# Patient Record
Sex: Female | Born: 1945
Health system: Southern US, Community
[De-identification: ages and names within clinical notes are randomized; demographics above are authoritative.]

## PROBLEM LIST (undated history)

## (undated) DIAGNOSIS — IMO0001 Reserved for inherently not codable concepts without codable children: Secondary | ICD-10-CM

## (undated) DIAGNOSIS — D649 Anemia, unspecified: Secondary | ICD-10-CM

## (undated) DIAGNOSIS — I10 Essential (primary) hypertension: Secondary | ICD-10-CM

## (undated) DIAGNOSIS — T7840XA Allergy, unspecified, initial encounter: Secondary | ICD-10-CM

## (undated) DIAGNOSIS — M3500 Sicca syndrome, unspecified: Secondary | ICD-10-CM

## (undated) DIAGNOSIS — E119 Type 2 diabetes mellitus without complications: Secondary | ICD-10-CM

## (undated) DIAGNOSIS — Z789 Other specified health status: Secondary | ICD-10-CM

## (undated) DIAGNOSIS — I350 Nonrheumatic aortic (valve) stenosis: Secondary | ICD-10-CM

## (undated) DIAGNOSIS — M1990 Unspecified osteoarthritis, unspecified site: Secondary | ICD-10-CM

## (undated) DIAGNOSIS — D369 Benign neoplasm, unspecified site: Secondary | ICD-10-CM

## (undated) DIAGNOSIS — R110 Nausea: Secondary | ICD-10-CM

## (undated) DIAGNOSIS — E079 Disorder of thyroid, unspecified: Secondary | ICD-10-CM

## (undated) HISTORY — PX: OTHER SURGICAL HISTORY: SHX169

## (undated) HISTORY — DX: Disorder of thyroid, unspecified: E07.9

## (undated) HISTORY — DX: Allergy, unspecified, initial encounter: T78.40XA

## (undated) HISTORY — DX: Benign neoplasm, unspecified site: D36.9

## (undated) HISTORY — DX: Nonrheumatic aortic (valve) stenosis: I35.0

## (undated) HISTORY — DX: Sjogren syndrome, unspecified: M35.00

## (undated) HISTORY — PX: THYROIDECTOMY: SHX17

## (undated) HISTORY — DX: Other specified health status: Z78.9

## (undated) HISTORY — DX: Type 2 diabetes mellitus without complications: E11.9

## (undated) HISTORY — PX: COLON SURGERY: SHX602

## (undated) HISTORY — DX: Anemia, unspecified: D64.9

## (undated) HISTORY — DX: Reserved for inherently not codable concepts without codable children: IMO0001

## (undated) HISTORY — DX: Nausea: R11.0

## (undated) HISTORY — PX: COLONOSCOPY: SHX174

---

## 2004-08-15 ENCOUNTER — Ambulatory Visit: Payer: Self-pay | Admitting: Internal Medicine

## 2004-09-03 ENCOUNTER — Ambulatory Visit: Payer: Self-pay | Admitting: Internal Medicine

## 2004-10-04 ENCOUNTER — Ambulatory Visit: Payer: Self-pay | Admitting: Internal Medicine

## 2004-11-20 HISTORY — PX: PARATHYROIDECTOMY: SHX19

## 2005-09-16 ENCOUNTER — Ambulatory Visit: Payer: Self-pay | Admitting: Internal Medicine

## 2007-01-22 ENCOUNTER — Ambulatory Visit: Payer: Self-pay | Admitting: Family Medicine

## 2007-07-15 ENCOUNTER — Ambulatory Visit: Payer: Self-pay | Admitting: Family Medicine

## 2010-10-24 ENCOUNTER — Ambulatory Visit: Payer: Self-pay

## 2011-08-19 DIAGNOSIS — R5383 Other fatigue: Secondary | ICD-10-CM | POA: Diagnosis not present

## 2011-08-19 DIAGNOSIS — E119 Type 2 diabetes mellitus without complications: Secondary | ICD-10-CM | POA: Diagnosis not present

## 2011-08-19 DIAGNOSIS — E785 Hyperlipidemia, unspecified: Secondary | ICD-10-CM | POA: Diagnosis not present

## 2011-08-19 DIAGNOSIS — I10 Essential (primary) hypertension: Secondary | ICD-10-CM | POA: Diagnosis not present

## 2011-08-20 DIAGNOSIS — I10 Essential (primary) hypertension: Secondary | ICD-10-CM | POA: Diagnosis not present

## 2011-08-20 DIAGNOSIS — R5383 Other fatigue: Secondary | ICD-10-CM | POA: Diagnosis not present

## 2011-08-20 DIAGNOSIS — E119 Type 2 diabetes mellitus without complications: Secondary | ICD-10-CM | POA: Diagnosis not present

## 2011-09-03 DIAGNOSIS — E785 Hyperlipidemia, unspecified: Secondary | ICD-10-CM | POA: Diagnosis not present

## 2011-10-02 DIAGNOSIS — E785 Hyperlipidemia, unspecified: Secondary | ICD-10-CM | POA: Diagnosis not present

## 2011-10-02 DIAGNOSIS — M1990 Unspecified osteoarthritis, unspecified site: Secondary | ICD-10-CM | POA: Diagnosis not present

## 2011-11-29 DIAGNOSIS — E119 Type 2 diabetes mellitus without complications: Secondary | ICD-10-CM | POA: Diagnosis not present

## 2011-12-03 DIAGNOSIS — I10 Essential (primary) hypertension: Secondary | ICD-10-CM | POA: Diagnosis not present

## 2011-12-03 DIAGNOSIS — E119 Type 2 diabetes mellitus without complications: Secondary | ICD-10-CM | POA: Diagnosis not present

## 2012-01-01 DIAGNOSIS — E119 Type 2 diabetes mellitus without complications: Secondary | ICD-10-CM | POA: Diagnosis not present

## 2012-01-01 DIAGNOSIS — M1990 Unspecified osteoarthritis, unspecified site: Secondary | ICD-10-CM | POA: Diagnosis not present

## 2012-01-01 DIAGNOSIS — I10 Essential (primary) hypertension: Secondary | ICD-10-CM | POA: Diagnosis not present

## 2012-02-19 DIAGNOSIS — Z23 Encounter for immunization: Secondary | ICD-10-CM | POA: Diagnosis not present

## 2012-03-19 DIAGNOSIS — I10 Essential (primary) hypertension: Secondary | ICD-10-CM | POA: Diagnosis not present

## 2012-03-19 DIAGNOSIS — E119 Type 2 diabetes mellitus without complications: Secondary | ICD-10-CM | POA: Diagnosis not present

## 2012-03-19 DIAGNOSIS — E7800 Pure hypercholesterolemia, unspecified: Secondary | ICD-10-CM | POA: Diagnosis not present

## 2012-03-31 DIAGNOSIS — M129 Arthropathy, unspecified: Secondary | ICD-10-CM | POA: Diagnosis not present

## 2012-03-31 DIAGNOSIS — E785 Hyperlipidemia, unspecified: Secondary | ICD-10-CM | POA: Diagnosis not present

## 2012-03-31 DIAGNOSIS — I10 Essential (primary) hypertension: Secondary | ICD-10-CM | POA: Diagnosis not present

## 2012-03-31 DIAGNOSIS — E119 Type 2 diabetes mellitus without complications: Secondary | ICD-10-CM | POA: Diagnosis not present

## 2012-06-29 DIAGNOSIS — E11329 Type 2 diabetes mellitus with mild nonproliferative diabetic retinopathy without macular edema: Secondary | ICD-10-CM | POA: Diagnosis not present

## 2012-07-01 DIAGNOSIS — Z86010 Personal history of colonic polyps: Secondary | ICD-10-CM | POA: Diagnosis not present

## 2012-07-01 DIAGNOSIS — E119 Type 2 diabetes mellitus without complications: Secondary | ICD-10-CM | POA: Diagnosis not present

## 2012-07-01 DIAGNOSIS — E785 Hyperlipidemia, unspecified: Secondary | ICD-10-CM | POA: Diagnosis not present

## 2012-07-01 DIAGNOSIS — I10 Essential (primary) hypertension: Secondary | ICD-10-CM | POA: Diagnosis not present

## 2012-07-01 DIAGNOSIS — R1310 Dysphagia, unspecified: Secondary | ICD-10-CM | POA: Diagnosis not present

## 2012-07-08 DIAGNOSIS — E119 Type 2 diabetes mellitus without complications: Secondary | ICD-10-CM | POA: Diagnosis not present

## 2012-07-08 DIAGNOSIS — I10 Essential (primary) hypertension: Secondary | ICD-10-CM | POA: Diagnosis not present

## 2012-07-08 DIAGNOSIS — R1310 Dysphagia, unspecified: Secondary | ICD-10-CM | POA: Diagnosis not present

## 2012-07-13 ENCOUNTER — Ambulatory Visit: Payer: Self-pay | Admitting: Unknown Physician Specialty

## 2012-07-13 DIAGNOSIS — Z880 Allergy status to penicillin: Secondary | ICD-10-CM | POA: Diagnosis not present

## 2012-07-13 DIAGNOSIS — J45909 Unspecified asthma, uncomplicated: Secondary | ICD-10-CM | POA: Diagnosis not present

## 2012-07-13 DIAGNOSIS — Z9889 Other specified postprocedural states: Secondary | ICD-10-CM | POA: Diagnosis not present

## 2012-07-13 DIAGNOSIS — K449 Diaphragmatic hernia without obstruction or gangrene: Secondary | ICD-10-CM | POA: Diagnosis not present

## 2012-07-13 DIAGNOSIS — R1310 Dysphagia, unspecified: Secondary | ICD-10-CM | POA: Diagnosis not present

## 2012-07-13 DIAGNOSIS — E119 Type 2 diabetes mellitus without complications: Secondary | ICD-10-CM | POA: Diagnosis not present

## 2012-07-13 DIAGNOSIS — Z79899 Other long term (current) drug therapy: Secondary | ICD-10-CM | POA: Diagnosis not present

## 2012-07-13 DIAGNOSIS — K219 Gastro-esophageal reflux disease without esophagitis: Secondary | ICD-10-CM | POA: Diagnosis not present

## 2012-07-13 DIAGNOSIS — I10 Essential (primary) hypertension: Secondary | ICD-10-CM | POA: Diagnosis not present

## 2012-07-15 LAB — PATHOLOGY REPORT

## 2012-08-05 DIAGNOSIS — E119 Type 2 diabetes mellitus without complications: Secondary | ICD-10-CM | POA: Diagnosis not present

## 2012-08-05 DIAGNOSIS — I10 Essential (primary) hypertension: Secondary | ICD-10-CM | POA: Diagnosis not present

## 2013-01-11 ENCOUNTER — Ambulatory Visit: Payer: Self-pay | Admitting: Unknown Physician Specialty

## 2013-01-11 DIAGNOSIS — Z880 Allergy status to penicillin: Secondary | ICD-10-CM | POA: Diagnosis not present

## 2013-01-11 DIAGNOSIS — E079 Disorder of thyroid, unspecified: Secondary | ICD-10-CM | POA: Diagnosis not present

## 2013-01-11 DIAGNOSIS — Z09 Encounter for follow-up examination after completed treatment for conditions other than malignant neoplasm: Secondary | ICD-10-CM | POA: Diagnosis not present

## 2013-01-11 DIAGNOSIS — Z86010 Personal history of colonic polyps: Secondary | ICD-10-CM | POA: Diagnosis not present

## 2013-01-11 DIAGNOSIS — Z85038 Personal history of other malignant neoplasm of large intestine: Secondary | ICD-10-CM | POA: Diagnosis not present

## 2013-01-11 DIAGNOSIS — Z6841 Body Mass Index (BMI) 40.0 and over, adult: Secondary | ICD-10-CM | POA: Diagnosis not present

## 2013-01-11 DIAGNOSIS — K648 Other hemorrhoids: Secondary | ICD-10-CM | POA: Diagnosis not present

## 2013-01-11 DIAGNOSIS — M1990 Unspecified osteoarthritis, unspecified site: Secondary | ICD-10-CM | POA: Diagnosis not present

## 2013-01-11 DIAGNOSIS — Z0389 Encounter for observation for other suspected diseases and conditions ruled out: Secondary | ICD-10-CM | POA: Diagnosis not present

## 2013-01-11 DIAGNOSIS — K5730 Diverticulosis of large intestine without perforation or abscess without bleeding: Secondary | ICD-10-CM | POA: Diagnosis not present

## 2013-01-11 DIAGNOSIS — Z87891 Personal history of nicotine dependence: Secondary | ICD-10-CM | POA: Diagnosis not present

## 2013-01-11 DIAGNOSIS — Z79899 Other long term (current) drug therapy: Secondary | ICD-10-CM | POA: Diagnosis not present

## 2013-01-11 DIAGNOSIS — I10 Essential (primary) hypertension: Secondary | ICD-10-CM | POA: Diagnosis not present

## 2013-01-11 DIAGNOSIS — J45909 Unspecified asthma, uncomplicated: Secondary | ICD-10-CM | POA: Diagnosis not present

## 2013-01-11 DIAGNOSIS — E7800 Pure hypercholesterolemia, unspecified: Secondary | ICD-10-CM | POA: Diagnosis not present

## 2013-01-11 LAB — HM COLONOSCOPY

## 2013-04-05 DIAGNOSIS — Z23 Encounter for immunization: Secondary | ICD-10-CM | POA: Diagnosis not present

## 2013-08-16 DIAGNOSIS — E119 Type 2 diabetes mellitus without complications: Secondary | ICD-10-CM | POA: Diagnosis not present

## 2013-08-16 DIAGNOSIS — I10 Essential (primary) hypertension: Secondary | ICD-10-CM | POA: Diagnosis not present

## 2013-08-16 DIAGNOSIS — E785 Hyperlipidemia, unspecified: Secondary | ICD-10-CM | POA: Diagnosis not present

## 2013-08-16 DIAGNOSIS — R1310 Dysphagia, unspecified: Secondary | ICD-10-CM | POA: Diagnosis not present

## 2014-02-09 DIAGNOSIS — Z23 Encounter for immunization: Secondary | ICD-10-CM | POA: Diagnosis not present

## 2014-04-13 ENCOUNTER — Ambulatory Visit: Payer: Self-pay | Admitting: Family Medicine

## 2014-04-13 DIAGNOSIS — Z1231 Encounter for screening mammogram for malignant neoplasm of breast: Secondary | ICD-10-CM | POA: Diagnosis not present

## 2014-04-26 DIAGNOSIS — Z008 Encounter for other general examination: Secondary | ICD-10-CM | POA: Diagnosis not present

## 2014-05-18 ENCOUNTER — Ambulatory Visit: Payer: Self-pay | Admitting: Family Medicine

## 2014-05-18 DIAGNOSIS — Z13820 Encounter for screening for osteoporosis: Secondary | ICD-10-CM | POA: Diagnosis not present

## 2014-05-30 DIAGNOSIS — E785 Hyperlipidemia, unspecified: Secondary | ICD-10-CM | POA: Diagnosis not present

## 2014-05-30 DIAGNOSIS — R1310 Dysphagia, unspecified: Secondary | ICD-10-CM | POA: Diagnosis not present

## 2014-05-30 DIAGNOSIS — Z1389 Encounter for screening for other disorder: Secondary | ICD-10-CM | POA: Diagnosis not present

## 2014-05-30 DIAGNOSIS — E119 Type 2 diabetes mellitus without complications: Secondary | ICD-10-CM | POA: Diagnosis not present

## 2014-05-30 DIAGNOSIS — I10 Essential (primary) hypertension: Secondary | ICD-10-CM | POA: Diagnosis not present

## 2014-05-30 DIAGNOSIS — Z9181 History of falling: Secondary | ICD-10-CM | POA: Diagnosis not present

## 2014-09-01 DIAGNOSIS — E785 Hyperlipidemia, unspecified: Secondary | ICD-10-CM | POA: Diagnosis not present

## 2014-09-01 DIAGNOSIS — K219 Gastro-esophageal reflux disease without esophagitis: Secondary | ICD-10-CM | POA: Diagnosis not present

## 2014-09-01 DIAGNOSIS — R6889 Other general symptoms and signs: Secondary | ICD-10-CM | POA: Diagnosis not present

## 2014-09-01 DIAGNOSIS — M1990 Unspecified osteoarthritis, unspecified site: Secondary | ICD-10-CM | POA: Diagnosis not present

## 2014-09-01 DIAGNOSIS — E119 Type 2 diabetes mellitus without complications: Secondary | ICD-10-CM | POA: Diagnosis not present

## 2014-09-01 DIAGNOSIS — I10 Essential (primary) hypertension: Secondary | ICD-10-CM | POA: Diagnosis not present

## 2014-09-01 LAB — HEMOGLOBIN A1C: Hgb A1c MFr Bld: 7 % — AB (ref 4.0–6.0)

## 2014-09-13 DIAGNOSIS — M25662 Stiffness of left knee, not elsewhere classified: Secondary | ICD-10-CM | POA: Diagnosis not present

## 2014-09-13 DIAGNOSIS — M25561 Pain in right knee: Secondary | ICD-10-CM | POA: Diagnosis not present

## 2014-09-13 DIAGNOSIS — M25661 Stiffness of right knee, not elsewhere classified: Secondary | ICD-10-CM | POA: Diagnosis not present

## 2014-09-13 DIAGNOSIS — M25562 Pain in left knee: Secondary | ICD-10-CM | POA: Diagnosis not present

## 2014-09-14 DIAGNOSIS — M25561 Pain in right knee: Secondary | ICD-10-CM | POA: Diagnosis not present

## 2014-09-14 DIAGNOSIS — M25562 Pain in left knee: Secondary | ICD-10-CM | POA: Diagnosis not present

## 2014-09-14 DIAGNOSIS — M25662 Stiffness of left knee, not elsewhere classified: Secondary | ICD-10-CM | POA: Diagnosis not present

## 2014-09-14 DIAGNOSIS — M25661 Stiffness of right knee, not elsewhere classified: Secondary | ICD-10-CM | POA: Diagnosis not present

## 2014-09-19 DIAGNOSIS — M25661 Stiffness of right knee, not elsewhere classified: Secondary | ICD-10-CM | POA: Diagnosis not present

## 2014-09-19 DIAGNOSIS — M25562 Pain in left knee: Secondary | ICD-10-CM | POA: Diagnosis not present

## 2014-09-19 DIAGNOSIS — E119 Type 2 diabetes mellitus without complications: Secondary | ICD-10-CM | POA: Diagnosis not present

## 2014-09-19 DIAGNOSIS — E785 Hyperlipidemia, unspecified: Secondary | ICD-10-CM | POA: Diagnosis not present

## 2014-09-19 DIAGNOSIS — M25561 Pain in right knee: Secondary | ICD-10-CM | POA: Diagnosis not present

## 2014-09-19 DIAGNOSIS — K219 Gastro-esophageal reflux disease without esophagitis: Secondary | ICD-10-CM | POA: Diagnosis not present

## 2014-09-19 DIAGNOSIS — M25662 Stiffness of left knee, not elsewhere classified: Secondary | ICD-10-CM | POA: Diagnosis not present

## 2014-09-19 LAB — TSH: TSH: 2.43 u[IU]/mL (ref <=5.90)

## 2014-09-19 LAB — CBC AND DIFFERENTIAL
HCT: 30 % — AB (ref 36–46)
Hemoglobin: 9 g/dL — AB (ref 12.0–16.0)
Neutrophils Absolute: 2 /uL
Platelets: 335 10*3/uL (ref 150–399)
WBC: 3.8 10^3/mL

## 2014-09-19 LAB — LIPID PANEL
Cholesterol: 180 mg/dL (ref 0–200)
HDL: 63 mg/dL (ref 35–70)
LDL CALC: 97 mg/dL
Triglycerides: 98 mg/dL (ref 40–160)

## 2014-09-19 LAB — HEPATIC FUNCTION PANEL
ALK PHOS: 69 U/L (ref 25–125)
ALT: 44 U/L — AB (ref 7–35)
AST: 73 U/L — AB (ref 13–35)
Bilirubin, Total: 0.3 mg/dL

## 2014-09-19 LAB — BASIC METABOLIC PANEL
BUN: 19 mg/dL (ref 4–21)
Creatinine: 0.9 mg/dL (ref <=1.1)
GLUCOSE: 204 mg/dL
POTASSIUM: 4.5 mmol/L (ref 3.4–5.3)

## 2014-09-21 DIAGNOSIS — M25562 Pain in left knee: Secondary | ICD-10-CM | POA: Diagnosis not present

## 2014-09-21 DIAGNOSIS — M25561 Pain in right knee: Secondary | ICD-10-CM | POA: Diagnosis not present

## 2014-09-21 DIAGNOSIS — M25661 Stiffness of right knee, not elsewhere classified: Secondary | ICD-10-CM | POA: Diagnosis not present

## 2014-09-21 DIAGNOSIS — M25662 Stiffness of left knee, not elsewhere classified: Secondary | ICD-10-CM | POA: Diagnosis not present

## 2014-09-23 LAB — FECAL OCCULT BLOOD, GUAIAC: FECAL OCCULT BLD: NEGATIVE

## 2014-10-05 DIAGNOSIS — M25661 Stiffness of right knee, not elsewhere classified: Secondary | ICD-10-CM | POA: Diagnosis not present

## 2014-10-05 DIAGNOSIS — M25662 Stiffness of left knee, not elsewhere classified: Secondary | ICD-10-CM | POA: Diagnosis not present

## 2014-10-05 DIAGNOSIS — R945 Abnormal results of liver function studies: Secondary | ICD-10-CM | POA: Diagnosis not present

## 2014-10-05 DIAGNOSIS — M25562 Pain in left knee: Secondary | ICD-10-CM | POA: Diagnosis not present

## 2014-10-05 DIAGNOSIS — R748 Abnormal levels of other serum enzymes: Secondary | ICD-10-CM | POA: Diagnosis not present

## 2014-10-05 DIAGNOSIS — M25561 Pain in right knee: Secondary | ICD-10-CM | POA: Diagnosis not present

## 2014-10-07 ENCOUNTER — Telehealth: Payer: Self-pay

## 2014-10-07 NOTE — Telephone Encounter (Signed)
Advised Hep Panel Negative. Patient doing great.

## 2014-11-04 DIAGNOSIS — M25662 Stiffness of left knee, not elsewhere classified: Secondary | ICD-10-CM | POA: Diagnosis not present

## 2014-11-04 DIAGNOSIS — M25661 Stiffness of right knee, not elsewhere classified: Secondary | ICD-10-CM | POA: Diagnosis not present

## 2014-11-04 DIAGNOSIS — M25562 Pain in left knee: Secondary | ICD-10-CM | POA: Diagnosis not present

## 2014-11-04 DIAGNOSIS — M25561 Pain in right knee: Secondary | ICD-10-CM | POA: Diagnosis not present

## 2014-11-09 DIAGNOSIS — M25561 Pain in right knee: Secondary | ICD-10-CM | POA: Diagnosis not present

## 2014-11-09 DIAGNOSIS — M25662 Stiffness of left knee, not elsewhere classified: Secondary | ICD-10-CM | POA: Diagnosis not present

## 2014-11-09 DIAGNOSIS — M25562 Pain in left knee: Secondary | ICD-10-CM | POA: Diagnosis not present

## 2014-11-09 DIAGNOSIS — M25661 Stiffness of right knee, not elsewhere classified: Secondary | ICD-10-CM | POA: Diagnosis not present

## 2014-11-18 DIAGNOSIS — M25661 Stiffness of right knee, not elsewhere classified: Secondary | ICD-10-CM | POA: Diagnosis not present

## 2014-11-18 DIAGNOSIS — M25662 Stiffness of left knee, not elsewhere classified: Secondary | ICD-10-CM | POA: Diagnosis not present

## 2014-11-18 DIAGNOSIS — M25562 Pain in left knee: Secondary | ICD-10-CM | POA: Diagnosis not present

## 2014-11-18 DIAGNOSIS — M25561 Pain in right knee: Secondary | ICD-10-CM | POA: Diagnosis not present

## 2014-11-23 DIAGNOSIS — M25562 Pain in left knee: Secondary | ICD-10-CM | POA: Diagnosis not present

## 2014-11-23 DIAGNOSIS — M25661 Stiffness of right knee, not elsewhere classified: Secondary | ICD-10-CM | POA: Diagnosis not present

## 2014-11-23 DIAGNOSIS — M25662 Stiffness of left knee, not elsewhere classified: Secondary | ICD-10-CM | POA: Diagnosis not present

## 2014-11-23 DIAGNOSIS — M25561 Pain in right knee: Secondary | ICD-10-CM | POA: Diagnosis not present

## 2014-11-25 DIAGNOSIS — M25661 Stiffness of right knee, not elsewhere classified: Secondary | ICD-10-CM | POA: Diagnosis not present

## 2014-11-25 DIAGNOSIS — M25662 Stiffness of left knee, not elsewhere classified: Secondary | ICD-10-CM | POA: Diagnosis not present

## 2014-11-25 DIAGNOSIS — M25562 Pain in left knee: Secondary | ICD-10-CM | POA: Diagnosis not present

## 2014-11-25 DIAGNOSIS — M25561 Pain in right knee: Secondary | ICD-10-CM | POA: Diagnosis not present

## 2014-12-05 ENCOUNTER — Ambulatory Visit (INDEPENDENT_AMBULATORY_CARE_PROVIDER_SITE_OTHER): Payer: Medicare Other | Admitting: Family Medicine

## 2014-12-05 ENCOUNTER — Encounter: Payer: Self-pay | Admitting: Family Medicine

## 2014-12-05 VITALS — BP 125/71 | HR 85 | Resp 16 | Ht 61.0 in | Wt 230.8 lb

## 2014-12-05 DIAGNOSIS — K219 Gastro-esophageal reflux disease without esophagitis: Secondary | ICD-10-CM

## 2014-12-05 DIAGNOSIS — E7800 Pure hypercholesterolemia, unspecified: Secondary | ICD-10-CM

## 2014-12-05 DIAGNOSIS — D126 Benign neoplasm of colon, unspecified: Secondary | ICD-10-CM | POA: Insufficient documentation

## 2014-12-05 DIAGNOSIS — D649 Anemia, unspecified: Secondary | ICD-10-CM

## 2014-12-05 DIAGNOSIS — E119 Type 2 diabetes mellitus without complications: Secondary | ICD-10-CM

## 2014-12-05 DIAGNOSIS — K222 Esophageal obstruction: Secondary | ICD-10-CM | POA: Diagnosis not present

## 2014-12-05 DIAGNOSIS — M170 Bilateral primary osteoarthritis of knee: Secondary | ICD-10-CM | POA: Insufficient documentation

## 2014-12-05 DIAGNOSIS — M1990 Unspecified osteoarthritis, unspecified site: Secondary | ICD-10-CM | POA: Insufficient documentation

## 2014-12-05 DIAGNOSIS — I10 Essential (primary) hypertension: Secondary | ICD-10-CM

## 2014-12-05 DIAGNOSIS — R748 Abnormal levels of other serum enzymes: Secondary | ICD-10-CM | POA: Insufficient documentation

## 2014-12-05 DIAGNOSIS — D6489 Other specified anemias: Secondary | ICD-10-CM

## 2014-12-05 DIAGNOSIS — E785 Hyperlipidemia, unspecified: Secondary | ICD-10-CM

## 2014-12-05 DIAGNOSIS — Z6841 Body Mass Index (BMI) 40.0 and over, adult: Secondary | ICD-10-CM

## 2014-12-05 DIAGNOSIS — E1169 Type 2 diabetes mellitus with other specified complication: Secondary | ICD-10-CM | POA: Insufficient documentation

## 2014-12-05 LAB — POCT GLYCOSYLATED HEMOGLOBIN (HGB A1C): Hemoglobin A1C: 6.8

## 2014-12-05 MED ORDER — SIMVASTATIN 10 MG PO TABS: 10.0000 mg | ORAL_TABLET | Freq: Every day | ORAL | Status: DC

## 2014-12-05 MED ORDER — LOSARTAN POTASSIUM 50 MG PO TABS: 50.0000 mg | ORAL_TABLET | Freq: Every day | ORAL | Status: DC

## 2014-12-05 MED ORDER — OMEPRAZOLE 20 MG PO TBEC: 20.0000 mg | DELAYED_RELEASE_TABLET | Freq: Every morning | ORAL | Status: DC

## 2014-12-05 MED ORDER — METFORMIN HCL 500 MG PO TABS: 1000.0000 mg | ORAL_TABLET | Freq: Two times a day (BID) | ORAL | Status: DC

## 2014-12-05 MED ORDER — HYDROCHLOROTHIAZIDE 12.5 MG PO TABS: 12.5000 mg | ORAL_TABLET | Freq: Every day | ORAL | Status: DC

## 2014-12-05 MED ORDER — GLIMEPIRIDE 2 MG PO TABS: 2.0000 mg | ORAL_TABLET | Freq: Every day | ORAL | Status: DC

## 2014-12-05 NOTE — Progress Notes (Signed)
Name: Diana Harmon   MRN: [id number]    DOB: 05-24-1945   Date:12/05/2014       Progress Note  Subjective  Chief Complaint  Chief Complaint  Patient presents with  . Diabetes    sugars 110-160  . Hypertension  . Gastrophageal Reflux    HPI  Here for f/u of DM, HBP, elevated lipids, anemia, elevated liver enzymes. She is feeling well No c/o.   Past Medical History  Diagnosis Date  . Thyroid disease   . Benign tumor     Past Surgical History  Procedure Laterality Date  . Tumor removed    . Thyroidectomy      Family History  Problem Relation Age of Onset  . Parkinson's disease Mother   . Heart disease Father     History   Social History  . Marital Status: Widowed    Spouse Name: N/A  . Number of Children: N/A  . Years of Education: N/A   Occupational History  . Not on file.   Social History Main Topics  . Smoking status: Former Smoker    Quit date: 12/05/1979  . Smokeless tobacco: Never Used  . Alcohol Use: No  . Drug Use: No  . Sexual Activity: Not on file   Other Topics Concern  . Not on file   Social History Narrative  . No narrative on file     Current outpatient prescriptions:  .  glimepiride (AMARYL) 4 MG tablet, Take 4 mg by mouth daily with breakfast. Take 1/2 tablet each morn ing with breakfast., Disp: , Rfl:  .  hydrochlorothiazide (HYDRODIURIL) 12.5 MG tablet, Take by mouth., Disp: , Rfl:  .  losartan (COZAAR) 50 MG tablet, Take by mouth., Disp: , Rfl:  .  metFORMIN (GLUCOPHAGE) 500 MG tablet, Take by mouth., Disp: , Rfl:  .  Omeprazole 20 MG TBEC, Take by mouth., Disp: , Rfl:  .  simvastatin (ZOCOR) 10 MG tablet, Take by mouth., Disp: , Rfl:   Allergies  Allergen Reactions  . Penicillins Rash, Swelling and Hives     Review of Systems  Constitutional: Negative for fever, chills, weight loss and malaise/fatigue.  Eyes: Negative for blurred vision and double vision.  Respiratory: Negative for cough, sputum production,  shortness of breath and wheezing.   Cardiovascular: Positive for leg swelling. Negative for chest pain, palpitations and orthopnea.  Gastrointestinal: Negative for heartburn, nausea, vomiting, abdominal pain, diarrhea and blood in stool.  Genitourinary: Negative for dysuria, urgency and frequency.  Musculoskeletal: Positive for joint pain (knees, hips and back). Negative for myalgias.  Skin: Negative for rash.  Neurological: Negative for dizziness, tremors, sensory change, focal weakness, weakness and headaches.  Psychiatric/Behavioral: Negative for depression. The patient is not nervous/anxious and does not have insomnia.       Objective  Filed Vitals:   12/05/14 1058  BP: 125/71  Pulse: 85  Resp: 16  Height: 5\' 1"  (1.549 m)  Weight: 230 lb 12.8 oz (104.69 kg)    Physical Exam  Constitutional: She is oriented to person, place, and time and well-developed, well-nourished, and in no distress.  HENT:  Head: Normocephalic and atraumatic.  Eyes: Conjunctivae and EOM are normal. Pupils are equal, round, and reactive to light. No scleral icterus.  Neck: Normal range of motion. Neck supple. No thyromegaly present.  Cardiovascular: Normal rate, regular rhythm, normal heart sounds and intact distal pulses.  Exam reveals no gallop and no friction rub.   No murmur heard. Pulmonary/Chest: Effort normal  and breath sounds normal. No respiratory distress. She has no wheezes. She has no rales.  Abdominal: Soft. Bowel sounds are normal. She exhibits no distension and no mass. There is no tenderness.  Musculoskeletal: Normal range of motion. She exhibits edema (1+ edema of ankle.).  Lymphadenopathy:    She has no cervical adenopathy.  Neurological: She is alert and oriented to person, place, and time.  Skin: Skin is warm and dry.  Vitals reviewed.   Diabetic Foot Exam - Simple   Simple Foot Form  Diabetic Foot exam was performed with the following findings:  Yes 12/05/2014 11:45 AM  Visual  Inspection  No deformities, no ulcerations, no other skin breakdown bilaterally:  Yes  Sensation Testing  Intact to touch and monofilament testing bilaterally:  Yes  See comments:  Yes  Pulse Check  Posterior Tibialis and Dorsalis pulse intact bilaterally:  Yes  Comments  Pin prick sensation slightly decreased in L. Foot only       Recent Results (from the past 2160 hour(s))  CBC and differential     Status: Abnormal   Collection Time: 09/19/14 12:00 AM  Result Value Ref Range   Hemoglobin 9.0 (A) 12.0 - 16.0 g/dL   HCT 30 (A) 36 - 46 %   Neutrophils Absolute 2 /L   Platelets 335 150 - 399 K/L   WBC 3.8 34^1/PF  Basic metabolic panel     Status: None   Collection Time: 09/19/14 12:00 AM  Result Value Ref Range   Glucose 204 mg/dL   BUN 19 4 - 21 mg/dL   Creatinine 0.9 .5 - 1.1 mg/dL   Potassium 4.5 3.4 - 5.3 mmol/L  Lipid panel     Status: None   Collection Time: 09/19/14 12:00 AM  Result Value Ref Range   Triglycerides 98 40 - 160 mg/dL   Cholesterol 180 0 - 200 mg/dL   HDL 63 35 - 70 mg/dL   LDL Cholesterol 97 mg/dL  Hepatic function panel     Status: Abnormal   Collection Time: 09/19/14 12:00 AM  Result Value Ref Range   Alkaline Phosphatase 69 25 - 125 U/L   ALT 44 (A) 7 - 35 U/L   AST 73 (A) 13 - 35 U/L   Bilirubin, Total 0.3 mg/dL  TSH     Status: None   Collection Time: 09/19/14 12:00 AM  Result Value Ref Range   TSH 2.43 .41 - 5.90 uIU/mL  Fecal Occult Blood, Guaiac     Status: None   Collection Time: 09/23/14 12:00 AM  Result Value Ref Range   Fecal Occult Blood Negative   POCT HgB A1C     Status: Normal   Collection Time: 12/05/14 11:31 AM  Result Value Ref Range   Hemoglobin A1C 6.8      Assessment & Plan  Problem List Items Addressed This Visit      Cardiovascular and Mediastinum   Essential (primary) hypertension     Digestive   Benign neoplasm of colon     Endocrine   Diabetes mellitus, type 2 - Primary   Relevant Medications    glimepiride (AMARYL) 4 MG tablet   Other Relevant Orders   POCT HgB A1C (Completed)   HM Diabetes Foot Exam (Completed)     Other   Hypercholesteremia      Meds ordered this encounter  Medications  . DISCONTD: amLODipine (NORVASC) 10 MG tablet    Sig: Take by mouth.  . DISCONTD: atenolol (TENORMIN) 50  MG tablet    Sig: Take by mouth.  . DISCONTD: glipiZIDE (GLUCOTROL) 10 MG tablet    Sig: Take by mouth.  . DISCONTD: lisinopril (PRINIVIL,ZESTRIL) 40 MG tablet    Sig: Take by mouth.  . DISCONTD: lovastatin (MEVACOR) 40 MG tablet    Sig: Take by mouth.  . DISCONTD: traMADol (ULTRAM) 50 MG tablet    Sig: Take by mouth.  Marland Kitchen glimepiride (AMARYL) 4 MG tablet    Sig: Take 4 mg by mouth daily with breakfast. Take 1/2 tablet each morn ing with breakfast.    1. Type 2 diabetes mellitus without complication  - POCT HgB A1C - HM Diabetes Foot Exam - glimepiride (AMARYL) 2 MG tablet; Take 1 tablet (2 mg total) by mouth daily before breakfast.  Dispense: 90 tablet; Refill: 3 - metFORMIN (GLUCOPHAGE) 500 MG tablet; Take 2 tablets (1,000 mg total) by mouth 2 (two) times daily with a meal.  Dispense: 180 tablet; Refill: 3  2. Essential (primary) hypertension  - hydrochlorothiazide (HYDRODIURIL) 12.5 MG tablet; Take 1 tablet (12.5 mg total) by mouth daily.  Dispense: 90 tablet; Refill: 3 - losartan (COZAAR) 50 MG tablet; Take 1 tablet (50 mg total) by mouth daily.  Dispense: 90 tablet; Refill: 3  3. Benign neoplasm of colon   4. Hypercholesteremia  - simvastatin (ZOCOR) 10 MG tablet; Take 1 tablet (10 mg total) by mouth daily at 6 PM.  Dispense: 90 tablet; Refill: 3  5. Anemia due to multiple mechanisms  - CBC with Differential - Iron - Iron Binding Cap (TIBC) - Ferritin - B12 - Folate  6. Abnormal liver enzymes  - Hepatic function panel  7. GERD with stricture  - Omeprazole 20 MG TBEC; Take 1 tablet (20 mg total) by mouth every morning.  Dispense: 90 each; Refill: 3

## 2014-12-06 DIAGNOSIS — R748 Abnormal levels of other serum enzymes: Secondary | ICD-10-CM | POA: Diagnosis not present

## 2014-12-06 DIAGNOSIS — D649 Anemia, unspecified: Secondary | ICD-10-CM | POA: Diagnosis not present

## 2014-12-06 LAB — CBC WITH DIFFERENTIAL/PLATELET
BASOS ABS: 0 10*3/uL (ref 0.0–0.2)
Basos: 0 %
EOS (ABSOLUTE): 0.1 10*3/uL (ref 0.0–0.4)
EOS: 3 %
HEMATOCRIT: 28.9 % — AB (ref 34.0–46.6)
Hemoglobin: 9 g/dL — ABNORMAL LOW (ref 11.1–15.9)
IMMATURE GRANS (ABS): 0 10*3/uL (ref 0.0–0.1)
Immature Granulocytes: 0 %
LYMPHS: 38 %
Lymphocytes Absolute: 1.6 10*3/uL (ref 0.7–3.1)
MCH: 24.5 pg — ABNORMAL LOW (ref 26.6–33.0)
MCHC: 31.1 g/dL — AB (ref 31.5–35.7)
MCV: 79 fL (ref 79–97)
MONOCYTES: 12 %
Monocytes Absolute: 0.5 10*3/uL (ref 0.1–0.9)
Neutrophils Absolute: 2 10*3/uL (ref 1.4–7.0)
Neutrophils: 47 %
PLATELETS: 321 10*3/uL (ref 150–379)
RBC: 3.67 x10E6/uL — ABNORMAL LOW (ref 3.77–5.28)
RDW: 17.9 % — ABNORMAL HIGH (ref 12.3–15.4)
WBC: 4.3 10*3/uL (ref 3.4–10.8)

## 2014-12-07 LAB — IRON AND TIBC
IRON: 37 ug/dL (ref 27–139)
Iron Saturation: 8 % — CL (ref 15–55)
Total Iron Binding Capacity: 453 ug/dL — ABNORMAL HIGH (ref 250–450)
UIBC: 416 ug/dL — AB (ref 118–369)

## 2014-12-07 LAB — HEPATIC FUNCTION PANEL
ALBUMIN: 3.9 g/dL (ref 3.6–4.8)
ALT: 43 IU/L — ABNORMAL HIGH (ref 0–32)
AST: 64 IU/L — AB (ref 0–40)
Alkaline Phosphatase: 64 IU/L (ref 39–117)
BILIRUBIN, DIRECT: 0.15 mg/dL (ref 0.00–0.40)
Bilirubin Total: 0.3 mg/dL (ref 0.0–1.2)
TOTAL PROTEIN: 8.2 g/dL (ref 6.0–8.5)

## 2014-12-07 LAB — FOLATE: FOLATE: 10.2 ng/mL (ref >3.0)

## 2014-12-07 LAB — VITAMIN B12: VITAMIN B 12: 678 pg/mL (ref 211–946)

## 2014-12-07 LAB — FERRITIN: FERRITIN: 23 ng/mL (ref 15–150)

## 2014-12-08 ENCOUNTER — Telehealth: Payer: Self-pay

## 2014-12-08 NOTE — Progress Notes (Signed)
ADVISED/ Referral will be sent.[facility]

## 2014-12-14 NOTE — Telephone Encounter (Signed)
Patient wants me to call before scheduling her referral. Note on clip board.[facility]

## 2015-01-05 ENCOUNTER — Ambulatory Visit (INDEPENDENT_AMBULATORY_CARE_PROVIDER_SITE_OTHER): Payer: Medicare Other | Admitting: Family Medicine

## 2015-01-05 ENCOUNTER — Encounter: Payer: Self-pay | Admitting: Family Medicine

## 2015-01-05 VITALS — BP 117/71 | HR 84 | Temp 98.3°F | Resp 16 | Ht 61.0 in | Wt 231.6 lb

## 2015-01-05 DIAGNOSIS — I10 Essential (primary) hypertension: Secondary | ICD-10-CM

## 2015-01-05 DIAGNOSIS — D649 Anemia, unspecified: Secondary | ICD-10-CM | POA: Diagnosis not present

## 2015-01-05 DIAGNOSIS — E119 Type 2 diabetes mellitus without complications: Secondary | ICD-10-CM | POA: Diagnosis not present

## 2015-01-05 DIAGNOSIS — Z23 Encounter for immunization: Secondary | ICD-10-CM

## 2015-01-05 DIAGNOSIS — D6489 Other specified anemias: Secondary | ICD-10-CM

## 2015-01-05 MED ORDER — FERROUS SULFATE 325 (65 FE) MG PO TABS: 325.0000 mg | ORAL_TABLET | Freq: Two times a day (BID) | ORAL | Status: DC

## 2015-01-05 NOTE — Progress Notes (Signed)
Name: Diana Harmon   MRN: [id number]    DOB: [birth year]   Date:01/05/2015       Progress Note  Subjective  Chief Complaint  Chief Complaint  Patient presents with  . Diabetes    1 month follow up highest BS 147 and lowest 114 and avarage 120 she was thirsty and dry and lips were cracking 15 days ago but is improving now     HPI Here for f/u of DM, HBP, anemia.  Needs appt. With Dr. Vira Agar.  BSs at home running 115-145.  She is exercising almost daily.  Trying to eat better.   No problem-specific assessment & plan notes found for this encounter.   Past Medical History  Diagnosis Date  . Thyroid disease   . Benign tumor     Social History  Substance Use Topics  . Smoking status: Former Smoker    Quit date: 12/05/1979  . Smokeless tobacco: Never Used  . Alcohol Use: No     Current outpatient prescriptions:  .  glimepiride (AMARYL) 2 MG tablet, Take 1 tablet (2 mg total) by mouth daily before breakfast., Disp: 90 tablet, Rfl: 3 .  hydrochlorothiazide (HYDRODIURIL) 12.5 MG tablet, Take 1 tablet (12.5 mg total) by mouth daily., Disp: 90 tablet, Rfl: 3 .  losartan (COZAAR) 50 MG tablet, Take 1 tablet (50 mg total) by mouth daily., Disp: 90 tablet, Rfl: 3 .  metFORMIN (GLUCOPHAGE) 500 MG tablet, Take 2 tablets (1,000 mg total) by mouth 2 (two) times daily with a meal., Disp: 180 tablet, Rfl: 3 .  Omeprazole 20 MG TBEC, Take 1 tablet (20 mg total) by mouth every morning., Disp: 90 each, Rfl: 3 .  simvastatin (ZOCOR) 10 MG tablet, Take 1 tablet (10 mg total) by mouth daily at 6 PM., Disp: 90 tablet, Rfl: 3 .  ferrous sulfate 325 (65 FE) MG tablet, Take 1 tablet (325 mg total) by mouth 2 (two) times daily with a meal., Disp: 60 tablet, Rfl: 6  Allergies  Allergen Reactions  . Penicillins Rash, Swelling and Hives    Review of Systems  Constitutional: Positive for malaise/fatigue. Negative for fever, chills and weight loss.  HENT: Negative for hearing loss.   Eyes:  Negative for blurred vision and double vision.  Respiratory: Negative for cough, sputum production, shortness of breath and wheezing.   Cardiovascular: Negative for chest pain, palpitations, orthopnea and leg swelling.  Gastrointestinal: Negative for heartburn, nausea, vomiting, abdominal pain, diarrhea and blood in stool.  Genitourinary: Negative for dysuria, urgency and frequency.  Musculoskeletal: Negative for myalgias and joint pain.  Skin: Negative for rash.  Neurological: Negative for dizziness, sensory change, focal weakness, weakness and headaches.      Objective  Filed Vitals:   01/05/15 0807  BP: 117/71  Pulse: 84  Temp: 98.3 F (36.8 C)  TempSrc: Oral  Resp: 16  Height: 5\' 1"  (1.549 m)  Weight: 231 lb 9.6 oz (105.053 kg)     Physical Exam  Constitutional: She is oriented to person, place, and time and well-developed, well-nourished, and in no distress. No distress.  HENT:  Head: Normocephalic and atraumatic.  Eyes: Conjunctivae and EOM are normal. Pupils are equal, round, and reactive to light. No scleral icterus.  Neck: Normal range of motion. Neck supple. Carotid bruit is not present. No thyromegaly present.  Cardiovascular: Normal rate, regular rhythm and intact distal pulses.  Exam reveals no gallop and no friction rub.   Murmur heard.  Systolic murmur is present with  a grade of 3/6  URSB  Pulmonary/Chest: Effort normal and breath sounds normal. No respiratory distress. She has no wheezes. She has no rales.  Abdominal: She exhibits no distension and no mass. There is no tenderness.  Musculoskeletal: She exhibits edema (trace edema bilaterally (feet/ankles).).  Lymphadenopathy:    She has no cervical adenopathy.  Neurological: She is alert and oriented to person, place, and time.      Recent Results (from the past 2160 hour(s))  POCT HgB A1C     Status: Normal   Collection Time: 12/05/14 11:31 AM  Result Value Ref Range   Hemoglobin A1C 6.8   CBC with  Differential     Status: Abnormal   Collection Time: 12/06/14  8:46 AM  Result Value Ref Range   WBC 4.3 3.4 - 10.8 x10E3/uL   RBC 3.67 (L) 3.77 - 5.28 x10E6/uL   Hemoglobin 9.0 (L) 11.1 - 15.9 g/dL   Hematocrit 28.9 (L) 34.0 - 46.6 %   MCV 79 79 - 97 fL   MCH 24.5 (L) 26.6 - 33.0 pg   MCHC 31.1 (L) 31.5 - 35.7 g/dL   RDW 17.9 (H) 12.3 - 15.4 %   Platelets 321 150 - 379 x10E3/uL   Neutrophils 47 %   Lymphs 38 %   Monocytes 12 %   Eos 3 %   Basos 0 %   Neutrophils Absolute 2.0 1.4 - 7.0 x10E3/uL   Lymphocytes Absolute 1.6 0.7 - 3.1 x10E3/uL   Monocytes Absolute 0.5 0.1 - 0.9 x10E3/uL   EOS (ABSOLUTE) 0.1 0.0 - 0.4 x10E3/uL   Basophils Absolute 0.0 0.0 - 0.2 x10E3/uL   Immature Granulocytes 0 %   Immature Grans (Abs) 0.0 0.0 - 0.1 x10E3/uL  Iron Binding Cap (TIBC)     Status: Abnormal   Collection Time: 12/06/14  8:46 AM  Result Value Ref Range   Total Iron Binding Capacity 453 (H) 250 - 450 ug/dL   UIBC 416 (H) 118 - 369 ug/dL   Iron 37 27 - 139 ug/dL   Iron Saturation 8 (LL) 15 - 55 %  Ferritin     Status: None   Collection Time: 12/06/14  8:46 AM  Result Value Ref Range   Ferritin 23 15 - 150 ng/mL  B12     Status: None   Collection Time: 12/06/14  8:46 AM  Result Value Ref Range   Vitamin B-12 678 211 - 946 pg/mL  Folate     Status: None   Collection Time: 12/06/14  8:46 AM  Result Value Ref Range   Folate 10.2 >3.0 ng/mL  Hepatic function panel     Status: Abnormal   Collection Time: 12/06/14  8:46 AM  Result Value Ref Range   Total Protein 8.2 6.0 - 8.5 g/dL   Albumin 3.9 3.6 - 4.8 g/dL   Bilirubin Total 0.3 0.0 - 1.2 mg/dL   Bilirubin, Direct 0.15 0.00 - 0.40 mg/dL   Alkaline Phosphatase 64 39 - 117 IU/L   AST 64 (H) 0 - 40 IU/L   ALT 43 (H) 0 - 32 IU/L     Assessment & Plan  1. Essential (primary) hypertension   2. Type 2 diabetes mellitus without complication   3. Anemia due to multiple mechanisms  - Ambulatory referral to Gastroenterology -  ferrous sulfate 325 (65 FE) MG tablet; Take 1 tablet (325 mg total) by mouth 2 (two) times daily with a meal.  Dispense: 60 tablet; Refill: 6  4. Need for  influenza vaccination  - Flu vaccine HIGH DOSE PF (Fluzone High dose)

## 2015-01-05 NOTE — Patient Instructions (Addendum)
Continue current meds.  Cont. To exercise.  Try to lose some weight.  See Dr. Vira Agar.  Start Iron pills, twice a day.

## 2015-02-13 ENCOUNTER — Other Ambulatory Visit: Payer: Self-pay | Admitting: Family Medicine

## 2015-02-13 DIAGNOSIS — R110 Nausea: Secondary | ICD-10-CM | POA: Diagnosis not present

## 2015-02-13 DIAGNOSIS — Z86010 Personal history of colonic polyps: Secondary | ICD-10-CM | POA: Insufficient documentation

## 2015-02-13 DIAGNOSIS — E119 Type 2 diabetes mellitus without complications: Secondary | ICD-10-CM

## 2015-02-13 DIAGNOSIS — D509 Iron deficiency anemia, unspecified: Secondary | ICD-10-CM | POA: Diagnosis not present

## 2015-02-13 DIAGNOSIS — Z87898 Personal history of other specified conditions: Secondary | ICD-10-CM | POA: Diagnosis not present

## 2015-02-13 DIAGNOSIS — Z860101 Personal history of adenomatous and serrated colon polyps: Secondary | ICD-10-CM | POA: Insufficient documentation

## 2015-02-13 DIAGNOSIS — K591 Functional diarrhea: Secondary | ICD-10-CM | POA: Diagnosis not present

## 2015-02-13 MED ORDER — METFORMIN HCL 500 MG PO TABS: 1000.0000 mg | ORAL_TABLET | Freq: Two times a day (BID) | ORAL | Status: DC

## 2015-02-15 DIAGNOSIS — D509 Iron deficiency anemia, unspecified: Secondary | ICD-10-CM | POA: Diagnosis not present

## 2015-02-15 DIAGNOSIS — K591 Functional diarrhea: Secondary | ICD-10-CM | POA: Diagnosis not present

## 2015-02-15 DIAGNOSIS — Z87898 Personal history of other specified conditions: Secondary | ICD-10-CM | POA: Diagnosis not present

## 2015-02-15 DIAGNOSIS — R110 Nausea: Secondary | ICD-10-CM | POA: Diagnosis not present

## 2015-02-20 DIAGNOSIS — R110 Nausea: Secondary | ICD-10-CM | POA: Diagnosis not present

## 2015-02-28 ENCOUNTER — Inpatient Hospital Stay: Payer: Medicare Other | Attending: Internal Medicine | Admitting: Internal Medicine

## 2015-02-28 ENCOUNTER — Encounter: Payer: Self-pay | Admitting: Internal Medicine

## 2015-02-28 ENCOUNTER — Inpatient Hospital Stay: Payer: Medicare Other

## 2015-02-28 VITALS — BP 157/70 | HR 85 | Temp 98.0°F | Resp 18 | Ht 61.0 in | Wt 229.5 lb

## 2015-02-28 DIAGNOSIS — E079 Disorder of thyroid, unspecified: Secondary | ICD-10-CM | POA: Insufficient documentation

## 2015-02-28 DIAGNOSIS — Z85038 Personal history of other malignant neoplasm of large intestine: Secondary | ICD-10-CM | POA: Insufficient documentation

## 2015-02-28 DIAGNOSIS — D509 Iron deficiency anemia, unspecified: Secondary | ICD-10-CM | POA: Diagnosis not present

## 2015-02-28 DIAGNOSIS — D500 Iron deficiency anemia secondary to blood loss (chronic): Secondary | ICD-10-CM | POA: Insufficient documentation

## 2015-02-28 DIAGNOSIS — R110 Nausea: Secondary | ICD-10-CM | POA: Insufficient documentation

## 2015-02-28 DIAGNOSIS — M129 Arthropathy, unspecified: Secondary | ICD-10-CM | POA: Diagnosis not present

## 2015-02-28 DIAGNOSIS — R197 Diarrhea, unspecified: Secondary | ICD-10-CM | POA: Insufficient documentation

## 2015-02-28 DIAGNOSIS — D508 Other iron deficiency anemias: Secondary | ICD-10-CM

## 2015-02-28 DIAGNOSIS — E669 Obesity, unspecified: Secondary | ICD-10-CM | POA: Insufficient documentation

## 2015-02-28 DIAGNOSIS — Z79899 Other long term (current) drug therapy: Secondary | ICD-10-CM | POA: Insufficient documentation

## 2015-02-28 DIAGNOSIS — Z87891 Personal history of nicotine dependence: Secondary | ICD-10-CM | POA: Diagnosis not present

## 2015-02-28 LAB — CBC WITH DIFFERENTIAL/PLATELET
Basophils Absolute: 0 10*3/uL (ref 0–0.1)
Basophils Relative: 0 %
Eosinophils Absolute: 0.1 10*3/uL (ref 0–0.7)
Eosinophils Relative: 3 %
HEMATOCRIT: 26.8 % — AB (ref 35.0–47.0)
Hemoglobin: 8.3 g/dL — ABNORMAL LOW (ref 12.0–16.0)
LYMPHS ABS: 1.4 10*3/uL (ref 1.0–3.6)
MCH: 24.2 pg — ABNORMAL LOW (ref 26.0–34.0)
MCHC: 30.9 g/dL — AB (ref 32.0–36.0)
MCV: 78.4 fL — AB (ref 80.0–100.0)
MONO ABS: 0.4 10*3/uL (ref 0.2–0.9)
NEUTROS ABS: 1.9 10*3/uL (ref 1.4–6.5)
Neutrophils Relative %: 50 %
Platelets: 291 10*3/uL (ref 150–440)
RBC: 3.41 MIL/uL — ABNORMAL LOW (ref 3.80–5.20)
RDW: 18.1 % — AB (ref 11.5–14.5)
WBC: 3.8 10*3/uL (ref 3.6–11.0)

## 2015-02-28 LAB — IRON AND TIBC
IRON: 61 ug/dL (ref 28–170)
SATURATION RATIOS: 13 % (ref 10.4–31.8)
TIBC: 473 ug/dL — AB (ref 250–450)
UIBC: 412 ug/dL

## 2015-02-28 LAB — RETICULOCYTES
RBC.: 3.41 MIL/uL — ABNORMAL LOW (ref 3.80–5.20)
Retic Count, Absolute: 61.4 10*3/uL (ref 19.0–183.0)
Retic Ct Pct: 1.8 % (ref 0.4–3.1)

## 2015-02-28 LAB — COMPREHENSIVE METABOLIC PANEL
ALBUMIN: 3.6 g/dL (ref 3.5–5.0)
ALK PHOS: 55 U/L (ref 38–126)
ALT: 30 U/L (ref 14–54)
ANION GAP: 11 (ref 5–15)
AST: 55 U/L — ABNORMAL HIGH (ref 15–41)
BILIRUBIN TOTAL: 0.5 mg/dL (ref 0.3–1.2)
BUN: 20 mg/dL (ref 6–20)
CO2: 24 mmol/L (ref 22–32)
Calcium: 9.1 mg/dL (ref 8.9–10.3)
Chloride: 105 mmol/L (ref 101–111)
Creatinine, Ser: 0.89 mg/dL (ref 0.44–1.00)
GFR calc Af Amer: >60 mL/min (ref >60)
GFR calc non Af Amer: >60 mL/min (ref >60)
GLUCOSE: 105 mg/dL — AB (ref 65–99)
POTASSIUM: 3.8 mmol/L (ref 3.5–5.1)
Sodium: 140 mmol/L (ref 135–145)
Total Protein: 8.2 g/dL — ABNORMAL HIGH (ref 6.5–8.1)

## 2015-02-28 LAB — URINALYSIS COMPLETE WITH MICROSCOPIC (ARMC ONLY)
Bilirubin Urine: NEGATIVE
Glucose, UA: NEGATIVE mg/dL
HGB URINE DIPSTICK: NEGATIVE
Ketones, ur: NEGATIVE mg/dL
LEUKOCYTES UA: NEGATIVE
Nitrite: NEGATIVE
PH: 5.5 (ref 5.0–8.0)
PROTEIN: NEGATIVE mg/dL
Specific Gravity, Urine: 1.025 (ref 1.005–1.030)

## 2015-02-28 LAB — LACTATE DEHYDROGENASE: LDH: 134 U/L (ref 98–192)

## 2015-02-28 LAB — FERRITIN: Ferritin: 14 ng/mL (ref 11–307)

## 2015-02-28 NOTE — Progress Notes (Signed)
[place] NOTE  Patient Care Team: Arlis Porta., MD as PCP - General (Family Medicine)  CHIEF COMPLAINTS/PURPOSE OF CONSULTATION:   # AUG 2016- IRON DEFICIENCY ANEMIA-? etiology  # Rectal well diff carcinoid [1.2CM; s/p polypectomy 2011]; EUS- Neg. 2014 EGD/[facility] [Dr.Elliot]  HISTORY OF PRESENTING ILLNESS:  Diana Harmon 69 y.o.  female chronic arthritis walks with a cane, obesity; noted to have microcytic anemia/iron deficiency on labs PCP approximately 2 months ago. Patient notes to have EGD colonoscopy approximately 2 years ago- no source of any bleeding noted that time. Patient did have a rectal carcinoid/1.2 cm s/p resection in 2011.  Patient denies any blood in stools. Denies any weight loss. She denies any current difficulty swallowing or pain with swallowing. She has intermittent diarrhea once every 3 days up to 3 loose stools a day. She feels this is exacerbated with fatty foods. She also has intermittent nausea without vomiting.  Patient has been on by mouth iron for the last few months without any significant improvement. She has been referred to [place] by GI for further workup/recommendations.  ROS: A complete 10 point review of system is done which is negative except mentioned above in history of present illness  MEDICAL HISTORY:  Past Medical History  Diagnosis Date  . Thyroid disease   . Benign tumor     SURGICAL HISTORY: Past Surgical History  Procedure Laterality Date  . Tumor removed    . Thyroidectomy      SOCIAL HISTORY: Social History   Social History  . Marital Status: Widowed    Spouse Name: N/A  . Number of Children: N/A  . Years of Education: N/A   Occupational History  . Not on file.   Social History Main Topics  . Smoking status: Former Smoker    Quit date: 12/05/1979  . Smokeless tobacco: Never Used  . Alcohol Use: No  . Drug Use: No  . Sexual Activity: Not on file   Other Topics Concern  . Not on  file   Social History Narrative    FAMILY HISTORY: Family History  Problem Relation Age of Onset  . Parkinson's disease Mother   . Heart disease Father     ALLERGIES:  is allergic to penicillins.  MEDICATIONS:  Current Outpatient Prescriptions  Medication Sig Dispense Refill  . ferrous sulfate 325 (65 FE) MG tablet Take 1 tablet (325 mg total) by mouth 2 (two) times daily with a meal. 60 tablet 6  . glimepiride (AMARYL) 2 MG tablet Take 1 tablet (2 mg total) by mouth daily before breakfast. 90 tablet 3  . hydrochlorothiazide (HYDRODIURIL) 12.5 MG tablet Take 1 tablet (12.5 mg total) by mouth daily. 90 tablet 3  . losartan (COZAAR) 50 MG tablet Take 1 tablet (50 mg total) by mouth daily. 90 tablet 3  . metFORMIN (GLUCOPHAGE) 500 MG tablet Take 2 tablets (1,000 mg total) by mouth 2 (two) times daily with a meal. 360 tablet 1  . Omeprazole 20 MG TBEC Take 1 tablet (20 mg total) by mouth every morning. 90 each 3  . simvastatin (ZOCOR) 10 MG tablet Take 1 tablet (10 mg total) by mouth daily at 6 PM. 90 tablet 3   No current facility-administered medications for this visit.      Marland Kitchen  PHYSICAL EXAMINATION: ECOG PERFORMANCE STATUS: 1 - Symptomatic but completely ambulatory  Filed Vitals:   02/28/15 1110  BP: 157/70  Pulse: 85  Temp: 98 F (36.7 C)  Resp: 18   Filed Weights   02/28/15 1110  Weight: 229 lb 8 oz (104.1 kg)    GENERAL: Well-nourished well-developed; Alert, no distress and comfortable.   Walks with a cane. Obese. EYES: no pallor or icterus OROPHARYNX: no thrush or ulceration; good dentition  NECK: supple, no masses felt LYMPH:  no palpable lymphadenopathy in the cervical, axillary or inguinal regions LUNGS: clear to auscultation and  No wheeze or crackles HEART/CVS: regular rate & rhythm and no murmurs; No lower extremity edema ABDOMEN: abdomen soft, non-tender and normal bowel sounds Musculoskeletal:no cyanosis of digits and no clubbing  PSYCH: alert &  oriented x 3 with fluent speech NEURO: no focal motor/sensory deficits SKIN:  no rashes or significant lesions  LABORATORY DATA:  I have reviewed the data as listed Lab Results  Component Value Date   WBC 4.3 12/06/2014   HGB 9.0* 09/19/2014   HCT 28.9* 12/06/2014   PLT 335 09/19/2014    Recent Labs  09/19/14 12/06/14 0846  K 4.5  --   BUN 19  --   CREATININE 0.9  --   PROT  --  8.2  ALBUMIN  --  3.9  AST 73* 64*  ALT 44* 43*  ALKPHOS 69 60  BILITOT  --  0.3  BILIDIR  --  0.15    RADIOGRAPHIC STUDIES: I have personally reviewed the radiological images as listed and agreed with the findings in the report. No results found.  ASSESSMENT & PLAN:   # Iron deficiency anemia- unclear etiology. Question GI blood loss versus malabsorption. Patient is awaiting upper and lower endoscopies in the next few weeks. I suspect malabsorption/especially with intermittent diarrhea.   I would recommend checking CBC CMP and LDH and reticulocyte count and iron studies soluble transferrin receptor. I also recommend checking celiac panel.  # Patient is symptomatic from anemia- most recent hemoglobin was 8-9 recently. I recommend giving IV iron to weekly 2. Follow-up in approximately 6 weeks to review the results of the upper lab work.  All questions were answered. The patient knows to call the clinic with any problems, questions or concerns.  Thank you Ms.Mills/Dr.Hawkins for allowing me to participate in the care of your pleasant patient. Please do not hesitate to contact me with questions or concerns in the interim.   I spent 25 minutes counseling the patient face to face. The total time spent in the appointment was 30 minutes and more than 50% was on counseling.     Cammie Sickle, MD 02/28/2015 11:25 AM

## 2015-03-03 LAB — CELIAC DISEASE PANEL
Endomysial Ab, IgA: NEGATIVE
IGA: 623 mg/dL — AB (ref 87–352)

## 2015-03-03 LAB — HAPTOGLOBIN: HAPTOGLOBIN: 226 mg/dL — AB (ref 34–200)

## 2015-03-07 ENCOUNTER — Inpatient Hospital Stay: Payer: Medicare Other | Attending: Internal Medicine

## 2015-03-07 VITALS — BP 141/70 | HR 73 | Temp 96.8°F | Resp 18

## 2015-03-07 DIAGNOSIS — Z79899 Other long term (current) drug therapy: Secondary | ICD-10-CM | POA: Diagnosis not present

## 2015-03-07 DIAGNOSIS — D509 Iron deficiency anemia, unspecified: Secondary | ICD-10-CM | POA: Insufficient documentation

## 2015-03-07 DIAGNOSIS — D508 Other iron deficiency anemias: Secondary | ICD-10-CM

## 2015-03-07 LAB — SOLUBLE TRANSFERRIN RECEPTOR: TRANSFERRIN RECEPTOR: 37.5 nmol/L — AB (ref 12.2–27.3)

## 2015-03-07 MED ORDER — HEPARIN SOD (PORK) LOCK FLUSH 100 UNIT/ML IV SOLN: 500.0000 [IU] | Freq: Once | INTRAVENOUS | Status: DC

## 2015-03-07 MED ORDER — SODIUM CHLORIDE 0.9 % IV SOLN
510.0000 mg | Freq: Once | INTRAVENOUS | Status: AC
  Administered 2015-03-07: 510 mg via INTRAVENOUS
  Filled 2015-03-07: qty 17

## 2015-03-07 MED ORDER — SODIUM CHLORIDE 0.9 % IJ SOLN
10.0000 mL | INTRAMUSCULAR | Status: DC | PRN
  Filled 2015-03-07: qty 10

## 2015-03-07 MED ORDER — SODIUM CHLORIDE 0.9 % IV SOLN
Freq: Once | INTRAVENOUS | Status: AC
  Administered 2015-03-07: 10:00:00 via INTRAVENOUS
  Filled 2015-03-07: qty 1000

## 2015-03-10 ENCOUNTER — Encounter: Payer: Self-pay | Admitting: *Deleted

## 2015-03-13 ENCOUNTER — Ambulatory Visit: Payer: Medicare Other | Admitting: Anesthesiology

## 2015-03-13 ENCOUNTER — Ambulatory Visit
Admission: RE | Admit: 2015-03-13 | Discharge: 2015-03-13 | Disposition: A | Discharge status: Home / Self Care | Payer: Medicare Other | Source: Ambulatory Visit | Attending: Unknown Physician Specialty | Admitting: Unknown Physician Specialty

## 2015-03-13 ENCOUNTER — Encounter
Admission: RE | Disposition: A | Discharge status: Home / Self Care | Payer: Self-pay | Source: Ambulatory Visit | Attending: Unknown Physician Specialty

## 2015-03-13 ENCOUNTER — Encounter: Payer: Self-pay | Admitting: *Deleted

## 2015-03-13 DIAGNOSIS — E079 Disorder of thyroid, unspecified: Secondary | ICD-10-CM | POA: Diagnosis not present

## 2015-03-13 DIAGNOSIS — Z880 Allergy status to penicillin: Secondary | ICD-10-CM | POA: Insufficient documentation

## 2015-03-13 DIAGNOSIS — Z7984 Long term (current) use of oral hypoglycemic drugs: Secondary | ICD-10-CM | POA: Diagnosis not present

## 2015-03-13 DIAGNOSIS — K449 Diaphragmatic hernia without obstruction or gangrene: Secondary | ICD-10-CM | POA: Diagnosis not present

## 2015-03-13 DIAGNOSIS — K640 First degree hemorrhoids: Secondary | ICD-10-CM | POA: Insufficient documentation

## 2015-03-13 DIAGNOSIS — K2980 Duodenitis without bleeding: Secondary | ICD-10-CM | POA: Insufficient documentation

## 2015-03-13 DIAGNOSIS — I10 Essential (primary) hypertension: Secondary | ICD-10-CM | POA: Insufficient documentation

## 2015-03-13 DIAGNOSIS — E119 Type 2 diabetes mellitus without complications: Secondary | ICD-10-CM | POA: Insufficient documentation

## 2015-03-13 DIAGNOSIS — E785 Hyperlipidemia, unspecified: Secondary | ICD-10-CM | POA: Insufficient documentation

## 2015-03-13 DIAGNOSIS — Q2733 Arteriovenous malformation of digestive system vessel: Secondary | ICD-10-CM | POA: Diagnosis not present

## 2015-03-13 DIAGNOSIS — Z79899 Other long term (current) drug therapy: Secondary | ICD-10-CM | POA: Insufficient documentation

## 2015-03-13 DIAGNOSIS — K5520 Angiodysplasia of colon without hemorrhage: Secondary | ICD-10-CM | POA: Insufficient documentation

## 2015-03-13 DIAGNOSIS — K2950 Unspecified chronic gastritis without bleeding: Secondary | ICD-10-CM | POA: Insufficient documentation

## 2015-03-13 DIAGNOSIS — D509 Iron deficiency anemia, unspecified: Secondary | ICD-10-CM | POA: Insufficient documentation

## 2015-03-13 HISTORY — PX: COLONOSCOPY WITH PROPOFOL: SHX5780

## 2015-03-13 HISTORY — DX: Essential (primary) hypertension: I10

## 2015-03-13 HISTORY — PX: ESOPHAGOGASTRODUODENOSCOPY: SHX5428

## 2015-03-13 LAB — GLUCOSE, CAPILLARY: GLUCOSE-CAPILLARY: 135 mg/dL — AB (ref 65–99)

## 2015-03-13 SURGERY — COLONOSCOPY WITH PROPOFOL
Anesthesia: General

## 2015-03-13 MED ORDER — PROPOFOL 10 MG/ML IV BOLUS
INTRAVENOUS | Status: DC | PRN
  Administered 2015-03-13: 50 mg via INTRAVENOUS

## 2015-03-13 MED ORDER — SODIUM CHLORIDE 0.9 % IV SOLN
INTRAVENOUS | Status: DC
  Administered 2015-03-13 (×2): via INTRAVENOUS
  Administered 2015-03-13: 1000 mL via INTRAVENOUS

## 2015-03-13 MED ORDER — SODIUM CHLORIDE 0.9 % IV SOLN: INTRAVENOUS | Status: DC

## 2015-03-13 MED ORDER — LIDOCAINE HCL (CARDIAC) 20 MG/ML IV SOLN
INTRAVENOUS | Status: DC | PRN
  Administered 2015-03-13: 30 mg via INTRAVENOUS

## 2015-03-13 MED ORDER — FENTANYL CITRATE (PF) 100 MCG/2ML IJ SOLN
INTRAMUSCULAR | Status: DC | PRN
  Administered 2015-03-13: 50 ug via INTRAVENOUS

## 2015-03-13 MED ORDER — MIDAZOLAM HCL 5 MG/5ML IJ SOLN
INTRAMUSCULAR | Status: DC | PRN
  Administered 2015-03-13: 1 mg via INTRAVENOUS

## 2015-03-13 MED ORDER — PROPOFOL 10 MG/ML IV BOLUS: INTRAVENOUS | Status: DC | PRN

## 2015-03-13 MED ORDER — PROPOFOL 500 MG/50ML IV EMUL
INTRAVENOUS | Status: DC | PRN
  Administered 2015-03-13: 140 ug/kg/min via INTRAVENOUS

## 2015-03-13 NOTE — Anesthesia Postprocedure Evaluation (Signed)
  Anesthesia Post-op Note  Patient: Diana Harmon  Procedure(s) Performed: Procedure(s): COLONOSCOPY WITH PROPOFOL (N/A) ESOPHAGOGASTRODUODENOSCOPY (EGD) (N/A)  Anesthesia type:General  Patient location: PACU  Post pain: Pain level controlled  Post assessment: Post-op Vital signs reviewed, Patient's Cardiovascular Status Stable, Respiratory Function Stable, Patent Airway and No signs of Nausea or vomiting  Post vital signs: Reviewed and stable  Last Vitals:  Filed Vitals:   03/13/15 1112  BP: 148/79  Pulse: 74  Temp:   Resp: 18    Level of consciousness: awake, alert  and patient cooperative  Complications: No apparent anesthesia complications

## 2015-03-13 NOTE — Transfer of Care (Signed)
Immediate Anesthesia Transfer of Care Note  Patient: Diana Harmon  Procedure(s) Performed: Procedure(s): COLONOSCOPY WITH PROPOFOL (N/A) ESOPHAGOGASTRODUODENOSCOPY (EGD) (N/A)  Patient Location: PACU and Short Stay  Anesthesia Type:General  Level of Consciousness: awake, oriented and patient cooperative  Airway & Oxygen Therapy: Patient Spontanous Breathing and Patient connected to nasal cannula oxygen  Post-op Assessment: Report given to RN and Post -op Vital signs reviewed and stable  Post vital signs: Reviewed and stable  Last Vitals:  Filed Vitals:   03/13/15 0844  BP: 151/64  Pulse: 80  Temp: 36.8 C  Resp: 18    Complications: No apparent anesthesia complications

## 2015-03-13 NOTE — Op Note (Signed)
[facility] Gastroenterology Patient Name: Diana Harmon Procedure Date: 03/13/2015 9:36 AM MRN: [id number] Account #: [id number] Date of Birth: Oct 14, 1945 Admit Type: Outpatient Age: 69 Room: [facility] ENDO ROOM 1 Gender: Female Note Status: Finalized Procedure:         Upper GI endoscopy Indications:       Unexplained iron deficiency anemia Providers:         Manya Silvas, MD Referring MD:      Arlis Porta, MD (Referring MD) Medicines:         Propofol per Anesthesia Complications:     No immediate complications. Procedure:         Pre-Anesthesia Assessment:                    - After reviewing the risks and benefits, the patient was                     deemed in satisfactory condition to undergo the procedure.                    After obtaining informed consent, the endoscope was passed                     under direct vision. Throughout the procedure, the                     patient's blood pressure, pulse, and oxygen saturations                     were monitored continuously. The Olympus GIF-160 endoscope                     (S#. [id number]) was introduced through the mouth, and                     advanced to the second part of duodenum. The upper GI                     endoscopy was accomplished without difficulty. The patient                     tolerated the procedure well. Findings:      The esophagus was normal. GEJ 38cm.      A small hiatus hernia was present. 2cm in length      Patchy mildly erythematous mucosa without active bleeding and with no       stigmata of bleeding was found in the duodenal bulb. Biopsies were taken       with a cold forceps for histology.      The 2nd part of the duodenum was normal. Impression:        - Normal esophagus.                    - Small hiatus hernia.                    - Erythematous duodenopathy. Biopsied.                    - Normal 2nd part of the duodenum. Recommendation:    - Await pathology  results.                    - Perform a colonoscopy as previously scheduled. Manya Silvas, MD 03/13/2015  10:05:13 AM This report has been signed electronically. Number of Addenda: 0 Note Initiated On: 03/13/2015 9:36 AM      [facility]

## 2015-03-13 NOTE — H&P (Signed)
Primary Care Physician:  Dicky Doe, MD Primary Gastroenterologist:  Dr. Vira Agar  Pre-Procedure History & Physical: HPI:  Diana Harmon is a 69 y.o. female is here for an endoscopy and colonoscopy.   Past Medical History  Diagnosis Date  . Thyroid disease   . Benign tumor   . Diabetes mellitus without complication ([place])   . Nausea   . Anemia   . Patient is [name]   . Hypertension   . Hyperlipidemia     Past Surgical History  Procedure Laterality Date  . Tumor removed    . Thyroidectomy    . Colonoscopy      Prior to Admission medications   Medication Sig Start Date End Date Taking? Authorizing Provider  hydrochlorothiazide (HYDRODIURIL) 12.5 MG tablet Take 1 tablet (12.5 mg total) by mouth daily. 12/05/14  Yes Arlis Porta., MD  losartan (COZAAR) 50 MG tablet Take 1 tablet (50 mg total) by mouth daily. 12/05/14  Yes Arlis Porta., MD  amLODipine (NORVASC) 10 MG tablet Take 10 mg by mouth 2 (two) times daily.    Historical Provider, MD  ferrous sulfate 325 (65 FE) MG tablet Take 1 tablet (325 mg total) by mouth 2 (two) times daily with a meal. 01/05/15   Arlis Porta., MD  glimepiride (AMARYL) 2 MG tablet Take 1 tablet (2 mg total) by mouth daily before breakfast. 12/05/14   Arlis Porta., MD  metFORMIN (GLUCOPHAGE) 500 MG tablet Take 2 tablets (1,000 mg total) by mouth 2 (two) times daily with a meal. 02/13/15   Arlis Porta., MD  Omeprazole 20 MG TBEC Take 1 tablet (20 mg total) by mouth every morning. 12/05/14   Arlis Porta., MD  simvastatin (ZOCOR) 10 MG tablet Take 1 tablet (10 mg total) by mouth daily at 6 PM. 12/05/14   Arlis Porta., MD    Allergies as of 02/17/2015 - Review Complete 01/05/2015  Allergen Reaction Noted  . Penicillins Rash, Swelling, and Hives 12/05/2014    Family History  Problem Relation Age of Onset  . Parkinson's disease Mother   . Heart disease Father   . Lupus Sister   . Aneurysm  Son   . Lupus Son     Social History   Social History  . Marital Status: Widowed    Spouse Name: N/A  . Number of Children: N/A  . Years of Education: N/A   Occupational History  . Not on file.   Social History Main Topics  . Smoking status: Never Smoker   . Smokeless tobacco: Never Used  . Alcohol Use: 0.6 oz/week    1 Glasses of wine per week     Comment: occ. wine  . Drug Use: No  . Sexual Activity: Not on file   Other Topics Concern  . Not on file   Social History Narrative    Review of Systems: See HPI, otherwise negative ROS  Physical Exam: BP 151/64 mmHg  Pulse 80  Temp(Src) 98.2 F (36.8 C) (Oral)  Resp 18  Ht 5\' 1"  (1.549 m)  SpO2 99% General:   Alert,  pleasant and cooperative in NAD Head:  Normocephalic and atraumatic. Neck:  Supple; no masses or thyromegaly. Lungs:  Clear throughout to auscultation.    Heart:  Regular rate and rhythm. Abdomen:  Soft, nontender and nondistended. Normal bowel sounds, without guarding, and without rebound.   Neurologic:  Alert and  oriented x4;  grossly normal neurologically.  Impression/Plan: [name] is here for an endoscopy and colonoscopy to be performed for iron def anemia  Risks, benefits, limitations, and alternatives regarding  endoscopy and colonoscopy have been reviewed with the patient.  Questions have been answered.  All parties agreeable.   Gaylyn Cheers, MD  03/13/2015, 9:41 AM

## 2015-03-13 NOTE — Op Note (Signed)
[facility] Patient Name: Diana Harmon Procedure Date: 03/13/2015 9:35 AM MRN: [id number] Account #: [id number] Date of Birth: [birth year] Admit Type: Outpatient Age: 69 Room: [facility] ENDO ROOM 1 Gender: Female Note Status: Finalized Procedure:         Colonoscopy Indications:       Unexplained iron deficiency anemia Providers:         Manya Silvas, MD Referring MD:      Arlis Porta, MD (Referring MD) Medicines:         Propofol per Anesthesia Complications:     No immediate complications. Procedure:         Pre-Anesthesia Assessment:                    - After reviewing the risks and benefits, the patient was                     deemed in satisfactory condition to undergo the procedure.                    After obtaining informed consent, the colonoscope was                     passed under direct vision. Throughout the procedure, the                     patient's blood pressure, pulse, and oxygen saturations                     were monitored continuously. The Colonoscope was                     introduced through the anus and advanced to the the cecum,                     identified by appendiceal orifice and ileocecal valve. The                     colonoscopy was performed without difficulty. The patient                     tolerated the procedure well. The quality of the bowel                     preparation was excellent. Findings:      A single medium-sized localized angioectasia without bleeding was found       in the cecum. Coagulation for tissue destruction using argon plasma at       0.5 liters/minute and 20 watts was successful. 10cc of saline was       injected beneath the AVM before cautery done.      A single small angioectasia without bleeding was found at the hepatic       flexure. Coagulation for tissue destruction using argon plasma at 0.5       liters/minute and 20 watts was successful. One hemostatic clip was     successfully placed. There was no bleeding during, and at the end, of       the procedure.      Internal hemorrhoids were found during endoscopy. The hemorrhoids were       small and Grade I (internal hemorrhoids that do not prolapse). The       rectal area had the previous tattoo where the previous growth was  removed a few years ago. Impression:        - A single non-bleeding colonic angioectasia. Treated with                     argon plasma coagulation (APC).                    - A single non-bleeding colonic angioectasia. Treated with                     argon plasma coagulation (APC). Clip was placed.                    - Internal hemorrhoids.                    - No specimens collected. Recommendation:    - The findings and recommendations were discussed with the                     patient's family. Manya Silvas, MD 03/13/2015 10:38:40 AM This report has been signed electronically. Number of Addenda: 0 Note Initiated On: 03/13/2015 9:35 AM Total Procedure Duration: 0 hours 24 minutes 19 seconds       [facility]

## 2015-03-13 NOTE — Anesthesia Preprocedure Evaluation (Signed)
Anesthesia Evaluation  Patient identified by MRN, date of birth, ID band Patient awake    Reviewed: Allergy & Precautions, H&P , NPO status , Patient's Chart, lab work & pertinent test results, reviewed documented beta blocker date and time   History of Anesthesia Complications Negative for: history of anesthetic complications  Airway Mallampati: III  TM Distance: >3 FB Neck ROM: full    Dental no notable dental hx.    Pulmonary neg pulmonary ROS,    Pulmonary exam normal breath sounds clear to auscultation       Cardiovascular Exercise Tolerance: Good hypertension, On Medications (-) angina(-) CAD, (-) Past MI, (-) Cardiac Stents and (-) CABG Normal cardiovascular exam(-) dysrhythmias + Valvular Problems/Murmurs  Rhythm:regular Rate:Normal     Neuro/Psych negative neurological ROS  negative psych ROS   GI/Hepatic Neg liver ROS, GERD  Medicated and Controlled,  Endo/Other  diabetesMorbid obesity  Renal/GU negative Renal ROS  negative genitourinary   Musculoskeletal   Abdominal   Peds  Hematology  (+) Blood dyscrasia, anemia ,   Anesthesia Other Findings Past Medical History:   Thyroid disease                                              Benign tumor                                                 Diabetes mellitus without complication (HCC)                 Nausea                                                       Anemia                                                       Patient is [name]                                 Hypertension                                                 Hyperlipidemia                                               Reproductive/Obstetrics negative OB ROS                             Anesthesia Physical Anesthesia Plan  ASA: III  Anesthesia Plan: General   Post-op Pain Management:  Induction:   Airway Management Planned:   Additional  Equipment:   Intra-op Plan:   Post-operative Plan:   Informed Consent: I have reviewed the patients History and Physical, chart, labs and discussed the procedure including the risks, benefits and alternatives for the proposed anesthesia with the patient or authorized representative who has indicated his/her understanding and acceptance.   Dental Advisory Given  Plan Discussed with: Anesthesiologist, CRNA and Surgeon  Anesthesia Plan Comments:         Anesthesia Quick Evaluation

## 2015-03-14 ENCOUNTER — Inpatient Hospital Stay: Payer: Medicare Other

## 2015-03-14 ENCOUNTER — Encounter: Payer: Self-pay | Admitting: Unknown Physician Specialty

## 2015-03-14 VITALS — BP 157/68 | HR 79 | Temp 96.6°F | Resp 20

## 2015-03-14 DIAGNOSIS — D508 Other iron deficiency anemias: Secondary | ICD-10-CM

## 2015-03-14 DIAGNOSIS — Z79899 Other long term (current) drug therapy: Secondary | ICD-10-CM | POA: Diagnosis not present

## 2015-03-14 DIAGNOSIS — D509 Iron deficiency anemia, unspecified: Secondary | ICD-10-CM | POA: Diagnosis not present

## 2015-03-14 LAB — SURGICAL PATHOLOGY

## 2015-03-14 MED ORDER — SODIUM CHLORIDE 0.9 % IV SOLN
Freq: Once | INTRAVENOUS | Status: AC
  Administered 2015-03-14: 10:00:00 via INTRAVENOUS
  Filled 2015-03-14: qty 1000

## 2015-03-14 MED ORDER — FERUMOXYTOL INJECTION 510 MG/17 ML
510.0000 mg | Freq: Once | INTRAVENOUS | Status: AC
  Administered 2015-03-14: 510 mg via INTRAVENOUS
  Filled 2015-03-14: qty 17

## 2015-03-17 DIAGNOSIS — K625 Hemorrhage of anus and rectum: Secondary | ICD-10-CM | POA: Diagnosis not present

## 2015-04-05 ENCOUNTER — Other Ambulatory Visit: Payer: Self-pay | Admitting: Family Medicine

## 2015-04-05 DIAGNOSIS — Z1211 Encounter for screening for malignant neoplasm of colon: Secondary | ICD-10-CM

## 2015-04-11 ENCOUNTER — Inpatient Hospital Stay: Payer: Medicare Other | Attending: Internal Medicine

## 2015-04-11 ENCOUNTER — Inpatient Hospital Stay (HOSPITAL_BASED_OUTPATIENT_CLINIC_OR_DEPARTMENT_OTHER): Payer: Medicare Other | Admitting: Internal Medicine

## 2015-04-11 VITALS — BP 178/83 | HR 84 | Temp 97.8°F | Resp 18 | Ht 61.0 in | Wt 230.4 lb

## 2015-04-11 DIAGNOSIS — E119 Type 2 diabetes mellitus without complications: Secondary | ICD-10-CM | POA: Diagnosis not present

## 2015-04-11 DIAGNOSIS — I10 Essential (primary) hypertension: Secondary | ICD-10-CM

## 2015-04-11 DIAGNOSIS — E079 Disorder of thyroid, unspecified: Secondary | ICD-10-CM

## 2015-04-11 DIAGNOSIS — Z79899 Other long term (current) drug therapy: Secondary | ICD-10-CM

## 2015-04-11 DIAGNOSIS — Z7984 Long term (current) use of oral hypoglycemic drugs: Secondary | ICD-10-CM

## 2015-04-11 DIAGNOSIS — D509 Iron deficiency anemia, unspecified: Secondary | ICD-10-CM | POA: Diagnosis not present

## 2015-04-11 DIAGNOSIS — R110 Nausea: Secondary | ICD-10-CM | POA: Insufficient documentation

## 2015-04-11 DIAGNOSIS — E785 Hyperlipidemia, unspecified: Secondary | ICD-10-CM | POA: Diagnosis not present

## 2015-04-11 DIAGNOSIS — D508 Other iron deficiency anemias: Secondary | ICD-10-CM

## 2015-04-11 LAB — CBC WITH DIFFERENTIAL/PLATELET
Basophils Absolute: 0.1 10*3/uL (ref 0–0.1)
Basophils Relative: 2 %
Eosinophils Absolute: 0.1 10*3/uL (ref 0–0.7)
Eosinophils Relative: 3 %
HCT: 31.3 % — ABNORMAL LOW (ref 35.0–47.0)
Hemoglobin: 10 g/dL — ABNORMAL LOW (ref 12.0–16.0)
Lymphocytes Relative: 38 %
Lymphs Abs: 1.3 10*3/uL (ref 1.0–3.6)
MCH: 27.1 pg (ref 26.0–34.0)
MCHC: 31.8 g/dL — ABNORMAL LOW (ref 32.0–36.0)
MCV: 85.1 fL (ref 80.0–100.0)
Monocytes Absolute: 0.5 10*3/uL (ref 0.2–0.9)
Monocytes Relative: 15 %
Neutro Abs: 1.4 10*3/uL (ref 1.4–6.5)
Neutrophils Relative %: 42 %
Platelets: 193 10*3/uL (ref 150–440)
RBC: 3.68 MIL/uL — ABNORMAL LOW (ref 3.80–5.20)
RDW: 24.5 % — ABNORMAL HIGH (ref 11.5–14.5)
WBC: 3.4 10*3/uL — ABNORMAL LOW (ref 3.6–11.0)

## 2015-04-11 NOTE — Progress Notes (Signed)
The patient reports a fereheme allergy. She states that her neck swelled up after last IV fereheme tx. She was told to take benadryl otc. However, she states that her "swelling in the left side of my neck is still present."  Patient states that she is [name] and is not able to take blood products.  She brought a copy of her notarized healthcare power of attorney documents, which will be filed on her chart today.

## 2015-04-11 NOTE — Progress Notes (Signed)
[place] NOTE  Patient Care Team: Arlis Porta., MD as PCP - General (Family Medicine)  CHIEF COMPLAINTS/PURPOSE OF CONSULTATION:   # AUG 2016- IRON DEFICIENCY ANEMIA- Possible AVMs of colon [s/p Argon laser; Dr.Elliot;EGD-neg Nov 2016] s/p IV Ferriheme x2 [Nov2016]  # Rectal well diff carcinoid [1.2CM; s/p polypectomy 2011]; Allergy- [facility]a Course 2016]- neck/face swelling [improved with benadryl]  HISTORY OF PRESENTING ILLNESS:  Diana Harmon 69 y.o.  female is here for follow-up for her microcytic iron deficiency anemia.  In the interim patient had EGD colonoscopy- colonoscopy showed AVMs in the colon which were non-bleeding; and had lasered. I reviewed the pathology; no concerns for celiac.  Patient tolerated IV Feraheme fairly well except she noted to have the day after treatment has swelling of the left side of the face/neck. She denied any tongue swelling or difficulty swallowing or difficulty breathing. This subsided over the 1-2 days after she started taking Benadryl.  She feels more energetic after the IV iron infusion.   ROS: A complete 10 point review of system is done which is negative except mentioned above in history of present illness  MEDICAL HISTORY:  Past Medical History  Diagnosis Date  . Thyroid disease   . Benign tumor   . Diabetes mellitus without complication ([place])   . Nausea   . Anemia   . Patient is [name]   . Hypertension   . Hyperlipidemia     SURGICAL HISTORY: Past Surgical History  Procedure Laterality Date  . Tumor removed    . Thyroidectomy    . Colonoscopy    . Colonoscopy with propofol N/A 03/13/2015    Procedure: COLONOSCOPY WITH PROPOFOL;  Surgeon: Manya Silvas, MD;  Location: [facility] ENDOSCOPY;  Service: Endoscopy;  Laterality: N/A;  . Esophagogastroduodenoscopy N/A 03/13/2015    Procedure: ESOPHAGOGASTRODUODENOSCOPY (EGD);  Surgeon: Manya Silvas, MD;  Location: [facility] ENDOSCOPY;   Service: Endoscopy;  Laterality: N/A;    SOCIAL HISTORY: Social History   Social History  . Marital Status: Widowed    Spouse Name: N/A  . Number of Children: N/A  . Years of Education: N/A   Occupational History  . Not on file.   Social History Main Topics  . Smoking status: Never Smoker   . Smokeless tobacco: Never Used  . Alcohol Use: 0.6 oz/week    1 Glasses of wine per week     Comment: occ. wine  . Drug Use: No  . Sexual Activity: Not on file   Other Topics Concern  . Not on file   Social History Narrative    FAMILY HISTORY: Family History  Problem Relation Age of Onset  . Parkinson's disease Mother   . Heart disease Father   . Lupus Sister   . Aneurysm Son   . Lupus Son     ALLERGIES:  is allergic to ferumoxytol; other; and penicillins.  MEDICATIONS:  Current Outpatient Prescriptions  Medication Sig Dispense Refill  . ferrous sulfate 325 (65 FE) MG tablet Take 1 tablet (325 mg total) by mouth 2 (two) times daily with a meal. 60 tablet 6  . glimepiride (AMARYL) 2 MG tablet Take 1 tablet (2 mg total) by mouth daily before breakfast. 90 tablet 3  . hydrochlorothiazide (HYDRODIURIL) 12.5 MG tablet Take 1 tablet (12.5 mg total) by mouth daily. 90 tablet 3  . losartan (COZAAR) 50 MG tablet Take 1 tablet (50 mg total) by mouth daily. 90 tablet 3  . metFORMIN (GLUCOPHAGE)  500 MG tablet Take 2 tablets (1,000 mg total) by mouth 2 (two) times daily with a meal. 360 tablet 1  . Omeprazole 20 MG TBEC Take 1 tablet (20 mg total) by mouth every morning. 90 each 3  . simvastatin (ZOCOR) 10 MG tablet Take 1 tablet (10 mg total) by mouth daily at 6 PM. 90 tablet 3   No current facility-administered medications for this visit.      Marland Kitchen  PHYSICAL EXAMINATION: ECOG PERFORMANCE STATUS: 1 - Symptomatic but completely ambulatory  Filed Vitals:   04/11/15 1011  BP: 178/83  Pulse: 84  Temp: 97.8 F (36.6 C)  Resp: 18   Filed Weights   04/11/15 1011  Weight: 230 lb  6.1 oz (104.5 kg)    GENERAL: Well-nourished well-developed; Alert, no distress and comfortable.   In wheel chair; accompanied by her daughter.  Obese.  LABORATORY DATA:  I have reviewed the data as listed Lab Results  Component Value Date   WBC 3.4* 04/11/2015   HGB 10.0* 04/11/2015   HCT 31.3* 04/11/2015   MCV 85.1 04/11/2015   PLT 193 04/11/2015    Recent Labs  09/19/14 12/06/14 0846 02/28/15 1153  NA  --   --  140  K 4.5  --  3.8  CL  --   --  105  CO2  --   --  24  GLUCOSE  --   --  105*  BUN 19  --  20  CREATININE 0.9  --  0.89  CALCIUM  --   --  9.1  GFRNONAA  --   --  >60  GFRAA  --   --  >60  PROT  --  8.2 8.2*  ALBUMIN  --  3.9 3.6  AST 73* 64* 55*  ALT 44* 43* 30  ALKPHOS 69 64 55  BILITOT  --  0.3 0.5  BILIDIR  --  0.15  --     RADIOGRAPHIC STUDIES: I have personally reviewed the radiological images as listed and agreed with the findings in the report. No results found.  ASSESSMENT & PLAN:   # Iron deficiency anemia-most likely secondary to AVMs of the colon [colonoscopy November 2016]; patient received IV iron/Feraheme in early November/approximately 4 weeks ago. Today hemoglobin is 10.0; up from approximately a month ago. I would recommend IV iron infusion. Since patient had allergy to Feraheme/I would recommend IV Venofer in approximately 1 week.  # I would see her back in about 3 months/CBC/ferritin/possible IV iron at that time. Discussed that she might need ongoing/maintenance IV iron in the future. However, the frequency of the IV iron infusion is currently unknown.  Patient and her daughter were very pleased with the hemoglobin going up.   # 15 minutes face-to-face with the patient discussing the above plan of care; more than 50% of time spent on prognosis/ natural history; counseling and coordination.      Cammie Sickle, MD 04/11/2015 10:34 AM

## 2015-04-12 ENCOUNTER — Encounter: Payer: Self-pay | Admitting: Family Medicine

## 2015-04-12 ENCOUNTER — Ambulatory Visit (INDEPENDENT_AMBULATORY_CARE_PROVIDER_SITE_OTHER): Payer: Medicare Other | Admitting: Family Medicine

## 2015-04-12 VITALS — BP 139/79 | HR 91 | Temp 98.7°F | Resp 16 | Ht 61.0 in | Wt 226.8 lb

## 2015-04-12 DIAGNOSIS — D508 Other iron deficiency anemias: Secondary | ICD-10-CM | POA: Diagnosis not present

## 2015-04-12 DIAGNOSIS — I10 Essential (primary) hypertension: Secondary | ICD-10-CM | POA: Diagnosis not present

## 2015-04-12 DIAGNOSIS — E119 Type 2 diabetes mellitus without complications: Secondary | ICD-10-CM | POA: Diagnosis not present

## 2015-04-12 DIAGNOSIS — E7800 Pure hypercholesterolemia, unspecified: Secondary | ICD-10-CM | POA: Diagnosis not present

## 2015-04-12 DIAGNOSIS — M150 Primary generalized (osteo)arthritis: Secondary | ICD-10-CM

## 2015-04-12 DIAGNOSIS — M159 Polyosteoarthritis, unspecified: Secondary | ICD-10-CM

## 2015-04-12 LAB — POCT GLYCOSYLATED HEMOGLOBIN (HGB A1C): HEMOGLOBIN A1C: 6.1

## 2015-04-12 NOTE — Progress Notes (Signed)
Name: Diana Harmon   MRN: [id number]    DOB: 07-06-1945   Date:04/12/2015       Progress Note  Subjective  Chief Complaint  Chief Complaint  Patient presents with  . Diabetes    highest BS 141 and lowest 98 and avarage 120    HPI  Here for f/u of DM and HBP.  Sees Hematology for iron infusions for iron deficiency anemia.  BSs avg 120.  Hb up to 10.0.  Feeling stronger.   No problem-specific assessment & plan notes found for this encounter.   Past Medical History  Diagnosis Date  . Thyroid disease   . Benign tumor   . Diabetes mellitus without complication ([place])   . Nausea   . Anemia   . Patient is [name]   . Hypertension   . Hyperlipidemia     Past Surgical History  Procedure Laterality Date  . Tumor removed    . Thyroidectomy    . Colonoscopy    . Colonoscopy with propofol N/A 03/13/2015    Procedure: COLONOSCOPY WITH PROPOFOL;  Surgeon: Manya Silvas, MD;  Location: [facility] ENDOSCOPY;  Service: Endoscopy;  Laterality: N/A;  . Esophagogastroduodenoscopy N/A 03/13/2015    Procedure: ESOPHAGOGASTRODUODENOSCOPY (EGD);  Surgeon: Manya Silvas, MD;  Location: [facility] ENDOSCOPY;  Service: Endoscopy;  Laterality: N/A;    Family History  Problem Relation Age of Onset  . Parkinson's disease Mother   . Heart disease Father   . Lupus Sister   . Aneurysm Son   . Lupus Son     Social History   Social History  . Marital Status: Widowed    Spouse Name: N/A  . Number of Children: N/A  . Years of Education: N/A   Occupational History  . Not on file.   Social History Main Topics  . Smoking status: Never Smoker   . Smokeless tobacco: Never Used  . Alcohol Use: 0.6 oz/week    1 Glasses of wine per week     Comment: occ. wine  . Drug Use: No  . Sexual Activity: Not on file   Other Topics Concern  . Not on file   Social History Narrative     Current outpatient prescriptions:  .  ferrous sulfate 325 (65 FE) MG tablet, Take 1 tablet (325 mg  total) by mouth 2 (two) times daily with a meal., Disp: 60 tablet, Rfl: 6 .  glimepiride (AMARYL) 2 MG tablet, Take 1 tablet (2 mg total) by mouth daily before breakfast., Disp: 90 tablet, Rfl: 3 .  hydrochlorothiazide (HYDRODIURIL) 12.5 MG tablet, Take 1 tablet (12.5 mg total) by mouth daily., Disp: 90 tablet, Rfl: 3 .  losartan (COZAAR) 50 MG tablet, Take 1 tablet (50 mg total) by mouth daily., Disp: 90 tablet, Rfl: 3 .  metFORMIN (GLUCOPHAGE) 500 MG tablet, Take 2 tablets (1,000 mg total) by mouth 2 (two) times daily with a meal., Disp: 360 tablet, Rfl: 1 .  Omeprazole 20 MG TBEC, Take 1 tablet (20 mg total) by mouth every morning., Disp: 90 each, Rfl: 3 .  simvastatin (ZOCOR) 10 MG tablet, Take 1 tablet (10 mg total) by mouth daily at 6 PM., Disp: 90 tablet, Rfl: 3  Allergies  Allergen Reactions  . Ferumoxytol Swelling    Swelling in neck  . Other     Other reaction(s): Other (See Comments) She refuses blood products-Jehovah witness  . Penicillins Rash, Swelling and Hives     Review of Systems  Constitutional: Negative  for fever, chills, weight loss and malaise/fatigue.  HENT: Negative for hearing loss.   Eyes: Negative for blurred vision and double vision.  Respiratory: Negative for cough, shortness of breath and wheezing.   Cardiovascular: Negative for chest pain, palpitations and leg swelling.  Gastrointestinal: Negative for heartburn, abdominal pain and blood in stool.  Genitourinary: Negative for dysuria, urgency and frequency.  Musculoskeletal: Negative for myalgias and joint pain.  Skin: Negative for rash.  Neurological: Negative for dizziness, tremors, weakness and headaches.      Objective  Filed Vitals:   04/12/15 0800  BP: 139/79  Pulse: 91  Temp: 98.7 F (37.1 C)  TempSrc: Oral  Resp: 16  Height: '5\' 1"'  (1.549 m)  Weight: 226 lb 12.8 oz (102.876 kg)    Physical Exam  Constitutional: She is oriented to person, place, and time and well-developed,  well-nourished, and in no distress. No distress.  HENT:  Head: Normocephalic and atraumatic.  Eyes: Conjunctivae and EOM are normal. Pupils are equal, round, and reactive to light. No scleral icterus.  Neck: Normal range of motion. Neck supple. Carotid bruit is not present. No thyromegaly present.  Cardiovascular: Normal rate and regular rhythm.  Exam reveals no gallop and no friction rub.   Murmur heard. Pulmonary/Chest: Effort normal and breath sounds normal. No respiratory distress. She has no wheezes. She has no rales.  Abdominal: Soft. Bowel sounds are normal. She exhibits no distension, no abdominal bruit and no mass. There is no tenderness.  Musculoskeletal: She exhibits edema (trace bilateral pedal edema.).  Lymphadenopathy:    She has no cervical adenopathy.  Neurological: She is alert and oriented to person, place, and time.  Vitals reviewed.      Recent Results (from the past 2160 hour(s))  Comprehensive metabolic panel     Status: Abnormal   Collection Time: 02/28/15 11:53 AM  Result Value Ref Range   Sodium 140 135 - 145 mmol/L   Potassium 3.8 3.5 - 5.1 mmol/L   Chloride 105 101 - 111 mmol/L   CO2 24 22 - 32 mmol/L   Glucose, Bld 105 (H) 65 - 99 mg/dL   BUN 20 6 - 20 mg/dL   Creatinine, Ser 0.89 0.44 - 1.00 mg/dL   Calcium 9.1 8.9 - 10.3 mg/dL   Total Protein 8.2 (H) 6.5 - 8.1 g/dL   Albumin 3.6 3.5 - 5.0 g/dL   AST 55 (H) 15 - 41 U/L   ALT 30 14 - 54 U/L   Alkaline Phosphatase 55 38 - 126 U/L   Total Bilirubin 0.5 0.3 - 1.2 mg/dL   GFR calc non Af Amer >60 >60 mL/min   GFR calc Af Amer >60 >60 mL/min    Comment: (NOTE) The eGFR has been calculated using the CKD EPI equation. This calculation has not been validated in all clinical situations. eGFR's persistently <60 mL/min signify possible Chronic Kidney Disease.    Anion gap 11 5 - 15  CBC with Differential/Platelet     Status: Abnormal   Collection Time: 02/28/15 11:53 AM  Result Value Ref Range   WBC  3.8 3.6 - 11.0 K/uL   RBC 3.41 (L) 3.80 - 5.20 MIL/uL   Hemoglobin 8.3 (L) 12.0 - 16.0 g/dL   HCT 26.8 (L) 35.0 - 47.0 %   MCV 78.4 (L) 80.0 - 100.0 fL   MCH 24.2 (L) 26.0 - 34.0 pg   MCHC 30.9 (L) 32.0 - 36.0 g/dL   RDW 18.1 (H) 11.5 - 14.5 %  Platelets 291 150 - 440 K/uL   Neutrophils Relative % 50% %   Neutro Abs 1.9 1.4 - 6.5 K/uL   Lymphocytes Relative 36% %   Lymphs Abs 1.4 1.0 - 3.6 K/uL   Monocytes Relative 11% %   Monocytes Absolute 0.4 0.2 - 0.9 K/uL   Eosinophils Relative 3% %   Eosinophils Absolute 0.1 0 - 0.7 K/uL   Basophils Relative 0% %   Basophils Absolute 0.0 0 - 0.1 K/uL  Lactate dehydrogenase     Status: None   Collection Time: 02/28/15 11:53 AM  Result Value Ref Range   LDH 134 98 - 192 U/L  Iron and TIBC     Status: Abnormal   Collection Time: 02/28/15 11:53 AM  Result Value Ref Range   Iron 61 28 - 170 ug/dL   TIBC 473 (H) 250 - 450 ug/dL   Saturation Ratios 13 10.4 - 31.8 %   UIBC 412 ug/dL  Haptoglobin     Status: Abnormal   Collection Time: 02/28/15 11:53 AM  Result Value Ref Range   Haptoglobin 226 (H) 34 - 200 mg/dL    Comment: (NOTE) Performed At: [facility] [place], [place] [id number] Lindon Romp MD VH:[id number]   Ferritin     Status: None   Collection Time: 02/28/15 11:53 AM  Result Value Ref Range   Ferritin 14 11 - 307 ng/mL  Soluble transferrin receptor     Status: Abnormal   Collection Time: 02/28/15 11:53 AM  Result Value Ref Range   Transferrin Receptor 37.5 (H) 12.2 - 27.3 nmol/L    Comment: (NOTE) Performed At: [facility] [place], [place] [id number] Lindon Romp MD UV:[id number]   Reticulocytes     Status: Abnormal   Collection Time: 02/28/15 11:53 AM  Result Value Ref Range   Retic Ct Pct 1.8 0.4 - 3.1 %   RBC. 3.41 (L) 3.80 - 5.20 MIL/uL   Retic Count, Manual 61.4 19.0 - 183.0 K/uL  Celiac Disease Panel     Status: Abnormal   Collection Time: 02/28/15 11:53  AM  Result Value Ref Range   Endomysial Ab, IgA Negative Negative   Tissue Transglutaminase Ab, IgA <2 0 - 3 U/mL    Comment: (NOTE)                              Negative        0 -  3                              Weak Positive   4 - 10                              Positive           >10 Tissue Transglutaminase (tTG) has been identified as the endomysial antigen.  Studies have demonstr- ated that endomysial IgA antibodies have over 99% specificity for gluten sensitive enteropathy.    IgA 623 (H) 87 - 352 mg/dL    Comment: (NOTE) Performed At: [facility] [place], [place] [id number] Lindon Romp MD VF:[id number]   Urinalysis complete, with microscopic [facility] only)     Status: Abnormal   Collection Time: 02/28/15 11:55 AM  Result Value Ref Range   Color, Urine YELLOW YELLOW  APPearance CLEAR CLEAR   Glucose, UA NEGATIVE NEGATIVE mg/dL   Bilirubin Urine NEGATIVE NEGATIVE   Ketones, ur NEGATIVE NEGATIVE mg/dL   Specific Gravity, Urine 1.025 1.005 - 1.030   Hgb urine dipstick NEGATIVE NEGATIVE   pH 5.5 5.0 - 8.0   Protein, ur NEGATIVE NEGATIVE mg/dL   Nitrite NEGATIVE NEGATIVE   Leukocytes, UA NEGATIVE NEGATIVE   RBC / HPF 0-5 <3 RBC/hpf   WBC, UA 0-5 <3 WBC/hpf   Bacteria, UA FEW (A) RARE   Squamous Epithelial / LPF 0-5 (A) RARE  Glucose, capillary     Status: Abnormal   Collection Time: 03/13/15  9:05 AM  Result Value Ref Range   Glucose-Capillary 135 (H) 65 - 99 mg/dL  Surgical pathology     Status: None   Collection Time: 03/13/15 10:00 AM  Result Value Ref Range   SURGICAL PATHOLOGY      Surgical Pathology CASE: [id number] PATIENT: Diana Harmon Surgical Pathology Report     SPECIMEN SUBMITTED: A. Duodenum, cbx  CLINICAL HISTORY: None provided  PRE-OPERATIVE DIAGNOSIS: IDA, nausea  POST-OPERATIVE DIAGNOSIS: Minimum duodenitis AVM's of colon     DIAGNOSIS: A.  DUODENUM; COLD BIOPSY: - DUODENAL MUCOSA WITH NONSPECIFIC  MILD CHRONIC GASTRITIS. - NEGATIVE FOR DYSPLASIA AND MALIGNANCY. - FEATURES OF CELIAC ARE NOT IDENTIFIED.  GROSS DESCRIPTION:  A. Labeled: C biopsy duodenum  Tissue fragment(s): 2  Size: 0.25 and 0.3 cm  Description: Tan  Entirely submitted in one cassette(s).    Final Diagnosis performed by Quay Burow, MD.  Electronically signed 03/14/2015 46:27:03JK    The electronic signature indicates that the named Attending Pathologist has evaluated the specimen  Technical component performed at [facility], [address] [place], [place] [address] Lab: [phone] Dir: Darrick Penna. Evette Doffing, MD  Professional componen t performed at [facility], [facility], [place], [place], [place] [address] Lab: [phone] Dir: Dellia Nims. Rubinas, MD    CBC with Differential/Platelet     Status: Abnormal   Collection Time: 04/11/15  9:43 AM  Result Value Ref Range   WBC 3.4 (L) 3.6 - 11.0 K/uL   RBC 3.68 (L) 3.80 - 5.20 MIL/uL   Hemoglobin 10.0 (L) 12.0 - 16.0 g/dL   HCT 31.3 (L) 35.0 - 47.0 %   MCV 85.1 80.0 - 100.0 fL   MCH 27.1 26.0 - 34.0 pg   MCHC 31.8 (L) 32.0 - 36.0 g/dL   RDW 24.5 (H) 11.5 - 14.5 %   Platelets 193 150 - 440 K/uL   Neutrophils Relative % 42 %   Neutro Abs 1.4 1.4 - 6.5 K/uL   Lymphocytes Relative 38 %   Lymphs Abs 1.3 1.0 - 3.6 K/uL   Monocytes Relative 15 %   Monocytes Absolute 0.5 0.2 - 0.9 K/uL   Eosinophils Relative 3 %   Eosinophils Absolute 0.1 0 - 0.7 K/uL   Basophils Relative 2 %   Basophils Absolute 0.1 0 - 0.1 K/uL  POCT HgB A1C     Status: Normal   Collection Time: 04/12/15  8:14 AM  Result Value Ref Range   Hemoglobin A1C 6.1      Assessment & Plan  Problem List Items Addressed This Visit      Cardiovascular and Mediastinum   Essential (primary) hypertension     Endocrine   Diabetes mellitus, type 2 ([place]) - Primary   Relevant Orders   POCT HgB A1C (Completed)     Musculoskeletal and Integument   Arthritis,  degenerative  Other   Hypercholesteremia   Other iron deficiency anemias      No orders of the defined types were placed in this encounter.   1. Type 2 diabetes mellitus without complication, unspecified long term insulin use status (HCC)  - POCT HgB A1C-6.1 -cont. meds 2. Essential (primary) hypertension  -cont. meds 3. Other iron deficiency anemias  -cont. To see Hematology 4. Hypercholesteremia  -cont meds 5. Primary osteoarthritis involving multiple joints

## 2015-04-18 ENCOUNTER — Inpatient Hospital Stay: Payer: Medicare Other

## 2015-04-18 VITALS — BP 158/72 | HR 74 | Temp 96.2°F | Resp 20

## 2015-04-18 DIAGNOSIS — D508 Other iron deficiency anemias: Secondary | ICD-10-CM

## 2015-04-18 DIAGNOSIS — D509 Iron deficiency anemia, unspecified: Secondary | ICD-10-CM | POA: Diagnosis not present

## 2015-04-18 DIAGNOSIS — E785 Hyperlipidemia, unspecified: Secondary | ICD-10-CM | POA: Diagnosis not present

## 2015-04-18 DIAGNOSIS — E079 Disorder of thyroid, unspecified: Secondary | ICD-10-CM | POA: Diagnosis not present

## 2015-04-18 DIAGNOSIS — Z79899 Other long term (current) drug therapy: Secondary | ICD-10-CM | POA: Diagnosis not present

## 2015-04-18 DIAGNOSIS — Z7984 Long term (current) use of oral hypoglycemic drugs: Secondary | ICD-10-CM | POA: Diagnosis not present

## 2015-04-18 DIAGNOSIS — R110 Nausea: Secondary | ICD-10-CM | POA: Diagnosis not present

## 2015-04-18 DIAGNOSIS — I10 Essential (primary) hypertension: Secondary | ICD-10-CM | POA: Diagnosis not present

## 2015-04-18 DIAGNOSIS — E119 Type 2 diabetes mellitus without complications: Secondary | ICD-10-CM | POA: Diagnosis not present

## 2015-04-18 MED ORDER — SODIUM CHLORIDE 0.9 % IV SOLN
200.0000 mg | Freq: Once | INTRAVENOUS | Status: AC
  Administered 2015-04-18: 200 mg via INTRAVENOUS
  Filled 2015-04-18: qty 10

## 2015-04-18 MED ORDER — SODIUM CHLORIDE 0.9 % IV SOLN
Freq: Once | INTRAVENOUS | Status: AC
  Administered 2015-04-18: 11:00:00 via INTRAVENOUS
  Filled 2015-04-18: qty 1000

## 2015-05-02 ENCOUNTER — Encounter: Payer: Self-pay | Admitting: Family Medicine

## 2015-05-03 ENCOUNTER — Encounter: Payer: Self-pay | Admitting: Family Medicine

## 2015-06-15 DIAGNOSIS — E113393 Type 2 diabetes mellitus with moderate nonproliferative diabetic retinopathy without macular edema, bilateral: Secondary | ICD-10-CM | POA: Diagnosis not present

## 2015-07-07 ENCOUNTER — Other Ambulatory Visit: Payer: Medicare Other

## 2015-07-10 ENCOUNTER — Inpatient Hospital Stay: Payer: Medicare Other | Attending: Internal Medicine

## 2015-07-10 DIAGNOSIS — E119 Type 2 diabetes mellitus without complications: Secondary | ICD-10-CM | POA: Insufficient documentation

## 2015-07-10 DIAGNOSIS — Z79899 Other long term (current) drug therapy: Secondary | ICD-10-CM | POA: Diagnosis not present

## 2015-07-10 DIAGNOSIS — Z7984 Long term (current) use of oral hypoglycemic drugs: Secondary | ICD-10-CM | POA: Diagnosis not present

## 2015-07-10 DIAGNOSIS — I10 Essential (primary) hypertension: Secondary | ICD-10-CM | POA: Insufficient documentation

## 2015-07-10 DIAGNOSIS — E785 Hyperlipidemia, unspecified: Secondary | ICD-10-CM | POA: Insufficient documentation

## 2015-07-10 DIAGNOSIS — D509 Iron deficiency anemia, unspecified: Secondary | ICD-10-CM | POA: Diagnosis not present

## 2015-07-10 DIAGNOSIS — R1013 Epigastric pain: Secondary | ICD-10-CM | POA: Diagnosis not present

## 2015-07-10 DIAGNOSIS — E079 Disorder of thyroid, unspecified: Secondary | ICD-10-CM | POA: Diagnosis not present

## 2015-07-10 LAB — CBC WITH DIFFERENTIAL/PLATELET
Basophils Absolute: 0 10*3/uL (ref 0–0.1)
Basophils Relative: 1 %
Eosinophils Absolute: 0.1 10*3/uL (ref 0–0.7)
Eosinophils Relative: 4 %
HCT: 36.2 % (ref 35.0–47.0)
HEMOGLOBIN: 11.6 g/dL — AB (ref 12.0–16.0)
LYMPHS ABS: 1.4 10*3/uL (ref 1.0–3.6)
LYMPHS PCT: 41 %
MCH: 30.2 pg (ref 26.0–34.0)
MCHC: 32.1 g/dL (ref 32.0–36.0)
MCV: 94.1 fL (ref 80.0–100.0)
Monocytes Absolute: 0.5 10*3/uL (ref 0.2–0.9)
Monocytes Relative: 17 %
NEUTROS PCT: 37 %
Neutro Abs: 1.2 10*3/uL — ABNORMAL LOW (ref 1.4–6.5)
Platelets: 216 10*3/uL (ref 150–440)
RBC: 3.85 MIL/uL (ref 3.80–5.20)
RDW: 13.8 % (ref 11.5–14.5)
WBC: 3.3 10*3/uL — AB (ref 3.6–11.0)

## 2015-07-10 LAB — FERRITIN: FERRITIN: 231 ng/mL (ref 11–307)

## 2015-07-11 ENCOUNTER — Inpatient Hospital Stay (HOSPITAL_BASED_OUTPATIENT_CLINIC_OR_DEPARTMENT_OTHER): Payer: Medicare Other | Admitting: Internal Medicine

## 2015-07-11 ENCOUNTER — Inpatient Hospital Stay: Payer: Medicare Other

## 2015-07-11 VITALS — BP 144/75 | HR 84 | Temp 98.2°F | Resp 18 | Wt 231.5 lb

## 2015-07-11 DIAGNOSIS — E079 Disorder of thyroid, unspecified: Secondary | ICD-10-CM | POA: Diagnosis not present

## 2015-07-11 DIAGNOSIS — D509 Iron deficiency anemia, unspecified: Secondary | ICD-10-CM

## 2015-07-11 DIAGNOSIS — Z7984 Long term (current) use of oral hypoglycemic drugs: Secondary | ICD-10-CM

## 2015-07-11 DIAGNOSIS — Z79899 Other long term (current) drug therapy: Secondary | ICD-10-CM | POA: Diagnosis not present

## 2015-07-11 DIAGNOSIS — R1013 Epigastric pain: Secondary | ICD-10-CM | POA: Diagnosis not present

## 2015-07-11 DIAGNOSIS — E785 Hyperlipidemia, unspecified: Secondary | ICD-10-CM | POA: Diagnosis not present

## 2015-07-11 DIAGNOSIS — E119 Type 2 diabetes mellitus without complications: Secondary | ICD-10-CM

## 2015-07-11 DIAGNOSIS — I10 Essential (primary) hypertension: Secondary | ICD-10-CM

## 2015-07-11 NOTE — Progress Notes (Signed)
Pt here for anemia today and possible venofer infusion.Pt in a W/C today. Report occ lightheadedness. Dyspnea with exertion. Chronic pain in low back and hips 8/10. Appetite is fair to low. Bowels normal. Denies blood in stools. Pt is taking iron pills twice a day.

## 2015-07-11 NOTE — Progress Notes (Signed)
[place] NOTE  Patient Care Team: Arlis Porta., MD as PCP - General (Family Medicine)  CHIEF COMPLAINTS/PURPOSE OF CONSULTATION:   # AUG 2016- IRON DEFICIENCY ANEMIA- Possible AVMs of colon [s/p Argon laser; Dr.Elliot;EGD-neg Nov 2016] s/p IV Ferriheme x2 [Nov2016]  # Rectal well diff carcinoid [1.2CM; s/p polypectomy 2011]; Allergy- [facility] Philomena Course 2016]- neck/face swelling [improved with benadryl]  HISTORY OF PRESENTING ILLNESS:  Diana Harmon 70 y.o.  female is here for follow-up for her iron deficiency anemia.   Patient's complaints of intermittent loose stools/dyspepsia- which is currently improved on probiotics. Otherwise denies any blood in stools. She has black colored stools from Taking by mouth iron. Intermittent nausea. Her energy levels are adequate.   ROS: A complete 10 point review of system is done which is negative except mentioned above in history of present illness  MEDICAL HISTORY:  Past Medical History  Diagnosis Date  . Thyroid disease   . Benign tumor   . Diabetes mellitus without complication ([place])   . Nausea   . Anemia   . Patient is [name]   . Hypertension   . Hyperlipidemia     SURGICAL HISTORY: Past Surgical History  Procedure Laterality Date  . Tumor removed    . Thyroidectomy    . Colonoscopy    . Colonoscopy with propofol N/A 03/13/2015    Procedure: COLONOSCOPY WITH PROPOFOL;  Surgeon: Manya Silvas, MD;  Location: [facility] ENDOSCOPY;  Service: Endoscopy;  Laterality: N/A;  . Esophagogastroduodenoscopy N/A 03/13/2015    Procedure: ESOPHAGOGASTRODUODENOSCOPY (EGD);  Surgeon: Manya Silvas, MD;  Location: [facility] ENDOSCOPY;  Service: Endoscopy;  Laterality: N/A;    SOCIAL HISTORY: Social History   Social History  . Marital Status: Widowed    Spouse Name: N/A  . Number of Children: N/A  . Years of Education: N/A   Occupational History  . Not on file.   Social History Main Topics  .  Smoking status: Never Smoker   . Smokeless tobacco: Never Used  . Alcohol Use: 0.6 oz/week    1 Glasses of wine per week     Comment: occ. wine  . Drug Use: No  . Sexual Activity: Not on file   Other Topics Concern  . Not on file   Social History Narrative    FAMILY HISTORY: Family History  Problem Relation Age of Onset  . Parkinson's disease Mother   . Heart disease Father   . Lupus Sister   . Aneurysm Son   . Lupus Son     ALLERGIES:  is allergic to ferumoxytol; other; and penicillins.  MEDICATIONS:  Current Outpatient Prescriptions  Medication Sig Dispense Refill  . acetaminophen (TYLENOL) 500 MG tablet Take 1,000 mg by mouth every 6 (six) hours as needed for moderate pain.    . ferrous sulfate 325 (65 FE) MG tablet Take 1 tablet (325 mg total) by mouth 2 (two) times daily with a meal. 60 tablet 6  . glimepiride (AMARYL) 2 MG tablet Take 1 tablet (2 mg total) by mouth daily before breakfast. 90 tablet 3  . hydrochlorothiazide (HYDRODIURIL) 12.5 MG tablet Take 1 tablet (12.5 mg total) by mouth daily. 90 tablet 3  . losartan (COZAAR) 50 MG tablet Take 1 tablet (50 mg total) by mouth daily. 90 tablet 3  . metFORMIN (GLUCOPHAGE) 500 MG tablet Take 2 tablets (1,000 mg total) by mouth 2 (two) times daily with a meal. 360 tablet 1  . Omeprazole 20 MG  TBEC Take 1 tablet (20 mg total) by mouth every morning. 90 each 3  . simvastatin (ZOCOR) 10 MG tablet Take 1 tablet (10 mg total) by mouth daily at 6 PM. 90 tablet 3   No current facility-administered medications for this visit.      Marland Kitchen  PHYSICAL EXAMINATION: ECOG PERFORMANCE STATUS: 1 - Symptomatic but completely ambulatory  Filed Vitals:   07/11/15 1044  BP: 144/75  Pulse: 84  Temp: 98.2 F (36.8 C)  Resp: 18   Filed Weights   07/11/15 1044  Weight: 231 lb 7.7 oz (105 kg)    GENERAL: Well-nourished well-developed; Alert, no distress and comfortable. Walks with a cane. Obese. Currently in a wheel chair. EYES:  no pallor or icterus OROPHARYNX: no thrush or ulceration; good dentition  NECK: supple, no masses felt LYMPH: no palpable lymphadenopathy in the cervical, axillary or inguinal regions LUNGS: clear to auscultation and No wheeze or crackles HEART/CVS: regular rate & rhythm and no murmurs; No lower extremity edema ABDOMEN: abdomen soft, non-tender and normal bowel sounds Musculoskeletal:no cyanosis of digits and no clubbing  PSYCH: alert & oriented x 3 with fluent speech NEURO: no focal motor/sensory deficits SKIN: no rashes or significant lesions LABORATORY DATA:  I have reviewed the data as listed Lab Results  Component Value Date   WBC 3.3* 07/10/2015   HGB 11.6* 07/10/2015   HCT 36.2 07/10/2015   MCV 94.1 07/10/2015   PLT 216 07/10/2015    Recent Labs  09/19/14 12/06/14 0846 02/28/15 1153  NA  --   --  140  K 4.5  --  3.8  CL  --   --  105  CO2  --   --  24  GLUCOSE  --   --  105*  BUN 19  --  20  CREATININE 0.9  --  0.89  CALCIUM  --   --  9.1  GFRNONAA  --   --  >60  GFRAA  --   --  >60  PROT  --  8.2 8.2*  ALBUMIN  --  3.9 3.6  AST 73* 64* 55*  ALT 44* 43* 30  ALKPHOS 69 64 93  BILITOT  --  0.3 0.5  BILIDIR  --  0.15  --     ASSESSMENT & PLAN:   # Iron deficiency anemia-most likely secondary to AVMs of the colon [colonoscopy November 2016]; patient received IV iron/Feraheme in early November 2016. Today patient's hemoglobin is 11.6; ferritin is 231. Patient responded very well to IV iron.  # I reviewed the labs with the patient and her daughter in detail. I would recommend a follow-up again in 3 months/labs-CBC CMP ferritin; iron studies- and if needed IV Venofer at that time.   # Dyspepsia/diarrhea- question related to by mouth iron. Recommend taking in with orange juice/full stomach/also taking once a day.  # 15 minutes face-to-face with the patient discussing the above plan of care; more than 50% of time spent on prognosis/ natural history; counseling  and coordination.     Cammie Sickle, MD 07/11/2015 11:05 AM

## 2015-07-18 ENCOUNTER — Ambulatory Visit: Payer: Medicare Other | Admitting: Family Medicine

## 2015-07-19 ENCOUNTER — Ambulatory Visit (INDEPENDENT_AMBULATORY_CARE_PROVIDER_SITE_OTHER): Payer: Medicare Other | Admitting: Family Medicine

## 2015-07-19 ENCOUNTER — Encounter: Payer: Self-pay | Admitting: Family Medicine

## 2015-07-19 VITALS — BP 136/63 | HR 86 | Temp 98.5°F | Resp 16 | Ht 61.0 in | Wt 230.0 lb

## 2015-07-19 DIAGNOSIS — M1990 Unspecified osteoarthritis, unspecified site: Secondary | ICD-10-CM

## 2015-07-19 DIAGNOSIS — I10 Essential (primary) hypertension: Secondary | ICD-10-CM | POA: Diagnosis not present

## 2015-07-19 DIAGNOSIS — E7800 Pure hypercholesterolemia, unspecified: Secondary | ICD-10-CM | POA: Diagnosis not present

## 2015-07-19 DIAGNOSIS — E119 Type 2 diabetes mellitus without complications: Secondary | ICD-10-CM

## 2015-07-19 DIAGNOSIS — K219 Gastro-esophageal reflux disease without esophagitis: Secondary | ICD-10-CM | POA: Diagnosis not present

## 2015-07-19 LAB — POCT GLYCOSYLATED HEMOGLOBIN (HGB A1C): HEMOGLOBIN A1C: 6.8

## 2015-07-19 NOTE — Progress Notes (Signed)
Name: Diana Harmon   MRN: [id number]    DOB: 09-Nov-1945   Date:07/19/2015       Progress Note  Subjective  Chief Complaint  Chief Complaint  Patient presents with  . Diabetes    highest 147 and lowest 95 and avarage 115    HPI Here for f/u of Diabetes.  She has gained some weight since Dec.  Her BSs range from 95-145 fasting.  Avg about 115.    No problem-specific assessment & plan notes found for this encounter.   Past Medical History  Diagnosis Date  . Thyroid disease   . Benign tumor   . Diabetes mellitus without complication ([place])   . Nausea   . Anemia   . Patient is [name]   . Hypertension   . Hyperlipidemia     Past Surgical History  Procedure Laterality Date  . Tumor removed    . Thyroidectomy    . Colonoscopy    . Colonoscopy with propofol N/A 03/13/2015    Procedure: COLONOSCOPY WITH PROPOFOL;  Surgeon: Manya Silvas, MD;  Location: [facility] ENDOSCOPY;  Service: Endoscopy;  Laterality: N/A;  . Esophagogastroduodenoscopy N/A 03/13/2015    Procedure: ESOPHAGOGASTRODUODENOSCOPY (EGD);  Surgeon: Manya Silvas, MD;  Location: [facility] ENDOSCOPY;  Service: Endoscopy;  Laterality: N/A;    Family History  Problem Relation Age of Onset  . Parkinson's disease Mother   . Heart disease Father   . Lupus Sister   . Aneurysm Son   . Lupus Son     Social History   Social History  . Marital Status: Widowed    Spouse Name: N/A  . Number of Children: N/A  . Years of Education: N/A   Occupational History  . Not on file.   Social History Main Topics  . Smoking status: Never Smoker   . Smokeless tobacco: Never Used  . Alcohol Use: 0.6 oz/week    1 Glasses of wine per week     Comment: occ. wine  . Drug Use: No  . Sexual Activity: Not on file   Other Topics Concern  . Not on file   Social History Narrative     Current outpatient prescriptions:  .  acetaminophen (TYLENOL) 500 MG tablet, Take 1,000 mg by mouth every 6 (six) hours as  needed for moderate pain., Disp: , Rfl:  .  ferrous sulfate 325 (65 FE) MG tablet, Take 1 tablet (325 mg total) by mouth 2 (two) times daily with a meal., Disp: 60 tablet, Rfl: 6 .  glimepiride (AMARYL) 2 MG tablet, Take 1 tablet (2 mg total) by mouth daily before breakfast., Disp: 90 tablet, Rfl: 3 .  hydrochlorothiazide (HYDRODIURIL) 12.5 MG tablet, Take 1 tablet (12.5 mg total) by mouth daily., Disp: 90 tablet, Rfl: 3 .  lactobacillus acidophilus (BACID) TABS tablet, Take 2 tablets by mouth 2 (two) times daily., Disp: , Rfl:  .  losartan (COZAAR) 50 MG tablet, Take 1 tablet (50 mg total) by mouth daily., Disp: 90 tablet, Rfl: 3 .  metFORMIN (GLUCOPHAGE) 500 MG tablet, Take 2 tablets (1,000 mg total) by mouth 2 (two) times daily with a meal., Disp: 360 tablet, Rfl: 1 .  Omeprazole 20 MG TBEC, Take 1 tablet (20 mg total) by mouth every morning., Disp: 90 each, Rfl: 3 .  simvastatin (ZOCOR) 10 MG tablet, Take 1 tablet (10 mg total) by mouth daily at 6 PM., Disp: 90 tablet, Rfl: 3  Allergies  Allergen Reactions  . Ferumoxytol Swelling  Swelling in neck  . Other     Other reaction(s): Other (See Comments) She refuses blood products-Jehovah witness  . Penicillins Rash, Swelling and Hives     Review of Systems  Constitutional: Negative for fever, chills, weight loss and malaise/fatigue.  HENT: Negative for hearing loss.   Eyes: Negative for blurred vision and double vision.  Respiratory: Negative for cough, shortness of breath and wheezing.   Cardiovascular: Negative for chest pain, palpitations and leg swelling.  Gastrointestinal: Negative for heartburn, abdominal pain and blood in stool.  Genitourinary: Negative for dysuria, urgency and frequency.  Skin: Negative for rash.  Neurological: Negative for tremors, weakness and headaches.      Objective  Filed Vitals:   07/19/15 0834  BP: 136/63  Pulse: 86  Temp: 98.5 F (36.9 C)  TempSrc: Oral  Resp: 16  Height: 5\' 1"  (1.549  m)  Weight: 230 lb (104.327 kg)  SpO2: 99%    Physical Exam  Constitutional: She is oriented to person, place, and time and well-developed, well-nourished, and in no distress. No distress.  HENT:  Head: Normocephalic and atraumatic.  Eyes: Conjunctivae and EOM are normal. Pupils are equal, round, and reactive to light. No scleral icterus.  Neck: Normal range of motion. Neck supple. Carotid bruit is not present. No thyromegaly present.  Cardiovascular: Normal rate and regular rhythm.  Exam reveals gallop and friction rub.   Murmur heard.  Systolic murmur is present with a grade of 2/6  LLSB  Pulmonary/Chest: Effort normal and breath sounds normal. No respiratory distress. She has no wheezes. She has no rales.  Abdominal: Soft. Bowel sounds are normal. She exhibits no distension and no mass. There is no tenderness.  obese  Musculoskeletal: She exhibits no edema.  Lymphadenopathy:    She has no cervical adenopathy.  Neurological: She is alert and oriented to person, place, and time.  Vitals reviewed.      Recent Results (from the past 2160 hour(s))  CBC with Differential     Status: Abnormal   Collection Time: 07/10/15  9:40 AM  Result Value Ref Range   WBC 3.3 (L) 3.6 - 11.0 K/uL   RBC 3.85 3.80 - 5.20 MIL/uL   Hemoglobin 11.6 (L) 12.0 - 16.0 g/dL   HCT 36.2 35.0 - 47.0 %   MCV 94.1 80.0 - 100.0 fL   MCH 30.2 26.0 - 34.0 pg   MCHC 32.1 32.0 - 36.0 g/dL   RDW 13.8 11.5 - 14.5 %   Platelets 216 150 - 440 K/uL   Neutrophils Relative % 37 %   Neutro Abs 1.2 (L) 1.4 - 6.5 K/uL   Lymphocytes Relative 41 %   Lymphs Abs 1.4 1.0 - 3.6 K/uL   Monocytes Relative 17 %   Monocytes Absolute 0.5 0.2 - 0.9 K/uL   Eosinophils Relative 4 %   Eosinophils Absolute 0.1 0 - 0.7 K/uL   Basophils Relative 1 %   Basophils Absolute 0.0 0 - 0.1 K/uL  Ferritin     Status: None   Collection Time: 07/10/15  9:40 AM  Result Value Ref Range   Ferritin 231 11 - 307 ng/mL  POCT HgB A1C     Status:  Abnormal   Collection Time: 07/19/15  8:41 AM  Result Value Ref Range   Hemoglobin A1C 6.8      Assessment & Plan  Problem List Items Addressed This Visit      Cardiovascular and Mediastinum   Essential (primary) hypertension  Digestive   Acid reflux   Relevant Medications   lactobacillus acidophilus (BACID) TABS tablet     Endocrine   Diabetes mellitus, type 2 (HCC) - Primary   Relevant Orders   POCT HgB A1C (Completed)     Musculoskeletal and Integument   Arthritis     Other   Hypercholesteremia   Extreme obesity ([place])      Meds ordered this encounter  Medications  . lactobacillus acidophilus (BACID) TABS tablet    Sig: Take 2 tablets by mouth 2 (two) times daily.   1. Type 2 diabetes mellitus without complication, unspecified long term insulin use status (HCC) Cont Metformin and Amaryl - POCT HgB A1C-6.8 Return 3 months 2. Essential (primary) hypertension Cont  Losartan and HCTZ.  3. Gastroesophageal reflux disease without esophagitis Cont. Omeprazole qod  4. Arthritis Cont. Tylenol  5. Hypercholesteremia Cont. Simvastatin  6. Morbid obesity due to excess calories ([employer])

## 2015-09-22 ENCOUNTER — Telehealth: Payer: Self-pay | Admitting: Family Medicine

## 2015-09-22 NOTE — Telephone Encounter (Signed)
Dr. Luan Pulling will order labs on 10/09/15 visit.[place]

## 2015-09-22 NOTE — Telephone Encounter (Signed)
Pt asked if she was suppose to do labs for Dr. Luan Pulling on June 5th.  Her call back number is [phone]

## 2015-10-10 ENCOUNTER — Encounter: Payer: Self-pay | Admitting: Gynecology

## 2015-10-10 ENCOUNTER — Ambulatory Visit
Admission: EM | Admit: 2015-10-10 | Discharge: 2015-10-10 | Disposition: A | Discharge status: Home / Self Care | Payer: Medicare Other | Attending: Family Medicine | Admitting: Family Medicine

## 2015-10-10 DIAGNOSIS — N390 Urinary tract infection, site not specified: Secondary | ICD-10-CM | POA: Diagnosis not present

## 2015-10-10 LAB — URINALYSIS COMPLETE WITH MICROSCOPIC (ARMC ONLY)
BILIRUBIN URINE: NEGATIVE
GLUCOSE, UA: NEGATIVE mg/dL
HGB URINE DIPSTICK: NEGATIVE
KETONES UR: NEGATIVE mg/dL
Leukocytes, UA: NEGATIVE
NITRITE: NEGATIVE
Protein, ur: NEGATIVE mg/dL
Specific Gravity, Urine: 1.015 (ref 1.005–1.030)
pH: 6 (ref 5.0–8.0)

## 2015-10-10 MED ORDER — NITROFURANTOIN MONOHYD MACRO 100 MG PO CAPS: 100.0000 mg | ORAL_CAPSULE | Freq: Two times a day (BID) | ORAL | Status: DC

## 2015-10-10 NOTE — ED Provider Notes (Signed)
CSN: [id number]     Arrival date & time 10/10/15  1425 History   First MD Initiated Contact with Patient 10/10/15 1510     Chief Complaint  Patient presents with  . Hematuria   (Consider location/radiation/quality/duration/timing/severity/associated sxs/prior Treatment) HPI  This a 70 year old female who is accompanied by her daughter leaning of several days history of urgency frequency in satiety and hematuria. States that she had a UTI years ago. She is diabetic oral hypoglycemics. She Denies any fever or chills. His had no back pain. Mentions some vaginal itching but she's had this for a long period of time.It will comes and go.     Past Medical History  Diagnosis Date  . Thyroid disease   . Benign tumor   . Diabetes mellitus without complication ([place])   . Nausea   . Anemia   . Patient is [name]   . Hypertension   . Hyperlipidemia    Past Surgical History  Procedure Laterality Date  . Tumor removed    . Thyroidectomy    . Colonoscopy    . Colonoscopy with propofol N/A 03/13/2015    Procedure: COLONOSCOPY WITH PROPOFOL;  Surgeon: Manya Silvas, MD;  Location: [facility] ENDOSCOPY;  Service: Endoscopy;  Laterality: N/A;  . Esophagogastroduodenoscopy N/A 03/13/2015    Procedure: ESOPHAGOGASTRODUODENOSCOPY (EGD);  Surgeon: Manya Silvas, MD;  Location: [facility] ENDOSCOPY;  Service: Endoscopy;  Laterality: N/A;   Family History  Problem Relation Age of Onset  . Parkinson's disease Mother   . Heart disease Father   . Lupus Sister   . Aneurysm Son   . Lupus Son    Social History  Substance Use Topics  . Smoking status: Never Smoker   . Smokeless tobacco: Never Used  . Alcohol Use: 0.6 oz/week    1 Glasses of wine per week     Comment: occ. wine   OB History    No data available     Review of Systems  Constitutional: Negative for fever, chills, activity change and fatigue.  Genitourinary: Positive for dysuria, urgency, frequency and hematuria.  All other  systems reviewed and are negative.   Allergies  Ferumoxytol; Other; and Penicillins  Home Medications   Prior to Admission medications   Medication Sig Start Date End Date Taking? Authorizing Provider  acetaminophen (TYLENOL) 500 MG tablet Take 1,000 mg by mouth every 6 (six) hours as needed for moderate pain.   Yes Historical Provider, MD  ferrous sulfate 325 (65 FE) MG tablet Take 1 tablet (325 mg total) by mouth 2 (two) times daily with a meal. 01/05/15  Yes Arlis Porta., MD  glimepiride (AMARYL) 2 MG tablet Take 1 tablet (2 mg total) by mouth daily before breakfast. 12/05/14  Yes Arlis Porta., MD  hydrochlorothiazide (HYDRODIURIL) 12.5 MG tablet Take 1 tablet (12.5 mg total) by mouth daily. 12/05/14  Yes Arlis Porta., MD  lactobacillus acidophilus (BACID) TABS tablet Take 2 tablets by mouth 2 (two) times daily.   Yes Historical Provider, MD  losartan (COZAAR) 50 MG tablet Take 1 tablet (50 mg total) by mouth daily. 12/05/14  Yes Arlis Porta., MD  metFORMIN (GLUCOPHAGE) 500 MG tablet Take 2 tablets (1,000 mg total) by mouth 2 (two) times daily with a meal. 02/13/15  Yes Arlis Porta., MD  Omeprazole 20 MG TBEC Take 1 tablet (20 mg total) by mouth every morning. 12/05/14  Yes Arlis Porta., MD  simvastatin (ZOCOR) 10  MG tablet Take 1 tablet (10 mg total) by mouth daily at 6 PM. 12/05/14  Yes Arlis Porta., MD  nitrofurantoin, macrocrystal-monohydrate, (MACROBID) 100 MG capsule Take 1 capsule (100 mg total) by mouth 2 (two) times daily. 10/10/15   Lorin Picket, PA-C   Meds Ordered and Administered this Visit  Medications - No data to display  BP 144/65 mmHg  Pulse 89  Temp(Src) 98.5 F (36.9 C) (Oral)  Resp 18  Ht 5\' 2"  (1.575 m)  Wt 231 lb (104.781 kg)  BMI 42.24 kg/m2  SpO2 100% No data found.   Physical Exam  Constitutional: She is oriented to person, place, and time. She appears well-developed and well-nourished. No distress.  HENT:   Head: Normocephalic and atraumatic.  Eyes: Conjunctivae are normal. Pupils are equal, round, and reactive to light.  Neck: Normal range of motion. Neck supple.  Musculoskeletal: Normal range of motion.  Neurological: She is alert and oriented to person, place, and time.  Skin: Skin is warm and dry. She is not diaphoretic.  Psychiatric: She has a normal mood and affect. Her behavior is normal. Judgment and thought content normal.  Nursing note and vitals reviewed.   ED Course  Procedures (including critical care time)  Labs Review Labs Reviewed  URINALYSIS COMPLETEWITH MICROSCOPIC ([facility] ONLY) - Abnormal; Notable for the following:    APPearance HAZY (*)    Bacteria, UA MANY (*)    Squamous Epithelial / LPF 6-30 (*)    All other components within normal limits    Imaging Review No results found.   Visual Acuity Review  Right Eye Distance:   Left Eye Distance:   Bilateral Distance:    Right Eye Near:   Left Eye Near:    Bilateral Near:         MDM   1. UTI (lower urinary tract infection)    Discharge Medication List as of 10/10/2015  3:51 PM    START taking these medications   Details  nitrofurantoin, macrocrystal-monohydrate, (MACROBID) 100 MG capsule Take 1 capsule (100 mg total) by mouth 2 (two) times daily., Starting 10/10/2015, Until Discontinued, Normal      Plan: 1. Test/x-ray results and diagnosis reviewed with patient 2. rx as per orders; risks, benefits, potential side effects reviewed with patient 3. Recommend supportive treatment with fluids. Follow-up with her primary care physician, Dr. Luan Pulling and she states she has an appointment with him on June 22. I asked them to call in 48 hours if they do not hear from [place] regarding the outcome of the cultures and sensitivity on the urine. She needs to bring up her complaints of itching with Dr. Luan Pulling when she returns there. If she is not improving or is worsening she should follow-up in the emergency room. 4.  F/u prn if symptoms worsen or don't improve   Lorin Picket, PA-C 10/10/15 1605

## 2015-10-10 NOTE — ED Notes (Signed)
Patient c/o Blood in her urine x couple days and itching couple days.

## 2015-10-10 NOTE — Discharge Instructions (Signed)

## 2015-10-12 ENCOUNTER — Ambulatory Visit: Payer: Medicare Other | Admitting: Family Medicine

## 2015-10-13 ENCOUNTER — Inpatient Hospital Stay: Payer: Medicare Other | Attending: Internal Medicine

## 2015-10-13 DIAGNOSIS — Z7984 Long term (current) use of oral hypoglycemic drugs: Secondary | ICD-10-CM | POA: Insufficient documentation

## 2015-10-13 DIAGNOSIS — E079 Disorder of thyroid, unspecified: Secondary | ICD-10-CM | POA: Diagnosis not present

## 2015-10-13 DIAGNOSIS — Z8589 Personal history of malignant neoplasm of other organs and systems: Secondary | ICD-10-CM | POA: Diagnosis not present

## 2015-10-13 DIAGNOSIS — D509 Iron deficiency anemia, unspecified: Secondary | ICD-10-CM | POA: Insufficient documentation

## 2015-10-13 DIAGNOSIS — E119 Type 2 diabetes mellitus without complications: Secondary | ICD-10-CM | POA: Insufficient documentation

## 2015-10-13 DIAGNOSIS — Z79899 Other long term (current) drug therapy: Secondary | ICD-10-CM | POA: Diagnosis not present

## 2015-10-13 DIAGNOSIS — E785 Hyperlipidemia, unspecified: Secondary | ICD-10-CM | POA: Diagnosis not present

## 2015-10-13 DIAGNOSIS — I10 Essential (primary) hypertension: Secondary | ICD-10-CM | POA: Diagnosis not present

## 2015-10-13 LAB — CBC WITH DIFFERENTIAL/PLATELET
Basophils Absolute: 0 10*3/uL (ref 0–0.1)
Basophils Relative: 1 %
Eosinophils Absolute: 0.2 10*3/uL (ref 0–0.7)
Eosinophils Relative: 4 %
HEMATOCRIT: 36.4 % (ref 35.0–47.0)
Hemoglobin: 12 g/dL (ref 12.0–16.0)
LYMPHS ABS: 2.1 10*3/uL (ref 1.0–3.6)
MCH: 30.7 pg (ref 26.0–34.0)
MCHC: 32.9 g/dL (ref 32.0–36.0)
MCV: 93.4 fL (ref 80.0–100.0)
MONO ABS: 0.8 10*3/uL (ref 0.2–0.9)
NEUTROS ABS: 1.7 10*3/uL (ref 1.4–6.5)
Neutrophils Relative %: 35 %
Platelets: 216 10*3/uL (ref 150–440)
RBC: 3.9 MIL/uL (ref 3.80–5.20)
RDW: 13.7 % (ref 11.5–14.5)
WBC: 4.8 10*3/uL (ref 3.6–11.0)

## 2015-10-13 LAB — COMPREHENSIVE METABOLIC PANEL
ALT: 41 U/L (ref 14–54)
ANION GAP: 7 (ref 5–15)
AST: 49 U/L — ABNORMAL HIGH (ref 15–41)
Albumin: 3.9 g/dL (ref 3.5–5.0)
Alkaline Phosphatase: 65 U/L (ref 38–126)
BILIRUBIN TOTAL: 0.3 mg/dL (ref 0.3–1.2)
BUN: 24 mg/dL — ABNORMAL HIGH (ref 6–20)
CALCIUM: 9.5 mg/dL (ref 8.9–10.3)
CO2: 25 mmol/L (ref 22–32)
Chloride: 106 mmol/L (ref 101–111)
Creatinine, Ser: 1.04 mg/dL — ABNORMAL HIGH (ref 0.44–1.00)
GFR calc Af Amer: >60 mL/min (ref >60)
GFR, EST NON AFRICAN AMERICAN: 54 mL/min — AB (ref >60)
Glucose, Bld: 141 mg/dL — ABNORMAL HIGH (ref 65–99)
POTASSIUM: 4.4 mmol/L (ref 3.5–5.1)
Sodium: 138 mmol/L (ref 135–145)
TOTAL PROTEIN: 8.9 g/dL — AB (ref 6.5–8.1)

## 2015-10-13 LAB — IRON AND TIBC
Iron: 50 ug/dL (ref 28–170)
Saturation Ratios: 14 % (ref 10.4–31.8)
TIBC: 357 ug/dL (ref 250–450)
UIBC: 307 ug/dL

## 2015-10-13 LAB — FERRITIN: Ferritin: 259 ng/mL (ref 11–307)

## 2015-10-16 ENCOUNTER — Other Ambulatory Visit: Payer: Self-pay | Admitting: Internal Medicine

## 2015-10-16 ENCOUNTER — Other Ambulatory Visit: Payer: Medicare Other

## 2015-10-17 ENCOUNTER — Inpatient Hospital Stay (HOSPITAL_BASED_OUTPATIENT_CLINIC_OR_DEPARTMENT_OTHER): Payer: Medicare Other | Admitting: Internal Medicine

## 2015-10-17 ENCOUNTER — Inpatient Hospital Stay: Payer: Medicare Other

## 2015-10-17 VITALS — BP 136/77 | HR 80 | Temp 97.1°F | Wt 229.3 lb

## 2015-10-17 DIAGNOSIS — I10 Essential (primary) hypertension: Secondary | ICD-10-CM | POA: Diagnosis not present

## 2015-10-17 DIAGNOSIS — Z8589 Personal history of malignant neoplasm of other organs and systems: Secondary | ICD-10-CM

## 2015-10-17 DIAGNOSIS — E119 Type 2 diabetes mellitus without complications: Secondary | ICD-10-CM

## 2015-10-17 DIAGNOSIS — Z7984 Long term (current) use of oral hypoglycemic drugs: Secondary | ICD-10-CM | POA: Diagnosis not present

## 2015-10-17 DIAGNOSIS — Z79899 Other long term (current) drug therapy: Secondary | ICD-10-CM

## 2015-10-17 DIAGNOSIS — D6489 Other specified anemias: Secondary | ICD-10-CM

## 2015-10-17 DIAGNOSIS — E079 Disorder of thyroid, unspecified: Secondary | ICD-10-CM

## 2015-10-17 DIAGNOSIS — D509 Iron deficiency anemia, unspecified: Secondary | ICD-10-CM | POA: Diagnosis not present

## 2015-10-17 DIAGNOSIS — D649 Anemia, unspecified: Principal | ICD-10-CM

## 2015-10-17 DIAGNOSIS — E785 Hyperlipidemia, unspecified: Secondary | ICD-10-CM

## 2015-10-17 NOTE — Progress Notes (Signed)
No complaints

## 2015-10-17 NOTE — Progress Notes (Signed)
[facility] NOTE  Patient Care Team: Arlis Porta., MD as PCP - General (Family Medicine)  CHIEF COMPLAINTS/PURPOSE OF CONSULTATION:   # AUG 2016- IRON DEFICIENCY ANEMIA- Possible AVMs of colon [s/p Argon laser; Dr.Elliot;EGD-neg Nov 2016] s/p IV Ferriheme x2 [id number]; ALLERGY]; Venofer IV   # Rectal well diff carcinoid [1.2CM; s/p polypectomy 2011]; Allergy- [facility]ard Philomena Course 2016]- neck/face swelling [improved with benadryl]  HISTORY OF PRESENTING ILLNESS:  Diana Harmon 70 y.o.  female is here for follow-up for her iron deficiency anemia. Status post IV iron November 2016 significant improvement of energy levels.   Patient denies any shortness of breath or chest pain. Appetite is good. No weight loss. No blood in stools. Admits to black stools . No nausea no vomiting. She is taking 2 iron pills a day. Denies any constipation.  ROS: A complete 10 point review of system is done which is negative except mentioned above in history of present illness  MEDICAL HISTORY:  Past Medical History  Diagnosis Date  . Thyroid disease   . Benign tumor   . Diabetes mellitus without complication ([place])   . Nausea   . Anemia   . Patient is [name]   . Hypertension   . Hyperlipidemia     SURGICAL HISTORY: Past Surgical History  Procedure Laterality Date  . Tumor removed    . Thyroidectomy    . Colonoscopy    . Colonoscopy with propofol N/A 03/13/2015    Procedure: COLONOSCOPY WITH PROPOFOL;  Surgeon: Manya Silvas, MD;  Location: [facility] ENDOSCOPY;  Service: Endoscopy;  Laterality: N/A;  . Esophagogastroduodenoscopy N/A 03/13/2015    Procedure: ESOPHAGOGASTRODUODENOSCOPY (EGD);  Surgeon: Manya Silvas, MD;  Location: [facility] ENDOSCOPY;  Service: Endoscopy;  Laterality: N/A;    SOCIAL HISTORY: Social History   Social History  . Marital Status: Widowed    Spouse Name: N/A  . Number of Children: N/A  . Years of Education: N/A   Occupational  History  . Not on file.   Social History Main Topics  . Smoking status: Never Smoker   . Smokeless tobacco: Never Used  . Alcohol Use: 0.6 oz/week    1 Glasses of wine per week     Comment: occ. wine  . Drug Use: No  . Sexual Activity: Not on file   Other Topics Concern  . Not on file   Social History Narrative    FAMILY HISTORY: Family History  Problem Relation Age of Onset  . Parkinson's disease Mother   . Heart disease Father   . Lupus Sister   . Aneurysm Son   . Lupus Son     ALLERGIES:  is allergic to ferumoxytol; other; and penicillins.  MEDICATIONS:  Current Outpatient Prescriptions  Medication Sig Dispense Refill  . acetaminophen (TYLENOL) 500 MG tablet Take 1,000 mg by mouth every 6 (six) hours as needed for moderate pain.    . ferrous sulfate 325 (65 FE) MG tablet Take 1 tablet (325 mg total) by mouth 2 (two) times daily with a meal. 60 tablet 6  . glimepiride (AMARYL) 2 MG tablet Take 1 tablet (2 mg total) by mouth daily before breakfast. 90 tablet 3  . hydrochlorothiazide (HYDRODIURIL) 12.5 MG tablet Take 1 tablet (12.5 mg total) by mouth daily. 90 tablet 3  . lactobacillus acidophilus (BACID) TABS tablet Take 2 tablets by mouth 2 (two) times daily.    Marland Kitchen losartan (COZAAR) 50 MG tablet Take 1 tablet (50 mg total)  by mouth daily. 90 tablet 3  . metFORMIN (GLUCOPHAGE) 500 MG tablet Take 2 tablets (1,000 mg total) by mouth 2 (two) times daily with a meal. 360 tablet 1  . Omeprazole 20 MG TBEC Take 1 tablet (20 mg total) by mouth every morning. 90 each 3  . simvastatin (ZOCOR) 10 MG tablet Take 1 tablet (10 mg total) by mouth daily at 6 PM. 90 tablet 3   No current facility-administered medications for this visit.      Marland Kitchen  PHYSICAL EXAMINATION: ECOG PERFORMANCE STATUS: 1 - Symptomatic but completely ambulatory  Filed Vitals:   10/17/15 0935  BP: 136/77  Pulse: 80  Temp: 97.1 F (36.2 C)   Filed Weights   10/17/15 0935  Weight: 229 lb 4.5 oz (104 kg)     GENERAL: Well-nourished well-developed; Alert, no distress and comfortable. Walks with a cane. Obese. Currently in a wheel chair. EYES: no pallor or icterus OROPHARYNX: no thrush or ulceration; good dentition  NECK: supple, no masses felt LYMPH: no palpable lymphadenopathy in the cervical, axillary or inguinal regions LUNGS: clear to auscultation and No wheeze or crackles HEART/CVS: regular rate & rhythm and no murmurs; No lower extremity edema ABDOMEN: abdomen soft, non-tender and normal bowel sounds Musculoskeletal:no cyanosis of digits and no clubbing  PSYCH: alert & oriented x 3 with fluent speech NEURO: no focal motor/sensory deficits SKIN: no rashes or significant lesions LABORATORY DATA:  I have reviewed the data as listed Lab Results  Component Value Date   WBC 4.8 10/13/2015   HGB 12.0 10/13/2015   HCT 36.4 10/13/2015   MCV 93.4 10/13/2015   PLT 216 10/13/2015    Recent Labs  12/06/14 0846 02/28/15 1153 10/13/15 0923  NA  --  140 138  K  --  3.8 4.4  CL  --  105 106  CO2  --  24 25  GLUCOSE  --  105* 141*  BUN  --  20 24*  CREATININE  --  0.89 1.04*  CALCIUM  --  9.1 9.5  GFRNONAA  --  >60 54*  GFRAA  --  >60 >60  PROT 8.2 8.2* 8.9*  ALBUMIN 3.9 3.6 3.9  AST 64* 55* 49*  ALT 43* 30 41  ALKPHOS 64 55 65  BILITOT 0.3 0.5 0.3  BILIDIR 0.15  --   --     ASSESSMENT & PLAN:   # Iron deficiency anemia-most likely secondary to AVMs of the colon [colonoscopy November 2016]; patient received IV iron/Feraheme in early November 2016.  Patient has not needed any IV iron since then. Hemoglobin is 12.  Ferritin is 259.  Patient is taking  By mouth iron twice a day.  Patient tolerating it well.  Patient will not need IV iron now. Also add premedication.   #  Creatinine 1.04/ Baseline creatinine 1.89-  Reviewed the labs with the patient.  She was given a copy of the labs today.  Recommend increase by mouth intake.  #  Check H&H iron studies in 3 months  possible Venofer IV iron;  Follow-up with me in 6 months labs IV iron/ iron studies- few days prior.  #  Above plan was discussed with the patient and daughter in detail. They agree.    Cammie Sickle, MD 10/17/2015 9:44 AM

## 2015-10-26 ENCOUNTER — Encounter: Payer: Self-pay | Admitting: *Deleted

## 2015-10-26 ENCOUNTER — Ambulatory Visit (INDEPENDENT_AMBULATORY_CARE_PROVIDER_SITE_OTHER): Payer: Medicare Other | Admitting: Family Medicine

## 2015-10-26 ENCOUNTER — Encounter: Payer: Self-pay | Admitting: Family Medicine

## 2015-10-26 VITALS — BP 144/79 | HR 79 | Temp 98.8°F | Resp 16 | Ht 61.0 in | Wt 230.0 lb

## 2015-10-26 DIAGNOSIS — E7800 Pure hypercholesterolemia, unspecified: Secondary | ICD-10-CM | POA: Diagnosis not present

## 2015-10-26 DIAGNOSIS — I10 Essential (primary) hypertension: Secondary | ICD-10-CM | POA: Diagnosis not present

## 2015-10-26 DIAGNOSIS — N390 Urinary tract infection, site not specified: Secondary | ICD-10-CM | POA: Insufficient documentation

## 2015-10-26 DIAGNOSIS — J302 Other seasonal allergic rhinitis: Secondary | ICD-10-CM

## 2015-10-26 DIAGNOSIS — K219 Gastro-esophageal reflux disease without esophagitis: Secondary | ICD-10-CM

## 2015-10-26 DIAGNOSIS — Z87440 Personal history of urinary (tract) infections: Secondary | ICD-10-CM

## 2015-10-26 DIAGNOSIS — E119 Type 2 diabetes mellitus without complications: Secondary | ICD-10-CM | POA: Diagnosis not present

## 2015-10-26 LAB — POCT GLYCOSYLATED HEMOGLOBIN (HGB A1C): Hemoglobin A1C: 6.9

## 2015-10-26 MED ORDER — RANITIDINE HCL 75 MG PO TABS: 75.0000 mg | ORAL_TABLET | Freq: Two times a day (BID) | ORAL | Status: DC

## 2015-10-26 MED ORDER — LORATADINE 10 MG PO TABS: 10.0000 mg | ORAL_TABLET | Freq: Every day | ORAL | Status: DC

## 2015-10-26 MED ORDER — LOSARTAN POTASSIUM 100 MG PO TABS: 100.0000 mg | ORAL_TABLET | Freq: Every day | ORAL | Status: DC

## 2015-10-26 NOTE — Patient Instructions (Signed)
Note written requesting excusing from jury duty due to medical conditions (arthritis, HBP, Diabetes, obesity).

## 2015-10-26 NOTE — Addendum Note (Signed)
Addended by: Devona Konig on: 10/26/2015 09:37 AM   Modules accepted: Orders

## 2015-10-26 NOTE — Progress Notes (Signed)
Name: Diana Harmon   MRN: [id number]    DOB: Oct 12, 1945   Date:10/26/2015       Progress Note  Subjective  Chief Complaint  Chief Complaint  Patient presents with  . Diabetes  . Hypertension    HPI Here for f/u of DM, HBP.  She has developed some diarrhea again and has a hacky throaty cough.  She was treated for UTI by Urgent Care.  She feels that she has a few remaining sx.  No problem-specific assessment & plan notes found for this encounter.   Past Medical History  Diagnosis Date  . Thyroid disease   . Benign tumor   . Diabetes mellitus without complication ([place])   . Nausea   . Anemia   . Patient is [name]   . Hypertension   . Hyperlipidemia     Past Surgical History  Procedure Laterality Date  . Tumor removed    . Thyroidectomy    . Colonoscopy    . Colonoscopy with propofol N/A 03/13/2015    Procedure: COLONOSCOPY WITH PROPOFOL;  Surgeon: Manya Silvas, MD;  Location: [facility] ENDOSCOPY;  Service: Endoscopy;  Laterality: N/A;  . Esophagogastroduodenoscopy N/A 03/13/2015    Procedure: ESOPHAGOGASTRODUODENOSCOPY (EGD);  Surgeon: Manya Silvas, MD;  Location: [facility] ENDOSCOPY;  Service: Endoscopy;  Laterality: N/A;    Family History  Problem Relation Age of Onset  . Parkinson's disease Mother   . Heart disease Father   . Lupus Sister   . Aneurysm Son   . Lupus Son     Social History   Social History  . Marital Status: Widowed    Spouse Name: N/A  . Number of Children: N/A  . Years of Education: N/A   Occupational History  . Not on file.   Social History Main Topics  . Smoking status: Never Smoker   . Smokeless tobacco: Never Used  . Alcohol Use: 0.6 oz/week    1 Glasses of wine per week     Comment: occ. wine  . Drug Use: No  . Sexual Activity: Not on file   Other Topics Concern  . Not on file   Social History Narrative     Current outpatient prescriptions:  .  acetaminophen (TYLENOL) 500 MG tablet, Take 1,000 mg by  mouth every 6 (six) hours as needed for moderate pain., Disp: , Rfl:  .  ferrous sulfate 325 (65 FE) MG tablet, Take 1 tablet (325 mg total) by mouth 2 (two) times daily with a meal., Disp: 60 tablet, Rfl: 6 .  glimepiride (AMARYL) 2 MG tablet, Take 1 tablet (2 mg total) by mouth daily before breakfast., Disp: 90 tablet, Rfl: 3 .  hydrochlorothiazide (HYDRODIURIL) 12.5 MG tablet, Take 1 tablet (12.5 mg total) by mouth daily., Disp: 90 tablet, Rfl: 3 .  lactobacillus acidophilus (BACID) TABS tablet, Take 2 tablets by mouth 2 (two) times daily., Disp: , Rfl:  .  metFORMIN (GLUCOPHAGE) 500 MG tablet, Take 2 tablets (1,000 mg total) by mouth 2 (two) times daily with a meal., Disp: 360 tablet, Rfl: 1 .  simvastatin (ZOCOR) 10 MG tablet, Take 1 tablet (10 mg total) by mouth daily at 6 PM., Disp: 90 tablet, Rfl: 3 .  loratadine (CLARITIN) 10 MG tablet, Take 1 tablet (10 mg total) by mouth daily., Disp: 30 tablet, Rfl: 11 .  losartan (COZAAR) 100 MG tablet, Take 1 tablet (100 mg total) by mouth daily., Disp: 90 tablet, Rfl: 3 .  ranitidine (ZANTAC 75) 75  MG tablet, Take 1 tablet (75 mg total) by mouth 2 (two) times daily., Disp: 60 tablet, Rfl: 12  Allergies  Allergen Reactions  . Ferumoxytol Swelling    Swelling in neck  . Other     Other reaction(s): Other (See Comments) She refuses blood products-Jehovah witness  . Penicillins Rash, Swelling and Hives     Review of Systems  Constitutional: Negative for fever, chills, weight loss and malaise/fatigue.  HENT: Positive for congestion. Negative for hearing loss.   Eyes: Negative for blurred vision and double vision.  Respiratory: Positive for cough. Negative for shortness of breath and wheezing.   Cardiovascular: Negative for chest pain, palpitations and leg swelling.  Gastrointestinal: Positive for diarrhea. Negative for heartburn, abdominal pain and blood in stool.  Genitourinary: Negative for dysuria, urgency and frequency.  Musculoskeletal:  Negative for myalgias and joint pain.  Skin: Negative for rash.  Neurological: Negative for dizziness, tremors, weakness and headaches.      Objective  Filed Vitals:   10/26/15 0836  BP: 144/79  Pulse: 79  Temp: 98.8 F (37.1 C)  TempSrc: Oral  Resp: 16  Height: _0  (1.549 m)  Weight: 230 lb (104.327 kg)    Physical Exam  Constitutional: She is oriented to person, place, and time and well-developed, well-nourished, and in no distress. No distress.  HENT:  Head: Normocephalic and atraumatic.  Eyes: Conjunctivae and EOM are normal. Pupils are equal, round, and reactive to light. No scleral icterus.  Neck: Normal range of motion. Neck supple. Carotid bruit is present (faint bilateral bruits). No thyromegaly present.  Cardiovascular: Normal rate, regular rhythm and normal heart sounds.  Exam reveals no gallop and no friction rub.   No murmur heard. Pulmonary/Chest: Effort normal and breath sounds normal. No respiratory distress. She has no wheezes. She has no rales.  Abdominal: Soft. Bowel sounds are normal. She exhibits no distension and no mass. There is no tenderness.  Musculoskeletal: She exhibits edema (traced bilateral pedal edema).  Lymphadenopathy:    She has no cervical adenopathy.  Neurological: She is alert and oriented to person, place, and time.  Vitals reviewed.      Recent Results (from the past 2160 hour(s))  Urinalysis complete, with microscopic     Status: Abnormal   Collection Time: 10/10/15  2:57 PM  Result Value Ref Range   Color, Urine YELLOW YELLOW   APPearance HAZY (A) CLEAR   Glucose, UA NEGATIVE NEGATIVE mg/dL   Bilirubin Urine NEGATIVE NEGATIVE   Ketones, ur NEGATIVE NEGATIVE mg/dL   Specific Gravity, Urine 1.015 1.005 - 1.030   Hgb urine dipstick NEGATIVE NEGATIVE   pH 6.0 5.0 - 8.0   Protein, ur NEGATIVE NEGATIVE mg/dL   Nitrite NEGATIVE NEGATIVE   Leukocytes, UA NEGATIVE NEGATIVE   RBC / HPF 0-5 0 - 5 RBC/hpf   WBC, UA 6-30 0 - 5  WBC/hpf   Bacteria, UA MANY (A) NONE SEEN   Squamous Epithelial / LPF 6-30 (A) NONE SEEN   WBC Clumps PRESENT    Fat - U1-Fat PRESENT   CBC with Differential/Platelet     Status: None   Collection Time: 10/13/15  9:23 AM  Result Value Ref Range   WBC 4.8 3.6 - 11.0 K/uL   RBC 3.90 3.80 - 5.20 MIL/uL   Hemoglobin 12.0 12.0 - 16.0 g/dL   HCT 36.4 35.0 - 47.0 %   MCV 93.4 80.0 - 100.0 fL   MCH 30.7 26.0 - 34.0 pg   MCHC  32.9 32.0 - 36.0 g/dL   RDW 13.7 11.5 - 14.5 %   Platelets 216 150 - 440 K/uL   Neutrophils Relative % 35% %   Neutro Abs 1.7 1.4 - 6.5 K/uL   Lymphocytes Relative 43% %   Lymphs Abs 2.1 1.0 - 3.6 K/uL   Monocytes Relative 17% %   Monocytes Absolute 0.8 0.2 - 0.9 K/uL   Eosinophils Relative 4% %   Eosinophils Absolute 0.2 0 - 0.7 K/uL   Basophils Relative 1% %   Basophils Absolute 0.0 0 - 0.1 K/uL  Comprehensive metabolic panel     Status: Abnormal   Collection Time: 10/13/15  9:23 AM  Result Value Ref Range   Sodium 138 135 - 145 mmol/L   Potassium 4.4 3.5 - 5.1 mmol/L   Chloride 106 101 - 111 mmol/L   CO2 25 22 - 32 mmol/L   Glucose, Bld 141 (H) 65 - 99 mg/dL   BUN 24 (H) 6 - 20 mg/dL   Creatinine, Ser 1.04 (H) 0.44 - 1.00 mg/dL   Calcium 9.5 8.9 - 10.3 mg/dL   Total Protein 8.9 (H) 6.5 - 8.1 g/dL   Albumin 3.9 3.5 - 5.0 g/dL   AST 49 (H) 15 - 41 U/L   ALT 41 14 - 54 U/L   Alkaline Phosphatase 65 38 - 126 U/L   Total Bilirubin 0.3 0.3 - 1.2 mg/dL   GFR calc non Af Amer 54 (L) >60 mL/min   GFR calc Af Amer >60 >60 mL/min    Comment: (NOTE) The eGFR has been calculated using the CKD EPI equation. This calculation has not been validated in all clinical situations. eGFR's persistently <60 mL/min signify possible Chronic Kidney Disease.    Anion gap 7 5 - 15  Iron and TIBC     Status: None   Collection Time: 10/13/15  9:23 AM  Result Value Ref Range   Iron 50 28 - 170 ug/dL   TIBC 357 250 - 450 ug/dL   Saturation Ratios 14 10.4 - 31.8 %   UIBC 307  ug/dL  Ferritin     Status: None   Collection Time: 10/13/15  9:23 AM  Result Value Ref Range   Ferritin 259 11 - 307 ng/mL  POCT HgB A1C     Status: Abnormal   Collection Time: 10/26/15  8:53 AM  Result Value Ref Range   Hemoglobin A1C 6.9%      Assessment & Plan  Problem List Items Addressed This Visit      Cardiovascular and Mediastinum   Essential (primary) hypertension   Relevant Medications   losartan (COZAAR) 100 MG tablet     Digestive   Acid reflux   Relevant Medications   ranitidine (ZANTAC 75) 75 MG tablet     Endocrine   Diabetes mellitus, type 2 (HCC) - Primary   Relevant Medications   losartan (COZAAR) 100 MG tablet   Other Relevant Orders   POCT HgB A1C (Completed)     Other   Hypercholesteremia   Relevant Medications   losartan (COZAAR) 100 MG tablet   Extreme obesity (HCC)    Other Visit Diagnoses    Seasonal allergies        Relevant Medications    loratadine (CLARITIN) 10 MG tablet       Meds ordered this encounter  Medications  . ranitidine (ZANTAC 75) 75 MG tablet    Sig: Take 1 tablet (75 mg total) by mouth 2 (two) times daily.  Dispense:  60 tablet    Refill:  12  . losartan (COZAAR) 100 MG tablet    Sig: Take 1 tablet (100 mg total) by mouth daily.    Dispense:  90 tablet    Refill:  3  . loratadine (CLARITIN) 10 MG tablet    Sig: Take 1 tablet (10 mg total) by mouth daily.    Dispense:  30 tablet    Refill:  11   1. Type 2 diabetes mellitus without complication, without long-term current use of insulin (HCC)  - POCT HgB A1C-6.9 Cont meds 2. Essential (primary) hypertension  - losartan (COZAAR) 100 MG tablet; Take 1 tablet (100 mg total) by mouth daily.  Dispense: 90 tablet; Refill: 3  3. Gastroesophageal reflux disease without esophagitis Stop Omeprazole - ranitidine (ZANTAC 75) 75 MG tablet; Take 1 tablet (75 mg total) by mouth 2 (two) times daily.  Dispense: 60 tablet; Refill: 12  4. Hypercholesteremia Cont  med  5. Morbid obesity due to excess calories ([place]) Discussed weight loss with calorie reduction. 6. Seasonal allergies  - loratadine (CLARITIN) 10 MG tablet; Take 1 tablet (10 mg total) by mouth daily.  Dispense: 30 tablet; Refill: 11

## 2015-10-28 LAB — URINE CULTURE: Colony Count: 80000

## 2015-10-30 MED ORDER — NITROFURANTOIN MONOHYD MACRO 100 MG PO CAPS: 100.0000 mg | ORAL_CAPSULE | Freq: Two times a day (BID) | ORAL | Status: DC

## 2015-10-30 NOTE — Addendum Note (Signed)
Addended by: Devona Konig on: 10/30/2015 09:43 AM   Modules accepted: Orders

## 2015-12-22 DIAGNOSIS — E113393 Type 2 diabetes mellitus with moderate nonproliferative diabetic retinopathy without macular edema, bilateral: Secondary | ICD-10-CM | POA: Diagnosis not present

## 2016-01-01 ENCOUNTER — Other Ambulatory Visit: Payer: Self-pay | Admitting: Pharmacist

## 2016-01-01 NOTE — Patient Outreach (Signed)
Outreach call to [employer] regarding her request for follow up from the [facility] Medication Adherence Campaign. Called and spoke with patient. HIPAA identifiers verified and verbal consent received.  Ms. Raison reports that Dr. Luan Pulling recently increased her losartan dose to 100 mg daily. Reports that she took this dose using up her 50 mg tablets before starting the 100 mg tablets.  Reports taking her metformin 500 mg, 2 tablets twice daily as directed. Reports taking just half of the glimepiride 2 mg tablets once daily before breakfast. Reports that Dr. Luan Pulling instructed her to make this reduction at her last visit. States that she "does not check her blood sugar as often as she should". Reports that she last checked it about 2 weeks ago. Reports a bedtime reading of 141 mg/dL and that her last morning fasting reading was 137 mg/dL. Counsel patient about the importance of checking and recording her blood sugar and bringing this log to her medical appointments. Patient denies any recent low blood sugars. Reports that a couple of months ago her blood sugar went down to 85 mg/dL. Reports that she felt low and was sweating. Reports that this occurred because she had taken her medications, but not eaten for 5 hours. Reports that her daughter was with her and gave her a small glass of ginger ale and then made her a meal. Counsel patient on low blood sugar and management.  Patient asks about low blood sugar diets. Counsel patient about what are carbohydrates and sugars and what are good sources of protein. Discuss with patient some different meal options in order to find options that appeal to her. Patient expresses appreciation for having more information about diet.  Patient reports that she uses a weekly pillbox to organize her medications. Reports that she sometimes miss a dose of simvastatin. Reports that she has been taking it right at bedtime, which is usually around 10 pm. However, patient  reports that sometimes she gets in bed and falls asleep before taking it. Discuss with patient strategies for helping her to remember such as taking her medication at 8 pm instead and using a phone alarm. Patient states that she will give this a try.  Confirm patient's address and let her know that I will be mailing her some educational materials.  Patient states that she has no further medication questions or concerns. Reports that she has extra help for the cost of her medications. Provide patient with my phone number.  PLAN:  Will mail patient the following educational material: -EMMI: Diabetes Diet - Type 2  -EMMI: Diabetes - Low Blood Sugar - Self-care  -EMMI: Low-Salt Diet   Will close pharmacy episode at this time.  Harlow Asa, PharmD Clinical Pharmacist [facility] [place] [facility] [phone]

## 2016-01-16 ENCOUNTER — Inpatient Hospital Stay: Payer: Medicare Other | Attending: Internal Medicine

## 2016-01-16 DIAGNOSIS — D509 Iron deficiency anemia, unspecified: Secondary | ICD-10-CM | POA: Diagnosis not present

## 2016-01-16 DIAGNOSIS — D6489 Other specified anemias: Secondary | ICD-10-CM

## 2016-01-16 DIAGNOSIS — D649 Anemia, unspecified: Secondary | ICD-10-CM

## 2016-01-16 LAB — IRON AND TIBC
Iron: 57 ug/dL (ref 28–170)
SATURATION RATIOS: 18 % (ref 10.4–31.8)
TIBC: 321 ug/dL (ref 250–450)
UIBC: 264 ug/dL

## 2016-01-16 LAB — HEMOGLOBIN: HEMOGLOBIN: 11 g/dL — AB (ref 12.0–16.0)

## 2016-01-16 LAB — HEMATOCRIT: HEMATOCRIT: 34.5 % — AB (ref 35.0–47.0)

## 2016-01-16 LAB — FERRITIN: FERRITIN: 269 ng/mL (ref 11–307)

## 2016-01-23 ENCOUNTER — Inpatient Hospital Stay: Payer: Medicare Other

## 2016-01-30 ENCOUNTER — Ambulatory Visit (INDEPENDENT_AMBULATORY_CARE_PROVIDER_SITE_OTHER): Payer: Medicare Other | Admitting: Family Medicine

## 2016-01-30 ENCOUNTER — Encounter: Payer: Self-pay | Admitting: Family Medicine

## 2016-01-30 VITALS — BP 150/75 | HR 81 | Temp 98.2°F | Resp 16 | Ht 61.0 in | Wt 226.0 lb

## 2016-01-30 DIAGNOSIS — E7800 Pure hypercholesterolemia, unspecified: Secondary | ICD-10-CM

## 2016-01-30 DIAGNOSIS — D6489 Other specified anemias: Secondary | ICD-10-CM

## 2016-01-30 DIAGNOSIS — E1100 Type 2 diabetes mellitus with hyperosmolarity without nonketotic hyperglycemic-hyperosmolar coma (NKHHC): Secondary | ICD-10-CM

## 2016-01-30 DIAGNOSIS — J302 Other seasonal allergic rhinitis: Secondary | ICD-10-CM

## 2016-01-30 DIAGNOSIS — M150 Primary generalized (osteo)arthritis: Secondary | ICD-10-CM

## 2016-01-30 DIAGNOSIS — Z23 Encounter for immunization: Secondary | ICD-10-CM | POA: Diagnosis not present

## 2016-01-30 DIAGNOSIS — E119 Type 2 diabetes mellitus without complications: Secondary | ICD-10-CM | POA: Diagnosis not present

## 2016-01-30 DIAGNOSIS — D649 Anemia, unspecified: Secondary | ICD-10-CM | POA: Diagnosis not present

## 2016-01-30 DIAGNOSIS — I10 Essential (primary) hypertension: Secondary | ICD-10-CM

## 2016-01-30 DIAGNOSIS — M159 Polyosteoarthritis, unspecified: Secondary | ICD-10-CM

## 2016-01-30 LAB — POCT GLYCOSYLATED HEMOGLOBIN (HGB A1C): HEMOGLOBIN A1C: 6.5

## 2016-01-30 MED ORDER — HYDROCHLOROTHIAZIDE 12.5 MG PO TABS: ORAL_TABLET | ORAL | 3 refills | Status: DC

## 2016-01-30 MED ORDER — SIMVASTATIN 10 MG PO TABS: 10.0000 mg | ORAL_TABLET | Freq: Every day | ORAL | 3 refills | Status: DC

## 2016-01-30 MED ORDER — LOSARTAN POTASSIUM 100 MG PO TABS: 100.0000 mg | ORAL_TABLET | Freq: Every day | ORAL | 3 refills | Status: DC

## 2016-01-30 MED ORDER — LEVOCETIRIZINE DIHYDROCHLORIDE 5 MG PO TABS: 5.0000 mg | ORAL_TABLET | Freq: Every evening | ORAL | 12 refills | Status: DC

## 2016-01-30 NOTE — Progress Notes (Signed)
Name: Diana Harmon   MRN: [id number]    DOB: 08/14/1945   Date:01/30/2016       Progress Note  Subjective  Chief Complaint  Chief Complaint  Patient presents with  . Diabetes    HPI Here for f/u of DM and HBP.  Her BSs at home running 85-150.  Multiple fasting BSs of <100.  No sx of hypoglycemia reported by patient. She has a "sore place" in R axilla area. No skin discoloration.  No :"bump". No problem-specific Assessment & Plan notes found for this encounter.   Past Medical History:  Diagnosis Date  . Anemia   . Benign tumor   . Diabetes mellitus without complication ([place])   . Hyperlipidemia   . Hypertension   . Nausea   . Patient is [name]   . Thyroid disease     Past Surgical History:  Procedure Laterality Date  . COLONOSCOPY    . COLONOSCOPY WITH PROPOFOL N/A 03/13/2015   Procedure: COLONOSCOPY WITH PROPOFOL;  Surgeon: Manya Silvas, MD;  Location: [facility] ENDOSCOPY;  Service: Endoscopy;  Laterality: N/A;  . ESOPHAGOGASTRODUODENOSCOPY N/A 03/13/2015   Procedure: ESOPHAGOGASTRODUODENOSCOPY (EGD);  Surgeon: Manya Silvas, MD;  Location: [facility] ENDOSCOPY;  Service: Endoscopy;  Laterality: N/A;  . THYROIDECTOMY    . tumor removed      Family History  Problem Relation Age of Onset  . Parkinson's disease Mother   . Heart disease Father   . Lupus Sister   . Aneurysm Son   . Lupus Son     Social History   Social History  . Marital status: Widowed    Spouse name: N/A  . Number of children: N/A  . Years of education: N/A   Occupational History  . Not on file.   Social History Main Topics  . Smoking status: Never Smoker  . Smokeless tobacco: Never Used  . Alcohol use 0.6 oz/week    1 Glasses of wine per week     Comment: occ. wine  . Drug use: No  . Sexual activity: Not on file   Other Topics Concern  . Not on file   Social History Narrative  . No narrative on file     Current Outpatient Prescriptions:  .  acetaminophen  (TYLENOL) 500 MG tablet, Take 1,000 mg by mouth every 6 (six) hours as needed for moderate pain., Disp: , Rfl:  .  ferrous sulfate 325 (65 FE) MG tablet, Take 1 tablet (325 mg total) by mouth 2 (two) times daily with a meal., Disp: 60 tablet, Rfl: 6 .  hydrochlorothiazide (HYDRODIURIL) 12.5 MG tablet, Take 1 tablet each morning for BP and fluid., Disp: 90 tablet, Rfl: 3 .  lactobacillus acidophilus (BACID) TABS tablet, Take 2 tablets by mouth 2 (two) times daily., Disp: , Rfl:  .  losartan (COZAAR) 100 MG tablet, Take 1 tablet (100 mg total) by mouth daily., Disp: 90 tablet, Rfl: 3 .  metFORMIN (GLUCOPHAGE) 500 MG tablet, Take 2 tablets (1,000 mg total) by mouth 2 (two) times daily with a meal., Disp: 360 tablet, Rfl: 1 .  ranitidine (ZANTAC 75) 75 MG tablet, Take 1 tablet (75 mg total) by mouth 2 (two) times daily., Disp: 60 tablet, Rfl: 12 .  simvastatin (ZOCOR) 10 MG tablet, Take 1 tablet (10 mg total) by mouth daily at 6 PM., Disp: 90 tablet, Rfl: 3 .  levocetirizine (XYZAL) 5 MG tablet, Take 1 tablet (5 mg total) by mouth every evening., Disp: 30 tablet, Rfl:  12  Allergies  Allergen Reactions  . Ferumoxytol Swelling    Swelling in neck  . Other     Other reaction(s): Other (See Comments) She refuses blood products-Jehovah witness  . Penicillins Rash, Swelling and Hives     Review of Systems  Constitutional: Negative for chills, fever, malaise/fatigue and weight loss (desired some weight loss).  HENT: Negative for hearing loss.   Eyes: Negative for blurred vision and double vision.  Respiratory: Negative for cough, shortness of breath and wheezing.   Cardiovascular: Negative for chest pain, palpitations and leg swelling.  Gastrointestinal: Negative for abdominal pain, blood in stool and heartburn.  Genitourinary: Negative for dysuria, frequency and urgency.  Musculoskeletal: Negative for joint pain and myalgias.  Skin: Negative for rash.  Neurological: Negative for dizziness,  tremors, weakness and headaches.      Objective  Vitals:   01/30/16 0855 01/30/16 0952  BP: (!) 152/82 (!) 150/75  Pulse: 81   Resp: 16   Temp: 98.2 F (36.8 C)   TempSrc: Oral   Weight: 102.5 kg (226 lb)   Height: 5\' 1"  (1.549 m)     Physical Exam  Constitutional: She is oriented to person, place, and time and well-developed, well-nourished, and in no distress. No distress.  HENT:  Head: Normocephalic and atraumatic.  Eyes: Conjunctivae and EOM are normal. Pupils are equal, round, and reactive to light. No scleral icterus.  Neck: Normal range of motion. Neck supple. Carotid bruit is present (L carotid bruit). No thyromegaly present.  Cardiovascular: Normal rate, regular rhythm and normal heart sounds.  Exam reveals no gallop and no friction rub.   No murmur heard. Pulmonary/Chest: Effort normal and breath sounds normal. No respiratory distress. She has no wheezes. She has no rales.  Finger tip size area of tenderness iin R axilla.  No masses felt.  No skin changes.  Musculoskeletal: She exhibits edema (trace bilateral pedal edema.).  Lymphadenopathy:    She has no cervical adenopathy.  Neurological: She is alert and oriented to person, place, and time.  Vitals reviewed.      Recent Results (from the past 2160 hour(s))  Hematocrit [facility])     Status: Abnormal   Collection Time: 01/16/16  9:56 AM  Result Value Ref Range   HCT 34.5 (L) 35.0 - 47.0 %  Hemoglobin ([facility])     Status: Abnormal   Collection Time: 01/16/16  9:56 AM  Result Value Ref Range   Hemoglobin 11.0 (L) 12.0 - 16.0 g/dL  Ferritin     Status: None   Collection Time: 01/16/16  9:56 AM  Result Value Ref Range   Ferritin 269 11 - 307 ng/mL  Iron and TIBC     Status: None   Collection Time: 01/16/16  9:56 AM  Result Value Ref Range   Iron 57 28 - 170 ug/dL   TIBC 321 250 - 450 ug/dL   Saturation Ratios 18 10.4 - 31.8 %   UIBC 264 ug/dL     Assessment & Plan  Problem List Items Addressed This  Visit      Cardiovascular and Mediastinum   Essential (primary) hypertension   Relevant Medications   hydrochlorothiazide (HYDRODIURIL) 12.5 MG tablet   losartan (COZAAR) 100 MG tablet   simvastatin (ZOCOR) 10 MG tablet     Respiratory   Seasonal allergies   Relevant Medications   levocetirizine (XYZAL) 5 MG tablet     Endocrine   Diabetes mellitus, type 2 (HCC)   Relevant Medications  losartan (COZAAR) 100 MG tablet   simvastatin (ZOCOR) 10 MG tablet     Musculoskeletal and Integument   Arthritis, degenerative     Other   Hypercholesteremia   Relevant Medications   hydrochlorothiazide (HYDRODIURIL) 12.5 MG tablet   losartan (COZAAR) 100 MG tablet   simvastatin (ZOCOR) 10 MG tablet   Extreme obesity (HCC)   Anemia due to multiple mechanisms    Other Visit Diagnoses    Diabetes mellitus without complication ([place])    -  Primary   Relevant Medications   losartan (COZAAR) 100 MG tablet   simvastatin (ZOCOR) 10 MG tablet   Other Relevant Orders   POCT A1C   Immunization due       Relevant Orders   Flu vaccine HIGH DOSE PF (Fluzone High Dose)      Meds ordered this encounter  Medications  . DISCONTD: metFORMIN (GLUCOPHAGE) 1000 MG tablet    Sig: Take 1,000 mg by mouth 2 (two) times daily.  Marland Kitchen DISCONTD: amLODipine (NORVASC) 10 MG tablet    Sig: Take 10 mg by mouth daily.  . hydrochlorothiazide (HYDRODIURIL) 12.5 MG tablet    Sig: Take 1 tablet each morning for BP and fluid.    Dispense:  90 tablet    Refill:  3  . losartan (COZAAR) 100 MG tablet    Sig: Take 1 tablet (100 mg total) by mouth daily.    Dispense:  90 tablet    Refill:  3  . simvastatin (ZOCOR) 10 MG tablet    Sig: Take 1 tablet (10 mg total) by mouth daily at 6 PM.    Dispense:  90 tablet    Refill:  3  . levocetirizine (XYZAL) 5 MG tablet    Sig: Take 1 tablet (5 mg total) by mouth every evening.    Dispense:  30 tablet    Refill:  12   1. Diabetes mellitus without complication ([place])  -  POCT A1C  2. Essential (primary) hypertension  - hydrochlorothiazide (HYDRODIURIL) 12.5 MG tablet; Take 1 tablet each morning for BP and fluid.  Dispense: 90 tablet; Refill: 3 - losartan (COZAAR) 100 MG tablet; Take 1 tablet (100 mg total) by mouth daily.  Dispense: 90 tablet; Refill: 3  3. Type 2 diabetes mellitus with hyperosmolarity without coma, without long-term current use of insulin (HCC) Discontinue Glimeperide. Cont Metformin  4. Hypercholesteremia  - simvastatin (ZOCOR) 10 MG tablet; Take 1 tablet (10 mg total) by mouth daily at 6 PM.  Dispense: 90 tablet; Refill: 3  5. Morbid obesity due to excess calories ([place])   6. Anemia due to multiple mechanisms   7. Immunization due  - Flu vaccine HIGH DOSE PF (Fluzone High Dose)  8. Primary osteoarthritis involving multiple joints   9. Seasonal allergies  - levocetirizine (XYZAL) 5 MG tablet; Take 1 tablet (5 mg total) by mouth every evening.  Dispense: 30 tablet; Refill: 12

## 2016-02-20 ENCOUNTER — Other Ambulatory Visit: Payer: Self-pay | Admitting: Family Medicine

## 2016-02-20 ENCOUNTER — Telehealth: Payer: Self-pay | Admitting: Family Medicine

## 2016-02-20 DIAGNOSIS — E119 Type 2 diabetes mellitus without complications: Secondary | ICD-10-CM

## 2016-02-20 MED ORDER — METFORMIN HCL 500 MG PO TABS: 1000.0000 mg | ORAL_TABLET | Freq: Two times a day (BID) | ORAL | 1 refills | Status: DC

## 2016-02-20 NOTE — Telephone Encounter (Signed)
Pt advised and metformin were send.

## 2016-02-20 NOTE — Telephone Encounter (Signed)
Pt said her prescriptions for metformin and hydrochlorothiazide had expired at [facility].  Her call back number is [phone]

## 2016-02-20 NOTE — Telephone Encounter (Signed)
Send in a refill with 6 refills for these 2 drugs at her pharmacy.-jh

## 2016-02-21 ENCOUNTER — Telehealth: Payer: Self-pay | Admitting: Family Medicine

## 2016-02-21 NOTE — Telephone Encounter (Signed)
Pt called to get refill for HCTZ which was filled in 09/17 with 3 refill 90 day supply so suggested to call pharmacy but as per pt Dr. Luan Pulling told her to double her dose for HCTZ but I don't see any documentation so advised pt to continue same dosage of HCTZ 12.5 once daily we will call her if Dr. Luan Pulling wants her to change the dosage.

## 2016-02-22 NOTE — Telephone Encounter (Signed)
Yes, stay on the 12.5 mg/d  Of HCTZ and cont to take 1 daily for now.-jh

## 2016-02-22 NOTE — Telephone Encounter (Signed)
Patient aware.[place] 

## 2016-04-16 ENCOUNTER — Inpatient Hospital Stay: Payer: Medicare Other | Attending: Internal Medicine

## 2016-04-16 DIAGNOSIS — D6489 Other specified anemias: Secondary | ICD-10-CM

## 2016-04-16 DIAGNOSIS — Z79899 Other long term (current) drug therapy: Secondary | ICD-10-CM | POA: Insufficient documentation

## 2016-04-16 DIAGNOSIS — D509 Iron deficiency anemia, unspecified: Secondary | ICD-10-CM | POA: Insufficient documentation

## 2016-04-16 DIAGNOSIS — D649 Anemia, unspecified: Secondary | ICD-10-CM

## 2016-04-16 LAB — HEMOGLOBIN: Hemoglobin: 11 g/dL — ABNORMAL LOW (ref 12.0–16.0)

## 2016-04-16 LAB — IRON AND TIBC
IRON: 54 ug/dL (ref 28–170)
SATURATION RATIOS: 16 % (ref 10.4–31.8)
TIBC: 341 ug/dL (ref 250–450)
UIBC: 287 ug/dL

## 2016-04-16 LAB — FERRITIN: Ferritin: 227 ng/mL (ref 11–307)

## 2016-04-16 LAB — HEMATOCRIT: HCT: 34.3 % — ABNORMAL LOW (ref 35.0–47.0)

## 2016-04-19 ENCOUNTER — Other Ambulatory Visit: Payer: Self-pay | Admitting: *Deleted

## 2016-04-19 ENCOUNTER — Other Ambulatory Visit: Payer: Self-pay | Admitting: Internal Medicine

## 2016-04-19 DIAGNOSIS — D508 Other iron deficiency anemias: Secondary | ICD-10-CM

## 2016-04-23 ENCOUNTER — Ambulatory Visit: Payer: Medicare Other | Admitting: Internal Medicine

## 2016-04-23 ENCOUNTER — Inpatient Hospital Stay: Payer: Medicare Other

## 2016-04-23 VITALS — BP 132/77 | HR 79 | Temp 97.0°F | Resp 20

## 2016-04-23 DIAGNOSIS — Z79899 Other long term (current) drug therapy: Secondary | ICD-10-CM | POA: Diagnosis not present

## 2016-04-23 DIAGNOSIS — D508 Other iron deficiency anemias: Secondary | ICD-10-CM

## 2016-04-23 DIAGNOSIS — D509 Iron deficiency anemia, unspecified: Secondary | ICD-10-CM | POA: Diagnosis not present

## 2016-04-23 MED ORDER — SODIUM CHLORIDE 0.9 % IV SOLN: 4.0000 mg | Freq: Once | INTRAVENOUS | Status: DC

## 2016-04-23 MED ORDER — DIPHENHYDRAMINE HCL 50 MG/ML IJ SOLN
25.0000 mg | Freq: Once | INTRAMUSCULAR | Status: AC
  Administered 2016-04-23: 25 mg via INTRAVENOUS

## 2016-04-23 MED ORDER — SODIUM CHLORIDE 0.9 % IV SOLN
Freq: Once | INTRAVENOUS | Status: AC
  Administered 2016-04-23: 10:00:00 via INTRAVENOUS
  Filled 2016-04-23: qty 1000

## 2016-04-23 MED ORDER — IRON SUCROSE 20 MG/ML IV SOLN
200.0000 mg | Freq: Once | INTRAVENOUS | Status: AC
  Administered 2016-04-23: 200 mg via INTRAVENOUS

## 2016-04-23 MED ORDER — SODIUM CHLORIDE 0.9 % IV SOLN: 200.0000 mg | Freq: Once | INTRAVENOUS | Status: DC

## 2016-04-23 MED ORDER — DEXAMETHASONE SODIUM PHOSPHATE 10 MG/ML IJ SOLN
4.0000 mg | Freq: Once | INTRAMUSCULAR | Status: AC
  Administered 2016-04-23: 4 mg via INTRAVENOUS

## 2016-05-09 ENCOUNTER — Ambulatory Visit: Payer: Medicare Other | Admitting: Family Medicine

## 2016-05-14 ENCOUNTER — Ambulatory Visit: Payer: Medicare Other | Admitting: Family Medicine

## 2016-05-16 ENCOUNTER — Encounter: Payer: Self-pay | Admitting: Family Medicine

## 2016-05-16 ENCOUNTER — Ambulatory Visit (INDEPENDENT_AMBULATORY_CARE_PROVIDER_SITE_OTHER): Payer: Medicare Other | Admitting: Family Medicine

## 2016-05-16 VITALS — BP 135/76 | HR 80 | Temp 98.4°F | Resp 16 | Ht 61.0 in | Wt 217.0 lb

## 2016-05-16 DIAGNOSIS — E7800 Pure hypercholesterolemia, unspecified: Secondary | ICD-10-CM

## 2016-05-16 DIAGNOSIS — E1100 Type 2 diabetes mellitus with hyperosmolarity without nonketotic hyperglycemic-hyperosmolar coma (NKHHC): Secondary | ICD-10-CM | POA: Diagnosis not present

## 2016-05-16 DIAGNOSIS — D508 Other iron deficiency anemias: Secondary | ICD-10-CM

## 2016-05-16 DIAGNOSIS — K219 Gastro-esophageal reflux disease without esophagitis: Secondary | ICD-10-CM | POA: Diagnosis not present

## 2016-05-16 DIAGNOSIS — E119 Type 2 diabetes mellitus without complications: Secondary | ICD-10-CM

## 2016-05-16 DIAGNOSIS — I10 Essential (primary) hypertension: Secondary | ICD-10-CM | POA: Diagnosis not present

## 2016-05-16 LAB — POCT GLYCOSYLATED HEMOGLOBIN (HGB A1C): Hemoglobin A1C: 7.2

## 2016-05-16 MED ORDER — HYDROCHLOROTHIAZIDE 12.5 MG PO TABS: ORAL_TABLET | ORAL | 3 refills | Status: DC

## 2016-05-16 NOTE — Progress Notes (Signed)
Name: Diana Harmon   MRN: [id number]    DOB: 05-Jul-1945   Date:05/16/2016       Progress Note  Subjective  Chief Complaint  Chief Complaint  Patient presents with  . Diabetes  Here for f/u of DM and HBP.  She cont to lose weight.  Her A1c has risen from 6.5 to 7.2, but we had  Discontinued Amaryl 3-4 months ago.  Would prefer to watch A1c for time being.  She is taking all meds and feeling well ovedrall.  Diabetes  Pertinent negatives for hypoglycemia include no dizziness, headaches or tremors. Pertinent negatives for diabetes include no blurred vision, no chest pain, no weakness and no weight loss.     No problem-specific Assessment & Plan notes found for this encounter.   Past Medical History:  Diagnosis Date  . Anemia   . Benign tumor   . Diabetes mellitus without complication ([place])   . Hyperlipidemia   . Hypertension   . Nausea   . Patient is [name]   . Thyroid disease     Past Surgical History:  Procedure Laterality Date  . COLONOSCOPY    . COLONOSCOPY WITH PROPOFOL N/A 03/13/2015   Procedure: COLONOSCOPY WITH PROPOFOL;  Surgeon: Manya Silvas, MD;  Location: [facility] ENDOSCOPY;  Service: Endoscopy;  Laterality: N/A;  . ESOPHAGOGASTRODUODENOSCOPY N/A 03/13/2015   Procedure: ESOPHAGOGASTRODUODENOSCOPY (EGD);  Surgeon: Manya Silvas, MD;  Location: [facility] ENDOSCOPY;  Service: Endoscopy;  Laterality: N/A;  . THYROIDECTOMY    . tumor removed      Family History  Problem Relation Age of Onset  . Parkinson's disease Mother   . Heart disease Father   . Lupus Sister   . Aneurysm Son   . Lupus Son     Social History   Social History  . Marital status: Widowed    Spouse name: N/A  . Number of children: N/A  . Years of education: N/A   Occupational History  . Not on file.   Social History Main Topics  . Smoking status: Never Smoker  . Smokeless tobacco: Never Used  . Alcohol use 0.6 oz/week    1 Glasses of wine per week     Comment: occ.  wine  . Drug use: No  . Sexual activity: Not on file   Other Topics Concern  . Not on file   Social History Narrative  . No narrative on file     Current Outpatient Prescriptions:  .  acetaminophen (TYLENOL) 500 MG tablet, Take 1,000 mg by mouth every 6 (six) hours as needed for moderate pain., Disp: , Rfl:  .  ferrous sulfate 325 (65 FE) MG tablet, Take 1 tablet (325 mg total) by mouth 2 (two) times daily with a meal., Disp: 60 tablet, Rfl: 6 .  hydrochlorothiazide (HYDRODIURIL) 12.5 MG tablet, Take 1 tablet each morning for BP and fluid., Disp: 90 tablet, Rfl: 3 .  lactobacillus acidophilus (BACID) TABS tablet, Take 2 tablets by mouth 2 (two) times daily., Disp: , Rfl:  .  levocetirizine (XYZAL) 5 MG tablet, Take 1 tablet (5 mg total) by mouth every evening., Disp: 30 tablet, Rfl: 12 .  losartan (COZAAR) 100 MG tablet, Take 1 tablet (100 mg total) by mouth daily., Disp: 90 tablet, Rfl: 3 .  metFORMIN (GLUCOPHAGE) 500 MG tablet, Take 2 tablets (1,000 mg total) by mouth 2 (two) times daily with a meal., Disp: 360 tablet, Rfl: 1 .  ranitidine (ZANTAC 75) 75 MG tablet, Take 1  tablet (75 mg total) by mouth 2 (two) times daily., Disp: 60 tablet, Rfl: 12 .  simvastatin (ZOCOR) 10 MG tablet, Take 1 tablet (10 mg total) by mouth daily at 6 PM., Disp: 90 tablet, Rfl: 3  Allergies  Allergen Reactions  . Ferumoxytol Swelling    Swelling in neck  . Other     Other reaction(s): Other (See Comments) She refuses blood products-Jehovah witness  . Penicillins Rash, Swelling and Hives     Review of Systems  Constitutional: Negative for chills, fever, malaise/fatigue and weight loss.  HENT: Negative for hearing loss and tinnitus.   Eyes: Negative for blurred vision and double vision.  Respiratory: Negative for cough, hemoptysis, shortness of breath and wheezing.   Cardiovascular: Negative for chest pain, palpitations and leg swelling.  Gastrointestinal: Negative for abdominal pain, blood in  stool and heartburn.  Genitourinary: Negative for dysuria, frequency and urgency.  Musculoskeletal: Positive for joint pain. Negative for neck pain.  Skin: Negative for rash.  Neurological: Negative for dizziness, tingling, tremors, weakness and headaches.      Objective  Vitals:   05/16/16 1047  BP: 135/76  Pulse: 80  Resp: 16  Temp: 98.4 F (36.9 C)  TempSrc: Oral  Weight: 217 lb (98.4 kg)  Height: 5\' 1"  (1.549 m)    Physical Exam  Constitutional: She is oriented to person, place, and time and well-developed, well-nourished, and in no distress. No distress.  HENT:  Head: Normocephalic and atraumatic.  Eyes: Conjunctivae and EOM are normal. Pupils are equal, round, and reactive to light. No scleral icterus.  Neck: Normal range of motion. Carotid bruit is not present. No thyromegaly present.  Cardiovascular: Normal rate and regular rhythm.  Exam reveals no gallop and no friction rub.   Murmur heard.  Systolic murmur is present with a grade of 3/6  throughout  Pulmonary/Chest: Effort normal and breath sounds normal. No respiratory distress. She has no wheezes. She has no rales.  Abdominal: Soft. Bowel sounds are normal. She exhibits no distension and no mass. There is no tenderness.  Musculoskeletal: She exhibits edema (trace bilateral pedal edema.).  Lymphadenopathy:    She has no cervical adenopathy.  Neurological: She is alert and oriented to person, place, and time.  Vitals reviewed.      Recent Results (from the past 2160 hour(s))  Hematocrit [facility])     Status: Abnormal   Collection Time: 04/16/16 10:01 AM  Result Value Ref Range   HCT 34.3 (L) 35.0 - 47.0 %  Hemoglobin ([facility])     Status: Abnormal   Collection Time: 04/16/16 10:01 AM  Result Value Ref Range   Hemoglobin 11.0 (L) 12.0 - 16.0 g/dL  Ferritin     Status: None   Collection Time: 04/16/16 10:01 AM  Result Value Ref Range   Ferritin 227 11 - 307 ng/mL  Iron and TIBC     Status: None    Collection Time: 04/16/16 10:01 AM  Result Value Ref Range   Iron 54 28 - 170 ug/dL   TIBC 341 250 - 450 ug/dL   Saturation Ratios 16 10.4 - 31.8 %   UIBC 287 ug/dL  POCT HgB A1C     Status: Abnormal   Collection Time: 05/16/16 11:24 AM  Result Value Ref Range   Hemoglobin A1C 7.2      Assessment & Plan  Problem List Items Addressed This Visit      Cardiovascular and Mediastinum   Essential (primary) hypertension   Relevant Medications  hydrochlorothiazide (HYDRODIURIL) 12.5 MG tablet     Digestive   Acid reflux     Endocrine   Diabetes mellitus, type 2 (HCC)     Other   Hypercholesteremia   Relevant Medications   hydrochlorothiazide (HYDRODIURIL) 12.5 MG tablet   Other Relevant Orders   Lipid Profile   Other iron deficiency anemia    Other Visit Diagnoses    Diabetes mellitus without complication ([place])    -  Primary   Relevant Orders   POCT HgB A1C (Completed)   COMPLETE METABOLIC PANEL WITH GFR      Meds ordered this encounter  Medications  . hydrochlorothiazide (HYDRODIURIL) 12.5 MG tablet    Sig: Take 1 tablet each morning for BP and fluid.    Dispense:  90 tablet    Refill:  3   1. Diabetes mellitus without complication ([place])  - POCT HgB A1C-7.2 - COMPLETE METABOLIC PANEL WITH GFR  2. Essential (primary) hypertension Cont otyers - hydrochlorothiazide (HYDRODIURIL) 12.5 MG tablet; Take 1 tablet each morning for BP and fluid.  Dispense: 90 tablet; Refill: 3  3. Type 2 diabetes mellitus with hyperosmolarity without coma, without long-term current use of insulin ([place]) c ont Metfrmin  4. Other iron deficiency anemia cont iron  5. Hypercholesteremia Cont meds - Lipid Profile  6. Gastroesophageal reflux disease without esophagitis Cont meds

## 2016-05-22 ENCOUNTER — Other Ambulatory Visit: Payer: Medicare Other

## 2016-06-03 ENCOUNTER — Other Ambulatory Visit: Payer: Medicare Other

## 2016-06-03 ENCOUNTER — Other Ambulatory Visit: Payer: Self-pay | Admitting: Family Medicine

## 2016-06-03 DIAGNOSIS — E119 Type 2 diabetes mellitus without complications: Secondary | ICD-10-CM | POA: Diagnosis not present

## 2016-06-03 DIAGNOSIS — E7800 Pure hypercholesterolemia, unspecified: Secondary | ICD-10-CM | POA: Diagnosis not present

## 2016-06-03 LAB — LIPID PANEL
CHOLESTEROL: 187 mg/dL (ref <200)
HDL: 56 mg/dL (ref >50)
LDL Cholesterol: 105 mg/dL — ABNORMAL HIGH (ref <100)
Total CHOL/HDL Ratio: 3.3 Ratio (ref <5.0)
Triglycerides: 129 mg/dL (ref <150)
VLDL: 26 mg/dL (ref <30)

## 2016-06-03 LAB — COMPLETE METABOLIC PANEL WITH GFR
ALBUMIN: 3.5 g/dL — AB (ref 3.6–5.1)
ALK PHOS: 52 U/L (ref 33–130)
ALT: 22 U/L (ref 6–29)
AST: 32 U/L (ref 10–35)
BILIRUBIN TOTAL: 0.5 mg/dL (ref 0.2–1.2)
BUN: 17 mg/dL (ref 7–25)
CALCIUM: 9.2 mg/dL (ref 8.6–10.4)
CO2: 27 mmol/L (ref 20–31)
CREATININE: 0.84 mg/dL (ref 0.50–0.99)
Chloride: 104 mmol/L (ref 98–110)
GFR, Est African American: 82 mL/min (ref >=60)
GFR, Est Non African American: 71 mL/min (ref >=60)
Glucose, Bld: 128 mg/dL — ABNORMAL HIGH (ref 65–99)
POTASSIUM: 4.1 mmol/L (ref 3.5–5.3)
Sodium: 140 mmol/L (ref 135–146)
TOTAL PROTEIN: 7.5 g/dL (ref 6.1–8.1)

## 2016-06-04 ENCOUNTER — Other Ambulatory Visit: Payer: Self-pay | Admitting: *Deleted

## 2016-06-04 DIAGNOSIS — E7800 Pure hypercholesterolemia, unspecified: Secondary | ICD-10-CM

## 2016-06-04 MED ORDER — SIMVASTATIN 20 MG PO TABS
20.0000 mg | ORAL_TABLET | Freq: Every day | ORAL | 6 refills | Status: DC
Start: 2016-06-04 — End: 2017-01-27

## 2016-06-17 DIAGNOSIS — H25813 Combined forms of age-related cataract, bilateral: Secondary | ICD-10-CM | POA: Diagnosis not present

## 2016-07-04 ENCOUNTER — Ambulatory Visit (INDEPENDENT_AMBULATORY_CARE_PROVIDER_SITE_OTHER): Payer: Medicare Other | Admitting: Family Medicine

## 2016-07-04 ENCOUNTER — Encounter: Payer: Self-pay | Admitting: Family Medicine

## 2016-07-04 VITALS — BP 139/62 | HR 80 | Temp 98.4°F | Resp 16 | Ht 61.0 in | Wt 214.0 lb

## 2016-07-04 DIAGNOSIS — G459 Transient cerebral ischemic attack, unspecified: Secondary | ICD-10-CM | POA: Diagnosis not present

## 2016-07-04 DIAGNOSIS — R011 Cardiac murmur, unspecified: Secondary | ICD-10-CM | POA: Diagnosis not present

## 2016-07-04 DIAGNOSIS — E1100 Type 2 diabetes mellitus with hyperosmolarity without nonketotic hyperglycemic-hyperosmolar coma (NKHHC): Secondary | ICD-10-CM | POA: Diagnosis not present

## 2016-07-04 DIAGNOSIS — R55 Syncope and collapse: Secondary | ICD-10-CM

## 2016-07-04 DIAGNOSIS — I350 Nonrheumatic aortic (valve) stenosis: Secondary | ICD-10-CM | POA: Insufficient documentation

## 2016-07-04 DIAGNOSIS — I10 Essential (primary) hypertension: Secondary | ICD-10-CM | POA: Diagnosis not present

## 2016-07-04 DIAGNOSIS — H8101 Meniere's disease, right ear: Secondary | ICD-10-CM | POA: Insufficient documentation

## 2016-07-04 LAB — CBC WITH DIFFERENTIAL/PLATELET
BASOS PCT: 0 %
Basophils Absolute: 0 cells/uL (ref 0–200)
EOS ABS: 102 {cells}/uL (ref 15–500)
Eosinophils Relative: 3 %
HCT: 36.2 % (ref 35.0–45.0)
Hemoglobin: 11.6 g/dL — ABNORMAL LOW (ref 11.7–15.5)
LYMPHS PCT: 45 %
Lymphs Abs: 1530 cells/uL (ref 850–3900)
MCH: 29.9 pg (ref 27.0–33.0)
MCHC: 32 g/dL (ref 32.0–36.0)
MCV: 93.3 fL (ref 80.0–100.0)
MONOS PCT: 13 %
MPV: 10 fL (ref 7.5–12.5)
Monocytes Absolute: 442 cells/uL (ref 200–950)
NEUTROS ABS: 1326 {cells}/uL — AB (ref 1500–7800)
Neutrophils Relative %: 39 %
PLATELETS: 238 10*3/uL (ref 140–400)
RBC: 3.88 MIL/uL (ref 3.80–5.10)
RDW: 13.7 % (ref 11.0–15.0)
WBC: 3.4 10*3/uL — AB (ref 3.8–10.8)

## 2016-07-04 LAB — COMPLETE METABOLIC PANEL WITH GFR
ALT: 29 U/L (ref 6–29)
AST: 50 U/L — ABNORMAL HIGH (ref 10–35)
Albumin: 3.6 g/dL (ref 3.6–5.1)
Alkaline Phosphatase: 52 U/L (ref 33–130)
BILIRUBIN TOTAL: 0.6 mg/dL (ref 0.2–1.2)
BUN: 16 mg/dL (ref 7–25)
CALCIUM: 9.1 mg/dL (ref 8.6–10.4)
CHLORIDE: 105 mmol/L (ref 98–110)
CO2: 25 mmol/L (ref 20–31)
CREATININE: 0.93 mg/dL (ref 0.60–0.93)
GFR, EST AFRICAN AMERICAN: 72 mL/min (ref >=60)
GFR, EST NON AFRICAN AMERICAN: 62 mL/min (ref >=60)
Glucose, Bld: 125 mg/dL — ABNORMAL HIGH (ref 65–99)
Potassium: 3.9 mmol/L (ref 3.5–5.3)
Sodium: 141 mmol/L (ref 135–146)
TOTAL PROTEIN: 7.5 g/dL (ref 6.1–8.1)

## 2016-07-04 MED ORDER — ASPIRIN EC 81 MG PO TBEC: 81.0000 mg | DELAYED_RELEASE_TABLET | Freq: Every day | ORAL | 12 refills | Status: DC

## 2016-07-04 NOTE — Progress Notes (Signed)
Name: Diana Harmon   MRN: [id number]    DOB: 1945-09-03   Date:07/04/2016       Progress Note  Subjective  Chief Complaint  Chief Complaint  Patient presents with  . Diabetes  . Near Syncope    HPI Here because of episode  5 days ago where she felt dizzy, felt stomach discomfort, sweating, felt that she would fall, but she sat into chair.  Could not get up from chair for about 10 min.   She never lost consciousness.Eventually slowly resolved over 30 min.  She felt weak.  She then went to bed but still felt dizzy.  Rested in bed.  Felt tired for thew next 24 hrs.  The next day she could see flashing colored lights when she looked tgo the L.  Lasted part of day.   Now resolved.   She still feels tired and stumbles somewhat when she walks now Her B Ss at home is running 115-155. Also c/o L foot "peels" all the time. No problem-specific Assessment & Plan notes found for this encounter.   Past Medical History:  Diagnosis Date  . Anemia   . Benign tumor   . Diabetes mellitus without complication ([place])   . Hyperlipidemia   . Hypertension   . Nausea   . Patient is [name]   . Thyroid disease     Past Surgical History:  Procedure Laterality Date  . COLONOSCOPY    . COLONOSCOPY WITH PROPOFOL N/A 03/13/2015   Procedure: COLONOSCOPY WITH PROPOFOL;  Surgeon: Manya Silvas, MD;  Location: [facility] ENDOSCOPY;  Service: Endoscopy;  Laterality: N/A;  . ESOPHAGOGASTRODUODENOSCOPY N/A 03/13/2015   Procedure: ESOPHAGOGASTRODUODENOSCOPY (EGD);  Surgeon: Manya Silvas, MD;  Location: [facility] ENDOSCOPY;  Service: Endoscopy;  Laterality: N/A;  . THYROIDECTOMY    . tumor removed      Family History  Problem Relation Age of Onset  . Parkinson's disease Mother   . Heart disease Father   . Lupus Sister   . Aneurysm Son   . Lupus Son     Social History   Social History  . Marital status: Widowed    Spouse name: N/A  . Number of children: N/A  . Years of education: N/A    Occupational History  . Not on file.   Social History Main Topics  . Smoking status: Never Smoker  . Smokeless tobacco: Never Used  . Alcohol use 0.6 oz/week    1 Glasses of wine per week     Comment: occ. wine  . Drug use: No  . Sexual activity: Not on file   Other Topics Concern  . Not on file   Social History Narrative  . No narrative on file     Current Outpatient Prescriptions:  .  acetaminophen (TYLENOL) 500 MG tablet, Take 1,000 mg by mouth every 6 (six) hours as needed for moderate pain., Disp: , Rfl:  .  ferrous sulfate 325 (65 FE) MG tablet, Take 1 tablet (325 mg total) by mouth 2 (two) times daily with a meal., Disp: 60 tablet, Rfl: 6 .  hydrochlorothiazide (HYDRODIURIL) 12.5 MG tablet, Take 1 tablet each morning for BP and fluid., Disp: 90 tablet, Rfl: 3 .  lactobacillus acidophilus (BACID) TABS tablet, Take 2 tablets by mouth 2 (two) times daily., Disp: , Rfl:  .  levocetirizine (XYZAL) 5 MG tablet, Take 1 tablet (5 mg total) by mouth every evening., Disp: 30 tablet, Rfl: 12 .  losartan (COZAAR) 100 MG tablet,  Take 1 tablet (100 mg total) by mouth daily., Disp: 90 tablet, Rfl: 3 .  metFORMIN (GLUCOPHAGE) 500 MG tablet, Take 2 tablets (1,000 mg total) by mouth 2 (two) times daily with a meal., Disp: 360 tablet, Rfl: 1 .  ranitidine (ZANTAC 75) 75 MG tablet, Take 1 tablet (75 mg total) by mouth 2 (two) times daily., Disp: 60 tablet, Rfl: 12 .  simvastatin (ZOCOR) 20 MG tablet, Take 1 tablet (20 mg total) by mouth daily at 6 PM., Disp: 30 tablet, Rfl: 6 .  aspirin EC 81 MG tablet, Take 1 tablet (81 mg total) by mouth daily., Disp: 100 tablet, Rfl: 12  Allergies  Allergen Reactions  . Ferumoxytol Swelling    Swelling in neck  . Other     Other reaction(s): Other (See Comments) She refuses blood products-Jehovah witness  . Penicillins Rash, Swelling and Hives     Review of Systems  Constitutional: Positive for malaise/fatigue. Negative for chills, fever and  weight loss.  HENT: Negative for hearing loss and tinnitus.   Eyes: Negative for blurred vision and double vision.  Respiratory: Negative for cough, shortness of breath and wheezing.   Cardiovascular: Negative for chest pain, palpitations and leg swelling.  Gastrointestinal: Negative for abdominal pain, blood in stool, constipation, diarrhea, heartburn and nausea.  Genitourinary: Negative for dysuria, frequency and urgency.  Musculoskeletal: Negative for joint pain and myalgias.  Skin: Negative for rash.  Neurological: Positive for dizziness and weakness. Negative for tingling, tremors and headaches.      Objective  Vitals:   07/04/16 0952  BP: 139/62  Pulse: 80  Resp: 16  Temp: 98.4 F (36.9 C)  TempSrc: Oral  Weight: 214 lb (97.1 kg)  Height: 5\' 1"  (1.549 m)    Physical Exam  Constitutional: She is oriented to person, place, and time and well-developed, well-nourished, and in no distress. No distress.  HENT:  Head: Normocephalic and atraumatic.  Eyes: Conjunctivae and EOM are normal. Pupils are equal, round, and reactive to light. No scleral icterus.  Neck: Normal range of motion. Neck supple. Carotid bruit is not present. No thyromegaly present.  Cardiovascular: Normal rate and regular rhythm.  Exam reveals no gallop and no friction rub.   Murmur heard.  Systolic murmur is present with a grade of 3/6  throughout  Pulmonary/Chest: Effort normal and breath sounds normal. No respiratory distress. She has no wheezes. She has no rales.  Abdominal: Soft. She exhibits no distension. There is no tenderness.  Musculoskeletal: She exhibits no edema.  Lymphadenopathy:    She has no cervical adenopathy.  Neurological: She is alert and oriented to person, place, and time. She displays normal reflexes. No cranial nerve deficit. Coordination normal.   Walks with cane but gait is normal  Psychiatric: Mood, memory, affect and judgment normal.  Vitals reviewed.      Recent  Results (from the past 2160 hour(s))  Hematocrit [facility])     Status: Abnormal   Collection Time: 04/16/16 10:01 AM  Result Value Ref Range   HCT 34.3 (L) 35.0 - 47.0 %  Hemoglobin ([facility])     Status: Abnormal   Collection Time: 04/16/16 10:01 AM  Result Value Ref Range   Hemoglobin 11.0 (L) 12.0 - 16.0 g/dL  Ferritin     Status: None   Collection Time: 04/16/16 10:01 AM  Result Value Ref Range   Ferritin 227 11 - 307 ng/mL  Iron and TIBC     Status: None   Collection Time: 04/16/16  10:01 AM  Result Value Ref Range   Iron 54 28 - 170 ug/dL   TIBC 341 250 - 450 ug/dL   Saturation Ratios 16 10.4 - 31.8 %   UIBC 287 ug/dL  POCT HgB A1C     Status: Abnormal   Collection Time: 05/16/16 11:24 AM  Result Value Ref Range   Hemoglobin A1C 7.2   COMPLETE METABOLIC PANEL WITH GFR     Status: Abnormal   Collection Time: 06/03/16 12:01 AM  Result Value Ref Range   Sodium 140 135 - 146 mmol/L   Potassium 4.1 3.5 - 5.3 mmol/L   Chloride 104 98 - 110 mmol/L   CO2 27 20 - 31 mmol/L   Glucose, Bld 128 (H) 65 - 99 mg/dL   BUN 17 7 - 25 mg/dL   Creat 0.84 0.50 - 0.99 mg/dL    Comment:   For patients > or = 70 years of age: The upper reference limit for Creatinine is approximately 13% higher for people identified as African-American.      Total Bilirubin 0.5 0.2 - 1.2 mg/dL   Alkaline Phosphatase 52 33 - 130 U/L   AST 32 10 - 35 U/L   ALT 22 6 - 29 U/L   Total Protein 7.5 6.1 - 8.1 g/dL   Albumin 3.5 (L) 3.6 - 5.1 g/dL   Calcium 9.2 8.6 - 10.4 mg/dL   GFR, Est African American 82 >=60 mL/min   GFR, Est Non African American 71 >=60 mL/min  Lipid panel     Status: Abnormal   Collection Time: 06/03/16 12:01 AM  Result Value Ref Range   Cholesterol 187 <200 mg/dL   Triglycerides 129 <150 mg/dL   HDL 56 >50 mg/dL   Total CHOL/HDL Ratio 3.3 <5.0 Ratio   VLDL 26 <30 mg/dL   LDL Cholesterol 105 (H) <100 mg/dL     Assessment & Plan  Problem List Items Addressed This Visit       Cardiovascular and Mediastinum   Essential (primary) hypertension - Primary   Relevant Medications   aspirin EC 81 MG tablet   Near syncope   Relevant Medications   aspirin EC 81 MG tablet   Other Relevant Orders   COMPLETE METABOLIC PANEL WITH GFR   CBC with Differential   TSH   TIA (transient ischemic attack)   Relevant Medications   aspirin EC 81 MG tablet   Other Relevant Orders   Ambulatory referral to Neurology     Endocrine   Diabetes mellitus, type 2 (HCC)   Relevant Medications   aspirin EC 81 MG tablet     Other   Cardiac murmur   Relevant Orders   Ambulatory referral to Cardiology      Meds ordered this encounter  Medications  . aspirin EC 81 MG tablet    Sig: Take 1 tablet (81 mg total) by mouth daily.    Dispense:  100 tablet    Refill:  12   1. Near syncope  - COMPLETE METABOLIC PANEL WITH GFR - CBC with Differential - TSH - aspirin EC 81 MG tablet; Take 1 tablet (81 mg total) by mouth daily.  Dispense: 100 tablet; Refill: 12  2. Essential (primary) hypertension   3. Type 2 diabetes mellitus with hyperosmolarity without coma, without long-term current use of insulin ([place])   4. Transient cerebral ischemia, unspecified type  - Ambulatory referral to Neurology  5. Cardiac murmur  - Ambulatory referral to Cardiology  Cont all current  meds

## 2016-07-05 LAB — TSH: TSH: 2.11 m[IU]/L

## 2016-07-10 ENCOUNTER — Ambulatory Visit (INDEPENDENT_AMBULATORY_CARE_PROVIDER_SITE_OTHER): Payer: Medicare Other | Admitting: Cardiology

## 2016-07-10 ENCOUNTER — Encounter: Payer: Self-pay | Admitting: Cardiology

## 2016-07-10 VITALS — BP 110/58 | HR 80 | Ht 61.0 in | Wt 216.0 lb

## 2016-07-10 DIAGNOSIS — R42 Dizziness and giddiness: Secondary | ICD-10-CM

## 2016-07-10 DIAGNOSIS — R011 Cardiac murmur, unspecified: Secondary | ICD-10-CM

## 2016-07-10 DIAGNOSIS — I10 Essential (primary) hypertension: Secondary | ICD-10-CM

## 2016-07-10 DIAGNOSIS — I4510 Unspecified right bundle-branch block: Secondary | ICD-10-CM | POA: Diagnosis not present

## 2016-07-10 DIAGNOSIS — R0602 Shortness of breath: Secondary | ICD-10-CM | POA: Diagnosis not present

## 2016-07-10 DIAGNOSIS — R0789 Other chest pain: Secondary | ICD-10-CM | POA: Diagnosis not present

## 2016-07-10 DIAGNOSIS — E784 Other hyperlipidemia: Secondary | ICD-10-CM | POA: Diagnosis not present

## 2016-07-10 DIAGNOSIS — E7849 Other hyperlipidemia: Secondary | ICD-10-CM

## 2016-07-10 NOTE — Patient Instructions (Addendum)
Medication Instructions:  Your physician has recommended you make the following change in your medication:  1. Talk with your physician about discontinuing the hydrochlorothiazide    Testing/Procedures: Your physician has requested that you have an echocardiogram. Echocardiography is a painless test that uses sound waves to create images of your heart. It provides your doctor with information about the size and shape of your heart and how well your heart's chambers and valves are working. This procedure takes approximately one hour. There are no restrictions for this procedure.  Diana Harmon  Your caregiver has ordered a Stress Test with nuclear imaging. The purpose of this test is to evaluate the blood supply to your heart muscle. This procedure is referred to as a "Non-Invasive Stress Test." This is because other than having an IV started in your vein, nothing is inserted or "invades" your body. Cardiac stress tests are done to find areas of poor blood flow to the heart by determining the extent of coronary artery disease (CAD). Some patients exercise on a treadmill, which naturally increases the blood flow to your heart, while others who are  unable to walk on a treadmill due to physical limitations have a pharmacologic/chemical stress agent called Lexiscan . This medicine will mimic walking on a treadmill by temporarily increasing your coronary blood flow.   Please note: these test may take anywhere between 2-4 hours to complete  PLEASE REPORT TO [facility] AT THE FIRST DESK WILL DIRECT YOU WHERE TO GO  Date of Procedure:__Thursday July 18, 2016 at 19:30AM_  Arrival Time for Procedure:_Arrive at 07:15AM to register__  Instructions regarding medication:   __X__ : Hold diabetes medication Metformin (Glucophage) the morning of procedure   PLEASE NOTIFY THE OFFICE AT LEAST 24 HOURS IN ADVANCE IF YOU ARE UNABLE TO KEEP YOUR APPOINTMENT.  [phone] AND    PLEASE NOTIFY NUCLEAR MEDICINE AT [facility] AT LEAST 24 HOURS IN ADVANCE IF YOU ARE UNABLE TO KEEP YOUR APPOINTMENT. [phone]  How to prepare for your Myoview test:  1. Do not eat or drink after midnight 2. No caffeine for 24 hours prior to test 3. No smoking 24 hours prior to test. 4. Your medication may be taken with water.  If your doctor stopped a medication because of this test, do not take that medication. 5. Ladies, please do not wear dresses.  Skirts or pants are appropriate. Please wear a short sleeve shirt. 6. No perfume, cologne or lotion. 7. Wear comfortable walking shoes. No heels!   Follow-Up: Your physician recommends that you schedule a follow-up appointment after testing with Dr. Yvone Neu.  It was a pleasure seeing you today here in the office. Please do not hesitate to give [place] a call back if you have any further questions. [place], BSN   Echocardiogram An echocardiogram, or echocardiography, uses sound waves (ultrasound) to produce an image of your heart. The echocardiogram is simple, painless, obtained within a short period of time, and offers valuable information to your health care provider. The images from an echocardiogram can provide information such as:  Evidence of coronary artery disease (CAD).  Heart size.  Heart muscle function.  Heart valve function.  Aneurysm detection.  Evidence of a past heart attack.  Fluid buildup around the heart.  Heart muscle thickening.  Assess heart valve function. Tell a health care provider about:  Any allergies you have.  All medicines you are taking, including vitamins, herbs, eye drops, creams, and over-the-counter  medicines.  Any problems you or family members have had with anesthetic medicines.  Any blood disorders you have.  Any surgeries you have had.  Any medical conditions you have.  Whether you are pregnant or may be pregnant. What happens before the procedure? No special  preparation is needed. Eat and drink normally. What happens during the procedure?  In order to produce an image of your heart, gel will be applied to your chest and a wand-like tool (transducer) will be moved over your chest. The gel will help transmit the sound waves from the transducer. The sound waves will harmlessly bounce off your heart to allow the heart images to be captured in real-time motion. These images will then be recorded.  You may need an IV to receive a medicine that improves the quality of the pictures. What happens after the procedure? You may return to your normal schedule including diet, activities, and medicines, unless your health care provider tells you otherwise. This information is not intended to replace advice given to you by your health care provider. Make sure you discuss any questions you have with your health care provider. Document Released: 04/19/2000 Document Revised: 12/09/2015 Document Reviewed: 12/28/2012 Elsevier Interactive Patient Education  2017 [employer]. Pharmacologic Stress Electrocardiogram Introduction A pharmacologic stress electrocardiogram is a heart (cardiac) test that uses nuclear imaging to evaluate the blood supply to your heart. This test may also be called a pharmacologic stress electrocardiography. Pharmacologic means that a medicine is used to increase your heart rate and blood pressure. This stress test is done to find areas of poor blood flow to the heart by determining the extent of coronary artery disease (CAD). Some people exercise on a treadmill, which naturally increases the blood flow to the heart. For those people unable to exercise on a treadmill, a medicine is used. This medicine stimulates your heart and will cause your heart to beat harder and more quickly, as if you were exercising. Pharmacologic stress tests can help determine:  The adequacy of blood flow to your heart during increased levels of activity in order to clear  you for discharge home.  The extent of coronary artery blockage caused by CAD.  Your prognosis if you have suffered a heart attack.  The effectiveness of cardiac procedures done, such as an angioplasty, which can increase the circulation in your coronary arteries.  Causes of chest pain or pressure. LET [facility] CARE PROVIDER KNOW ABOUT:  Any allergies you have.  All medicines you are taking, including vitamins, herbs, eye drops, creams, and over-the-counter medicines.  Previous problems you or members of your family have had with the use of anesthetics.  Any blood disorders you have.  Previous surgeries you have had.  Medical conditions you have.  Possibility of pregnancy, if this applies.  If you are currently breastfeeding. RISKS AND COMPLICATIONS Generally, this is a safe procedure. However, as with any procedure, complications can occur. Possible complications include:  You develop pain or pressure in the following areas:  Chest.  Jaw or neck.  Between your shoulder blades.  Radiating down your left arm.  Headache.  Dizziness or light-headedness.  Shortness of breath.  Increased or irregular heartbeat.  Low blood pressure.  Nausea or vomiting.  Flushing.  Redness going up the arm and slight pain during injection of medicine.  Heart attack (rare). BEFORE THE PROCEDURE  Avoid all forms of caffeine for 24 hours before your test or as directed by your health care provider. This includes coffee,  tea (even decaffeinated tea), caffeinated sodas, chocolate, cocoa, and certain pain medicines.  Follow your health care provider's instructions regarding eating and drinking before the test.  Take your medicines as directed at regular times with water unless instructed otherwise. Exceptions may include:  If you have diabetes, ask how you are to take your insulin or pills. It is common to adjust insulin dosing the morning of the test.  If you are taking  beta-blocker medicines, it is important to talk to your health care provider about these medicines well before the date of your test. Taking beta-blocker medicines may interfere with the test. In some cases, these medicines need to be changed or stopped 24 hours or more before the test.  If you wear a nitroglycerin patch, it may need to be removed prior to the test. Ask your health care provider if the patch should be removed before the test.  If you use an inhaler for any breathing condition, bring it with you to the test.  If you are an outpatient, bring a snack so you can eat right after the stress phase of the test.  Do not smoke for 4 hours prior to the test or as directed by your health care provider.  Do not apply lotions, powders, creams, or oils on your chest prior to the test.  Wear comfortable shoes and clothing. Let your health care provider know if you were unable to complete or follow the preparations for your test. PROCEDURE  Multiple patches (electrodes) will be put on your chest. If needed, small areas of your chest may be shaved to get better contact with the electrodes. Once the electrodes are attached to your body, multiple wires will be attached to the electrodes, and your heart rate will be monitored.  An IV access will be started. A nuclear trace (isotope) is given. The isotope may be given intravenously, or it may be swallowed. Nuclear refers to several types of radioactive isotopes, and the nuclear isotope lights up the arteries so that the nuclear images are clear. The isotope is absorbed by your body. This results in low radiation exposure.  A resting nuclear image is taken to show how your heart functions at rest.  A medicine is given through the IV access.  A second scan is done about 1 hour after the medicine injection and determines how your heart functions under stress.  During this stress phase, you will be connected to an electrocardiogram machine. Your  blood pressure and oxygen levels will be monitored. What to expect after the procedure  Your heart rate and blood pressure will be monitored after the test.  You may return to your normal schedule, including diet,activities, and medicines, unless your health care provider tells you otherwise. This information is not intended to replace advice given to you by your health care provider. Make sure you discuss any questions you have with your health care provider. Document Released: 09/08/2008 Document Revised: 09/28/2015 Document Reviewed: 10/30/2015 Elsevier Interactive Patient Education  2017 [employer].

## 2016-07-10 NOTE — Progress Notes (Signed)
Cardiology Office Note   Date:  07/10/2016   ID:  Diana Harmon, DOB 1946-03-28, MRN [id number]  Referring Doctor:  Dicky Doe, MD   Cardiologist:   Wende Bushy, MD   Reason for consultation:  Chief Complaint  Patient presents with  . Other    Ref by Dr. Luan Pulling for syncope & heart murmur. Meds reviewed by the pt. verbally. Pt. c/o a syncopal spell about 1 week ago with chest pain, dizzy, shortness of breath and broke out into a sweat.       History of Present Illness: Diana Harmon is a 71 y.o. female who presents for Lightheadedness and dizziness, shortness of breath  About a week ago, patient noted sudden onset of feeling lightheaded and dizzy while she walked in from outside to go sit down on a chair. She felt her stomach rising and felt hot and sweaty, room spinning. She noted that her vision started turning dark but did not complete lately pass out. After a few minutes, symptoms went away. No recurrence since then. Next  Patient mentions that she has not been monitoring her blood pressure. She has lost probably 30-40 pounds over the last year or so due to difficulty with GI issues. She was not tolerating fatty or greasy food and then that problem has since resolved  She has had this shortness of breath for the last 2 or 3 years, walking short distances, moderate in intensity, resolved with rest. They've noticed that it has progressed somewhat over the years. She denies chest pain. To begin with, she has difficulty with mobility and uses a cane.  No true syncope, PND, orthopnea.   ROS:  Please see the history of present illness. Aside from mentioned under HPI, all other systems are reviewed and negative.     Past Medical History:  Diagnosis Date  . Anemia   . Benign tumor   . Diabetes mellitus without complication ([place])   . Hyperlipidemia   . Hypertension   . Nausea   . Patient is [name]   . Thyroid disease     Past Surgical  History:  Procedure Laterality Date  . COLONOSCOPY    . COLONOSCOPY WITH PROPOFOL N/A 03/13/2015   Procedure: COLONOSCOPY WITH PROPOFOL;  Surgeon: Manya Silvas, MD;  Location: [facility] ENDOSCOPY;  Service: Endoscopy;  Laterality: N/A;  . ESOPHAGOGASTRODUODENOSCOPY N/A 03/13/2015   Procedure: ESOPHAGOGASTRODUODENOSCOPY (EGD);  Surgeon: Manya Silvas, MD;  Location: [facility] ENDOSCOPY;  Service: Endoscopy;  Laterality: N/A;  . THYROIDECTOMY    . tumor removed       reports that she has never smoked. She has never used smokeless tobacco. She reports that she drinks about 0.6 oz of alcohol per week . She reports that she does not use drugs.   family history includes Aneurysm in her son; Heart disease in her father; Lupus in her sister and son; Parkinson's disease in her mother.   Outpatient Medications Prior to Visit  Medication Sig Dispense Refill  . acetaminophen (TYLENOL) 500 MG tablet Take 1,000 mg by mouth every 6 (six) hours as needed for moderate pain.    Marland Kitchen aspirin EC 81 MG tablet Take 1 tablet (81 mg total) by mouth daily. 100 tablet 12  . ferrous sulfate 325 (65 FE) MG tablet Take 1 tablet (325 mg total) by mouth 2 (two) times daily with a meal. 60 tablet 6  . hydrochlorothiazide (HYDRODIURIL) 12.5 MG tablet Take 1 tablet each morning for BP and  fluid. 90 tablet 3  . lactobacillus acidophilus (BACID) TABS tablet Take 2 tablets by mouth 2 (two) times daily.    Marland Kitchen levocetirizine (XYZAL) 5 MG tablet Take 1 tablet (5 mg total) by mouth every evening. 30 tablet 12  . losartan (COZAAR) 100 MG tablet Take 1 tablet (100 mg total) by mouth daily. 90 tablet 3  . metFORMIN (GLUCOPHAGE) 500 MG tablet Take 2 tablets (1,000 mg total) by mouth 2 (two) times daily with a meal. 360 tablet 1  . ranitidine (ZANTAC 75) 75 MG tablet Take 1 tablet (75 mg total) by mouth 2 (two) times daily. 60 tablet 12  . simvastatin (ZOCOR) 20 MG tablet Take 1 tablet (20 mg total) by mouth daily at 6 PM. 30 tablet 6   No  facility-administered medications prior to visit.      Allergies: Ferumoxytol; Other; and Penicillins    PHYSICAL EXAM: VS:  BP (!) 110/58 (BP Location: Right Arm, Patient Position: Sitting, Cuff Size: Large)   Pulse 80   Ht 5\' 1"  (1.549 m)   Wt 216 lb (98 kg)   BMI 40.81 kg/m  , Body mass index is 40.81 kg/m. Wt Readings from Last 3 Encounters:  07/10/16 216 lb (98 kg)  07/04/16 214 lb (97.1 kg)  05/16/16 217 lb (98.4 kg)    Orthostatic VS for the past 24 hrs:  BP- Lying Pulse- Lying BP- Sitting Pulse- Sitting BP- Standing at 0 minutes Pulse- Standing at 0 minutes  07/10/16 1159 118/70 87 116/70 88 120/72 98     GENERAL:  well developed, well nourished, obese, not in acute distress HEENT: normocephalic, pink conjunctivae, anicteric sclerae, no xanthelasma, normal dentition, oropharynx clear NECK:  no neck vein engorgement, JVP normal, no hepatojugular reflux, carotid upstroke brisk and symmetric, no bruit, no thyromegaly, no lymphadenopathy LUNGS:  good respiratory effort, clear to auscultation bilaterally CV:  PMI not displaced, no thrills, no lifts, S1 and S2 within normal limits, no palpable S3 or S4, systolic ejection murmur, no rubs, no gallops ABD:  Soft, nontender, nondistended, normoactive bowel sounds, no abdominal aortic bruit, no hepatomegaly, no splenomegaly MS: nontender back, no kyphosis, no scoliosis, no joint deformities EXT:  2+ DP/PT pulses, no edema, no varicosities, no cyanosis, no clubbing SKIN: warm, nondiaphoretic, normal turgor, no ulcers NEUROPSYCH: alert, oriented to person, place, and time, sensory/motor grossly intact, normal mood, appropriate affect  Recent Labs: 07/04/2016: ALT 29; BUN 16; Creat 0.93; Hemoglobin 11.6; Platelets 238; Potassium 3.9; Sodium 141; TSH 2.11   Lipid Panel    Component Value Date/Time   CHOL 187 06/03/2016 0001   TRIG 129 06/03/2016 0001   HDL 56 06/03/2016 0001   CHOLHDL 3.3 06/03/2016 0001   VLDL 26 06/03/2016 0001    LDLCALC 105 (H) 06/03/2016 0001     Other studies Reviewed:  EKG:  The ekg from 07/10/2016 was personally reviewed by me and it revealed sinus rhythm, 80 BPM, right bundle branch block.  Additional studies/ records that were reviewed personally reviewed by me today include: None available   ASSESSMENT AND PLAN: Lightheadedness or dizziness Heart rate went up to 10 points from sitting to standing position, no significant drop in blood pressure Pressure is on the lower end of normal Most likely, her symptoms are related to relative hypotension or orthostatic drop in her blood pressure Would benefit from discontinuing HCTZ. Patient to call and inform her PCP about this. Recommend blood pressure monitoring  Shortness of breath Abnormal EKG with right bundle branch  block. No active chest pain. No active shortness of breath. Recommend further evaluation with pharmacologic nuclear stress test and echocardiogram.  Systolic ejection murmur Recommend echocardiogram.  HTN Please see recommendations as above  Hyperlipidemia Due to diabetes, ideal LDL goal is less than 70. PCP following labs.   Current medicines are reviewed at length with the patient today.  The patient does not have concerns regarding medicines.  Labs/ tests ordered today include:  Orders Placed This Encounter  Procedures  . NM Myocar Multi W/Spect W/Wall Motion / EF  . EKG 12-Lead  . ECHOCARDIOGRAM COMPLETE    I had a lengthy and detailed discussion with the patient regarding diagnoses, prognosis, diagnostic options.  I counseled the patient on importance of lifestyle modification including heart healthy diet, regular physical activity once cardiac work up is completed.   Disposition:   FU with undersigned after tests   Thank you for this consultation. We will forwarding this consultation to referring physician.   Signed, Wende Bushy, MD  07/10/2016 1:49 PM    [place]  This note was generated in part with voice recognition software and I apologize for any typographical errors that were not detected and corrected.

## 2016-07-18 ENCOUNTER — Ambulatory Visit
Admission: RE | Admit: 2016-07-18 | Discharge: 2016-07-18 | Disposition: A | Discharge status: Home / Self Care | Payer: Medicare Other | Source: Ambulatory Visit | Attending: Cardiology | Admitting: Cardiology

## 2016-07-18 DIAGNOSIS — R0602 Shortness of breath: Secondary | ICD-10-CM | POA: Diagnosis not present

## 2016-07-18 DIAGNOSIS — I4510 Unspecified right bundle-branch block: Secondary | ICD-10-CM

## 2016-07-18 DIAGNOSIS — E784 Other hyperlipidemia: Secondary | ICD-10-CM

## 2016-07-18 DIAGNOSIS — E7849 Other hyperlipidemia: Secondary | ICD-10-CM

## 2016-07-18 DIAGNOSIS — R0789 Other chest pain: Secondary | ICD-10-CM

## 2016-07-18 DIAGNOSIS — R42 Dizziness and giddiness: Secondary | ICD-10-CM | POA: Diagnosis not present

## 2016-07-18 DIAGNOSIS — I10 Essential (primary) hypertension: Secondary | ICD-10-CM | POA: Diagnosis not present

## 2016-07-18 LAB — NM MYOCAR MULTI W/SPECT W/WALL MOTION / EF
CHL CUP NUCLEAR SRS: 21
CHL CUP NUCLEAR SSS: 19
CHL CUP STRESS STAGE 3 HR: 88 {beats}/min
CHL CUP STRESS STAGE 3 SPEED: 0 mph
CHL CUP STRESS STAGE 4 DBP: 62 mmHg
CSEPEW: 1 METS
CSEPHR: 67 %
CSEPPHR: 88 {beats}/min
CSEPPMHR: 58 %
LVDIAVOL: 73 mL (ref 46–106)
LVSYSVOL: 25 mL
Rest HR: 73 {beats}/min
SDS: 0
Stage 1 Grade: 0 %
Stage 1 HR: 75 {beats}/min
Stage 1 Speed: 0 mph
Stage 2 Grade: 0 %
Stage 2 HR: 75 {beats}/min
Stage 2 Speed: 0 mph
Stage 3 Grade: 0 %
Stage 4 Grade: 0 %
Stage 4 HR: 83 {beats}/min
Stage 4 SBP: 136 mmHg
Stage 4 Speed: 0 mph
TID: 1.06

## 2016-07-18 MED ORDER — TECHNETIUM TC 99M TETROFOSMIN IV KIT
31.8100 | PACK | Freq: Once | INTRAVENOUS | Status: AC | PRN
  Administered 2016-07-18: 31.81 via INTRAVENOUS

## 2016-07-18 MED ORDER — REGADENOSON 0.4 MG/5ML IV SOLN
0.4000 mg | Freq: Once | INTRAVENOUS | Status: AC
  Administered 2016-07-18: 0.4 mg via INTRAVENOUS

## 2016-07-18 MED ORDER — TECHNETIUM TC 99M TETROFOSMIN IV KIT
13.0000 | PACK | Freq: Once | INTRAVENOUS | Status: AC | PRN
  Administered 2016-07-18: 12.55 via INTRAVENOUS

## 2016-07-30 DIAGNOSIS — R55 Syncope and collapse: Secondary | ICD-10-CM | POA: Diagnosis not present

## 2016-07-30 DIAGNOSIS — R42 Dizziness and giddiness: Secondary | ICD-10-CM | POA: Diagnosis not present

## 2016-08-01 ENCOUNTER — Other Ambulatory Visit: Payer: Self-pay | Admitting: Neurology

## 2016-08-01 DIAGNOSIS — R55 Syncope and collapse: Secondary | ICD-10-CM

## 2016-08-01 DIAGNOSIS — R42 Dizziness and giddiness: Secondary | ICD-10-CM

## 2016-08-08 ENCOUNTER — Other Ambulatory Visit: Payer: Self-pay

## 2016-08-08 ENCOUNTER — Ambulatory Visit (INDEPENDENT_AMBULATORY_CARE_PROVIDER_SITE_OTHER): Payer: Medicare Other

## 2016-08-08 DIAGNOSIS — R0602 Shortness of breath: Secondary | ICD-10-CM

## 2016-08-08 DIAGNOSIS — R42 Dizziness and giddiness: Secondary | ICD-10-CM

## 2016-08-08 DIAGNOSIS — E784 Other hyperlipidemia: Secondary | ICD-10-CM | POA: Diagnosis not present

## 2016-08-08 DIAGNOSIS — R0789 Other chest pain: Secondary | ICD-10-CM

## 2016-08-08 DIAGNOSIS — I4510 Unspecified right bundle-branch block: Secondary | ICD-10-CM

## 2016-08-08 DIAGNOSIS — I10 Essential (primary) hypertension: Secondary | ICD-10-CM

## 2016-08-08 DIAGNOSIS — E7849 Other hyperlipidemia: Secondary | ICD-10-CM

## 2016-08-14 ENCOUNTER — Inpatient Hospital Stay: Payer: Medicare Other | Attending: Internal Medicine

## 2016-08-14 ENCOUNTER — Other Ambulatory Visit: Payer: Self-pay | Admitting: *Deleted

## 2016-08-14 DIAGNOSIS — R42 Dizziness and giddiness: Secondary | ICD-10-CM | POA: Diagnosis not present

## 2016-08-14 DIAGNOSIS — Z7984 Long term (current) use of oral hypoglycemic drugs: Secondary | ICD-10-CM | POA: Diagnosis not present

## 2016-08-14 DIAGNOSIS — E079 Disorder of thyroid, unspecified: Secondary | ICD-10-CM | POA: Diagnosis not present

## 2016-08-14 DIAGNOSIS — E785 Hyperlipidemia, unspecified: Secondary | ICD-10-CM | POA: Insufficient documentation

## 2016-08-14 DIAGNOSIS — D508 Other iron deficiency anemias: Secondary | ICD-10-CM

## 2016-08-14 DIAGNOSIS — I10 Essential (primary) hypertension: Secondary | ICD-10-CM | POA: Diagnosis not present

## 2016-08-14 DIAGNOSIS — E119 Type 2 diabetes mellitus without complications: Secondary | ICD-10-CM | POA: Insufficient documentation

## 2016-08-14 DIAGNOSIS — Z7982 Long term (current) use of aspirin: Secondary | ICD-10-CM | POA: Diagnosis not present

## 2016-08-14 DIAGNOSIS — R110 Nausea: Secondary | ICD-10-CM | POA: Diagnosis not present

## 2016-08-14 DIAGNOSIS — Z79899 Other long term (current) drug therapy: Secondary | ICD-10-CM | POA: Insufficient documentation

## 2016-08-14 DIAGNOSIS — D509 Iron deficiency anemia, unspecified: Secondary | ICD-10-CM | POA: Insufficient documentation

## 2016-08-14 DIAGNOSIS — H903 Sensorineural hearing loss, bilateral: Secondary | ICD-10-CM | POA: Diagnosis not present

## 2016-08-14 LAB — BASIC METABOLIC PANEL
Anion gap: 8 (ref 5–15)
BUN: 21 mg/dL — AB (ref 6–20)
CALCIUM: 9.3 mg/dL (ref 8.9–10.3)
CO2: 28 mmol/L (ref 22–32)
CREATININE: 0.91 mg/dL (ref 0.44–1.00)
Chloride: 101 mmol/L (ref 101–111)
GFR calc Af Amer: >60 mL/min (ref >60)
GFR calc non Af Amer: >60 mL/min (ref >60)
Glucose, Bld: 128 mg/dL — ABNORMAL HIGH (ref 65–99)
Potassium: 3.9 mmol/L (ref 3.5–5.1)
SODIUM: 137 mmol/L (ref 135–145)

## 2016-08-14 LAB — CBC WITH DIFFERENTIAL/PLATELET
BASOS ABS: 0 10*3/uL (ref 0–0.1)
Basophils Relative: 1 %
Eosinophils Absolute: 0.1 10*3/uL (ref 0–0.7)
Eosinophils Relative: 3 %
HEMATOCRIT: 35.3 % (ref 35.0–47.0)
HEMOGLOBIN: 11.5 g/dL — AB (ref 12.0–16.0)
Lymphocytes Relative: 39 %
Lymphs Abs: 1.3 10*3/uL (ref 1.0–3.6)
MCH: 30.2 pg (ref 26.0–34.0)
MCHC: 32.5 g/dL (ref 32.0–36.0)
MCV: 92.9 fL (ref 80.0–100.0)
Monocytes Absolute: 0.5 10*3/uL (ref 0.2–0.9)
Monocytes Relative: 16 %
NEUTROS ABS: 1.4 10*3/uL (ref 1.4–6.5)
Neutrophils Relative %: 41 %
Platelets: 218 10*3/uL (ref 150–440)
RBC: 3.81 MIL/uL (ref 3.80–5.20)
RDW: 13.9 % (ref 11.5–14.5)
WBC: 3.4 10*3/uL — AB (ref 3.6–11.0)

## 2016-08-14 LAB — IRON AND TIBC
Iron: 56 ug/dL (ref 28–170)
Saturation Ratios: 17 % (ref 10.4–31.8)
TIBC: 333 ug/dL (ref 250–450)
UIBC: 277 ug/dL

## 2016-08-14 LAB — FERRITIN: Ferritin: 370 ng/mL — ABNORMAL HIGH (ref 11–307)

## 2016-08-15 ENCOUNTER — Encounter: Payer: Self-pay | Admitting: Cardiology

## 2016-08-15 ENCOUNTER — Ambulatory Visit (INDEPENDENT_AMBULATORY_CARE_PROVIDER_SITE_OTHER): Payer: Medicare Other | Admitting: Cardiology

## 2016-08-15 VITALS — BP 136/68 | HR 76 | Ht 60.0 in | Wt 217.5 lb

## 2016-08-15 DIAGNOSIS — I351 Nonrheumatic aortic (valve) insufficiency: Secondary | ICD-10-CM | POA: Diagnosis not present

## 2016-08-15 DIAGNOSIS — E784 Other hyperlipidemia: Secondary | ICD-10-CM

## 2016-08-15 DIAGNOSIS — I10 Essential (primary) hypertension: Secondary | ICD-10-CM | POA: Diagnosis not present

## 2016-08-15 DIAGNOSIS — I4510 Unspecified right bundle-branch block: Secondary | ICD-10-CM | POA: Diagnosis not present

## 2016-08-15 DIAGNOSIS — I350 Nonrheumatic aortic (valve) stenosis: Secondary | ICD-10-CM | POA: Diagnosis not present

## 2016-08-15 DIAGNOSIS — E7849 Other hyperlipidemia: Secondary | ICD-10-CM

## 2016-08-15 NOTE — Progress Notes (Addendum)
Cardiology Office Note   Date:  08/15/2016   ID:  Diana Harmon, DOB [birth year], MRN [id number]  Referring Doctor:  [name], DO   Cardiologist:   Wende Bushy, MD   Reason for consultation:  Chief Complaint  Patient presents with  . other    follow up from testing. Patient states she has been doing good. Meds reviewed verbally with patient.       History of Present Illness: Diana Harmon is a 71 y.o. female who presents for ffup after testing  Since last visit, patient has had no lightheadedness or dizziness. No syncope. No chest pain or shortness of breath.  ROS:  Please see the history of present illness. Aside from mentioned under HPI, all other systems are reviewed and negative.    Past Medical History:  Diagnosis Date  . Anemia   . Benign tumor   . Diabetes mellitus without complication ([place])   . Hyperlipidemia   . Hypertension   . Nausea   . Patient is [name]   . Thyroid disease     Past Surgical History:  Procedure Laterality Date  . COLONOSCOPY    . COLONOSCOPY WITH PROPOFOL N/A 03/13/2015   Procedure: COLONOSCOPY WITH PROPOFOL;  Surgeon: Manya Silvas, MD;  [facility] ENDOSCOPY;  Service: Endoscopy;  Laterality: N/A;  . ESOPHAGOGASTRODUODENOSCOPY N/A 03/13/2015   Procedure: ESOPHAGOGASTRODUODENOSCOPY (EGD);  Surgeon: Manya Silvas, MD;  Location: [facility] ENDOSCOPY;  Service: Endoscopy;  Laterality: N/A;  . THYROIDECTOMY    . tumor removed       reports that she has never smoked. She has never used smokeless tobacco. She reports that she drinks about 0.6 oz of alcohol per week . She reports that she does not use drugs.   family history includes Aneurysm in her son; Heart disease in her father; Lupus in her sister and son; Parkinson's disease in her mother.   Outpatient Medications Prior to Visit  Medication Sig Dispense Refill  . acetaminophen (TYLENOL) 500 MG tablet Take 1,000 mg by mouth every 6  (six) hours as needed for moderate pain.    Marland Kitchen aspirin EC 81 MG tablet Take 1 tablet (81 mg total) by mouth daily. 100 tablet 12  . ferrous sulfate 325 (65 FE) MG tablet Take 1 tablet (325 mg total) by mouth 2 (two) times daily with a meal. 60 tablet 6  . lactobacillus acidophilus (BACID) TABS tablet Take 2 tablets by mouth 2 (two) times daily.    Marland Kitchen levocetirizine (XYZAL) 5 MG tablet Take 1 tablet (5 mg total) by mouth every evening. 30 tablet 12  . loratadine (CLARITIN) 10 MG tablet Take 10 mg by mouth daily.    Marland Kitchen losartan (COZAAR) 100 MG tablet Take 1 tablet (100 mg total) by mouth daily. 90 tablet 3  . metFORMIN (GLUCOPHAGE) 500 MG tablet Take 2 tablets (1,000 mg total) by mouth 2 (two) times daily with a meal. 360 tablet 1  . ranitidine (ZANTAC 75) 75 MG tablet Take 1 tablet (75 mg total) by mouth 2 (two) times daily. 60 tablet 12  . simvastatin (ZOCOR) 20 MG tablet Take 1 tablet (20 mg total) by mouth daily at 6 PM. 30 tablet 6  . hydrochlorothiazide (HYDRODIURIL) 12.5 MG tablet Take 1 tablet each morning for BP and fluid. 90 tablet 3   No facility-administered medications prior to visit.      Allergies: Ferumoxytol; Other; and Penicillins    PHYSICAL EXAM: VS:  BP 136/68 (BP  Location: Left Arm, Patient Position: Sitting, Cuff Size: Normal)   Pulse 76   Ht 5' (1.524 m)   Wt 217 lb 8 oz (98.7 kg)   BMI 42.48 kg/m  , Body mass index is 42.48 kg/m. Wt Readings from Last 3 Encounters:  08/15/16 217 lb 8 oz (98.7 kg)  07/10/16 216 lb (98 kg)  07/04/16 214 lb (97.1 kg)    GENERAL:  well developed, well nourished, obese, not in acute distress HEENT: normocephalic, pink conjunctivae, anicteric sclerae, no xanthelasma, normal dentition, oropharynx clear NECK:  no neck vein engorgement, JVP normal, no hepatojugular reflux, carotid upstroke brisk and symmetric, no bruit, no thyromegaly, no lymphadenopathy LUNGS:  good respiratory effort, clear to auscultation bilaterally CV:  PMI not  displaced, no thrills, no lifts, S1 and S2 within normal limits, no palpable S3 or S4, 2/6SEM, no rubs, no gallops ABD:  Soft, nontender, nondistended, normoactive bowel sounds, no abdominal aortic bruit, no hepatomegaly, no splenomegaly MS: nontender back, no kyphosis, no scoliosis, no joint deformities EXT:  2+ DP/PT pulses, no edema, no varicosities, no cyanosis, no clubbing SKIN: warm, nondiaphoretic, normal turgor, no ulcers NEUROPSYCH: alert, oriented to person, place, and time, sensory/motor grossly intact, normal mood, appropriate affect  Recent Labs: 07/04/2016: ALT 29; TSH 2.11 08/14/2016: BUN 21; Creatinine, Ser 0.91; Hemoglobin 11.5; Platelets 218; Potassium 3.9; Sodium 137   Lipid Panel    Component Value Date/Time   CHOL 187 06/03/2016 0001   TRIG 129 06/03/2016 0001   HDL 56 06/03/2016 0001   CHOLHDL 3.3 06/03/2016 0001   VLDL 26 06/03/2016 0001   LDLCALC 105 (H) 06/03/2016 0001     Other studies Reviewed:  EKG:  The ekg from 07/10/2016 was personally reviewed by me and it revealed sinus rhythm, 80 BPM, right bundle branch block.  Additional studies/ records that were reviewed personally reviewed by me today include:  Echo 08/08/2016: Left ventricle: The cavity size was normal. There was mild focal   basal and mild concentric hypertrophy of the septum. Systolic   function was normal. The estimated ejection fraction was in the   range of 55% to 60%. Wall motion was normal; there were no   regional wall motion abnormalities. Doppler parameters are   consistent with abnormal left ventricular relaxation (grade 1   diastolic dysfunction). - Aortic valve: Transvalvular velocity was minimally increased.   There was very mild stenosis. There was trivial regurgitation.   Peak velocity (S): 261 cm/s. Mean gradient (S): 15 mm Hg. - Left atrium: The atrium was normal in size. - Right ventricle: Systolic function was normal. - Pulmonary arteries: Systolic pressure was within the  normal   range.  Nuclear stress test 07/18/2016: Pharmacological myocardial perfusion imaging study with no significant  ischemia Normal wall motion, EF estimated at 61% No EKG changes concerning for ischemia at peak stress or in recovery. Resting EKG with right bundle branch block Low risk scan   ASSESSMENT AND PLAN: Lightheadedness or dizziness - no recurrence In light of finding of very mild AS, will d/c HCTZ.  Recommend blood pressure monitoring  Shortness of breath Abnormal EKG with right bundle branch block. No active chest pain. No active shortness of breath. No evidence of ischemia and stresses. LVEF normal. Likelihood of clinically significant CAD is low. Findings discussed with patient. Patient reassured. Continue risk factor modification.  AS, very mild AI, trivial  Discussed these findings with patient and family member. Recommend serial echocardiogram, rpt in 1 year.  HTN Continue monitoring  blood pressure, will discontinue HCTZ. Agree with losartan.  Hyperlipidemia Due to diabetes, ideal LDL goal is less than 70. PCP following labs.   Current medicines are reviewed at length with the patient today.  The patient does not have concerns regarding medicines.  Labs/ tests ordered today include:  Orders Placed This Encounter  Procedures  . ECHOCARDIOGRAM COMPLETE    I had a lengthy and detailed discussion with the patient regarding diagnoses, prognosis, diagnostic options.  I counseled the patient on importance of lifestyle modification including heart healthy diet, regular physical activity.   Disposition:   FU with Cardiology in one year   I spent at least 25 minutes with the patient today and more than 50% of the time was spent counseling the patient and coordinating care.    Signed, Wende Bushy, MD  08/15/2016 12:44 PM    [place]  This note was generated in part with voice recognition software and I apologize for any  typographical errors that were not detected and corrected.

## 2016-08-15 NOTE — Patient Instructions (Addendum)
Medication Instructions:  Your physician has recommended you make the following change in your medication:  1- STOP taking your HCTZ (Hydrochlorothiazide). - Please monitor your blood pressure and contact us if it is elevated. Or stays consistently 140/80   Labwork: NONE  Testing/Procedures: Your physician has requested that you have an echocardiogram. Echocardiography is a painless test that uses sound waves to create images of your heart. It provides your doctor with information about the size and shape of your heart and how well your heart's chambers and valves are working. This procedure takes approximately one hour. There are no restrictions for this procedure. - IN ONE YEAR.   Follow-Up: Your physician wants you to follow-up in: [place] ECHOCARDIOGRAM. You will receive a reminder letter in the mail two months in advance. If you don't receive a letter, please call our office to schedule the follow-up appointment.   If you need a refill on your cardiac medications before your next appointment, please call your pharmacy.  Echocardiogram An echocardiogram, or echocardiography, uses sound waves (ultrasound) to produce an image of your heart. The echocardiogram is simple, painless, obtained within a short period of time, and offers valuable information to your health care provider. The images from an echocardiogram can provide information such as:  Evidence of coronary artery disease (CAD).  Heart size.  Heart muscle function.  Heart valve function.  Aneurysm detection.  Evidence of a past heart attack.  Fluid buildup around the heart.  Heart muscle thickening.  Assess heart valve function. Tell a health care provider about:  Any allergies you have.  All medicines you are taking, including vitamins, herbs, eye drops, creams, and over-the-counter medicines.  Any problems you or family members have had with anesthetic medicines.  Any blood disorders you  have.  Any surgeries you have had.  Any medical conditions you have.  Whether you are pregnant or may be pregnant. What happens before the procedure? No special preparation is needed. Eat and drink normally. What happens during the procedure?  In order to produce an image of your heart, gel will be applied to your chest and a wand-like tool (transducer) will be moved over your chest. The gel will help transmit the sound waves from the transducer. The sound waves will harmlessly bounce off your heart to allow the heart images to be captured in real-time motion. These images will then be recorded.  You may need an IV to receive a medicine that improves the quality of the pictures. What happens after the procedure? You may return to your normal schedule including diet, activities, and medicines, unless your health care provider tells you otherwise. This information is not intended to replace advice given to you by your health care provider. Make sure you discuss any questions you have with your health care provider. Document Released: 04/19/2000 Document Revised: 12/09/2015 Document Reviewed: 12/28/2012 Elsevier Interactive Patient Education  2017 [employer].

## 2016-08-16 DIAGNOSIS — R42 Dizziness and giddiness: Secondary | ICD-10-CM | POA: Diagnosis not present

## 2016-08-17 ENCOUNTER — Ambulatory Visit
Admission: RE | Admit: 2016-08-17 | Discharge: 2016-08-17 | Disposition: A | Discharge status: Home / Self Care | Payer: Medicare Other | Source: Ambulatory Visit | Attending: Neurology | Admitting: Neurology

## 2016-08-17 DIAGNOSIS — R9089 Other abnormal findings on diagnostic imaging of central nervous system: Secondary | ICD-10-CM | POA: Insufficient documentation

## 2016-08-17 DIAGNOSIS — R55 Syncope and collapse: Secondary | ICD-10-CM | POA: Insufficient documentation

## 2016-08-17 DIAGNOSIS — R42 Dizziness and giddiness: Secondary | ICD-10-CM | POA: Diagnosis not present

## 2016-08-17 DIAGNOSIS — R51 Headache: Secondary | ICD-10-CM | POA: Diagnosis not present

## 2016-08-17 MED ORDER — GADOBENATE DIMEGLUMINE 529 MG/ML IV SOLN
20.0000 mL | Freq: Once | INTRAVENOUS | Status: AC | PRN
Start: 2016-08-17 — End: 2016-08-17
  Administered 2016-08-17: 20 mL via INTRAVENOUS

## 2016-08-20 ENCOUNTER — Inpatient Hospital Stay (HOSPITAL_BASED_OUTPATIENT_CLINIC_OR_DEPARTMENT_OTHER): Payer: Medicare Other | Admitting: Internal Medicine

## 2016-08-20 ENCOUNTER — Ambulatory Visit: Payer: Medicare Other | Admitting: Internal Medicine

## 2016-08-20 ENCOUNTER — Inpatient Hospital Stay: Payer: Medicare Other

## 2016-08-20 DIAGNOSIS — D509 Iron deficiency anemia, unspecified: Secondary | ICD-10-CM | POA: Diagnosis not present

## 2016-08-20 DIAGNOSIS — E119 Type 2 diabetes mellitus without complications: Secondary | ICD-10-CM

## 2016-08-20 DIAGNOSIS — E079 Disorder of thyroid, unspecified: Secondary | ICD-10-CM

## 2016-08-20 DIAGNOSIS — Z79899 Other long term (current) drug therapy: Secondary | ICD-10-CM | POA: Diagnosis not present

## 2016-08-20 DIAGNOSIS — E785 Hyperlipidemia, unspecified: Secondary | ICD-10-CM | POA: Diagnosis not present

## 2016-08-20 DIAGNOSIS — R110 Nausea: Secondary | ICD-10-CM

## 2016-08-20 DIAGNOSIS — Z7984 Long term (current) use of oral hypoglycemic drugs: Secondary | ICD-10-CM | POA: Diagnosis not present

## 2016-08-20 DIAGNOSIS — D500 Iron deficiency anemia secondary to blood loss (chronic): Secondary | ICD-10-CM

## 2016-08-20 DIAGNOSIS — Z7982 Long term (current) use of aspirin: Secondary | ICD-10-CM | POA: Diagnosis not present

## 2016-08-20 DIAGNOSIS — I10 Essential (primary) hypertension: Secondary | ICD-10-CM | POA: Diagnosis not present

## 2016-08-20 NOTE — Assessment & Plan Note (Addendum)
#   Iron deficiency anemia-most likely secondary to AVMs of the colon [colonoscopy November 2016];last received IV venofer dec 2017 [pre-meds].   # today Hb 11.5/iron studies- N; continue PO iron no IV iron today.   #  Above plan was discussed with the patient and daughter in detail. They agree.  # Follow up in 4 months/labs prior/ possible venofer.

## 2016-08-20 NOTE — Progress Notes (Signed)
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  female is here for follow-up for her iron deficiency anemia; patient last iron was in December 2017.  Patient's energy levels are adequate. Patient denies any shortness of breath or chest pain. Appetite is good. No weight loss. No blood in stools. Admits to black stools . No nausea no vomiting. She is taking 2 iron pills a day. Denies any constipation.  ROS: A complete 10 point review of system is done which is negative except mentioned above in history of present illness  MEDICAL HISTORY:  Past Medical History:  Diagnosis Date  . Anemia   . Benign tumor   . Diabetes mellitus without complication ([place])   . Hyperlipidemia   . Hypertension   . Nausea   . Patient is [name]   . Thyroid disease     SURGICAL HISTORY: Past Surgical History:  Procedure Laterality Date  . COLONOSCOPY    . COLONOSCOPY WITH PROPOFOL N/A 03/13/2015   Procedure: COLONOSCOPY WITH PROPOFOL;  Surgeon: Manya Silvas, MD;  Location: [facility] ENDOSCOPY;  Service: Endoscopy;  Laterality: N/A;  . ESOPHAGOGASTRODUODENOSCOPY N/A 03/13/2015   Procedure: ESOPHAGOGASTRODUODENOSCOPY (EGD);  Surgeon: Manya Silvas, MD;  Location: [facility] ENDOSCOPY;  Service: Endoscopy;  Laterality: N/A;  . THYROIDECTOMY    . tumor removed      SOCIAL HISTORY: Social History   Social History  . Marital status: Widowed    Spouse name: N/A  . Number of children: N/A  . Years of education: N/A    Occupational History  . Not on file.   Social History Main Topics  . Smoking status: Never Smoker  . Smokeless tobacco: Never Used  . Alcohol use 0.6 oz/week    1 Glasses of wine per week     Comment: occ. wine  . Drug use: No  . Sexual activity: Not on file   Other Topics Concern  . Not on file   Social History Narrative  . No narrative on file    FAMILY HISTORY: Family History  Problem Relation Age of Onset  . Parkinson's disease Mother   . Heart disease Father   . Lupus Sister   . Aneurysm Son   . Lupus Son     ALLERGIES:  is allergic to ferumoxytol; other; and penicillins.  MEDICATIONS:  Current Outpatient Prescriptions  Medication Sig Dispense Refill  . acetaminophen (TYLENOL) 500 MG tablet Take 1,000 mg by mouth every 6 (six) hours as needed for moderate pain.    Marland Kitchen aspirin EC 81 MG tablet Take 1 tablet (81 mg total) by mouth daily. 100 tablet 12  . ferrous sulfate 325 (65 FE) MG tablet Take 1 tablet (325 mg total) by mouth 2 (two) times daily with a meal. 60 tablet 6  . lactobacillus acidophilus (BACID) TABS tablet Take 2 tablets by mouth 2 (two) times daily.    Marland Kitchen levocetirizine (XYZAL) 5 MG tablet Take 1 tablet (5 mg total) by mouth every evening. 30 tablet 12  . losartan (COZAAR) 100 MG tablet Take 1 tablet (100 mg  total) by mouth daily. 90 tablet 3  . metFORMIN (GLUCOPHAGE) 500 MG tablet Take 2 tablets (1,000 mg total) by mouth 2 (two) times daily with a meal. 360 tablet 1  . ranitidine (ZANTAC 75) 75 MG tablet Take 1 tablet (75 mg total) by mouth 2 (two) times daily. 60 tablet 12  . simvastatin (ZOCOR) 20 MG tablet Take 1 tablet (20 mg total) by mouth daily at 6 PM. 30 tablet 6  . loratadine (CLARITIN) 10 MG tablet Take 10 mg by mouth daily.     No current facility-administered medications for this visit.       Marland Kitchen  PHYSICAL EXAMINATION: ECOG PERFORMANCE STATUS: 1 - Symptomatic but completely ambulatory  Vitals:   08/20/16 1021  BP: (!) 150/78   Pulse: 81  Resp: 18  Temp: (!) 96.2 F (35.7 C)   Filed Weights   08/20/16 1021  Weight: 220 lb 0.3 oz (99.8 kg)    GENERAL: Well-nourished well-developed; Alert, no distress and comfortable. Walks with a cane. Obese. Accompanied by her daughter. EYES: no pallor or icterus OROPHARYNX: no thrush or ulceration; good dentition  NECK: supple, no masses felt LYMPH: no palpable lymphadenopathy in the cervical, axillary or inguinal regions LUNGS: clear to auscultation and No wheeze or crackles HEART/CVS: regular rate & rhythm and no murmurs; No lower extremity edema ABDOMEN: abdomen soft, non-tender and normal bowel sounds Musculoskeletal:no cyanosis of digits and no clubbing  PSYCH: alert & oriented x 3 with fluent speech NEURO: no focal motor/sensory deficits SKIN: no rashes or significant lesions LABORATORY DATA:  I have reviewed the data as listed Lab Results  Component Value Date   WBC 3.4 (L) 08/14/2016   HGB 11.5 (L) 08/14/2016   HCT 35.3 08/14/2016   MCV 92.9 08/14/2016   PLT 218 08/14/2016    Recent Labs  10/13/15 0923 06/03/16 0001 07/04/16 0001 08/14/16 1111  NA 138 140 141 137  K 4.4 4.1 3.9 3.9  CL 106 104 105 101  CO2 25 27 25 28   GLUCOSE 141* 128* 125* 128*  BUN 24* 17 16 21*  CREATININE 1.04* 0.84 0.93 0.91  CALCIUM 9.5 9.2 9.1 9.3  GFRNONAA 54* 71 62 >60  GFRAA >60 82 72 >60  PROT 8.9* 7.5 7.5  --   ALBUMIN 3.9 3.5* 3.6  --   AST 49* 32 50*  --   ALT 41 22 29  --   ALKPHOS 65 52 52  --   BILITOT 0.3 0.5 0.6  --     ASSESSMENT & PLAN:  Iron deficiency anemia due to chronic blood loss # Iron deficiency anemia-most likely secondary to AVMs of the colon [colonoscopy November 2016];last received IV venofer dec 2017 [pre-meds].   # today Hb 11.5/iron studies- N; continue PO iron no IV iron today.   #  Above plan was discussed with the patient and daughter in detail. They agree.  # Follow up in 4 months/labs prior/ possible venofer.        Cammie Sickle, MD 08/20/2016 11:21 AM

## 2016-08-28 ENCOUNTER — Telehealth: Payer: Self-pay | Admitting: Family Medicine

## 2016-08-28 ENCOUNTER — Encounter: Payer: Self-pay | Admitting: Family Medicine

## 2016-08-28 ENCOUNTER — Ambulatory Visit (INDEPENDENT_AMBULATORY_CARE_PROVIDER_SITE_OTHER): Payer: Medicare Other | Admitting: Family Medicine

## 2016-08-28 VITALS — BP 135/63 | HR 89 | Temp 98.4°F | Resp 16 | Ht 60.0 in | Wt 217.4 lb

## 2016-08-28 DIAGNOSIS — R42 Dizziness and giddiness: Secondary | ICD-10-CM | POA: Diagnosis not present

## 2016-08-28 DIAGNOSIS — I10 Essential (primary) hypertension: Secondary | ICD-10-CM | POA: Diagnosis not present

## 2016-08-28 DIAGNOSIS — E119 Type 2 diabetes mellitus without complications: Secondary | ICD-10-CM | POA: Diagnosis not present

## 2016-08-28 DIAGNOSIS — Z1231 Encounter for screening mammogram for malignant neoplasm of breast: Secondary | ICD-10-CM

## 2016-08-28 DIAGNOSIS — R55 Syncope and collapse: Secondary | ICD-10-CM | POA: Diagnosis not present

## 2016-08-28 DIAGNOSIS — Z1239 Encounter for other screening for malignant neoplasm of breast: Secondary | ICD-10-CM

## 2016-08-28 LAB — POCT GLYCOSYLATED HEMOGLOBIN (HGB A1C): Hemoglobin A1C: 7.2

## 2016-08-28 MED ORDER — GLUCOSE BLOOD VI STRP: ORAL_STRIP | 5 refills | Status: DC

## 2016-08-28 MED ORDER — ACCU-CHEK NANO SMARTVIEW W/DEVICE KIT: PACK | 0 refills | Status: DC

## 2016-08-28 NOTE — Assessment & Plan Note (Signed)
Stable BP, controlled on current regimen back on thiazide No known complications. Concern with recent postural dizziness episode vs meniere's  Plan: 1. Continue current BP med regimen - HCTZ 12.5mg , Losartan 100mg  2. Advised patient to follow-up with ENT, discuss HCTZ if needed for treating Meniere's, and then follow-up with Cardiology to review HCTZ, since improved BP back on it 3. Monitor BP at home regularly 4. Follow-up 3 months HTN

## 2016-08-28 NOTE — Assessment & Plan Note (Signed)
Stable, well controlled DM A1c 7.2 stable No known complication  Plan: 1. Continue current dose Metformin 1000mg  BID (500mg  tabs) 2. Encouraged continue improved DM diet and exercise swimming 3. Continue ARB, ASA, Statin 4. Follow-up 3 months DM A1c

## 2016-08-28 NOTE — Telephone Encounter (Signed)
Pt.called states that insurance will pay for ACCU Check, One Touch ......... Pt have requested the ACCU Check. Pt  Call back # is  [phone]

## 2016-08-28 NOTE — Assessment & Plan Note (Signed)
Resolved episode, without recurrence Followed by Neuro, Cards, ENT - prior work-up negative, now suggestive of possible Meniere's

## 2016-08-28 NOTE — Progress Notes (Signed)
Subjective:    Patient ID: Diana Harmon, female    DOB: [birth year], 71 y.o.   MRN: [id number]  Diana Harmon is a 71 y.o. female presenting on 08/28/2016 for Diabetes (Patient here today to fu last A1c was checked on 05/16/16 was 7.2%. ) and Hypertension (Patient reports Dr. Farrel Conners (cardiologist) had stopped HCTZ. Patient reports that she restarted mediction this week due to BP being elevated.)   HPI   CHRONIC DM, Type 2: Reports no concerns, A1c 7.2, unchanged CBGs: Avg 110-130, Low none. Checks CBGs few times weekly Meds: Metformin 500mg  x 2 tabs 1000mg  BID Reports good compliance. Tolerating well w/o side-effects Currently on ARB, ASA 81, Simvastatin 20mg  Lifestyle: Diet (low carb diet tries to follow DM diet) / Exercise (limited but trying water aerobics now in pool) Denies hypoglycemia, polyuria, visual changes, numbness or tingling.  CHRONIC HTN / History of Postural Dizziness Episode: Reviews recent history over past 2 months, had single episode of dizziness/lightheadedness, followed up with PCP, had concerns for possible TIA, referred to Neurology, had testing with EEG / MRI unremarkable, then referral to ENT, had comprehensive vertigo / inner ear testing. Thought to be possible Meniere's disease, scheduled to follow back up with them soon. - Today reports she has not had any recurrent dizziness episodes since February - Regarding medications, per Cardiology recently taken off of HCTZ on 08/15/16 due to concerns of aortic valve stenosis, off the med had several readings with SBP >140, up to 170, then she resumed HCTZ 12.5mg  over past weekend and did not notify Cardiology yet, now BP is improved, attributes elevated BP to higher sodium foods recently Current Meds - HCTZ 12.5mg  daily, Losartan 100mg  daily   Reports good compliance, took meds today. Tolerating well, w/o complaints. - Trying to follow low sodium diet, some exercise with swimming Denies CP, dyspnea,  HA, edema, dizziness / lightheadedness  Health Maintenance: - Prior history of parathyroid gland, she s/p parathyroid surgery, interested in DEXA scanning in future - Last mammogram in 2016, reported negative, no immediate family history of breast cancer, did have great aunt with breast cancer. Interested in screening mammogram  Social History  Substance Use Topics  . Smoking status: Never Smoker  . Smokeless tobacco: Never Used  . Alcohol use 0.6 oz/week    1 Glasses of wine per week     Comment: occ. wine    Review of Systems Per HPI unless specifically indicated above     Objective:    BP 135/63 (BP Location: Left Arm, Patient Position: Sitting, Cuff Size: Large)   Pulse 89   Temp 98.4 F (36.9 C) (Oral)   Resp 16   Ht 5' (1.524 m)   Wt 217 lb 6.4 oz (98.6 kg)   SpO2 100%   BMI 42.46 kg/m   Wt Readings from Last 3 Encounters:  08/28/16 217 lb 6.4 oz (98.6 kg)  08/20/16 220 lb 0.3 oz (99.8 kg)  08/15/16 217 lb 8 oz (98.7 kg)    Physical Exam  Constitutional: She is oriented to person, place, and time. She appears well-developed and well-nourished. No distress.  Well-appearing, comfortable, cooperative  HENT:  Head: Normocephalic and atraumatic.  Mouth/Throat: Oropharynx is clear and moist.  Eyes: Conjunctivae are normal.  Neck: Normal range of motion. Neck supple. No thyromegaly present.  Cardiovascular: Normal rate, regular rhythm and intact distal pulses.   Murmur (2/6 systolic murmur over R sternal border, some radiation to carotids appreciated) heard. Pulmonary/Chest: Effort normal and breath  sounds normal. No respiratory distress. She has no wheezes. She has no rales.  Musculoskeletal: She exhibits no edema.  Lymphadenopathy:    She has no cervical adenopathy.  Neurological: She is alert and oriented to person, place, and time.  Skin: Skin is warm and dry. No rash noted. She is not diaphoretic. No erythema.  Psychiatric: She has a normal mood and affect. Her  behavior is normal.  Nursing note and vitals reviewed.    Results for orders placed or performed in visit on 08/28/16  POCT glycosylated hemoglobin (Hb A1C)  Result Value Ref Range   Hemoglobin A1C 7.2       Assessment & Plan:   Problem List Items Addressed This Visit    Postural dizziness with near syncope    Resolved episode, without recurrence Followed by Neuro, Cards, ENT - prior work-up negative, now suggestive of possible Meniere's      Relevant Medications   hydrochlorothiazide (HYDRODIURIL) 12.5 MG tablet   Essential (primary) hypertension    Stable BP, controlled on current regimen back on thiazide No known complications. Concern with recent postural dizziness episode vs meniere's  Plan: 1. Continue current BP med regimen - HCTZ 12.5mg , Losartan 100mg  2. Advised patient to follow-up with ENT, discuss HCTZ if needed for treating Meniere's, and then follow-up with Cardiology to review HCTZ, since improved BP back on it 3. Monitor BP at home regularly 4. Follow-up 3 months HTN      Relevant Medications   hydrochlorothiazide (HYDRODIURIL) 12.5 MG tablet   Controlled diabetes mellitus type II without complication (HCC) - Primary    Stable, well controlled DM A1c 7.2 stable No known complication  Plan: 1. Continue current dose Metformin 1000mg  BID (500mg  tabs) 2. Encouraged continue improved DM diet and exercise swimming 3. Continue ARB, ASA, Statin 4. Follow-up 3 months DM A1c      Relevant Orders   POCT glycosylated hemoglobin (Hb A1C) (Completed)    Other Visit Diagnoses    Breast cancer screening       Ordered screening mammo for [facility], pt to schedule   Relevant Orders   MM SCREENING BREAST TOMO BILATERAL      Meds ordered this encounter  Medications  . hydrochlorothiazide (HYDRODIURIL) 12.5 MG tablet    Sig: Take 12.5 mg by mouth daily.    Follow up plan: Return in about 3 months (around 11/27/2016) for diabetes, blood  pressure.  [name], [facility] 08/28/2016, 10:33 PM

## 2016-08-28 NOTE — Patient Instructions (Signed)
Thank you for coming in to clinic today.  1. BP is improved back on HCTZ - Try to stick to low sodium diet - Stay active with swimming - Follow-up with ENT Dr Richardson Landry and discuss HCTZ, looks like may have Meniere's Disease which is inner ear problem, and the treatment is Diuretic (or HCTZ), after discussion about this med and treatment, please contact Dr Yvone Neu to review this to determine your med regimen  No changes today  2. Diabetes is well controlled A1c 7.2, continue Metformin  3. [facility] [place], [place] [address] Phone: [phone] Call anytime to schedule now that order has been placed.  4. You had a DEXA bone density scan on 05/18/14 results were Normal. Without osteoporosis. Can repeat every 2 years according to Medicare or can do every 5 to 10 years if prefer - May take daily Calcium Vitamin D supplement  5. Call our office after find out from Medicare which glucometer brand specifically you need, we will send in with test strips  Please schedule a follow-up appointment with Dr. Parks Ranger in 3 months for Diabetes A1c, HTN  If you have any other questions or concerns, please feel free to call the clinic or send a message through [place]. You may also schedule an earlier appointment if necessary.  Diana Putnam, DO [place]

## 2016-08-28 NOTE — Telephone Encounter (Signed)
Rx written for Accu-Chek Nano Smartview glucometer, 1 kit, and test strips, #100, +5 refills 90 day supply, check blood glucose up to once daily as instructed.  Sent to [place] [place] 08/28/2016, 5:27 PM

## 2016-08-29 ENCOUNTER — Telehealth: Payer: Self-pay | Admitting: Cardiology

## 2016-08-29 DIAGNOSIS — H8109 Meniere's disease, unspecified ear: Secondary | ICD-10-CM | POA: Diagnosis not present

## 2016-08-29 NOTE — Telephone Encounter (Signed)
Spoke with patient and made her aware that Dr. Yvone Neu spoke with Dr. Richardson Landry. Instructed her to monitor blood pressures and to call if they remain lower than usual because we may need to make some medication adjustments. She verbalized understanding of our conversation, agreement with plan, and had no further questions at this time.

## 2016-08-29 NOTE — Telephone Encounter (Signed)
Spoke with Dr. Richardson Landry (ENT): pt will be on diuretic Maxzide for Meniere's disease.  We shd advise pt to monitor BP with this and if BP is much lower than her usual, in 90s- 110s, we may have to downtitrate Losartan.

## 2016-09-05 ENCOUNTER — Telehealth: Payer: Self-pay | Admitting: Cardiology

## 2016-09-05 DIAGNOSIS — I10 Essential (primary) hypertension: Secondary | ICD-10-CM

## 2016-09-05 NOTE — Telephone Encounter (Signed)
The medication is for her Meniere's (ENT). Need to check with ENT as to frequency. BP looks good. Continue to monitor.  We can check BMP for her or through PCP.

## 2016-09-05 NOTE — Telephone Encounter (Signed)
bp log  Using digital cuff   Date:  AM BP  PM BP  08/29/16   143/78    08/30/16 138/76  136/74  08/31/16 160/89  156/83   09/01/16 152/79  144/77  09/02/16 146/77  135/78   Started taking new bp medication   09/03/16   129/66    Took medication  09/04/16  148/82  134/75  Took medicaton  09/05/16  126/79    Has not taken medication today   Patient c/o cramping in legs and feet during the night not sure if she should take medication everyday

## 2016-09-05 NOTE — Telephone Encounter (Signed)
Spoke with patient and reviewed Dr. Tora Kindred recommendations to continue monitoring blood pressure and have repeat labs. She verbalized agreement with plan and instructed her to go to [facility] of the hospital when possible to get those checked. She had no further questions at this time. She was appreciative for the call and let her know that I would call her back with results.

## 2016-09-05 NOTE — Telephone Encounter (Signed)
Patient called back with blood pressure medications maxzide 5/25 mg once daily that's for the ear problem. She stated that Dr. Yvone Neu wanted her to monitor her blood pressures and she kept a list of those. See prior message. Let her know that I would forward these to Dr. Yvone Neu for her review and recommendations. She verbalized understanding and had no further questions at this time.

## 2016-09-06 ENCOUNTER — Other Ambulatory Visit
Admission: RE | Admit: 2016-09-06 | Discharge: 2016-09-06 | Disposition: A | Discharge status: Home / Self Care | Payer: Medicare Other | Source: Ambulatory Visit | Attending: Cardiology | Admitting: Cardiology

## 2016-09-06 DIAGNOSIS — I10 Essential (primary) hypertension: Secondary | ICD-10-CM | POA: Diagnosis present

## 2016-09-06 LAB — BASIC METABOLIC PANEL
Anion gap: 8 (ref 5–15)
BUN: 43 mg/dL — AB (ref 6–20)
CHLORIDE: 107 mmol/L (ref 101–111)
CO2: 24 mmol/L (ref 22–32)
CREATININE: 1.3 mg/dL — AB (ref 0.44–1.00)
Calcium: 9.7 mg/dL (ref 8.9–10.3)
GFR calc Af Amer: 47 mL/min — ABNORMAL LOW (ref >60)
GFR calc non Af Amer: 41 mL/min — ABNORMAL LOW (ref >60)
GLUCOSE: 139 mg/dL — AB (ref 65–99)
Potassium: 4.4 mmol/L (ref 3.5–5.1)
SODIUM: 139 mmol/L (ref 135–145)

## 2016-09-09 ENCOUNTER — Other Ambulatory Visit: Payer: Self-pay | Admitting: Family Medicine

## 2016-09-09 DIAGNOSIS — N179 Acute kidney failure, unspecified: Secondary | ICD-10-CM

## 2016-09-16 ENCOUNTER — Other Ambulatory Visit: Payer: Medicare Other

## 2016-09-17 ENCOUNTER — Telehealth: Payer: Self-pay | Admitting: Family Medicine

## 2016-09-17 LAB — BASIC METABOLIC PANEL WITH GFR
BUN: 30 mg/dL — ABNORMAL HIGH (ref 7–25)
CO2: 26 mmol/L (ref 20–31)
Calcium: 9.4 mg/dL (ref 8.6–10.4)
Chloride: 107 mmol/L (ref 98–110)
Creat: 1.15 mg/dL — ABNORMAL HIGH (ref 0.60–0.93)
GFR, EST NON AFRICAN AMERICAN: 48 mL/min — AB (ref >=60)
GFR, Est African American: 56 mL/min — ABNORMAL LOW (ref >=60)
Glucose, Bld: 131 mg/dL — ABNORMAL HIGH (ref 65–99)
POTASSIUM: 4.5 mmol/L (ref 3.5–5.3)
Sodium: 141 mmol/L (ref 135–146)

## 2016-09-17 NOTE — Telephone Encounter (Signed)
Called patient back today 5/15 1:05pm, adjusted earlier recommendation and advised patient to REDUCE dose of Losartan from 100 to 50mg  (cut tabs in HALF) starting in 1 week. Also she is to schedule a non fasting LAB ONLY apt in 3 weeks at our office, and office visit with me in 4 weeks from now to review BP and lab results.  [name], [facility] 09/17/2016, 1:08 PM

## 2016-10-01 ENCOUNTER — Other Ambulatory Visit: Payer: Self-pay

## 2016-10-01 ENCOUNTER — Other Ambulatory Visit: Payer: Medicare Other

## 2016-10-01 DIAGNOSIS — I10 Essential (primary) hypertension: Secondary | ICD-10-CM

## 2016-10-01 DIAGNOSIS — D500 Iron deficiency anemia secondary to blood loss (chronic): Secondary | ICD-10-CM

## 2016-10-02 LAB — BASIC METABOLIC PANEL
BUN: 32 mg/dL — ABNORMAL HIGH (ref 7–25)
CALCIUM: 9.7 mg/dL (ref 8.6–10.4)
CO2: 24 mmol/L (ref 20–31)
Chloride: 107 mmol/L (ref 98–110)
Creat: 1.07 mg/dL — ABNORMAL HIGH (ref 0.60–0.93)
GLUCOSE: 114 mg/dL — AB (ref 65–99)
Potassium: 4.4 mmol/L (ref 3.5–5.3)
SODIUM: 142 mmol/L (ref 135–146)

## 2016-10-08 ENCOUNTER — Ambulatory Visit (INDEPENDENT_AMBULATORY_CARE_PROVIDER_SITE_OTHER): Payer: Medicare Other | Admitting: Family Medicine

## 2016-10-08 ENCOUNTER — Encounter: Payer: Self-pay | Admitting: Family Medicine

## 2016-10-08 VITALS — BP 138/72 | HR 82 | Temp 98.7°F | Resp 16 | Ht 60.0 in | Wt 214.0 lb

## 2016-10-08 DIAGNOSIS — G8929 Other chronic pain: Secondary | ICD-10-CM

## 2016-10-08 DIAGNOSIS — M170 Bilateral primary osteoarthritis of knee: Secondary | ICD-10-CM

## 2016-10-08 DIAGNOSIS — M159 Polyosteoarthritis, unspecified: Secondary | ICD-10-CM

## 2016-10-08 DIAGNOSIS — I10 Essential (primary) hypertension: Secondary | ICD-10-CM

## 2016-10-08 DIAGNOSIS — E559 Vitamin D deficiency, unspecified: Secondary | ICD-10-CM | POA: Insufficient documentation

## 2016-10-08 DIAGNOSIS — M150 Primary generalized (osteo)arthritis: Secondary | ICD-10-CM | POA: Diagnosis not present

## 2016-10-08 DIAGNOSIS — M25562 Pain in left knee: Secondary | ICD-10-CM

## 2016-10-08 DIAGNOSIS — M25561 Pain in right knee: Secondary | ICD-10-CM

## 2016-10-08 DIAGNOSIS — N179 Acute kidney failure, unspecified: Secondary | ICD-10-CM | POA: Diagnosis not present

## 2016-10-08 MED ORDER — DICLOFENAC SODIUM 1 % TD GEL: 2.0000 g | Freq: Three times a day (TID) | TRANSDERMAL | 5 refills | Status: DC | PRN

## 2016-10-08 MED ORDER — LOSARTAN POTASSIUM 50 MG PO TABS: 50.0000 mg | ORAL_TABLET | Freq: Every day | ORAL | 3 refills | Status: DC

## 2016-10-08 NOTE — Assessment & Plan Note (Signed)
Stable BP, controlled by home readings, mildly elevated today but improved Complication with Aortic Stenosis, and diagnosis of Meniere's recently by ENT  Plan: 1. Reduce Losartan dose to 50mg  daily - sent new rx for 50mg  tabs 2. Continue Triamterene-HCTZ 37.5mg  - 25mg  daily per Cards/ENT 3. Monitor BP at home regularly 4. Follow-up 3 months HTN

## 2016-10-08 NOTE — Patient Instructions (Addendum)
Thank you for coming to the clinic today.  1. BP is doing better overall - Today improved on re-check - I trust the home readings you are providing, I think we are on the right track.  Sent new rx today Losartan 50mg  tablets, once daily - (you can finish your half tablets for now)  NO other med changes  2. For Knee Pain / Arthritis  Sent new rx Diclofenac Gel (same medicine) use topically on affected knee up to 3 times a day as needed - Will need to get prior authorization first to get this covered  Will complete paperwork for this and since you cannot take any NSAIDs (aleve, naproxen)  Recommend to start taking Tylenol Extra Strength 500mg  tabs - take 1 to 2 tabs per dose (max 1000mg ) every 6-8 hours for pain (take regularly, don't skip a dose for next 7 days), max 24 hour daily dose is 6 tablets or 3000mg . In the future you can repeat the same everyday Tylenol course for 1-2 weeks at a time.  Please schedule a Follow-up Appointment to: Return in about 3 months (around 01/08/2017) for blood pressure, arthritis knee pain, diabetes.  If you have any other questions or concerns, please feel free to call the clinic or send a message through [facility]. You may also schedule an earlier appointment if necessary.  [name], DO [place]

## 2016-10-08 NOTE — Assessment & Plan Note (Signed)
Stable chronic R > L generalized Knee pain with history of swelling without known injury or trauma  - Known knee OA/DJD - Able to bear weight, no knee instability, mechanical locking - No prior history of knee surgery, arthroscopy - Mostly responding to conservative therapy and Tylenol. Contraindicated NSAIDs due to PUD (bleeding ulcer in past, failed Naproxen, Meloxicam) - Last imaging bilateral knee X-rays 2012 with mild to moderate DJD, medial>lateral DJD  Plan: 1. Start new rx Diclofenac topical TID PRN for knees - will need PA, contraindicated NSAIDs due to PUD and GI bleed 2. Continue maximize Tylenol 500-1000mg  per dose TID PRN breakthrough 3. Future consider muscle relaxant with Baclofen PRN if needed 4. RICE therapy (rest, ice, compression, elevation) for swelling, activity modification 5. Defer repeat knee x-rays for now, last done in 2012 6. Follow-up as needed - consider steroid knee joint injection if needed

## 2016-10-08 NOTE — Progress Notes (Signed)
Subjective:    Patient ID: Diana Harmon, female    DOB: [birth year], 71 y.o.   MRN: [id number]  Diana Harmon is a 71 y.o. female presenting on 10/08/2016 for Hypertension   HPI   FOLLOW-UP CHRONIC HTN / AKI Elevated Creatinine - Last office visit with me 08/28/16, in interval she has had several chemistry labs drawn to monitor kidney function with concern of recent dx Acute Kidney Injury with elevated Creatinine, thought due to combination of factors including ARB, Thiazide. She was recently started on thiazide due to Meniere's disease by ENT (followed by Dr Richardson Landry) - interval results with now improving Cr trend towards baseline, has not been back to see Dr Yvone Neu since, she was restarted on half dose Losartan to 50mg  (instead of 100mg  tabs) about 1-2 weeks ago - Today reports her BP has improved overall, seems to be stable on half dose Losartan, home record BP ranging 120-130s, occasional SBP 140, DBP is well controlled 60-70s - Admits recently some lower extremity ankle swelling, no significant dietary changes or sodium intake Current Meds - Triameterene-HCTZ 37.5-25mg  daily, Losartan 50mg  daily (half tab of 100 - needs new rx today for 50mg  tabs)   Reports good compliance, took meds today. Tolerating well, w/o complaints. - Trying to follow low sodium diet, some exercise with swimming Denies CP, dyspnea, HA, edema, dizziness / lightheadedness  History of Vitamin D Deficiency - She was taking OTC Vitamin D3 supplement, 5,000 iu daily, asking how long she should take this for, then was told to take Vitamin D3 2,000 daily for maintenance - No recent Vitamin D level available for review  Chronic Bilateral Kneel pain (R>L) / History of Osteoarthritis multiple joints (knees, back, hands) - Reports chronic known symptoms with intermittent flares of joint pains. No significant recent problem, except weeks to months ago had R>L knee flare of pain, described aching, intermittent  pain worse with excess activity, no pain radiating down legs. - Taking some tylenol but cannot take NSAIDs due to contraindicated (History of PUD with GI Bleed, failed Naproxen, meloxicam in past), she has tried topical Voltaren gel from family member and requesting to have her own rx for this, since it was helping - Denies any fevers/chills, numbness, tingling, weakness, loss of control bladder/bowel incontinence or retention, unintentional wt loss, night sweats  Social History  Substance Use Topics  . Smoking status: Never Smoker  . Smokeless tobacco: Never Used  . Alcohol use 0.6 oz/week    1 Glasses of wine per week     Comment: occ. wine    Review of Systems Per HPI unless specifically indicated above     Objective:    BP 138/72 (BP Location: Left Arm, Cuff Size: Large)   Pulse 82   Temp 98.7 F (37.1 C) (Oral)   Resp 16   Ht 5' (1.524 m)   Wt 214 lb (97.1 kg)   BMI 41.79 kg/m   Wt Readings from Last 3 Encounters:  10/08/16 214 lb (97.1 kg)  08/28/16 217 lb 6.4 oz (98.6 kg)  08/20/16 220 lb 0.3 oz (99.8 kg)    Physical Exam  Constitutional: She is oriented to person, place, and time. She appears well-developed and well-nourished. No distress.  Well-appearing, comfortable, cooperative  HENT:  Head: Normocephalic and atraumatic.  Mouth/Throat: Oropharynx is clear and moist.  Eyes: Conjunctivae are normal. Right eye exhibits no discharge. Left eye exhibits no discharge.  Neck: Normal range of motion. Neck supple.  Cardiovascular: Normal rate,  regular rhythm and intact distal pulses.   Murmur (2/6 systolic murmur over R sternal border, some radiation to carotids appreciated) heard. Pulmonary/Chest: Effort normal and breath sounds normal. No respiratory distress. She has no wheezes. She has no rales.  Musculoskeletal: She exhibits edema (mild bilateral lower extremity ankle/foot edema, improved today, trace only).  Bilateral Knees Inspection: Mild bulky appearance and  symmetrical. No ecchymosis or effusion. Palpation: Non-tender joint lines. Mild crepitus ROM: Mostly full active ROM bilaterally Strength: 5/5 intact knee flex/ext, ankle dorsi/plantarflex Neurovascular: distally intact sensation light touch and pulses  Neurological: She is alert and oriented to person, place, and time.  Skin: Skin is warm and dry. No rash noted. She is not diaphoretic. No erythema.  Psychiatric: She has a normal mood and affect. Her behavior is normal.  Well groomed, good eye contact, normal speech and thoughts  Nursing note and vitals reviewed.  Results for orders placed or performed in visit on 09/62/83  Basic Metabolic Panel (BMET)  Result Value Ref Range   Sodium 142 135 - 146 mmol/L   Potassium 4.4 3.5 - 5.3 mmol/L   Chloride 107 98 - 110 mmol/L   CO2 24 20 - 31 mmol/L   Glucose, Bld 114 (H) 65 - 99 mg/dL   BUN 32 (H) 7 - 25 mg/dL   Creat 1.07 (H) 0.60 - 0.93 mg/dL   Calcium 9.7 8.6 - 10.4 mg/dL      Assessment & Plan:   Problem List Items Addressed This Visit    Vitamin D deficiency    History of vitamin D deficiency No recent lab value May take Vitamin D3 5,000 unit daily for 3 months then reduce to maintenance dose Vitamin D3 2,000 daily Follow-up will re-check with labs in future      Osteoarthritis of both knees    Likely underlying etiology for chronic knee pain, also with obesity BMI >41 causing inc stress on knees - See A&P under chronic knee pain      Relevant Medications   diclofenac sodium (VOLTAREN) 1 % GEL   Essential (primary) hypertension - Primary    Stable BP, controlled by home readings, mildly elevated today but improved Complication with Aortic Stenosis, and diagnosis of Meniere's recently by ENT  Plan: 1. Reduce Losartan dose to 50mg  daily - sent new rx for 50mg  tabs 2. Continue Triamterene-HCTZ 37.5mg  - 25mg  daily per Cards/ENT 3. Monitor BP at home regularly 4. Follow-up 3 months HTN      Relevant Medications    triamterene-hydrochlorothiazide (MAXZIDE-25) 37.5-25 MG tablet   losartan (COZAAR) 50 MG tablet   Chronic pain of both knees    Stable chronic R > L generalized Knee pain with history of swelling without known injury or trauma  - Known knee OA/DJD - Able to bear weight, no knee instability, mechanical locking - No prior history of knee surgery, arthroscopy - Mostly responding to conservative therapy and Tylenol. Contraindicated NSAIDs due to PUD (bleeding ulcer in past, failed Naproxen, Meloxicam) - Last imaging bilateral knee X-rays 2012 with mild to moderate DJD, medial>lateral DJD  Plan: 1. Start new rx Diclofenac topical TID PRN for knees - will need PA, contraindicated NSAIDs due to PUD and GI bleed 2. Continue maximize Tylenol 500-1000mg  per dose TID PRN breakthrough 3. Future consider muscle relaxant with Baclofen PRN if needed 4. RICE therapy (rest, ice, compression, elevation) for swelling, activity modification 5. Defer repeat knee x-rays for now, last done in 2012 6. Follow-up as needed - consider steroid  knee joint injection if needed      Relevant Medications   diclofenac sodium (VOLTAREN) 1 % GEL   Arthritis, degenerative    Likely etiology for chronic joint pain in knees and back, secondary to DJD OA New rx Voltaren/Diclofenac topical for knees Encourage regular Tylenol use See A&P       Other Visit Diagnoses    AKI (acute kidney injury) ([place])       Resolving as of last chemistry 5/29 with improving Cr trend towards baseline. Now on half dose ARB, thiazide. No NSAIDs, follow-up as planned      Meds ordered this encounter  Medications  .   . cholecalciferol (VITAMIN D) 1000 units tablet    Sig: Take 5,000 Units by mouth daily.  . diclofenac sodium (VOLTAREN) 1 % GEL    Sig: Apply 2 g topically 3 (three) times daily as needed. On affected knee joints    Dispense:  100 g    Refill:  5  . losartan (COZAAR) 50 MG tablet    Sig: Take 1 tablet (50 mg total) by  mouth daily.    Dispense:  90 tablet    Refill:  3      Follow up plan: Return in about 3 months (around 01/08/2017) for blood pressure, arthritis knee pain, diabetes.  [name], [facility] 10/08/2016, 1:43 PM

## 2016-10-08 NOTE — Assessment & Plan Note (Signed)
History of vitamin D deficiency No recent lab value May take Vitamin D3 5,000 unit daily for 3 months then reduce to maintenance dose Vitamin D3 2,000 daily Follow-up will re-check with labs in future

## 2016-10-08 NOTE — Assessment & Plan Note (Signed)
Likely etiology for chronic joint pain in knees and back, secondary to DJD OA New rx Voltaren/Diclofenac topical for knees Encourage regular Tylenol use See A&P

## 2016-10-08 NOTE — Assessment & Plan Note (Signed)
Likely underlying etiology for chronic knee pain, also with obesity BMI >41 causing inc stress on knees - See A&P under chronic knee pain

## 2016-10-21 ENCOUNTER — Telehealth: Payer: Self-pay | Admitting: Cardiology

## 2016-10-21 MED ORDER — HYDROCHLOROTHIAZIDE 12.5 MG PO TABS: 12.5000 mg | ORAL_TABLET | Freq: Every day | ORAL | 6 refills | Status: DC

## 2016-10-21 NOTE — Telephone Encounter (Signed)
Pt called, asks if she should be taking HCTZ with her heart condition. Please call. She states Dr. Yvone Neu was supposed to be talking with Dr. Richardson Landry, at [facility]. Please call.

## 2016-10-21 NOTE — Telephone Encounter (Signed)
Spoke w/ pt.  Advised her of Dr. Gollan's recommendation.  She is appreciative and will call back w/ any further questions or concerns.  

## 2016-10-21 NOTE — Telephone Encounter (Signed)
Review of her lab work showed kidney dysfunction through most of May likely secondary to mild dehydration HCTZ is a diuretic and may have been stopped secondary to her renal dysfunction Certainly she could restart the HCTZ but we need to drink more fluids to avoid hypovolemia

## 2016-10-21 NOTE — Telephone Encounter (Signed)
Spoke w/ pt.  She reports that she has Meniere's disease and she has balance problems due to fluid in her ears. Dr. Richardson Landry prescribed her Maxzide, but this was discouraged 2/2 heart murmur. Pt previously took HCTZ for years but Dr. Yvone Neu d/c'd this at last ov. She states that she was told that Dr. Yvone Neu was going to speak w/ Dr. Richardson Landry regarding this and get back to her, but she has not heard back.  Pt is shocked to hear that Dr. Yvone Neu is no longer in the office.  Pt reports that her BP has been good, she has increased her exercise and reduced her sodium intake, but she still has intermittent ankle swelling, esp in the hot weather.  She denies SOB. She currently does not take a fluid pill, but would like to resume HCTZ if she can. Advised her that I will make Dr. Rockey Situ aware of her concerns and call her back w/ his recommendation.

## 2016-12-19 ENCOUNTER — Other Ambulatory Visit: Payer: Self-pay | Admitting: *Deleted

## 2016-12-19 ENCOUNTER — Telehealth: Payer: Self-pay | Admitting: Family Medicine

## 2016-12-19 ENCOUNTER — Inpatient Hospital Stay: Payer: Medicare Other | Attending: Internal Medicine

## 2016-12-19 DIAGNOSIS — Z7984 Long term (current) use of oral hypoglycemic drugs: Secondary | ICD-10-CM | POA: Diagnosis not present

## 2016-12-19 DIAGNOSIS — E785 Hyperlipidemia, unspecified: Secondary | ICD-10-CM | POA: Insufficient documentation

## 2016-12-19 DIAGNOSIS — E079 Disorder of thyroid, unspecified: Secondary | ICD-10-CM | POA: Insufficient documentation

## 2016-12-19 DIAGNOSIS — I10 Essential (primary) hypertension: Secondary | ICD-10-CM | POA: Insufficient documentation

## 2016-12-19 DIAGNOSIS — Q2733 Arteriovenous malformation of digestive system vessel: Secondary | ICD-10-CM | POA: Insufficient documentation

## 2016-12-19 DIAGNOSIS — Z79899 Other long term (current) drug therapy: Secondary | ICD-10-CM | POA: Diagnosis not present

## 2016-12-19 DIAGNOSIS — E119 Type 2 diabetes mellitus without complications: Secondary | ICD-10-CM | POA: Insufficient documentation

## 2016-12-19 DIAGNOSIS — D500 Iron deficiency anemia secondary to blood loss (chronic): Secondary | ICD-10-CM

## 2016-12-19 DIAGNOSIS — D509 Iron deficiency anemia, unspecified: Secondary | ICD-10-CM | POA: Diagnosis not present

## 2016-12-19 DIAGNOSIS — Z7982 Long term (current) use of aspirin: Secondary | ICD-10-CM | POA: Insufficient documentation

## 2016-12-19 LAB — CBC WITH DIFFERENTIAL/PLATELET
BASOS ABS: 0 10*3/uL (ref 0–0.1)
Basophils Relative: 1 %
Eosinophils Absolute: 0.3 10*3/uL (ref 0–0.7)
Eosinophils Relative: 7 %
HEMATOCRIT: 34.4 % — AB (ref 35.0–47.0)
HEMOGLOBIN: 11 g/dL — AB (ref 12.0–16.0)
LYMPHS PCT: 48 %
Lymphs Abs: 1.8 10*3/uL (ref 1.0–3.6)
MCH: 29.8 pg (ref 26.0–34.0)
MCHC: 31.9 g/dL — ABNORMAL LOW (ref 32.0–36.0)
MCV: 93.6 fL (ref 80.0–100.0)
Monocytes Absolute: 0.5 10*3/uL (ref 0.2–0.9)
Monocytes Relative: 13 %
NEUTROS ABS: 1.2 10*3/uL — AB (ref 1.4–6.5)
Neutrophils Relative %: 31 %
Platelets: 236 10*3/uL (ref 150–440)
RBC: 3.68 MIL/uL — AB (ref 3.80–5.20)
RDW: 13.5 % (ref 11.5–14.5)
WBC: 3.7 10*3/uL (ref 3.6–11.0)

## 2016-12-19 LAB — IRON AND TIBC
Iron: 75 ug/dL (ref 28–170)
SATURATION RATIOS: 24 % (ref 10.4–31.8)
TIBC: 309 ug/dL (ref 250–450)
UIBC: 234 ug/dL

## 2016-12-19 LAB — FERRITIN: Ferritin: 332 ng/mL — ABNORMAL HIGH (ref 11–307)

## 2016-12-19 NOTE — Telephone Encounter (Signed)
Pt. Called requesting a refll on her metformin

## 2016-12-24 ENCOUNTER — Inpatient Hospital Stay (HOSPITAL_BASED_OUTPATIENT_CLINIC_OR_DEPARTMENT_OTHER): Payer: Medicare Other | Admitting: Internal Medicine

## 2016-12-24 ENCOUNTER — Telehealth: Payer: Self-pay | Admitting: Family Medicine

## 2016-12-24 ENCOUNTER — Inpatient Hospital Stay: Payer: Medicare Other

## 2016-12-24 ENCOUNTER — Other Ambulatory Visit: Payer: Self-pay

## 2016-12-24 VITALS — BP 124/78 | HR 90 | Temp 97.6°F | Resp 20

## 2016-12-24 DIAGNOSIS — D509 Iron deficiency anemia, unspecified: Secondary | ICD-10-CM | POA: Diagnosis not present

## 2016-12-24 DIAGNOSIS — E119 Type 2 diabetes mellitus without complications: Secondary | ICD-10-CM

## 2016-12-24 DIAGNOSIS — Z7982 Long term (current) use of aspirin: Secondary | ICD-10-CM

## 2016-12-24 DIAGNOSIS — E079 Disorder of thyroid, unspecified: Secondary | ICD-10-CM

## 2016-12-24 DIAGNOSIS — E785 Hyperlipidemia, unspecified: Secondary | ICD-10-CM | POA: Diagnosis not present

## 2016-12-24 DIAGNOSIS — Q2733 Arteriovenous malformation of digestive system vessel: Secondary | ICD-10-CM

## 2016-12-24 DIAGNOSIS — Z7984 Long term (current) use of oral hypoglycemic drugs: Secondary | ICD-10-CM

## 2016-12-24 DIAGNOSIS — I10 Essential (primary) hypertension: Secondary | ICD-10-CM

## 2016-12-24 DIAGNOSIS — Z79899 Other long term (current) drug therapy: Secondary | ICD-10-CM

## 2016-12-24 DIAGNOSIS — D500 Iron deficiency anemia secondary to blood loss (chronic): Secondary | ICD-10-CM | POA: Diagnosis not present

## 2016-12-24 MED ORDER — METFORMIN HCL 500 MG PO TABS: 1000.0000 mg | ORAL_TABLET | Freq: Two times a day (BID) | ORAL | 3 refills | Status: DC

## 2016-12-24 NOTE — Telephone Encounter (Signed)
Do not believe that I received the previous request. Apologize for any inconvenience.  Metformin has been refilled today.  [name], [facility] 12/24/2016, 4:56 PM

## 2016-12-24 NOTE — Telephone Encounter (Signed)
Pt called last week with refill request on metformin and hasn't heard anything back [phone]

## 2016-12-24 NOTE — Progress Notes (Signed)
[place] NOTE  Patient Care Team: [name], DO as PCP - General (Family Medicine)  CHIEF COMPLAINTS/PURPOSE OF CONSULTATION:   # AUG 2016- IRON DEFICIENCY ANEMIA- Possible AVMs of colon [s/p Argon laser; Dr.Elliot;EGD-neg Nov 2016] s/p IV Ferriheme x2 [[id number]; ALLERGY]; Venofer IV   # Rectal well diff carcinoid [1.2CM; s/p polypectomy 2011]; Allergy- [facility] Bouchard Philomena Course 2016]- neck/face swelling [improved with benadryl]  HISTORY OF PRESENTING ILLNESS:  Diana Harmon 71 y.o.  female is here for follow-up for her iron deficiency anemia; patient last iron was in December 2017.  Patient continues to be on iron pills. Denies any blood in stools. Patient's energy levels are adequate. Patient denies any shortness of breath or chest pain. Appetite is good. No weight loss. Denies any constipation.  ROS: A complete 10 point review of system is done which is negative except mentioned above in history of present illness  MEDICAL HISTORY:  Past Medical History:  Diagnosis Date  . Anemia   . Benign tumor   . Diabetes mellitus without complication ([place])   . Hyperlipidemia   . Hypertension   . Nausea   . Patient is [name]   . Thyroid disease     SURGICAL HISTORY: Past Surgical History:  Procedure Laterality Date  . COLONOSCOPY    . COLONOSCOPY WITH PROPOFOL N/A 03/13/2015   Procedure: COLONOSCOPY WITH PROPOFOL;  Surgeon: Manya Silvas, MD;  Location: [facility] ENDOSCOPY;  Service: Endoscopy;  Laterality: N/A;  . ESOPHAGOGASTRODUODENOSCOPY N/A 03/13/2015   Procedure: ESOPHAGOGASTRODUODENOSCOPY (EGD);  Surgeon: Manya Silvas, MD;  Location: [facility] ENDOSCOPY;  Service: Endoscopy;  Laterality: N/A;  . THYROIDECTOMY    . tumor removed      SOCIAL HISTORY: Social History   Social History  . Marital status: Widowed    Spouse name: N/A  . Number of children: N/A  . Years of education: N/A   Occupational History  . Not on file.    Social History Main Topics  . Smoking status: Never Smoker  . Smokeless tobacco: Never Used  . Alcohol use 0.6 oz/week    1 Glasses of wine per week     Comment: occ. wine  . Drug use: No  . Sexual activity: Not on file   Other Topics Concern  . Not on file   Social History Narrative  . No narrative on file    FAMILY HISTORY: Family History  Problem Relation Age of Onset  . Parkinson's disease Mother   . Heart disease Father   . Lupus Sister   . Aneurysm Son   . Lupus Son     ALLERGIES:  is allergic to ferumoxytol; other; and penicillins.  MEDICATIONS:  Current Outpatient Prescriptions  Medication Sig Dispense Refill  . acetaminophen (TYLENOL) 500 MG tablet Take 1,000 mg by mouth every 6 (six) hours as needed for moderate pain.    Marland Kitchen aspirin EC 81 MG tablet Take 1 tablet (81 mg total) by mouth daily. 100 tablet 12  . Blood Glucose Monitoring Suppl (ACCU-CHEK NANO SMARTVIEW) w/Device KIT Use to check blood glucose up to once daily as instructed 1 kit 0  . cholecalciferol (VITAMIN D) 1000 units tablet Take 5,000 Units by mouth daily.    . Cromolyn Sodium (NASAL ALLERGY NA) Place 1 spray into the nose daily as needed (allergies).    . diclofenac sodium (VOLTAREN) 1 % GEL Apply 2 g topically 3 (three) times daily as needed. On affected knee joints 100 g 5  .  ferrous sulfate 325 (65 FE) MG tablet Take 1 tablet (325 mg total) by mouth 2 (two) times daily with a meal. 60 tablet 6  . glucose blood (ACCU-CHEK SMARTVIEW) test strip Check blood glucose up to once daily as instructed 100 each 5  . hydrochlorothiazide (HYDRODIURIL) 12.5 MG tablet Take 1 tablet (12.5 mg total) by mouth daily. 30 tablet 6  . lactobacillus acidophilus (BACID) TABS tablet Take 2 tablets by mouth 2 (two) times daily.    Marland Kitchen levocetirizine (XYZAL) 5 MG tablet Take 1 tablet (5 mg total) by mouth every evening. 30 tablet 12  . loratadine (CLARITIN) 10 MG tablet Take 10 mg by mouth daily.    Marland Kitchen losartan (COZAAR)  50 MG tablet Take 1 tablet (50 mg total) by mouth daily. 90 tablet 3  . metFORMIN (GLUCOPHAGE) 500 MG tablet Take 2 tablets (1,000 mg total) by mouth 2 (two) times daily with a meal. 360 tablet 1  . ranitidine (ZANTAC 75) 75 MG tablet Take 1 tablet (75 mg total) by mouth 2 (two) times daily. 60 tablet 12  . simvastatin (ZOCOR) 20 MG tablet Take 1 tablet (20 mg total) by mouth daily at 6 PM. 30 tablet 6   No current facility-administered medications for this visit.       Marland Kitchen  PHYSICAL EXAMINATION: ECOG PERFORMANCE STATUS: 1 - Symptomatic but completely ambulatory  Vitals:   12/24/16 1100  BP: 124/78  Pulse: 90  Resp: 20  Temp: 97.6 F (36.4 C)   There were no vitals filed for this visit.  GENERAL: Well-nourished well-developed; Alert, no distress and comfortable. Walks with a cane. Obese. Accompanied by her daughter. EYES: no pallor or icterus OROPHARYNX: no thrush or ulceration; good dentition  NECK: supple, no masses felt LYMPH: no palpable lymphadenopathy in the cervical, axillary or inguinal regions LUNGS: clear to auscultation and No wheeze or crackles HEART/CVS: regular rate & rhythm and no murmurs; No lower extremity edema ABDOMEN: abdomen soft, non-tender and normal bowel sounds Musculoskeletal:no cyanosis of digits and no clubbing  PSYCH: alert & oriented x 3 with fluent speech NEURO: no focal motor/sensory deficits SKIN: no rashes or significant lesions LABORATORY DATA:  I have reviewed the data as listed Lab Results  Component Value Date   WBC 3.7 12/19/2016   HGB 11.0 (L) 12/19/2016   HCT 34.4 (L) 12/19/2016   MCV 93.6 12/19/2016   PLT 236 12/19/2016    Recent Labs  06/03/16 0001 07/04/16 0001  08/14/16 1111 09/06/16 1049 09/16/16 0822 10/01/16 1053  NA 140 141  --  137 139 141 142  K 4.1 3.9  --  3.9 4.4 4.5 4.4  CL 104 105  --  101 107 107 107  CO2 27 25  --  _0 GLUCOSE 128* 125*  --  128* 139* 131* 114*  BUN 17 16  --  21* 43*  30* 32*  CREATININE 0.84 0.93  < > 0.91 1.30* 1.15* 1.07*  CALCIUM 9.2 9.1  --  9.3 9.7 9.4 9.7  GFRNONAA 71 62  --  >60 41* 48*  --   GFRAA 82 72  --  >60 47* 56*  --   PROT 7.5 7.5  --   --   --   --   --   ALBUMIN 3.5* 3.6  --   --   --   --   --   AST 32 50*  --   --   --   --   --  ALT 22 29  --   --   --   --   --   ALKPHOS 52 52  --   --   --   --   --   BILITOT 0.5 0.6  --   --   --   --   --   < > = values in this interval not displayed.  ASSESSMENT & PLAN:  Iron deficiency anemia due to chronic blood loss # Iron deficiency anemia-most likely secondary to AVMs of the colon [colonoscopy November 2016];last received IV venofer dec 2017 [pre-meds].   # Today Hb 11.0/iron studies- N; continue PO iron; NO IV iron today. Discussed re: Bone marrow Biopsy if worse. ? Aranesp [Creat 1.1]  # Follow up in 4 months/labs prior/ possible venofer.       Cammie Sickle, MD 12/24/2016 1:15 PM

## 2016-12-24 NOTE — Telephone Encounter (Signed)
Rx send for Dr. Raliegh Ip approval.

## 2016-12-24 NOTE — Assessment & Plan Note (Addendum)
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   # Today Hb 11.0/iron studies- N; continue PO iron; NO IV iron today. Discussed re: Bone marrow Biopsy if worse. ? Aranesp [Creat 1.1]  # Follow up in 4 months/labs prior/ possible venofer.

## 2017-01-14 ENCOUNTER — Ambulatory Visit (INDEPENDENT_AMBULATORY_CARE_PROVIDER_SITE_OTHER): Payer: Medicare Other | Admitting: Family Medicine

## 2017-01-14 ENCOUNTER — Encounter: Payer: Self-pay | Admitting: Family Medicine

## 2017-01-14 VITALS — BP 137/59 | HR 91 | Temp 98.7°F | Resp 16 | Ht 60.0 in | Wt 209.6 lb

## 2017-01-14 DIAGNOSIS — Z23 Encounter for immunization: Secondary | ICD-10-CM

## 2017-01-14 DIAGNOSIS — E119 Type 2 diabetes mellitus without complications: Secondary | ICD-10-CM | POA: Diagnosis not present

## 2017-01-14 DIAGNOSIS — Z1231 Encounter for screening mammogram for malignant neoplasm of breast: Secondary | ICD-10-CM | POA: Diagnosis not present

## 2017-01-14 DIAGNOSIS — H8101 Meniere's disease, right ear: Secondary | ICD-10-CM

## 2017-01-14 DIAGNOSIS — I350 Nonrheumatic aortic (valve) stenosis: Secondary | ICD-10-CM

## 2017-01-14 DIAGNOSIS — I10 Essential (primary) hypertension: Secondary | ICD-10-CM | POA: Diagnosis not present

## 2017-01-14 DIAGNOSIS — Z1239 Encounter for other screening for malignant neoplasm of breast: Secondary | ICD-10-CM

## 2017-01-14 LAB — POCT GLYCOSYLATED HEMOGLOBIN (HGB A1C): Hemoglobin A1C: 6.3 — AB (ref ?–5.7)

## 2017-01-14 NOTE — Progress Notes (Signed)
Subjective:    Patient ID: Diana Harmon, female    DOB: [birth year], 71 y.o.   MRN: [id number]  Diana Harmon is a 71 y.o. female presenting on 01/14/2017 for Diabetes (highest BS 147 and lowest 103)   HPI   FOLLOW-UP CHRONIC HTN / Meniere's Disease (Right Ear) - Last visit with me 10/08/16, for follow-up same problem, also recent Meniere's dx per [facility] ENT Dr Richardson Landry, she has had improved BP on current regimen, and has continued to follow-up with vestibular rehab with notable improvements, see prior notes for background information. - Interval update with has few more sessions vestibular, should be done by end of month - Today patient reports no new concerns with BP. She checks BP at home occasionally, good readings - Admits occasional dizziness still from Meniere's and has pressure in R ear, and she improves this with tilting head to L sometimes - Current Meds - Triameterene-HCTZ 37.5-25mg  daily, Losartan 50mg  daily Reports good compliance, took meds today. Tolerating well, w/o complaints. - Improved LE edema (R>L), improved dizziness Denies CP, dyspnea, HA, lightheadedness  CHRONIC DM, Type 2: Reports no concerns, feels improved diet has helped. CBGs: Avg 100-130. Low none. High 147. Checks CBGs few times weekly Meds: Metformin 500mg  x 2 tabs 1000mg  BID Reports good compliance. Tolerating well w/o side-effects Currently on ARB, ASA 81, Simvastatin 20mg  Lifestyle: - Diet (low carb diet tries to follow DM diet, eliminated bread in diet, less sugar in coffee) - Exercise (had been doing water aerobics, but then with Meniere's stopped, will return in 1 month) Denies hypoglycemia, polyuria, visual changes, numbness or tingling.  PMH - Mild Aortic Stenosis with heart murmur, followed by [facility] Cardiology Dr Yvone Neu, last visit 08/2016, she states that she needs to re-establish with new provider at their office, and not sure how often to follow-up. Last ECHO  08/2016.  Health Maintenance: - Breast CA Screening: Due for mammogram screening, last done 2015 at [facility]. She states received letter but never scheduled. Will agree to call and schedule now. - Due for routine Hep C screening, will consider this with next lab draw in 05/2017 - Due for Influenza Vaccine 2018, will receive today  Social History  Substance Use Topics  . Smoking status: Never Smoker  . Smokeless tobacco: Never Used  . Alcohol use 0.6 oz/week    1 Glasses of wine per week     Comment: occ. wine    Review of Systems Per HPI unless specifically indicated above     Objective:    BP (!) 137/59   Pulse 91   Temp 98.7 F (37.1 C) (Oral)   Resp 16   Ht 5' (1.524 m)   Wt 209 lb 9.6 oz (95.1 kg)   BMI 40.93 kg/m   Wt Readings from Last 3 Encounters:  01/14/17 209 lb 9.6 oz (95.1 kg)  10/08/16 214 lb (97.1 kg)  08/28/16 217 lb 6.4 oz (98.6 kg)    Physical Exam  Constitutional: She is oriented to person, place, and time. She appears well-developed and well-nourished. No distress.  Well-appearing, comfortable, cooperative, obese  HENT:  Head: Normocephalic and atraumatic.  Mouth/Throat: Oropharynx is clear and moist.  Eyes: Conjunctivae are normal. Right eye exhibits no discharge. Left eye exhibits no discharge.  Neck: Normal range of motion. Neck supple. No thyromegaly present.  Carotid bruit bilateral  Cardiovascular: Normal rate, regular rhythm and intact distal pulses.   Murmur ((2/6 systolic murmur over R sternal border, some radiation to  carotids appreciated)) heard. Pulmonary/Chest: Effort normal and breath sounds normal. No respiratory distress. She has no wheezes. She has no rales.  Musculoskeletal: Normal range of motion. She exhibits edema (R lower extremity ankle mild non pitting edema, improved from before).  Lymphadenopathy:    She has no cervical adenopathy.  Neurological: She is alert and oriented to person, place, and time.  Skin: Skin is  warm and dry. No rash noted. She is not diaphoretic. No erythema.  Psychiatric: She has a normal mood and affect. Her behavior is normal.  Well groomed, good eye contact, normal speech and thoughts  Nursing note and vitals reviewed.    Results for orders placed or performed in visit on 01/14/17  POCT HgB A1C  Result Value Ref Range   Hemoglobin A1C 6.3 (A) 5.7      Assessment & Plan:   Problem List Items Addressed This Visit    Mild aortic valve stenosis    Stable, without worsening symptoms Last ECHO / stress 08/2016 Followed by [facility], states needs to locate new provider after Dr Yvone Neu left      Meniere's disease of right ear    Stable, without current flare or recurrence Improved by [facility] ENT Vestibular rehab, continues until end of September Followed by Dr Richardson Landry, on [place] (primary) hypertension    Controlled HTN - Home BP readings normal range  Complicated by Meniere's R, Mild AS   Plan:  1. Continue current BP regimen - Triameterene-HCTZ 37.5-25mg  daily, Losartan 50mg  2. Encourage improved lifestyle - low sodium diet, resume regular exercise 3. Continue monitor BP outside office, bring readings to next visit, if persistently >140/90 or new symptoms notify office sooner 4. Follow-up 3 months HTN      Controlled diabetes mellitus type II without complication ([place]) - Primary    Well-controlled DM with A1c 6.3 (improved from 7.2) No known complications or hypoglycemia.  Plan:  1. Continue current therapy - Metformin 1000mg  BID (500mg  tabs) 2. Encourage improved lifestyle - low carb, low sugar diet, reduce portion size, continue improving regular exercise 3. Check CBG, bring log to next visit for review 4. Continue ASA, ARB, Statin 5. Follow-up 3 months DM A1c - if still good < 6.5 A1c, can reduce Metformin and space out apt      Relevant Orders   POCT HgB A1C (Completed)    Other Visit Diagnoses    Needs flu shot        Relevant Orders   Flu vaccine HIGH DOSE PF (Fluzone High dose) (Completed)   Screening for breast cancer       Relevant Orders   MM DIGITAL SCREENING BILATERAL      No orders of the defined types were placed in this encounter.   Follow up plan: Return in about 3 months (around 04/15/2017) for Diabetes A1c, HTN, Meniere's.  [name], [facility] 01/14/2017, 9:04 AM

## 2017-01-14 NOTE — Assessment & Plan Note (Signed)
Well-controlled DM with A1c 6.3 (improved from 7.2) No known complications or hypoglycemia.  Plan:  1. Continue current therapy - Metformin 1000mg  BID (500mg  tabs) 2. Encourage improved lifestyle - low carb, low sugar diet, reduce portion size, continue improving regular exercise 3. Check CBG, bring log to next visit for review 4. Continue ASA, ARB, Statin 5. Follow-up 3 months DM A1c - if still good < 6.5 A1c, can reduce Metformin and space out apt

## 2017-01-14 NOTE — Patient Instructions (Addendum)
Thank you for coming to the clinic today.  1.  [name] work! Keep it up. A1c 6.3. No changes today. In future can reduce metformin medicine if need.  For Mammogram screening for breast cancer  Call the [facility] below anytime to schedule your own appointment now that order has been placed.  [facility] [place], [place] [address] Phone: [phone]  Flu Shot today  Up to date on Pneumonia vaccine  -------------------------------------------------------- Last saw Dr Yvone Neu in 08/2016 - had ECHO and stress test. Mild aortic stenosis with heart murmur. Recommend contacting them to schedule routine follow-up either every 6 to 12 months.  [facility]) [facility] at [place], [place] [address] Main: [phone]   Please schedule a Follow-up Appointment to: Return in about 3 months (around 04/15/2017) for Diabetes A1c, HTN, Meniere's.  If you have any other questions or concerns, please feel free to call the clinic or send a message through [place]. You may also schedule an earlier appointment if necessary.  Additionally, you may be receiving a survey about your experience at our clinic within a few days to 1 week by e-mail or mail. We value your feedback.  [name], DO [place]

## 2017-01-14 NOTE — Assessment & Plan Note (Signed)
Stable, without worsening symptoms Last ECHO / stress 08/2016 Followed by [facility] Cardiology, states needs to locate new provider after Dr Yvone Neu left

## 2017-01-14 NOTE — Assessment & Plan Note (Signed)
Controlled HTN - Home BP readings normal range  Complicated by Meniere's R, Mild AS   Plan:  1. Continue current BP regimen - Triameterene-HCTZ 37.5-25mg  daily, Losartan 50mg  2. Encourage improved lifestyle - low sodium diet, resume regular exercise 3. Continue monitor BP outside office, bring readings to next visit, if persistently >140/90 or new symptoms notify office sooner 4. Follow-up 3 months HTN

## 2017-01-14 NOTE — Assessment & Plan Note (Signed)
Stable, without current flare or recurrence Improved by [facility] ENT Vestibular rehab, continues until end of September Followed by Dr Richardson Landry, on Triameterene-HCTZ

## 2017-01-27 ENCOUNTER — Other Ambulatory Visit: Payer: Self-pay

## 2017-01-27 DIAGNOSIS — E7800 Pure hypercholesterolemia, unspecified: Secondary | ICD-10-CM

## 2017-01-27 MED ORDER — SIMVASTATIN 20 MG PO TABS
20.0000 mg | ORAL_TABLET | Freq: Every day | ORAL | 3 refills | Status: DC
Start: 2017-01-27 — End: 2017-04-24

## 2017-01-29 ENCOUNTER — Ambulatory Visit
Admission: RE | Admit: 2017-01-29 | Discharge: 2017-01-29 | Disposition: A | Discharge status: Home / Self Care | Payer: Medicare Other | Source: Ambulatory Visit | Attending: Family Medicine | Admitting: Family Medicine

## 2017-01-29 DIAGNOSIS — R928 Other abnormal and inconclusive findings on diagnostic imaging of breast: Secondary | ICD-10-CM | POA: Insufficient documentation

## 2017-01-29 DIAGNOSIS — Z1231 Encounter for screening mammogram for malignant neoplasm of breast: Secondary | ICD-10-CM | POA: Insufficient documentation

## 2017-01-29 DIAGNOSIS — Z1239 Encounter for other screening for malignant neoplasm of breast: Secondary | ICD-10-CM

## 2017-01-30 ENCOUNTER — Other Ambulatory Visit: Payer: Self-pay | Admitting: Family Medicine

## 2017-01-30 DIAGNOSIS — N6489 Other specified disorders of breast: Secondary | ICD-10-CM

## 2017-01-30 DIAGNOSIS — R928 Other abnormal and inconclusive findings on diagnostic imaging of breast: Secondary | ICD-10-CM

## 2017-02-13 ENCOUNTER — Ambulatory Visit
Admission: RE | Admit: 2017-02-13 | Discharge: 2017-02-13 | Disposition: A | Discharge status: Home / Self Care | Payer: Medicare Other | Source: Ambulatory Visit | Attending: Family Medicine | Admitting: Family Medicine

## 2017-02-13 DIAGNOSIS — N6489 Other specified disorders of breast: Secondary | ICD-10-CM | POA: Diagnosis present

## 2017-02-13 DIAGNOSIS — R928 Other abnormal and inconclusive findings on diagnostic imaging of breast: Secondary | ICD-10-CM | POA: Diagnosis present

## 2017-04-08 ENCOUNTER — Ambulatory Visit (INDEPENDENT_AMBULATORY_CARE_PROVIDER_SITE_OTHER): Payer: Medicare Other

## 2017-04-08 VITALS — BP 166/82 | HR 72 | Temp 98.2°F | Resp 15 | Ht 60.0 in | Wt 210.4 lb

## 2017-04-08 DIAGNOSIS — Z0000 Encounter for general adult medical examination without abnormal findings: Secondary | ICD-10-CM | POA: Diagnosis not present

## 2017-04-08 DIAGNOSIS — Z1159 Encounter for screening for other viral diseases: Secondary | ICD-10-CM

## 2017-04-08 NOTE — Progress Notes (Signed)
Subjective:   Diana Harmon is a 71 y.o. female who presents for Medicare Annual (Subsequent) preventive examination.  Review of Systems:   Cardiac Risk Factors include: hypertension;advanced age (>53mn, >>44women);diabetes mellitus;obesity (BMI >30kg/m2);dyslipidemia     Objective:     Vitals: BP (!) 166/82 (BP Location: Left Arm, Patient Position: Sitting)   Pulse 72   Temp 98.2 F (36.8 C) (Oral)   Resp 15   Ht 5' (1.524 m)   Wt 210 lb 6.4 oz (95.4 kg)   BMI 41.09 kg/m   Body mass index is 41.09 kg/m.  Advanced Directives 04/08/2017 12/24/2016 08/20/2016 10/17/2015 04/11/2015 02/28/2015  Does Patient Have a Medical Advance Directive? No No Yes Yes Yes No;Yes  Type of Advance Directive - - Living will - Healthcare Power of [place] Does patient want to make changes to medical advance directive? - - - - No - Patient declined No - Patient declined  Copy of [place] Chart? - - - - Yes Yes  Would patient like information on creating a medical advance directive? Yes (MAU/Ambulatory/Procedural Areas - Information given) No - Patient declined - - - -    Tobacco Social History   Tobacco Use  Smoking Status Never Smoker  Smokeless Tobacco Never Used     Counseling given: Not Answered   Clinical Intake:  Pre-visit preparation completed: Yes  Pain : 0-10 Pain Score: 10-Worst pain ever Pain Type: Chronic pain Pain Location: Knee Pain Orientation: Right, Left Pain Descriptors / Indicators: Aching Pain Onset: More than a month ago Pain Frequency: Constant Pain Relieving Factors: tylenol   Pain Relieving Factors: tylenol   Nutritional Status: BMI > 30  Obese Nutritional Risks: None Diabetes: Yes CBG done?: No Did pt. bring in CBG monitor from home?: No  Activities of Daily Living: Independent Ambulation: Independent with device- listed below Home Assistive Devices/Equipment: Dentures (specify type),  Cane, Elevated toliet seat, Shower/tub chair, Other (Comment)(total dentures) Medication Administration: Independent Home Management: Independent  Barriers to Care Management & Learning: None  Do you feel unsafe in your current relationship?: No(divorced) Do you feel physically threatened by others?: No Anyone hurting you at home, work, or school?: No Unable to ask?: No  How often do you need to have someone help you when you read instructions, pamphlets, or other written materials from your doctor or pharmacy?: 1 - Never What is the last grade level you completed in school?: 12th grade     Information entered by :: Sander Speckman,LPN  Past Medical History:  Diagnosis Date  . Anemia   . Benign tumor   . Diabetes mellitus without complication ([place]   . Hyperlipidemia   . Hypertension   . Nausea   . Patient is [name]   . Thyroid disease    Past Surgical History:  Procedure Laterality Date  . COLONOSCOPY    . COLONOSCOPY WITH PROPOFOL N/A 03/13/2015   Procedure: COLONOSCOPY WITH PROPOFOL;  Surgeon: RManya Silvas MD;  [facility];  Service: Endoscopy;  Laterality: N/A;  . ESOPHAGOGASTRODUODENOSCOPY N/A 03/13/2015   Procedure: ESOPHAGOGASTRODUODENOSCOPY (EGD);  Surgeon: RManya Silvas MD;  [facility];  Service: Endoscopy;  Laterality: N/A;  . THYROIDECTOMY    . tumor removed     Family History  Problem Relation Age of Onset  . Parkinson's disease Mother   . Heart disease Father   . Lupus Sister   . Aneurysm Son   . Lupus  Son    Social History   Socioeconomic History  . Marital status: Divorced    Spouse name: None  . Number of children: None  . Years of education: 66  . Highest education level: 12th grade  Social Needs  . Financial resource strain: Not hard at all  . Food insecurity - worry: Sometimes true  . Food insecurity - inability: Never true  . Transportation needs - medical: No  . Transportation needs -  non-medical: No  Occupational History  . None  Tobacco Use  . Smoking status: Never Smoker  . Smokeless tobacco: Never Used  Substance and Sexual Activity  . Alcohol use: Yes    Alcohol/week: 0.6 oz    Types: 1 Glasses of wine per week    Comment: occ. wine  . Drug use: No  . Sexual activity: None  Other Topics Concern  . None  Social History Narrative  . None    Outpatient Encounter Medications as of 04/08/2017  Medication Sig  . acetaminophen (TYLENOL) 500 MG tablet Take 1,000 mg by mouth every 6 (six) hours as needed for moderate pain.  Marland Kitchen aspirin EC 81 MG tablet Take 1 tablet (81 mg total) by mouth daily.  . Blood Glucose Monitoring Suppl (ACCU-CHEK NANO SMARTVIEW) w/Device KIT Use to check blood glucose up to once daily as instructed  . cholecalciferol (VITAMIN D) 1000 units tablet Take 5,000 Units by mouth daily.  . Cromolyn Sodium (NASAL ALLERGY NA) Place 1 spray into the nose daily as needed (allergies).  . diclofenac sodium (VOLTAREN) 1 % GEL Apply 2 g topically 3 (three) times daily as needed. On affected knee joints  . ferrous sulfate 325 (65 FE) MG tablet Take 1 tablet (325 mg total) by mouth 2 (two) times daily with a meal.  . glucose blood (ACCU-CHEK SMARTVIEW) test strip Check blood glucose up to once daily as instructed  . hydrochlorothiazide (HYDRODIURIL) 12.5 MG tablet Take 1 tablet (12.5 mg total) by mouth daily.  Marland Kitchen lactobacillus acidophilus (BACID) TABS tablet Take 2 tablets by mouth 2 (two) times daily.  Marland Kitchen levocetirizine (XYZAL) 5 MG tablet Take 1 tablet (5 mg total) by mouth every evening.  . loratadine (CLARITIN) 10 MG tablet Take 10 mg by mouth daily.  Marland Kitchen losartan (COZAAR) 50 MG tablet Take 1 tablet (50 mg total) by mouth daily.  . metFORMIN (GLUCOPHAGE) 500 MG tablet Take 2 tablets (1,000 mg total) by mouth 2 (two) times daily with a meal.  . simvastatin (ZOCOR) 20 MG tablet Take 1 tablet (20 mg total) by mouth daily at 6 PM.  . [DISCONTINUED] ranitidine  (ZANTAC 75) 75 MG tablet Take 1 tablet (75 mg total) by mouth 2 (two) times daily. (Patient not taking: Reported on 04/08/2017)   No facility-administered encounter medications on file as of 04/08/2017.     Activities of Daily Living In your present state of health, do you have any difficulty performing the following activities: 04/08/2017 05/16/2016  Hearing? Y N  Comment sees Dr.Bennett -  Vision? N N  Difficulty concentrating or making decisions? N N  Walking or climbing stairs? Y Y  Comment due to knee pain use 4 prong cane  Dressing or bathing? N N  Doing errands, shopping? N N  Preparing Food and eating ? N -  Using the Toilet? N -  In the past six months, have you accidently leaked urine? N -  Do you have problems with loss of bowel control? N -  Managing your  Medications? N -  Managing your Finances? N -  Housekeeping or managing your Housekeeping? N -  Some recent data might be hidden    Timed Get Up and Go performed: completed in 12 seconds with use of cane.   Patient Care Team: [name], DO as PCP - General (Family Medicine) Cammie Sickle, MD as Consulting Physician (Internal Medicine)    Assessment:     Exercise Activities and Dietary recommendations Current Exercise Habits: Structured exercise class, Type of exercise: strength training/weights(water aerobics), Time (Minutes): 60, Frequency (Times/Week): 2, Weekly Exercise (Minutes/Week): 120, Intensity: Mild, Exercise limited by: orthopedic condition(s)(knee pain)  Goals    . DIET - INCREASE WATER INTAKE     Recommend drinking at least 6-8 glasses of water a day       Fall Risk Fall Risk  04/08/2017 08/28/2016 05/16/2016 10/26/2015 04/12/2015  Falls in the past year? _0    Is the patient's home free of loose throw rugs in walkways, pet beds, electrical cords, etc?   yes      Grab bars in the bathroom? no      Handrails on the stairs?   yes      Adequate lighting?    yes  Depression Screen PHQ 2/9 Scores 04/08/2017 05/16/2016 10/26/2015 04/12/2015  PHQ - 2 Score 0 0 0 0     Cognitive Function     6CIT Screen 04/08/2017  What Year? 0 points  What month? 0 points  What time? 0 points  Count back from 20 0 points  Months in reverse 0 points  Repeat phrase 0 points  Total Score 0    Immunization History  Administered Date(s) Administered  . Influenza, High Dose Seasonal PF 02/09/2014, 01/05/2015, 01/30/2016, 01/14/2017  . Influenza,inj,Quad PF,6+ Mos 04/05/2013  . Influenza-Unspecified 02/03/2014  . Pneumococcal Conjugate-13 08/05/2014  . Pneumococcal Polysaccharide-23 04/05/2013   Screening Tests Health Maintenance  Topic Date Due  . Hepatitis C Screening  May 23, 1945  . OPHTHALMOLOGY EXAM  06/06/2017  . HEMOGLOBIN A1C  07/14/2017  . FOOT EXAM  08/28/2017  . MAMMOGRAM  01/30/2019  . TETANUS/TDAP  12/04/2024  . COLONOSCOPY  03/12/2025  . INFLUENZA VACCINE  Completed  . DEXA SCAN  Completed  . PNA vac Low Risk Adult  Completed   Cancer Screenings: Lung: Low Dose CT Chest recommended if Age 21-80 years, 30 pack-year currently smoking OR have quit w/in 15years. Patient does not qualify. Breast: Up to date on Mammogram? Yes  Up to date of Bone Density/Dexa? Yes Colorectal: completed 03/13/2015  Additional Screenings: Hepatitis B/HIV/Syphillis: not indicated Hepatitis C Screening: due, will order for next lab testing     Plan:     I have personally reviewed and addressed the Medicare Annual Wellness questionnaire and have noted the following in the patient's chart:  A. Medical and social history B. Use of alcohol, tobacco or illicit drugs  C. Current medications and supplements D. Functional ability and status E.  Nutritional status F.  Physical activity G. Advance directives H. List of other physicians I.  Hospitalizations, surgeries, and ER visits in previous 12 months J.  [place] such as hearing and vision if  needed, cognitive and depression L. Referrals and appointments   In addition, I have reviewed and discussed with patient certain preventive protocols, quality metrics, and best practice recommendations. A written personalized care plan for preventive services as well as general preventive health recommendations were provided to patient.   Signed,  Tyler Aas, LPN Nurse Health Advisor   Nurse Notes:

## 2017-04-08 NOTE — Patient Instructions (Signed)
Diana Harmon , Thank you for taking time to come for your Medicare Wellness Visit. I appreciate your ongoing commitment to your health goals. Please review the following plan we discussed and let me know if I can assist you in the future.   Screening recommendations/referrals: Colonoscopy: completed 03/13/2015 Mammogram: completed 02/13/2017 Bone Density: completed 05/18/2014 Recommended yearly ophthalmology/optometry visit for glaucoma screening and checkup Recommended yearly dental visit for hygiene and checkup  Vaccinations: Influenza vaccine: up to date  Pneumococcal vaccine: up to date  Tdap vaccine: up to date Shingles vaccine: due, check with your insurance company for coverage   Advanced directives: Advance directive discussed with you today. I have provided a copy for you to complete at home and have notarized. Once this is complete please bring a copy in to our office so we can scan it into your chart.  Conditions/risks identified: Recommend drinking at least 6-8 glasses of water a day    Next appointment: Follow up on 04/17/2017 at 8:00 am with Dr.Karamalaegos. Follow up in one year for your annually.    Preventive Care 70 Years and Older, Female Preventive care refers to lifestyle choices and visits with your health care provider that can promote health and wellness. What does preventive care include?  A yearly physical exam. This is also called an annual well check.  Dental exams once or twice a year.  Routine eye exams. Ask your health care provider how often you should have your eyes checked.  Personal lifestyle choices, including:  Daily care of your teeth and gums.  Regular physical activity.  Eating a healthy diet.  Avoiding tobacco and drug use.  Limiting alcohol use.  Practicing safe sex.  Taking low-dose aspirin every day.  Taking vitamin and mineral supplements as recommended by your health care provider. What happens during an annual well  check? The services and screenings done by your health care provider during your annual well check will depend on your age, overall health, lifestyle risk factors, and family history of disease. Counseling  Your health care provider may ask you questions about your:  Alcohol use.  Tobacco use.  Drug use.  Emotional well-being.  Home and relationship well-being.  Sexual activity.  Eating habits.  History of falls.  Memory and ability to understand (cognition).  Work and work Statistician.  Reproductive health. Screening  You may have the following tests or measurements:  Height, weight, and BMI.  Blood pressure.  Lipid and cholesterol levels. These may be checked every 5 years, or more frequently if you are over 53 years old.  Skin check.  Lung cancer screening. You may have this screening every year starting at age 20 if you have a 30-pack-year history of smoking and currently smoke or have quit within the past 15 years.  Fecal occult blood test (FOBT) of the stool. You may have this test every year starting at age 77.  Flexible sigmoidoscopy or colonoscopy. You may have a sigmoidoscopy every 5 years or a colonoscopy every 10 years starting at age 37.  Hepatitis C blood test.  Hepatitis B blood test.  Sexually transmitted disease (STD) testing.  Diabetes screening. This is done by checking your blood sugar (glucose) after you have not eaten for a while (fasting). You may have this done every 1-3 years.  Bone density scan. This is done to screen for osteoporosis. You may have this done starting at age 37.  Mammogram. This may be done every 1-2 years. Talk to your health care  provider about how often you should have regular mammograms. Talk with your health care provider about your test results, treatment options, and if necessary, the need for more tests. Vaccines  Your health care provider may recommend certain vaccines, such as:  Influenza vaccine. This is  recommended every year.  Tetanus, diphtheria, and acellular pertussis (Tdap, Td) vaccine. You may need a Td booster every 10 years.  Zoster vaccine. You may need this after age 49.  Pneumococcal 13-valent conjugate (PCV13) vaccine. One dose is recommended after age 42.  Pneumococcal polysaccharide (PPSV23) vaccine. One dose is recommended after age 76. Talk to your health care provider about which screenings and vaccines you need and how often you need them. This information is not intended to replace advice given to you by your health care provider. Make sure you discuss any questions you have with your health care provider. Document Released: 05/19/2015 Document Revised: 01/10/2016 Document Reviewed: 02/21/2015 Elsevier Interactive Patient Education  2017 [place] Prevention in the Home Falls can cause injuries. They can happen to people of all ages. There are many things you can do to make your home safe and to help prevent falls. What can I do on the outside of my home?  Regularly fix the edges of walkways and driveways and fix any cracks.  Remove anything that might make you trip as you walk through a door, such as a raised step or threshold.  Trim any bushes or trees on the path to your home.  Use bright outdoor lighting.  Clear any walking paths of anything that might make someone trip, such as rocks or tools.  Regularly check to see if handrails are loose or broken. Make sure that both sides of any steps have handrails.  Any raised decks and porches should have guardrails on the edges.  Have any leaves, snow, or ice cleared regularly.  Use sand or salt on walking paths during winter.  Clean up any spills in your garage right away. This includes oil or grease spills. What can I do in the bathroom?  Use night lights.  Install grab bars by the toilet and in the tub and shower. Do not use towel bars as grab bars.  Use non-skid mats or decals in the tub or  shower.  If you need to sit down in the shower, use a plastic, non-slip stool.  Keep the floor dry. Clean up any water that spills on the floor as soon as it happens.  Remove soap buildup in the tub or shower regularly.  Attach bath mats securely with double-sided non-slip rug tape.  Do not have throw rugs and other things on the floor that can make you trip. What can I do in the bedroom?  Use night lights.  Make sure that you have a light by your bed that is easy to reach.  Do not use any sheets or blankets that are too big for your bed. They should not hang down onto the floor.  Have a firm chair that has side arms. You can use this for support while you get dressed.  Do not have throw rugs and other things on the floor that can make you trip. What can I do in the kitchen?  Clean up any spills right away.  Avoid walking on wet floors.  Keep items that you use a lot in easy-to-reach places.  If you need to reach something above you, use a strong step stool that has a grab bar.  Keep electrical cords out of the way.  Do not use floor polish or wax that makes floors slippery. If you must use wax, use non-skid floor wax.  Do not have throw rugs and other things on the floor that can make you trip. What can I do with my stairs?  Do not leave any items on the stairs.  Make sure that there are handrails on both sides of the stairs and use them. Fix handrails that are broken or loose. Make sure that handrails are as long as the stairways.  Check any carpeting to make sure that it is firmly attached to the stairs. Fix any carpet that is loose or worn.  Avoid having throw rugs at the top or bottom of the stairs. If you do have throw rugs, attach them to the floor with carpet tape.  Make sure that you have a light switch at the top of the stairs and the bottom of the stairs. If you do not have them, ask someone to add them for you. What else can I do to help prevent  falls?  Wear shoes that:  Do not have high heels.  Have rubber bottoms.  Are comfortable and fit you well.  Are closed at the toe. Do not wear sandals.  If you use a stepladder:  Make sure that it is fully opened. Do not climb a closed stepladder.  Make sure that both sides of the stepladder are locked into place.  Ask someone to hold it for you, if possible.  Clearly mark and make sure that you can see:  Any grab bars or handrails.  First and last steps.  Where the edge of each step is.  Use tools that help you move around (mobility aids) if they are needed. These include:  Canes.  Walkers.  Scooters.  Crutches.  Turn on the lights when you go into a dark area. Replace any light bulbs as soon as they burn out.  Set up your furniture so you have a clear path. Avoid moving your furniture around.  If any of your floors are uneven, fix them.  If there are any pets around you, be aware of where they are.  Review your medicines with your doctor. Some medicines can make you feel dizzy. This can increase your chance of falling. Ask your doctor what other things that you can do to help prevent falls. This information is not intended to replace advice given to you by your health care provider. Make sure you discuss any questions you have with your health care provider. Document Released: 02/16/2009 Document Revised: 09/28/2015 Document Reviewed: 05/27/2014 Elsevier Interactive Patient Education  2017 [employer].

## 2017-04-15 ENCOUNTER — Inpatient Hospital Stay: Payer: Medicare Other | Attending: Internal Medicine

## 2017-04-15 DIAGNOSIS — E119 Type 2 diabetes mellitus without complications: Secondary | ICD-10-CM | POA: Insufficient documentation

## 2017-04-15 DIAGNOSIS — D709 Neutropenia, unspecified: Secondary | ICD-10-CM | POA: Insufficient documentation

## 2017-04-15 DIAGNOSIS — R110 Nausea: Secondary | ICD-10-CM | POA: Insufficient documentation

## 2017-04-15 DIAGNOSIS — Z7984 Long term (current) use of oral hypoglycemic drugs: Secondary | ICD-10-CM | POA: Insufficient documentation

## 2017-04-15 DIAGNOSIS — D509 Iron deficiency anemia, unspecified: Secondary | ICD-10-CM | POA: Diagnosis not present

## 2017-04-15 DIAGNOSIS — E079 Disorder of thyroid, unspecified: Secondary | ICD-10-CM | POA: Diagnosis not present

## 2017-04-15 DIAGNOSIS — I10 Essential (primary) hypertension: Secondary | ICD-10-CM | POA: Insufficient documentation

## 2017-04-15 DIAGNOSIS — E785 Hyperlipidemia, unspecified: Secondary | ICD-10-CM | POA: Diagnosis not present

## 2017-04-15 DIAGNOSIS — Z7982 Long term (current) use of aspirin: Secondary | ICD-10-CM | POA: Diagnosis not present

## 2017-04-15 DIAGNOSIS — E890 Postprocedural hypothyroidism: Secondary | ICD-10-CM | POA: Insufficient documentation

## 2017-04-15 DIAGNOSIS — Z79899 Other long term (current) drug therapy: Secondary | ICD-10-CM | POA: Insufficient documentation

## 2017-04-15 DIAGNOSIS — D500 Iron deficiency anemia secondary to blood loss (chronic): Secondary | ICD-10-CM

## 2017-04-15 DIAGNOSIS — K922 Gastrointestinal hemorrhage, unspecified: Secondary | ICD-10-CM | POA: Insufficient documentation

## 2017-04-15 DIAGNOSIS — Z85048 Personal history of other malignant neoplasm of rectum, rectosigmoid junction, and anus: Secondary | ICD-10-CM | POA: Diagnosis not present

## 2017-04-15 LAB — COMPREHENSIVE METABOLIC PANEL
ALK PHOS: 54 U/L (ref 38–126)
ALT: 20 U/L (ref 14–54)
ANION GAP: 9 (ref 5–15)
AST: 32 U/L (ref 15–41)
Albumin: 3.6 g/dL (ref 3.5–5.0)
BILIRUBIN TOTAL: 0.5 mg/dL (ref 0.3–1.2)
BUN: 23 mg/dL — ABNORMAL HIGH (ref 6–20)
CALCIUM: 9.1 mg/dL (ref 8.9–10.3)
CO2: 27 mmol/L (ref 22–32)
Chloride: 103 mmol/L (ref 101–111)
Creatinine, Ser: 0.96 mg/dL (ref 0.44–1.00)
GFR calc non Af Amer: 59 mL/min — ABNORMAL LOW (ref >60)
GLUCOSE: 121 mg/dL — AB (ref 65–99)
POTASSIUM: 3.9 mmol/L (ref 3.5–5.1)
Sodium: 139 mmol/L (ref 135–145)
Total Protein: 8.3 g/dL — ABNORMAL HIGH (ref 6.5–8.1)

## 2017-04-15 LAB — CBC WITH DIFFERENTIAL/PLATELET
BASOS ABS: 0 10*3/uL (ref 0–0.1)
BASOS PCT: 1 %
Eosinophils Absolute: 0.2 10*3/uL (ref 0–0.7)
Eosinophils Relative: 7 %
HEMATOCRIT: 35.6 % (ref 35.0–47.0)
HEMOGLOBIN: 11.4 g/dL — AB (ref 12.0–16.0)
LYMPHS PCT: 45 %
Lymphs Abs: 1.4 10*3/uL (ref 1.0–3.6)
MCH: 30.1 pg (ref 26.0–34.0)
MCHC: 32 g/dL (ref 32.0–36.0)
MCV: 94 fL (ref 80.0–100.0)
MONO ABS: 0.4 10*3/uL (ref 0.2–0.9)
MONOS PCT: 13 %
NEUTROS ABS: 1 10*3/uL — AB (ref 1.4–6.5)
NEUTROS PCT: 34 %
Platelets: 242 10*3/uL (ref 150–440)
RBC: 3.79 MIL/uL — ABNORMAL LOW (ref 3.80–5.20)
RDW: 13.5 % (ref 11.5–14.5)
WBC: 3 10*3/uL — ABNORMAL LOW (ref 3.6–11.0)

## 2017-04-15 LAB — IRON AND TIBC
IRON: 69 ug/dL (ref 28–170)
Saturation Ratios: 21 % (ref 10.4–31.8)
TIBC: 325 ug/dL (ref 250–450)
UIBC: 256 ug/dL

## 2017-04-15 LAB — FERRITIN: Ferritin: 226 ng/mL (ref 11–307)

## 2017-04-17 ENCOUNTER — Ambulatory Visit: Payer: Medicare Other | Admitting: Family Medicine

## 2017-04-22 ENCOUNTER — Other Ambulatory Visit: Payer: Self-pay

## 2017-04-22 ENCOUNTER — Encounter: Payer: Self-pay | Admitting: Internal Medicine

## 2017-04-22 ENCOUNTER — Inpatient Hospital Stay: Payer: Medicare Other

## 2017-04-22 ENCOUNTER — Inpatient Hospital Stay (HOSPITAL_BASED_OUTPATIENT_CLINIC_OR_DEPARTMENT_OTHER): Payer: Medicare Other | Admitting: Internal Medicine

## 2017-04-22 VITALS — BP 167/81 | HR 89 | Temp 97.0°F | Resp 18 | Ht 60.0 in | Wt 209.4 lb

## 2017-04-22 DIAGNOSIS — Z7982 Long term (current) use of aspirin: Secondary | ICD-10-CM | POA: Diagnosis not present

## 2017-04-22 DIAGNOSIS — E079 Disorder of thyroid, unspecified: Secondary | ICD-10-CM

## 2017-04-22 DIAGNOSIS — E890 Postprocedural hypothyroidism: Secondary | ICD-10-CM | POA: Diagnosis not present

## 2017-04-22 DIAGNOSIS — Z85048 Personal history of other malignant neoplasm of rectum, rectosigmoid junction, and anus: Secondary | ICD-10-CM

## 2017-04-22 DIAGNOSIS — R110 Nausea: Secondary | ICD-10-CM

## 2017-04-22 DIAGNOSIS — E119 Type 2 diabetes mellitus without complications: Secondary | ICD-10-CM

## 2017-04-22 DIAGNOSIS — Z7984 Long term (current) use of oral hypoglycemic drugs: Secondary | ICD-10-CM

## 2017-04-22 DIAGNOSIS — E785 Hyperlipidemia, unspecified: Secondary | ICD-10-CM | POA: Diagnosis not present

## 2017-04-22 DIAGNOSIS — Z79899 Other long term (current) drug therapy: Secondary | ICD-10-CM

## 2017-04-22 DIAGNOSIS — D649 Anemia, unspecified: Secondary | ICD-10-CM

## 2017-04-22 DIAGNOSIS — D709 Neutropenia, unspecified: Secondary | ICD-10-CM

## 2017-04-22 DIAGNOSIS — D509 Iron deficiency anemia, unspecified: Secondary | ICD-10-CM

## 2017-04-22 DIAGNOSIS — D500 Iron deficiency anemia secondary to blood loss (chronic): Secondary | ICD-10-CM

## 2017-04-22 DIAGNOSIS — I10 Essential (primary) hypertension: Secondary | ICD-10-CM | POA: Diagnosis not present

## 2017-04-22 DIAGNOSIS — K922 Gastrointestinal hemorrhage, unspecified: Secondary | ICD-10-CM

## 2017-04-22 NOTE — Assessment & Plan Note (Signed)
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   # Today Hb 11.2/iron studies- N; continue PO iron; NO IV iron today. Discussed re: Bone marrow Biopsy if worse. ? Aranesp [Creat 1.1]. Hold off BMBx for now.   # Mild-mod intermittent neutropenia- ? Ethnic benign vs others. Asymtomatic.   # Follow up in 4 months/labs prior- b12/folate/copper/ zinc/ possible venofer.

## 2017-04-22 NOTE — Progress Notes (Signed)
[place] NOTE  Patient Care Team: [name], DO as PCP - General (Family Medicine) Cammie Sickle, MD as Consulting Physician (Internal Medicine)  CHIEF COMPLAINTS/PURPOSE OF CONSULTATION:   # AUG 2016- IRON DEFICIENCY ANEMIA- Possible AVMs of colon [s/p Argon laser; Dr.Elliot;EGD-neg Nov 2016] s/p IV Ferriheme x2 [[id number]; ALLERGY]; Venofer IV   # Rectal well diff carcinoid [1.2CM; s/p polypectomy 2011]; Allergy- [facility] Bouchard Philomena Course 2016]- neck/face swelling [improved with benadryl]  HISTORY OF PRESENTING ILLNESS:  Diana Harmon 71 y.o.  female is here for follow-up for her iron deficiency anemia; patient last iron was in December 2017.  Patient continues to be on iron pills. Denies any blood in stools. Patient's energy levels are adequate. Patient denies any shortness of breath or chest pain. Appetite is good. No weight loss. Denies any constipation.  ROS: A complete 10 point review of system is done which is negative except mentioned above in history of present illness  MEDICAL HISTORY:  Past Medical History:  Diagnosis Date  . Anemia   . Benign tumor   . Diabetes mellitus without complication ([place])   . Hyperlipidemia   . Hypertension   . Nausea   . Patient is [name]   . Thyroid disease     SURGICAL HISTORY: Past Surgical History:  Procedure Laterality Date  . COLONOSCOPY    . COLONOSCOPY WITH PROPOFOL N/A 03/13/2015   Procedure: COLONOSCOPY WITH PROPOFOL;  Surgeon: Manya Silvas, MD;  [facility] ENDOSCOPY;  Service: Endoscopy;  Laterality: N/A;  . ESOPHAGOGASTRODUODENOSCOPY N/A 03/13/2015   Procedure: ESOPHAGOGASTRODUODENOSCOPY (EGD);  Surgeon: Manya Silvas, MD;  Location: [facility] ENDOSCOPY;  Service: Endoscopy;  Laterality: N/A;  . THYROIDECTOMY    . tumor removed      SOCIAL HISTORY: Social History   Socioeconomic History  . Marital status: Divorced    Spouse name: Not on file  . Number of  children: Not on file  . Years of education: 57  . Highest education level: 12th grade  Social Needs  . Financial resource strain: Not hard at all  . Food insecurity - worry: Sometimes true  . Food insecurity - inability: Never true  . Transportation needs - medical: No  . Transportation needs - non-medical: No  Occupational History  . Not on file  Tobacco Use  . Smoking status: Never Smoker  . Smokeless tobacco: Never Used  Substance and Sexual Activity  . Alcohol use: Yes    Alcohol/week: 0.6 oz    Types: 1 Glasses of wine per week    Comment: occ. wine  . Drug use: No  . Sexual activity: Not on file  Other Topics Concern  . Not on file  Social History Narrative  . Not on file    FAMILY HISTORY: Family History  Problem Relation Age of Onset  . Parkinson's disease Mother   . Heart disease Father   . Lupus Sister   . Aneurysm Son   . Lupus Son     ALLERGIES:  is allergic to ferumoxytol; other; and penicillins.  MEDICATIONS:  Current Outpatient Medications  Medication Sig Dispense Refill  . acetaminophen (TYLENOL) 500 MG tablet Take 1,000 mg by mouth every 6 (six) hours as needed for moderate pain.    Marland Kitchen aspirin EC 81 MG tablet Take 1 tablet (81 mg total) by mouth daily. 100 tablet 12  . Blood Glucose Monitoring Suppl (ACCU-CHEK NANO SMARTVIEW) w/Device KIT Use to check blood glucose up to once daily as  instructed 1 kit 0  . cholecalciferol (VITAMIN D) 1000 units tablet Take 5,000 Units by mouth daily.    . Cromolyn Sodium (NASAL ALLERGY NA) Place 1 spray into the nose daily as needed (allergies).    . diclofenac sodium (VOLTAREN) 1 % GEL Apply 2 g topically 3 (three) times daily as needed. On affected knee joints 100 g 5  . ferrous sulfate 325 (65 FE) MG tablet Take 1 tablet (325 mg total) by mouth 2 (two) times daily with a meal. 60 tablet 6  . glucose blood (ACCU-CHEK SMARTVIEW) test strip Check blood glucose up to once daily as instructed 100 each 5  .  hydrochlorothiazide (HYDRODIURIL) 12.5 MG tablet Take 1 tablet (12.5 mg total) by mouth daily. 30 tablet 6  . lactobacillus acidophilus (BACID) TABS tablet Take 2 tablets by mouth 2 (two) times daily.    Marland Kitchen loratadine (CLARITIN) 10 MG tablet Take 10 mg by mouth daily.    Marland Kitchen losartan (COZAAR) 50 MG tablet Take 1 tablet (50 mg total) by mouth daily. 90 tablet 3  . metFORMIN (GLUCOPHAGE) 500 MG tablet Take 2 tablets (1,000 mg total) by mouth 2 (two) times daily with a meal. 360 tablet 3  . simvastatin (ZOCOR) 20 MG tablet Take 1 tablet (20 mg total) by mouth daily at 6 PM. 30 tablet 3  . levocetirizine (XYZAL) 5 MG tablet Take 1 tablet (5 mg total) by mouth every evening. (Patient not taking: Reported on 04/22/2017) 30 tablet 12   No current facility-administered medications for this visit.       Marland Kitchen  PHYSICAL EXAMINATION: ECOG PERFORMANCE STATUS: 1 - Symptomatic but completely ambulatory  Vitals:   04/22/17 1012  BP: (!) 167/81  Pulse: 89  Resp: 18  Temp: (!) 97 F (36.1 C)   Filed Weights   04/22/17 1012  Weight: 209 lb 7 oz (95 kg)    GENERAL: Well-nourished well-developed; Alert, no distress and comfortable. Walks with a cane. Obese. She is alone.  EYES: no pallor or icterus OROPHARYNX: no thrush or ulceration; good dentition  NECK: supple, no masses felt LYMPH: no palpable lymphadenopathy in the cervical, axillary or inguinal regions LUNGS: clear to auscultation and No wheeze or crackles HEART/CVS: regular rate & rhythm and no murmurs; No lower extremity edema ABDOMEN: abdomen soft, non-tender and normal bowel sounds Musculoskeletal:no cyanosis of digits and no clubbing  PSYCH: alert & oriented x 3 with fluent speech NEURO: no focal motor/sensory deficits SKIN: no rashes or significant lesions LABORATORY DATA:  I have reviewed the data as listed Lab Results  Component Value Date   WBC 3.0 (L) 04/15/2017   HGB 11.4 (L) 04/15/2017   HCT 35.6 04/15/2017   MCV 94.0  04/15/2017   PLT 242 04/15/2017   Recent Labs    06/03/16 0001 07/04/16 0001  09/06/16 1049 09/16/16 0822 10/01/16 1053 04/15/17 1004  NA 140 141   < > 139 141 142 139  K 4.1 3.9   < > 4.4 4.5 4.4 3.9  CL 104 105   < > 107 107 107 103  CO2 27 25   < > _0 GLUCOSE 128* 125*   < > 139* 131* 114* 121*  BUN 17 16   < > 43* 30* 32* 23*  CREATININE 0.84 0.93   < > 1.30* 1.15* 1.07* 0.96  CALCIUM 9.2 9.1   < > 9.7 9.4 9.7 9.1  GFRNONAA 71 62   < > 41* 48*  --  59*  GFRAA 82 72   < > 47* 56*  --  >60  PROT 7.5 7.5  --   --   --   --  8.3*  ALBUMIN 3.5* 3.6  --   --   --   --  3.6  AST 32 50*  --   --   --   --  32  ALT 22 29  --   --   --   --  20  ALKPHOS 52 52  --   --   --   --  54  BILITOT 0.5 0.6  --   --   --   --  0.5   < > = values in this interval not displayed.    ASSESSMENT & PLAN:  Iron deficiency anemia due to chronic blood loss # Iron deficiency anemia-most likely secondary to AVMs of the colon [colonoscopy November 2016];last received IV venofer dec 2017 [pre-meds].   # Today Hb 11.2/iron studies- N; continue PO iron; NO IV iron today. Discussed re: Bone marrow Biopsy if worse. ? Aranesp [Creat 1.1]. Hold off BMBx for now.   # Mild-mod intermittent neutropenia- ? Ethnic benign vs others. Asymtomatic.   # Follow up in 4 months/labs prior- b12/folate/copper/ zinc/ possible venofer.       Cammie Sickle, MD 04/22/2017 1:54 PM

## 2017-04-24 ENCOUNTER — Ambulatory Visit (INDEPENDENT_AMBULATORY_CARE_PROVIDER_SITE_OTHER): Payer: Medicare Other | Admitting: Family Medicine

## 2017-04-24 ENCOUNTER — Encounter: Payer: Self-pay | Admitting: Family Medicine

## 2017-04-24 ENCOUNTER — Other Ambulatory Visit: Payer: Self-pay | Admitting: Family Medicine

## 2017-04-24 VITALS — BP 127/58 | HR 90 | Temp 98.7°F | Resp 16 | Ht 60.0 in | Wt 210.6 lb

## 2017-04-24 DIAGNOSIS — E559 Vitamin D deficiency, unspecified: Secondary | ICD-10-CM

## 2017-04-24 DIAGNOSIS — Z1159 Encounter for screening for other viral diseases: Secondary | ICD-10-CM

## 2017-04-24 DIAGNOSIS — I10 Essential (primary) hypertension: Secondary | ICD-10-CM | POA: Diagnosis not present

## 2017-04-24 DIAGNOSIS — E119 Type 2 diabetes mellitus without complications: Secondary | ICD-10-CM

## 2017-04-24 DIAGNOSIS — J302 Other seasonal allergic rhinitis: Secondary | ICD-10-CM | POA: Diagnosis not present

## 2017-04-24 DIAGNOSIS — H8101 Meniere's disease, right ear: Secondary | ICD-10-CM

## 2017-04-24 DIAGNOSIS — E7800 Pure hypercholesterolemia, unspecified: Secondary | ICD-10-CM | POA: Diagnosis not present

## 2017-04-24 DIAGNOSIS — M62838 Other muscle spasm: Secondary | ICD-10-CM | POA: Diagnosis not present

## 2017-04-24 DIAGNOSIS — Z0000 Encounter for general adult medical examination without abnormal findings: Secondary | ICD-10-CM

## 2017-04-24 DIAGNOSIS — D649 Anemia, unspecified: Secondary | ICD-10-CM

## 2017-04-24 DIAGNOSIS — D709 Neutropenia, unspecified: Secondary | ICD-10-CM | POA: Diagnosis not present

## 2017-04-24 DIAGNOSIS — D500 Iron deficiency anemia secondary to blood loss (chronic): Secondary | ICD-10-CM

## 2017-04-24 DIAGNOSIS — D6489 Other specified anemias: Secondary | ICD-10-CM

## 2017-04-24 LAB — POCT GLYCOSYLATED HEMOGLOBIN (HGB A1C): HEMOGLOBIN A1C: 6.2 — AB (ref ?–5.7)

## 2017-04-24 MED ORDER — LEVOCETIRIZINE DIHYDROCHLORIDE 5 MG PO TABS: 5.0000 mg | ORAL_TABLET | Freq: Every evening | ORAL | 3 refills | Status: DC

## 2017-04-24 MED ORDER — BACLOFEN 10 MG PO TABS: 5.0000 mg | ORAL_TABLET | Freq: Two times a day (BID) | ORAL | 1 refills | Status: DC | PRN

## 2017-04-24 MED ORDER — HYDROCHLOROTHIAZIDE 12.5 MG PO TABS: 12.5000 mg | ORAL_TABLET | Freq: Every day | ORAL | 3 refills | Status: DC

## 2017-04-24 MED ORDER — SIMVASTATIN 20 MG PO TABS: 20.0000 mg | ORAL_TABLET | Freq: Every day | ORAL | 3 refills | Status: DC

## 2017-04-24 MED ORDER — FERROUS SULFATE 325 (65 FE) MG PO TABS: 325.0000 mg | ORAL_TABLET | Freq: Two times a day (BID) | ORAL | 3 refills | Status: DC

## 2017-04-24 NOTE — Assessment & Plan Note (Addendum)
Relatively stable and asymptomatic, with chronic history of mild low WBC 3 to 4 over past 2 years Followed by Dr Rogue Bussing [facility] also for IDA

## 2017-04-24 NOTE — Assessment & Plan Note (Signed)
Improved IDA Followed by Dr Rogue Bussing [facility], on PO iron supplement, no IV iron last visit Advised to continue Ferrous sulfate 325mg  BID for now as tolerated

## 2017-04-24 NOTE — Patient Instructions (Addendum)
Thank you for coming to the clinic today.   1. A1c 6.2, keep up the great work  Lets reduce Metformin from 1000mg  twice a day (2 pills each dose) NOW can reduce to either TWO in morning or evening and ONE at other dose.. If still doing very well, then after few weeks can reduce to ONE pill TWICE a day  2.  Start taking Baclofen (Lioresal) 10mg  (muscle relaxant) - start with half (cut) to one whole pill at night as needed for next 1-3 nights (may make you drowsy, caution with driving) see how it affects you, then if tolerated increase to one pill 2 to 3 times a day or (every 8 hours as needed) OR can take one in morning  Recommend to start taking Tylenol Extra Strength (Acetaminophen) 500mg  tabs - take 1 to 2 tabs per dose (max 1000mg ) every 6-8 hours for pain (take regularly, don't skip a dose for next 7 days), max 24 hour daily dose is 6 tablets or 3000mg . In the future you can repeat the same everyday Tylenol course for 1-2 weeks at a time.   Refilled rx 90 days, including iron supplement if not covered by ins then continue OTC  3.  Last visit 08/2016 with Dr Loel Dubonnet, please call them and ask if you can have a new Cardiologist at their office, maybe ask for Dr Ida Rogue, otherwise any other cardiologist will be able to establish with you  [facility]) HeartCare at [place], [place] [address] Main: [phone]   DUE for [place] (no food or drink after midnight before the lab appointment, only water or coffee without cream/sugar on the morning of)  SCHEDULE "Lab Only" visit in the morning at the clinic for lab draw in 3 months  - Make sure Lab Only appointment is at about 1 week before your next appointment, so that results will be available  For Lab Results, once available within 2-3 days of blood draw, you can can log in to MyChart online to view your results and a brief explanation. Also, we can  discuss results at next follow-up visit.   Please schedule a Follow-up Appointment to: Return in about 3 months (around 07/23/2017) for Annual Physical.  If you have any other questions or concerns, please feel free to call the clinic or send a message through [place]. You may also schedule an earlier appointment if necessary.  Additionally, you may be receiving a survey about your experience at our clinic within a few days to 1 week by e-mail or mail. We value your feedback.  [name], DO [place]

## 2017-04-24 NOTE — Assessment & Plan Note (Addendum)
Stable, without current flare or recurrence Improved by [facility]NT Vestibular rehab Followed by Dr Richardson Landry, on HCTZ monotherapy now

## 2017-04-24 NOTE — Assessment & Plan Note (Signed)
Controlled HTN - Home BP readings normal range  Complicated by Meniere's R, Mild AS - Off Triamterene   Plan:  1. Continue current BP regimen - HCTZ 12.5mg  and Losartan 50mg  daily 2. Encourage improved lifestyle - low sodium diet, resume regular exercise 3. Continue monitor BP outside office, bring readings to next visit, if persistently >140/90 or new symptoms notify office sooner 4. Follow-up 3 months HTN  Recommend to contact [facility] [place] to re-establish with new cardiology after Dr Loel Dubonnet has left

## 2017-04-24 NOTE — Progress Notes (Signed)
Subjective:    Patient ID: Diana Harmon, female    DOB: 27-Nov-1945, 71 y.o.   MRN: [id number]  Diana Harmon is a 71 y.o. female presenting on 04/24/2017 for Diabetes (highest BS 157 and lowest 123 onset week HA during only daytime)   HPI   Neck Spasm Pain Recently waking up with headache in morning over past 1.5 week, lasting until mid afternoon then eases up and goes away until next day. Describes pain as bilateral back of neck upper occiput region, she thought she had history of a prior "muscle knot" problem in neck. She noticed it recently when trying to wash hair because if move head to neck down has worsening discomfort. - Taking Tylenol 325 mg liquid 53mL 2 to 3 times a day - History of PUD and bleeding with Aleve. Not taking muscle relaxant - Denies any numbness tingling weakness in upper extremity, vision change, frontal headache, dyspnea  CHRONIC DM, Type 2: Reports no concerns. States sugar still improved CBGs: Avg 100-120. Low none. High < 150. Checks CBGs few times weekly Meds: Metformin 500mg  x 2 tabs 1000mg  BID Reports good compliance. Tolerating well w/o side-effects Currently on ARB, ASA 81, Simvastatin 20mg  Lifestyle: - Diet (Still follows carb diet, eliminated bread in diet, less sugar in coffee) - Exercise (previously water aerobics Denies hypoglycemia, polyuria, visual changes, numbness or tingling.  Low WBC / Neutropenia Followed by Dr Rogue Bussing [facility] for iron deficiency anemia, and also concern with low WBC 3.0 on last, has had chronic trend of mild low WBC 3s to 4, in past - No new concerns, she declined bone marrow bx at previous visit, IDA is improved still on iron supplement  HTN / History of Mild Aortic Stenosis, Heart Murmur - Previously followed by Dr Loel Dubonnet at [facility], now after she left patient needs to establish with new Cardiologist, she will contact them - No new concerns, BP is controlled - Tolerating meds  Health  Maintenance: Due for routine Hep C screening, will get with upcoming labs  Depression screen [facility] 2/9 04/08/2017 05/16/2016 10/26/2015  Decreased Interest 0 0 0  Down, Depressed, Hopeless 0 0 0  PHQ - 2 Score 0 0 0    Social History   Tobacco Use  . Smoking status: Never Smoker  . Smokeless tobacco: Never Used  Substance Use Topics  . Alcohol use: Yes    Alcohol/week: 0.6 oz    Types: 1 Glasses of wine per week    Comment: occ. wine  . Drug use: No    Review of Systems Per HPI unless specifically indicated above     Objective:    BP (!) 127/58   Pulse 90   Temp 98.7 F (37.1 C) (Oral)   Resp 16   Ht 5' (1.524 m)   Wt 210 lb 9.6 oz (95.5 kg)   BMI 41.13 kg/m   Wt Readings from Last 3 Encounters:  04/24/17 210 lb 9.6 oz (95.5 kg)  04/22/17 209 lb 7 oz (95 kg)  04/08/17 210 lb 6.4 oz (95.4 kg)    Physical Exam  Constitutional: She is oriented to person, place, and time. She appears well-developed and well-nourished. No distress.  Well-appearing, comfortable, cooperative, obese  HENT:  Head: Normocephalic and atraumatic.  Mouth/Throat: Oropharynx is clear and moist.  Eyes: Conjunctivae are normal. Right eye exhibits no discharge. Left eye exhibits no discharge.  Neck:  Neck Inspection: Normal appearance Palpation: mild tender to palpation at base of occiput  with some paraspinal c spine muscle hypertonicity and some muscle spasm into trapezius muscle ROM: mostly intact but slightly reduced neck flex/ext, rotation intact Special Testing: negative Spurling's Strength: intact upper ext str Neurovascular: intact distal UE  Cardiovascular: Normal rate, regular rhythm and intact distal pulses.  Murmur (Stable (2/6 systolic murmur over R sternal border, some radiation to carotids appreciated)) heard. Pulmonary/Chest: Effort normal and breath sounds normal. No respiratory distress. She has no wheezes. She has no rales.  Musculoskeletal: Normal range of motion. She exhibits  no edema (Improved edema).  Lymphadenopathy:    She has no cervical adenopathy.  Neurological: She is alert and oriented to person, place, and time.  Skin: Skin is warm and dry. No rash noted. She is not diaphoretic. No erythema.  Psychiatric: She has a normal mood and affect. Her behavior is normal.  Well groomed, good eye contact, normal speech and thoughts  Nursing note and vitals reviewed.    Recent Labs    08/28/16 0841 01/14/17 0825 04/24/17 1125  HGBA1C 7.2 6.3* 6.2*    Results for orders placed or performed in visit on 04/24/17  POCT HgB A1C  Result Value Ref Range   Hemoglobin A1C 6.2 (A) 5.7      Assessment & Plan:   Problem List Items Addressed This Visit    Anemia due to multiple mechanisms    Improved IDA Followed by Dr Rogue Bussing [facility], on PO iron supplement, no IV iron last visit Advised to continue Ferrous sulfate 325mg  BID for now as tolerated      Relevant Medications   ferrous sulfate 325 (65 FE) MG tablet   Controlled diabetes mellitus type II without complication (H[facility]) - Primary    Well-controlled DM with A1c 6.2 from 6.3 No known complications or hypoglycemia.  Plan:  1. REDUCE Metformin from 1000mg  BID - now to 1000mg  AM / 500mg  PM (or can switch) and then gradually may try reduce to 500mg  BID only since improved lifestyle 2. Encourage improved lifestyle - low carb, low sugar diet, reduce portion size, continue improving regular exercise 3. Check CBG, bring log to next visit for review 4. Continue ASA, ARB, Statin 5. Follow-up 3 months DM A1c -then if remains controlled on lower metformin can consider space out visits      Relevant Medications   simvastatin (ZOCOR) 20 MG tablet   Other Relevant Orders   POCT HgB A1C (Completed)   Essential (primary) hypertension    Controlled HTN - Home BP readings normal range  Complicated by Meniere's R, Mild AS - Off Triamterene   Plan:  1. Continue current BP regimen - HCTZ 12.5mg  and Losartan  50mg  daily 2. Encourage improved lifestyle - low sodium diet, resume regular exercise 3. Continue monitor BP outside office, bring readings to next visit, if persistently >140/90 or new symptoms notify office sooner 4. Follow-up 3 months HTN  Recommend to contact [facility]  to re-establish with new cardiology after Dr Loel Dubonnet has left      Relevant Medications   hydrochlorothiazide (HYDRODIURIL) 12.5 MG tablet   simvastatin (ZOCOR) 20 MG tablet   Hypercholesteremia   Relevant Medications   hydrochlorothiazide (HYDRODIURIL) 12.5 MG tablet   simvastatin (ZOCOR) 20 MG tablet   Meniere's disease of right ear    Stable, without current flare or recurrence Improved by [facility] Vest[facility] rehab Followed by Dr Richardson Landry, on HCTZ monotherapy now      Relevant Medications   hydrochlorothiazide (HYDRODIURIL) 12.5 MG tablet   Neutropenia (  [place])    Relatively stable and asymptomatic, with chronic history of mild low WBC 3 to 4 over past 2 years Followed by Dr Rogue Bussing [facility] also for IDA      Seasonal allergies   Relevant Medications   levocetirizine (XYZAL) 5 MG tablet    Other Visit Diagnoses    Muscle spasms of neck      Persistent bilateral cervical paraspinal muscle spasm without specific etiology, has prior history of this, likely positional provoked with sleeping also can be postural - No worsening or significant improvement. Without complications. No evidence of radicular symptoms or neurological deficits or weakness. - Inadequate conservative treatments at home'  Plan 1. Trial baclofen PRN, caution sedation reduce dose if need 2. Inc Tylenol may use OTC 500mg  up to 1g TID PRN 3. Avoid NSAIDs history of PUD 4. Demonstrated suboccipital release soft tissue technique 5. Follow-up if not improved     Relevant Medications   baclofen (LIORESAL) 10 MG tablet      Meds ordered this encounter  Medications  . baclofen (LIORESAL) 10 MG tablet    Sig: Take  0.5-1 tablets (5-10 mg total) by mouth 2 (two) times daily as needed for muscle spasms.    Dispense:  30 each    Refill:  1  . hydrochlorothiazide (HYDRODIURIL) 12.5 MG tablet    Sig: Take 1 tablet (12.5 mg total) by mouth daily.    Dispense:  90 tablet    Refill:  3  . simvastatin (ZOCOR) 20 MG tablet    Sig: Take 1 tablet (20 mg total) by mouth daily at 6 PM.    Dispense:  90 tablet    Refill:  3  . levocetirizine (XYZAL) 5 MG tablet    Sig: Take 1 tablet (5 mg total) by mouth every evening.    Dispense:  90 tablet    Refill:  3  . ferrous sulfate 325 (65 FE) MG tablet    Sig: Take 1 tablet (325 mg total) by mouth 2 (two) times daily with a meal.    Dispense:  180 tablet    Refill:  3    Follow up plan: Return in about 3 months (around 07/23/2017) for Annual Physical.  Future labs ordered for 07/21/17  [name], [facility] 04/24/2017, 3:35 PM

## 2017-04-24 NOTE — Assessment & Plan Note (Signed)
Well-controlled DM with A1c 6.2 from 6.3 No known complications or hypoglycemia.  Plan:  1. REDUCE Metformin from 1000mg  BID - now to 1000mg  AM / 500mg  PM (or can switch) and then gradually may try reduce to 500mg  BID only since improved lifestyle 2. Encourage improved lifestyle - low carb, low sugar diet, reduce portion size, continue improving regular exercise 3. Check CBG, bring log to next visit for review 4. Continue ASA, ARB, Statin 5. Follow-up 3 months DM A1c -then if remains controlled on lower metformin can consider space out visits

## 2017-06-18 ENCOUNTER — Encounter: Payer: Self-pay | Admitting: Family Medicine

## 2017-06-18 DIAGNOSIS — E113393 Type 2 diabetes mellitus with moderate nonproliferative diabetic retinopathy without macular edema, bilateral: Secondary | ICD-10-CM | POA: Diagnosis not present

## 2017-06-18 LAB — HM DIABETES EYE EXAM

## 2017-07-18 ENCOUNTER — Other Ambulatory Visit: Payer: Self-pay

## 2017-07-18 DIAGNOSIS — I10 Essential (primary) hypertension: Secondary | ICD-10-CM

## 2017-07-18 DIAGNOSIS — Z0000 Encounter for general adult medical examination without abnormal findings: Secondary | ICD-10-CM

## 2017-07-18 DIAGNOSIS — E119 Type 2 diabetes mellitus without complications: Secondary | ICD-10-CM

## 2017-07-18 DIAGNOSIS — D500 Iron deficiency anemia secondary to blood loss (chronic): Secondary | ICD-10-CM

## 2017-07-18 DIAGNOSIS — E7800 Pure hypercholesterolemia, unspecified: Secondary | ICD-10-CM

## 2017-07-18 DIAGNOSIS — Z1159 Encounter for screening for other viral diseases: Secondary | ICD-10-CM

## 2017-07-18 DIAGNOSIS — E559 Vitamin D deficiency, unspecified: Secondary | ICD-10-CM

## 2017-07-18 DIAGNOSIS — D709 Neutropenia, unspecified: Secondary | ICD-10-CM

## 2017-07-21 ENCOUNTER — Other Ambulatory Visit: Payer: Medicare Other

## 2017-07-21 DIAGNOSIS — E119 Type 2 diabetes mellitus without complications: Secondary | ICD-10-CM | POA: Diagnosis not present

## 2017-07-21 DIAGNOSIS — I10 Essential (primary) hypertension: Secondary | ICD-10-CM | POA: Diagnosis not present

## 2017-07-21 DIAGNOSIS — E7800 Pure hypercholesterolemia, unspecified: Secondary | ICD-10-CM | POA: Diagnosis not present

## 2017-07-21 DIAGNOSIS — Z1159 Encounter for screening for other viral diseases: Secondary | ICD-10-CM | POA: Diagnosis not present

## 2017-07-21 DIAGNOSIS — E559 Vitamin D deficiency, unspecified: Secondary | ICD-10-CM | POA: Diagnosis not present

## 2017-07-22 LAB — COMPLETE METABOLIC PANEL WITH GFR
AG RATIO: 1 (calc) (ref 1.0–2.5)
ALKALINE PHOSPHATASE (APISO): 44 U/L (ref 33–130)
ALT: 19 U/L (ref 6–29)
AST: 33 U/L (ref 10–35)
Albumin: 3.7 g/dL (ref 3.6–5.1)
BUN: 18 mg/dL (ref 7–25)
CO2: 28 mmol/L (ref 20–32)
Calcium: 9.1 mg/dL (ref 8.6–10.4)
Chloride: 106 mmol/L (ref 98–110)
Creat: 0.91 mg/dL (ref 0.60–0.93)
GFR, Est African American: 74 mL/min/{1.73_m2} (ref >=60)
GFR, Est Non African American: 63 mL/min/{1.73_m2} (ref >=60)
Globulin: 3.6 g/dL (calc) (ref 1.9–3.7)
Glucose, Bld: 150 mg/dL — ABNORMAL HIGH (ref 65–99)
POTASSIUM: 3.9 mmol/L (ref 3.5–5.3)
SODIUM: 142 mmol/L (ref 135–146)
Total Bilirubin: 0.4 mg/dL (ref 0.2–1.2)
Total Protein: 7.3 g/dL (ref 6.1–8.1)

## 2017-07-22 LAB — CBC WITH DIFFERENTIAL/PLATELET
BASOS ABS: 19 {cells}/uL (ref 0–200)
BASOS PCT: 0.8 %
EOS ABS: 98 {cells}/uL (ref 15–500)
Eosinophils Relative: 4.1 %
HEMATOCRIT: 32.5 % — AB (ref 35.0–45.0)
HEMOGLOBIN: 10.6 g/dL — AB (ref 11.7–15.5)
LYMPHS ABS: 1130 {cells}/uL (ref 850–3900)
MCH: 29.7 pg (ref 27.0–33.0)
MCHC: 32.6 g/dL (ref 32.0–36.0)
MCV: 91 fL (ref 80.0–100.0)
MPV: 10.2 fL (ref 7.5–12.5)
Monocytes Relative: 13.1 %
NEUTROS ABS: 838 {cells}/uL — AB (ref 1500–7800)
Neutrophils Relative %: 34.9 %
Platelets: 228 10*3/uL (ref 140–400)
RBC: 3.57 10*6/uL — ABNORMAL LOW (ref 3.80–5.10)
RDW: 12.4 % (ref 11.0–15.0)
Total Lymphocyte: 47.1 %
WBC: 2.4 10*3/uL — ABNORMAL LOW (ref 3.8–10.8)
WBCMIX: 314 {cells}/uL (ref 200–950)

## 2017-07-22 LAB — HEMOGLOBIN A1C
Hgb A1c MFr Bld: 6.3 % of total Hgb — ABNORMAL HIGH (ref <5.7)
Mean Plasma Glucose: 134 (calc)
eAG (mmol/L): 7.4 (calc)

## 2017-07-22 LAB — LIPID PANEL
CHOL/HDL RATIO: 2.4 (calc) (ref <5.0)
Cholesterol: 163 mg/dL (ref <200)
HDL: 68 mg/dL (ref >50)
LDL CHOLESTEROL (CALC): 77 mg/dL
NON-HDL CHOLESTEROL (CALC): 95 mg/dL (ref <130)
Triglycerides: 95 mg/dL (ref <150)

## 2017-07-22 LAB — VITAMIN D 25 HYDROXY (VIT D DEFICIENCY, FRACTURES): Vit D, 25-Hydroxy: 53 ng/mL (ref 30–100)

## 2017-07-22 LAB — HEPATITIS C ANTIBODY
HEP C AB: NONREACTIVE
SIGNAL TO CUT-OFF: 0.02 (ref <1.00)

## 2017-07-24 ENCOUNTER — Encounter: Payer: Self-pay | Admitting: Family Medicine

## 2017-07-24 ENCOUNTER — Ambulatory Visit (INDEPENDENT_AMBULATORY_CARE_PROVIDER_SITE_OTHER): Payer: Medicare Other | Admitting: Family Medicine

## 2017-07-24 VITALS — BP 137/74 | HR 80 | Temp 98.5°F | Resp 16 | Ht 60.0 in | Wt 214.0 lb

## 2017-07-24 DIAGNOSIS — D709 Neutropenia, unspecified: Secondary | ICD-10-CM | POA: Diagnosis not present

## 2017-07-24 DIAGNOSIS — M62838 Other muscle spasm: Secondary | ICD-10-CM | POA: Diagnosis not present

## 2017-07-24 DIAGNOSIS — E559 Vitamin D deficiency, unspecified: Secondary | ICD-10-CM | POA: Diagnosis not present

## 2017-07-24 DIAGNOSIS — Z0000 Encounter for general adult medical examination without abnormal findings: Secondary | ICD-10-CM

## 2017-07-24 DIAGNOSIS — D500 Iron deficiency anemia secondary to blood loss (chronic): Secondary | ICD-10-CM

## 2017-07-24 DIAGNOSIS — N182 Chronic kidney disease, stage 2 (mild): Secondary | ICD-10-CM | POA: Diagnosis not present

## 2017-07-24 DIAGNOSIS — E119 Type 2 diabetes mellitus without complications: Secondary | ICD-10-CM

## 2017-07-24 DIAGNOSIS — I10 Essential (primary) hypertension: Secondary | ICD-10-CM

## 2017-07-24 DIAGNOSIS — Z6841 Body Mass Index (BMI) 40.0 and over, adult: Secondary | ICD-10-CM | POA: Diagnosis not present

## 2017-07-24 MED ORDER — BACLOFEN 10 MG PO TABS: 10.0000 mg | ORAL_TABLET | Freq: Two times a day (BID) | ORAL | 1 refills | Status: DC | PRN

## 2017-07-24 NOTE — Assessment & Plan Note (Signed)
Followed by [facility] Dr Rogue Bussing Still further reduced WBC to 2.4 from 3 In past she had discussed bone marrow bx, will defer further diagnostic / treatment to Heme/Onc Follow-up as scheduled

## 2017-07-24 NOTE — Assessment & Plan Note (Signed)
Stable, improved Cr Mild CKD-II Prior with elevated Cr in past Improved hydration Secondary to age, HTN, DM Follow-up

## 2017-07-24 NOTE — Assessment & Plan Note (Signed)
Well-controlled DM with A1c 6.2 from 6.3 No known complications or hypoglycemia.  Plan:  1. Continue Metformin from 1000mg  BID - now to 1000mg  AM / 500mg  PM. In future if sugar improved and wt loss, or if start new agent GLP1 as discussed - then gradually may try reduce to 500mg  BID only or possibly DC - Handout with GLP1 names to consider, review, call to check coverage, goal would be for maintain sugar and wt loss 2. Encourage improved lifestyle - low carb, low sugar diet, reduce portion size, continue improving regular exercise 3. Check CBG, bring log to next visit for review 4. Continue ASA, ARB, Statin 5. Follow-up 6 month DM A1c

## 2017-07-24 NOTE — Patient Instructions (Signed)
Thank you for coming to the office today.  Likely continue metformin for now, and may add one of these, if need better sugar control or wt loss  Call insurance find cost and coverage of the following  1. Ozempic (Semaglutide injection) - start 0.25mg  weekly for 4 weeks then increase to 0.5mg  weekly - This one has best benefit of weight loss and reducing Cardiovascular events  2. Bydureon BCise (Exenatide ER) - once weekly - this is my preference, very good medicine well tolerated, less side effects of nausea, upset stomach. No dose changes. Cost and coverage is the problem, but we may be able to get it with the coupon card  3. Trulicity (Dulaglutide) - once weekly - this is very good one, usually one of my top choices as well, two doses, 0.75 (likely we would start) and 1.5 max dose. We can use coupon card here too  4. Victoza (Liraglutide) - once DAILY - 3 dose changes 0.6, 1.2 and 1.8, side effects nausea, upset stomach higher on this one but it is still very effective medicine  ------------- Cyst on Right mid back - deeper, previous scar tissue. If worsening swelling pain pressure red concern infection, may contact office and we can refer to General Surgery  Refilled Baclofen - may try higher dose 1 pill twice daily or 2 pills daily  Please schedule a Follow-up Appointment to: Return in about 6 months (around 01/24/2018) for DM A1c, Weight Check, HTN.  If you have any other questions or concerns, please feel free to call the office or send a message through [place]. You may also schedule an earlier appointment if necessary.  Additionally, you may be receiving a survey about your experience at our office within a few days to 1 week by e-mail or mail. We value your feedback.  [name], DO [place]

## 2017-07-24 NOTE — Progress Notes (Signed)
Subjective:    Patient ID: Diana Harmon, female    DOB: 30-Sep-1945, 72 y.o.   MRN: [id number]  Diana Harmon is a 72 y.o. female presenting on 07/24/2017 for Annual Exam and Diabetes   HPI   HYPERLIPIDEMIA / MORBID OBESITY BMI >41 - Reports concern wt gain 4 lbs since last visit, attributed to inc portion size living with daughter better cooking. Last lipid panel 07/2017, controlled  - Currently taking Simvastatin 76m, tolerating well without side effects or myalgias  FOLLOW-UP Neck Spasm Pain Last seen 04/2017 for same issue, given rx Baclofen PRN took 1 tab without any significant relief, without side effects or other concerns.  CHRONIC DM, Type 2: Reports no concerns. [facility] with A1c 5.6 recently CBGs: Avg 100-120.Low none. High < 150.Checks CBGs few times weekly Meds: Metformin 5038mx 2 tabs in AM and 1 tab in PM Reports good compliance. Tolerating well w/o side-effects Currently on ARB, ASA 81, Simvastatin 2074mifestyle: -Diet (Still follows carb diet, eliminated bread in diet, less sugar in coffee) -Exercise (some water aerobics) - Followed by Dr PhiLorie Apley/13/19) Ophtho had eye exam already Denies hypoglycemia  CKD-II Improved Cr 0.91, from prior 1.3 in past, had some concern for possible CKD-III but seems improved Improved water intake now better hydration  Low WBC / Neutropenia [facility] deficiency anemia, and Neutropenia, has been followed for this with some recent lowering WBC count on last result WBC down to 2.4 - No new concerns, she declined bone marrow bx at previous visit, IDA is improved still on iron supplement - She will follow-up with them as scheduled  HTN / History of Mild Aortic Stenosis, Heart Murmur - [facility]el [facility]net [facility] - She has not re-scheduled with Cardiology yet - Taking HCTZ 12.5mg50mily, Losartan 50mg50mly - Tolerating  meds - ASA 81 mg daily  Cyst, on Back Prior history of deeper sebaceous cyst R mid to upper back, had it surgically removed years ago at [facility] now reports some feeling of pressure and some pains in this area, thinks it may be returning - No fevers chills or redness or drainage  Health Maintenance: UTD Hep C screening, negative UTD DEXA 2016 UTD PNA vaccine UTD Flu vaccine this season UTD Colonoscopy 2016 UTD Mammogram 92018   [facility] 05/16/2016 10/26/2015  Decreased Interest 0 0 0  Down, Depressed, Hopeless 0 0 0  PHQ - 2 Score 0 0 0    Past Medical History:  Diagnosis Date  . Anemia   . Benign tumor   . Diabetes mellitus without complication (HCC) [place] Hyperlipidemia   . Hypertension   . Nausea   . Patient is [name]   . Thyroid disease    Past Surgical History:  Procedure Laterality Date  . COLONOSCOPY    . COLONOSCOPY WITH PROPOFOL N/A 03/13/2015   Procedure: COLONOSCOPY WITH PROPOFOL;  Surgeon: RoberManya Silvas  [facility];  Service: Endoscopy;  Laterality: N/A;  . ESOPHAGOGASTRODUODENOSCOPY N/A 03/13/2015   Procedure: ESOPHAGOGASTRODUODENOSCOPY (EGD);  Surgeon: RoberManya Silvas  [facility];  Service: Endoscopy;  Laterality: N/A;  . THYROIDECTOMY    . tumor removed     Social History   Socioeconomic History  . Marital status: Divorced    Spouse name: Not on file  . Number of children: Not on file  . Years  of education: 62  . Highest education level: 12th grade  Occupational History  . Not on file  Social Needs  . Financial resource strain: Not hard at all  . Food insecurity:    Worry: Sometimes true    Inability: Never true  . Transportation needs:    Medical: No    Non-medical: No  Tobacco Use  . Smoking status: Never Smoker  . Smokeless tobacco: Never Used  Substance and Sexual Activity  . Alcohol use: Yes    Alcohol/week: 0.6 oz    Types: 1 Glasses of wine per week     Comment: occ. wine  . Drug use: No  . Sexual activity: Not on file  Lifestyle  . Physical activity:    Days per week: 2 days    Minutes per session: 60 min  . Stress: Not at all  Relationships  . Social connections:    Talks on phone: More than three times a week    Gets together: Never    Attends religious service: More than 4 times per year    Active member of club or organization: No    Attends meetings of clubs or organizations: Never    Relationship status: Divorced  . Intimate partner violence:    Fear of current or ex partner: No    Emotionally abused: No    Physically abused: No    Forced sexual activity: No  Other Topics Concern  . Not on file  Social History Narrative  . Not on file   Family History  Problem Relation Age of Onset  . Parkinson's disease Mother   . Heart disease Father   . Lupus Sister   . Aneurysm Son   . Lupus Son    Current Outpatient Medications on File Prior to Visit  Medication Sig  . acetaminophen (TYLENOL) 500 MG tablet Take 1,000 mg by mouth every 6 (six) hours as needed for moderate pain.  Marland Kitchen aspirin EC 81 MG tablet Take 1 tablet (81 mg total) by mouth daily.  . Blood Glucose Monitoring Suppl (ACCU-CHEK NANO SMARTVIEW) w/Device KIT Use to check blood glucose up to once daily as instructed  . cholecalciferol (VITAMIN D) 1000 units tablet Take 5,000 Units by mouth daily.  . Cromolyn Sodium (NASAL ALLERGY NA) Place 1 spray into the nose daily as needed (allergies).  . diclofenac sodium (VOLTAREN) 1 % GEL Apply 2 g topically 3 (three) times daily as needed. On affected knee joints  . ferrous sulfate 325 (65 FE) MG tablet Take 1 tablet (325 mg total) by mouth 2 (two) times daily with a meal.  . ferrous sulfate 325 (65 FE) MG tablet Take by mouth.  Marland Kitchen glucose blood (ACCU-CHEK SMARTVIEW) test strip Check blood glucose up to once daily as instructed  . hydrochlorothiazide (HYDRODIURIL) 12.5 MG tablet Take 1 tablet (12.5 mg total) by mouth daily.    Marland Kitchen lactobacillus acidophilus (BACID) TABS tablet Take 2 tablets by mouth 2 (two) times daily.  Marland Kitchen levocetirizine (XYZAL) 5 MG tablet Take 1 tablet (5 mg total) by mouth every evening.  . loratadine (CLARITIN) 10 MG tablet Take 10 mg by mouth daily.  Marland Kitchen losartan (COZAAR) 50 MG tablet Take 1 tablet (50 mg total) by mouth daily.  . metFORMIN (GLUCOPHAGE) 500 MG tablet Take 2 tablets (1,000 mg total) by mouth 2 (two) times daily with a meal.  . simvastatin (ZOCOR) 20 MG tablet Take 1 tablet (20 mg total) by mouth daily at 6 PM.  No current facility-administered medications on file prior to visit.     Review of Systems  Constitutional: Negative for activity change, appetite change, chills, diaphoresis, fatigue and fever.  HENT: Negative for congestion and hearing loss.   Eyes: Negative for visual disturbance.  Respiratory: Negative for apnea, cough, choking, chest tightness, shortness of breath and wheezing.   Cardiovascular: Negative for chest pain, palpitations and leg swelling.  Gastrointestinal: Negative for abdominal pain, constipation, diarrhea, nausea and vomiting.  Endocrine: Negative for cold intolerance and polyuria.  Genitourinary: Negative for decreased urine volume, difficulty urinating, dyspareunia, dysuria, frequency, hematuria, pelvic pain and urgency.  Musculoskeletal: Positive for neck pain. Negative for arthralgias and back pain.  Skin: Negative for rash.  Allergic/Immunologic: Negative for environmental allergies.  Neurological: Negative for dizziness, weakness, light-headedness, numbness and headaches.  Hematological: Negative for adenopathy.  Psychiatric/Behavioral: Negative for behavioral problems, dysphoric mood and sleep disturbance. The patient is not nervous/anxious.    Per HPI unless specifically indicated above     Objective:    BP 137/74   Pulse 80   Temp 98.5 F (36.9 C) (Oral)   Resp 16   Ht 5' (1.524 m)   Wt 214 lb (97.1 kg)   BMI 41.79 kg/m   Wt  Readings from Last 3 Encounters:  07/24/17 214 lb (97.1 kg)  04/24/17 210 lb 9.6 oz (95.5 kg)  04/22/17 209 lb 7 oz (95 kg)    Physical Exam  Constitutional: She is oriented to person, place, and time. She appears well-developed and well-nourished. No distress.  Well-appearing, comfortable, cooperative, obese  HENT:  Head: Normocephalic and atraumatic.  Mouth/Throat: Oropharynx is clear and moist.  Eyes: Pupils are equal, round, and reactive to light. Conjunctivae and EOM are normal. Right eye exhibits no discharge. Left eye exhibits no discharge.  Neck: Normal range of motion. Neck supple. No thyromegaly present.  Cardiovascular: Normal rate, regular rhythm, normal heart sounds and intact distal pulses.  No murmur heard. Pulmonary/Chest: Effort normal and breath sounds normal. No respiratory distress. She has no wheezes. She has no rales.  Abdominal: Soft. Bowel sounds are normal. She exhibits no distension and no mass. There is no tenderness.  Musculoskeletal: Normal range of motion. She exhibits edema (trace non pitting LE edema, symmetrical). She exhibits no tenderness.  Upper / Lower Extremities: - Normal muscle tone, strength bilateral upper extremities 5/5, lower extremities 5/5  Lymphadenopathy:    She has no cervical adenopathy.  Neurological: She is alert and oriented to person, place, and time.  Distal sensation intact to light touch all extremities  Skin: Skin is warm and dry. No rash noted. She is not diaphoretic. No erythema.  Psychiatric: She has a normal mood and affect. Her behavior is normal.  Well groomed, good eye contact, normal speech and thoughts  Nursing note and vitals reviewed.    Recent Labs    01/14/17 0825 04/24/17 1125 07/21/17 0000  HGBA1C 6.3* 6.2* 6.3*    Results for orders placed or performed in visit on 07/18/17  Hepatitis C antibody  Result Value Ref Range   Hepatitis C Ab NON-REACTIVE NON-REACTI   SIGNAL TO CUT-OFF 0.02 <1.00  VITAMIN D  25 Hydroxy (Vit-D Deficiency, Fractures)  Result Value Ref Range   Vit D, 25-Hydroxy 53 30 - 100 ng/mL  Hemoglobin A1c  Result Value Ref Range   Hgb A1c MFr Bld 6.3 (H) <5.7 % of total Hgb   Mean Plasma Glucose 134 (calc)   eAG (mmol/L) 7.4 (calc)  CBC  with Differential/Platelet  Result Value Ref Range   WBC 2.4 (L) 3.8 - 10.8 Thousand/uL   RBC 3.57 (L) 3.80 - 5.10 Million/uL   Hemoglobin 10.6 (L) 11.7 - 15.5 g/dL   HCT 32.5 (L) 35.0 - 45.0 %   MCV 91.0 80.0 - 100.0 fL   MCH 29.7 27.0 - 33.0 pg   MCHC 32.6 32.0 - 36.0 g/dL   RDW 12.4 11.0 - 15.0 %   Platelets 228 140 - 400 Thousand/uL   MPV 10.2 7.5 - 12.5 fL   Neutro Abs 838 (L) 1,500 - 7,800 cells/uL   Lymphs Abs 1,130 850 - 3,900 cells/uL   WBC mixed population 314 200 - 950 cells/uL   Eosinophils Absolute 98 15 - 500 cells/uL   Basophils Absolute 19 0 - 200 cells/uL   Neutrophils Relative % 34.9 %   Total Lymphocyte 47.1 %   Monocytes Relative 13.1 %   Eosinophils Relative 4.1 %   Basophils Relative 0.8 %  Lipid panel  Result Value Ref Range   Cholesterol 163 <200 mg/dL   HDL 68 >50 mg/dL   Triglycerides 95 <150 mg/dL   LDL Cholesterol (Calc) 77 mg/dL (calc)   Total CHOL/HDL Ratio 2.4 <5.0 (calc)   Non-HDL Cholesterol (Calc) 95 <130 mg/dL (calc)  COMPLETE METABOLIC PANEL WITH GFR  Result Value Ref Range   Glucose, Bld 150 (H) 65 - 99 mg/dL   BUN 18 7 - 25 mg/dL   Creat 0.91 0.60 - 0.93 mg/dL   GFR, Est Non African American 63 > OR = 60 mL/min/1.75m   GFR, Est African American 74 > OR = 60 mL/min/1.738m  BUN/Creatinine Ratio NOT APPLICABLE 6 - 22 (calc)   Sodium 142 135 - 146 mmol/L   Potassium 3.9 3.5 - 5.3 mmol/L   Chloride 106 98 - 110 mmol/L   CO2 28 20 - 32 mmol/L   Calcium 9.1 8.6 - 10.4 mg/dL   Total Protein 7.3 6.1 - 8.1 g/dL   Albumin 3.7 3.6 - 5.1 g/dL   Globulin 3.6 1.9 - 3.7 g/dL (calc)   AG Ratio 1.0 1.0 - 2.5 (calc)   Total Bilirubin 0.4 0.2 - 1.2 mg/dL   Alkaline phosphatase (APISO) 44 33 -  130 U/L   AST 33 10 - 35 U/L   ALT 19 6 - 29 U/L      Assessment & Plan:   Problem List Items Addressed This Visit    CKD (chronic kidney disease), stage II    Stable, improved Cr Mild CKD-II Prior with elevated Cr in past Improved hydration Secondary to age, HTN, DM Follow-up      Cont[place] diabetes mellitus [place] complication [place]   Well-controlled DM with A1c 6.2 from 6.3 No known complications or hypoglycemia.  Plan:  1. Continue Metformin from 100064mID - now to 1000m59m / 500mg75m In future if sugar improved and wt loss, or if start new agent GLP1 as discussed - then gradually may try reduce to 500mg 79monly or possibly DC - Handout with GLP1 names to consider, review, call to check coverage, goal would be for maintain sugar and wt loss 2. Encourage improved lifestyle - low carb, low sugar diet, reduce portion size, continue improving regular exercise 3. Check CBG, bring log to next visit for review 4. Continue ASA, ARB, Statin 5. Follow-up 6 month DM A1c      Essential (primary) hypertension    Controlled HTN - Home BP readings  normal range  Complicated by Meniere's R, Mild AS, CKD-II - Off Triamterene   Plan:  1. Continue current BP regimen - HCTZ 12.35m and Losartan 521mdaily 2. Encourage improved lifestyle - low sodium diet, resume regular exercise 3. Continue monitor BP outside office, bring readings to next visit, if persistently >140/90 or new symptoms notify office sooner 4. Follow-up 6 months HTN  [facility] [place] [facility]-establish with new cardiology after Dr InLoel Dubonnetas left      Iron deficiency anemia due to chronic blood loss    [facility] BrRogue Bussinggb similar to previous, slightly reduced along with low WBC Follow-up as scheduled      Relevant Medications   ferrous sulfate 325 (65 FE) MG tablet   Morbid obesity with BMI of 40.0-44.9, adult (HCC)    Concern with 4 lb wt gain since last  visit Attributed to inc portion size lifestyle diet non adherence always but eating healthier options Consider add new GLP1 in future for wt loss and DM management Encourage improve healthy diet exercise lifestyle      Neutropenia (HCC)    [facility] BrRogue Bussingtill further reduced WBC to 2.4 from 3 In past she had discussed bone marrow bx, will defer further diagnostic / treatment to Heme/Onc Follow-up as scheduled      Vitamin D deficiency    Resolved Continue Vit D3 1,000 to 2,000 iu daily maintenance       Other Visit Diagnoses    Annual physical exam    -  Primary UTD Health maintenance Reviewed labs Encourage improve diet exercise lifestyle, w/ wt loss goals    Muscle spasms of neck     Seems stable from prior visit, limited improvement on 1 dose daily Advised she may take 1 pill twice daily PRN or may take 2 pills at once daily max dose Continue conservative care, follow-up    Relevant Medications   baclofen (LIORESAL) 10 MG tablet      Meds ordered this encounter  Medications  . baclofen (LIORESAL) 10 MG tablet    Sig: Take 1-2 tablets (10-20 mg total) by mouth 2 (two) times daily as needed for muscle spasms.    Dispense:  60 each    Refill:  1    Follow up plan: Return in about 6 months (around 01/24/2018) for DM A1c, Weight Check, HTN.  [facility] 07/24/2017, 3:20 PM

## 2017-07-24 NOTE — Assessment & Plan Note (Signed)
Controlled HTN - Home BP readings normal range  Complicated by Meniere's R, Mild AS, CKD-II - Off Triamterene   Plan:  1. Continue current BP regimen - HCTZ 12.5mg  and Losartan 50mg  daily 2. Encourage improved lifestyle - low sodium diet, resume regular exercise 3. Continue monitor BP outside office, bring readings to next visit, if persistently >140/90 or new symptoms notify office sooner 4. Follow-up 6 months HTN  Recommend to contact [facility] [place] to re-establish with new cardiology after Dr Loel Dubonnet has left

## 2017-07-24 NOTE — Assessment & Plan Note (Signed)
Concern with 4 lb wt gain since last visit Attributed to inc portion size lifestyle diet non adherence always but eating healthier options Consider add new GLP1 in future for wt loss and DM management Encourage improve healthy diet exercise lifestyle

## 2017-07-24 NOTE — Assessment & Plan Note (Signed)
Followed by [facility] Dr Rogue Bussing Hgb similar to previous, slightly reduced along with low WBC Follow-up as scheduled

## 2017-07-24 NOTE — Assessment & Plan Note (Signed)
Resolved Continue Vit D3 1,000 to 2,000 iu daily maintenance

## 2017-08-12 ENCOUNTER — Inpatient Hospital Stay: Payer: Medicare Other | Attending: Internal Medicine

## 2017-08-12 DIAGNOSIS — Z7982 Long term (current) use of aspirin: Secondary | ICD-10-CM | POA: Insufficient documentation

## 2017-08-12 DIAGNOSIS — E785 Hyperlipidemia, unspecified: Secondary | ICD-10-CM | POA: Diagnosis not present

## 2017-08-12 DIAGNOSIS — I10 Essential (primary) hypertension: Secondary | ICD-10-CM | POA: Diagnosis not present

## 2017-08-12 DIAGNOSIS — D709 Neutropenia, unspecified: Secondary | ICD-10-CM | POA: Insufficient documentation

## 2017-08-12 DIAGNOSIS — Z85048 Personal history of other malignant neoplasm of rectum, rectosigmoid junction, and anus: Secondary | ICD-10-CM | POA: Diagnosis not present

## 2017-08-12 DIAGNOSIS — Z7984 Long term (current) use of oral hypoglycemic drugs: Secondary | ICD-10-CM | POA: Diagnosis not present

## 2017-08-12 DIAGNOSIS — E890 Postprocedural hypothyroidism: Secondary | ICD-10-CM | POA: Diagnosis not present

## 2017-08-12 DIAGNOSIS — E119 Type 2 diabetes mellitus without complications: Secondary | ICD-10-CM | POA: Insufficient documentation

## 2017-08-12 DIAGNOSIS — D500 Iron deficiency anemia secondary to blood loss (chronic): Secondary | ICD-10-CM | POA: Diagnosis not present

## 2017-08-12 DIAGNOSIS — D72819 Decreased white blood cell count, unspecified: Secondary | ICD-10-CM | POA: Diagnosis not present

## 2017-08-12 DIAGNOSIS — Z79899 Other long term (current) drug therapy: Secondary | ICD-10-CM | POA: Diagnosis not present

## 2017-08-12 DIAGNOSIS — D649 Anemia, unspecified: Secondary | ICD-10-CM

## 2017-08-12 LAB — BASIC METABOLIC PANEL
Anion gap: 8 (ref 5–15)
BUN: 18 mg/dL (ref 6–20)
CHLORIDE: 103 mmol/L (ref 101–111)
CO2: 29 mmol/L (ref 22–32)
Calcium: 9 mg/dL (ref 8.9–10.3)
Creatinine, Ser: 0.84 mg/dL (ref 0.44–1.00)
GFR calc non Af Amer: >60 mL/min (ref >60)
Glucose, Bld: 88 mg/dL (ref 65–99)
POTASSIUM: 3.7 mmol/L (ref 3.5–5.1)
SODIUM: 140 mmol/L (ref 135–145)

## 2017-08-12 LAB — VITAMIN B12: VITAMIN B 12: 255 pg/mL (ref 180–914)

## 2017-08-12 LAB — FOLATE: Folate: 13.2 ng/mL (ref >5.9)

## 2017-08-12 LAB — IRON AND TIBC
Iron: 58 ug/dL (ref 28–170)
SATURATION RATIOS: 19 % (ref 10.4–31.8)
TIBC: 313 ug/dL (ref 250–450)
UIBC: 255 ug/dL

## 2017-08-12 LAB — CBC WITH DIFFERENTIAL/PLATELET
BASOS PCT: 1 %
Basophils Absolute: 0 10*3/uL (ref 0–0.1)
EOS ABS: 0.1 10*3/uL (ref 0–0.7)
Eosinophils Relative: 4 %
HCT: 33.4 % — ABNORMAL LOW (ref 35.0–47.0)
HEMOGLOBIN: 11.1 g/dL — AB (ref 12.0–16.0)
LYMPHS ABS: 1.3 10*3/uL (ref 1.0–3.6)
Lymphocytes Relative: 43 %
MCH: 30.4 pg (ref 26.0–34.0)
MCHC: 33.1 g/dL (ref 32.0–36.0)
MCV: 92.1 fL (ref 80.0–100.0)
Monocytes Absolute: 0.4 10*3/uL (ref 0.2–0.9)
Monocytes Relative: 12 %
NEUTROS ABS: 1.2 10*3/uL — AB (ref 1.4–6.5)
NEUTROS PCT: 40 %
Platelets: 239 10*3/uL (ref 150–440)
RBC: 3.63 MIL/uL — ABNORMAL LOW (ref 3.80–5.20)
RDW: 13.4 % (ref 11.5–14.5)
WBC: 3 10*3/uL — AB (ref 3.6–11.0)

## 2017-08-12 LAB — FERRITIN: Ferritin: 226 ng/mL (ref 11–307)

## 2017-08-12 LAB — LACTATE DEHYDROGENASE: LDH: 123 U/L (ref 98–192)

## 2017-08-14 LAB — COPPER, SERUM: Copper: 99 ug/dL (ref 72–166)

## 2017-08-14 LAB — ZINC: Zinc: 65 ug/dL (ref 56–134)

## 2017-08-19 ENCOUNTER — Encounter: Payer: Self-pay | Admitting: Internal Medicine

## 2017-08-19 ENCOUNTER — Inpatient Hospital Stay: Payer: Medicare Other

## 2017-08-19 ENCOUNTER — Inpatient Hospital Stay (HOSPITAL_BASED_OUTPATIENT_CLINIC_OR_DEPARTMENT_OTHER): Payer: Medicare Other | Admitting: Internal Medicine

## 2017-08-19 VITALS — BP 151/80 | HR 82 | Temp 96.1°F | Resp 16 | Wt 217.3 lb

## 2017-08-19 DIAGNOSIS — Z79899 Other long term (current) drug therapy: Secondary | ICD-10-CM

## 2017-08-19 DIAGNOSIS — Z7984 Long term (current) use of oral hypoglycemic drugs: Secondary | ICD-10-CM

## 2017-08-19 DIAGNOSIS — Z7982 Long term (current) use of aspirin: Secondary | ICD-10-CM | POA: Diagnosis not present

## 2017-08-19 DIAGNOSIS — I10 Essential (primary) hypertension: Secondary | ICD-10-CM | POA: Diagnosis not present

## 2017-08-19 DIAGNOSIS — E890 Postprocedural hypothyroidism: Secondary | ICD-10-CM | POA: Diagnosis not present

## 2017-08-19 DIAGNOSIS — E119 Type 2 diabetes mellitus without complications: Secondary | ICD-10-CM | POA: Diagnosis not present

## 2017-08-19 DIAGNOSIS — D500 Iron deficiency anemia secondary to blood loss (chronic): Secondary | ICD-10-CM

## 2017-08-19 DIAGNOSIS — E785 Hyperlipidemia, unspecified: Secondary | ICD-10-CM

## 2017-08-19 DIAGNOSIS — Z85048 Personal history of other malignant neoplasm of rectum, rectosigmoid junction, and anus: Secondary | ICD-10-CM | POA: Diagnosis not present

## 2017-08-19 DIAGNOSIS — D72819 Decreased white blood cell count, unspecified: Secondary | ICD-10-CM | POA: Diagnosis not present

## 2017-08-19 DIAGNOSIS — D709 Neutropenia, unspecified: Secondary | ICD-10-CM | POA: Diagnosis not present

## 2017-08-19 NOTE — Assessment & Plan Note (Addendum)
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   # Today Hb 11.2/iron studies- N; continue PO iron; NO IV iron today. Discussed re: Bone marrow Biopsy if worse. Hold off BMBx for now.   # Mild-mod intermittent neutropenia- ? Ethnic benign vs others. Asymtomatic.   # Follow up in 6 months/labs prior-CBC iron studies/ possible venofer.

## 2017-08-19 NOTE — Progress Notes (Signed)
[place] NOTE  Patient Care Team: [name], DO as PCP - General (Family Medicine) Cammie Sickle, MD as Consulting Physician (Internal Medicine)  CHIEF COMPLAINTS/PURPOSE OF CONSULTATION:   # AUG 2016- IRON DEFICIENCY ANEMIA- Possible AVMs of colon [s/p Argon laser; Dr.Elliot;EGD-neg Nov 2016] s/p IV Ferriheme x2 [[id number]; ALLERGY]; Venofer IV   # MILD LEUCOPENIA/NEUTROPENIA [ANC 1.2-1.3]-chronic ethnic neutropenia/asymptomatic  # Rectal well diff carcinoid [1.2CM; s/p polypectomy 2011]; Allergy- [facility] B[facility] Philomena Course 2016]- neck/face swelling [improved with benadryl]  HISTORY OF PRESENTING ILLNESS:  Diana Harmon 71 y.o.  female is here for follow-up for her iron deficiency anemia; patient last iron was in December 2017.  Patient denies any significant fatigue.  Her appetite is good.  No weight loss.  No nausea no vomiting.  No blood in stools.  Patient continues to be on iron pills.  No fevers or chills.  No admission to the for infectious episodes.   ROS: A complete 10 point review of system is done which is negative except mentioned above in history of present illness  MEDICAL HISTORY:  Past Medical History:  Diagnosis Date  . Anemia   . Benign tumor   . Diabetes mellitus without complication ([place])   . Hyperlipidemia   . Hypertension   . Nausea   . Patient is [name]   . Thyroid disease     SURGICAL HISTORY: Past Surgical History:  Procedure Laterality Date  . COLONOSCOPY    . COLONOSCOPY WITH PROPOFOL N/A 03/13/2015   Procedure: COLONOSCOPY WITH PROPOFOL;  Surgeon: Manya Silvas, MD;  Location: [facility] ENDOSCOPY;  Service: Endoscopy;  Laterality: N/A;  . ESOPHAGOGASTRODUODENOSCOPY N/A 03/13/2015   Procedure: ESOPHAGOGASTRODUODENOSCOPY (EGD);  Surgeon: Manya Silvas, MD;  Location: [facility] ENDOSCOPY;  Service: Endoscopy;  Laterality: N/A;  . THYROIDECTOMY    . tumor removed      SOCIAL HISTORY: Social  History   Socioeconomic History  . Marital status: Divorced    Spouse name: Not on file  . Number of children: Not on file  . Years of education: 69  . Highest education level: 12th grade  Occupational History  . Not on file  Social Needs  . Financial resource strain: Not hard at all  . Food insecurity:    Worry: Sometimes true    Inability: Never true  . Transportation needs:    Medical: No    Non-medical: No  Tobacco Use  . Smoking status: Never Smoker  . Smokeless tobacco: Never Used  Substance and Sexual Activity  . Alcohol use: Yes    Alcohol/week: 0.6 oz    Types: 1 Glasses of wine per week    Comment: occ. wine  . Drug use: No  . Sexual activity: Not on file  Lifestyle  . Physical activity:    Days per week: 2 days    Minutes per session: 60 min  . Stress: Not at all  Relationships  . Social connections:    Talks on phone: More than three times a week    Gets together: Never    Attends religious service: More than 4 times per year    Active member of club or organization: No    Attends meetings of clubs or organizations: Never    Relationship status: Divorced  . Intimate partner violence:    Fear of current or ex partner: No    Emotionally abused: No    Physically abused: No    Forced sexual activity: No  Other Topics Concern  . Not on file  Social History Narrative  . Not on file    FAMILY HISTORY: Family History  Problem Relation Age of Onset  . Parkinson's disease Mother   . Heart disease Father   . Lupus Sister   . Aneurysm Son   . Lupus Son     ALLERGIES:  is allergic to ferumoxytol; other; and penicillins.  MEDICATIONS:  Current Outpatient Medications  Medication Sig Dispense Refill  . acetaminophen (TYLENOL) 500 MG tablet Take 1,000 mg by mouth every 6 (six) hours as needed for moderate pain.    Marland Kitchen aspirin EC 81 MG tablet Take 1 tablet (81 mg total) by mouth daily. 100 tablet 12  . baclofen (LIORESAL) 10 MG tablet Take 1-2 tablets  (10-20 mg total) by mouth 2 (two) times daily as needed for muscle spasms. 60 each 1  . Blood Glucose Monitoring Suppl (ACCU-CHEK NANO SMARTVIEW) w/Device KIT Use to check blood glucose up to once daily as instructed 1 kit 0  . cholecalciferol (VITAMIN D) 1000 units tablet Take 5,000 Units by mouth daily.    . Cromolyn Sodium (NASAL ALLERGY NA) Place 1 spray into the nose daily as needed (allergies).    . diclofenac sodium (VOLTAREN) 1 % GEL Apply 2 g topically 3 (three) times daily as needed. On affected knee joints 100 g 5  . ferrous sulfate 325 (65 FE) MG tablet Take 1 tablet (325 mg total) by mouth 2 (two) times daily with a meal. 180 tablet 3  . glucose blood (ACCU-CHEK SMARTVIEW) test strip Check blood glucose up to once daily as instructed 100 each 5  . hydrochlorothiazide (HYDRODIURIL) 12.5 MG tablet Take 1 tablet (12.5 mg total) by mouth daily. 90 tablet 3  . lactobacillus acidophilus (BACID) TABS tablet Take 2 tablets by mouth 2 (two) times daily.    Marland Kitchen levocetirizine (XYZAL) 5 MG tablet Take 1 tablet (5 mg total) by mouth every evening. 90 tablet 3  . loratadine (CLARITIN) 10 MG tablet Take 10 mg by mouth daily.    Marland Kitchen losartan (COZAAR) 50 MG tablet Take 1 tablet (50 mg total) by mouth daily. 90 tablet 3  . metFORMIN (GLUCOPHAGE) 500 MG tablet Take 2 tablets (1,000 mg total) by mouth 2 (two) times daily with a meal. 360 tablet 3  . simvastatin (ZOCOR) 20 MG tablet Take 1 tablet (20 mg total) by mouth daily at 6 PM. 90 tablet 3   No current facility-administered medications for this visit.       Marland Kitchen  PHYSICAL EXAMINATION: ECOG PERFORMANCE STATUS: 1 - Symptomatic but completely ambulatory  Vitals:   08/19/17 1006  BP: (!) 151/80  Pulse: 82  Resp: 16  Temp: (!) 96.1 F (35.6 C)   Filed Weights   08/19/17 1006  Weight: 217 lb 4.2 oz (98.5 kg)    GENERAL: Well-nourished well-developed; Alert, no distress and comfortable. Walks with a cane. Obese. She is accompanied by family.    EYES: no pallor or icterus OROPHARYNX: no thrush or ulceration; good dentition  NECK: supple, no masses felt LYMPH: no palpable lymphadenopathy in the cervical, axillary or inguinal regions LUNGS: clear to auscultation and No wheeze or crackles HEART/CVS: regular rate & rhythm and no murmurs; No lower extremity edema ABDOMEN: abdomen soft, non-tender and normal bowel sounds Musculoskeletal:no cyanosis of digits and no clubbing  PSYCH: alert & oriented x 3 with fluent speech NEURO: no focal motor/sensory deficits SKIN: no rashes or significant lesions LABORATORY DATA:  I have reviewed the data as listed Lab Results  Component Value Date   WBC 3.0 (L) 08/12/2017   HGB 11.1 (L) 08/12/2017   HCT 33.4 (L) 08/12/2017   MCV 92.1 08/12/2017   PLT 239 08/12/2017   Recent Labs    04/15/17 1004 07/21/17 0000 08/12/17 0951  NA 139 142 140  K 3.9 3.9 3.7  CL 103 106 103  CO2 '27 28 29  ' GLUCOSE 121* 150* 88  BUN 23* 18 18  CREATININE 0.96 0.91 0.84  CALCIUM 9.1 9.1 9.0  GFRNONAA 59* 63 >60  GFRAA >60 74 >60  PROT 8.3* 7.3  --   ALBUMIN 3.6  --   --   AST 32 33  --   ALT 20 19  --   ALKPHOS 54  --   --   BILITOT 0.5 0.4  --     ASSESSMENT & PLAN:  Iron deficiency anemia due to chronic blood loss # Iron deficiency anemia-most likely secondary to AVMs of the colon [colonoscopy November 2016];last received IV venofer dec 2017 [pre-meds].   # Today Hb 11.2/iron studies- N; continue PO iron; NO IV iron today. Discussed re: Bone marrow Biopsy if worse. Hold off BMBx for now.   # Mild-mod intermittent neutropenia- ? Ethnic benign vs others. Asymtomatic.   # Follow up in 6 months/labs prior-CBC iron studies/ possible venofer.       Cammie Sickle, MD 08/19/2017 10:33 AM

## 2017-08-27 ENCOUNTER — Other Ambulatory Visit: Payer: Self-pay | Admitting: Cardiovascular Disease

## 2017-08-27 DIAGNOSIS — I350 Nonrheumatic aortic (valve) stenosis: Secondary | ICD-10-CM

## 2017-08-28 ENCOUNTER — Other Ambulatory Visit: Payer: Self-pay

## 2017-08-28 ENCOUNTER — Ambulatory Visit (INDEPENDENT_AMBULATORY_CARE_PROVIDER_SITE_OTHER): Payer: Medicare Other

## 2017-08-28 DIAGNOSIS — I350 Nonrheumatic aortic (valve) stenosis: Secondary | ICD-10-CM

## 2017-08-28 MED ORDER — PERFLUTREN LIPID MICROSPHERE: 2.0000 mL | INTRAVENOUS | Status: AC | PRN

## 2017-08-28 MED ORDER — PERFLUTREN LIPID MICROSPHERE: 1.0000 mL | INTRAVENOUS | Status: DC | PRN

## 2017-09-02 ENCOUNTER — Other Ambulatory Visit: Payer: Self-pay | Admitting: *Deleted

## 2017-09-02 DIAGNOSIS — I350 Nonrheumatic aortic (valve) stenosis: Secondary | ICD-10-CM

## 2017-10-27 ENCOUNTER — Other Ambulatory Visit: Payer: Self-pay | Admitting: Family Medicine

## 2017-10-27 DIAGNOSIS — I10 Essential (primary) hypertension: Secondary | ICD-10-CM

## 2017-10-27 MED ORDER — LOSARTAN POTASSIUM 50 MG PO TABS: 50.0000 mg | ORAL_TABLET | Freq: Every day | ORAL | 3 refills | Status: DC

## 2017-11-04 NOTE — Progress Notes (Signed)
 Follow-up Outpatient Visit Date: 11/05/2017  Primary Care Provider: [name], Alexander J, DO [address] [place] [address]  Chief Complaint: Follow-up aortic stenosis and hypertension  HPI:  Diana Harmon is a 72 y.o. year-old female with history of mild aortic stenosis, hypertension, hyperlipidemia, diabetes mellitus, and thyroid disease, who presents for follow-up of aortic stenosis.  She was previously followed in our office by Dr. Ingal, having last been seen in 08/2016.  Today, Diana Harmon reports feeling well.  She denies chest pain and shortness of breath.  She has mild chronic dependent edema as well as occasional orthostatic lightheadedness when she stands up too quickly.  This is unchanged from her last visit.  She also has stable 3 pillow orthopnea that has been present for years.  She denies PND.  She also has not had any syncope.  She measures her blood pressures at home and reports readings are typically in the 130s over 80s.  She is tolerating her current medication regimen well.  Repeat echo in April showed slight progression of aortic stenosis, now in the moderate range.  --------------------------------------------------------------------------------------------------  Past Medical History:  Diagnosis Date  . Anemia   . Aortic stenosis   . Benign tumor   . Diabetes mellitus without complication ([facility])   . Hyperlipidemia   . Hypertension   . Nausea   . Patient is [name]   . Thyroid disease    Past Surgical History:  Procedure Laterality Date  . COLONOSCOPY    . COLONOSCOPY WITH PROPOFOL N/A 03/13/2015   Procedure: COLONOSCOPY WITH PROPOFOL;  Surgeon: Robert T Elliott, MD;  Location: [facility] ENDOSCOPY;  Service: Endoscopy;  Laterality: N/A;  . ESOPHAGOGASTRODUODENOSCOPY N/A 03/13/2015   Procedure: ESOPHAGOGASTRODUODENOSCOPY (EGD);  Surgeon: Robert T Elliott, MD;  Location: [facility] ENDOSCOPY;  Service: Endoscopy;  Laterality: N/A;  . THYROIDECTOMY    . tumor  removed      Current Meds  Medication Sig  . acetaminophen (TYLENOL) 500 MG tablet Take 1,000 mg by mouth every 6 (six) hours as needed for moderate pain.  . aspirin EC 81 MG tablet Take 1 tablet (81 mg total) by mouth daily.  . baclofen (LIORESAL) 10 MG tablet Take 1-2 tablets (10-20 mg total) by mouth 2 (two) times daily as needed for muscle spasms.  . Blood Glucose Monitoring Suppl (ACCU-CHEK NANO SMARTVIEW) w/Device KIT Use to check blood glucose up to once daily as instructed  . cholecalciferol (VITAMIN D) 1000 units tablet Take 5,000 Units by mouth daily.  . Cromolyn Sodium (NASAL ALLERGY NA) Place 1 spray into the nose daily as needed (allergies).  . diclofenac sodium (VOLTAREN) 1 % GEL Apply 2 g topically 3 (three) times daily as needed. On affected knee joints  . ferrous sulfate 325 (65 FE) MG tablet Take 1 tablet (325 mg total) by mouth 2 (two) times daily with a meal.  . glucose blood (ACCU-CHEK SMARTVIEW) test strip Check blood glucose up to once daily as instructed  . lactobacillus acidophilus (BACID) TABS tablet Take 2 tablets by mouth 2 (two) times daily.  . levocetirizine (XYZAL) 5 MG tablet Take 1 tablet (5 mg total) by mouth every evening.  . loratadine (CLARITIN) 10 MG tablet Take 10 mg by mouth daily.  . losartan (COZAAR) 50 MG tablet Take 1 tablet (50 mg total) by mouth daily.  . metFORMIN (GLUCOPHAGE) 500 MG tablet Take 2 tablets (1,000 mg total) by mouth 2 (two) times daily with a meal.  . simvastatin (ZOCOR) 20 MG   tablet Take 1 tablet (20 mg total) by mouth daily at 6 PM.  . [DISCONTINUED] hydrochlorothiazide (HYDRODIURIL) 12.5 MG tablet Take 1 tablet (12.5 mg total) by mouth daily.    Allergies: Ferumoxytol; Other; and Penicillins  Social History   Tobacco Use  . Smoking status: Never Smoker  . Smokeless tobacco: Never Used  Substance Use Topics  . Alcohol use: Yes    Alcohol/week: 0.6 oz    Types: 1 Glasses of wine per week    Comment: occ. wine  . Drug  use: No    Family History  Problem Relation Age of Onset  . Parkinson's disease Mother   . Heart disease Father   . Lupus Sister   . Aneurysm Son   . Lupus Son     Review of Systems: A 12-system review of systems was performed and was negative except as noted in the HPI.  --------------------------------------------------------------------------------------------------  Physical Exam: BP (!) 148/70 (BP Location: Left Arm, Patient Position: Sitting, Cuff Size: Large)   Pulse 76   Ht 5' (1.524 m)   Wt 219 lb (99.3 kg)   BMI 42.77 kg/m   General: NAD.  Accompanied by her daughter. HEENT: No conjunctival pallor or scleral icterus. Moist mucous membranes.  OP clear. Neck: Supple without lymphadenopathy, thyromegaly, JVD, or HJR. Lungs: Normal work of breathing. Clear to auscultation bilaterally without wheezes or crackles. Heart: Regular rate and rhythm with 3/6 crescendo-decrescendo systolic murmur loudest at the right upper sternal border.  No rubs or gallops.  Unable to assess PMI due to body habitus. Abd: Bowel sounds present. Soft, NT/ND.  Unable to assess HSM due to body habitus. Ext: Trace pretibial edema bilaterally.  2+ radial pulses bilaterally.  Skin: Warm and dry without rash.  EKG: Normal sinus rhythm with right bundle branch block.  No significant change since 07/10/2016.  Lab Results  Component Value Date   WBC 3.0 (L) 08/12/2017   HGB 11.1 (L) 08/12/2017   HCT 33.4 (L) 08/12/2017   MCV 92.1 08/12/2017   PLT 239 08/12/2017    Lab Results  Component Value Date   NA 140 08/12/2017   K 3.7 08/12/2017   CL 103 08/12/2017   CO2 29 08/12/2017   BUN 18 08/12/2017   CREATININE 0.84 08/12/2017   GLUCOSE 88 08/12/2017   ALT 19 07/21/2017    Lab Results  Component Value Date   CHOL 163 07/21/2017   HDL 68 07/21/2017   LDLCALC 77 07/21/2017   TRIG 95 07/21/2017   CHOLHDL 2.4 07/21/2017     --------------------------------------------------------------------------------------------------  ASSESSMENT AND PLAN: Aortic stenosis Patient has chronic orthopnea and leg edema that has been present for years.  I am not convinced that this is related to her aortic valve disease, though repeat echo this spring was notable for modest progression of aortic stenosis.  Her AS is now moderate in severity and will require continued clinical follow-up.  We discussed the natural history of aortic stenosis and role for surgical intervention if the AS becomes severe and symptoms develop.  Hypertension Blood pressure suboptimally controlled today (goal less than 130/80).  We have agreed to increase HCTZ to 25 mg daily with repeat BMP in 1 month.  I will defer further blood pressure management to Dr. Parks Ranger.  Morbid obesity BMI greater than 40.  Weight loss through diet and exercise encouraged.  Follow-up: Return to clinic in 6 months.  Nelva Bush, MD 11/05/2017 11:42 PM

## 2017-11-05 ENCOUNTER — Ambulatory Visit: Payer: Medicare Other | Admitting: Internal Medicine

## 2017-11-05 ENCOUNTER — Encounter: Payer: Self-pay | Admitting: Internal Medicine

## 2017-11-05 VITALS — BP 148/70 | HR 76 | Ht 60.0 in | Wt 219.0 lb

## 2017-11-05 DIAGNOSIS — I10 Essential (primary) hypertension: Secondary | ICD-10-CM

## 2017-11-05 DIAGNOSIS — I350 Nonrheumatic aortic (valve) stenosis: Secondary | ICD-10-CM

## 2017-11-05 MED ORDER — HYDROCHLOROTHIAZIDE 25 MG PO TABS: 25.0000 mg | ORAL_TABLET | Freq: Every day | ORAL | 3 refills | Status: DC

## 2017-11-05 NOTE — Patient Instructions (Signed)
Medication Instructions:  Your physician has recommended you make the following change in your medication:  1. INCREASE Hydrochlorothiazide to 25 mg once daily   Labwork: Your physician recommends that you return for lab work in: 1 month around August 3rd. Go to [facility] Entrance and check in at the front desk to have it done. We will call you with results.   Follow-Up: Your physician wants you to follow-up in: 6 months with Dr. Saunders Revel. You will receive a reminder letter in the mail two months in advance. If you don't receive a letter, please call our office to schedule the follow-up appointment.  It was a pleasure seeing you today here in the office. Please do not hesitate to give [place] a call back if you have any further questions. [place], BSN

## 2017-12-05 ENCOUNTER — Other Ambulatory Visit
Admission: RE | Admit: 2017-12-05 | Discharge: 2017-12-05 | Disposition: A | Discharge status: Home / Self Care | Payer: Medicare Other | Source: Ambulatory Visit | Attending: Internal Medicine | Admitting: Internal Medicine

## 2017-12-05 DIAGNOSIS — I350 Nonrheumatic aortic (valve) stenosis: Secondary | ICD-10-CM | POA: Diagnosis not present

## 2017-12-05 DIAGNOSIS — I10 Essential (primary) hypertension: Secondary | ICD-10-CM | POA: Insufficient documentation

## 2017-12-05 LAB — BASIC METABOLIC PANEL
Anion gap: 11 (ref 5–15)
BUN: 31 mg/dL — AB (ref 8–23)
CHLORIDE: 104 mmol/L (ref 98–111)
CO2: 26 mmol/L (ref 22–32)
CREATININE: 1.06 mg/dL — AB (ref 0.44–1.00)
Calcium: 9.4 mg/dL (ref 8.9–10.3)
GFR, EST AFRICAN AMERICAN: 60 mL/min — AB (ref >60)
GFR, EST NON AFRICAN AMERICAN: 52 mL/min — AB (ref >60)
Glucose, Bld: 122 mg/dL — ABNORMAL HIGH (ref 70–99)
POTASSIUM: 3.9 mmol/L (ref 3.5–5.1)
SODIUM: 141 mmol/L (ref 135–145)

## 2017-12-16 DIAGNOSIS — E113393 Type 2 diabetes mellitus with moderate nonproliferative diabetic retinopathy without macular edema, bilateral: Secondary | ICD-10-CM | POA: Diagnosis not present

## 2017-12-25 ENCOUNTER — Ambulatory Visit (INDEPENDENT_AMBULATORY_CARE_PROVIDER_SITE_OTHER): Payer: Medicare Other | Admitting: Family Medicine

## 2017-12-25 ENCOUNTER — Encounter: Payer: Self-pay | Admitting: Family Medicine

## 2017-12-25 ENCOUNTER — Ambulatory Visit
Admission: RE | Admit: 2017-12-25 | Discharge: 2017-12-25 | Disposition: A | Discharge status: Home / Self Care | Payer: Medicare Other | Source: Ambulatory Visit | Attending: Family Medicine | Admitting: Family Medicine

## 2017-12-25 ENCOUNTER — Other Ambulatory Visit: Payer: Self-pay | Admitting: Family Medicine

## 2017-12-25 VITALS — BP 130/61 | HR 83 | Temp 98.6°F | Resp 16 | Ht 60.0 in | Wt 221.8 lb

## 2017-12-25 DIAGNOSIS — M170 Bilateral primary osteoarthritis of knee: Secondary | ICD-10-CM | POA: Insufficient documentation

## 2017-12-25 DIAGNOSIS — W19XXXA Unspecified fall, initial encounter: Secondary | ICD-10-CM

## 2017-12-25 DIAGNOSIS — M25562 Pain in left knee: Secondary | ICD-10-CM | POA: Diagnosis not present

## 2017-12-25 DIAGNOSIS — G8929 Other chronic pain: Secondary | ICD-10-CM | POA: Diagnosis not present

## 2017-12-25 DIAGNOSIS — M25561 Pain in right knee: Principal | ICD-10-CM

## 2017-12-25 DIAGNOSIS — M1711 Unilateral primary osteoarthritis, right knee: Secondary | ICD-10-CM | POA: Diagnosis not present

## 2017-12-25 DIAGNOSIS — E119 Type 2 diabetes mellitus without complications: Secondary | ICD-10-CM

## 2017-12-25 DIAGNOSIS — M11269 Other chondrocalcinosis, unspecified knee: Secondary | ICD-10-CM | POA: Insufficient documentation

## 2017-12-25 MED ORDER — LIDOCAINE HCL (PF) 1 % IJ SOLN
4.0000 mL | Freq: Once | INTRAMUSCULAR | Status: AC
  Administered 2017-12-25: 4 mL

## 2017-12-25 MED ORDER — METHYLPREDNISOLONE ACETATE 40 MG/ML IJ SUSP
40.0000 mg | Freq: Once | INTRAMUSCULAR | Status: AC
  Administered 2017-12-25: 40 mg via INTRA_ARTICULAR

## 2017-12-25 NOTE — Progress Notes (Addendum)
Subjective:    Patient ID: Diana Harmon, female    DOB: [birth year], 72 y.o.   MRN: [id number]  Diana Harmon is a 72 y.o. female presenting on 12/25/2017 for Fall (as per patient fell first week in 07/19 Right knee pain which was getting improved at begining but now getting worst)  Accompanied by daughter, [name] today. Both provide history.  HPI   Right Knee pain / Fall accidental (November 01, 2017) / Chronic Bilateral Knee Pain (R>L) Osteoarthritis - Last seen for knee pain due to arthritis back in 10/2016, see note for background, on Tylenol, not taking NSAIDs due to history of contraindicate PUD. She was given Diclofenac topical with some relief and also using [facility].  Today here reports recent episode of accidental fall, she was rushing to bathroom, and her right foot got caught on rug and she twisted and fell forward and to her side, she twisted R knee, and she caught shower curtain and she did not hit head. She did not have acute pain in R knee and denied any bruising. Gradually worsening R knee pain and soreness, with difficulty walking on it due to pain and some weakness in knee, without mechanical locking symptoms, it is limited in range of motion, has more difficulty ambulating longer distances or prolong standing, using walker at home. Now having some R hip pain radiating down leg onset after knee injury as she is not walking regularly and thinks affecting her hip. Has known arthritis in other joints. - Currently taking Tylenol 1-2 x daily some relief - Using topicals as needed - Not wearing knee brace - No prior knee injection, or possibly had a numbing injection years ago - Not followed by Orthopedics currently Admits R hip pain Denies any fevers/chills, numbness, tingling, weakness,redness or bruising of knee, laceration, head injury, dizziness, edema or other joint pain  Morbid Obesity BMI >43 Reports some weight gain. She is limited by activity and more  recently unable to ambulate as she used to to stay active. Trying to improve diet.  Depression screen [facility] 2/9 12/25/2017 04/08/2017 05/16/2016  Decreased Interest 0 0 0  Down, Depressed, Hopeless 0 0 0  PHQ - 2 Score 0 0 0    Social History   Tobacco Use  . Smoking status: Never Smoker  . Smokeless tobacco: Never Used  Substance Use Topics  . Alcohol use: Yes    Alcohol/week: 1.0 standard drinks    Types: 1 Glasses of wine per week    Comment: occ. wine  . Drug use: No    Review of Systems Per HPI unless specifically indicated above     Objective:    BP 130/61   Pulse 83   Temp 98.6 F (37 C) (Oral)   Resp 16   Ht 5' (1.524 m)   Wt 221 lb 12.8 oz (100.6 kg)   BMI 43.32 kg/m   Wt Readings from Last 3 Encounters:  12/25/17 221 lb 12.8 oz (100.6 kg)  11/05/17 219 lb (99.3 kg)  08/19/17 217 lb 4.2 oz (98.5 kg)    Physical Exam  Constitutional: She is oriented to person, place, and time. She appears well-developed and well-nourished. No distress.  Well-appearing, comfortable, cooperative, obese  HENT:  Head: Normocephalic and atraumatic.  Mouth/Throat: Oropharynx is clear and moist.  Eyes: Conjunctivae are normal. Right eye exhibits no discharge. Left eye exhibits no discharge.  Cardiovascular: Normal rate.  Pulmonary/Chest: Effort normal.  Musculoskeletal: She exhibits no edema (No significant  LE edema).  Bilateral Knees Inspection: Mild bulky appearance and symmetrical. No ecchymosis. Mild effusion increased R>L Palpation: R Knee tender Lateral joint line. Mild crepitus ROM: Mostly full active ROM bilaterally - somewhat reduced R knee full ext and full flexion due to some swelling and pain Strength: 5/5 intact knee flex/ext, ankle dorsi/plantarflex Neurovascular: distally intact sensation light touch and pulses  Non tender over R hip greater trochanter.  Using walker to ambulate  Neurological: She is alert and oriented to person, place, and time.  Skin: Skin is  warm and dry. No rash noted. She is not diaphoretic. No erythema.  Psychiatric: She has a normal mood and affect. Her behavior is normal.  Well groomed, good eye contact, normal speech and thoughts  Nursing note and vitals reviewed.    ________________________________________________________ PROCEDURE NOTE Date: 12/25/17 Right Knee Steroid injection Discussed benefits and risks (including pain, bleeding, infection, steroid flare). Verbal consent given by patient. Medication:  1 cc Depo-medrol 40mg  and 4 cc Lidocaine 1% without epi Time Out taken  Landmarks identified. Area cleansed with alcohol wipes.Using 21 gauge and 1, 1/2 inch needle, Right knee joint space was injected (with above listed medication) via medial approach cold spray used for superficial anesthetic.Sterile bandage placed.Patient tolerated procedure well without bleeding or paresthesias.No complications.   I have personally reviewed the radiology report from 12/25/17 on R Knee X-ray.  Narrative    CLINICAL DATA: [place] 2 months ago, persistent pain  EXAM: RIGHT KNEE - COMPLETE 4+ VIEW  COMPARISON: Right knee films of 10/24/2010  FINDINGS: There is now tricompartmental degenerative joint disease of the right knee primarily involving the medial compartment where there is more loss of joint space and sclerosis with spurring present. No fracture or joint effusion is seen. There is chondrocalcinosis present which may indicate CPPD arthropathy or can be seen with osteoarthritic change. Also, there appear to be small loose bodies in the joint space.  IMPRESSION: 1. Progression of tricompartmental degenerative joint disease. 2. No acute abnormality. 3. Chondrocalcinosis. Possible CPPD arthropathy. 4. Question small loose bodies in joint space.   Electronically Signed By: Ivar Drape M.D. On: 12/25/2017 14:17      Results for orders placed or performed during the hospital encounter of 42/59/56  Basic  metabolic panel  Result Value Ref Range   Sodium 141 135 - 145 mmol/L   Potassium 3.9 3.5 - 5.1 mmol/L   Chloride 104 98 - 111 mmol/L   CO2 26 22 - 32 mmol/L   Glucose, Bld 122 (H) 70 - 99 mg/dL   BUN 31 (H) 8 - 23 mg/dL   Creatinine, Ser 1.06 (H) 0.44 - 1.00 mg/dL   Calcium 9.4 8.9 - 10.3 mg/dL   GFR calc non Af Amer 52 (L) >60 mL/min   GFR calc Af Amer 60 (L) >60 mL/min   Anion gap 11 5 - 15      Assessment & Plan:   Problem List Items Addressed This Visit    Chronic pain of both knees - Primary    Now acute on chronic R>L knee pain after recent fall x 2 months ago, persistent pain and swelling of R knee Cannot rule out meniscus injury. Likely R hip pain related to dysfunctional ambulation of R knee - Known knee OA/DJD - Able to bear weight but some difficulty with prolonged stand/activity, question some knee instability - No prior history of knee surgery, arthroscopy - Contraindicated NSAIDs due to PUD (bleeding ulcer in past, failed Naproxen, Meloxicam) - Last  imaging bilateral knee X-rays 2012 with mild to moderate DJD, medial>lateral DJD  Plan: Right knee joint steroid injection performed today, see procedure note for details.  Check X-ray today after fall/injury R knee only - will f/u results, rule out fracture Continue maximize Tylenol 500-1000mg  per dose TID PRN breakthrough Continue Baclofen PRN RICE therapy (rest, ice, compression, elevation) for swelling, activity modification - May use knee brace for more support with activity Follow-up as scheduled 4 weeks monitor progress, if improving after injection, more likely sprain and will gradually improve and consider referral PT, if not improving can return sooner or contact us and likely will need Referral to Ortho - consider MRI and adv imaging for possible meniscus injury      Relevant Medications   lidocaine (PF) (XYLOCAINE) 1 % injection 4 mL (Completed)   methylPREDNISolone acetate (DEPO-MEDROL) injection 40 mg  (Completed)   Other Relevant Orders   DG Knee Complete 4 Views Right   Morbid obesity (HCC)    BMI >43, with some abnormal weight gain Limited by OA/DJD hip knee back pain affecting mobility Goal to improve diet Encourage wt loss      Osteoarthritis of both knees    Acute flare after fall Also likely underlying etiology for chronic knee pain, also with morbid obesity causing inc stress on knees - See A&P under chronic knee pain      Relevant Medications   lidocaine (PF) (XYLOCAINE) 1 % injection 4 mL (Completed)   methylPREDNISolone acetate (DEPO-MEDROL) injection 40 mg (Completed)   Other Relevant Orders   DG Knee Complete 4 Views Right    Other Visit Diagnoses    Fall, initial encounter          Meds ordered this encounter  Medications  . lidocaine (PF) (XYLOCAINE) 1 % injection 4 mL  . methylPREDNISolone acetate (DEPO-MEDROL) injection 40 mg    Follow up plan: Return if symptoms worsen or fail to improve, for keep apt in 9/24.  Update** after visit 12/25/17 ** Reviewed R knee x-ray, documented result note and staff to contact patient with results tomorrow 8/23, no acute fracture, advanced tricompartmental arthritis now, some loose body and calcification.  - No change to treatment plan, follow-up after injection as planned  [name], [facility] 12/25/2017, 12:38 PM

## 2017-12-25 NOTE — Patient Instructions (Addendum)
Thank you for coming to the office today.  You received a Right Knee Joint steroid injection today. - Lidocaine numbing medicine may ease the pain initially for a few hours until it wears off - As discussed, you may experience a "steroid flare" this evening or within 24-48 hours, anytime medicine is injected into an inflamed joint it can cause the pain to get worse temporarily - Everyone responds differently to these injections, it depends on the patient and the severity of the joint problem, it may provide anywhere from days to weeks, to months of relief. Ideal response is >6 months relief - Try to take it easy for next 1-2 days, avoid over activity and strain on joint (limit walking for knee) - Recommend the following:   - For swelling - rest, compression sleeve / ACE wrap, elevation, and ice packs as needed for first few days   - For pain in future may use heating pad or moist heat as needed   - In future Knee Brace or Sleeve for support can help reduce pain and allow you to have more support for Knee to avoid fall in future, wear with activities, do not sleep in  Medication Increase tylenol dose Recommend to start taking Tylenol Extra Strength 500mg  tabs - take 1 to 2 tabs per dose (max 1000mg ) every 6-8 hours for pain (take regularly, don't skip a dose for next 7 days), max 24 hour daily dose is 6 tablets or 3000mg . In the future you can repeat the same everyday Tylenol course for 1-2 weeks at a time.   Continue Diclofenac and Icy Hot as needed - may use on hip as well   - Given the nature of your injury duration of your pain/injury, I am concerned for possible meniscus cartilage ligament injury  If not improving as expected over next several weeks, please follow-up sooner for re-evaluation.   May need Physical Therapy referral if doing much better but just need to improve hip and knee back to where you were before   Please schedule a Follow-up Appointment to: Return if symptoms worsen  or fail to improve, for keep apt in 9/24.  If you have any other questions or concerns, please feel free to call the office or send a message through [place]. You may also schedule an earlier appointment if necessary.  Additionally, you may be receiving a survey about your experience at our office within a few days to 1 week by e-mail or mail. We value your feedback.  [name], DO [place]

## 2017-12-25 NOTE — Assessment & Plan Note (Signed)
BMI >43, with some abnormal weight gain Limited by OA/DJD hip knee back pain affecting mobility Goal to improve diet Encourage wt loss

## 2017-12-25 NOTE — Assessment & Plan Note (Signed)
Now acute on chronic R>L knee pain after recent fall x 2 months ago, persistent pain and swelling of R knee Cannot rule out meniscus injury. Likely R hip pain related to dysfunctional ambulation of R knee - Known knee OA/DJD - Able to bear weight but some difficulty with prolonged stand/activity, question some knee instability - No prior history of knee surgery, arthroscopy - Contraindicated NSAIDs due to PUD (bleeding ulcer in past, failed Naproxen, Meloxicam) - Last imaging bilateral knee X-rays 2012 with mild to moderate DJD, medial>lateral DJD  Plan: Right knee joint steroid injection performed today, see procedure note for details.  Check X-ray today after fall/injury R knee only - will f/u results, rule out fracture Continue maximize Tylenol 500-1000mg  per dose TID PRN breakthrough Continue Baclofen PRN RICE therapy (rest, ice, compression, elevation) for swelling, activity modification - May use knee brace for more support with activity Follow-up as scheduled 4 weeks monitor progress, if improving after injection, more likely sprain and will gradually improve and consider referral PT, if not improving can return sooner or contact us and likely will need Referral to Ortho - consider MRI and adv imaging for possible meniscus injury

## 2017-12-25 NOTE — Assessment & Plan Note (Signed)
Acute flare after fall Also likely underlying etiology for chronic knee pain, also with morbid obesity causing inc stress on knees - See A&P under chronic knee pain

## 2018-01-27 ENCOUNTER — Encounter: Payer: Self-pay | Admitting: Family Medicine

## 2018-01-27 ENCOUNTER — Other Ambulatory Visit: Payer: Self-pay | Admitting: Family Medicine

## 2018-01-27 ENCOUNTER — Ambulatory Visit (INDEPENDENT_AMBULATORY_CARE_PROVIDER_SITE_OTHER): Payer: Medicare Other | Admitting: Family Medicine

## 2018-01-27 VITALS — BP 138/65 | HR 88 | Temp 98.8°F | Resp 16 | Ht 60.0 in | Wt 224.0 lb

## 2018-01-27 DIAGNOSIS — G8929 Other chronic pain: Secondary | ICD-10-CM | POA: Diagnosis not present

## 2018-01-27 DIAGNOSIS — Z6841 Body Mass Index (BMI) 40.0 and over, adult: Secondary | ICD-10-CM

## 2018-01-27 DIAGNOSIS — E785 Hyperlipidemia, unspecified: Secondary | ICD-10-CM

## 2018-01-27 DIAGNOSIS — M25562 Pain in left knee: Secondary | ICD-10-CM

## 2018-01-27 DIAGNOSIS — E559 Vitamin D deficiency, unspecified: Secondary | ICD-10-CM

## 2018-01-27 DIAGNOSIS — M25561 Pain in right knee: Secondary | ICD-10-CM

## 2018-01-27 DIAGNOSIS — M170 Bilateral primary osteoarthritis of knee: Secondary | ICD-10-CM

## 2018-01-27 DIAGNOSIS — E1169 Type 2 diabetes mellitus with other specified complication: Secondary | ICD-10-CM | POA: Diagnosis not present

## 2018-01-27 DIAGNOSIS — Z0000 Encounter for general adult medical examination without abnormal findings: Secondary | ICD-10-CM

## 2018-01-27 DIAGNOSIS — D500 Iron deficiency anemia secondary to blood loss (chronic): Secondary | ICD-10-CM

## 2018-01-27 DIAGNOSIS — E119 Type 2 diabetes mellitus without complications: Secondary | ICD-10-CM | POA: Diagnosis not present

## 2018-01-27 LAB — POCT GLYCOSYLATED HEMOGLOBIN (HGB A1C): Hemoglobin A1C: 6.7 % — AB (ref 4.0–5.6)

## 2018-01-27 NOTE — Assessment & Plan Note (Signed)
Controlled cholesterol on statin Last lipid panel 2019  Plan: 1. Continue current meds - Simvastatin 20mg  daily 2. Encourage improved lifestyle - low carb/cholesterol, reduce portion size, continue improving activity as tolerated - now refer for Knee due to pain and limitation

## 2018-01-27 NOTE — Assessment & Plan Note (Signed)
Mild elevated A1c up to 6.7, from prior 6.2 to 6.3. Still at treatment goal < 8 No hyperglycemia or hypoglycemia Complications - other including hyperlipidemia, GERD, obesity - increases risk of future cardiovascular complications   Plan:  1. Continue Metformin 1000mg  AM / 500mg  PM. In future if sugar improved and wt loss, or if start new agent GLP1 as discussed - then gradually may try reduce to 500mg  BID only or possibly DC - Reconsider GLP1 injection options in future if elevated sugar or concern with wt not improving - previously given handout before 2. Encourage improved lifestyle - low carb, low sugar diet, reduce portion size, continue improving regular exercise 3. Check CBG, bring log to next visit for review 4. Continue ASA, ARB, Statin 5. Follow-up 6 month DM A1c yearly labs

## 2018-01-27 NOTE — Progress Notes (Signed)
Subjective:    Patient ID: Diana Harmon, female    DOB: 1946-01-16, 72 y.o.   MRN: [id number]  Diana Harmon is a 72 y.o. female presenting on 01/27/2018 for Diabetes   HPI   CHRONIC DM, Type 2 with Hyperlipidemia Reports no concerns. Had [facility] nurse check reading with A1c 5.6 recently CBGs: Avg 100-120.Low none. High< 150.Checks CBGs few times weekly Meds: Metformin 500mg  x 2 tabs in AM and 1 tab in PM Reports good compliance. Tolerating well w/o side-effects Currently on ARB, ASA 81, Simvastatin 20mg . Not due yet for repeat lipids, next due 07/2018 Lifestyle: -Diet (Still followscarb diet, eliminated bread in diet, less sugar in coffee) -Exercise (some water aerobics) - Followed by Dr Lorie Apley (06/18/17) Ophtho had eye exam already Denies hypoglycemia  FOLLOW-UP Right Knee pain / Fall accidental (November 01, 2017) / Chronic Bilateral Knee Pain (R>L) Osteoarthritis - Last visit with me 12/25/17, for initial visit for same problem, treated with X-ray showed advanced progression of Tricompartmental arthritis DJD and some possible loose body formation, she was given initial R knee steroid injection, Tylenol, Baclofen, Knee Brace and walker. - Today now interval update, reports that she only felt marginally better not quite 50% for few days only < 1 week after steroid injection in knee joint, did not dramatically improve on knee injection as expected. Still describes R knee pain, difficult with knee instability at times with feeling she may fall and still having significant pain, difficulty ambulating. - Requesting to get 2nd opinion now. - Denies new fall or trauma or injury, no other joint pain or swelling  Health Maintenance: Will return for Flu Vaccine, high dose, once in stock.  Depression screen [facility] 2/9 01/27/2018 12/25/2017 04/08/2017  Decreased Interest 0 0 0  Down, Depressed, Hopeless 0 0 0  PHQ - 2 Score 0 0 0    Social History   Tobacco Use  .  Smoking status: Never Smoker  . Smokeless tobacco: Never Used  Substance Use Topics  . Alcohol use: Yes    Alcohol/week: 1.0 standard drinks    Types: 1 Glasses of wine per week    Comment: occ. wine  . Drug use: No    Review of Systems Per HPI unless specifically indicated above     Objective:    BP 138/65   Pulse 88   Temp 98.8 F (37.1 C) (Oral)   Resp 16   Ht 5' (1.524 m)   Wt 224 lb (101.6 kg)   BMI 43.75 kg/m   Wt Readings from Last 3 Encounters:  01/27/18 224 lb (101.6 kg)  12/25/17 221 lb 12.8 oz (100.6 kg)  11/05/17 219 lb (99.3 kg)    Physical Exam  Constitutional: She is oriented to person, place, and time. She appears well-developed and well-nourished. No distress.  Well-appearing, comfortable, cooperative, obese  HENT:  Head: Normocephalic and atraumatic.  Mouth/Throat: Oropharynx is clear and moist.  Eyes: Conjunctivae are normal. Right eye exhibits no discharge. Left eye exhibits no discharge.  Cardiovascular: Normal rate.  Pulmonary/Chest: Effort normal.  Musculoskeletal: She exhibits no edema (No significant LE edema).  Bilateral Knees Inspection: Mild bulky appearance and symmetrical. No ecchymosis. Mild effusion persistent R>L Palpation: Persistent R Knee tender Lateral joint line. Mild crepitus ROM: Mostly full active ROM bilaterally - somewhat reduced R knee full ext and full flexion due to some swelling and pain Strength: 5/5 intact knee flex/ext, ankle dorsi/plantarflex Neurovascular: distally intact sensation light touch and pulses  Non  tender over R hip greater trochanter.  Using walker to ambulate  Neurological: She is alert and oriented to person, place, and time.  Skin: Skin is warm and dry. No rash noted. She is not diaphoretic. No erythema.  Psychiatric: She has a normal mood and affect. Her behavior is normal.  Well groomed, good eye contact, normal speech and thoughts  Nursing note and vitals reviewed.    Diabetic Foot Exam -  Simple   Simple Foot Form Diabetic Foot exam was performed with the following findings:  Yes 01/27/2018  9:00 AM  Visual Inspection No deformities, no ulcerations, no other skin breakdown bilaterally:  Yes Sensation Testing Intact to touch and monofilament testing bilaterally:  Yes Pulse Check Posterior Tibialis and Dorsalis pulse intact bilaterally:  Yes Comments    Recent Labs    04/24/17 1125 07/21/17 0000 01/27/18 0901  HGBA1C 6.2* 6.3* 6.7*    Results for orders placed or performed in visit on 01/27/18  POCT HgB A1C  Result Value Ref Range   Hemoglobin A1C 6.7 (A) 4.0 - 5.6 %      Assessment & Plan:   Problem List Items Addressed This Visit    Hyperlipidemia associated with type 2 diabetes mellitus ([place])    Controlled cholesterol on statin Last lipid panel 2019  Plan: 1. Continue current meds - Simvastatin 20mg  daily 2. Encourage improved lifestyle - low carb/cholesterol, reduce portion size, continue improving activity as tolerated - now refer for Knee due to pain and limitation      Osteoarthritis of both knees   Relevant Orders   Ambulatory referral to Orthopedic Surgery   Type 2 diabetes mellitus with other specified complication ([place]) - Primary    Mild elevated A1c up to 6.7, from prior 6.2 to 6.3. Still at treatment goal < 8 No hyperglycemia or hypoglycemia Complications - other including hyperlipidemia, GERD, obesity - increases risk of future cardiovascular complications   Plan:  1. Continue Metformin 1000mg  AM / 500mg  PM. In future if sugar improved and wt loss, or if start new agent GLP1 as discussed - then gradually may try reduce to 500mg  BID only or possibly DC - Reconsider GLP1 injection options in future if elevated sugar or concern with wt not improving - previously given handout before 2. Encourage improved lifestyle - low carb, low sugar diet, reduce portion size, continue improving regular exercise 3. Check CBG, bring log to next visit for  review 4. Continue ASA, ARB, Statin 5. Follow-up 6 month DM A1c yearly labs       Other Visit Diagnoses    Chronic pain of right knee       Relevant Orders   Ambulatory referral to Orthopedic Surgery     Persistent acute on chronic R>L knee pain after recent fall >2-3  months ago Not improving as anticipated on treatment, s/p injection 8/22 among other therapy Cannot rule out meniscus injury. Likely R hip pain related to dysfunctional ambulation of R knee - Known knee OA/DJD - updated imaging with R knee x-ray progression of tricompartmental OA/DJD confirmed - Able to bear weight but some difficulty with prolonged stand/activity still worsening, question some knee instability - No prior history of knee surgery, arthroscopy - Contraindicated NSAIDs due to PUD (bleeding ulcer in past, failed Naproxen, Meloxicam)  Plan: Referral to [facility] for 2nd opinion evaluation of Right knee pain, prior fall in June 2019 about 3 months ago triggering worsening symptoms, confirmed on X-ray 12/2017 showed progressed tricompartmental DJD and loose  body, concern for possible meniscus injury now with knee instability, also worsening R hip pain. Already received and failed a steroid injection R knee. Request 2nd opinion, may need advanced imaging and further management  Orders Placed This Encounter  Procedures  . Ambulatory referral to Orthopedic Surgery    Referral Priority:   Routine    Referral Type:   Surgical    Referral Reason:   Specialty Services Required    Referred to Provider:   Dereck Leep, MD    Requested Specialty:   Orthopedic Surgery    Number of Visits Requested:   1  . POCT HgB A1C     No orders of the defined types were placed in this encounter.   Follow up plan: Return in about 6 months (around 07/28/2018) for Annual Physical.  Future labs ordered for 07/2018  [name], [facility] 01/27/2018, 1:22  PM

## 2018-01-27 NOTE — Patient Instructions (Addendum)
Thank you for coming to the office today.  Please schedule and return for a NURSE ONLY VISIT for VACCINE - Approximately around 1 week October 2019 - Need High Dose Flu Vaccine  A1c 6.7 today, mildly elevated - no change to medicine, try to improve diet as we are treating knee to get you more active  Recent Labs    04/24/17 1125 07/21/17 0000 01/27/18 0901  HGBA1C 6.2* 6.3* 6.7*    Stay tuned for Orthopedic apt - after referral today  [facility] [place], [place]  [address] Phone: [phone]  DUE for FASTING BLOOD WORK (no food or drink after midnight before the lab appointment, only water or coffee without cream/sugar on the morning of)  SCHEDULE "Lab Only" visit in the morning at the clinic for lab draw in 6 MONTHS   - Make sure Lab Only appointment is at about 1 week before your next appointment, so that results will be available  For Lab Results, once available within 2-3 days of blood draw, you can can log in to MyChart online to view your results and a brief explanation. Also, we can discuss results at next follow-up visit.   Please schedule a Follow-up Appointment to: Return in about 6 months (around 07/28/2018) for Annual Physical.  If you have any other questions or concerns, please feel free to call the office or send a message through [place]. You may also schedule an earlier appointment if necessary.  Additionally, you may be receiving a survey about your experience at our office within a few days to 1 week by e-mail or mail. We value your feedback.  [name], DO [place]

## 2018-02-06 ENCOUNTER — Other Ambulatory Visit: Payer: Self-pay | Admitting: *Deleted

## 2018-02-06 ENCOUNTER — Ambulatory Visit: Payer: Medicare Other

## 2018-02-06 DIAGNOSIS — M1711 Unilateral primary osteoarthritis, right knee: Secondary | ICD-10-CM | POA: Diagnosis not present

## 2018-02-06 DIAGNOSIS — Z23 Encounter for immunization: Secondary | ICD-10-CM

## 2018-02-06 DIAGNOSIS — D500 Iron deficiency anemia secondary to blood loss (chronic): Secondary | ICD-10-CM

## 2018-02-09 ENCOUNTER — Other Ambulatory Visit: Payer: Medicare Other

## 2018-02-09 ENCOUNTER — Inpatient Hospital Stay: Payer: Medicare Other | Attending: Internal Medicine

## 2018-02-09 DIAGNOSIS — Z7984 Long term (current) use of oral hypoglycemic drugs: Secondary | ICD-10-CM | POA: Diagnosis not present

## 2018-02-09 DIAGNOSIS — I350 Nonrheumatic aortic (valve) stenosis: Secondary | ICD-10-CM | POA: Diagnosis not present

## 2018-02-09 DIAGNOSIS — Z85030 Personal history of malignant carcinoid tumor of large intestine: Secondary | ICD-10-CM | POA: Diagnosis not present

## 2018-02-09 DIAGNOSIS — D500 Iron deficiency anemia secondary to blood loss (chronic): Secondary | ICD-10-CM | POA: Diagnosis not present

## 2018-02-09 DIAGNOSIS — Z79899 Other long term (current) drug therapy: Secondary | ICD-10-CM | POA: Diagnosis not present

## 2018-02-09 DIAGNOSIS — D709 Neutropenia, unspecified: Secondary | ICD-10-CM | POA: Insufficient documentation

## 2018-02-09 DIAGNOSIS — Z7982 Long term (current) use of aspirin: Secondary | ICD-10-CM | POA: Insufficient documentation

## 2018-02-09 DIAGNOSIS — E079 Disorder of thyroid, unspecified: Secondary | ICD-10-CM | POA: Diagnosis not present

## 2018-02-09 LAB — COMPREHENSIVE METABOLIC PANEL
ALT: 24 U/L (ref 0–44)
AST: 35 U/L (ref 15–41)
Albumin: 3.6 g/dL (ref 3.5–5.0)
Alkaline Phosphatase: 48 U/L (ref 38–126)
Anion gap: 13 (ref 5–15)
BUN: 39 mg/dL — AB (ref 8–23)
CHLORIDE: 106 mmol/L (ref 98–111)
CO2: 23 mmol/L (ref 22–32)
CREATININE: 1.12 mg/dL — AB (ref 0.44–1.00)
Calcium: 9.1 mg/dL (ref 8.9–10.3)
GFR calc Af Amer: 56 mL/min — ABNORMAL LOW (ref >60)
GFR calc non Af Amer: 48 mL/min — ABNORMAL LOW (ref >60)
Glucose, Bld: 125 mg/dL — ABNORMAL HIGH (ref 70–99)
POTASSIUM: 4.1 mmol/L (ref 3.5–5.1)
SODIUM: 142 mmol/L (ref 135–145)
Total Bilirubin: 0.7 mg/dL (ref 0.3–1.2)
Total Protein: 8.1 g/dL (ref 6.5–8.1)

## 2018-02-09 LAB — IRON AND TIBC
IRON: 61 ug/dL (ref 28–170)
Saturation Ratios: 19 % (ref 10.4–31.8)
TIBC: 330 ug/dL (ref 250–450)
UIBC: 269 ug/dL

## 2018-02-09 LAB — CBC WITH DIFFERENTIAL/PLATELET
BASOS PCT: 1 %
Basophils Absolute: 0 10*3/uL (ref 0–0.1)
EOS PCT: 5 %
Eosinophils Absolute: 0.2 10*3/uL (ref 0–0.7)
HCT: 34.2 % — ABNORMAL LOW (ref 35.0–47.0)
Hemoglobin: 11.2 g/dL — ABNORMAL LOW (ref 12.0–16.0)
LYMPHS PCT: 47 %
Lymphs Abs: 1.8 10*3/uL (ref 1.0–3.6)
MCH: 30.9 pg (ref 26.0–34.0)
MCHC: 32.8 g/dL (ref 32.0–36.0)
MCV: 94 fL (ref 80.0–100.0)
MONO ABS: 0.5 10*3/uL (ref 0.2–0.9)
MONOS PCT: 13 %
Neutro Abs: 1.3 10*3/uL — ABNORMAL LOW (ref 1.4–6.5)
Neutrophils Relative %: 34 %
PLATELETS: 238 10*3/uL (ref 150–440)
RBC: 3.63 MIL/uL — ABNORMAL LOW (ref 3.80–5.20)
RDW: 14.2 % (ref 11.5–14.5)
WBC: 3.9 10*3/uL (ref 3.6–11.0)

## 2018-02-09 LAB — FERRITIN: FERRITIN: 202 ng/mL (ref 11–307)

## 2018-02-10 ENCOUNTER — Other Ambulatory Visit: Payer: Medicare Other

## 2018-02-17 ENCOUNTER — Inpatient Hospital Stay (HOSPITAL_BASED_OUTPATIENT_CLINIC_OR_DEPARTMENT_OTHER): Payer: Medicare Other | Admitting: Internal Medicine

## 2018-02-17 ENCOUNTER — Inpatient Hospital Stay: Payer: Medicare Other

## 2018-02-17 ENCOUNTER — Encounter: Payer: Self-pay | Admitting: Internal Medicine

## 2018-02-17 ENCOUNTER — Other Ambulatory Visit: Payer: Self-pay

## 2018-02-17 VITALS — BP 149/70 | HR 89 | Temp 97.0°F | Resp 18 | Ht 60.0 in | Wt 224.9 lb

## 2018-02-17 DIAGNOSIS — Z85030 Personal history of malignant carcinoid tumor of large intestine: Secondary | ICD-10-CM

## 2018-02-17 DIAGNOSIS — Z79899 Other long term (current) drug therapy: Secondary | ICD-10-CM | POA: Diagnosis not present

## 2018-02-17 DIAGNOSIS — I350 Nonrheumatic aortic (valve) stenosis: Secondary | ICD-10-CM | POA: Diagnosis not present

## 2018-02-17 DIAGNOSIS — Z7982 Long term (current) use of aspirin: Secondary | ICD-10-CM | POA: Diagnosis not present

## 2018-02-17 DIAGNOSIS — D709 Neutropenia, unspecified: Secondary | ICD-10-CM

## 2018-02-17 DIAGNOSIS — Z7984 Long term (current) use of oral hypoglycemic drugs: Secondary | ICD-10-CM

## 2018-02-17 DIAGNOSIS — D500 Iron deficiency anemia secondary to blood loss (chronic): Secondary | ICD-10-CM

## 2018-02-17 DIAGNOSIS — E079 Disorder of thyroid, unspecified: Secondary | ICD-10-CM

## 2018-02-17 NOTE — Assessment & Plan Note (Addendum)
#  Iron deficiency anemia-most likely secondary to AVMs of the colon [colonoscopy November 2016]; hemoglobin around 11-other causes include mild CKD/question thalassemia minor trait.  # Today Hb 11.2/iron studies- N; continue PO iron; NO IV iron today.  Again discussed regarding bone marrow biopsy; however hold bone marrow biopsy unless hemoglobin continues to drop.  # Mild-mod intermittent neutropenia- ? Ethnic benign vs others. Asymtomatic.  Today white count is 3.9.  Stable.  # DISPOSITION:  # Follow up in 6 monthsMD/labs prior-CBC iron studies/ possible venofer.  

## 2018-02-17 NOTE — Progress Notes (Signed)
[place] [facility] NOTE  Patient Care Team: [name], DO as PCP - General (Family Medicine) Cammie Sickle, MD as Consulting Physician (Internal Medicine)  CHIEF COMPLAINTS/PURPOSE OF CONSULTATION:   # AUG 2016- IRON DEFICIENCY ANEMIA- Possible AVMs of colon [s/p Argon laser; Dr.Elliot;EGD-neg Nov 2016] s/p IV Ferriheme x2 [[id number]; ALLERGY]; Venofer IV [pre-meds]  # MILD LEUCOPENIA/NEUTROPENIA [ANC 1.2-1.3]-chronic ethnic neutropenia/asymptomatic  # Rectal well diff carcinoid [1.2CM; s/p polypectomy 2011]; Allergy- [facility]ard Philomena Course 2016]- neck/face swelling [improved with benadryl]  HISTORY OF PRESENTING ILLNESS:  Diana Harmon 72 y.o.  female is here for follow-up for her iron deficiency anemia; patient last iron was in December 2017.  Patient continues to be on p.o. iron.  No blood in stools no black loose stools.  Denies constipation.  No abdominal discomfort.  No fatigue.  No difficulty swallowing.  No fevers or chills.  No admission to the for infectious episodes.  Review of Systems  Constitutional: Negative for chills, diaphoresis, fever, malaise/fatigue and weight loss.  HENT: Negative for nosebleeds and sore throat.   Eyes: Negative for double vision.  Respiratory: Negative for cough, hemoptysis, sputum production, shortness of breath and wheezing.   Cardiovascular: Negative for chest pain, palpitations, orthopnea and leg swelling.  Gastrointestinal: Negative for abdominal pain, blood in stool, constipation, diarrhea, heartburn, melena, nausea and vomiting.  Genitourinary: Negative for dysuria, frequency and urgency.  Musculoskeletal: Negative for back pain and joint pain.  Skin: Negative.  Negative for itching and rash.  Neurological: Negative for dizziness, tingling, focal weakness, weakness and headaches.  Endo/Heme/Allergies: Does not bruise/bleed easily.  Psychiatric/Behavioral: Negative for depression. The patient is not  nervous/anxious and does not have insomnia.      MEDICAL HISTORY:  Past Medical History:  Diagnosis Date  . Anemia   . Aortic stenosis   . Benign tumor   . Nausea   . Patient is [name]   . Thyroid disease     SURGICAL HISTORY: Past Surgical History:  Procedure Laterality Date  . COLONOSCOPY    . COLONOSCOPY WITH PROPOFOL N/A 03/13/2015   Procedure: COLONOSCOPY WITH PROPOFOL;  Surgeon: Manya Silvas, MD;  Location: [facility] ENDOSCOPY;  Service: Endoscopy;  Laterality: N/A;  . ESOPHAGOGASTRODUODENOSCOPY N/A 03/13/2015   Procedure: ESOPHAGOGASTRODUODENOSCOPY (EGD);  Surgeon: Manya Silvas, MD;  Location: [facility] ENDOSCOPY;  Service: Endoscopy;  Laterality: N/A;  . THYROIDECTOMY    . tumor removed      SOCIAL HISTORY: Social History   Socioeconomic History  . Marital status: Divorced    Spouse name: Not on file  . Number of children: Not on file  . Years of education: 58  . Highest education level: 12th grade  Occupational History  . Not on file  Social Needs  . Financial resource strain: Not hard at all  . Food insecurity:    Worry: Sometimes true    Inability: Never true  . Transportation needs:    Medical: No    Non-medical: No  Tobacco Use  . Smoking status: Never Smoker  . Smokeless tobacco: Never Used  Substance and Sexual Activity  . Alcohol use: Yes    Alcohol/week: 1.0 standard drinks    Types: 1 Glasses of wine per week    Comment: occ. wine  . Drug use: No  . Sexual activity: Not on file  Lifestyle  . Physical activity:    Days per week: 2 days    Minutes per session: 60 min  . Stress: Not at  all  Relationships  . Social connections:    Talks on phone: More than three times a week    Gets together: Never    Attends religious service: More than 4 times per year    Active member of club or organization: No    Attends meetings of clubs or organizations: Never    Relationship status: Divorced  . Intimate partner violence:    Fear of  current or ex partner: No    Emotionally abused: No    Physically abused: No    Forced sexual activity: No  Other Topics Concern  . Not on file  Social History Narrative  . Not on file    FAMILY HISTORY: Family History  Problem Relation Age of Onset  . Parkinson's disease Mother   . Heart disease Father   . Lupus Sister   . Aneurysm Son   . Lupus Son     ALLERGIES:  is allergic to ferumoxytol; other; and penicillins.  MEDICATIONS:  Current Outpatient Medications  Medication Sig Dispense Refill  . acetaminophen (TYLENOL) 500 MG tablet Take 1,000 mg by mouth every 6 (six) hours as needed for moderate pain.    Marland Kitchen aspirin EC 81 MG tablet Take 1 tablet (81 mg total) by mouth daily. 100 tablet 12  . baclofen (LIORESAL) 10 MG tablet Take 1-2 tablets (10-20 mg total) by mouth 2 (two) times daily as needed for muscle spasms. 60 each 1  . Blood Glucose Monitoring Suppl (ACCU-CHEK NANO SMARTVIEW) w/Device KIT Use to check blood glucose up to once daily as instructed 1 kit 0  . cholecalciferol (VITAMIN D) 1000 units tablet Take 5,000 Units by mouth daily.    . Cromolyn Sodium (NASAL ALLERGY NA) Place 1 spray into the nose daily as needed (allergies).    . diclofenac sodium (VOLTAREN) 1 % GEL Apply 2 g topically 3 (three) times daily as needed. On affected knee joints 100 g 5  . ferrous sulfate 325 (65 FE) MG tablet Take 1 tablet (325 mg total) by mouth 2 (two) times daily with a meal. 180 tablet 3  . glucose blood (ACCU-CHEK SMARTVIEW) test strip Check blood glucose up to once daily as instructed 100 each 5  . hydrochlorothiazide (HYDRODIURIL) 25 MG tablet Take 1 tablet (25 mg total) by mouth daily. 90 tablet 3  . lactobacillus acidophilus (BACID) TABS tablet Take 2 tablets by mouth 2 (two) times daily.    Marland Kitchen levocetirizine (XYZAL) 5 MG tablet Take 1 tablet (5 mg total) by mouth every evening. 90 tablet 3  . loratadine (CLARITIN) 10 MG tablet Take 10 mg by mouth daily.    Marland Kitchen losartan (COZAAR)  50 MG tablet Take 1 tablet (50 mg total) by mouth daily. 90 tablet 3  . metFORMIN (GLUCOPHAGE) 500 MG tablet TAKE 2 TABLETS BY MOUTH TWICE DAILY WITH MEALS 360 tablet 3  . simvastatin (ZOCOR) 20 MG tablet Take 1 tablet (20 mg total) by mouth daily at 6 PM. 90 tablet 3   No current facility-administered medications for this visit.       Marland Kitchen  PHYSICAL EXAMINATION: ECOG PERFORMANCE STATUS: 1 - Symptomatic but completely ambulatory  Vitals:   02/17/18 1000  BP: (!) 149/70  Pulse: 89  Resp: 18  Temp: (!) 97 F (36.1 C)   Filed Weights   02/17/18 0959  Weight: 224 lb 13.9 oz (102 kg)    Physical Exam  Constitutional: She is oriented to person, place, and time and well-developed, well-nourished, and in  no distress.  Patient is obese.  Accompanied by daughter.  HENT:  Head: Normocephalic and atraumatic.  Mouth/Throat: Oropharynx is clear and moist. No oropharyngeal exudate.  Eyes: Pupils are equal, round, and reactive to light.  Neck: Normal range of motion. Neck supple.  Cardiovascular: Normal rate and regular rhythm.  Pulmonary/Chest: No respiratory distress. She has no wheezes.  Abdominal: Soft. Bowel sounds are normal. She exhibits no distension and no mass. There is no tenderness. There is no rebound and no guarding.  Musculoskeletal: Normal range of motion. She exhibits no edema or tenderness.  Neurological: She is alert and oriented to person, place, and time.  Skin: Skin is warm.  Psychiatric: Affect normal.    LABORATORY DATA:  I have reviewed the data as listed Lab Results  Component Value Date   WBC 3.9 02/09/2018   HGB 11.2 (L) 02/09/2018   HCT 34.2 (L) 02/09/2018   MCV 94.0 02/09/2018   PLT 238 02/09/2018   Recent Labs    04/15/17 1004 07/21/17 0000 08/12/17 0951 12/05/17 1046 02/09/18 0752  NA 139 142 140 141 142  K 3.9 3.9 3.7 3.9 4.1  CL 103 106 103 104 106  CO2 '27 28 29 26 23  ' GLUCOSE 121* 150* 88 122* 125*  BUN 23* 18 18 31* 39*  CREATININE  0.96 0.91 0.84 1.06* 1.12*  CALCIUM 9.1 9.1 9.0 9.4 9.1  GFRNONAA 59* 63 >60 52* 48*  GFRAA >60 74 >60 60* 56*  PROT 8.3* 7.3  --   --  8.1  ALBUMIN 3.6  --   --   --  3.6  AST 32 33  --   --  35  ALT 20 19  --   --  24  ALKPHOS 54  --   --   --  48  BILITOT 0.5 0.4  --   --  0.7    ASSESSMENT & PLAN:  Iron deficiency anemia due to chronic blood loss # Iron deficiency anemia-most likely secondary to AVMs of the colon [colonoscopy November 2016]; hemoglobin around 11-other causes include mild CKD/question thalassemia minor trait.  # Today Hb 11.2/iron studies- N; continue PO iron; NO IV iron today.  Again discussed regarding bone marrow biopsy; however hold bone marrow biopsy unless hemoglobin continues to drop.  # Mild-mod intermittent neutropenia- ? Ethnic benign vs others. Asymtomatic.  Today white count is 3.9.  Stable.  # DISPOSITION:  # Follow up in 6 monthsMD/labs prior-CBC iron studies/ possible venofer.       Cammie Sickle, MD 02/17/2018 12:04 PM

## 2018-03-09 DIAGNOSIS — M1711 Unilateral primary osteoarthritis, right knee: Secondary | ICD-10-CM | POA: Diagnosis not present

## 2018-03-16 DIAGNOSIS — G8929 Other chronic pain: Secondary | ICD-10-CM | POA: Diagnosis not present

## 2018-03-16 DIAGNOSIS — M25561 Pain in right knee: Secondary | ICD-10-CM | POA: Diagnosis not present

## 2018-03-16 DIAGNOSIS — M1711 Unilateral primary osteoarthritis, right knee: Secondary | ICD-10-CM | POA: Diagnosis not present

## 2018-03-23 DIAGNOSIS — M1711 Unilateral primary osteoarthritis, right knee: Secondary | ICD-10-CM | POA: Diagnosis not present

## 2018-03-24 ENCOUNTER — Other Ambulatory Visit: Payer: Self-pay | Admitting: Family Medicine

## 2018-03-24 DIAGNOSIS — Z1231 Encounter for screening mammogram for malignant neoplasm of breast: Secondary | ICD-10-CM

## 2018-04-01 ENCOUNTER — Ambulatory Visit
Admission: RE | Admit: 2018-04-01 | Discharge: 2018-04-01 | Disposition: A | Discharge status: Home / Self Care | Payer: Medicare Other | Source: Ambulatory Visit | Attending: Family Medicine | Admitting: Family Medicine

## 2018-04-01 ENCOUNTER — Encounter (INDEPENDENT_AMBULATORY_CARE_PROVIDER_SITE_OTHER): Payer: Self-pay

## 2018-04-01 DIAGNOSIS — Z1231 Encounter for screening mammogram for malignant neoplasm of breast: Secondary | ICD-10-CM | POA: Diagnosis not present

## 2018-04-06 ENCOUNTER — Other Ambulatory Visit: Payer: Self-pay | Admitting: Family Medicine

## 2018-04-06 DIAGNOSIS — N6320 Unspecified lump in the left breast, unspecified quadrant: Secondary | ICD-10-CM

## 2018-04-06 DIAGNOSIS — R928 Other abnormal and inconclusive findings on diagnostic imaging of breast: Secondary | ICD-10-CM

## 2018-04-09 ENCOUNTER — Ambulatory Visit
Admission: RE | Admit: 2018-04-09 | Discharge: 2018-04-09 | Disposition: A | Discharge status: Home / Self Care | Payer: Medicare Other | Source: Ambulatory Visit | Attending: Family Medicine | Admitting: Family Medicine

## 2018-04-09 DIAGNOSIS — R928 Other abnormal and inconclusive findings on diagnostic imaging of breast: Secondary | ICD-10-CM | POA: Diagnosis not present

## 2018-04-09 DIAGNOSIS — N6320 Unspecified lump in the left breast, unspecified quadrant: Secondary | ICD-10-CM | POA: Diagnosis not present

## 2018-04-09 DIAGNOSIS — N6489 Other specified disorders of breast: Secondary | ICD-10-CM | POA: Diagnosis not present

## 2018-04-14 ENCOUNTER — Ambulatory Visit (INDEPENDENT_AMBULATORY_CARE_PROVIDER_SITE_OTHER): Payer: Medicare Other

## 2018-04-14 VITALS — BP 142/62 | HR 96 | Temp 98.9°F | Ht 60.0 in | Wt 225.0 lb

## 2018-04-14 DIAGNOSIS — Z0000 Encounter for general adult medical examination without abnormal findings: Secondary | ICD-10-CM

## 2018-04-14 NOTE — Progress Notes (Signed)
Subjective:   Diana Harmon is a 72 y.o. female who presents for Medicare Annual (Subsequent) preventive examination.  Review of Systems:  N/A  Cardiac Risk Factors include: advanced age (>107mn, >>54women);diabetes mellitus;dyslipidemia;hypertension;obesity (BMI >30kg/m2)     Objective:     Vitals: BP (!) 142/62 (BP Location: Right Arm)   Pulse 96   Temp 98.9 F (37.2 C) (Oral)   Ht 5' (1.524 m)   Wt 225 lb (102.1 kg)   BMI 43.94 kg/m   Body mass index is 43.94 kg/m.  Advanced Directives 04/14/2018 02/17/2018 08/19/2017 04/22/2017 04/08/2017 12/24/2016 08/20/2016  Does Patient Have a Medical Advance Directive? Yes _0  Yes  Type of [place] - - - - - Living will  Does patient want to make changes to medical advance directive? - - - - - - -  Copy of [place] Chart? No - copy requested - - - - - -  Would patient like information on creating a medical advance directive? - No - Patient declined Yes (MAU/Ambulatory/Procedural Areas - Information given) No - Patient declined Yes (MAU/Ambulatory/Procedural Areas - Information given) No - Patient declined -    Tobacco Social History   Tobacco Use  Smoking Status Never Smoker  Smokeless Tobacco Never Used     Counseling given: Not Answered   Clinical Intake:  Pre-visit preparation completed: Yes  Pain : No/denies pain Pain Score: 0-No pain(Knee pains with getting up and movement.)    Diabetes:  Is the patient diabetic?  Yes type 2 If diabetic, was a CBG obtained today?  No  Did the patient bring in their glucometer from home?  No  How often do you monitor your CBG's? Once a week.   Financial Strains and Diabetes Management:  Are you having any financial strains with the device, your supplies or your medication? No .  Does the patient want to be seen by Chronic Care Management for management of their diabetes?  No  Would the  patient like to be referred to a Nutritionist or for Diabetic Management?  No   Diabetic Exams:  Diabetic Eye Exam: Completed 06/18/17.   Diabetic Foot Exam: Completed 01/27/18.    Nutritional Status: BMI > 30  Obese Nutritional Risks: Nausea/ vomitting/ diarrhea(Diarrhea occasionally due to diabetes medication.)   How often do you need to have someone help you when you read instructions, pamphlets, or other written materials from your doctor or pharmacy?: 1 - Never  Interpreter Needed?: No  Information [facility] :: [facility] L[facility]  Past Medical History:  Diagnosis Date  . Anemia   . Aortic stenosis   . Benign tumor   . Nausea   . Patient is [name]   . Thyroid disease    Past Surgical History:  Procedure Laterality Date  . COLONOSCOPY    . COLONOSCOPY WITH PROPOFOL N/A 03/13/2015   Procedure: COLONOSCOPY WITH PROPOFOL;  Surgeon: RManya Silvas MD;  [facility]  Service: Endoscopy;  Laterality: N/A;  . ESOPHAGOGASTRODUODENOSCOPY N/A 03/13/2015   Procedure: ESOPHAGOGASTRODUODENOSCOPY (EGD);  Surgeon: RManya Silvas MD;  [facility];  Service: Endoscopy;  Laterality: N/A;  . THYROIDECTOMY    . tumor removed     Family History  Problem Relation Age of Onset  . Parkinson's disease Mother   . Heart disease Father   . Lupus Sister   . Aneurysm Son   . Lupus Son   .  Breast cancer Maternal Aunt        mat great aunt   Social History   Socioeconomic History  . Marital status: Divorced    Spouse name: Not on file  . Number of children: 1  . Years of education: 12  . Highest education level: 12th grade  Occupational History  . Occupation: retired  Social Needs  . Financial resource strain: Not hard at all  . Food insecurity:    Worry: Sometimes true    Inability: Never true  . Transportation needs:    Medical: No    Non-medical: No  Tobacco Use  . Smoking status: Never Smoker  . Smokeless tobacco: Never Used  Substance  and Sexual Activity  . Alcohol use: Yes    Alcohol/week: 0.0 - 1.0 standard drinks    Comment: occ. wine  . Drug use: No  . Sexual activity: Not on file  Lifestyle  . Physical activity:    Days per week: 0 days    Minutes per session: 0 min  . Stress: Not at all  Relationships  . Social connections:    Talks on phone: More than three times a week    Gets together: Never    Attends religious service: More than 4 times per year    Active member of club or organization: No    Attends meetings of clubs or organizations: Never    Relationship status: Divorced  Other Topics Concern  . Not on file  Social History Narrative  . Not on file    Outpatient Encounter Medications as of 04/14/2018  Medication Sig  . acetaminophen (TYLENOL) 500 MG tablet Take 1,000 mg by mouth every 6 (six) hours as needed for moderate pain.  . aspirin EC 81 MG tablet Take 1 tablet (81 mg total) by mouth daily.  . baclofen (LIORESAL) 10 MG tablet Take 1-2 tablets (10-20 mg total) by mouth 2 (two) times daily as needed for muscle spasms.  . Blood Glucose Monitoring Suppl (ACCU-CHEK NANO SMARTVIEW) w/Device KIT Use to check blood glucose up to once daily as instructed  . cholecalciferol (VITAMIN D) 1000 units tablet Take 2,000 Units by mouth daily.   . Cromolyn Sodium (NASAL ALLERGY NA) Place 1 spray into the nose daily as needed (allergies).  . diclofenac sodium (VOLTAREN) 1 % GEL Apply 2 g topically 3 (three) times daily as needed. On affected knee joints  . ferrous sulfate 325 (65 FE) MG tablet Take 1 tablet (325 mg total) by mouth 2 (two) times daily with a meal.  . glucose blood (ACCU-CHEK SMARTVIEW) test strip Check blood glucose up to once daily as instructed  . hydrochlorothiazide (HYDRODIURIL) 25 MG tablet Take 1 tablet (25 mg total) by mouth daily.  . levocetirizine (XYZAL) 5 MG tablet Take 1 tablet (5 mg total) by mouth every evening.  . losartan (COZAAR) 50 MG tablet Take 1 tablet (50 mg total) by  mouth daily.  . metFORMIN (GLUCOPHAGE) 500 MG tablet TAKE 2 TABLETS BY MOUTH TWICE DAILY WITH MEALS  . simvastatin (ZOCOR) 20 MG tablet Take 1 tablet (20 mg total) by mouth daily at 6 PM.  . lactobacillus acidophilus (BACID) TABS tablet Take 2 tablets by mouth 2 (two) times daily.  . loratadine (CLARITIN) 10 MG tablet Take 10 mg by mouth daily.   No facility-administered encounter medications on file as of 04/14/2018.     Activities of Daily Living In your present state of health, do you have any difficulty performing the   following activities: 04/14/2018  Hearing? Y  Comment Menieres disease  Vision? N  Difficulty concentrating or making decisions? N  Walking or climbing stairs? Y  Comment Due to knee pains.  Dressing or bathing? N  Doing errands, shopping? N  Preparing Food and eating ? N  Using the Toilet? N  In the past six months, have you accidently leaked urine? Y  Comment Occasional, does not wear protection yet.   Do you have problems with loss of bowel control? N  Managing your Medications? N  Managing your Finances? N  Housekeeping or managing your Housekeeping? Y  Comment Not currently cleaning, unable to stand for long periods of time due to knee pains.   Some recent data might be hidden    Patient Care Team: [name], Alexander J, DO as PCP - General (Family Medicine) Brahmanday, Govinda R, MD as Consulting Physician (Internal Medicine) Bell, Phillip T., MD (Ophthalmology) End, Christopher, MD as Consulting Physician (Cardiology)    Assessment:   This is a routine wellness examination for Diana Harmon.  Exercise Activities and Dietary recommendations Current Exercise Habits: The patient does not participate in regular exercise at present, Exercise limited by: orthopedic condition(s)  Goals    . DIET - INCREASE WATER INTAKE     Recommend drinking at least 6-8 glasses of water a day     . DIET - REDUCE SUGAR INTAKE     Recommend to cut out desserts in daily  diet to help aid in weight loss and control diabetes.        Fall Risk Fall Risk  04/14/2018 01/27/2018 12/25/2017 04/08/2017 08/28/2016  Falls in the past year? 1 No No No No  Number falls in past yr: 0 - - - -  Risk for fall due to : Other (Comment) - - - -  Risk for fall due to: Comment arthritis in knees and menieres disease - - - -  Follow up Falls prevention discussed - - - -   FALL RISK PREVENTION PERTAINING TO THE HOME:  Any stairs in or around the home WITH handrails? No  Home free of loose throw rugs in walkways, pet beds, electrical cords, etc? Yes  Adequate lighting in your home to reduce risk of falls? Yes   ASSISTIVE DEVICES UTILIZED TO PREVENT FALLS:  Life alert? No  Use of a cane, walker or w/c? Yes  Grab bars in the bathroom? No  Shower chair or bench in shower? Yes  Elevated toilet seat or a handicapped toilet? Yes    TIMED UP AND GO:  Was the test performed? No .     Depression Screen PHQ 2/9 Scores 04/14/2018 01/27/2018 12/25/2017 04/08/2017  PHQ - 2 Score 0 0 0 0     Cognitive Function     6CIT Screen 04/14/2018 04/08/2017  What Year? 0 points 0 points  What month? 0 points 0 points  What time? 0 points 0 points  Count back from 20 0 points 0 points  Months in reverse 0 points 0 points  Repeat phrase 6 points 0 points  Total Score 6 0    Immunization History  Administered Date(s) Administered  . Influenza, High Dose Seasonal PF 02/09/2014, 01/05/2015, 01/30/2016, 01/14/2017  . Influenza,inj,Quad PF,6+ Mos 04/05/2013  . Influenza-Unspecified 02/03/2014  . Pneumococcal Conjugate-13 08/05/2014  . Pneumococcal Polysaccharide-23 04/05/2013    Qualifies for Shingles Vaccine? Yes . Due for Shingrix. Education has been provided regarding the importance of this vaccine. Pt has been advised to call insurance   company to determine out of pocket expense. Advised may also receive vaccine at local pharmacy or Health Dept. Verbalized acceptance and  understanding.  Tdap: Up to date  Flu Vaccine: Up to date  Pneumococcal Vaccine: Up to date   Screening Tests Health Maintenance  Topic Date Due  . OPHTHALMOLOGY EXAM  06/18/2018  . HEMOGLOBIN A1C  07/28/2018  . FOOT EXAM  01/28/2019  . MAMMOGRAM  04/01/2020  . DEXA SCAN  05/18/2024  . TETANUS/TDAP  12/04/2024  . COLONOSCOPY  03/12/2025  . INFLUENZA VACCINE  Completed  . Hepatitis C Screening  Completed  . PNA vac Low Risk Adult  Completed    Cancer Screenings:  Colorectal Screening: Completed 03/13/15. Repeat every 10 years.  Mammogram: Completed 02/13/17.   Bone Density: Completed 05/18/14. Results reflect NORMAL.   Lung Cancer Screening: (Low Dose CT Chest recommended if Age 55-80 years, 30 pack-year currently smoking OR have quit w/in 15years.) does not qualify.    Additional Screening:  Hepatitis C Screening: Up to date  Vision Screening: Recommended annual ophthalmology exams for early detection of glaucoma and other disorders of the eye.  Dental Screening: Recommended annual dental exams for proper oral hygiene  Community Resource Referral:  CRR required this visit?  No       Plan:  I have personally reviewed and addressed the Medicare Annual Wellness questionnaire and have noted the following in the patient's chart:  A. Medical and social history B. Use of alcohol, tobacco or illicit drugs  C. Current medications and supplements D. Functional ability and status E.  Nutritional status F.  Physical activity G. Advance directives H. List of other physicians I.  Hospitalizations, surgeries, and ER visits in previous 12 months J.  Vitals K. Screenings such as hearing and vision if needed, cognitive and depression L. Referrals and appointments - none  In addition, I have reviewed and discussed with patient certain preventive protocols, quality metrics, and best practice recommendations. A written personalized care plan for preventive services as well  as general preventive health recommendations were provided to patient.  See attached scanned questionnaire for additional information.   Signed,  Mckenzie Markoski, LPN Nurse Health Advisor   Nurse Recommendations: None.  

## 2018-04-14 NOTE — Patient Instructions (Signed)
[name] , Thank you for taking time to come for your Medicare Wellness Visit. I appreciate your ongoing commitment to your health goals. Please review the following plan we discussed and let me know if I can assist you in the future.   Screening recommendations/referrals: Colonoscopy: Up to date, due 03/2025 Mammogram: Up to date, due 02/2019 Bone Density: Up to date, due 05/2024 Recommended yearly ophthalmology/optometry visit for glaucoma screening and checkup Recommended yearly dental visit for hygiene and checkup  Vaccinations: Influenza vaccine: Up to date Pneumococcal vaccine: Completed series Tdap vaccine: Up to date, due 08/2024 Shingles vaccine: Pt declines today.     Advanced directives: Please bring a copy of your POA (Power of Attorney) and/or Living Will to your next appointment.   Conditions/risks identified: Fall risk prevenetion; Obesity/Diabetes control- recommend to cut out desserts in daily diet to help aid in weight loss and control diabetes.   Next appointment: 04/19/18 @ 10:45 AM with [facility].   Preventive Care 13 Years and Older, Female Preventive care refers to lifestyle choices and visits with your health care provider that can promote health and wellness. What does preventive care include?  A yearly physical exam. This is also called an annual well check.  Dental exams once or twice a year.  Routine eye exams. Ask your health care provider how often you should have your eyes checked.  Personal lifestyle choices, including:  Daily care of your teeth and gums.  Regular physical activity.  Eating a healthy diet.  Avoiding tobacco and drug use.  Limiting alcohol use.  Practicing safe sex.  Taking low-dose aspirin every day.  Taking vitamin and mineral supplements as recommended by your health care provider. What happens during an annual well check? The services and screenings done by your health care provider during your annual well check will  depend on your age, overall health, lifestyle risk factors, and family history of disease. Counseling  Your health care provider may ask you questions about your:  Alcohol use.  Tobacco use.  Drug use.  Emotional well-being.  Home and relationship well-being.  Sexual activity.  Eating habits.  History of falls.  Memory and ability to understand (cognition).  Work and work Statistician.  Reproductive health. Screening  You may have the following tests or measurements:  Height, weight, and BMI.  Blood pressure.  Lipid and cholesterol levels. These may be checked every 5 years, or more frequently if you are over 20 years old.  Skin check.  Lung cancer screening. You may have this screening every year starting at age 97 if you have a 30-pack-year history of smoking and currently smoke or have quit within the past 15 years.  Fecal occult blood test (FOBT) of the stool. You may have this test every year starting at age 16.  Flexible sigmoidoscopy or colonoscopy. You may have a sigmoidoscopy every 5 years or a colonoscopy every 10 years starting at age 24.  Hepatitis C blood test.  Hepatitis B blood test.  Sexually transmitted disease (STD) testing.  Diabetes screening. This is done by checking your blood sugar (glucose) after you have not eaten for a while (fasting). You may have this done every 1-3 years.  Bone density scan. This is done to screen for osteoporosis. You may have this done starting at age 48.  Mammogram. This may be done every 1-2 years. Talk to your health care provider about how often you should have regular mammograms. Talk with your health care provider about your test  results, treatment options, and if necessary, the need for more tests. Vaccines  Your health care provider may recommend certain vaccines, such as:  Influenza vaccine. This is recommended every year.  Tetanus, diphtheria, and acellular pertussis (Tdap, Td) vaccine. You may need a  Td booster every 10 years.  Zoster vaccine. You may need this after age 60.  Pneumococcal 13-valent conjugate (PCV13) vaccine. One dose is recommended after age 90.  Pneumococcal polysaccharide (PPSV23) vaccine. One dose is recommended after age 56. Talk to your health care provider about which screenings and vaccines you need and how often you need them. This information is not intended to replace advice given to you by your health care provider. Make sure you discuss any questions you have with your health care provider. Document Released: 05/19/2015 Document Revised: 01/10/2016 Document Reviewed: 02/21/2015 Elsevier Interactive Patient Education  2017 [place] Prevention in the Home Falls can cause injuries. They can happen to people of all ages. There are many things you can do to make your home safe and to help prevent falls. What can I do on the outside of my home?  Regularly fix the edges of walkways and driveways and fix any cracks.  Remove anything that might make you trip as you walk through a door, such as a raised step or threshold.  Trim any bushes or trees on the path to your home.  Use bright outdoor lighting.  Clear any walking paths of anything that might make someone trip, such as rocks or tools.  Regularly check to see if handrails are loose or broken. Make sure that both sides of any steps have handrails.  Any raised decks and porches should have guardrails on the edges.  Have any leaves, snow, or ice cleared regularly.  Use sand or salt on walking paths during winter.  Clean up any spills in your garage right away. This includes oil or grease spills. What can I do in the bathroom?  Use night lights.  Install grab bars by the toilet and in the tub and shower. Do not use towel bars as grab bars.  Use non-skid mats or decals in the tub or shower.  If you need to sit down in the shower, use a plastic, non-slip stool.  Keep the floor dry. Clean  up any water that spills on the floor as soon as it happens.  Remove soap buildup in the tub or shower regularly.  Attach bath mats securely with double-sided non-slip rug tape.  Do not have throw rugs and other things on the floor that can make you trip. What can I do in the bedroom?  Use night lights.  Make sure that you have a light by your bed that is easy to reach.  Do not use any sheets or blankets that are too big for your bed. They should not hang down onto the floor.  Have a firm chair that has side arms. You can use this for support while you get dressed.  Do not have throw rugs and other things on the floor that can make you trip. What can I do in the kitchen?  Clean up any spills right away.  Avoid walking on wet floors.  Keep items that you use a lot in easy-to-reach places.  If you need to reach something above you, use a strong step stool that has a grab bar.  Keep electrical cords out of the way.  Do not use floor polish or wax that makes  floors slippery. If you must use wax, use non-skid floor wax.  Do not have throw rugs and other things on the floor that can make you trip. What can I do with my stairs?  Do not leave any items on the stairs.  Make sure that there are handrails on both sides of the stairs and use them. Fix handrails that are broken or loose. Make sure that handrails are as long as the stairways.  Check any carpeting to make sure that it is firmly attached to the stairs. Fix any carpet that is loose or worn.  Avoid having throw rugs at the top or bottom of the stairs. If you do have throw rugs, attach them to the floor with carpet tape.  Make sure that you have a light switch at the top of the stairs and the bottom of the stairs. If you do not have them, ask someone to add them for you. What else can I do to help prevent falls?  Wear shoes that:  Do not have high heels.  Have rubber bottoms.  Are comfortable and fit you well.  Are  closed at the toe. Do not wear sandals.  If you use a stepladder:  Make sure that it is fully opened. Do not climb a closed stepladder.  Make sure that both sides of the stepladder are locked into place.  Ask someone to hold it for you, if possible.  Clearly mark and make sure that you can see:  Any grab bars or handrails.  First and last steps.  Where the edge of each step is.  Use tools that help you move around (mobility aids) if they are needed. These include:  Canes.  Walkers.  Scooters.  Crutches.  Turn on the lights when you go into a dark area. Replace any light bulbs as soon as they burn out.  Set up your furniture so you have a clear path. Avoid moving your furniture around.  If any of your floors are uneven, fix them.  If there are any pets around you, be aware of where they are.  Review your medicines with your doctor. Some medicines can make you feel dizzy. This can increase your chance of falling. Ask your doctor what other things that you can do to help prevent falls. This information is not intended to replace advice given to you by your health care provider. Make sure you discuss any questions you have with your health care provider. Document Released: 02/16/2009 Document Revised: 09/28/2015 Document Reviewed: 05/27/2014 Elsevier Interactive Patient Education  2017 [employer].

## 2018-04-16 ENCOUNTER — Other Ambulatory Visit: Payer: Self-pay

## 2018-04-16 NOTE — Patient Outreach (Signed)
[facility]) Care Management  04/16/2018  Diana Harmon March 28, 1946 [id number]   Medication Adherence call to Diana Harmon spoke with patient she is due on Metformin 500 mg she is only taking 1 tablet 3 times a day not 2 tablet 2 times a day patient has medication at this time. Diana Harmon is showing past due under [place].  [place] Management Direct Dial [phone]  Fax [phone] [email or web address]

## 2018-04-23 IMAGING — MG DIGITAL SCREENING BILATERAL MAMMOGRAM WITH CAD
5 series · 5 of 5 positions shown · non-contrast
Comparison: Previous exam(s).

CLINICAL DATA: Screening.

EXAM:
DIGITAL SCREENING BILATERAL MAMMOGRAM WITH CAD

[R CC (1 of 2)]
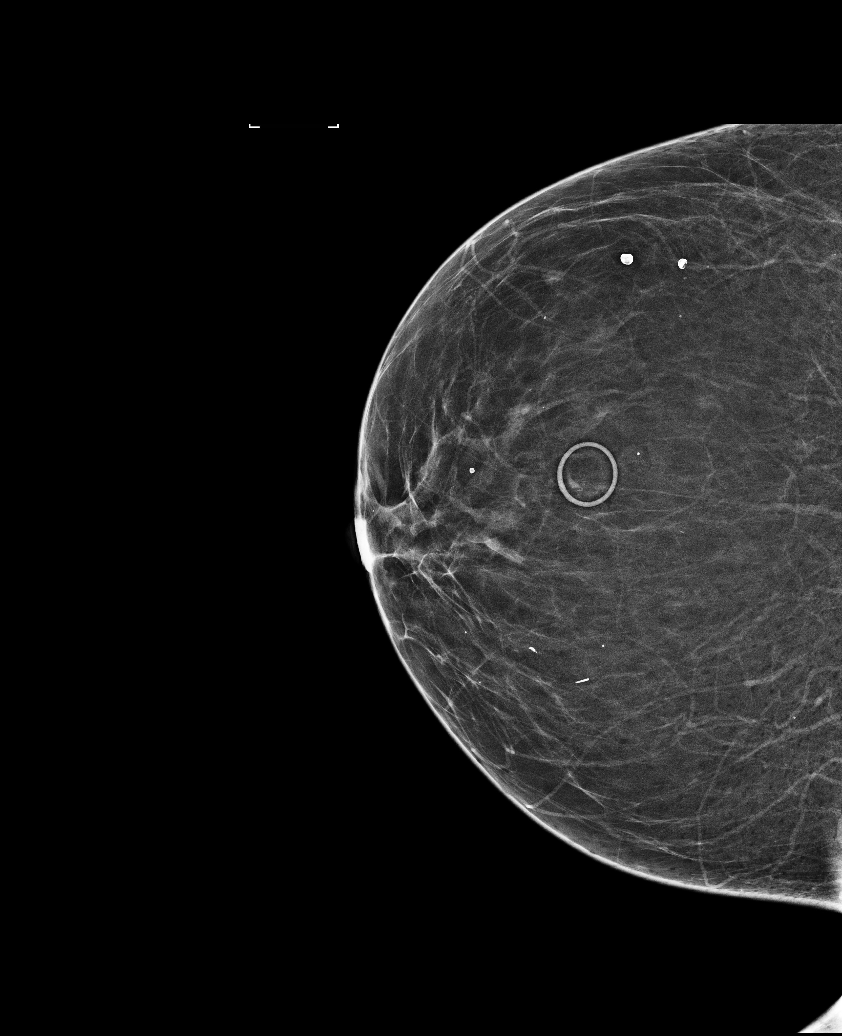

[L CC]
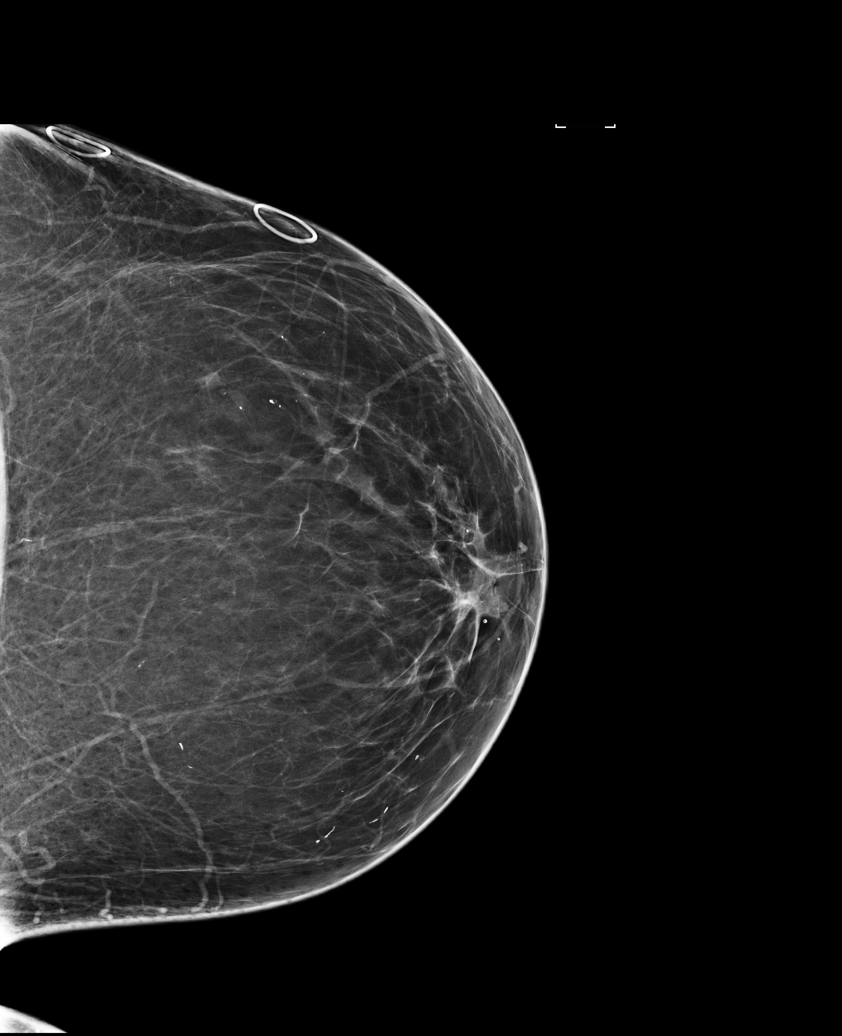

[L MLO]
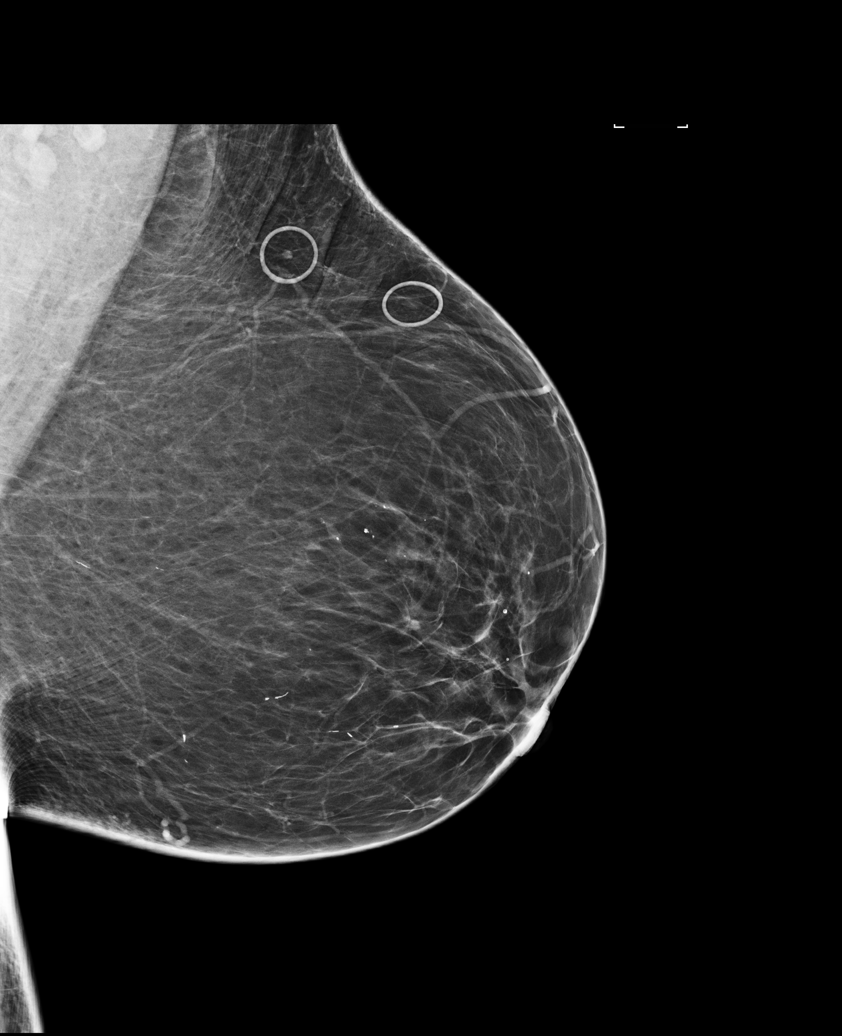

[R CC (2 of 2)]
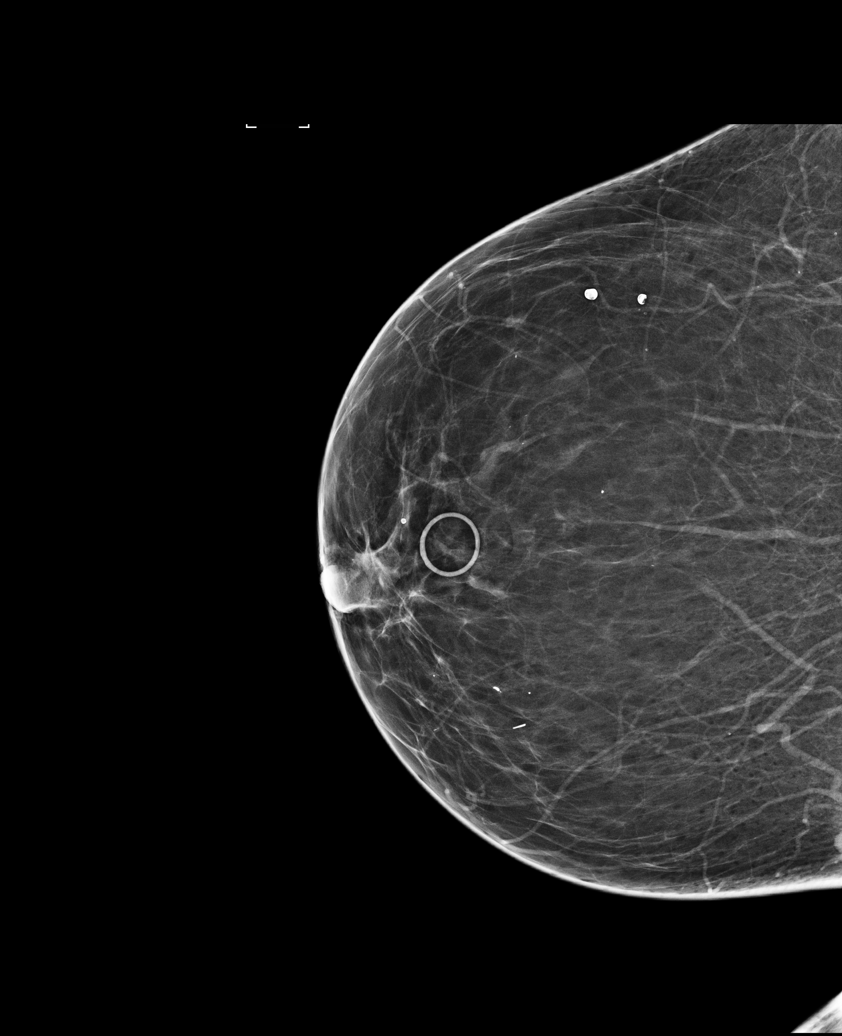

[R MLO]
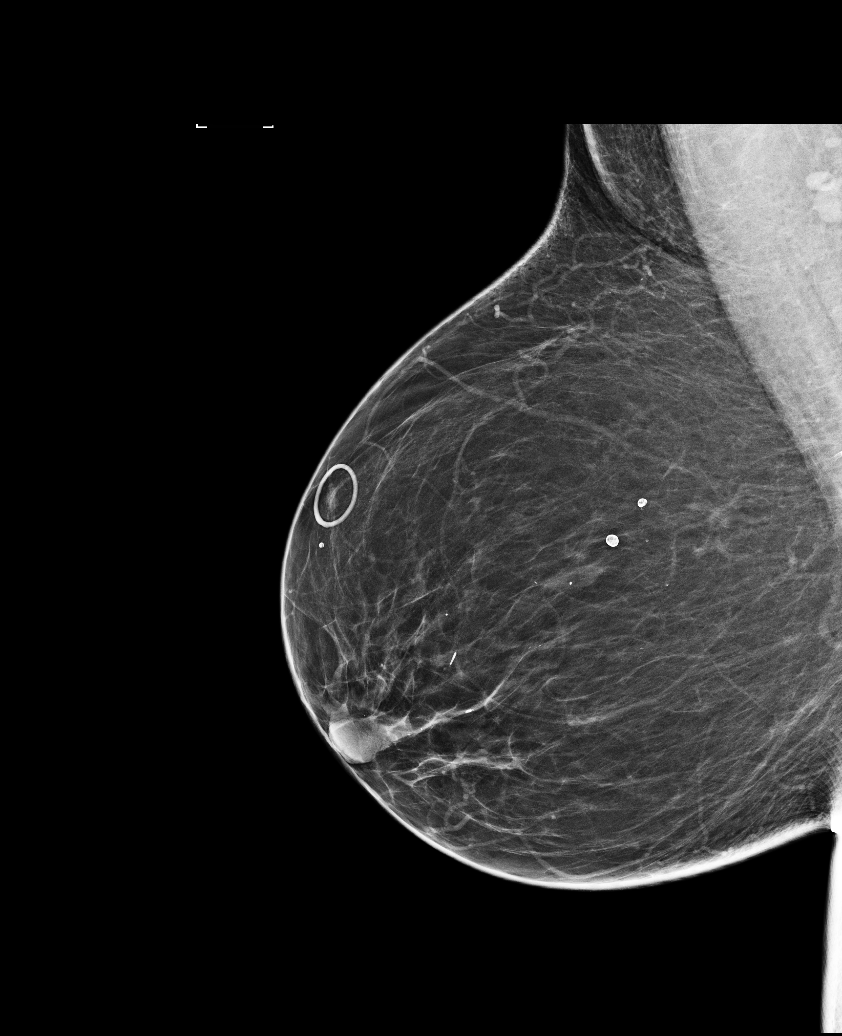

[5 of 5 positions shown; findings below may reference images not displayed]

ACR Breast Density Category b: There are scattered areas of
fibroglandular density.
FINDINGS: In the left breast, a possible asymmetry warrants further
evaluation. In the right breast, no findings suspicious for
malignancy. Images were processed with CAD.
IMPRESSION: Further evaluation is suggested for possible asymmetry in the left
breast.

RECOMMENDATION:
Diagnostic mammogram and possibly ultrasound of the left breast.
(Code:[id number]-[id number])

The patient will be contacted regarding the findings, and additional
imaging will be scheduled.

BI-RADS CATEGORY  0: Incomplete. Need additional imaging evaluation
and/or prior mammograms for comparison.

## 2018-04-25 ENCOUNTER — Other Ambulatory Visit: Payer: Self-pay

## 2018-04-25 ENCOUNTER — Encounter: Payer: Self-pay | Admitting: Gynecology

## 2018-04-25 ENCOUNTER — Ambulatory Visit
Admission: EM | Admit: 2018-04-25 | Discharge: 2018-04-25 | Disposition: A | Discharge status: Home / Self Care | Payer: Medicare Other | Attending: Emergency Medicine | Admitting: Emergency Medicine

## 2018-04-25 DIAGNOSIS — K047 Periapical abscess without sinus: Secondary | ICD-10-CM

## 2018-04-25 DIAGNOSIS — K05219 Aggressive periodontitis, localized, unspecified severity: Secondary | ICD-10-CM | POA: Insufficient documentation

## 2018-04-25 MED ORDER — CLINDAMYCIN HCL 300 MG PO CAPS: 300.0000 mg | ORAL_CAPSULE | Freq: Four times a day (QID) | ORAL | 0 refills | Status: AC

## 2018-04-25 MED ORDER — CHLORHEXIDINE GLUCONATE 0.12 % MT SOLN: OROMUCOSAL | 0 refills | Status: DC

## 2018-04-25 NOTE — ED Triage Notes (Signed)
Patient c/o abscess on her lower gum x couple days.

## 2018-04-25 NOTE — Discharge Instructions (Addendum)
clindamycin 300 mg 4 times daily for 7 days.  If the antibiotics even if you feel better.  Peridex or Listerine rinses.  500 mg of Tylenol with 200 mg of ibuprofen together 3 or 4 times a day.  Follow-up with your dentist in several days

## 2018-04-25 NOTE — ED Provider Notes (Signed)
HPI  SUBJECTIVE:  Diana Harmon is a 72 y.o. female who presents with painful swelling on her right lower frontal gingiva for the past several days where she had a tooth extracted last month.  Has not had any problems since having the tooth extracted.  She states that it is bleeding.  She denies purulent drainage.  She reports right lower jaw pain described as tight, burning, constant starting today.  No known trauma to the gum, no fevers, trismus, swelling under the tongue, facial swelling or swelling underneath the jaw.  No antibiotics in the past month, antipyretic in the past 4 to 6 hours.  She tried peroxide and salt water rinses without improvement in her symptoms.  No aggravating factors.  Past medical history of TIA, diabetes, hypertension.  She takes 81 mg aspirin daily.  She is not on any other anticoagulants or antiplatelets.  Her tetanus is up-to-date.  EHM:CNOBSJGGEZM, Devonne Doughty, DO dentist: Dr. Tomma Lightning.    Past Medical History:  Diagnosis Date  . Anemia   . Aortic stenosis   . Benign tumor   . Nausea   . Patient is [name]   . Thyroid disease     Past Surgical History:  Procedure Laterality Date  . COLONOSCOPY    . COLONOSCOPY WITH PROPOFOL N/A 03/13/2015   Procedure: COLONOSCOPY WITH PROPOFOL;  Surgeon: Manya Silvas, MD;  Location: [facility] ENDOSCOPY;  Service: Endoscopy;  Laterality: N/A;  . ESOPHAGOGASTRODUODENOSCOPY N/A 03/13/2015   Procedure: ESOPHAGOGASTRODUODENOSCOPY (EGD);  Surgeon: Manya Silvas, MD;  Location: [facility] ENDOSCOPY;  Service: Endoscopy;  Laterality: N/A;  . THYROIDECTOMY    . tumor removed      Family History  Problem Relation Age of Onset  . Parkinson's disease Mother   . Heart disease Father   . Lupus Sister   . Aneurysm Son   . Lupus Son   . Breast cancer Maternal Aunt        mat great aunt    Social History   Tobacco Use  . Smoking status: Never Smoker  . Smokeless tobacco: Never Used  Substance Use Topics   . Alcohol use: Yes    Alcohol/week: 0.0 - 1.0 standard drinks    Comment: occ. wine  . Drug use: No    No current facility-administered medications for this encounter.   Current Outpatient Medications:  .  acetaminophen (TYLENOL) 500 MG tablet, Take 1,000 mg by mouth every 6 (six) hours as needed for moderate pain., Disp: , Rfl:  .  aspirin EC 81 MG tablet, Take 1 tablet (81 mg total) by mouth daily., Disp: 100 tablet, Rfl: 12 .  baclofen (LIORESAL) 10 MG tablet, Take 1-2 tablets (10-20 mg total) by mouth 2 (two) times daily as needed for muscle spasms., Disp: 60 each, Rfl: 1 .  Blood Glucose Monitoring Suppl (ACCU-CHEK NANO SMARTVIEW) w/Device KIT, Use to check blood glucose up to once daily as instructed, Disp: 1 kit, Rfl: 0 .  cholecalciferol (VITAMIN D) 1000 units tablet, Take 2,000 Units by mouth daily. , Disp: , Rfl:  .  Cromolyn Sodium (NASAL ALLERGY NA), Place 1 spray into the nose daily as needed (allergies)., Disp: , Rfl:  .  diclofenac sodium (VOLTAREN) 1 % GEL, Apply 2 g topically 3 (three) times daily as needed. On affected knee joints, Disp: 100 g, Rfl: 5 .  ferrous sulfate 325 (65 FE) MG tablet, Take 1 tablet (325 mg total) by mouth 2 (two) times daily with a meal., Disp:  180 tablet, Rfl: 3 .  glucose blood (ACCU-CHEK SMARTVIEW) test strip, Check blood glucose up to once daily as instructed, Disp: 100 each, Rfl: 5 .  hydrochlorothiazide (HYDRODIURIL) 25 MG tablet, Take 1 tablet (25 mg total) by mouth daily., Disp: 90 tablet, Rfl: 3 .  lactobacillus acidophilus (BACID) TABS tablet, Take 2 tablets by mouth 2 (two) times daily., Disp: , Rfl:  .  levocetirizine (XYZAL) 5 MG tablet, Take 1 tablet (5 mg total) by mouth every evening., Disp: 90 tablet, Rfl: 3 .  loratadine (CLARITIN) 10 MG tablet, Take 10 mg by mouth daily., Disp: , Rfl:  .  losartan (COZAAR) 50 MG tablet, Take 1 tablet (50 mg total) by mouth daily., Disp: 90 tablet, Rfl: 3 .  metFORMIN (GLUCOPHAGE) 500 MG tablet,  TAKE 2 TABLETS BY MOUTH TWICE DAILY WITH MEALS, Disp: 360 tablet, Rfl: 3 .  simvastatin (ZOCOR) 20 MG tablet, Take 1 tablet (20 mg total) by mouth daily at 6 PM., Disp: 90 tablet, Rfl: 3 .  chlorhexidine (PERIDEX) 0.12 % solution, 15 mL swish and spit bid, Disp: 480 mL, Rfl: 0 .  clindamycin (CLEOCIN) 300 MG capsule, Take 1 capsule (300 mg total) by mouth 4 (four) times daily for 7 days., Disp: 28 capsule, Rfl: 0  Allergies  Allergen Reactions  . Ferumoxytol Swelling    Swelling in neck  . Other     Other reaction(s): Other (See Comments) She refuses blood products-Jehovah witness  . Penicillins Rash, Swelling and Hives     ROS  As noted in HPI.   Physical Exam  BP 140/63 (BP Location: Left Arm)   Pulse 83   Temp 98.3 F (36.8 C) (Oral)   Resp 16   Wt 102.1 kg   SpO2 98%   BMI 43.94 kg/m   Constitutional: Well developed, well nourished, no acute distress Eyes:  EOMI, conjunctiva normal bilaterally HENT: Normocephalic, atraumatic,mucus membranes moist.  Tooth #27, right lower cuspid absent.  Positive tender gingival swelling with central fluctuance.  No swelling underneath the tongue or jaw. Neck: No submental, cervical lymphadenopathy. Respiratory: Normal inspiratory effort Cardiovascular: Normal rate GI: nondistended skin: No rash, skin intact Musculoskeletal: no deformities Neurologic: Alert & oriented x 3, no focal neuro deficits Psychiatric: Speech and behavior appropriate   ED Course   Medications - No data to display  No orders of the defined types were placed in this encounter.   No results found for this or any previous visit (from the past 24 hour(s)). No results found.  ED Clinical Impression  Gingival abscess   ED Assessment/Plan  Procedure note: Had patient hold ice to the affected area for 30 seconds.  Then a single stab incision with an 18-gauge needle.  Expressed a copious amount of pus.  Patient tolerated procedure well.  Did not send off  cultures.  Patient reports penicillin/amoxicillin allergy, so we will send home with clindamycin 300 mg 4 times daily for 7 days. Peridex or Listerine rinses.  500 mg of Tylenol with 200 mg of ibuprofen together 3 or 4 times a day.  Follow-up with her dentist in several days if not getting any better, to the ER if she gets worse.  Discussed MDM, treatment plan, and plan for follow-up with patient. Discussed sn/sx that should prompt return to the ED. patient agrees with plan.   Meds ordered this encounter  Medications  . chlorhexidine (PERIDEX) 0.12 % solution    Sig: 15 mL swish and spit bid    Dispense:  480 mL    Refill:  0  . clindamycin (CLEOCIN) 300 MG capsule    Sig: Take 1 capsule (300 mg total) by mouth 4 (four) times daily for 7 days.    Dispense:  28 capsule    Refill:  0    *This clinic note was created using [employer]. Therefore, there may be occasional mistakes despite careful proofreading.   ?   Melynda Ripple, MD 04/25/18 1735

## 2018-05-07 ENCOUNTER — Other Ambulatory Visit: Payer: Self-pay

## 2018-05-07 NOTE — Patient Outreach (Signed)
[facility]) Care Management  05/07/2018  Diana Harmon Mar 02, 1946 [id number]   Medication Adherence to Diana Harmon spoke with patient she is due on Metformin 500 mg. spoke with patient last month she is only taking 1 tablet 3 times a day not 2 tablets 2 times a day. Diana Harmon is showing past due under [place].   [place] Management Direct Dial [phone]  Fax [phone] [email or web address]

## 2018-06-05 ENCOUNTER — Other Ambulatory Visit: Payer: Self-pay | Admitting: Family Medicine

## 2018-06-05 DIAGNOSIS — D649 Anemia, unspecified: Principal | ICD-10-CM

## 2018-06-05 DIAGNOSIS — D6489 Other specified anemias: Secondary | ICD-10-CM

## 2018-06-19 ENCOUNTER — Encounter: Payer: Self-pay | Admitting: Internal Medicine

## 2018-06-19 ENCOUNTER — Ambulatory Visit (INDEPENDENT_AMBULATORY_CARE_PROVIDER_SITE_OTHER): Payer: Medicare Other | Admitting: Internal Medicine

## 2018-06-19 VITALS — BP 130/80 | HR 88 | Ht 60.0 in | Wt 223.0 lb

## 2018-06-19 DIAGNOSIS — I350 Nonrheumatic aortic (valve) stenosis: Secondary | ICD-10-CM

## 2018-06-19 DIAGNOSIS — I10 Essential (primary) hypertension: Secondary | ICD-10-CM | POA: Diagnosis not present

## 2018-06-19 NOTE — Patient Instructions (Signed)
Medication Instructions:  Your physician recommends that you continue on your current medications as directed. Please refer to the Current Medication list given to you today.  If you need a refill on your cardiac medications before your next appointment, please call your pharmacy.   Lab work: none If you have labs (blood work) drawn today and your tests are completely normal, you will receive your results only by: Marland Kitchen MyChart Message (if you have MyChart) OR . A paper copy in the mail If you have any lab test that is abnormal or we need to change your treatment, we will call you to review the results.  Testing/Procedures:  In 1 year prior to your appointment with Dr End: Your physician has requested that you have an echocardiogram. Echocardiography is a painless test that uses sound waves to create images of your heart. It provides your doctor with information about the size and shape of your heart and how well your heart's chambers and valves are working. This procedure takes approximately one hour. There are no restrictions for this procedure. You may get an IV, if needed, to receive an ultrasound enhancing agent through to better visualize your heart.     Follow-Up: At [facility], you and your health needs are our priority.  As part of our continuing mission to provide you with exceptional heart care, we have created designated Provider Care Teams.  These Care Teams include your primary Cardiologist (physician) and Advanced Practice Providers (APPs -  Physician Assistants and Nurse Practitioners) who all work together to provide you with the care you need, when you need it. You will need a follow up appointment in 12 months.  Please call our office 2 months in advance to schedule this appointment.  You may see DR Harrell Gave END or one of the following Advanced Practice Providers on your designated Care Team:   Murray Hodgkins, NP Christell Faith, PA-C . Marrianne Mood, PA-C

## 2018-06-19 NOTE — Progress Notes (Signed)
Follow-up Outpatient Visit Date: 06/19/2018  Primary Care Provider: [name], DO [address] [address]  Chief Complaint: Follow-up aortic stenosis  HPI:  Ms. Daffron is a 73 y.o. year-old female with history of mild aortic stenosis, hypertension, hyperlipidemia, diabetes mellitus, and thyroid disease, who presents for follow-up of aortic stenosis.  I last saw her in July, at which time she was doing well other than mild chronic dependent edema and occasional orthostatic lightheadedness.  Repeat echo in 08/2017 had shown slight progression of aortic stenosis (no moderate).  Due to suboptimal blood pressure control, we agreed to increase HCTZ to 25 mg daily.  Today, Ms. Peyer reports doing well other than bilateral knee pain that she attributes to arthritis.  She recently received injections.  She has occasional dizziness that she attributes to her Meniere's disease.  She has not passed out or fallen.  She denies chest pain, shortness of breath, and palpitations.  Dependent edema has improved somewhat with escalation of HCTZ at our last visit.  She has stable 4-pillow orthopnea.  --------------------------------------------------------------------------------------------------  Past Medical History:  Diagnosis Date  . Anemia   . Aortic stenosis   . Benign tumor   . Nausea   . Patient is [name]   . Thyroid disease    Past Surgical History:  Procedure Laterality Date  . COLONOSCOPY    . COLONOSCOPY WITH PROPOFOL N/A 03/13/2015   Procedure: COLONOSCOPY WITH PROPOFOL;  Surgeon: Manya Silvas, MD;  Location: [facility] ENDOSCOPY;  Service: Endoscopy;  Laterality: N/A;  . ESOPHAGOGASTRODUODENOSCOPY N/A 03/13/2015   Procedure: ESOPHAGOGASTRODUODENOSCOPY (EGD);  Surgeon: Manya Silvas, MD;  Location: [facility] ENDOSCOPY;  Service: Endoscopy;  Laterality: N/A;  . THYROIDECTOMY    . tumor removed      Current Meds  Medication Sig  . acetaminophen (TYLENOL) 500  MG tablet Take 1,000 mg by mouth every 6 (six) hours as needed for moderate pain.  Marland Kitchen aspirin EC 81 MG tablet Take 1 tablet (81 mg total) by mouth daily.  . baclofen (LIORESAL) 10 MG tablet Take 1-2 tablets (10-20 mg total) by mouth 2 (two) times daily as needed for muscle spasms.  . Blood Glucose Monitoring Suppl (ACCU-CHEK NANO SMARTVIEW) w/Device KIT Use to check blood glucose up to once daily as instructed  . chlorhexidine (PERIDEX) 0.12 % solution 15 mL swish and spit bid  . cholecalciferol (VITAMIN D) 1000 units tablet Take 2,000 Units by mouth daily.   . Cromolyn Sodium (NASAL ALLERGY NA) Place 1 spray into the nose daily as needed (allergies).  . diclofenac sodium (VOLTAREN) 1 % GEL Apply 2 g topically 3 (three) times daily as needed. On affected knee joints  . Ferrous Sulfate (IRON) 325 (65 Fe) MG TABS TAKE 1 TABLET BY MOUTH TWICE DAILY WITH A MEAL  . glucose blood (ACCU-CHEK SMARTVIEW) test strip Check blood glucose up to once daily as instructed  . hydrochlorothiazide (HYDRODIURIL) 25 MG tablet Take 1 tablet (25 mg total) by mouth daily.  Marland Kitchen lactobacillus acidophilus (BACID) TABS tablet Take 2 tablets by mouth 2 (two) times daily.  Marland Kitchen levocetirizine (XYZAL) 5 MG tablet Take 1 tablet (5 mg total) by mouth every evening.  . loratadine (CLARITIN) 10 MG tablet Take 10 mg by mouth daily.  Marland Kitchen losartan (COZAAR) 50 MG tablet Take 1 tablet (50 mg total) by mouth daily.  . metFORMIN (GLUCOPHAGE) 500 MG tablet TAKE 2 TABLETS BY MOUTH TWICE DAILY WITH MEALS  . simvastatin (ZOCOR) 20 MG tablet  Take 1 tablet (20 mg total) by mouth daily at 6 PM.    Allergies: Ferumoxytol; Other; and Penicillins  Social History   Tobacco Use  . Smoking status: Never Smoker  . Smokeless tobacco: Never Used  Substance Use Topics  . Alcohol use: Yes    Alcohol/week: 0.0 - 1.0 standard drinks    Comment: occ. wine  . Drug use: No    Family History  Problem Relation Age of Onset  . Parkinson's disease Mother     . Heart disease Father   . Lupus Sister   . Aneurysm Son   . Lupus Son   . Breast cancer Maternal Aunt        mat great aunt    Review of Systems: A 12-system review of systems was performed and was negative except as noted in the HPI.  --------------------------------------------------------------------------------------------------  Physical Exam: BP 130/80 (BP Location: Left Arm, Patient Position: Sitting, Cuff Size: Normal)   Pulse 88   Ht 5' (1.524 m)   Wt 223 lb (101.2 kg)   BMI 43.55 kg/m   General:  NAD. HEENT: No conjunctival pallor or scleral icterus. Moist mucous membranes.  OP clear. Neck: Supple without lymphadenopathy, thyromegaly, JVD, or HJR.  Lungs: Normal work of breathing. Clear to auscultation bilaterally without wheezes or crackles. Heart: Regular rate and rhythm with 2/6 crescendo-decrescendo systolic murmur loudest at the RUSB.  No rubs or gallops. Non-displaced PMI. Abd: Bowel sounds present. Soft, NT/ND without hepatosplenomegaly Ext: 1+ ankle edema. Skin: Warm and dry without rash.  EKG:  NSR with RBBB  Lab Results  Component Value Date   WBC 3.9 02/09/2018   HGB 11.2 (L) 02/09/2018   HCT 34.2 (L) 02/09/2018   MCV 94.0 02/09/2018   PLT 238 02/09/2018    Lab Results  Component Value Date   NA 142 02/09/2018   K 4.1 02/09/2018   CL 106 02/09/2018   CO2 23 02/09/2018   BUN 39 (H) 02/09/2018   CREATININE 1.12 (H) 02/09/2018   GLUCOSE 125 (H) 02/09/2018   ALT 24 02/09/2018    Lab Results  Component Value Date   CHOL 163 07/21/2017   HDL 68 07/21/2017   LDLCALC 77 07/21/2017   TRIG 95 07/21/2017   CHOLHDL 2.4 07/21/2017    --------------------------------------------------------------------------------------------------  ASSESSMENT AND PLAN: Aortic stenosis Noted to be mild by echo in 08/2017.  No new symptoms to suggest severe AS.  We have agreed to repeat an echocardiogram in a year.  We will follow-up with her shortly  thereafter.  Hypertension Blood pressure borderline elevated today (goal less than 130/80).  We will continue current regimen.  I have encouraged sodium restriction.  Morbid obesity Weight loss encouraged through diet and exercise.  Follow-up: Return to clinic in 1 year with echo shortly before visit.  Nelva Bush, MD 06/19/2018 8:14 AM

## 2018-07-16 ENCOUNTER — Other Ambulatory Visit: Payer: Self-pay | Admitting: Family Medicine

## 2018-07-16 DIAGNOSIS — E7800 Pure hypercholesterolemia, unspecified: Secondary | ICD-10-CM

## 2018-08-14 ENCOUNTER — Inpatient Hospital Stay: Payer: Medicare Other

## 2018-08-18 ENCOUNTER — Ambulatory Visit: Payer: Medicare Other | Admitting: Internal Medicine

## 2018-08-18 ENCOUNTER — Inpatient Hospital Stay: Payer: Medicare Other | Admitting: Internal Medicine

## 2018-08-18 ENCOUNTER — Inpatient Hospital Stay: Payer: Medicare Other

## 2018-08-18 ENCOUNTER — Ambulatory Visit: Payer: Medicare Other

## 2018-10-15 DIAGNOSIS — E113393 Type 2 diabetes mellitus with moderate nonproliferative diabetic retinopathy without macular edema, bilateral: Secondary | ICD-10-CM | POA: Diagnosis not present

## 2018-10-16 ENCOUNTER — Other Ambulatory Visit: Payer: Medicare Other

## 2018-10-20 ENCOUNTER — Ambulatory Visit: Payer: Medicare Other | Admitting: Internal Medicine

## 2018-10-20 ENCOUNTER — Ambulatory Visit: Payer: Medicare Other

## 2018-11-06 ENCOUNTER — Other Ambulatory Visit: Payer: Self-pay | Admitting: Family Medicine

## 2018-11-06 DIAGNOSIS — I10 Essential (primary) hypertension: Secondary | ICD-10-CM

## 2018-11-11 ENCOUNTER — Other Ambulatory Visit: Payer: Self-pay

## 2018-11-11 DIAGNOSIS — D500 Iron deficiency anemia secondary to blood loss (chronic): Secondary | ICD-10-CM

## 2018-11-14 ENCOUNTER — Other Ambulatory Visit: Payer: Self-pay | Admitting: Internal Medicine

## 2018-11-16 ENCOUNTER — Other Ambulatory Visit: Payer: Self-pay

## 2018-11-16 ENCOUNTER — Inpatient Hospital Stay: Payer: Medicare Other | Attending: Hematology and Oncology

## 2018-11-16 DIAGNOSIS — D500 Iron deficiency anemia secondary to blood loss (chronic): Secondary | ICD-10-CM | POA: Insufficient documentation

## 2018-11-16 LAB — BASIC METABOLIC PANEL
Anion gap: 8 (ref 5–15)
BUN: 30 mg/dL — ABNORMAL HIGH (ref 8–23)
CO2: 23 mmol/L (ref 22–32)
Calcium: 9.3 mg/dL (ref 8.9–10.3)
Chloride: 106 mmol/L (ref 98–111)
Creatinine, Ser: 1.02 mg/dL — ABNORMAL HIGH (ref 0.44–1.00)
GFR calc Af Amer: >60 mL/min (ref >60)
GFR calc non Af Amer: 55 mL/min — ABNORMAL LOW (ref >60)
Glucose, Bld: 110 mg/dL — ABNORMAL HIGH (ref 70–99)
Potassium: 4.3 mmol/L (ref 3.5–5.1)
Sodium: 137 mmol/L (ref 135–145)

## 2018-11-16 LAB — CBC WITH DIFFERENTIAL/PLATELET
Abs Immature Granulocytes: 0.01 10*3/uL (ref 0.00–0.07)
Basophils Absolute: 0 10*3/uL (ref 0.0–0.1)
Basophils Relative: 1 %
Eosinophils Absolute: 0.2 10*3/uL (ref 0.0–0.5)
Eosinophils Relative: 6 %
HCT: 32.1 % — ABNORMAL LOW (ref 36.0–46.0)
Hemoglobin: 10.1 g/dL — ABNORMAL LOW (ref 12.0–15.0)
Immature Granulocytes: 0 %
Lymphocytes Relative: 49 %
Lymphs Abs: 1.7 10*3/uL (ref 0.7–4.0)
MCH: 29.9 pg (ref 26.0–34.0)
MCHC: 31.5 g/dL (ref 30.0–36.0)
MCV: 95 fL (ref 80.0–100.0)
Monocytes Absolute: 0.4 10*3/uL (ref 0.1–1.0)
Monocytes Relative: 11 %
Neutro Abs: 1.1 10*3/uL — ABNORMAL LOW (ref 1.7–7.7)
Neutrophils Relative %: 33 %
Platelets: 214 10*3/uL (ref 150–400)
RBC: 3.38 MIL/uL — ABNORMAL LOW (ref 3.87–5.11)
RDW: 13.9 % (ref 11.5–15.5)
WBC: 3.4 10*3/uL — ABNORMAL LOW (ref 4.0–10.5)
nRBC: 0 % (ref 0.0–0.2)

## 2018-11-16 LAB — FERRITIN: Ferritin: 96 ng/mL (ref 11–307)

## 2018-11-17 ENCOUNTER — Telehealth: Payer: Self-pay | Admitting: *Deleted

## 2018-11-17 DIAGNOSIS — D500 Iron deficiency anemia secondary to blood loss (chronic): Secondary | ICD-10-CM

## 2018-11-17 NOTE — Telephone Encounter (Signed)
Patient called asking if she needs to keep tomorrows appointment. She had labs drawn yesterday in [place]

## 2018-11-17 NOTE — Telephone Encounter (Signed)
Patient informed no appointment needed tomorrow and to expect a call from scheduling to reschedule for 3 months out

## 2018-11-17 NOTE — Telephone Encounter (Signed)
Chart reviewed, patient does not need any iv iron. md recommends  - Follow up in 3 monthsMD/labs prior-CBC iron studies/ferritin possible venofer.  Colette, please r/s her apts out for 3 months. Thanks.

## 2018-11-18 ENCOUNTER — Inpatient Hospital Stay: Payer: Medicare Other | Admitting: Internal Medicine

## 2018-11-18 ENCOUNTER — Inpatient Hospital Stay: Payer: Medicare Other

## 2019-01-08 ENCOUNTER — Other Ambulatory Visit: Payer: Self-pay

## 2019-01-08 NOTE — Patient Outreach (Signed)
[facility]) Care Management  01/08/2019  Diana Harmon 04/23/1946 [id number]   Medication Adherence call to Diana Harmon [facility] Identifiers Verify spoke with patient she is past due on Metformin 500 mg patient explain she is only taking 3 tablets a day 2 in the morning and 1 at night patient was taking 4 tablets daily but doctor told patient to take 3 tablets to decreased by 1 tablet patient has medication at this time and will order when due. Diana Harmon is showing past due under [place].   [place] Management Direct Dial [phone]  Fax [phone] [email or web address]

## 2019-01-12 ENCOUNTER — Other Ambulatory Visit: Payer: Self-pay

## 2019-01-12 ENCOUNTER — Ambulatory Visit (INDEPENDENT_AMBULATORY_CARE_PROVIDER_SITE_OTHER): Payer: Medicare Other

## 2019-01-12 DIAGNOSIS — Z23 Encounter for immunization: Secondary | ICD-10-CM

## 2019-01-14 ENCOUNTER — Telehealth: Payer: Self-pay

## 2019-01-14 NOTE — Telephone Encounter (Signed)
Patient called stating she received her flu shot the other day and now is is having vaginal itching.  No discharge reported.  Patient wanted to know if this was a side effect.

## 2019-01-14 NOTE — Telephone Encounter (Signed)
Patient advised that it was not the side effect of flu shot and if it is gets worst she can call the office and schedule an appointment.

## 2019-01-19 LAB — HEMOGLOBIN A1C: Hemoglobin A1C: 6.2 % — AB (ref 4.0–5.6)

## 2019-01-31 ENCOUNTER — Other Ambulatory Visit: Payer: Self-pay | Admitting: Family Medicine

## 2019-01-31 DIAGNOSIS — E7800 Pure hypercholesterolemia, unspecified: Secondary | ICD-10-CM

## 2019-01-31 DIAGNOSIS — E119 Type 2 diabetes mellitus without complications: Secondary | ICD-10-CM

## 2019-02-12 ENCOUNTER — Other Ambulatory Visit: Payer: Self-pay

## 2019-02-15 ENCOUNTER — Inpatient Hospital Stay: Payer: Medicare Other | Attending: Internal Medicine

## 2019-02-15 ENCOUNTER — Other Ambulatory Visit: Payer: Self-pay

## 2019-02-15 DIAGNOSIS — D500 Iron deficiency anemia secondary to blood loss (chronic): Secondary | ICD-10-CM

## 2019-02-15 DIAGNOSIS — Z7984 Long term (current) use of oral hypoglycemic drugs: Secondary | ICD-10-CM | POA: Diagnosis not present

## 2019-02-15 DIAGNOSIS — Z7982 Long term (current) use of aspirin: Secondary | ICD-10-CM | POA: Diagnosis not present

## 2019-02-15 DIAGNOSIS — D509 Iron deficiency anemia, unspecified: Secondary | ICD-10-CM | POA: Insufficient documentation

## 2019-02-15 DIAGNOSIS — Z79899 Other long term (current) drug therapy: Secondary | ICD-10-CM | POA: Insufficient documentation

## 2019-02-15 LAB — FERRITIN: Ferritin: 87 ng/mL (ref 11–307)

## 2019-02-15 LAB — CBC WITH DIFFERENTIAL/PLATELET
Abs Immature Granulocytes: 0.01 10*3/uL (ref 0.00–0.07)
Basophils Absolute: 0 10*3/uL (ref 0.0–0.1)
Basophils Relative: 1 %
Eosinophils Absolute: 0.2 10*3/uL (ref 0.0–0.5)
Eosinophils Relative: 4 %
HCT: 31.7 % — ABNORMAL LOW (ref 36.0–46.0)
Hemoglobin: 9.7 g/dL — ABNORMAL LOW (ref 12.0–15.0)
Immature Granulocytes: 0 %
Lymphocytes Relative: 45 %
Lymphs Abs: 1.8 10*3/uL (ref 0.7–4.0)
MCH: 28.9 pg (ref 26.0–34.0)
MCHC: 30.6 g/dL (ref 30.0–36.0)
MCV: 94.3 fL (ref 80.0–100.0)
Monocytes Absolute: 0.5 10*3/uL (ref 0.1–1.0)
Monocytes Relative: 12 %
Neutro Abs: 1.5 10*3/uL — ABNORMAL LOW (ref 1.7–7.7)
Neutrophils Relative %: 38 %
Platelets: 238 10*3/uL (ref 150–400)
RBC: 3.36 MIL/uL — ABNORMAL LOW (ref 3.87–5.11)
RDW: 13.9 % (ref 11.5–15.5)
WBC: 3.9 10*3/uL — ABNORMAL LOW (ref 4.0–10.5)
nRBC: 0 % (ref 0.0–0.2)

## 2019-02-15 LAB — IRON AND TIBC
Iron: 55 ug/dL (ref 28–170)
Saturation Ratios: 16 % (ref 10.4–31.8)
TIBC: 345 ug/dL (ref 250–450)
UIBC: 290 ug/dL

## 2019-02-16 ENCOUNTER — Other Ambulatory Visit: Payer: Self-pay

## 2019-02-17 ENCOUNTER — Inpatient Hospital Stay: Payer: Medicare Other

## 2019-02-17 ENCOUNTER — Telehealth: Payer: Self-pay | Admitting: Internal Medicine

## 2019-02-17 ENCOUNTER — Other Ambulatory Visit: Payer: Self-pay

## 2019-02-17 ENCOUNTER — Inpatient Hospital Stay: Payer: Medicare Other | Admitting: Internal Medicine

## 2019-02-17 DIAGNOSIS — D508 Other iron deficiency anemias: Secondary | ICD-10-CM

## 2019-02-17 DIAGNOSIS — Z7982 Long term (current) use of aspirin: Secondary | ICD-10-CM | POA: Diagnosis not present

## 2019-02-17 DIAGNOSIS — D649 Anemia, unspecified: Secondary | ICD-10-CM | POA: Insufficient documentation

## 2019-02-17 DIAGNOSIS — D500 Iron deficiency anemia secondary to blood loss (chronic): Secondary | ICD-10-CM

## 2019-02-17 DIAGNOSIS — D509 Iron deficiency anemia, unspecified: Secondary | ICD-10-CM | POA: Diagnosis not present

## 2019-02-17 DIAGNOSIS — Z7984 Long term (current) use of oral hypoglycemic drugs: Secondary | ICD-10-CM | POA: Diagnosis not present

## 2019-02-17 DIAGNOSIS — Z79899 Other long term (current) drug therapy: Secondary | ICD-10-CM | POA: Diagnosis not present

## 2019-02-17 MED ORDER — DIPHENHYDRAMINE HCL 50 MG/ML IJ SOLN
25.0000 mg | Freq: Once | INTRAMUSCULAR | Status: AC
  Administered 2019-02-17: 25 mg via INTRAVENOUS
  Filled 2019-02-17: qty 1

## 2019-02-17 MED ORDER — DEXAMETHASONE SODIUM PHOSPHATE 10 MG/ML IJ SOLN
4.0000 mg | Freq: Once | INTRAMUSCULAR | Status: AC
  Administered 2019-02-17: 4 mg via INTRAVENOUS
  Filled 2019-02-17: qty 1

## 2019-02-17 MED ORDER — IRON SUCROSE 20 MG/ML IV SOLN
200.0000 mg | Freq: Once | INTRAVENOUS | Status: AC
  Administered 2019-02-17: 15:00:00 200 mg via INTRAVENOUS
  Filled 2019-02-17: qty 10

## 2019-02-17 MED ORDER — SODIUM CHLORIDE 0.9 % IV SOLN
INTRAVENOUS | Status: DC
  Administered 2019-02-17: 14:00:00 via INTRAVENOUS
  Filled 2019-02-17: qty 250

## 2019-02-17 NOTE — Telephone Encounter (Signed)
Spoke to patient's daughter regarding slightly low blood count/discussion regarding bone marrow biopsy.  She is in agreement follow-up as planned

## 2019-02-17 NOTE — Progress Notes (Signed)
[facility] NOTE  Patient Care Team: [name], DO as PCP - General (Family Medicine) Cammie Sickle, MD as Consulting Physician (Internal Medicine) Lorelee Cover., MD (Ophthalmology) End, Harrell Gave, MD as Consulting Physician (Cardiology)  CHIEF COMPLAINTS/PURPOSE OF CONSULTATION:   # AUG 2016- IRON DEFICIENCY ANEMIA- Possible AVMs of colon [s/p Argon laser; Dr.Elliot;EGD-neg Nov 2016] s/p IV Ferriheme x2 [[id number]; ALLERGY]; Venofer IV [pre-meds]  # MILD LEUCOPENIA/NEUTROPENIA [ANC 1.2-1.3]-chronic ethnic neutropenia/asymptomatic  # Rectal well diff carcinoid [1.2CM; s/p polypectomy 2011]; Allergy- [facility] Phil[facility]a Course 2016]- neck/face swelling [improved with benadryl]  HISTORY OF PRESENTING ILLNESS:  Diana Harmon 73 y.o.  female is here for follow-up for her iron deficiency anemia; patient last iron was in December 2017.  Patient continues to be on p.o. iron.  No blood in stools or black or stools.  No nausea vomiting.  Mild fatigue.  No infection episodes.  No need for antibiotics.  Review of Systems  Constitutional: Negative for chills, diaphoresis, fever, malaise/fatigue and weight loss.  HENT: Negative for nosebleeds and sore throat.   Eyes: Negative for double vision.  Respiratory: Negative for cough, hemoptysis, sputum production, shortness of breath and wheezing.   Cardiovascular: Negative for chest pain, palpitations, orthopnea and leg swelling.  Gastrointestinal: Negative for abdominal pain, blood in stool, constipation, diarrhea, heartburn, melena, nausea and vomiting.  Genitourinary: Negative for dysuria, frequency and urgency.  Musculoskeletal: Negative for back pain and joint pain.  Skin: Negative.  Negative for itching and rash.  Neurological: Negative for dizziness, tingling, focal weakness, weakness and headaches.  Endo/Heme/Allergies: Does not bruise/bleed easily.  Psychiatric/Behavioral: Negative for  depression. The patient is not nervous/anxious and does not have insomnia.      MEDICAL HISTORY:  Past Medical History:  Diagnosis Date  . Anemia   . Aortic stenosis   . Benign tumor   . Nausea   . Patient is [name]   . Thyroid disease     SURGICAL HISTORY: Past Surgical History:  Procedure Laterality Date  . COLONOSCOPY    . COLONOSCOPY WITH PROPOFOL N/A 03/13/2015   Procedure: COLONOSCOPY WITH PROPOFOL;  Surgeon: Manya Silvas, MD;  Location: [facility] ENDOSCOPY;  Service: Endoscopy;  Laterality: N/A;  . ESOPHAGOGASTRODUODENOSCOPY N/A 03/13/2015   Procedure: ESOPHAGOGASTRODUODENOSCOPY (EGD);  Surgeon: Manya Silvas, MD;  Location: [facility] ENDOSCOPY;  Service: Endoscopy;  Laterality: N/A;  . THYROIDECTOMY    . tumor removed      SOCIAL HISTORY: Social History   Socioeconomic History  . Marital status: Divorced    Spouse name: Not on file  . Number of children: 1  . Years of education: 55  . Highest education level: 12th grade  Occupational History  . Occupation: retired  Scientific laboratory technician  . Financial resource strain: Not hard at all  . Food insecurity    Worry: Sometimes true    Inability: Never true  . Transportation needs    Medical: No    Non-medical: No  Tobacco Use  . Smoking status: Never Smoker  . Smokeless tobacco: Never Used  Substance and Sexual Activity  . Alcohol use: Yes    Alcohol/week: 0.0 - 1.0 standard drinks    Comment: occ. wine  . Drug use: No  . Sexual activity: Not on file  Lifestyle  . Physical activity    Days per week: 0 days    Minutes per session: 0 min  . Stress: Not at all  Relationships  . Social connections  Talks on phone: More than three times a week    Gets together: Never    Attends religious service: More than 4 times per year    Active member of club or organization: No    Attends meetings of clubs or organizations: Never    Relationship status: Divorced  . Intimate partner violence    Fear of current or  ex partner: No    Emotionally abused: No    Physically abused: No    Forced sexual activity: No  Other Topics Concern  . Not on file  Social History Narrative  . Not on file    FAMILY HISTORY: Family History  Problem Relation Age of Onset  . Parkinson's disease Mother   . Heart disease Father   . Lupus Sister   . Aneurysm Son   . Lupus Son   . Breast cancer Maternal Aunt        mat great aunt    ALLERGIES:  is allergic to ferumoxytol; other; and penicillins.  MEDICATIONS:  Current Outpatient Medications  Medication Sig Dispense Refill  . acetaminophen (TYLENOL) 500 MG tablet Take 1,000 mg by mouth every 6 (six) hours as needed for moderate pain.    Marland Kitchen aspirin EC 81 MG tablet Take 1 tablet (81 mg total) by mouth daily. 100 tablet 12  . baclofen (LIORESAL) 10 MG tablet Take 1-2 tablets (10-20 mg total) by mouth 2 (two) times daily as needed for muscle spasms. 60 each 1  . Blood Glucose Monitoring Suppl (ACCU-CHEK NANO SMARTVIEW) w/Device KIT Use to check blood glucose up to once daily as instructed 1 kit 0  . chlorhexidine (PERIDEX) 0.12 % solution 15 mL swish and spit bid 480 mL 0  . cholecalciferol (VITAMIN D) 1000 units tablet Take 2,000 Units by mouth daily.     . Cromolyn Sodium (NASAL ALLERGY NA) Place 1 spray into the nose daily as needed (allergies).    . diclofenac sodium (VOLTAREN) 1 % GEL Apply 2 g topically 3 (three) times daily as needed. On affected knee joints 100 g 5  . Ferrous Sulfate (IRON) 325 (65 Fe) MG TABS TAKE 1 TABLET BY MOUTH TWICE DAILY WITH A MEAL 180 each 1  . glucose blood (ACCU-CHEK SMARTVIEW) test strip Check blood glucose up to once daily as instructed 100 each 5  . hydrochlorothiazide (HYDRODIURIL) 25 MG tablet Take 1 tablet by mouth once daily 90 tablet 3  . lactobacillus acidophilus (BACID) TABS tablet Take 2 tablets by mouth 2 (two) times daily.    Marland Kitchen levocetirizine (XYZAL) 5 MG tablet Take 1 tablet (5 mg total) by mouth every evening. 90 tablet  3  . loratadine (CLARITIN) 10 MG tablet Take 10 mg by mouth daily.    Marland Kitchen losartan (COZAAR) 50 MG tablet Take 1 tablet by mouth once daily 90 tablet 1  . metFORMIN (GLUCOPHAGE) 500 MG tablet TAKE 2 TABLETS BY MOUTH TWICE DAILY WITH MEALS 360 tablet 1  . simvastatin (ZOCOR) 20 MG tablet TAKE 1 TABLET BY MOUTH ONCE DAILY AT 6 PM 90 tablet 1   No current facility-administered medications for this visit.    Facility-Administered Medications Ordered in Other Visits  Medication Dose Route Frequency Provider Last Rate Last Dose  . 0.9 %  sodium chloride infusion   Intravenous Continuous Cammie Sickle, MD   Stopped at 02/17/19 1449      .  PHYSICAL EXAMINATION: ECOG PERFORMANCE STATUS: 1 - Symptomatic but completely ambulatory  Vitals:   02/17/19 1300  BP: (!) 142/75  Pulse: 83  Temp: (!) 97.2 F (36.2 C)   Filed Weights   02/17/19 1300  Weight: 215 lb (97.5 kg)    Physical Exam  Constitutional: She is oriented to person, place, and time and well-developed, well-nourished, and in no distress.  Patient is obese.  HENT:  Head: Normocephalic and atraumatic.  Mouth/Throat: Oropharynx is clear and moist. No oropharyngeal exudate.  Eyes: Pupils are equal, round, and reactive to light.  Neck: Normal range of motion. Neck supple.  Cardiovascular: Normal rate and regular rhythm.  Pulmonary/Chest: No respiratory distress. She has no wheezes.  Abdominal: Soft. Bowel sounds are normal. She exhibits no distension and no mass. There is no abdominal tenderness. There is no rebound and no guarding.  Musculoskeletal: Normal range of motion.        General: No tenderness or edema.  Neurological: She is alert and oriented to person, place, and time.  Skin: Skin is warm.  Psychiatric: Affect normal.    LABORATORY DATA:  I have reviewed the data as listed Lab Results  Component Value Date   WBC 3.9 (L) 02/15/2019   HGB 9.7 (L) 02/15/2019   HCT 31.7 (L) 02/15/2019   MCV 94.3  02/15/2019   PLT 238 02/15/2019   Recent Labs    11/16/18 0816  NA 137  K 4.3  CL 106  CO2 23  GLUCOSE 110*  BUN 30*  CREATININE 1.02*  CALCIUM 9.3  GFRNONAA 55*  GFRAA >60    ASSESSMENT & PLAN:  Iron deficiency anemia due to chronic blood loss # Iron deficiency anemia-most likely secondary to AVMs of the colon [colonoscopy November 2016]; hemoglobin around 11.  Today hemoglobin is 9.7 worse.  #However today today hemoglobin is 9.7; iron studies show no obvious evidence of iron deficiency.  Discussed the possibility of low-grade MDS/although less likely clinically.  Patient in agreement to get a bone marrow biopsy.  #Again reviewed the rationale for bone marrow biopsy testing.  It would be done in the hospital/radiology department mostly under anesthesia.  Discussed the potential complications including but not limited to bleeding infection/trauma [although extremely rare]; pain post procedure-is usually tolerable.  Patient will sign the consent for the procedure with radiology.                                # Mild-mod intermittent neutropenia- ? Ethnic benign vs others. Asymtomatic.  Today white count is 3.9.  Stable.  #I will try to reach patient's daughter today.  # DISPOSITION:  # Venofer today.  # Bone marrow biopsy in 2 weeks # follow up with me in 4 weeks-MD;lab-cbc-Dr.B        Cammie Sickle, MD 02/17/2019 4:21 PM

## 2019-02-17 NOTE — Assessment & Plan Note (Addendum)
#  Iron deficiency anemia-most likely secondary to AVMs of the colon [colonoscopy November 2016]; hemoglobin around 11.  Today hemoglobin is 9.7 worse.  #However today today hemoglobin is 9.7; iron studies show no obvious evidence of iron deficiency.  Discussed the possibility of low-grade MDS/although less likely clinically.  Patient in agreement to get a bone marrow biopsy.  #Again reviewed the rationale for bone marrow biopsy testing.  It would be done in the hospital/radiology department mostly under anesthesia.  Discussed the potential complications including but not limited to bleeding infection/trauma [although extremely rare]; pain post procedure-is usually tolerable.  Patient will sign the consent for the procedure with radiology.                                # Mild-mod intermittent neutropenia- ? Ethnic benign vs others. Asymtomatic.  Today white count is 3.9.  Stable.  #I will try to reach patient's daughter today.  # DISPOSITION:  # Venofer today.  # Bone marrow biopsy in 2 weeks # follow up with me in 4 weeks-MD;lab-cbc-Dr.B

## 2019-02-23 ENCOUNTER — Telehealth: Payer: Self-pay | Admitting: *Deleted

## 2019-02-23 NOTE — Telephone Encounter (Signed)
Bone marrow biopsy arranged. Patient informed about bx date/time. Arrival time 730 am on 03/05/19. NPO after midnight and she may take routine bp/heart meds with a sip of water. Teach back process performed with patient/pt's daughter.

## 2019-03-03 NOTE — Progress Notes (Signed)
Spoke with patient today who is scheduled for BMB on 03/05/2019, questions answered regarding procedure as well as patient made aware to be NPO after MN prior to procedure. Aware also to be here for procedure at 0730 03/05/2019

## 2019-03-04 ENCOUNTER — Other Ambulatory Visit: Payer: Self-pay | Admitting: Radiology

## 2019-03-05 ENCOUNTER — Ambulatory Visit
Admission: RE | Admit: 2019-03-05 | Discharge: 2019-03-05 | Disposition: A | Discharge status: Home / Self Care | Payer: Medicare Other | Source: Ambulatory Visit | Attending: Internal Medicine | Admitting: Internal Medicine

## 2019-03-10 ENCOUNTER — Ambulatory Visit
Admission: RE | Admit: 2019-03-10 | Discharge: 2019-03-10 | Disposition: A | Discharge status: Home / Self Care | Payer: Medicare Other | Source: Ambulatory Visit | Attending: Internal Medicine | Admitting: Internal Medicine

## 2019-03-10 ENCOUNTER — Other Ambulatory Visit: Payer: Self-pay

## 2019-03-10 DIAGNOSIS — Z7984 Long term (current) use of oral hypoglycemic drugs: Secondary | ICD-10-CM | POA: Insufficient documentation

## 2019-03-10 DIAGNOSIS — Z803 Family history of malignant neoplasm of breast: Secondary | ICD-10-CM | POA: Insufficient documentation

## 2019-03-10 DIAGNOSIS — D72819 Decreased white blood cell count, unspecified: Secondary | ICD-10-CM | POA: Insufficient documentation

## 2019-03-10 DIAGNOSIS — D649 Anemia, unspecified: Secondary | ICD-10-CM

## 2019-03-10 DIAGNOSIS — Z8249 Family history of ischemic heart disease and other diseases of the circulatory system: Secondary | ICD-10-CM | POA: Insufficient documentation

## 2019-03-10 DIAGNOSIS — Z79899 Other long term (current) drug therapy: Secondary | ICD-10-CM | POA: Insufficient documentation

## 2019-03-10 DIAGNOSIS — Z7982 Long term (current) use of aspirin: Secondary | ICD-10-CM | POA: Diagnosis not present

## 2019-03-10 DIAGNOSIS — Z888 Allergy status to other drugs, medicaments and biological substances status: Secondary | ICD-10-CM | POA: Insufficient documentation

## 2019-03-10 DIAGNOSIS — Z880 Allergy status to penicillin: Secondary | ICD-10-CM | POA: Diagnosis not present

## 2019-03-10 DIAGNOSIS — D509 Iron deficiency anemia, unspecified: Secondary | ICD-10-CM | POA: Diagnosis not present

## 2019-03-10 DIAGNOSIS — D72820 Lymphocytosis (symptomatic): Secondary | ICD-10-CM | POA: Diagnosis not present

## 2019-03-10 LAB — CBC WITH DIFFERENTIAL/PLATELET
Abs Immature Granulocytes: 0.01 10*3/uL (ref 0.00–0.07)
Basophils Absolute: 0 10*3/uL (ref 0.0–0.1)
Basophils Relative: 1 %
Eosinophils Absolute: 0.1 10*3/uL (ref 0.0–0.5)
Eosinophils Relative: 4 %
HCT: 29.5 % — ABNORMAL LOW (ref 36.0–46.0)
Hemoglobin: 9.2 g/dL — ABNORMAL LOW (ref 12.0–15.0)
Immature Granulocytes: 0 %
Lymphocytes Relative: 46 %
Lymphs Abs: 1.6 10*3/uL (ref 0.7–4.0)
MCH: 29.5 pg (ref 26.0–34.0)
MCHC: 31.2 g/dL (ref 30.0–36.0)
MCV: 94.6 fL (ref 80.0–100.0)
Monocytes Absolute: 0.4 10*3/uL (ref 0.1–1.0)
Monocytes Relative: 11 %
Neutro Abs: 1.3 10*3/uL — ABNORMAL LOW (ref 1.7–7.7)
Neutrophils Relative %: 38 %
Platelets: 252 10*3/uL (ref 150–400)
RBC: 3.12 MIL/uL — ABNORMAL LOW (ref 3.87–5.11)
RDW: 14.1 % (ref 11.5–15.5)
WBC: 3.3 10*3/uL — ABNORMAL LOW (ref 4.0–10.5)
nRBC: 0 % (ref 0.0–0.2)

## 2019-03-10 LAB — GLUCOSE, CAPILLARY
Glucose-Capillary: 95 mg/dL (ref 70–99)
Glucose-Capillary: 110 mg/dL — ABNORMAL HIGH (ref 70–99)

## 2019-03-10 LAB — PROTIME-INR
INR: 1 (ref 0.8–1.2)
Prothrombin Time: 13.3 seconds (ref 11.4–15.2)

## 2019-03-10 MED ORDER — SODIUM CHLORIDE 0.9 % IV SOLN
INTRAVENOUS | Status: DC
  Administered 2019-03-10: 08:00:00 via INTRAVENOUS

## 2019-03-10 MED ORDER — MIDAZOLAM HCL 5 MG/5ML IJ SOLN
INTRAMUSCULAR | Status: AC
  Filled 2019-03-10: qty 5

## 2019-03-10 MED ORDER — FENTANYL CITRATE (PF) 100 MCG/2ML IJ SOLN
INTRAMUSCULAR | Status: AC | PRN
  Administered 2019-03-10 (×2): 50 ug via INTRAVENOUS

## 2019-03-10 MED ORDER — MIDAZOLAM HCL 2 MG/2ML IJ SOLN
INTRAMUSCULAR | Status: AC | PRN
  Administered 2019-03-10: 1 mg via INTRAVENOUS

## 2019-03-10 MED ORDER — HEPARIN SOD (PORK) LOCK FLUSH 100 UNIT/ML IV SOLN
INTRAVENOUS | Status: AC
  Filled 2019-03-10: qty 5

## 2019-03-10 MED ORDER — FENTANYL CITRATE (PF) 100 MCG/2ML IJ SOLN
INTRAMUSCULAR | Status: AC
  Filled 2019-03-10: qty 2

## 2019-03-10 NOTE — H&P (Signed)
Chief Complaint: Patient was seen in consultation today for leukopenia at the request of Brahmanday,Govinda R  Referring Physician(s): Charlaine Dalton R  Patient Status: [facility] - Out-pt  History of Present Illness: Diana Harmon is a 73 y.o. female with a history of iron deficiency anemia and chronic mild leukopenia.  Recent lab evaluation failed to demonstrate on-going iron deficiency and there is therefore concern for mild MDS.   Patient is here with her daughter and in good spirits.  She reports she is in her usual state of health and has no active complaints.   Past Medical History:  Diagnosis Date  . Anemia   . Aortic stenosis   . Benign tumor   . Nausea   . Patient is [name]   . Thyroid disease     Past Surgical History:  Procedure Laterality Date  . COLONOSCOPY    . COLONOSCOPY WITH PROPOFOL N/A 03/13/2015   Procedure: COLONOSCOPY WITH PROPOFOL;  Surgeon: Manya Silvas, MD;  Location: [facility] ENDOSCOPY;  Service: Endoscopy;  Laterality: N/A;  . ESOPHAGOGASTRODUODENOSCOPY N/A 03/13/2015   Procedure: ESOPHAGOGASTRODUODENOSCOPY (EGD);  Surgeon: Manya Silvas, MD;  Location: [facility] ENDOSCOPY;  Service: Endoscopy;  Laterality: N/A;  . THYROIDECTOMY    . tumor removed      Allergies: Ferumoxytol, Other, and Penicillins  Medications: Prior to Admission medications   Medication Sig Start Date End Date Taking? Authorizing Provider  acetaminophen (TYLENOL) 500 MG tablet Take 1,000 mg by mouth every 6 (six) hours as needed for moderate pain.   Yes [provider]  aspirin EC 81 MG tablet Take 1 tablet (81 mg total) by mouth daily. 07/04/16  Yes Arlis Porta., MD  baclofen (LIORESAL) 10 MG tablet Take 1-2 tablets (10-20 mg total) by mouth 2 (two) times daily as needed for muscle spasms. 07/24/17  Yes [name], Devonne Doughty, DO  Blood Glucose Monitoring Suppl (ACCU-CHEK NANO SMARTVIEW) w/Device KIT Use to check blood glucose up to once  daily as instructed 08/28/16  Yes [name], Devonne Doughty, DO  chlorhexidine (PERIDEX) 0.12 % solution 15 mL swish and spit bid 04/25/18  Yes Melynda Ripple, MD  cholecalciferol (VITAMIN D) 1000 units tablet Take 2,000 Units by mouth daily.    Yes [provider]  Cromolyn Sodium (NASAL ALLERGY NA) Place 1 spray into the nose daily as needed (allergies).   Yes [provider]  diclofenac sodium (VOLTAREN) 1 % GEL Apply 2 g topically 3 (three) times daily as needed. On affected knee joints 10/08/16  Yes [name], Devonne Doughty, DO  Ferrous Sulfate (IRON) 325 (65 Fe) MG TABS TAKE 1 TABLET BY MOUTH TWICE DAILY WITH A MEAL 06/05/18  Yes [name], Devonne Doughty, DO  glucose blood (ACCU-CHEK SMARTVIEW) test strip Check blood glucose up to once daily as instructed 08/28/16  Yes [name], Devonne Doughty, DO  hydrochlorothiazide (HYDRODIURIL) 25 MG tablet Take 1 tablet by mouth once daily 11/16/18  Yes End, Harrell Gave, MD  lactobacillus acidophilus (BACID) TABS tablet Take 2 tablets by mouth 2 (two) times daily.   Yes [provider]  levocetirizine (XYZAL) 5 MG tablet Take 1 tablet (5 mg total) by mouth every evening. 04/24/17  Yes [name], Devonne Doughty, DO  loratadine (CLARITIN) 10 MG tablet Take 10 mg by mouth daily.   Yes [provider]  losartan (COZAAR) 50 MG tablet Take 1 tablet by mouth once daily 11/07/18  Yes [name], Alexander J, DO  metFORMIN (GLUCOPHAGE) 500 MG tablet TAKE 2 TABLETS BY MOUTH TWICE  DAILY WITH MEALS 01/31/19  Yes [name], Devonne Doughty, DO  simvastatin (ZOCOR) 20 MG tablet TAKE 1 TABLET BY MOUTH ONCE DAILY AT 6 PM 01/31/19  Yes [name], Devonne Doughty, DO     Family History  Problem Relation Age of Onset  . Parkinson's disease Mother   . Heart disease Father   . Lupus Sister   . Aneurysm Son   . Lupus Son   . Breast cancer Maternal Aunt        mat great aunt    Social History   Socioeconomic History  . Marital status:  Divorced    Spouse name: Not on file  . Number of children: 1  . Years of education: 54  . Highest education level: 12th grade  Occupational History  . Occupation: retired  Scientific laboratory technician  . Financial resource strain: Not hard at all  . Food insecurity    Worry: Sometimes true    Inability: Never true  . Transportation needs    Medical: No    Non-medical: No  Tobacco Use  . Smoking status: Never Smoker  . Smokeless tobacco: Never Used  Substance and Sexual Activity  . Alcohol use: Yes    Alcohol/week: 0.0 - 1.0 standard drinks    Comment: occ. wine  . Drug use: No  . Sexual activity: Not on file  Lifestyle  . Physical activity    Days per week: 0 days    Minutes per session: 0 min  . Stress: Not at all  Relationships  . Social connections    Talks on phone: More than three times a week    Gets together: Never    Attends religious service: More than 4 times per year    Active member of club or organization: No    Attends meetings of clubs or organizations: Never    Relationship status: Divorced  Other Topics Concern  . Not on file  Social History Narrative  . Not on file     Review of Systems: A 12 point ROS discussed and pertinent positives are indicated in the HPI above.  All other systems are negative.  Review of Systems  Vital Signs: BP 125/68 (BP Location: Left Arm)   Pulse 77   Temp 98.6 F (37 C) (Oral)   Resp 12   Ht '5\' 2"'  (1.575 m)   Wt 94.3 kg   SpO2 98%   BMI 38.04 kg/m   Physical Exam Constitutional:      Appearance: Normal appearance.  HENT:     Head: Normocephalic and atraumatic.  Eyes:     General: No scleral icterus. Cardiovascular:     Rate and Rhythm: Normal rate and regular rhythm.  Pulmonary:     Effort: Pulmonary effort is normal.     Breath sounds: Normal breath sounds.  Abdominal:     General: Abdomen is flat.     Palpations: Abdomen is soft.  Skin:    General: Skin is warm and dry.  Neurological:     Mental Status: She  is alert and oriented to person, place, and time.  Psychiatric:        Mood and Affect: Mood normal.        Behavior: Behavior normal.     Imaging: No results found.  Labs:  CBC: Recent Labs    11/16/18 0816 02/15/19 0809 03/10/19 0756  WBC 3.4* 3.9* 3.3*  HGB 10.1* 9.7* 9.2*  HCT 32.1* 31.7* 29.5*  PLT 214 238 252  COAGS: Recent Labs    03/10/19 0756  INR 1.0    BMP: Recent Labs    11/16/18 0816  NA 137  K 4.3  CL 106  CO2 23  GLUCOSE 110*  BUN 30*  CALCIUM 9.3  CREATININE 1.02*  GFRNONAA 55*  GFRAA >60    LIVER FUNCTION TESTS: No results for input(s): BILITOT, AST, ALT, ALKPHOS, PROT, ALBUMIN in the last 8760 hours.  TUMOR MARKERS: No results for input(s): AFPTM, CEA, CA199, CHROMGRNA in the last 8760 hours.  Assessment and Plan:  73 year-old female with leukopenia of uncertain origin.  Risks and benefits of bone marrow biopsy was discussed with the patient and/or patient's family including, but not limited to bleeding, infection, damage to adjacent structures or low yield requiring additional tests.  All of the questions were answered and there is agreement to proceed.  Consent signed and in chart.   1.) Proceed with bone marrow biopsy.   Thank you for this interesting consult.  I greatly enjoyed meeting Emanuelle Hammerstrom Balthaser and look forward to participating in their care.  A copy of this report was sent to the requesting provider on this date.  Electronically Signed: Jacqulynn Cadet, MD 03/10/2019, 9:42 AM   I spent a total of  15 Minutes   in face to face in clinical consultation, greater than 50% of which was counseling/coordinating care for leukopenia.

## 2019-03-10 NOTE — CV Procedure (Signed)
Interventional Radiology Procedure Note  Procedure: CT guided aspirate and core biopsy of right iliac bone Complications: None Recommendations: - Bedrest supine x 1 hrs - Hydrocodone PRN  Pain - Follow biopsy results  Signed,  Beaulah Romanek K. Brylen Wagar, MD   

## 2019-03-10 NOTE — Progress Notes (Signed)
Patient clinically stable post BMB per Dr Laurence Ferrari. Tolerated well. Denies complaints. Vitals stable throughout procedure. Awake./alert and oriented. 2x2 dressing dry and intact to sacral area. Received Versed 1mg  along with Fentanyl 139mcg IV for procedure. REport given to [employer] in specials with questions answered.

## 2019-03-12 LAB — SURGICAL PATHOLOGY

## 2019-03-16 ENCOUNTER — Other Ambulatory Visit: Payer: Self-pay

## 2019-03-16 DIAGNOSIS — D649 Anemia, unspecified: Secondary | ICD-10-CM

## 2019-03-16 NOTE — Progress Notes (Signed)
Patient pre screened for office appointment, no questions or concerns today. Patient reminded of upcoming appointment time and date. 

## 2019-03-17 ENCOUNTER — Other Ambulatory Visit: Payer: Self-pay

## 2019-03-17 ENCOUNTER — Inpatient Hospital Stay: Payer: Medicare Other | Attending: Internal Medicine

## 2019-03-17 ENCOUNTER — Encounter (HOSPITAL_COMMUNITY): Payer: Self-pay | Admitting: Internal Medicine

## 2019-03-17 ENCOUNTER — Inpatient Hospital Stay (HOSPITAL_BASED_OUTPATIENT_CLINIC_OR_DEPARTMENT_OTHER): Payer: Medicare Other | Admitting: Internal Medicine

## 2019-03-17 VITALS — BP 126/61 | HR 86 | Temp 98.0°F | Resp 20 | Ht 62.0 in | Wt 196.0 lb

## 2019-03-17 DIAGNOSIS — R5383 Other fatigue: Secondary | ICD-10-CM

## 2019-03-17 DIAGNOSIS — Z791 Long term (current) use of non-steroidal anti-inflammatories (NSAID): Secondary | ICD-10-CM | POA: Insufficient documentation

## 2019-03-17 DIAGNOSIS — D500 Iron deficiency anemia secondary to blood loss (chronic): Secondary | ICD-10-CM

## 2019-03-17 DIAGNOSIS — D509 Iron deficiency anemia, unspecified: Secondary | ICD-10-CM | POA: Insufficient documentation

## 2019-03-17 DIAGNOSIS — Z803 Family history of malignant neoplasm of breast: Secondary | ICD-10-CM | POA: Diagnosis not present

## 2019-03-17 DIAGNOSIS — I10 Essential (primary) hypertension: Secondary | ICD-10-CM | POA: Diagnosis not present

## 2019-03-17 DIAGNOSIS — M549 Dorsalgia, unspecified: Secondary | ICD-10-CM | POA: Insufficient documentation

## 2019-03-17 DIAGNOSIS — Z79899 Other long term (current) drug therapy: Secondary | ICD-10-CM | POA: Insufficient documentation

## 2019-03-17 DIAGNOSIS — Z7982 Long term (current) use of aspirin: Secondary | ICD-10-CM | POA: Insufficient documentation

## 2019-03-17 DIAGNOSIS — I350 Nonrheumatic aortic (valve) stenosis: Secondary | ICD-10-CM | POA: Insufficient documentation

## 2019-03-17 DIAGNOSIS — E079 Disorder of thyroid, unspecified: Secondary | ICD-10-CM | POA: Diagnosis not present

## 2019-03-17 DIAGNOSIS — Z7984 Long term (current) use of oral hypoglycemic drugs: Secondary | ICD-10-CM | POA: Insufficient documentation

## 2019-03-17 DIAGNOSIS — D649 Anemia, unspecified: Secondary | ICD-10-CM

## 2019-03-17 DIAGNOSIS — E119 Type 2 diabetes mellitus without complications: Secondary | ICD-10-CM | POA: Insufficient documentation

## 2019-03-17 DIAGNOSIS — D508 Other iron deficiency anemias: Secondary | ICD-10-CM

## 2019-03-17 DIAGNOSIS — Z85048 Personal history of other malignant neoplasm of rectum, rectosigmoid junction, and anus: Secondary | ICD-10-CM | POA: Diagnosis not present

## 2019-03-17 DIAGNOSIS — M7989 Other specified soft tissue disorders: Secondary | ICD-10-CM | POA: Diagnosis not present

## 2019-03-17 DIAGNOSIS — M2550 Pain in unspecified joint: Secondary | ICD-10-CM | POA: Insufficient documentation

## 2019-03-17 LAB — CBC WITH DIFFERENTIAL/PLATELET
Abs Immature Granulocytes: 0.01 10*3/uL (ref 0.00–0.07)
Basophils Absolute: 0 10*3/uL (ref 0.0–0.1)
Basophils Relative: 1 %
Eosinophils Absolute: 0.1 10*3/uL (ref 0.0–0.5)
Eosinophils Relative: 4 %
HCT: 28.5 % — ABNORMAL LOW (ref 36.0–46.0)
Hemoglobin: 8.7 g/dL — ABNORMAL LOW (ref 12.0–15.0)
Immature Granulocytes: 0 %
Lymphocytes Relative: 45 %
Lymphs Abs: 1.4 10*3/uL (ref 0.7–4.0)
MCH: 29.7 pg (ref 26.0–34.0)
MCHC: 30.5 g/dL (ref 30.0–36.0)
MCV: 97.3 fL (ref 80.0–100.0)
Monocytes Absolute: 0.4 10*3/uL (ref 0.1–1.0)
Monocytes Relative: 13 %
Neutro Abs: 1.2 10*3/uL — ABNORMAL LOW (ref 1.7–7.7)
Neutrophils Relative %: 37 %
Platelets: 228 10*3/uL (ref 150–400)
RBC: 2.93 MIL/uL — ABNORMAL LOW (ref 3.87–5.11)
RDW: 14.2 % (ref 11.5–15.5)
WBC: 3.2 10*3/uL — ABNORMAL LOW (ref 4.0–10.5)
nRBC: 0 % (ref 0.0–0.2)

## 2019-03-17 NOTE — Patient Instructions (Signed)
CXR today.  

## 2019-03-17 NOTE — Assessment & Plan Note (Addendum)
#  Normocytic anemia unclear etiology; previous history of AVM iron deficiency; September 2020-iron studies negative for deficiency.  However continued anemia-worsening hemoglobin today is 8.7.  Patient [name]; not interested in blood transfusions.  #Reviewed the bone marrow biopsy-negative for any significant dyspoiesis or blasts.  Mildly hypercellular however not very concerning for MDS at this time.  Await FISH studies/cytogenetics.  #Discussed alternate reasons including autoimmune/versus other causes-conditions leading to anemia of chronic disease.  Get chest x-ray/CT of the abdomen pelvis.  If the above work-up is negative-would recommend further evaluation with rheumatology/autoimmune work-up.                             # Mild-mod intermittent neutropenia-benign ethnic neutropenia.  Asymtomatic.  Today white count is 3.2 stable.  I spoke at length with the patient/daughter- regarding the patient's clinical status/plan of care.  Family agreement.    # DISPOSITION:  # CT A/P ASAP; CXR today.  Will call with results. #  Follow up in 2 weeks- MD; cbc- Dr.B   Cc; Dr.K

## 2019-03-17 NOTE — Assessment & Plan Note (Deleted)
#   Iron deficiency anemia-most likely secondary to AVMs of the colon [colonoscopy November 2016]; hemoglobin around 11.  Today hemoglobin is 9.7 worse.  #However today today hemoglobin is 8.7;  Iron sat- 16%;   # ? Autoimmune-                             # Mild-mod intermittent neutropenia- ? Ethnic benign vs others. Asymtomatic.  Today white count is 3.9.   STABLE.   # DISPOSITION:  # CT A/P ASAP; CXR today.  #  Follow up in 2 weeks- MD; cbc- Dr.B

## 2019-03-17 NOTE — Progress Notes (Signed)
[facility] NOTE  Patient Care Team: [name], DO as PCP - General (Family Medicine) Cammie Sickle, MD as Consulting Physician (Internal Medicine) Lorelee Cover., MD (Ophthalmology) End, Harrell Gave, MD as Consulting Physician (Cardiology)  CHIEF COMPLAINTS/PURPOSE OF CONSULTATION:   # AUG 2016- IRON DEFICIENCY ANEMIA- Possible AVMs of colon [s/p Argon laser; Dr.Elliot;EGD-neg Nov 2016] s/p IV Ferriheme x2 [[id number]; ALLERGY]; Venofer IV [pre-meds]; worsening anemia not responding to iron- November 2020-bone marrow biopsy-erythroid hyperplasia but no dysplasia no blasts.   # MILD LEUCOPENIA/NEUTROPENIA [ANC 1.2-1.3]-chronic ethnic neutropenia/asymptomatic  # Rectal well diff carcinoid [1.2CM; s/p polypectomy 2011]; Allergy- [facility] Bouchard Philomena Course 2016]- neck/face swelling [improved with benadryl]  HISTORY OF PRESENTING ILLNESS:  Diana Harmon 73 y.o.  female is here for follow-up for her anemia.  Patient underwent bone marrow biopsy/is here to review the results.  Patient continues to feel tired.   Complains of intermittent joint pain back pain the last many months.  Also complains of swelling knees.  She is not taking any regular pain medication.  Patient continues on p.o. iron.  No nausea no vomiting.  No abdominal pain.  Review of Systems  Constitutional: Positive for malaise/fatigue. Negative for chills, diaphoresis, fever and weight loss.  HENT: Negative for nosebleeds and sore throat.   Eyes: Negative for double vision.  Respiratory: Negative for cough, hemoptysis, sputum production, shortness of breath and wheezing.   Cardiovascular: Negative for chest pain, palpitations, orthopnea and leg swelling.  Gastrointestinal: Negative for abdominal pain, blood in stool, constipation, diarrhea, heartburn, melena, nausea and vomiting.  Genitourinary: Negative for dysuria, frequency and urgency.  Musculoskeletal: Positive for back pain and  joint pain.  Skin: Negative.  Negative for itching and rash.  Neurological: Negative for dizziness, tingling, focal weakness, weakness and headaches.  Endo/Heme/Allergies: Does not bruise/bleed easily.  Psychiatric/Behavioral: Negative for depression. The patient is not nervous/anxious and does not have insomnia.      MEDICAL HISTORY:  Past Medical History:  Diagnosis Date  . Anemia   . Aortic stenosis   . Benign tumor   . Nausea   . Patient is [name]   . Thyroid disease     SURGICAL HISTORY: Past Surgical History:  Procedure Laterality Date  . COLONOSCOPY    . COLONOSCOPY WITH PROPOFOL N/A 03/13/2015   Procedure: COLONOSCOPY WITH PROPOFOL;  Surgeon: Manya Silvas, MD;  Location: [facility] ENDOSCOPY;  Service: Endoscopy;  Laterality: N/A;  . ESOPHAGOGASTRODUODENOSCOPY N/A 03/13/2015   Procedure: ESOPHAGOGASTRODUODENOSCOPY (EGD);  Surgeon: Manya Silvas, MD;  Location: [facility] ENDOSCOPY;  Service: Endoscopy;  Laterality: N/A;  . THYROIDECTOMY    . tumor removed      SOCIAL HISTORY: Social History   Socioeconomic History  . Marital status: Divorced    Spouse name: Not on file  . Number of children: 1  . Years of education: 27  . Highest education level: 12th grade  Occupational History  . Occupation: retired  Scientific laboratory technician  . Financial resource strain: Not hard at all  . Food insecurity    Worry: Sometimes true    Inability: Never true  . Transportation needs    Medical: No    Non-medical: No  Tobacco Use  . Smoking status: Never Smoker  . Smokeless tobacco: Never Used  Substance and Sexual Activity  . Alcohol use: Yes    Alcohol/week: 0.0 - 1.0 standard drinks    Comment: occ. wine  . Drug use: No  . Sexual activity: Not on file  Lifestyle  . Physical activity    Days per week: 0 days    Minutes per session: 0 min  . Stress: Not at all  Relationships  . Social connections    Talks on phone: More than three times a week    Gets together: Never     Attends religious service: More than 4 times per year    Active member of club or organization: No    Attends meetings of clubs or organizations: Never    Relationship status: Divorced  . Intimate partner violence    Fear of current or ex partner: No    Emotionally abused: No    Physically abused: No    Forced sexual activity: No  Other Topics Concern  . Not on file  Social History Narrative  . Not on file    FAMILY HISTORY: Family History  Problem Relation Age of Onset  . Parkinson's disease Mother   . Heart disease Father   . Lupus Sister   . Aneurysm Son   . Lupus Son   . Breast cancer Maternal Aunt        mat great aunt    ALLERGIES:  is allergic to ferumoxytol; other; and penicillins.  MEDICATIONS:  Current Outpatient Medications  Medication Sig Dispense Refill  . acetaminophen (TYLENOL) 500 MG tablet Take 1,000 mg by mouth every 6 (six) hours as needed for moderate pain.    Marland Kitchen aspirin EC 81 MG tablet Take 1 tablet (81 mg total) by mouth daily. 100 tablet 12  . baclofen (LIORESAL) 10 MG tablet Take 1-2 tablets (10-20 mg total) by mouth 2 (two) times daily as needed for muscle spasms. 60 each 1  . Blood Glucose Monitoring Suppl (ACCU-CHEK NANO SMARTVIEW) w/Device KIT Use to check blood glucose up to once daily as instructed 1 kit 0  . chlorhexidine (PERIDEX) 0.12 % solution 15 mL swish and spit bid 480 mL 0  . cholecalciferol (VITAMIN D) 1000 units tablet Take 2,000 Units by mouth daily.     . Cromolyn Sodium (NASAL ALLERGY NA) Place 1 spray into the nose daily as needed (allergies).    . diclofenac sodium (VOLTAREN) 1 % GEL Apply 2 g topically 3 (three) times daily as needed. On affected knee joints 100 g 5  . Ferrous Sulfate (IRON) 325 (65 Fe) MG TABS TAKE 1 TABLET BY MOUTH TWICE DAILY WITH A MEAL 180 each 1  . glucose blood (ACCU-CHEK SMARTVIEW) test strip Check blood glucose up to once daily as instructed 100 each 5  . hydrochlorothiazide (HYDRODIURIL) 25 MG tablet  Take 1 tablet by mouth once daily 90 tablet 3  . lactobacillus acidophilus (BACID) TABS tablet Take 2 tablets by mouth 2 (two) times daily.    Marland Kitchen levocetirizine (XYZAL) 5 MG tablet Take 1 tablet (5 mg total) by mouth every evening. 90 tablet 3  . loratadine (CLARITIN) 10 MG tablet Take 10 mg by mouth daily.    Marland Kitchen losartan (COZAAR) 50 MG tablet Take 1 tablet by mouth once daily 90 tablet 1  . metFORMIN (GLUCOPHAGE) 500 MG tablet TAKE 2 TABLETS BY MOUTH TWICE DAILY WITH MEALS 360 tablet 1  . simvastatin (ZOCOR) 20 MG tablet TAKE 1 TABLET BY MOUTH ONCE DAILY AT 6 PM 90 tablet 1   No current facility-administered medications for this visit.       Marland Kitchen  PHYSICAL EXAMINATION: ECOG PERFORMANCE STATUS: 1 - Symptomatic but completely ambulatory  Vitals:   03/17/19 1023  BP: 126/61  Pulse: 86  Resp: 20  Temp: 98 F (36.7 C)   Filed Weights   03/17/19 1023  Weight: 196 lb (88.9 kg)    Physical Exam  Constitutional: She is oriented to person, place, and time and well-developed, well-nourished, and in no distress.  Patient is obese.  In a wheelchair.  Accompanied by daughter.  HENT:  Head: Normocephalic and atraumatic.  Mouth/Throat: Oropharynx is clear and moist. No oropharyngeal exudate.  Eyes: Pupils are equal, round, and reactive to light.  Neck: Normal range of motion. Neck supple.  Cardiovascular: Normal rate and regular rhythm.  Pulmonary/Chest: Effort normal and breath sounds normal. No respiratory distress. She has no wheezes.  Abdominal: Soft. Bowel sounds are normal. She exhibits no distension and no mass. There is no abdominal tenderness. There is no rebound and no guarding.  Musculoskeletal: Normal range of motion.        General: No tenderness or edema.  Neurological: She is alert and oriented to person, place, and time.  Skin: Skin is warm.  Psychiatric: Affect normal.    LABORATORY DATA:  I have reviewed the data as listed Lab Results  Component Value Date   WBC 3.2  (L) 03/17/2019   HGB 8.7 (L) 03/17/2019   HCT 28.5 (L) 03/17/2019   MCV 97.3 03/17/2019   PLT 228 03/17/2019   Recent Labs    11/16/18 0816  NA 137  K 4.3  CL 106  CO2 23  GLUCOSE 110*  BUN 30*  CREATININE 1.02*  CALCIUM 9.3  GFRNONAA 55*  GFRAA >60    ASSESSMENT & PLAN:  Normocytic anemia #Normocytic anemia unclear etiology; previous history of AVM iron deficiency; September 2020-iron studies negative for deficiency.  However continued anemia-worsening hemoglobin today is 8.7.  Patient [name]; not interested in blood transfusions.  #Reviewed the bone marrow biopsy-negative for any significant dyspoiesis or blasts.  Mildly hypercellular however not very concerning for MDS at this time.  Await FISH studies/cytogenetics.  #Discussed alternate reasons including autoimmune/versus other causes-conditions leading to anemia of chronic disease.  Get chest x-ray/CT of the abdomen pelvis.  If the above work-up is negative-would recommend further evaluation with rheumatology/autoimmune work-up.                             # Mild-mod intermittent neutropenia-benign ethnic neutropenia.  Asymtomatic.  Today white count is 3.2 stable.  I spoke at length with the patient/daughter- regarding the patient's clinical status/plan of care.  Family agreement.    # DISPOSITION:  # CT A/P ASAP; CXR today.  Will call with results. #  Follow up in 2 weeks- MD; cbc- Dr.B   Cc; Dr.K         Cammie Sickle, MD 03/17/2019 11:38 AM

## 2019-03-25 ENCOUNTER — Ambulatory Visit
Admission: RE | Admit: 2019-03-25 | Discharge: 2019-03-25 | Disposition: A | Discharge status: Home / Self Care | Payer: Medicare Other | Source: Ambulatory Visit | Attending: Internal Medicine | Admitting: Internal Medicine

## 2019-03-25 ENCOUNTER — Other Ambulatory Visit: Payer: Self-pay

## 2019-03-25 DIAGNOSIS — K5730 Diverticulosis of large intestine without perforation or abscess without bleeding: Secondary | ICD-10-CM | POA: Diagnosis not present

## 2019-03-25 DIAGNOSIS — M47816 Spondylosis without myelopathy or radiculopathy, lumbar region: Secondary | ICD-10-CM | POA: Insufficient documentation

## 2019-03-25 DIAGNOSIS — I700 Atherosclerosis of aorta: Secondary | ICD-10-CM | POA: Insufficient documentation

## 2019-03-25 DIAGNOSIS — R5383 Other fatigue: Secondary | ICD-10-CM | POA: Diagnosis not present

## 2019-03-25 DIAGNOSIS — D649 Anemia, unspecified: Secondary | ICD-10-CM | POA: Insufficient documentation

## 2019-03-25 DIAGNOSIS — R079 Chest pain, unspecified: Secondary | ICD-10-CM | POA: Diagnosis not present

## 2019-03-25 LAB — POCT I-STAT CREATININE: Creatinine, Ser: 0.9 mg/dL (ref 0.44–1.00)

## 2019-03-25 MED ORDER — IOHEXOL 300 MG/ML  SOLN
100.0000 mL | Freq: Once | INTRAMUSCULAR | Status: AC | PRN
  Administered 2019-03-25: 100 mL via INTRAVENOUS

## 2019-03-30 ENCOUNTER — Encounter: Payer: Self-pay | Admitting: Internal Medicine

## 2019-03-30 ENCOUNTER — Inpatient Hospital Stay: Payer: Medicare Other

## 2019-03-30 ENCOUNTER — Inpatient Hospital Stay (HOSPITAL_BASED_OUTPATIENT_CLINIC_OR_DEPARTMENT_OTHER): Payer: Medicare Other | Admitting: Internal Medicine

## 2019-03-30 ENCOUNTER — Other Ambulatory Visit: Payer: Self-pay

## 2019-03-30 ENCOUNTER — Other Ambulatory Visit: Payer: Self-pay | Admitting: Internal Medicine

## 2019-03-30 DIAGNOSIS — Z7982 Long term (current) use of aspirin: Secondary | ICD-10-CM | POA: Diagnosis not present

## 2019-03-30 DIAGNOSIS — Z79899 Other long term (current) drug therapy: Secondary | ICD-10-CM | POA: Diagnosis not present

## 2019-03-30 DIAGNOSIS — D509 Iron deficiency anemia, unspecified: Secondary | ICD-10-CM | POA: Diagnosis not present

## 2019-03-30 DIAGNOSIS — I350 Nonrheumatic aortic (valve) stenosis: Secondary | ICD-10-CM | POA: Diagnosis not present

## 2019-03-30 DIAGNOSIS — D649 Anemia, unspecified: Secondary | ICD-10-CM

## 2019-03-30 DIAGNOSIS — I10 Essential (primary) hypertension: Secondary | ICD-10-CM | POA: Diagnosis not present

## 2019-03-30 DIAGNOSIS — D508 Other iron deficiency anemias: Secondary | ICD-10-CM

## 2019-03-30 DIAGNOSIS — E079 Disorder of thyroid, unspecified: Secondary | ICD-10-CM | POA: Diagnosis not present

## 2019-03-30 DIAGNOSIS — M549 Dorsalgia, unspecified: Secondary | ICD-10-CM | POA: Diagnosis not present

## 2019-03-30 DIAGNOSIS — Z803 Family history of malignant neoplasm of breast: Secondary | ICD-10-CM | POA: Diagnosis not present

## 2019-03-30 DIAGNOSIS — E119 Type 2 diabetes mellitus without complications: Secondary | ICD-10-CM | POA: Diagnosis not present

## 2019-03-30 DIAGNOSIS — M7989 Other specified soft tissue disorders: Secondary | ICD-10-CM | POA: Diagnosis not present

## 2019-03-30 DIAGNOSIS — Z791 Long term (current) use of non-steroidal anti-inflammatories (NSAID): Secondary | ICD-10-CM | POA: Diagnosis not present

## 2019-03-30 DIAGNOSIS — M2550 Pain in unspecified joint: Secondary | ICD-10-CM | POA: Diagnosis not present

## 2019-03-30 DIAGNOSIS — Z85048 Personal history of other malignant neoplasm of rectum, rectosigmoid junction, and anus: Secondary | ICD-10-CM | POA: Diagnosis not present

## 2019-03-30 DIAGNOSIS — Z7984 Long term (current) use of oral hypoglycemic drugs: Secondary | ICD-10-CM | POA: Diagnosis not present

## 2019-03-30 LAB — CBC WITH DIFFERENTIAL/PLATELET
Abs Immature Granulocytes: 0 10*3/uL (ref 0.00–0.07)
Basophils Absolute: 0 10*3/uL (ref 0.0–0.1)
Basophils Relative: 1 %
Eosinophils Absolute: 0.1 10*3/uL (ref 0.0–0.5)
Eosinophils Relative: 3 %
HCT: 27.8 % — ABNORMAL LOW (ref 36.0–46.0)
Hemoglobin: 8.3 g/dL — ABNORMAL LOW (ref 12.0–15.0)
Immature Granulocytes: 0 %
Lymphocytes Relative: 39 %
Lymphs Abs: 1.1 10*3/uL (ref 0.7–4.0)
MCH: 29.5 pg (ref 26.0–34.0)
MCHC: 29.9 g/dL — ABNORMAL LOW (ref 30.0–36.0)
MCV: 98.9 fL (ref 80.0–100.0)
Monocytes Absolute: 0.3 10*3/uL (ref 0.1–1.0)
Monocytes Relative: 9 %
Neutro Abs: 1.4 10*3/uL — ABNORMAL LOW (ref 1.7–7.7)
Neutrophils Relative %: 48 %
Platelets: 230 10*3/uL (ref 150–400)
RBC: 2.81 MIL/uL — ABNORMAL LOW (ref 3.87–5.11)
RDW: 14.5 % (ref 11.5–15.5)
WBC: 2.9 10*3/uL — ABNORMAL LOW (ref 4.0–10.5)
nRBC: 0 % (ref 0.0–0.2)

## 2019-03-30 LAB — C-REACTIVE PROTEIN: CRP: <0.8 mg/dL (ref <1.0)

## 2019-03-30 NOTE — Assessment & Plan Note (Addendum)
#  Normocytic anemia unclear etiology; previous history of AVM iron deficiency; September 2020-iron studies negative for deficiency.  However continued anemia-worsening hemoglobin today is 8.3.  Patient [name]; declines blood transfusions.; Bone marrow biopsy-negative for any significant dyspoiesis or blasts.  Mildly hypercellular however not very concerning for MDS at this time; FISH/cytogenetics negative.  #Autoimmune work-up from today is pending; CT scan abdomen pelvis negative for any explanation for anemia.  Chest x-ray negative.                             # Mild-mod intermittent neutropenia-benign ethnic neutropenia.  Asymtomatic.  White count 2.9.  I spoke at length with the patient/daughter- regarding the patient's clinical status/plan of care.  Family agreement.   # DISPOSITION: will call with results of AI work up/NGS testing.  #  Follow up to be decided- Dr.B  Addendum: Patient's autoimmune work-up shows-elevated anti-CCP antibodies; negative CRP; negative rheumatoid factor antibodies.  Recommend rheumatology evaluation.  Low suspicion of MDS given the absence of significant clinical findings on bone marrow biopsy.  Hold off peripheral blood NGS for MDS at this time.  Discussed with the patient's daughter in agreement.  We will make a referral to rheumatology.  Cc; Dr.K

## 2019-03-30 NOTE — Progress Notes (Signed)
[place] NOTE  Patient Care Team: [name], DO as PCP - General (Family Medicine) Cammie Sickle, MD as Consulting Physician (Internal Medicine) Lorelee Cover., MD (Ophthalmology) End, Harrell Gave, MD as Consulting Physician (Cardiology)  CHIEF COMPLAINTS/PURPOSE OF CONSULTATION:   # AUG 2016- IRON DEFICIENCY ANEMIA-  Possible AVMs of colon [s/p Argon laser; Dr.Elliot;EGD-neg Nov 2016] s/p IV Ferriheme x2 [[id number]; ALLERGY]; Venofer IV [pre-meds]; worsening anemia not responding to iron- November 2020-bone marrow biopsy-erythroid hyperplasia but no dysplasia no blasts. NOV 2020- CT A/P-negative; CXR-NEG.   # MILD LEUCOPENIA/NEUTROPENIA [ANC 1.2-1.3]-chronic ethnic neutropenia/asymptomatic  # Rectal well diff carcinoid [1.2CM; s/p polypectomy 2011]; Allergy- [facility] Phil[facility]a Course 2016]- neck/face swelling [improved with benadryl]  HISTORY OF PRESENTING ILLNESS:  Diana Harmon 73 y.o.  female is here for follow-up for her anemia-is here for follow-up/review results of the CT scan abdomen pelvis.  Patient continues to feel tired.  Chronic joint pains back pain.  Not any worse.  Anxious about her results.  Review of Systems  Constitutional: Positive for malaise/fatigue. Negative for chills, diaphoresis, fever and weight loss.  HENT: Negative for nosebleeds and sore throat.   Eyes: Negative for double vision.  Respiratory: Negative for cough, hemoptysis, sputum production, shortness of breath and wheezing.   Cardiovascular: Negative for chest pain, palpitations, orthopnea and leg swelling.  Gastrointestinal: Negative for abdominal pain, blood in stool, constipation, diarrhea, heartburn, melena, nausea and vomiting.  Genitourinary: Negative for dysuria, frequency and urgency.  Musculoskeletal: Positive for back pain and joint pain.  Skin: Negative.  Negative for itching and rash.  Neurological: Negative for dizziness, tingling, focal  weakness, weakness and headaches.  Endo/Heme/Allergies: Does not bruise/bleed easily.  Psychiatric/Behavioral: Negative for depression. The patient is not nervous/anxious and does not have insomnia.      MEDICAL HISTORY:  Past Medical History:  Diagnosis Date  . Anemia   . Aortic stenosis   . Benign tumor   . Diabetes mellitus without complication ([place])   . Hypertension   . Nausea   . Patient is [name]   . Thyroid disease     SURGICAL HISTORY: Past Surgical History:  Procedure Laterality Date  . COLONOSCOPY    . COLONOSCOPY WITH PROPOFOL N/A 03/13/2015   Procedure: COLONOSCOPY WITH PROPOFOL;  Surgeon: Manya Silvas, MD;  Location: [facility] ENDOSCOPY;  Service: Endoscopy;  Laterality: N/A;  . ESOPHAGOGASTRODUODENOSCOPY N/A 03/13/2015   Procedure: ESOPHAGOGASTRODUODENOSCOPY (EGD);  Surgeon: Manya Silvas, MD;  Location: [facility] ENDOSCOPY;  Service: Endoscopy;  Laterality: N/A;  . THYROIDECTOMY    . tumor removed      SOCIAL HISTORY: Social History   Socioeconomic History  . Marital status: Divorced    Spouse name: Not on file  . Number of children: 1  . Years of education: 45  . Highest education level: 12th grade  Occupational History  . Occupation: retired  Scientific laboratory technician  . Financial resource strain: Not hard at all  . Food insecurity    Worry: Sometimes true    Inability: Never true  . Transportation needs    Medical: No    Non-medical: No  Tobacco Use  . Smoking status: Never Smoker  . Smokeless tobacco: Never Used  Substance and Sexual Activity  . Alcohol use: Yes    Alcohol/week: 0.0 - 1.0 standard drinks    Comment: occ. wine  . Drug use: No  . Sexual activity: Not on file  Lifestyle  . Physical activity    Days per  week: 0 days    Minutes per session: 0 min  . Stress: Not at all  Relationships  . Social connections    Talks on phone: More than three times a week    Gets together: Never    Attends religious service: More than 4 times per  year    Active member of club or organization: No    Attends meetings of clubs or organizations: Never    Relationship status: Divorced  . Intimate partner violence    Fear of current or ex partner: No    Emotionally abused: No    Physically abused: No    Forced sexual activity: No  Other Topics Concern  . Not on file  Social History Narrative  . Not on file    FAMILY HISTORY: Family History  Problem Relation Age of Onset  . Parkinson's disease Mother   . Heart disease Father   . Lupus Sister   . Aneurysm Son   . Lupus Son   . Breast cancer Maternal Aunt        mat great aunt    ALLERGIES:  is allergic to ferumoxytol; other; and penicillins.  MEDICATIONS:  Current Outpatient Medications  Medication Sig Dispense Refill  . acetaminophen (TYLENOL) 500 MG tablet Take 1,000 mg by mouth every 6 (six) hours as needed for moderate pain.    Marland Kitchen aspirin EC 81 MG tablet Take 1 tablet (81 mg total) by mouth daily. 100 tablet 12  . baclofen (LIORESAL) 10 MG tablet Take 1-2 tablets (10-20 mg total) by mouth 2 (two) times daily as needed for muscle spasms. 60 each 1  . Blood Glucose Monitoring Suppl (ACCU-CHEK NANO SMARTVIEW) w/Device KIT Use to check blood glucose up to once daily as instructed 1 kit 0  . cholecalciferol (VITAMIN D) 1000 units tablet Take 2,000 Units by mouth daily.     . diclofenac sodium (VOLTAREN) 1 % GEL Apply 2 g topically 3 (three) times daily as needed. On affected knee joints 100 g 5  . Ferrous Sulfate (IRON) 325 (65 Fe) MG TABS TAKE 1 TABLET BY MOUTH TWICE DAILY WITH A MEAL 180 each 1  . glucose blood (ACCU-CHEK SMARTVIEW) test strip Check blood glucose up to once daily as instructed 100 each 5  . hydrochlorothiazide (HYDRODIURIL) 25 MG tablet Take 1 tablet by mouth once daily 90 tablet 3  . lactobacillus acidophilus (BACID) TABS tablet Take 2 tablets by mouth 2 (two) times daily.    Marland Kitchen levocetirizine (XYZAL) 5 MG tablet Take 1 tablet (5 mg total) by mouth every  evening. 90 tablet 3  . losartan (COZAAR) 50 MG tablet Take 1 tablet by mouth once daily 90 tablet 1  . simvastatin (ZOCOR) 20 MG tablet TAKE 1 TABLET BY MOUTH ONCE DAILY AT 6 PM 90 tablet 1  . Cromolyn Sodium (NASAL ALLERGY NA) Place 1 spray into the nose daily as needed (allergies).    . loratadine (CLARITIN) 10 MG tablet Take 10 mg by mouth daily.    . metFORMIN (GLUCOPHAGE) 500 MG tablet Take 1 tablet (500 mg total) by mouth daily with supper. 90 tablet 1   No current facility-administered medications for this visit.       Marland Kitchen  PHYSICAL EXAMINATION: ECOG PERFORMANCE STATUS: 1 - Symptomatic but completely ambulatory  Vitals:   03/30/19 1433  BP: 109/62  Pulse: 86  Temp: 98.1 F (36.7 C)   Filed Weights   03/30/19 1433  Weight: 214 lb (97.1 kg)  Physical Exam  Constitutional: She is oriented to person, place, and time and well-developed, well-nourished, and in no distress.  Patient is obese.  In a wheelchair.  Accompanied by daughter.  HENT:  Head: Normocephalic and atraumatic.  Mouth/Throat: Oropharynx is clear and moist. No oropharyngeal exudate.  Eyes: Pupils are equal, round, and reactive to light.  Neck: Normal range of motion. Neck supple.  Cardiovascular: Normal rate and regular rhythm.  Pulmonary/Chest: Effort normal and breath sounds normal. No respiratory distress. She has no wheezes.  Abdominal: Soft. Bowel sounds are normal. She exhibits no distension and no mass. There is no abdominal tenderness. There is no rebound and no guarding.  Musculoskeletal: Normal range of motion.        General: No tenderness or edema.  Neurological: She is alert and oriented to person, place, and time.  Skin: Skin is warm.  Psychiatric: Affect normal.    LABORATORY DATA:  I have reviewed the data as listed Lab Results  Component Value Date   WBC 2.9 (L) 03/30/2019   HGB 8.3 (L) 03/30/2019   HCT 27.8 (L) 03/30/2019   MCV 98.9 03/30/2019   PLT 230 03/30/2019   Recent  Labs    11/16/18 0816 03/25/19 0914  NA 137  --   K 4.3  --   CL 106  --   CO2 23  --   GLUCOSE 110*  --   BUN 30*  --   CREATININE 1.02* 0.90  CALCIUM 9.3  --   GFRNONAA 55*  --   GFRAA >60  --     ASSESSMENT & PLAN:  Normocytic anemia #Normocytic anemia unclear etiology; previous history of AVM iron deficiency; September 2020-iron studies negative for deficiency.  However continued anemia-worsening hemoglobin today is 8.3.  Patient [name]; declines blood transfusions.; Bone marrow biopsy-negative for any significant dyspoiesis or blasts.  Mildly hypercellular however not very concerning for MDS at this time; FISH/cytogenetics negative.  #Autoimmune work-up from today is pending; CT scan abdomen pelvis negative for any explanation for anemia.  Chest x-ray negative.                             # Mild-mod intermittent neutropenia-benign ethnic neutropenia.  Asymtomatic.  White count 2.9.  I spoke at length with the patient/daughter- regarding the patient's clinical status/plan of care.  Family agreement.   # DISPOSITION: will call with results of AI work up/NGS testing.  #  Follow up to be decided- Dr.B  Addendum: Patient's autoimmune work-up shows-elevated anti-CCP antibodies; negative CRP; negative rheumatoid factor antibodies.  Recommend rheumatology evaluation.  Low suspicion of MDS given the absence of significant clinical findings on bone marrow biopsy.  Hold off peripheral blood NGS for MDS at this time.  Discussed with the patient's daughter in agreement.  We will make a referral to rheumatology.  Cc; Dr.K         Cammie Sickle, MD 04/13/2019 8:54 AM

## 2019-04-01 LAB — RHEUMATOID ARTHRITIS PROFILE
CCP Antibodies IgG/IgA: 95 units — ABNORMAL HIGH (ref 0–19)
Rheumatoid fact SerPl-aCnc: <10 IU/mL (ref 0.0–13.9)

## 2019-04-05 ENCOUNTER — Telehealth: Payer: Self-pay | Admitting: Internal Medicine

## 2019-04-05 DIAGNOSIS — M069 Rheumatoid arthritis, unspecified: Secondary | ICD-10-CM

## 2019-04-05 DIAGNOSIS — D500 Iron deficiency anemia secondary to blood loss (chronic): Secondary | ICD-10-CM

## 2019-04-05 NOTE — Telephone Encounter (Signed)
Spoke to patient's daughter Lemmie Evens regarding results of the rheumatologic work-up-elevated anti-CCP/but with normal CRP.  Question cause of patient's ongoing anemia.  C-please make a referral to Dr. Duard Brady [facility]-regarding rheumatoid arthritis/urgent.   Please have the patient follow-up with me in 3 weeks-MD labs CBC. Dr.B  FYI-Dr.K

## 2019-04-06 ENCOUNTER — Ambulatory Visit (INDEPENDENT_AMBULATORY_CARE_PROVIDER_SITE_OTHER): Payer: Medicare Other | Admitting: Family Medicine

## 2019-04-06 ENCOUNTER — Other Ambulatory Visit: Payer: Self-pay

## 2019-04-06 ENCOUNTER — Encounter: Payer: Self-pay | Admitting: Family Medicine

## 2019-04-06 VITALS — BP 105/81 | HR 91 | Ht 62.0 in | Wt 196.0 lb

## 2019-04-06 DIAGNOSIS — E1169 Type 2 diabetes mellitus with other specified complication: Secondary | ICD-10-CM

## 2019-04-06 DIAGNOSIS — D649 Anemia, unspecified: Secondary | ICD-10-CM

## 2019-04-06 DIAGNOSIS — R768 Other specified abnormal immunological findings in serum: Secondary | ICD-10-CM | POA: Diagnosis not present

## 2019-04-06 DIAGNOSIS — D708 Other neutropenia: Secondary | ICD-10-CM

## 2019-04-06 DIAGNOSIS — E785 Hyperlipidemia, unspecified: Secondary | ICD-10-CM | POA: Diagnosis not present

## 2019-04-06 MED ORDER — METFORMIN HCL 500 MG PO TABS: 500.0000 mg | ORAL_TABLET | Freq: Every day | ORAL | 1 refills | Status: DC

## 2019-04-06 NOTE — Progress Notes (Signed)
Virtual Visit via Telephone The purpose of this virtual visit is to provide medical care while limiting exposure to the novel coronavirus (COVID19) for both patient and office staff.  Consent was obtained for phone visit:  Yes.   Answered questions that patient had about telehealth interaction:  Yes.   I discussed the limitations, risks, security and privacy concerns of performing an evaluation and management service by telephone. I also discussed with the patient that there may be a patient responsible charge related to this service. The patient expressed understanding and agreed to proceed.  Patient Location: Home Provider Location: [facility])  ---------------------------------------------------------------------- Chief Complaint  Patient presents with  . Diabetes    pt state she been followed by Onocology for Normocytic anemia unclear etiology  . Hypertension    S: Reviewed CMA documentation. I have called patient and gathered additional HPI as follows:  CHRONIC DM, Type 2 with Hyperlipidemia Last visit for DM was in 2019. She is overdue for apt. Reported A1c from [place] nurse visit 01/2019 was 6.2, abstracted. She is doing well with controlled diabetes, and asking about reducing Metformin now due to possible side effects. CBGs: Avg 100-120.Low none. High< 150.Checks CBGs few times weekly Meds: Metformin 59m x 2 tabsin AM and 1 tab in PM Reports good compliance. But thinks now side effects Currently on ARB, ASA 81, Simvastatin 277mLifestyle: Improving DM diet. Low carb - Followed by Dr PhLorie Apleynext scheduled in December 2020 Ophtho eye exam) Denies hypoglycemia  Additional history  Anemia, Normocytic [facility] BrTish Menor anemia. CT Abdominal Scan, had Bone marrow biopsy. Recent labs still show persistent reduced Hemoglobin down to 8 range.  Possible Rheumatoid Arthritis Newly identified on lab test result  from Hematology, with positive anti-CCP antibody but normal She was referred to Dr KeJefm Bryant Rheumatology awaiting apt for consultation  Denies any high risk travel to areas of current concern for COHomerDenies any known or suspected exposure to person with or possibly with COVID19.  Denies any fevers, chills, sweats, body ache, cough, shortness of breath, sinus pain or pressure, headache, abdominal pain, diarrhea  Past Medical History:  Diagnosis Date  . Anemia   . Aortic stenosis   . Benign tumor   . Diabetes mellitus without complication ([place]  . Hypertension   . Nausea   . Patient is [name]   . Thyroid disease    Social History   Tobacco Use  . Smoking status: Never Smoker  . Smokeless tobacco: Never Used  Substance Use Topics  . Alcohol use: Yes    Alcohol/week: 0.0 - 1.0 standard drinks    Comment: occ. wine  . Drug use: No    Current Outpatient Medications:  .  acetaminophen (TYLENOL) 500 MG tablet, Take 1,000 mg by mouth every 6 (six) hours as needed for moderate pain., Disp: , Rfl:  .  aspirin EC 81 MG tablet, Take 1 tablet (81 mg total) by mouth daily., Disp: 100 tablet, Rfl: 12 .  baclofen (LIORESAL) 10 MG tablet, Take 1-2 tablets (10-20 mg total) by mouth 2 (two) times daily as needed for muscle spasms., Disp: 60 each, Rfl: 1 .  Blood Glucose Monitoring Suppl (ACCU-CHEK NANO SMARTVIEW) w/Device KIT, Use to check blood glucose up to once daily as instructed, Disp: 1 kit, Rfl: 0 .  cholecalciferol (VITAMIN D) 1000 units tablet, Take 2,000 Units by mouth daily. , Disp: , Rfl:  .  Cromolyn Sodium (  NASAL ALLERGY NA), Place 1 spray into the nose daily as needed (allergies)., Disp: , Rfl:  .  diclofenac sodium (VOLTAREN) 1 % GEL, Apply 2 g topically 3 (three) times daily as needed. On affected knee joints, Disp: 100 g, Rfl: 5 .  Ferrous Sulfate (IRON) 325 (65 Fe) MG TABS, TAKE 1 TABLET BY MOUTH TWICE DAILY WITH A MEAL, Disp: 180 each, Rfl: 1 .  glucose  blood (ACCU-CHEK SMARTVIEW) test strip, Check blood glucose up to once daily as instructed, Disp: 100 each, Rfl: 5 .  hydrochlorothiazide (HYDRODIURIL) 25 MG tablet, Take 1 tablet by mouth once daily, Disp: 90 tablet, Rfl: 3 .  lactobacillus acidophilus (BACID) TABS tablet, Take 2 tablets by mouth 2 (two) times daily., Disp: , Rfl:  .  levocetirizine (XYZAL) 5 MG tablet, Take 1 tablet (5 mg total) by mouth every evening., Disp: 90 tablet, Rfl: 3 .  losartan (COZAAR) 50 MG tablet, Take 1 tablet by mouth once daily, Disp: 90 tablet, Rfl: 1 .  metFORMIN (GLUCOPHAGE) 500 MG tablet, Take 1 tablet (500 mg total) by mouth daily with supper., Disp: 90 tablet, Rfl: 1 .  simvastatin (ZOCOR) 20 MG tablet, TAKE 1 TABLET BY MOUTH ONCE DAILY AT 6 PM, Disp: 90 tablet, Rfl: 1 .  loratadine (CLARITIN) 10 MG tablet, Take 10 mg by mouth daily., Disp: , Rfl:   Depression screen [facility] 2/9 04/14/2018 01/27/2018 12/25/2017  Decreased Interest 0 0 0  Down, Depressed, Hopeless 0 0 0  PHQ - 2 Score 0 0 0    No flowsheet data found.  -------------------------------------------------------------------------- O: No physical exam performed due to remote telephone encounter.  Filed Weights   04/06/19 1042  Weight: 196 lb (88.9 kg)    Lab results reviewed.  I have personally reviewed the radiology report from 03/25/19   CLINICAL DATA:  Weight loss and neutropenia. Anemia. History of well differentiated carcinoid in the rectum, polypectomy in 2011.  EXAM: CT ABDOMEN AND PELVIS WITH CONTRAST  TECHNIQUE: Multidetector CT imaging of the abdomen and pelvis was performed using the standard protocol following bolus administration of intravenous contrast.  CONTRAST:  152m OMNIPAQUE IOHEXOL 300 MG/ML  SOLN  COMPARISON:  01/22/2007 CT scan  FINDINGS: Lower chest: Calcifications along the aortic valve and aortic root.  Small amount of contrast in the distal esophagus, potentially from dysmotility or reflux.  Lymph node above the diaphragm near the IVC on image 13/2, measuring 0.7 cm in short axis.  Hepatobiliary: Unremarkable  Pancreas: Unremarkable  Spleen: Unremarkable  Adrenals/Urinary Tract: Unremarkable  Stomach/Bowel: Scattered colonic diverticula. These are more concentrated in the sigmoid colon. No active diverticulitis. No well-defined mass is identified.  Vascular/Lymphatic: Aortoiliac atherosclerotic vascular disease. There is a small amount of left para-aortic portosystemic shunting.  Reproductive: Unremarkable  Other: No supplemental non-categorized findings.  Musculoskeletal: Grade 1 degenerative anterolisthesis at L3-4 and L4-5. In conjunction with spondylosis and degenerative disc disease there is thought to be bilateral foraminal impingement at all levels between L2 and S1.  IMPRESSION: 1. A cause for the patient's weight loss and a trip E knee a is not identified. 2. There is a small amount of contrast in the distal esophagus, potentially from dysmotility or reflux. 3. Other imaging findings of potential clinical significance: Calcifications along the aortic valve and aortic root. Small amount of left para-aortic portosystemic shunting. Scattered colonic diverticula. Lumbar spondylosis and degenerative disc disease causing impingement at all levels between L2 and S1.  Aortic Atherosclerosis (ICD10-I70.0).   Electronically Signed  By: Van Clines M.D.   On: 03/25/2019 10:35   Recent Results (from the past 2160 hour(s))  Hemoglobin A1c     Status: Abnormal   Collection Time: 01/19/19 12:00 AM  Result Value Ref Range   Hemoglobin A1C 6.2 (A) 4.0 - 5.6 %    Comment: [facility] Visit  Iron and TIBC     Status: None   Collection Time: 02/15/19  8:09 AM  Result Value Ref Range   Iron 55 28 - 170 ug/dL   TIBC 345 250 - 450 ug/dL   Saturation Ratios 16 10.4 - 31.8 %   UIBC 290 ug/dL    Comment: Performed at [facility], [address] [place], [place] [address]  Ferritin     Status: None   Collection Time: 02/15/19  8:09 AM  Result Value Ref Range   Ferritin 87 11 - 307 ng/mL    Comment: Performed at [facility], [address] [place], [place] [address]  CBC with Differential     Status: Abnormal   Collection Time: 02/15/19  8:09 AM  Result Value Ref Range   WBC 3.9 (L) 4.0 - 10.5 K/uL   RBC 3.36 (L) 3.87 - 5.11 MIL/uL   Hemoglobin 9.7 (L) 12.0 - 15.0 g/dL   HCT 31.7 (L) 36.0 - 46.0 %   MCV 94.3 80.0 - 100.0 fL   MCH 28.9 26.0 - 34.0 pg   MCHC 30.6 30.0 - 36.0 g/dL   RDW 13.9 11.5 - 15.5 %   Platelets 238 150 - 400 K/uL   nRBC 0.0 0.0 - 0.2 %   Neutrophils Relative % 38 %   Neutro Abs 1.5 (L) 1.7 - 7.7 K/uL   Lymphocytes Relative 45 %   Lymphs Abs 1.8 0.7 - 4.0 K/uL   Monocytes Relative 12 %   Monocytes Absolute 0.5 0.1 - 1.0 K/uL   Eosinophils Relative 4 %   Eosinophils Absolute 0.2 0.0 - 0.5 K/uL   Basophils Relative 1 %   Basophils Absolute 0.0 0.0 - 0.1 K/uL   Immature Granulocytes 0 %   Abs Immature Granulocytes 0.01 0.00 - 0.07 K/uL    Comment: Performed at [facility], [address] [place], [place] [address]  Surgical pathology     Status: None   Collection Time: 03/10/19 12:00 AM  Result Value Ref Range   SURGICAL PATHOLOGY      Surgical Pathology CASE: [id number] PATIENT: Dwan Knotek Bone Marrow Report     Clinical History: mild chronic anemia and leukopenia, possible MDS, right iliac bone (ADC)     DIAGNOSIS:  BONE MARROW, ASPIRATE, CLOT, CORE: - Hypercellular bone marrow for age with erythroid hyperplasia - Slight lymphocytosis and plasmacytosis. - See comment  PERIPHERAL BLOOD: - Normocytic-normochromic anemia - Leukopenia  COMMENT:  The bone marrow is hypercellular for age with trilineage hematopoiesis but with erythroid hyperplasia.  Significant dyspoiesis or increase in blastic cells is not identified.  There  is slight lymphocytosis (22%) in the aspirate associated with several primarily small lymphoid aggregates in the clot and biopsy sections of primarily small lymphocytes.  Flow cytometric analysis failed to show any monoclonal B-cell population or abnormal T-cell phenotype.  A small population (5%) of precursors B-cells is present consistent with hematogones.    Additionally, a small population (4%) of polyclonal plasma cells are seen.  The overall findings are not necessarily specific but likely reactive in nature possibly due to infection, autoimmune disease, bone  marrow recovery/regeneration, etc.  Correlation with cytogenetic studies is recommended.  MICROSCOPIC DESCRIPTION:  PERIPHERAL BLOOD SMEAR: The red blood cells display mild anisopoikilocytosis with mild polychromasia.  The white blood cells are slightly decreased in number with neutropenia and relative abundance of primarily small lymphocytes although occasional larger lymphoid cells are seen.  The platelets are normal in number.  BONE MARROW ASPIRATE: Bone marrow particles present Erythroid precursors: Relatively abundant with orderly and progressive maturation Granulocytic precursors: Orderly and progressive maturation. Megakaryocytes: Abundant with predominantly normal morphology Lymphocytes/plasma cells: There is slight increase in lymphocytes (22%) mostl y consisting of small lymphoid cells with high nuclear cytoplasmic ratio, round to slightly irregular nuclei, dense chromatin and inconspicuous nucleoli.  This is associated with slight increase in plasma cells (4%) with lack of large aggregates of sheets.  TOUCH PREPARATIONS: A mixture of cell types present  CLOT AND BIOPSY: The sections show 40 to 60% cellularity with a mixture of myeloid cell types. Several predominantly small interstitial and well-circumscribed lymphoid aggregates mostly composed of small lymphocytes are seen.  Large aggregates or sheets of  plasma cells are not identified.  No granulomata are seen.  Immunohistochemical stain for CD138 and in situ hybridization for kappa and lambda were performed on blocks B1 and C1 with appropriate controls.  CD138 highlights the relatively minor plasma cells component consisting of interstitial cells and small clusters and display a polyclonal staining pattern for kappa and lambda light chains.  IRON STAIN: Iron  stains are performed on a bone marrow aspirate or touch imprint smear and section of clot. The controls stained appropriately.       Storage Iron: Present      Ring Sideroblasts: Absent  ADDITIONAL DATA/TESTING: The specimen was sent for cytogenetic analysis and a separate report will follow. Flow cytometric analysis was performed ([id number]) and failed to show any monoclonal B-cell population or abnormal T-cell phenotype.  There is a small population of precursor B cells (5%) expressing CD19, CD38 and CD10 with lack of surface kappa and lambda light chains consistent with hematogones.   CELL COUNT DATA:  Bone Marrow count performed on 500 cells shows: Blasts:   2%   Myeloid:  29% Promyelocytes: 0%   Erythroid:     42% Myelocytes:    5%   Lymphocytes:   22% Metamyelocytes:     0%   Plasma cells:  4% Bands:    10% Neutrophils:   8%   M:E ratio:     0.69 Eosinophils:   4% Basophils:     0% Monocytes:     3%  Lab Data: CBC performed on 03/10/2019 shows: WBC:  3.3 k/uL  Neutrophils:   37% Hgb: 9.2 g/dL  Lymphocytes:   50% HCT: 29.5 %    Monocytes:     9% MCV: 94.6 fL   Eosinophils:   3% RDW: 14.1 %    Basophils:     1% PLT: 252 k/uL    GROSS DESCRIPTION:  A.  Peripheral blood smear.  B.  Received in B-plus fixative is a 0.5 x 0.3 x 0.1 cm aggregate of tan soft tissue, submitted in block B1.  C.  Received in B-plus fixative are 2 cores of tan-brown hard soft tissue ranging from 1.2 to 1.4 cm, submitted in block C1 following decalcification.  (AK  03/10/2019)    Final Diagnosis performed by Susanne Greenhouse, MD.   Electronically signed 03/12/2019 Technical and / or Professional components performed at [facility], [place] [address]  [address] [place], [place] [address].  Immunohistochemistry Technical component (if applicable) was performed at [facility]. [address], [place], [place], [place] [address].   IMMUNOHISTOCHEMISTRY DISCLAIMER (if applicable): Some of these immunohistochemical stain s may have been developed and the performance characteristics determine by [facility]. Some may not have been cleared or approved by the U.S. Food and Drug Administration. The FDA has determined that such clearance or approval is not necessary. This test is used for clinical purposes. It should not be regarded as investigational or for research. This laboratory is certified under the [place] (CLIA-88) as qualified to perform high complexity clinical laboratory testing.  The controls stained appropriately.   Surgical pathology     Status: None   Collection Time: 03/10/19 12:00 AM  Result Value Ref Range   SURGICAL PATHOLOGY      Surgical Pathology CASE: [id number] PATIENT: Brittnie Roll Flow Pathology Report     Clinical history: Mild chronic anemia, leukopenia     DIAGNOSIS:  -No monoclonal B-cell population or abnormal T-cell phenotype identified -A small population of precursor B cells (5%) -See comment  COMMENT:  Analysis of the lymphoid population (bright staining for CD45) failed to show any monoclonal B-cell population or abnormal T-cell phenotype. Analysis of CD45 dim cells, however, shows a small population of precursor B cells (5%) expressing CD19, CD38 and CD10 with lack of surface kappa and lambda consistent with hematogones.   GATING AND PHENOTYPIC ANALYSIS:  Gated population: Flow cytometric immunophenotyping is  performed using antibodies to the antigens listed in the table below. Electronic gates are placed around a cell cluster displaying light scatter properties corresponding to: lymphocytes  Abnormal Cells in gated population: See comment.  Phenotype o f Abnormal Cells:  See comment.                        Lymphoid Antigens       Myeloid Antigens Miscellaneous CD2  tested    CD10 tested    CD11b     ND   CD45 tested CD3  tested    CD19 tested    CD11c     ND   HLA-Dr    ND CD4  tested    CD20 tested    CD13 ND   CD34 tested CD5  tested    CD22 ND   CD14 ND   CD38 tested CD7  tested    CD79b     ND   CD15 ND   CD138     ND CD8  tested    CD103     ND   CD16 ND   TdT  ND CD25 ND   CD200     tested    CD33 ND   CD123     ND TCRab     ND   sKappa    tested    CD64 ND   CD41 ND TCRgd     tested    sLambda   tested    CD117     ND   CD61 ND CD56 tested    cKappa    ND   MPO  ND   CD71 ND CD57 ND   cLambda   ND        [id number]      GROSS DESCRIPTION:  Reference Bone Marrow case [id number].    Final Diagnosis performed by Susanne Greenhouse, MD.   Electronically signed 03/12/2019 Technical and /  or Professional components performed at [facility], [place] [address], [place], [place] [address].   Protime-INR upon arrival     Status: None   Collection Time: 03/10/19  7:56 AM  Result Value Ref Range   Prothrombin Time 13.3 11.4 - 15.2 seconds   INR 1.0 0.8 - 1.2    Comment: (NOTE) INR goal varies based on device and disease states. Performed at [facility], [address] [place], [place] [address]   CBC with Differential/Platelet     Status: Abnormal   Collection Time: 03/10/19  7:56 AM  Result Value Ref Range   WBC 3.3 (L) 4.0 - 10.5 K/uL   RBC 3.12 (L) 3.87 - 5.11 MIL/uL   Hemoglobin 9.2 (L) 12.0 - 15.0 g/dL   HCT 29.5 (L) 36.0 - 46.0 %   MCV 94.6 80.0 - 100.0 fL   MCH 29.5 26.0 - 34.0 pg   MCHC 31.2 30.0 - 36.0 g/dL   RDW 14.1 11.5 - 15.5 %    Platelets 252 150 - 400 K/uL   nRBC 0.0 0.0 - 0.2 %   Neutrophils Relative % 38 %   Neutro Abs 1.3 (L) 1.7 - 7.7 K/uL   Lymphocytes Relative 46 %   Lymphs Abs 1.6 0.7 - 4.0 K/uL   Monocytes Relative 11 %   Monocytes Absolute 0.4 0.1 - 1.0 K/uL   Eosinophils Relative 4 %   Eosinophils Absolute 0.1 0.0 - 0.5 K/uL   Basophils Relative 1 %   Basophils Absolute 0.0 0.0 - 0.1 K/uL   Immature Granulocytes 0 %   Abs Immature Granulocytes 0.01 0.00 - 0.07 K/uL    Comment: Performed at [facility], [address] [place], [place] [address]  Glucose, capillary     Status: None   Collection Time: 03/10/19  8:02 AM  Result Value Ref Range   Glucose-Capillary 95 70 - 99 mg/dL  Glucose, capillary     Status: Abnormal   Collection Time: 03/10/19 10:15 AM  Result Value Ref Range   Glucose-Capillary 110 (H) 70 - 99 mg/dL  CBC with Differential/Platelet     Status: Abnormal   Collection Time: 03/17/19 10:07 AM  Result Value Ref Range   WBC 3.2 (L) 4.0 - 10.5 K/uL   RBC 2.93 (L) 3.87 - 5.11 MIL/uL   Hemoglobin 8.7 (L) 12.0 - 15.0 g/dL   HCT 28.5 (L) 36.0 - 46.0 %   MCV 97.3 80.0 - 100.0 fL   MCH 29.7 26.0 - 34.0 pg   MCHC 30.5 30.0 - 36.0 g/dL   RDW 14.2 11.5 - 15.5 %   Platelets 228 150 - 400 K/uL   nRBC 0.0 0.0 - 0.2 %   Neutrophils Relative % 37 %   Neutro Abs 1.2 (L) 1.7 - 7.7 K/uL   Lymphocytes Relative 45 %   Lymphs Abs 1.4 0.7 - 4.0 K/uL   Monocytes Relative 13 %   Monocytes Absolute 0.4 0.1 - 1.0 K/uL   Eosinophils Relative 4 %   Eosinophils Absolute 0.1 0.0 - 0.5 K/uL   Basophils Relative 1 %   Basophils Absolute 0.0 0.0 - 0.1 K/uL   Immature Granulocytes 0 %   Abs Immature Granulocytes 0.01 0.00 - 0.07 K/uL    Comment: Performed at [facility] [address] [place],  [address]  I-STAT creatinine     Status: None   Collection Time: 03/25/19  9:14 AM  Result Value Ref Range   Creatinine,  Ser 0.90 0.44 - 1.00 mg/dL  Rheumatoid Arthritis Profile      Status: Abnormal   Collection Time: 03/30/19  2:16 PM  Result Value Ref Range   Rhuematoid fact SerPl-aCnc <10.0 0.0 - 13.9 IU/mL   CCP Antibodies IgG/IgA 95 (H) 0 - 19 units    Comment: (NOTE)                          Negative               <20                          Weak positive      20 - 39                          Moderate positive  40 - 59                          Strong positive        >59 Performed At: [facility] [place], [place] [id number] Rush Farmer MD [id number]   C-reactive protein     Status: None   Collection Time: 03/30/19  2:16 PM  Result Value Ref Range   CRP <0.8 <1.0 mg/dL    Comment: Performed at [facility], [place] [address] [place], [place] [address]  CBC with Differential     Status: Abnormal   Collection Time: 03/30/19  2:16 PM  Result Value Ref Range   WBC 2.9 (L) 4.0 - 10.5 K/uL   RBC 2.81 (L) 3.87 - 5.11 MIL/uL   Hemoglobin 8.3 (L) 12.0 - 15.0 g/dL   HCT 27.8 (L) 36.0 - 46.0 %   MCV 98.9 80.0 - 100.0 fL   MCH 29.5 26.0 - 34.0 pg   MCHC 29.9 (L) 30.0 - 36.0 g/dL   RDW 14.5 11.5 - 15.5 %   Platelets 230 150 - 400 K/uL   nRBC 0.0 0.0 - 0.2 %   Neutrophils Relative % 48 %   Neutro Abs 1.4 (L) 1.7 - 7.7 K/uL   Lymphocytes Relative 39 %   Lymphs Abs 1.1 0.7 - 4.0 K/uL   Monocytes Relative 9 %   Monocytes Absolute 0.3 0.1 - 1.0 K/uL   Eosinophils Relative 3 %   Eosinophils Absolute 0.1 0.0 - 0.5 K/uL   Basophils Relative 1 %   Basophils Absolute 0.0 0.0 - 0.1 K/uL   Immature Granulocytes 0 %   Abs Immature Granulocytes 0.00 0.00 - 0.07 K/uL    Comment: Performed at [facility], [address] [place], [place] [address]    -------------------------------------------------------------------------- A&P:  Problem List Items Addressed This Visit    Type 2 diabetes mellitus with other specified complication ([place]) - Primary    Stable controlled T2DM, Last A1c reported from nurse home visit at 6.2. Goal  is < 8 No hyperglycemia or hypoglycemia Complications - other including hyperlipidemia, GERD, obesity - increases risk of future cardiovascular complications   Plan:  Possible side effect now on metformin - reduce dose - Reduce metformin to 547m IR nightly x 1- stop AM dosing. - consider future switch to XR dosing 5030mdaily - Reconsider GLP1 injection options in future if elevated sugar or concern with wt not improving - previously given handout before 2. Encourage improved lifestyle -  low carb, low sugar diet, reduce portion size, continue improving regular exercise 3. Check CBG, bring log to next visit for review 4. Continue ASA, ARB, Statin 5. Follow-up 6 month DM A1c sooner if needed      Relevant Medications   metFORMIN (GLUCOPHAGE) 500 MG tablet   Normocytic anemia   Neutropenia (HCC)   Hyperlipidemia associated with type 2 diabetes mellitus ([place])    Previously controlled cholesterol on statin Last lipid panel 2019  Plan: 1. Continue current meds - Simvastatin 68m daily 2. Encourage improved lifestyle - low carb/cholesterol, reduce portion size, continue improving activity as tolerated  WIll be due for repeat lipids next available, limited now due to CGreenvilledid not come in for blood      Relevant Medications   metFORMIN (GLUCOPHAGE) 500 MG tablet    Other Visit Diagnoses    Positive anti-CCP test         #Neutropenia / [facility] BRogue Bussing- Neutropenia thought to be benign ethnic cause - Anemia is uncertain awaiting further work up, had negative bone marrow biopsy and imaging  #Elevated Anti CCP [facility]ing [facility]  Meds ordered this encounter  Medications  . metFORMIN (GLUCOPHAGE) 500 MG tablet    Sig: Take 1 tablet (500 mg total) by mouth daily with supper.    Dispense:  90 tablet    Refill:  1    Follow-up: - Return in 6 months for DM A1c  Patient verbalizes  understanding with the above medical recommendations including the limitation of remote medical advice.  Specific follow-up and call-back criteria were given for patient to follow-up or seek medical care more urgently if needed.   - Time spent in direct consultation with patient on phone: 12 minutes   ANobie Putnam [facility] 04/06/2019, 10:48 AM

## 2019-04-06 NOTE — Addendum Note (Signed)
Addended by: Sabino Gasser on: 04/06/2019 10:06 AM   Modules accepted: Orders

## 2019-04-06 NOTE — Assessment & Plan Note (Signed)
Stable controlled T2DM, Last A1c reported from nurse home visit at 6.2. Goal is < 8 No hyperglycemia or hypoglycemia Complications - other including hyperlipidemia, GERD, obesity - increases risk of future cardiovascular complications   Plan:  Possible side effect now on metformin - reduce dose - Reduce metformin to 500mg  IR nightly x 1- stop AM dosing. - consider future switch to XR dosing 500mg  daily - Reconsider GLP1 injection options in future if elevated sugar or concern with wt not improving - previously given handout before 2. Encourage improved lifestyle - low carb, low sugar diet, reduce portion size, continue improving regular exercise 3. Check CBG, bring log to next visit for review 4. Continue ASA, ARB, Statin 5. Follow-up 6 month DM A1c sooner if needed

## 2019-04-06 NOTE — Patient Instructions (Addendum)
  Please schedule a Follow-up Appointment to: Return in about 6 months (around 10/05/2019) for 6 month DM A1c.  If you have any other questions or concerns, please feel free to call the office or send a message through [facility]. You may also schedule an earlier appointment if necessary.  Additionally, you may be receiving a survey about your experience at our office within a few days to 1 week by e-mail or mail. We value your feedback.  [name], DO [place]

## 2019-04-06 NOTE — Assessment & Plan Note (Signed)
Previously controlled cholesterol on statin Last lipid panel 2019  Plan: 1. Continue current meds - Simvastatin 20mg  daily 2. Encourage improved lifestyle - low carb/cholesterol, reduce portion size, continue improving activity as tolerated  WIll be due for repeat lipids next available, limited now due to [place] did not come in for blood

## 2019-04-18 LAB — HM DIABETES EYE EXAM

## 2019-04-19 DIAGNOSIS — E113393 Type 2 diabetes mellitus with moderate nonproliferative diabetic retinopathy without macular edema, bilateral: Secondary | ICD-10-CM | POA: Diagnosis not present

## 2019-04-20 ENCOUNTER — Ambulatory Visit (INDEPENDENT_AMBULATORY_CARE_PROVIDER_SITE_OTHER): Payer: Medicare Other

## 2019-04-20 VITALS — BP 138/62 | HR 74 | Ht 62.0 in | Wt 213.0 lb

## 2019-04-20 DIAGNOSIS — Z0000 Encounter for general adult medical examination without abnormal findings: Secondary | ICD-10-CM

## 2019-04-20 NOTE — Progress Notes (Signed)
Subjective:   Diana Harmon is a 73 y.o. female who presents for Medicare Annual (Subsequent) preventive examination.  This visit is being conducted via phone call  - after an attmept to do on video chat - due to the COVID-19 pandemic. This patient has given me verbal consent via phone to conduct this visit, patient states they are participating from their home address. Some vital signs may be absent or patient reported.   Patient identification: identified by name, DOB, and current address.    Review of Systems:  Cardiac Risk Factors include: advanced age (>66mn, >>57women);dyslipidemia;diabetes mellitus;hypertension;obesity (BMI >30kg/m2)     Objective:     Vitals: BP 138/62   Pulse 74   Ht '5\' 2"'  (1.575 m)   Wt 213 lb (96.6 kg)   BMI 38.96 kg/m   Body mass index is 38.96 kg/m.  Advanced Directives 04/20/2019 03/16/2019 03/10/2019 02/16/2019 04/14/2018 02/17/2018 08/19/2017  Does Patient Have a Medical Advance Directive? No No No No Yes No No  Type of Advance Directive - - - - HPress photographerLiving will - -  Does patient want to make changes to medical advance directive? - No - Patient declined No - Patient declined No - Patient declined - - -  Copy of HOntarioin Chart? - - - - No - copy requested - -  Would patient like information on creating a medical advance directive? - No - Patient declined Yes (MAU/Ambulatory/Procedural Areas - Information given) No - Patient declined - No - Patient declined Yes (MAU/Ambulatory/Procedural Areas - Information given)    Tobacco Social History   Tobacco Use  Smoking Status Never Smoker  Smokeless Tobacco Never Used     Counseling given: Not Answered   Clinical Intake:  Pre-visit preparation completed: Yes  Pain : 0-10 Pain Score: 8  Pain Type: Chronic pain Pain Location: Generalized Pain Descriptors / Indicators: Aching Pain Onset: More than a month ago Pain Frequency:  Constant     Nutritional Status: BMI > 30  Obese Nutritional Risks: None Diabetes: Yes CBG done?: No Did pt. bring in CBG monitor from home?: No  How often do you need to have someone help you when you read instructions, pamphlets, or other written materials from your doctor or pharmacy?: 1 - Never  Nutrition Risk Assessment:  Has the patient had any N/V/D within the last 2 months?  No  Does the patient have any non-healing wounds?  No  Has the patient had any unintentional weight loss or weight gain?  No   Diabetes:  Is the patient diabetic?  Yes  If diabetic, was a CBG obtained today?  No  Did the patient bring in their glucometer from home?  No  How often do you monitor your CBG's? Twice a week   Financial Strains and Diabetes Management:  Are you having any financial strains with the device, your supplies or your medication? No .  Does the patient want to be seen by Chronic Care Management for management of their diabetes?  No  Would the patient like to be referred to a Nutritionist or for Diabetic Management?  No   Diabetic Exams:  Diabetic Eye Exam: Completed 04/20/2019 with Dr.Bell   Diabetic Foot Exam: Pt has been advised about the importance in completing this exam.    Interpreter Needed?: No  Information entered by :: Menucha Dicesare,LPN  Past Medical History:  Diagnosis Date  . Anemia   . Aortic stenosis   .  Benign tumor   . Diabetes mellitus without complication ([place])   . Hypertension   . Nausea   . Patient is [name]   . Thyroid disease    Past Surgical History:  Procedure Laterality Date  . COLONOSCOPY    . COLONOSCOPY WITH PROPOFOL N/A 03/13/2015   Procedure: COLONOSCOPY WITH PROPOFOL;  Surgeon: Manya Silvas, MD;  Location: [facility] ENDOSCOPY;  Service: Endoscopy;  Laterality: N/A;  . ESOPHAGOGASTRODUODENOSCOPY N/A 03/13/2015   Procedure: ESOPHAGOGASTRODUODENOSCOPY (EGD);  Surgeon: Manya Silvas, MD;  Location[facility] ENDOSCOPY;   Service: Endoscopy;  Laterality: N/A;  . THYROIDECTOMY    . tumor removed     Family History  Problem Relation Age of Onset  . Parkinson's disease Mother   . Heart disease Father   . Lupus Sister   . Aneurysm Son   . Lupus Son   . Breast cancer Maternal Aunt        mat great aunt   Social History   Socioeconomic History  . Marital status: Divorced    Spouse name: Not on file  . Number of children: 1  . Years of education: 40  . Highest education level: 12th grade  Occupational History  . Occupation: retired  Tobacco Use  . Smoking status: Never Smoker  . Smokeless tobacco: Never Used  Substance and Sexual Activity  . Alcohol use: Yes    Alcohol/week: 0.0 - 1.0 standard drinks    Comment: occ. wine  . Drug use: No  . Sexual activity: Not on file  Other Topics Concern  . Not on file  Social History Narrative  . Not on file   Social Determinants of Health   Financial Resource Strain:   . Difficulty of Paying Living Expenses: Not on file  Food Insecurity:   . Worried About Charity fundraiser in the Last Year: Not on file  . Ran Out of Food in the Last Year: Not on file  Transportation Needs: No Transportation Needs  . Lack of Transportation (Medical): No  . Lack of Transportation (Non-Medical): No  Physical Activity: Inactive  . Days of Exercise per Week: 0 days  . Minutes of Exercise per Session: 0 min  Stress:   . Feeling of Stress : Not on file  Social Connections:   . Frequency of Communication with Friends and Family: Not on file  . Frequency of Social Gatherings with Friends and Family: Not on file  . Attends Religious Services: Not on file  . Active Member of Clubs or Organizations: Not on file  . Attends Archivist Meetings: Not on file  . Marital Status: Not on file    Outpatient Encounter Medications as of 04/20/2019  Medication Sig  . acetaminophen (TYLENOL) 500 MG tablet Take 1,000 mg by mouth every 6 (six) hours as needed for  moderate pain.  Marland Kitchen aspirin EC 81 MG tablet Take 1 tablet (81 mg total) by mouth daily.  . baclofen (LIORESAL) 10 MG tablet Take 1-2 tablets (10-20 mg total) by mouth 2 (two) times daily as needed for muscle spasms.  . Blood Glucose Monitoring Suppl (ACCU-CHEK NANO SMARTVIEW) w/Device KIT Use to check blood glucose up to once daily as instructed  . cholecalciferol (VITAMIN D) 1000 units tablet Take 2,000 Units by mouth daily.   . Cromolyn Sodium (NASAL ALLERGY NA) Place 1 spray into the nose daily as needed (allergies).  . diclofenac sodium (VOLTAREN) 1 % GEL Apply 2 g topically 3 (three) times daily as  needed. On affected knee joints  . Ferrous Sulfate (IRON) 325 (65 Fe) MG TABS TAKE 1 TABLET BY MOUTH TWICE DAILY WITH A MEAL  . glucose blood (ACCU-CHEK SMARTVIEW) test strip Check blood glucose up to once daily as instructed  . hydrochlorothiazide (HYDRODIURIL) 25 MG tablet Take 1 tablet by mouth once daily  . lactobacillus acidophilus (BACID) TABS tablet Take 2 tablets by mouth 2 (two) times daily. Once in the am as needed  . levocetirizine (XYZAL) 5 MG tablet Take 1 tablet (5 mg total) by mouth every evening.  Marland Kitchen losartan (COZAAR) 50 MG tablet Take 1 tablet by mouth once daily  . metFORMIN (GLUCOPHAGE) 500 MG tablet Take 1 tablet (500 mg total) by mouth daily with supper.  . simvastatin (ZOCOR) 20 MG tablet TAKE 1 TABLET BY MOUTH ONCE DAILY AT 6 PM  . [DISCONTINUED] loratadine (CLARITIN) 10 MG tablet Take 10 mg by mouth daily.   No facility-administered encounter medications on file as of 04/20/2019.    Activities of Daily Living In your present state of health, do you have any difficulty performing the following activities: 04/20/2019 03/10/2019  Hearing? Y N  Comment no hearing aids -  Vision? N N  Comment eyeglasses, dr.bell -  Difficulty concentrating or making decisions? N N  Walking or climbing stairs? Y Y  Comment pain -  Dressing or bathing? N N  Doing errands, shopping? N -   Preparing Food and eating ? N -  Using the Toilet? N -  In the past six months, have you accidently leaked urine? N -  Do you have problems with loss of bowel control? N -  Managing your Medications? N -  Managing your Finances? N -  Housekeeping or managing your Housekeeping? N -  Some recent data might be hidden    Patient Care Team: [name], DO as PCP - General (Family Medicine) Cammie Sickle, MD as Consulting Physician (Internal Medicine) Lorelee Cover., MD (Ophthalmology) End, Harrell Gave, MD as Consulting Physician (Cardiology)    Assessment:   This is a routine wellness examination for [place].  Exercise Activities and Dietary recommendations Current Exercise Habits: The patient does not participate in regular exercise at present, Exercise limited by: None identified  Goals    . DIET - INCREASE WATER INTAKE     Recommend drinking at least 6-8 glasses of water a day     . DIET - REDUCE SUGAR INTAKE     Recommend to cut out desserts in daily diet to help aid in weight loss and control diabetes.        Fall Risk: Fall Risk  04/20/2019 04/14/2018 01/27/2018 12/25/2017 04/08/2017  Falls in the past year? 0 1 No No No  Number falls in past yr: 0 0 - - -  Injury with Fall? 0 - - - -  Risk for fall due to : - Other (Comment) - - -  Risk for fall due to: Comment - arthritis in knees and menieres disease - - -  Follow up - Falls prevention discussed - - -    FALL RISK PREVENTION PERTAINING TO THE HOME:  Any stairs in or around the home? No  ramp  If so, are there any without handrails? No   Home free of loose throw rugs in walkways, pet beds, electrical cords, etc? Yes  Adequate lighting in your home to reduce risk of falls? Yes   ASSISTIVE DEVICES UTILIZED TO PREVENT FALLS:  Life alert? No  Use of a cane, walker or w/c? Yes  Grab bars in the bathroom? No  Shower chair or bench in shower? Yes  Elevated toilet seat or a handicapped toilet?  Yes   TIMED UP AND GO:  Unable to perform    Depression Screen PHQ 2/9 Scores 04/20/2019 04/14/2018 01/27/2018 12/25/2017  PHQ - 2 Score 0 0 0 0     Cognitive Function     6CIT Screen 04/14/2018 04/08/2017  What Year? 0 points 0 points  What month? 0 points 0 points  What time? 0 points 0 points  Count back from 20 0 points 0 points  Months in reverse 0 points 0 points  Repeat phrase 6 points 0 points  Total Score 6 0    Immunization History  Administered Date(s) Administered  . Fluad Quad(high Dose 65+) 01/12/2019  . Influenza, High Dose Seasonal PF 02/09/2014, 01/05/2015, 01/30/2016, 01/14/2017  . Influenza,inj,Quad PF,6+ Mos 04/05/2013  . Influenza-Unspecified 02/03/2014  . Pneumococcal Conjugate-13 08/05/2014  . Pneumococcal Polysaccharide-23 04/05/2013    Qualifies for Shingles Vaccine? Yes  Zostavax completed n/a. Due for Shingrix. Education has been provided regarding the importance of this vaccine. Pt has been advised to call insurance company to determine out of pocket expense. Advised may also receive vaccine at local pharmacy or Health Dept. Verbalized acceptance and understanding.  Tdap: up to date   Flu Vaccine: up to date   Pneumococcal Vaccine: up to date   Screening Tests Health Maintenance  Topic Date Due  . OPHTHALMOLOGY EXAM  06/18/2018  . FOOT EXAM  01/28/2019  . HEMOGLOBIN A1C  07/19/2019  . MAMMOGRAM  04/01/2020  . DEXA SCAN  05/18/2024  . TETANUS/TDAP  12/04/2024  . COLONOSCOPY  03/12/2025  . INFLUENZA VACCINE  Completed  . Hepatitis C Screening  Completed  . PNA vac Low Risk Adult  Completed    Cancer Screenings:  Colorectal Screening: Completed 03/2015 Repeat every 10 years   Mammogram: Completed 04/01/2018.   Bone Density: Completed 2016.   Lung Cancer Screening: (Low Dose CT Chest recommended if Age 81-80 years, 30 pack-year currently smoking OR have quit w/in 15years.) does not qualify.    Additional Screening:  Hepatitis  C Screening: does qualify; Completed 2019  Dental Screening: Recommended annual dental exams for proper oral hygiene.   Community Resource Referral:  CRR required this visit?  No       Plan:  I have personally reviewed and addressed the Medicare Annual Wellness questionnaire and have noted the following in the patient's chart:  A. Medical and social history B. Use of alcohol, tobacco or illicit drugs  C. Current medications and supplements D. Functional ability and status E.  Nutritional status F.  Physical activity G. Advance directives H. List of other physicians I.  Hospitalizations, surgeries, and ER visits in previous 12 months J.  [place] such as hearing and vision if needed, cognitive and depression L. Referrals and appointments   In addition, I have reviewed and discussed with patient certain preventive protocols, quality metrics, and best practice recommendations. A written personalized care plan for preventive services as well as general preventive health recommendations were provided to patient.   Signed,    Bevelyn Ngo, LPN  [id number] Nurse Health Advisor   Nurse Notes: none

## 2019-04-20 NOTE — Patient Instructions (Signed)
[name] , Thank you for taking time to come for your Medicare Wellness Visit. I appreciate your ongoing commitment to your health goals. Please review the following plan we discussed and let me know if I can assist you in the future.   Screening recommendations/referrals: Colonoscopy: up to date Mammogram: up to date Bone Density: up to date Recommended yearly ophthalmology/optometry visit for glaucoma screening and checkup Recommended yearly dental visit for hygiene and checkup  Vaccinations: Influenza vaccine: up to date Pneumococcal vaccine: up to date Tdap vaccine: up to date Shingles vaccine: shingrix eligible     Advanced directives: Please bring a copy of your health care power of attorney and living will to the office at your convenience.  Conditions/risks identified: diabetic, discussed chronic care management team   Next appointment: Follow up in one year for your annual wellness visit    Preventive Care 65 Years and Older, Female Preventive care refers to lifestyle choices and visits with your health care provider that can promote health and wellness. What does preventive care include?  A yearly physical exam. This is also called an annual well check.  Dental exams once or twice a year.  Routine eye exams. Ask your health care provider how often you should have your eyes checked.  Personal lifestyle choices, including:  Daily care of your teeth and gums.  Regular physical activity.  Eating a healthy diet.  Avoiding tobacco and drug use.  Limiting alcohol use.  Practicing safe sex.  Taking low-dose aspirin every day.  Taking vitamin and mineral supplements as recommended by your health care provider. What happens during an annual well check? The services and screenings done by your health care provider during your annual well check will depend on your age, overall health, lifestyle risk factors, and family history of disease. Counseling  Your health  care provider may ask you questions about your:  Alcohol use.  Tobacco use.  Drug use.  Emotional well-being.  Home and relationship well-being.  Sexual activity.  Eating habits.  History of falls.  Memory and ability to understand (cognition).  Work and work Statistician.  Reproductive health. Screening  You may have the following tests or measurements:  Height, weight, and BMI.  Blood pressure.  Lipid and cholesterol levels. These may be checked every 5 years, or more frequently if you are over 27 years old.  Skin check.  Lung cancer screening. You may have this screening every year starting at age 15 if you have a 30-pack-year history of smoking and currently smoke or have quit within the past 15 years.  Fecal occult blood test (FOBT) of the stool. You may have this test every year starting at age 50.  Flexible sigmoidoscopy or colonoscopy. You may have a sigmoidoscopy every 5 years or a colonoscopy every 10 years starting at age 8.  Hepatitis C blood test.  Hepatitis B blood test.  Sexually transmitted disease (STD) testing.  Diabetes screening. This is done by checking your blood sugar (glucose) after you have not eaten for a while (fasting). You may have this done every 1-3 years.  Bone density scan. This is done to screen for osteoporosis. You may have this done starting at age 66.  Mammogram. This may be done every 1-2 years. Talk to your health care provider about how often you should have regular mammograms. Talk with your health care provider about your test results, treatment options, and if necessary, the need for more tests. Vaccines  Your health care provider  may recommend certain vaccines, such as:  Influenza vaccine. This is recommended every year.  Tetanus, diphtheria, and acellular pertussis (Tdap, Td) vaccine. You may need a Td booster every 10 years.  Zoster vaccine. You may need this after age 79.  Pneumococcal 13-valent conjugate  (PCV13) vaccine. One dose is recommended after age 24.  Pneumococcal polysaccharide (PPSV23) vaccine. One dose is recommended after age 10. Talk to your health care provider about which screenings and vaccines you need and how often you need them. This information is not intended to replace advice given to you by your health care provider. Make sure you discuss any questions you have with your health care provider. Document Released: 05/19/2015 Document Revised: 01/10/2016 Document Reviewed: 02/21/2015 Elsevier Interactive Patient Education  2017 [employer] Prevention in the Home Falls can cause injuries. They can happen to people of all ages. There are many things you can do to make your home safe and to help prevent falls. What can I do on the outside of my home?  Regularly fix the edges of walkways and driveways and fix any cracks.  Remove anything that might make you trip as you walk through a door, such as a raised step or threshold.  Trim any bushes or trees on the path to your home.  Use bright outdoor lighting.  Clear any walking paths of anything that might make someone trip, such as rocks or tools.  Regularly check to see if handrails are loose or broken. Make sure that both sides of any steps have handrails.  Any raised decks and porches should have guardrails on the edges.  Have any leaves, snow, or ice cleared regularly.  Use sand or salt on walking paths during winter.  Clean up any spills in your garage right away. This includes oil or grease spills. What can I do in the bathroom?  Use night lights.  Install grab bars by the toilet and in the tub and shower. Do not use towel bars as grab bars.  Use non-skid mats or decals in the tub or shower.  If you need to sit down in the shower, use a plastic, non-slip stool.  Keep the floor dry. Clean up any water that spills on the floor as soon as it happens.  Remove soap buildup in the tub or shower  regularly.  Attach bath mats securely with double-sided non-slip rug tape.  Do not have throw rugs and other things on the floor that can make you trip. What can I do in the bedroom?  Use night lights.  Make sure that you have a light by your bed that is easy to reach.  Do not use any sheets or blankets that are too big for your bed. They should not hang down onto the floor.  Have a firm chair that has side arms. You can use this for support while you get dressed.  Do not have throw rugs and other things on the floor that can make you trip. What can I do in the kitchen?  Clean up any spills right away.  Avoid walking on wet floors.  Keep items that you use a lot in easy-to-reach places.  If you need to reach something above you, use a strong step stool that has a grab bar.  Keep electrical cords out of the way.  Do not use floor polish or wax that makes floors slippery. If you must use wax, use non-skid floor wax.  Do not have throw rugs  and other things on the floor that can make you trip. What can I do with my stairs?  Do not leave any items on the stairs.  Make sure that there are handrails on both sides of the stairs and use them. Fix handrails that are broken or loose. Make sure that handrails are as long as the stairways.  Check any carpeting to make sure that it is firmly attached to the stairs. Fix any carpet that is loose or worn.  Avoid having throw rugs at the top or bottom of the stairs. If you do have throw rugs, attach them to the floor with carpet tape.  Make sure that you have a light switch at the top of the stairs and the bottom of the stairs. If you do not have them, ask someone to add them for you. What else can I do to help prevent falls?  Wear shoes that:  Do not have high heels.  Have rubber bottoms.  Are comfortable and fit you well.  Are closed at the toe. Do not wear sandals.  If you use a stepladder:  Make sure that it is fully  opened. Do not climb a closed stepladder.  Make sure that both sides of the stepladder are locked into place.  Ask someone to hold it for you, if possible.  Clearly mark and make sure that you can see:  Any grab bars or handrails.  First and last steps.  Where the edge of each step is.  Use tools that help you move around (mobility aids) if they are needed. These include:  Canes.  Walkers.  Scooters.  Crutches.  Turn on the lights when you go into a dark area. Replace any light bulbs as soon as they burn out.  Set up your furniture so you have a clear path. Avoid moving your furniture around.  If any of your floors are uneven, fix them.  If there are any pets around you, be aware of where they are.  Review your medicines with your doctor. Some medicines can make you feel dizzy. This can increase your chance of falling. Ask your doctor what other things that you can do to help prevent falls. This information is not intended to replace advice given to you by your health care provider. Make sure you discuss any questions you have with your health care provider. Document Released: 02/16/2009 Document Revised: 09/28/2015 Document Reviewed: 05/27/2014 Elsevier Interactive Patient Education  2017 [employer].

## 2019-04-22 DIAGNOSIS — M1711 Unilateral primary osteoarthritis, right knee: Secondary | ICD-10-CM | POA: Diagnosis not present

## 2019-04-22 DIAGNOSIS — R768 Other specified abnormal immunological findings in serum: Secondary | ICD-10-CM | POA: Insufficient documentation

## 2019-04-22 DIAGNOSIS — M19041 Primary osteoarthritis, right hand: Secondary | ICD-10-CM | POA: Diagnosis not present

## 2019-04-22 DIAGNOSIS — M2550 Pain in unspecified joint: Secondary | ICD-10-CM | POA: Insufficient documentation

## 2019-04-22 DIAGNOSIS — M19042 Primary osteoarthritis, left hand: Secondary | ICD-10-CM | POA: Diagnosis not present

## 2019-04-22 DIAGNOSIS — D6489 Other specified anemias: Secondary | ICD-10-CM | POA: Diagnosis not present

## 2019-04-26 ENCOUNTER — Encounter: Payer: Self-pay | Admitting: Family Medicine

## 2019-05-04 ENCOUNTER — Other Ambulatory Visit: Payer: Medicare Other

## 2019-05-04 ENCOUNTER — Ambulatory Visit: Payer: Medicare Other | Admitting: Internal Medicine

## 2019-05-17 ENCOUNTER — Encounter: Payer: Self-pay | Admitting: Internal Medicine

## 2019-05-17 ENCOUNTER — Inpatient Hospital Stay: Payer: Medicare Other | Attending: Internal Medicine

## 2019-05-17 ENCOUNTER — Other Ambulatory Visit: Payer: Self-pay

## 2019-05-17 ENCOUNTER — Inpatient Hospital Stay: Payer: Medicare Other | Admitting: Internal Medicine

## 2019-05-17 DIAGNOSIS — D500 Iron deficiency anemia secondary to blood loss (chronic): Secondary | ICD-10-CM

## 2019-05-17 DIAGNOSIS — E538 Deficiency of other specified B group vitamins: Secondary | ICD-10-CM | POA: Diagnosis not present

## 2019-05-17 DIAGNOSIS — D509 Iron deficiency anemia, unspecified: Secondary | ICD-10-CM | POA: Diagnosis not present

## 2019-05-17 DIAGNOSIS — Z79899 Other long term (current) drug therapy: Secondary | ICD-10-CM | POA: Insufficient documentation

## 2019-05-17 DIAGNOSIS — I350 Nonrheumatic aortic (valve) stenosis: Secondary | ICD-10-CM | POA: Diagnosis not present

## 2019-05-17 DIAGNOSIS — E119 Type 2 diabetes mellitus without complications: Secondary | ICD-10-CM | POA: Insufficient documentation

## 2019-05-17 DIAGNOSIS — D72819 Decreased white blood cell count, unspecified: Secondary | ICD-10-CM | POA: Insufficient documentation

## 2019-05-17 DIAGNOSIS — I10 Essential (primary) hypertension: Secondary | ICD-10-CM | POA: Insufficient documentation

## 2019-05-17 DIAGNOSIS — Z7982 Long term (current) use of aspirin: Secondary | ICD-10-CM | POA: Insufficient documentation

## 2019-05-17 DIAGNOSIS — E079 Disorder of thyroid, unspecified: Secondary | ICD-10-CM | POA: Diagnosis not present

## 2019-05-17 DIAGNOSIS — D649 Anemia, unspecified: Secondary | ICD-10-CM

## 2019-05-17 LAB — CBC WITH DIFFERENTIAL/PLATELET
Abs Immature Granulocytes: 0.01 10*3/uL (ref 0.00–0.07)
Basophils Absolute: 0 10*3/uL (ref 0.0–0.1)
Basophils Relative: 1 %
Eosinophils Absolute: 0.1 10*3/uL (ref 0.0–0.5)
Eosinophils Relative: 3 %
HCT: 29.6 % — ABNORMAL LOW (ref 36.0–46.0)
Hemoglobin: 8.7 g/dL — ABNORMAL LOW (ref 12.0–15.0)
Immature Granulocytes: 0 %
Lymphocytes Relative: 42 %
Lymphs Abs: 1.6 10*3/uL (ref 0.7–4.0)
MCH: 29.5 pg (ref 26.0–34.0)
MCHC: 29.4 g/dL — ABNORMAL LOW (ref 30.0–36.0)
MCV: 100.3 fL — ABNORMAL HIGH (ref 80.0–100.0)
Monocytes Absolute: 0.5 10*3/uL (ref 0.1–1.0)
Monocytes Relative: 15 %
Neutro Abs: 1.4 10*3/uL — ABNORMAL LOW (ref 1.7–7.7)
Neutrophils Relative %: 39 %
Platelets: 231 10*3/uL (ref 150–400)
RBC: 2.95 MIL/uL — ABNORMAL LOW (ref 3.87–5.11)
RDW: 14.6 % (ref 11.5–15.5)
WBC: 3.6 10*3/uL — ABNORMAL LOW (ref 4.0–10.5)
nRBC: 0 % (ref 0.0–0.2)

## 2019-05-17 NOTE — Assessment & Plan Note (Addendum)
#  Normocytic anemia unclear etiology; previous history of AVM iron deficiency; September 2020-iron studies negative for deficiency.  Bone marrow biopsy negative for any significant dyspoiesis.  Cytogenetics normal.  #Today hemoglobin is 8.7; slightly improved from 8.3.  Patient is clinically improved/eating well more energy levels.  We will continue to monitor at this time.  We will also reviewed the tumor conference.                             # Mild-mod intermittent neutropenia-benign ethnic neutropenia.  Asymtomatic.  Absolute neutrophil count is 1.4.  Stable.  Monitor for now.  #Relative B12 deficiency-200; recommend sublingual B12 tablets.  I spoke at length with the patient/daughter- regarding the patient's clinical status/plan of care.  Family agreement.   # DISPOSITION:  # in 1 month-cbc # in 2 months-MD; labs- cbc/bmp/ b12 levels-Dr.B-

## 2019-05-17 NOTE — Patient Instructions (Signed)
#   Take b12 sublingual 1000 mcg once a day

## 2019-05-17 NOTE — Progress Notes (Signed)
[place] NOTE  Patient Care Team: [name], DO as PCP - General (Family Medicine) Cammie Sickle, MD as Consulting Physician (Internal Medicine) Lorelee Cover., MD (Ophthalmology) End, Harrell Gave, MD as Consulting Physician (Cardiology)  CHIEF COMPLAINTS/PURPOSE OF CONSULTATION:   # AUG 2016- IRON DEFICIENCY ANEMIA-  Possible AVMs of colon [s/p Argon laser; Dr.Elliot;EGD-neg Nov 2016] s/p IV Ferriheme x2 [[id number]; ALLERGY]; Venofer IV [pre-meds]; worsening anemia not responding to iron- November 2020-bone marrow biopsy-erythroid hyperplasia but no dysplasia no blasts. NOV 2020- CT A/P-negative; CXR-NEG.  #Relative B12 deficiency-sublingual B12 [Jan 2021]  #December 2020 -elevated anti-CCP- [s/p evaluation Dr. Elder Negus  # MILD LEUCOPENIA/NEUTROPENIA [facility] 1.2-1.3]-chronic ethnic neutropenia/asymptomatic  # Rectal well diff carcinoid [1.2CM; s/p polypectomy 2011]; Allergy- [facility] Bouchard Philomena Course 2016]- neck/face swelling [improved with benadryl]  HISTORY OF PRESENTING ILLNESS:  Diana Harmon 74 y.o.  female is here for follow-up for anemia of unclear etiology is here for follow-up.  Patient was recently evaluated by Dr. Meda Coffee rheumatology-for elevated anti-CCP.  Recommended continued surveillance at this time.  Patient states her energy levels are improving.  Although she is mildly tired.  Appetite is improving.  No nausea no vomiting.   Review of Systems  Constitutional: Positive for malaise/fatigue. Negative for chills, diaphoresis, fever and weight loss.  HENT: Negative for nosebleeds and sore throat.   Eyes: Negative for double vision.  Respiratory: Negative for cough, hemoptysis, sputum production, shortness of breath and wheezing.   Cardiovascular: Negative for chest pain, palpitations, orthopnea and leg swelling.  Gastrointestinal: Negative for abdominal pain, blood in stool, constipation, diarrhea, heartburn, melena,  nausea and vomiting.  Genitourinary: Negative for dysuria, frequency and urgency.  Musculoskeletal: Positive for back pain and joint pain.  Skin: Negative.  Negative for itching and rash.  Neurological: Negative for dizziness, tingling, focal weakness, weakness and headaches.  Endo/Heme/Allergies: Does not bruise/bleed easily.  Psychiatric/Behavioral: Negative for depression. The patient is not nervous/anxious and does not have insomnia.      MEDICAL HISTORY:  Past Medical History:  Diagnosis Date  . Anemia   . Aortic stenosis   . Benign tumor   . Diabetes mellitus without complication ([place])   . Hypertension   . Nausea   . Patient is [name]   . Thyroid disease     SURGICAL HISTORY: Past Surgical History:  Procedure Laterality Date  . COLONOSCOPY    . COLONOSCOPY WITH PROPOFOL N/A 03/13/2015   Procedure: COLONOSCOPY WITH PROPOFOL;  Surgeon: Manya Silvas, MD;  Location[facility] ENDOSCOPY;  Service: Endoscopy;  Laterality: N/A;  . ESOPHAGOGASTRODUODENOSCOPY N/A 03/13/2015   Procedure: ESOPHAGOGASTRODUODENOSCOPY (EGD);  Surgeon: Manya Silvas, MD;  Location: [facility] ENDOSCOPY;  Service: Endoscopy;  Laterality: N/A;  . THYROIDECTOMY    . tumor removed      SOCIAL HISTORY: Social History   Socioeconomic History  . Marital status: Divorced    Spouse name: Not on file  . Number of children: 1  . Years of education: 46  . Highest education level: 12th grade  Occupational History  . Occupation: retired  Tobacco Use  . Smoking status: Never Smoker  . Smokeless tobacco: Never Used  Substance and Sexual Activity  . Alcohol use: Yes    Alcohol/week: 0.0 - 1.0 standard drinks    Comment: occ. wine  . Drug use: No  . Sexual activity: Not on file  Other Topics Concern  . Not on file  Social History Narrative  . Not on file   Social  Determinants of Health   Financial Resource Strain:   . Difficulty of Paying Living Expenses: Not on file  Food Insecurity:   .  Worried About Charity fundraiser in the Last Year: Not on file  . Ran Out of Food in the Last Year: Not on file  Transportation Needs: No Transportation Needs  . Lack of Transportation (Medical): No  . Lack of Transportation (Non-Medical): No  Physical Activity: Inactive  . Days of Exercise per Week: 0 days  . Minutes of Exercise per Session: 0 min  Stress:   . Feeling of Stress : Not on file  Social Connections:   . Frequency of Communication with Friends and Family: Not on file  . Frequency of Social Gatherings with Friends and Family: Not on file  . Attends Religious Services: Not on file  . Active Member of Clubs or Organizations: Not on file  . Attends Archivist Meetings: Not on file  . Marital Status: Not on file  Intimate Partner Violence:   . Fear of Current or Ex-Partner: Not on file  . Emotionally Abused: Not on file  . Physically Abused: Not on file  . Sexually Abused: Not on file    FAMILY HISTORY: Family History  Problem Relation Age of Onset  . Parkinson's disease Mother   . Heart disease Father   . Lupus Sister   . Aneurysm Son   . Lupus Son   . Breast cancer Maternal Aunt        mat great aunt    ALLERGIES:  is allergic to ferumoxytol; other; and penicillins.  MEDICATIONS:  Current Outpatient Medications  Medication Sig Dispense Refill  . acetaminophen (TYLENOL) 500 MG tablet Take 1,000 mg by mouth every 6 (six) hours as needed for moderate pain.    Marland Kitchen aspirin EC 81 MG tablet Take 1 tablet (81 mg total) by mouth daily. 100 tablet 12  . baclofen (LIORESAL) 10 MG tablet Take 1-2 tablets (10-20 mg total) by mouth 2 (two) times daily as needed for muscle spasms. 60 each 1  . Blood Glucose Monitoring Suppl (ACCU-CHEK NANO SMARTVIEW) w/Device KIT Use to check blood glucose up to once daily as instructed 1 kit 0  . cholecalciferol (VITAMIN D) 1000 units tablet Take 2,000 Units by mouth daily.     . Cromolyn Sodium (NASAL ALLERGY NA) Place 1 spray into  the nose daily as needed (allergies).    . diclofenac sodium (VOLTAREN) 1 % GEL Apply 2 g topically 3 (three) times daily as needed. On affected knee joints 100 g 5  . Ferrous Sulfate (IRON) 325 (65 Fe) MG TABS TAKE 1 TABLET BY MOUTH TWICE DAILY WITH A MEAL 180 each 1  . glucose blood (ACCU-CHEK SMARTVIEW) test strip Check blood glucose up to once daily as instructed 100 each 5  . hydrochlorothiazide (HYDRODIURIL) 25 MG tablet Take 1 tablet by mouth once daily 90 tablet 3  . lactobacillus acidophilus (BACID) TABS tablet Take 2 tablets by mouth 2 (two) times daily. Once in the am as needed    . levocetirizine (XYZAL) 5 MG tablet Take 1 tablet (5 mg total) by mouth every evening. 90 tablet 3  . losartan (COZAAR) 50 MG tablet Take 1 tablet by mouth once daily 90 tablet 1  . metFORMIN (GLUCOPHAGE) 500 MG tablet Take 1 tablet (500 mg total) by mouth daily with supper. 90 tablet 1  . simvastatin (ZOCOR) 20 MG tablet TAKE 1 TABLET BY MOUTH ONCE DAILY AT 6  PM 90 tablet 1   No current facility-administered medications for this visit.      Marland Kitchen  PHYSICAL EXAMINATION: ECOG PERFORMANCE STATUS: 1 - Symptomatic but completely ambulatory  Vitals:   05/17/19 1424  BP: 117/73  Pulse: 83  Temp: (!) 96.9 F (36.1 C)   Filed Weights   05/17/19 1424  Weight: 220 lb (99.8 kg)    Physical Exam  Constitutional: She is oriented to person, place, and time and well-developed, well-nourished, and in no distress.  Patient is obese.  In a wheelchair.  Accompanied by daughter.  HENT:  Head: Normocephalic and atraumatic.  Mouth/Throat: Oropharynx is clear and moist. No oropharyngeal exudate.  Eyes: Pupils are equal, round, and reactive to light.  Cardiovascular: Normal rate and regular rhythm.  Pulmonary/Chest: Effort normal and breath sounds normal. No respiratory distress. She has no wheezes.  Abdominal: Soft. Bowel sounds are normal. She exhibits no distension and no mass. There is no abdominal tenderness.  There is no rebound and no guarding.  Musculoskeletal:        General: No tenderness or edema. Normal range of motion.     Cervical back: Normal range of motion and neck supple.  Neurological: She is alert and oriented to person, place, and time.  Skin: Skin is warm.  Psychiatric: Affect normal.    LABORATORY DATA:  I have reviewed the data as listed Lab Results  Component Value Date   WBC 3.6 (L) 05/17/2019   HGB 8.7 (L) 05/17/2019   HCT 29.6 (L) 05/17/2019   MCV 100.3 (H) 05/17/2019   PLT 231 05/17/2019   Recent Labs    11/16/18 0816 03/25/19 0914  NA 137  --   K 4.3  --   CL 106  --   CO2 23  --   GLUCOSE 110*  --   BUN 30*  --   CREATININE 1.02* 0.90  CALCIUM 9.3  --   GFRNONAA 55*  --   GFRAA >60  --     ASSESSMENT & PLAN:  Normocytic anemia #Normocytic anemia unclear etiology; previous history of AVM iron deficiency; September 2020-iron studies negative for deficiency.  Bone marrow biopsy negative for any significant dyspoiesis.  Cytogenetics normal.  #Today hemoglobin is 8.7; slightly improved from 8.3.  Patient is clinically improved/eating well more energy levels.  We will continue to monitor at this time.  We will also reviewed the tumor conference.                             # Mild-mod intermittent neutropenia-benign ethnic neutropenia.  Asymtomatic.  Absolute neutrophil count is 1.4.  Stable.  Monitor for now.  #Relative B12 deficiency-200; recommend sublingual B12 tablets.  I spoke at length with the patient/daughter- regarding the patient's clinical status/plan of care.  Family agreement.   # DISPOSITION:  # in 1 month-cbc # in 2 months-MD; labs- cbc/bmp/ b12 levels-Dr.B-       Cammie Sickle, MD 05/18/2019 2:45 PM

## 2019-05-25 ENCOUNTER — Other Ambulatory Visit: Payer: Self-pay | Admitting: Internal Medicine

## 2019-05-25 DIAGNOSIS — D649 Anemia, unspecified: Secondary | ICD-10-CM

## 2019-06-11 ENCOUNTER — Other Ambulatory Visit: Payer: Self-pay

## 2019-06-14 ENCOUNTER — Inpatient Hospital Stay: Payer: Medicare Other | Attending: Internal Medicine

## 2019-06-14 ENCOUNTER — Other Ambulatory Visit: Payer: Self-pay

## 2019-06-14 DIAGNOSIS — D500 Iron deficiency anemia secondary to blood loss (chronic): Secondary | ICD-10-CM | POA: Insufficient documentation

## 2019-06-14 DIAGNOSIS — D649 Anemia, unspecified: Secondary | ICD-10-CM

## 2019-06-14 LAB — CBC WITH DIFFERENTIAL/PLATELET
Abs Immature Granulocytes: 0.01 10*3/uL (ref 0.00–0.07)
Basophils Absolute: 0 10*3/uL (ref 0.0–0.1)
Basophils Relative: 1 %
Eosinophils Absolute: 0.1 10*3/uL (ref 0.0–0.5)
Eosinophils Relative: 4 %
HCT: 32.9 % — ABNORMAL LOW (ref 36.0–46.0)
Hemoglobin: 9.7 g/dL — ABNORMAL LOW (ref 12.0–15.0)
Immature Granulocytes: 0 %
Lymphocytes Relative: 44 %
Lymphs Abs: 1.2 10*3/uL (ref 0.7–4.0)
MCH: 28.6 pg (ref 26.0–34.0)
MCHC: 29.5 g/dL — ABNORMAL LOW (ref 30.0–36.0)
MCV: 97.1 fL (ref 80.0–100.0)
Monocytes Absolute: 0.4 10*3/uL (ref 0.1–1.0)
Monocytes Relative: 15 %
Neutro Abs: 1 10*3/uL — ABNORMAL LOW (ref 1.7–7.7)
Neutrophils Relative %: 36 %
Platelets: 215 10*3/uL (ref 150–400)
RBC: 3.39 MIL/uL — ABNORMAL LOW (ref 3.87–5.11)
RDW: 13.8 % (ref 11.5–15.5)
WBC: 2.8 10*3/uL — ABNORMAL LOW (ref 4.0–10.5)
nRBC: 0 % (ref 0.0–0.2)

## 2019-06-28 ENCOUNTER — Telehealth: Payer: Self-pay | Admitting: Family Medicine

## 2019-06-28 NOTE — Chronic Care Management (AMB) (Signed)
  Chronic Care Management   Note  06/28/2019 Name: Diana Harmon MRN: [id number] DOB: 08-04-1945  Diana Harmon is a 74 y.o. year old female who is a primary care patient of [name], DO. I reached out to [employer] by phone today in response to a referral sent by Ms. Diana Del Dent's health plan.     [name] was given information about Chronic Care Management services today including:  1. CCM service includes personalized support from designated clinical staff supervised by her physician, including individualized plan of care and coordination with other care providers 2. 24/7 contact phone numbers for assistance for urgent and routine care needs. 3. Service will only be billed when office clinical staff spend 20 minutes or more in a month to coordinate care. 4. Only one practitioner may furnish and bill the service in a calendar month. 5. The patient may stop CCM services at any time (effective at the end of the month) by phone call to the office staff. 6. The patient will be responsible for cost sharing (co-pay) of up to 20% of the service fee (after annual deductible is met).  Patient agreed to services and verbal consent obtained.   Follow up plan: Telephone appointment with care management team member scheduled for:08/12/2019  Noreene Larsson, [address], [place], [place] [address] Direct Dial: [phone] [email or web address] Website: [email or web address]

## 2019-07-06 ENCOUNTER — Other Ambulatory Visit: Payer: Self-pay | Admitting: Family Medicine

## 2019-07-06 DIAGNOSIS — M62838 Other muscle spasm: Secondary | ICD-10-CM

## 2019-07-12 ENCOUNTER — Inpatient Hospital Stay: Payer: Medicare Other | Admitting: Internal Medicine

## 2019-07-12 ENCOUNTER — Inpatient Hospital Stay: Payer: Medicare Other | Attending: Internal Medicine

## 2019-07-12 ENCOUNTER — Other Ambulatory Visit: Payer: Self-pay

## 2019-07-12 DIAGNOSIS — Z7984 Long term (current) use of oral hypoglycemic drugs: Secondary | ICD-10-CM | POA: Insufficient documentation

## 2019-07-12 DIAGNOSIS — D709 Neutropenia, unspecified: Secondary | ICD-10-CM | POA: Diagnosis not present

## 2019-07-12 DIAGNOSIS — E079 Disorder of thyroid, unspecified: Secondary | ICD-10-CM | POA: Diagnosis not present

## 2019-07-12 DIAGNOSIS — Z85048 Personal history of other malignant neoplasm of rectum, rectosigmoid junction, and anus: Secondary | ICD-10-CM | POA: Diagnosis not present

## 2019-07-12 DIAGNOSIS — D649 Anemia, unspecified: Secondary | ICD-10-CM

## 2019-07-12 DIAGNOSIS — D72819 Decreased white blood cell count, unspecified: Secondary | ICD-10-CM | POA: Insufficient documentation

## 2019-07-12 DIAGNOSIS — Z79899 Other long term (current) drug therapy: Secondary | ICD-10-CM | POA: Diagnosis not present

## 2019-07-12 DIAGNOSIS — D509 Iron deficiency anemia, unspecified: Secondary | ICD-10-CM | POA: Diagnosis not present

## 2019-07-12 DIAGNOSIS — Z7982 Long term (current) use of aspirin: Secondary | ICD-10-CM | POA: Insufficient documentation

## 2019-07-12 DIAGNOSIS — E538 Deficiency of other specified B group vitamins: Secondary | ICD-10-CM | POA: Insufficient documentation

## 2019-07-12 DIAGNOSIS — E119 Type 2 diabetes mellitus without complications: Secondary | ICD-10-CM | POA: Diagnosis not present

## 2019-07-12 DIAGNOSIS — I350 Nonrheumatic aortic (valve) stenosis: Secondary | ICD-10-CM | POA: Diagnosis not present

## 2019-07-12 DIAGNOSIS — I10 Essential (primary) hypertension: Secondary | ICD-10-CM | POA: Insufficient documentation

## 2019-07-12 LAB — BASIC METABOLIC PANEL
Anion gap: 8 (ref 5–15)
BUN: 21 mg/dL (ref 8–23)
CO2: 24 mmol/L (ref 22–32)
Calcium: 9.2 mg/dL (ref 8.9–10.3)
Chloride: 106 mmol/L (ref 98–111)
Creatinine, Ser: 1 mg/dL (ref 0.44–1.00)
GFR calc Af Amer: >60 mL/min (ref >60)
GFR calc non Af Amer: 56 mL/min — ABNORMAL LOW (ref >60)
Glucose, Bld: 141 mg/dL — ABNORMAL HIGH (ref 70–99)
Potassium: 4.1 mmol/L (ref 3.5–5.1)
Sodium: 138 mmol/L (ref 135–145)

## 2019-07-12 LAB — CBC WITH DIFFERENTIAL/PLATELET
Abs Immature Granulocytes: 0.01 10*3/uL (ref 0.00–0.07)
Basophils Absolute: 0 10*3/uL (ref 0.0–0.1)
Basophils Relative: 1 %
Eosinophils Absolute: 0.1 10*3/uL (ref 0.0–0.5)
Eosinophils Relative: 4 %
HCT: 33.9 % — ABNORMAL LOW (ref 36.0–46.0)
Hemoglobin: 10.4 g/dL — ABNORMAL LOW (ref 12.0–15.0)
Immature Granulocytes: 0 %
Lymphocytes Relative: 43 %
Lymphs Abs: 1.3 10*3/uL (ref 0.7–4.0)
MCH: 29.2 pg (ref 26.0–34.0)
MCHC: 30.7 g/dL (ref 30.0–36.0)
MCV: 95.2 fL (ref 80.0–100.0)
Monocytes Absolute: 0.4 10*3/uL (ref 0.1–1.0)
Monocytes Relative: 14 %
Neutro Abs: 1.2 10*3/uL — ABNORMAL LOW (ref 1.7–7.7)
Neutrophils Relative %: 38 %
Platelets: 208 10*3/uL (ref 150–400)
RBC: 3.56 MIL/uL — ABNORMAL LOW (ref 3.87–5.11)
RDW: 13.8 % (ref 11.5–15.5)
WBC: 3.1 10*3/uL — ABNORMAL LOW (ref 4.0–10.5)
nRBC: 0 % (ref 0.0–0.2)

## 2019-07-12 LAB — VITAMIN B12: Vitamin B-12: 2417 pg/mL — ABNORMAL HIGH (ref 180–914)

## 2019-07-12 LAB — RETICULOCYTES
Immature Retic Fract: 9.2 % (ref 2.3–15.9)
RBC.: 3.58 MIL/uL — ABNORMAL LOW (ref 3.87–5.11)
Retic Count, Absolute: 38.7 10*3/uL (ref 19.0–186.0)
Retic Ct Pct: 1.1 % (ref 0.4–3.1)

## 2019-07-12 NOTE — Assessment & Plan Note (Addendum)
#  Normocytic anemia unclear etiology; previous history of AVM iron deficiency; September 2020-iron studies negative for deficiency.  Bone marrow biopsy negative for any significant dyspoiesis.  Cytogenetics normal.  #Today hemoglobin is 10.4; slowly improving.  Patient's baseline hemoglobin is around 11.                             #Chronic benign ethnic neutropenia-neutrophil count 1.2.  Asymptomatic.  #Relative B12 deficiency-200; continue sublingual B12 tablets.  Awaiting B12 levels today.  I spoke at length with the patient/daughter- regarding the patient's clinical status/plan of care.  Family agreement.   # DISPOSITION: print of cbc # in 3 months-MD; labs- cbc/bmp--Dr.B

## 2019-07-12 NOTE — Progress Notes (Signed)
[place] NOTE  Patient Care Team: [name], DO as PCP - General (Family Medicine) Cammie Sickle, MD as Consulting Physician (Internal Medicine) Lorelee Cover., MD (Ophthalmology) End, Harrell Gave, MD as Consulting Physician (Cardiology) Vanita Ingles, RN as Registered Nurse (General Practice)  CHIEF COMPLAINTS/PURPOSE OF CONSULTATION:   # AUG 2016- IRON DEFICIENCY ANEMIA-  Possible AVMs of colon [s/p Argon laser; Dr.Elliot;EGD-neg Nov 2016] s/p IV Ferriheme x2 [[id number]; ALLERGY]; Venofer IV [pre-meds]; worsening anemia not responding to iron- November 2020-bone marrow biopsy-erythroid hyperplasia but no dysplasia no blasts. NOV 2020- CT A/P-negative; CXR-NEG.  #Relative B12 deficiency-sublingual B12 [Jan 2021]  #December 2020 -elevated anti-CCP- [s/p evaluation Dr. Elder Negus  # MILD LEUCOPENIA/NEUTROPENIA 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  female is here for follow-up for anemia of unclear etiology is here for follow-up.  Patient's her appetite is good.  Denies any weight loss.  Denies any chest pain.  No nausea no vomiting.  Energy levels improving.  Review of Systems  Constitutional: Negative for chills, diaphoresis, fever and weight loss.  HENT: Negative for nosebleeds and sore throat.   Eyes: Negative for double vision.  Respiratory: Negative for cough, hemoptysis, sputum production, shortness of breath and wheezing.   Cardiovascular: Negative for chest pain, palpitations, orthopnea and leg swelling.  Gastrointestinal: Negative for abdominal pain, blood in stool, constipation, diarrhea, heartburn, melena, nausea and vomiting.  Genitourinary: Negative for dysuria, frequency and urgency.   Musculoskeletal: Positive for joint pain. Negative for back pain.  Skin: Negative.  Negative for itching and rash.  Neurological: Negative for dizziness, tingling, focal weakness, weakness and headaches.  Endo/Heme/Allergies: Does not bruise/bleed easily.  Psychiatric/Behavioral: Negative for depression. The patient is not nervous/anxious and does not have insomnia.      MEDICAL HISTORY:  Past Medical History:  Diagnosis Date  . Anemia   . Aortic stenosis   . Benign tumor   . Diabetes mellitus without complication ([place])   . Hypertension   . Nausea   . Patient is [name]   . Thyroid disease     SURGICAL HISTORY: Past Surgical History:  Procedure Laterality Date  . COLONOSCOPY    . COLONOSCOPY WITH PROPOFOL N/A 03/13/2015   Procedure: COLONOSCOPY WITH PROPOFOL;  Surgeon: Manya Silvas, MD;  Location: [facility] ENDOSCOPY;  Service: Endoscopy;  Laterality: N/A;  . ESOPHAGOGASTRODUODENOSCOPY N/A 03/13/2015   Procedure: ESOPHAGOGASTRODUODENOSCOPY (EGD);  Surgeon: Manya Silvas, MD;  Location: [facility] ENDOSCOPY;  Service: Endoscopy;  Laterality: N/A;  . THYROIDECTOMY    . tumor removed      SOCIAL HISTORY: Social History   Socioeconomic History  . Marital status: Divorced    Spouse name: Not on file  . Number of children: 1  . Years of education: 55  . Highest education level: 12th grade  Occupational History  . Occupation: retired  Tobacco Use  . Smoking status: Never Smoker  . Smokeless tobacco: Never Used  Substance and Sexual Activity  . Alcohol use: Yes    Alcohol/week: 0.0 - 1.0 standard drinks    Comment: occ. wine  . Drug use: No  . Sexual activity: Not on file  Other Topics Concern  . Not on file  Social History Narrative  . Not on file   Social Determinants of Health   Financial Resource Strain:   .  Difficulty of Paying Living Expenses: Not on file  Food Insecurity:   . Worried About Charity fundraiser in the Last Year: Not on file  . Ran  Out of Food in the Last Year: Not on file  Transportation Needs: No Transportation Needs  . Lack of Transportation (Medical): No  . Lack of Transportation (Non-Medical): No  Physical Activity: Inactive  . Days of Exercise per Week: 0 days  . Minutes of Exercise per Session: 0 min  Stress:   . Feeling of Stress : Not on file  Social Connections:   . Frequency of Communication with Friends and Family: Not on file  . Frequency of Social Gatherings with Friends and Family: Not on file  . Attends Religious Services: Not on file  . Active Member of Clubs or Organizations: Not on file  . Attends Archivist Meetings: Not on file  . Marital Status: Not on file  Intimate Partner Violence:   . Fear of Current or Ex-Partner: Not on file  . Emotionally Abused: Not on file  . Physically Abused: Not on file  . Sexually Abused: Not on file    FAMILY HISTORY: Family History  Problem Relation Age of Onset  . Parkinson's disease Mother   . Heart disease Father   . Lupus Sister   . Aneurysm Son   . Lupus Son   . Breast cancer Maternal Aunt        mat great aunt    ALLERGIES:  is allergic to ferumoxytol; other; and penicillins.  MEDICATIONS:  Current Outpatient Medications  Medication Sig Dispense Refill  . acetaminophen (TYLENOL) 500 MG tablet Take 1,000 mg by mouth every 6 (six) hours as needed for moderate pain.    Marland Kitchen aspirin EC 81 MG tablet Take 1 tablet (81 mg total) by mouth daily. 100 tablet 12  . baclofen (LIORESAL) 10 MG tablet TAKE 1 TO 2 TABLETS BY MOUTH TWICE DAILY AS NEEDED FOR MUSCLE SPASM 60 tablet 2  . Blood Glucose Monitoring Suppl (ACCU-CHEK NANO SMARTVIEW) w/Device KIT Use to check blood glucose up to once daily as instructed 1 kit 0  . cholecalciferol (VITAMIN D) 1000 units tablet Take 2,000 Units by mouth daily.     . Cromolyn Sodium (NASAL ALLERGY NA) Place 1 spray into the nose daily as needed (allergies).    . diclofenac sodium (VOLTAREN) 1 % GEL Apply 2 g  topically 3 (three) times daily as needed. On affected knee joints 100 g 5  . Ferrous Sulfate (IRON) 325 (65 Fe) MG TABS TAKE 1 TABLET BY MOUTH TWICE DAILY WITH A MEAL 180 each 1  . glucose blood (ACCU-CHEK SMARTVIEW) test strip Check blood glucose up to once daily as instructed 100 each 5  . hydrochlorothiazide (HYDRODIURIL) 25 MG tablet Take 1 tablet by mouth once daily 90 tablet 3  . lactobacillus acidophilus (BACID) TABS tablet Take 2 tablets by mouth 2 (two) times daily. Once in the am as needed    . levocetirizine (XYZAL) 5 MG tablet Take 1 tablet (5 mg total) by mouth every evening. 90 tablet 3  . losartan (COZAAR) 50 MG tablet Take 1 tablet by mouth once daily 90 tablet 1  . metFORMIN (GLUCOPHAGE) 500 MG tablet Take 1 tablet (500 mg total) by mouth daily with supper. 90 tablet 1  . simvastatin (ZOCOR) 20 MG tablet TAKE 1 TABLET BY MOUTH ONCE DAILY AT 6 PM 90 tablet 1   No current facility-administered medications for this visit.      Marland Kitchen  PHYSICAL EXAMINATION: ECOG PERFORMANCE STATUS: 1 - Symptomatic but completely ambulatory  Vitals:   07/12/19 1454  BP: (!) 144/81  Pulse: 83  Resp: 18  Temp: (!) 97.5 F (36.4 C)  SpO2: 99%   Filed Weights   07/12/19 1454  Weight: 225 lb (102.1 kg)    Physical Exam  Constitutional: She is oriented to person, place, and time and well-developed, well-nourished, and in no distress.  Patient is obese.  In a wheelchair.  Accompanied by daughter.  HENT:  Head: Normocephalic and atraumatic.  Mouth/Throat: Oropharynx is clear and moist. No oropharyngeal exudate.  Eyes: Pupils are equal, round, and reactive to light.  Cardiovascular: Normal rate and regular rhythm.  Pulmonary/Chest: Effort normal and breath sounds normal. No respiratory distress. She has no wheezes.  Abdominal: Soft. Bowel sounds are normal. She exhibits no distension and no mass. There is no abdominal tenderness. There is no rebound and no guarding.  Musculoskeletal:         General: No tenderness or edema. Normal range of motion.     Cervical back: Normal range of motion and neck supple.  Neurological: She is alert and oriented to person, place, and time.  Skin: Skin is warm.  Psychiatric: Affect normal.    LABORATORY DATA:  I have reviewed the data as listed Lab Results  Component Value Date   WBC 3.1 (L) 07/12/2019   HGB 10.4 (L) 07/12/2019   HCT 33.9 (L) 07/12/2019   MCV 95.2 07/12/2019   PLT 208 07/12/2019   Recent Labs    11/16/18 0816 03/25/19 0914 07/12/19 1416  NA 137  --  138  K 4.3  --  4.1  CL 106  --  106  CO2 23  --  24  GLUCOSE 110*  --  141*  BUN 30*  --  21  CREATININE 1.02* 0.90 1.00  CALCIUM 9.3  --  9.2  GFRNONAA 55*  --  56*  GFRAA >60  --  >60    ASSESSMENT & PLAN:  Normocytic anemia #Normocytic anemia unclear etiology; previous history of AVM iron deficiency; September 2020-iron studies negative for deficiency.  Bone marrow biopsy negative for any significant dyspoiesis.  Cytogenetics normal.  #Today hemoglobin is 10.4; slowly improving.  Patient's baseline hemoglobin is around 11.                             #Chronic benign ethnic neutropenia-neutrophil count 1.2.  Asymptomatic.  #Relative B12 deficiency-200; continue sublingual B12 tablets.  Awaiting B12 levels today.  I spoke at length with the patient/daughter- regarding the patient's clinical status/plan of care.  Family agreement.   # DISPOSITION: print of cbc # in 3 months-MD; labs- cbc/bmp--Dr.B       Cammie Sickle, MD 07/13/2019 6:22 AM

## 2019-07-13 LAB — HAPTOGLOBIN: Haptoglobin: 180 mg/dL (ref 42–346)

## 2019-07-16 ENCOUNTER — Other Ambulatory Visit: Payer: Medicare Other

## 2019-07-28 ENCOUNTER — Ambulatory Visit: Payer: Medicare Other | Admitting: Internal Medicine

## 2019-08-12 ENCOUNTER — Telehealth: Payer: Medicare Other

## 2019-08-12 ENCOUNTER — Ambulatory Visit: Payer: Self-pay | Admitting: General Practice

## 2019-08-12 NOTE — Chronic Care Management (AMB) (Signed)
°  Chronic Care Management   Outreach Note  08/12/2019 Name: Diana Harmon MRN: [id number] DOB: [birth year]  Referred by: [name], DO Reason for referral : Chronic Care Management (RNCM Chronic Case management and Care coordination needs: Initial Attempt )   An unsuccessful telephone outreach was attempted today. The patient was referred to the case management team for assistance with care management and care coordination.   Follow Up Plan: A HIPPA compliant phone message was left for the patient providing contact information and requesting a return call.   Noreene Larsson RN, MSN, [place] Mobile: [phone]

## 2019-08-19 ENCOUNTER — Ambulatory Visit (INDEPENDENT_AMBULATORY_CARE_PROVIDER_SITE_OTHER): Payer: Medicare Other

## 2019-08-19 ENCOUNTER — Other Ambulatory Visit: Payer: Self-pay

## 2019-08-19 DIAGNOSIS — I350 Nonrheumatic aortic (valve) stenosis: Secondary | ICD-10-CM

## 2019-08-19 MED ORDER — PERFLUTREN LIPID MICROSPHERE
1.0000 mL | INTRAVENOUS | Status: AC | PRN
  Administered 2019-08-19: 2 mL via INTRAVENOUS

## 2019-08-25 ENCOUNTER — Encounter: Payer: Self-pay | Admitting: Internal Medicine

## 2019-08-25 ENCOUNTER — Other Ambulatory Visit: Payer: Self-pay

## 2019-08-25 ENCOUNTER — Ambulatory Visit: Payer: Medicare Other | Admitting: Internal Medicine

## 2019-08-25 VITALS — BP 126/68 | HR 85 | Ht 60.0 in | Wt 231.0 lb

## 2019-08-25 DIAGNOSIS — I350 Nonrheumatic aortic (valve) stenosis: Secondary | ICD-10-CM

## 2019-08-25 DIAGNOSIS — I10 Essential (primary) hypertension: Secondary | ICD-10-CM

## 2019-08-25 MED ORDER — FUROSEMIDE 20 MG PO TABS: 20.0000 mg | ORAL_TABLET | Freq: Every day | ORAL | 1 refills | Status: DC

## 2019-08-25 NOTE — Progress Notes (Signed)
Follow-up Outpatient Visit Date: 08/25/2019  Primary Care Provider: [name], DO [address] [address]  Chief Complaint: Follow-up aortic stenosis  HPI:  Diana Harmon is a 74 y.o. female with history of mild aortic stenosis, hypertension, hyperlipidemia, diabetes mellitus, and thyroid disease, who presents for follow-up of aortic stenosis.  I last saw her in 06/2018, at which time she was doing well other than bilateral knee pain.  Follow-up echo last week showed progression of aortic stenosis to the moderate to severe range (mean gradient 32 mmHg, peak velocity 4.3 m/s, and valve area 0.85 cm).  Today, Diana Harmon reports she is feeling relatively well.  Her mobility is limited by pain in her back and legs that she attributes to arthritis.  She also notices shortness of breath with modest activity such as doing the dishes for an extended period of time.  She has not had any chest pain or palpitations.  She notes occasional lightheadedness and dizziness, noting that it is somewhat different than the symptoms she is associated with Mnire's disease in the past.  She has also noticed increasing leg swelling over the last few months.  She has not passed out or fallen.  --------------------------------------------------------------------------------------------------  Past Medical History:  Diagnosis Date  . Anemia   . Aortic stenosis   . Benign tumor   . Diabetes mellitus without complication ([place])   . Hypertension   . Nausea   . Patient is [name]   . Thyroid disease    Past Surgical History:  Procedure Laterality Date  . COLONOSCOPY    . COLONOSCOPY WITH PROPOFOL N/A 03/13/2015   Procedure: COLONOSCOPY WITH PROPOFOL;  Surgeon: Manya Silvas, MD;  Location: [facility] ENDOSCOPY;  Service: Endoscopy;  Laterality: N/A;  . ESOPHAGOGASTRODUODENOSCOPY N/A 03/13/2015   Procedure: ESOPHAGOGASTRODUODENOSCOPY (EGD);  Surgeon: Manya Silvas, MD;  Location: [facility]  ENDOSCOPY;  Service: Endoscopy;  Laterality: N/A;  . THYROIDECTOMY    . tumor removed      Current Meds  Medication Sig  . acetaminophen (TYLENOL) 500 MG tablet Take 1,000 mg by mouth every 6 (six) hours as needed for moderate pain.  Marland Kitchen aspirin EC 81 MG tablet Take 1 tablet (81 mg total) by mouth daily.  . baclofen (LIORESAL) 10 MG tablet TAKE 1 TO 2 TABLETS BY MOUTH TWICE DAILY AS NEEDED FOR MUSCLE SPASM  . Blood Glucose Monitoring Suppl (ACCU-CHEK NANO SMARTVIEW) w/Device KIT Use to check blood glucose up to once daily as instructed  . cholecalciferol (VITAMIN D) 1000 units tablet Take 2,000 Units by mouth daily.   . Cromolyn Sodium (NASAL ALLERGY NA) Place 1 spray into the nose daily as needed (allergies).  . Cyanocobalamin (VITAMIN B 12 PO) Take 10 mLs by mouth. Taking 80m daily  . diclofenac sodium (VOLTAREN) 1 % GEL Apply 2 g topically 3 (three) times daily as needed. On affected knee joints  . Ferrous Sulfate (IRON) 325 (65 Fe) MG TABS TAKE 1 TABLET BY MOUTH TWICE DAILY WITH A MEAL  . glucose blood (ACCU-CHEK SMARTVIEW) test strip Check blood glucose up to once daily as instructed  . hydrochlorothiazide (HYDRODIURIL) 25 MG tablet Take 1 tablet by mouth once daily  . lactobacillus acidophilus (BACID) TABS tablet Take 2 tablets by mouth 2 (two) times daily. Once in the am as needed  . levocetirizine (XYZAL) 5 MG tablet Take 1 tablet (5 mg total) by mouth every evening.  .Marland Kitchenlosartan (COZAAR) 50 MG tablet Take 1 tablet by mouth  once daily  . metFORMIN (GLUCOPHAGE) 500 MG tablet Take 1 tablet (500 mg total) by mouth daily with supper.  . simvastatin (ZOCOR) 20 MG tablet TAKE 1 TABLET BY MOUTH ONCE DAILY AT 6 PM    Allergies: Ferumoxytol, Other, and Penicillins  Social History   Tobacco Use  . Smoking status: Never Smoker  . Smokeless tobacco: Never Used  Substance Use Topics  . Alcohol use: Yes    Alcohol/week: 0.0 - 1.0 standard drinks    Comment: occ. wine  . Drug use: No     Family History  Problem Relation Age of Onset  . Parkinson's disease Mother   . Heart disease Father   . Lupus Sister   . Aneurysm Son   . Lupus Son   . Breast cancer Maternal Aunt        mat great aunt    Review of Systems: A 12-system review of systems was performed and was negative except as noted in the HPI.  --------------------------------------------------------------------------------------------------  Physical Exam: BP 126/68 (BP Location: Left Arm, Patient Position: Sitting, Cuff Size: Large)   Pulse 85   Ht 5' (1.524 m)   Wt 231 lb (104.8 kg)   SpO2 98%   BMI 45.11 kg/m   General: NAD. Neck: No JVD or HJR. Lungs: Clear to auscultation bilaterally without wheezes or crackles. Heart: Regular rate and rhythm with 3/6 late peaking crescendo-decrescendo murmur.  No rubs or gallops. Abdomen: Soft, nontender, nondistended. Extremities: 1+ calf edema bilaterally with compression stockings in place.  EKG: Normal sinus rhythm with right bundle branch block.  No significant change since 06/19/2018.  Lab Results  Component Value Date   WBC 3.1 (L) 07/12/2019   HGB 10.4 (L) 07/12/2019   HCT 33.9 (L) 07/12/2019   MCV 95.2 07/12/2019   PLT 208 07/12/2019    Lab Results  Component Value Date   NA 138 07/12/2019   K 4.1 07/12/2019   CL 106 07/12/2019   CO2 24 07/12/2019   BUN 21 07/12/2019   CREATININE 1.00 07/12/2019   GLUCOSE 141 (H) 07/12/2019   ALT 24 02/09/2018    Lab Results  Component Value Date   CHOL 163 07/21/2017   HDL 68 07/21/2017   LDLCALC 77 07/21/2017   TRIG 95 07/21/2017   CHOLHDL 2.4 07/21/2017    --------------------------------------------------------------------------------------------------  ASSESSMENT AND PLAN: Aortic stenosis: Ms. Hoston reports slight progression of symptoms since last year.  It is difficult to assess her true functional capacity, as she is limited by arthritis.  However, she now reports dyspnea with tasks  around the house such as washing dishes.  Lower extremity edema has worsened.  She has also noted occasional lightheadedness.  While the symptoms are nonspecific, I am concerned they may reflect symptomatic aortic stenosis based on progression of disease noted on last week's echo.  We discussed the nature of aortic stenosis at length, including the role for valve replacement and medical therapy.  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  We discussed the work-up for aortic stenosis, including right and left heart catheterization and referral to our TAVR clinic in [place].  We have agreed to begin with gentle diuresis using furosemide 20 mg daily on top of her antihypertensive regimen.  I will have her return in 1 month to reassess her leg swelling and dyspnea.  We will likely move forward with catheterization and valve clinic referral at that  time.  Hypertension: Blood pressure well controlled today.  As above, I will add low-dose furosemide to her current regimen of HCTZ and losartan.  Morbid obesity: BMI greater than 40 with multiple comorbidities.  Ms. Bacus would benefit from weight loss through diet and exercise, though her limited mobility makes this challenging.  Follow-up: Return to clinic in 1 month.  Nelva Bush, MD 08/26/2019 6:35 AM

## 2019-08-25 NOTE — Patient Instructions (Signed)
Medication Instructions:  Your physician has recommended you make the following change in your medication:  1- START Furosemide 20 mg by mouth once a day.  *If you need a refill on your cardiac medications before your next appointment, please call your pharmacy*   Follow-Up: At [facility], you and your health needs are our priority.  As part of our continuing mission to provide you with exceptional heart care, we have created designated Provider Care Teams.  These Care Teams include your primary Cardiologist (physician) and Advanced Practice Providers (APPs -  Physician Assistants and Nurse Practitioners) who all work together to provide you with the care you need, when you need it.  We recommend signing up for the patient portal called "MyChart".  Sign up information is provided on this After Visit Summary.  MyChart is used to connect with patients for Virtual Visits (Telemedicine).  Patients are able to view lab/test results, encounter notes, upcoming appointments, etc.  Non-urgent messages can be sent to your provider as well.   To learn more about what you can do with MyChart, go to [email or web address].    Your next appointment:   1 month(s)  The format for your next appointment:   In Person  Provider:    You may see DR Harrell Gave END or one of the following Advanced Practice Providers on your designated Care Team:    Murray Hodgkins, NP  Christell Faith, PA-C  Marrianne Mood, PA-C

## 2019-08-26 ENCOUNTER — Encounter: Payer: Self-pay | Admitting: Internal Medicine

## 2019-09-05 ENCOUNTER — Ambulatory Visit
Admission: EM | Admit: 2019-09-05 | Discharge: 2019-09-05 | Disposition: A | Discharge status: Home / Self Care | Payer: Medicare Other | Attending: Emergency Medicine | Admitting: Emergency Medicine

## 2019-09-05 ENCOUNTER — Other Ambulatory Visit: Payer: Self-pay

## 2019-09-05 ENCOUNTER — Encounter: Payer: Self-pay | Admitting: Emergency Medicine

## 2019-09-05 DIAGNOSIS — B029 Zoster without complications: Secondary | ICD-10-CM

## 2019-09-05 MED ORDER — MUPIROCIN 2 % EX OINT: 1.0000 "application " | TOPICAL_OINTMENT | Freq: Two times a day (BID) | CUTANEOUS | 0 refills | Status: DC

## 2019-09-05 MED ORDER — VALACYCLOVIR HCL 1 G PO TABS: 1000.0000 mg | ORAL_TABLET | Freq: Three times a day (TID) | ORAL | 0 refills | Status: AC

## 2019-09-05 NOTE — ED Triage Notes (Signed)
Patient in today c/o rash on the left side of her neck x last night. Patient states the rash is painful.

## 2019-09-05 NOTE — Discharge Instructions (Addendum)
Valtrex will help reduce the duration and intensity of the infection, Bactroban to help prevent it from getting infected when the blisters rupture.  Keep this covered until it heals.  Does milligrams of Tylenol 3-4 times a day as needed for pain.

## 2019-09-05 NOTE — ED Provider Notes (Signed)
HPI  SUBJECTIVE:  Diana Harmon is a 74 y.o. female who presents with a painful rash on her left neck and posterior scalp starting early yesterday morning.  She reports pain and soreness in that area prior to the rash showing up.  She reports a posterior headache described as pressure.  No itching, burning.  No body aches, fevers, crusting.  She has been under an increased amount of stress recently.  She tried Benadryl without improvement in her symptoms.  There are no aggravating or alleviating factors.  She has a past medical history of chickenpox, hypertension, chronic kidney disease stage II, diabetes.  PMD:[name], Alexander J, DO    Past Medical History:  Diagnosis Date  . Anemia   . Aortic stenosis   . Benign tumor   . Diabetes mellitus without complication ([facility])   . Hypertension   . Nausea   . Patient is [name]   . Thyroid disease     Past Surgical History:  Procedure Laterality Date  . COLONOSCOPY    . COLONOSCOPY WITH PROPOFOL N/A 03/13/2015   Procedure: COLONOSCOPY WITH PROPOFOL;  Surgeon: Robert T Elliott, MD;  Location: [facility] ENDOSCOPY;  Service: Endoscopy;  Laterality: N/A;  . ESOPHAGOGASTRODUODENOSCOPY N/A 03/13/2015   Procedure: ESOPHAGOGASTRODUODENOSCOPY (EGD);  Surgeon: Robert T Elliott, MD;  Location: [facility] ENDOSCOPY;  Service: Endoscopy;  Laterality: N/A;  . THYROIDECTOMY    . tumor removed      Family History  Problem Relation Age of Onset  . Parkinson's disease Mother   . Heart disease Father   . Lupus Sister   . Aneurysm Son   . Lupus Son   . Breast cancer Maternal Aunt        mat great aunt    Social History   Tobacco Use  . Smoking status: Former Smoker    Years: 20.00    Types: Cigarettes    Quit date: 09/05/1979    Years since quitting: 40.0  . Smokeless tobacco: Never Used  Substance Use Topics  . Alcohol use: Yes    Alcohol/week: 0.0 - 1.0 standard drinks    Comment: occ. wine  . Drug use: No    No current  facility-administered medications for this encounter.  Current Outpatient Medications:  .  acetaminophen (TYLENOL) 500 MG tablet, Take 1,000 mg by mouth every 6 (six) hours as needed for moderate pain., Disp: , Rfl:  .  aspirin EC 81 MG tablet, Take 1 tablet (81 mg total) by mouth daily., Disp: 100 tablet, Rfl: 12 .  baclofen (LIORESAL) 10 MG tablet, TAKE 1 TO 2 TABLETS BY MOUTH TWICE DAILY AS NEEDED FOR MUSCLE SPASM, Disp: 60 tablet, Rfl: 2 .  Blood Glucose Monitoring Suppl (ACCU-CHEK NANO SMARTVIEW) w/Device KIT, Use to check blood glucose up to once daily as instructed, Disp: 1 kit, Rfl: 0 .  cholecalciferol (VITAMIN D) 1000 units tablet, Take 2,000 Units by mouth daily. , Disp: , Rfl:  .  Cyanocobalamin (VITAMIN B 12 PO), Take 10 mLs by mouth. Taking 10ml daily, Disp: , Rfl:  .  Ferrous Sulfate (IRON) 325 (65 Fe) MG TABS, TAKE 1 TABLET BY MOUTH TWICE DAILY WITH A MEAL, Disp: 180 each, Rfl: 1 .  furosemide (LASIX) 20 MG tablet, Take 1 tablet (20 mg total) by mouth daily., Disp: 90 tablet, Rfl: 1 .  glucose blood (ACCU-CHEK SMARTVIEW) test strip, Check blood glucose up to once daily as instructed, Disp: 100 each, Rfl: 5 .  hydrochlorothiazide (HYDRODIURIL) 25 MG tablet, Take   1 tablet by mouth once daily, Disp: 90 tablet, Rfl: 3 .  lactobacillus acidophilus (BACID) TABS tablet, Take 2 tablets by mouth 2 (two) times daily. Once in the am as needed, Disp: , Rfl:  .  levocetirizine (XYZAL) 5 MG tablet, Take 1 tablet (5 mg total) by mouth every evening., Disp: 90 tablet, Rfl: 3 .  losartan (COZAAR) 50 MG tablet, Take 1 tablet by mouth once daily, Disp: 90 tablet, Rfl: 1 .  metFORMIN (GLUCOPHAGE) 500 MG tablet, Take 1 tablet (500 mg total) by mouth daily with supper., Disp: 90 tablet, Rfl: 1 .  simvastatin (ZOCOR) 20 MG tablet, TAKE 1 TABLET BY MOUTH ONCE DAILY AT 6 PM, Disp: 90 tablet, Rfl: 1 .  mupirocin ointment (BACTROBAN) 2 %, Apply 1 application topically 2 (two) times daily., Disp: 22 g, Rfl:  0 .  valACYclovir (VALTREX) 1000 MG tablet, Take 1 tablet (1,000 mg total) by mouth 3 (three) times daily for 7 days., Disp: 21 tablet, Rfl: 0  Allergies  Allergen Reactions  . Ferumoxytol Swelling    Swelling in neck  . Other     Other reaction(s): Other (See Comments) She refuses blood products-Jehovah witness  . Penicillins Rash, Swelling and Hives     ROS  As noted in HPI.   Physical Exam  BP 135/70 (BP Location: Right Arm)   Pulse 93   Temp 98.4 F (36.9 C) (Oral)   Resp 18   Ht 5' (1.524 m)   Wt 104.3 kg   SpO2 99%   BMI 44.92 kg/m   Constitutional: Well developed, well nourished, no acute distress Eyes:  EOMI, conjunctiva normal bilaterally HENT: Normocephalic, atraumatic,mucus membranes moist Respiratory: Normal inspiratory effort Cardiovascular: Normal rate GI: nondistended skin: Positive tender vesicular rash grouped together on erythematous plaques in the C2/C3 dermatomal distribution on the left neck..  No crusting.  No rash over her entire arm.        Musculoskeletal: no deformities Neurologic: Alert & oriented x 3, no focal neuro deficits Psychiatric: Speech and behavior appropriate   ED Course   Medications - No data to display  No orders of the defined types were placed in this encounter.   No results found for this or any previous visit (from the past 24 hour(s)). No results found.  ED Clinical Impression  1. Herpes zoster without complication      ED Assessment/Plan  Patient with shingles.  Home with Valtrex, Bactroban.  Last BUN/creatinine on 07/12/2019 were normal.  May take 1000 mg Tylenol every 6-8 hours as needed for pain.  Follow-up with PMD in 5 to 7 days, to the ER if she gets worse, if it disseminates or travels to her eye.  Discussed  MDM, treatment plan, and plan for follow-up with patient and daughter. Discussed sn/sx that should prompt return to the ED. they agree with plan.   Meds ordered this encounter   Medications  . valACYclovir (VALTREX) 1000 MG tablet    Sig: Take 1 tablet (1,000 mg total) by mouth 3 (three) times daily for 7 days.    Dispense:  21 tablet    Refill:  0  . mupirocin ointment (BACTROBAN) 2 %    Sig: Apply 1 application topically 2 (two) times daily.    Dispense:  22 g    Refill:  0    *This clinic note was created using Dragon dictation software. Therefore, there may be occasional mistakes despite careful proofreading.   ?    Mortenson, Ashley,   MD 09/05/19 1538  

## 2019-09-13 ENCOUNTER — Telehealth: Payer: Medicare Other | Admitting: General Practice

## 2019-09-13 ENCOUNTER — Ambulatory Visit (INDEPENDENT_AMBULATORY_CARE_PROVIDER_SITE_OTHER): Payer: Medicare Other | Admitting: General Practice

## 2019-09-13 DIAGNOSIS — I10 Essential (primary) hypertension: Secondary | ICD-10-CM

## 2019-09-13 DIAGNOSIS — E785 Hyperlipidemia, unspecified: Secondary | ICD-10-CM

## 2019-09-13 DIAGNOSIS — E1169 Type 2 diabetes mellitus with other specified complication: Secondary | ICD-10-CM

## 2019-09-13 DIAGNOSIS — B028 Zoster with other complications: Secondary | ICD-10-CM

## 2019-09-13 DIAGNOSIS — N182 Chronic kidney disease, stage 2 (mild): Secondary | ICD-10-CM

## 2019-09-13 NOTE — Patient Instructions (Signed)
Visit Information  Goals Addressed            This Visit's Progress   . RNCM: Pt-"I thought a spider bit me, but I have shingles" (pt-stated)       CARE PLAN ENTRY (see longitudinal plan of care for additional care plan information)  Current Barriers:  Marland Kitchen Knowledge Deficits related to etiology and pathophysiology of shingles . Care Coordination needs related to new diagnosis of shingles  in a patient with several chronic conditions (disease states) . Chronic Disease Management support and education needs related to shingles and management of shingles  Nurse Case Manager Clinical Goal(s):  Marland Kitchen Over the next 120 days, patient will verbalize understanding of plan for effectively treating shingles without any complications . Over the next 120 days, patient will work with [facility] and pcp to address needs related to Shingles outbreak on her shoulder, neck, ear and hairline . Over the next 30 days, patient will attend all scheduled medical appointments: front office staff to contact the patient to set up an appointment to see pcp . Over the next 120 days, patient will demonstrate improved adherence to prescribed treatment plan for shingles as evidenced byno further outbreaks   Interventions:  . Inter-disciplinary care team collaboration (see longitudinal plan of care) . Evaluation of current treatment plan related to shingles and patient's adherence to plan as established by provider. . Advised patient to call the pcp for worsening condition or spread of shingles  . Provided education to patient re: to do meticulous hand washing, not to touch eyes, and to see the provider for evaluation and further treatment of shingles. The patient states some areas are crusted over.  Education on how shingles spread and being in the contagious state when there are blisters present. The patient states she is taking Aciclovir as directed but is almost completley finished. The patient is taking Tylenol every 4 hours to  help with pain relief.  . Reviewed medications with patient and discussed The patient is taking Aciclovir and Tylenol as directed  . Collaborated with pcp regarding outbreak of shingles and visit recently to the ER . Discussed plans with patient for ongoing care management follow up and provided patient with direct contact information for care management team . Reviewed scheduled/upcoming provider appointments including: front office staff to call and get an appointment for the patient to come in and see pcp.   Patient Self Care Activities:  . Patient verbalizes understanding of plan to come in to see the pcp post ER visit for evaluation and follow up . Self administers medications as prescribed . Calls provider office for new concerns or questions . Unable to independently manage outbreak of shingles   Initial goal documentation     . RNCM: Pt-"I usually take my blood pressure daily" (pt-stated)       CARE PLAN ENTRY (see longtitudinal plan of care for additional care plan information)  Current Barriers:  . Chronic Disease Management support, education, and care coordination needs related to HTN, HLD, DMII, and CKD Stage 2  Clinical Goal(s) related to HTN, HLD, DMII, and CKD Stage 2 :  Over the next 120 days, patient will:  . Work with the care management team to address educational, disease management, and care coordination needs  . Begin or continue self health monitoring activities as directed today Measure and record cbg (blood glucose) 3 times a week (last hemoglobin A1C 6.0), Measure and record blood pressure 3 times per week, and adhere to a heart  healthy/ADA diet . Call provider office for new or worsened signs and symptoms Blood glucose findings outside established parameters, Blood pressure findings outside established parameters, Oxygen saturation lower than established parameter, Shortness of breath, and New or worsened symptom related to HLD, CKD2,  and other chronic  conditions . Call care management team with questions or concerns . Verbalize basic understanding of patient centered plan of care established today  Interventions related to HTN, HLD, DMII, and CKD Stage 2 :  . Evaluation of current treatment plans and patient's adherence to plan as established by provider . Assessed patient understanding of disease states.  The patient has a good understanding of her conditions. Has an appointment on the 26th of May to talk to the cardiologist about next steps with a possible valve replacement. The patient had an ECHO on 08-19-2019 and her EF was 60-65%.  Explained to the patient what the EF % was.  The patient takes her blood pressure regularly. Values given for when she had the house calls visit on 09-02-2019 . Assessed patient's education and care coordination needs.  The verbalized understanding of education given today. The RNCM will contact the front office staff to establish and appointment for the patient to be seen by pcp.  . Provided disease specific education to patient.  Education and support on cardiac function and what the EF % means from her ECHO.  Nash Dimmer with appropriate clinical care team members regarding patient needs.  Patient denies any needs from the LCSW or pharmacist. Will continue to monitor. She knows CCM team is available to assist.   Patient Self Care Activities related to HTN, HLD, DMII, and CKD Stage 2 :  . Patient is unable to independently self-manage chronic health conditions  Initial goal documentation        Ms. Bellot was given information about Chronic Care Management services today including:  1. CCM service includes personalized support from designated clinical staff supervised by her physician, including individualized plan of care and coordination with other care providers 2. 24/7 contact phone numbers for assistance for urgent and routine care needs. 3. Service will only be billed when office clinical staff  spend 20 minutes or more in a month to coordinate care. 4. Only one practitioner may furnish and bill the service in a calendar month. 5. The patient may stop CCM services at any time (effective at the end of the month) by phone call to the office staff. 6. The patient will be responsible for cost sharing (co-pay) of up to 20% of the service fee (after annual deductible is met).  Patient agreed to services and verbal consent obtained.   Patient verbalizes understanding of instructions provided today.   The care management team will reach out to the patient again over the next 30 to 60 days.   Noreene Larsson RN, MSN, [place] Mobile: [phone]

## 2019-09-13 NOTE — Chronic Care Management (AMB) (Signed)
Chronic Care Management   Initial Visit Note  09/13/2019 Name: Diana Harmon MRN: [id number] DOB: 07-Oct-1945  Referred by: [name], DO Reason for referral : Chronic Care Management (Initial Outreach: RNCM Chronici Disease Management and Care Coordination Needs)   Diana Harmon is a 74 y.o. year old female who is a primary care patient of [name], DO. The CCM team was consulted for assistance with chronic disease management and care coordination needs related to HTN, HLD, DMII,  CKD Stage 2, and new onset of shingles to shoulder, neck, ear and hair line.  Review of patient status, including review of consultants reports, relevant laboratory and other test results, and collaboration with appropriate care team members and the patient's provider was performed as part of comprehensive patient evaluation and provision of chronic care management services.    SDOH (Social Determinants of Health) assessments performed: Yes See Care Plan activities for detailed interventions related to SDOH  SDOH Interventions     Most Recent Value  SDOH Interventions  SDOH Interventions for the Following Domains  Physical Activity  Physical Activity Interventions  Other (Comments) [does not do any structured activity]  Alcohol Brief Interventions/Follow-up  AUDIT Score <7 follow-up not indicated       Medications: Outpatient Encounter Medications as of 09/13/2019  Medication Sig Note  . acetaminophen (TYLENOL) 500 MG tablet Take 1,000 mg by mouth every 6 (six) hours as needed for moderate pain.   Marland Kitchen aspirin EC 81 MG tablet Take 1 tablet (81 mg total) by mouth daily.   . baclofen (LIORESAL) 10 MG tablet TAKE 1 TO 2 TABLETS BY MOUTH TWICE DAILY AS NEEDED FOR MUSCLE SPASM   . Blood Glucose Monitoring Suppl (ACCU-CHEK NANO SMARTVIEW) w/Device KIT Use to check blood glucose up to once daily as instructed   . cholecalciferol (VITAMIN D) 1000 units tablet Take 2,000  Units by mouth daily.    . Cyanocobalamin (VITAMIN B 12 PO) Take 10 mLs by mouth. Taking 70m daily   . Ferrous Sulfate (IRON) 325 (65 Fe) MG TABS TAKE 1 TABLET BY MOUTH TWICE DAILY WITH A MEAL   . furosemide (LASIX) 20 MG tablet Take 1 tablet (20 mg total) by mouth daily.   .Marland Kitchenglucose blood (ACCU-CHEK SMARTVIEW) test strip Check blood glucose up to once daily as instructed   . hydrochlorothiazide (HYDRODIURIL) 25 MG tablet Take 1 tablet by mouth once daily   . lactobacillus acidophilus (BACID) TABS tablet Take 2 tablets by mouth 2 (two) times daily. Once in the am as needed 10/17/2015: PRN  . levocetirizine (XYZAL) 5 MG tablet Take 1 tablet (5 mg total) by mouth every evening.   .Marland Kitchenlosartan (COZAAR) 50 MG tablet Take 1 tablet by mouth once daily   . metFORMIN (GLUCOPHAGE) 500 MG tablet Take 1 tablet (500 mg total) by mouth daily with supper.   . mupirocin ointment (BACTROBAN) 2 % Apply 1 application topically 2 (two) times daily.   . simvastatin (ZOCOR) 20 MG tablet TAKE 1 TABLET BY MOUTH ONCE DAILY AT 6 PM   . [DISCONTINUED] Cromolyn Sodium (NASAL ALLERGY NA) Place 1 spray into the nose daily as needed (allergies).    No facility-administered encounter medications on file as of 09/13/2019.     Objective:  BP Readings from Last 3 Encounters:  09/02/19 140/80  09/05/19 135/70  08/25/19 126/68    Goals Addressed            This Visit's Progress   . RNCM:  Pt-"I thought a spider bit me, but I have shingles" (pt-stated)       CARE PLAN ENTRY (see longitudinal plan of care for additional care plan information)  Current Barriers:  Marland Kitchen Knowledge Deficits related to etiology and pathophysiology of shingles . Care Coordination needs related to new diagnosis of shingles  in a patient with several chronic conditions (disease states) . Chronic Disease Management support and education needs related to shingles and management of shingles  Nurse Case Manager Clinical Goal(s):  Marland Kitchen Over the next 120  days, patient will verbalize understanding of plan for effectively treating shingles without any complications . Over the next 120 days, patient will work with [facility] and pcp to address needs related to Shingles outbreak on her shoulder, neck, ear and hairline . Over the next 30 days, patient will attend all scheduled medical appointments: front office staff to contact the patient to set up an appointment to see pcp . Over the next 120 days, patient will demonstrate improved adherence to prescribed treatment plan for shingles as evidenced byno further outbreaks   Interventions:  . Inter-disciplinary care team collaboration (see longitudinal plan of care) . Evaluation of current treatment plan related to shingles and patient's adherence to plan as established by provider. . Advised patient to call the pcp for worsening condition or spread of shingles  . Provided education to patient re: to do meticulous hand washing, not to touch eyes, and to see the provider for evaluation and further treatment of shingles. The patient states some areas are crusted over.  Education on how shingles spread and being in the contagious state when there are blisters present. The patient states she is taking Aciclovir as directed but is almost completley finished. The patient is taking Tylenol every 4 hours to help with pain relief.  . Reviewed medications with patient and discussed The patient is taking Aciclovir and Tylenol as directed  . Collaborated with pcp regarding outbreak of shingles and visit recently to the ER . Discussed plans with patient for ongoing care management follow up and provided patient with direct contact information for care management team . Reviewed scheduled/upcoming provider appointments including: front office staff to call and get an appointment for the patient to come in and see pcp.   Patient Self Care Activities:  . Patient verbalizes understanding of plan to come in to see the pcp post ER  visit for evaluation and follow up . Self administers medications as prescribed . Calls provider office for new concerns or questions . Unable to independently manage outbreak of shingles   Initial goal documentation     . RNCM: Pt-"I usually take my blood pressure daily" (pt-stated)       CARE PLAN ENTRY (see longtitudinal plan of care for additional care plan information)  Current Barriers:  . Chronic Disease Management support, education, and care coordination needs related to HTN, HLD, DMII, and CKD Stage 2  Clinical Goal(s) related to HTN, HLD, DMII, and CKD Stage 2 :  Over the next 120 days, patient will:  . Work with the care management team to address educational, disease management, and care coordination needs  . Begin or continue self health monitoring activities as directed today Measure and record cbg (blood glucose) 3 times a week (last hemoglobin A1C 6.0), Measure and record blood pressure 3 times per week, and adhere to a heart healthy/ADA diet . Call provider office for new or worsened signs and symptoms Blood glucose findings outside established parameters, Blood pressure findings  outside established parameters, Oxygen saturation lower than established parameter, Shortness of breath, and New or worsened symptom related to HLD, CKD2,  and other chronic conditions . Call care management team with questions or concerns . Verbalize basic understanding of patient centered plan of care established today  Interventions related to HTN, HLD, DMII, and CKD Stage 2 :  . Evaluation of current treatment plans and patient's adherence to plan as established by provider . Assessed patient understanding of disease states.  The patient has a good understanding of her conditions. Has an appointment on the 26th of May to talk to the cardiologist about next steps with a possible valve replacement. The patient had an ECHO on 08-19-2019 and her EF was 60-65%.  Explained to the patient what the EF %  was.  The patient takes her blood pressure regularly. Values given for when she had the house calls visit on 09-02-2019 . Assessed patient's education and care coordination needs.  The verbalized understanding of education given today. The RNCM will contact the front office staff to establish and appointment for the patient to be seen by pcp.  . Provided disease specific education to patient.  Education and support on cardiac function and what the EF % means from her ECHO.  Nash Dimmer with appropriate clinical care team members regarding patient needs.  Patient denies any needs from the LCSW or pharmacist. Will continue to monitor. She knows CCM team is available to assist.   Patient Self Care Activities related to HTN, HLD, DMII, and CKD Stage 2 :  . Patient is unable to independently self-manage chronic health conditions  Initial goal documentation         Diana Harmon was given information about Chronic Care Management services today including:  1. CCM service includes personalized support from designated clinical staff supervised by her physician, including individualized plan of care and coordination with other care providers 2. 24/7 contact phone numbers for assistance for urgent and routine care needs. 3. Service will only be billed when office clinical staff spend 20 minutes or more in a month to coordinate care. 4. Only one practitioner may furnish and bill the service in a calendar month. 5. The patient may stop CCM services at any time (effective at the end of the month) by phone call to the office staff. 6. The patient will be responsible for cost sharing (co-pay) of up to 20% of the service fee (after annual deductible is met).  Patient agreed to services and verbal consent obtained.   Plan:   The care management team will reach out to the patient again over the next 30 to 60  days.   Noreene Larsson RN, MSN, [place] Mobile: [phone]

## 2019-09-14 ENCOUNTER — Encounter: Payer: Self-pay | Admitting: Family Medicine

## 2019-09-14 ENCOUNTER — Other Ambulatory Visit: Payer: Self-pay

## 2019-09-14 ENCOUNTER — Ambulatory Visit (INDEPENDENT_AMBULATORY_CARE_PROVIDER_SITE_OTHER): Payer: Medicare Other | Admitting: Family Medicine

## 2019-09-14 VITALS — BP 145/57 | HR 91 | Temp 97.3°F | Resp 16 | Ht 60.0 in | Wt 225.6 lb

## 2019-09-14 DIAGNOSIS — B0229 Other postherpetic nervous system involvement: Secondary | ICD-10-CM | POA: Diagnosis not present

## 2019-09-14 DIAGNOSIS — B029 Zoster without complications: Secondary | ICD-10-CM

## 2019-09-14 MED ORDER — GABAPENTIN 100 MG PO CAPS: ORAL_CAPSULE | ORAL | 1 refills | Status: DC

## 2019-09-14 MED ORDER — PREDNISONE 50 MG PO TABS: 50.0000 mg | ORAL_TABLET | Freq: Every day | ORAL | 0 refills | Status: DC

## 2019-09-14 NOTE — Progress Notes (Signed)
Subjective:    Patient ID: Diana Harmon, female    DOB: 24-Jul-1945, 74 y.o.   MRN: [id number]  Diana Harmon is a 74 y.o. female presenting on 09/14/2019 for Herpes Zoster (Right side onset over 1 week)  Daughter here with patient.  HPI   Shingles, acute, rash, Left neck and face Here for shingles episode recently. Original onset 10 days ago, on May 1 onset with rash on Left side face and neck. Next day 5/2 developed some nerve pain and itching with rash persistent. Went to [place], seen on 5/2, treated with Valtrex 1g TID x 7 days and other conservative measures, given mupirocin ointment PRN to prevent bacterial infection. No prior shingles flare. Has not had shingles vaccine. No sick contacts. No other obvious triggers recently. - Today reports rash is crusting and scabbing and improving. However she primarily is here due to persistent pain in the site of the rash on Left neck and face. Also has one area at her Left ear but not inside. - She has normal hearing. Denies any hearing loss, vertigo, dizziness, tinnitus, facial paralysis or facial droop  Additional topic Repots abnormal spot on her bottom, gluteal cleft area of discomfort skin sore spot worse if laying on it for long period of time.  Depression screen [facility] 2/9 09/14/2019 09/13/2019 04/20/2019  Decreased Interest 0 0 0  Down, Depressed, Hopeless 0 0 0  PHQ - 2 Score 0 0 0    Social History   Tobacco Use  . Smoking status: Former Smoker    Years: 20.00    Types: Cigarettes    Quit date: 09/05/1979    Years since quitting: 40.0  . Smokeless tobacco: Former Network engineer Use Topics  . Alcohol use: Yes    Alcohol/week: 0.0 - 1.0 standard drinks    Comment: occ. wine  . Drug use: No    Review of Systems Per HPI unless specifically indicated above     Objective:    BP (!) 145/57   Pulse 91   Temp (!) 97.3 F (36.3 C) (Temporal)   Resp 16   Ht 5' (1.524 m)   Wt 225 lb 9.6 oz  (102.3 kg)   SpO2 100%   BMI 44.06 kg/m   Wt Readings from Last 3 Encounters:  09/14/19 225 lb 9.6 oz (102.3 kg)  09/02/19 229 lb (103.9 kg)  09/05/19 230 lb (104.3 kg)    Physical Exam Vitals and nursing note reviewed.  Constitutional:      General: She is not in acute distress.    Appearance: She is well-developed. She is not diaphoretic.     Comments: Well-appearing, comfortable, cooperative  HENT:     Head: Normocephalic and atraumatic.     Comments: Hearing is grossly intact bilateral    Right Ear: Tympanic membrane, ear canal and external ear normal.     Left Ear: Tympanic membrane, ear canal and external ear normal.  Eyes:     General:        Right eye: No discharge.        Left eye: No discharge.     Conjunctiva/sclera: Conjunctivae normal.  Cardiovascular:     Rate and Rhythm: Normal rate.  Pulmonary:     Effort: Pulmonary effort is normal.  Skin:    General: Skin is warm and dry.     Findings: Rash (Left sided neck and some areas on face cheek pre-auricular area scattered patches of drying scabs  resolving shingles rash. No ocular involvement and no involement inside external ear canal) present. No erythema.     Comments: Daughter assisted with exam today. Superior aspect of gluteal cleft showed one distinct location of darkening of skin with some thickening as well, non tender, no induration or erythema or ulceration or skin breakdown.  Neurological:     General: No focal deficit present.     Mental Status: She is alert and oriented to person, place, and time. Mental status is at baseline.     Cranial Nerves: No cranial nerve deficit.     Sensory: No sensory deficit.  Psychiatric:        Behavior: Behavior normal.     Comments: Well groomed, good eye contact, normal speech and thoughts    Results for orders placed or performed in visit on 07/12/19  Haptoglobin  Result Value Ref Range   Haptoglobin 180 42 - 346 mg/dL  Reticulocytes  Result Value Ref Range    Retic Ct Pct 1.1 0.4 - 3.1 %   RBC. 3.58 (L) 3.87 - 5.11 MIL/uL   Retic Count, Absolute 38.7 19.0 - 186.0 K/uL   Immature Retic Fract 9.2 2.3 - 15.9 %  Vitamin B12  Result Value Ref Range   Vitamin B-12 2,417 (H) 180 - 914 pg/mL  Basic metabolic panel  Result Value Ref Range   Sodium 138 135 - 145 mmol/L   Potassium 4.1 3.5 - 5.1 mmol/L   Chloride 106 98 - 111 mmol/L   CO2 24 22 - 32 mmol/L   Glucose, Bld 141 (H) 70 - 99 mg/dL   BUN 21 8 - 23 mg/dL   Creatinine, Ser 1.00 0.44 - 1.00 mg/dL   Calcium 9.2 8.9 - 10.3 mg/dL   GFR calc non Af Amer 56 (L) >60 mL/min   GFR calc Af Amer >60 >60 mL/min   Anion gap 8 5 - 15  CBC with Differential  Result Value Ref Range   WBC 3.1 (L) 4.0 - 10.5 K/uL   RBC 3.56 (L) 3.87 - 5.11 MIL/uL   Hemoglobin 10.4 (L) 12.0 - 15.0 g/dL   HCT 33.9 (L) 36.0 - 46.0 %   MCV 95.2 80.0 - 100.0 fL   MCH 29.2 26.0 - 34.0 pg   MCHC 30.7 30.0 - 36.0 g/dL   RDW 13.8 11.5 - 15.5 %   Platelets 208 150 - 400 K/uL   nRBC 0.0 0.0 - 0.2 %   Neutrophils Relative % 38 %   Neutro Abs 1.2 (L) 1.7 - 7.7 K/uL   Lymphocytes Relative 43 %   Lymphs Abs 1.3 0.7 - 4.0 K/uL   Monocytes Relative 14 %   Monocytes Absolute 0.4 0.1 - 1.0 K/uL   Eosinophils Relative 4 %   Eosinophils Absolute 0.1 0.0 - 0.5 K/uL   Basophils Relative 1 %   Basophils Absolute 0.0 0.0 - 0.1 K/uL   Immature Granulocytes 0 %   Abs Immature Granulocytes 0.01 0.00 - 0.07 K/uL      Assessment & Plan:   Problem List Items Addressed This Visit    None    Visit Diagnoses    Herpes zoster without complication    -  Primary   Post herpetic neuralgia       Relevant Medications   predniSONE (DELTASONE) 50 MG tablet   gabapentin (NEURONTIN) 100 MG capsule      Clinically consistent with new acute shingles herpes zoster outbreak onset 10 days ago, with mild to  moderate neuropathic pain post herpetic neuralgia now that rash resolving, completed valtrex course - Never received zoster vaccine - No  evidence of complications, such as ocular involvement Not Ramsay Hunt Syndrome (not otologic complication - no vertigo, dizziness, hearing loss, or facial nerve paralysis)  Plan: 1. Finished Acyclovir - Discussed post herpetic neuralgia symptoms, management, duration complications - Start Prednisone 50mg  daily x 5 days, empiric coverage for acute inflammation caused by Shingles and also at risk for concern of possible otological complication however NOT evident on exam. 2. Start Gabapentin 100mg  caps titration dose over 1-2 weeks up to 300mg  at night or 100-300 TID can take for 4-6 weeks and determine course if still needed 3. Future consider other meds such as TCA, SNRI, Tramadol  Future can get Shingrix vaccine >6 weeks after shingles has resolved.   Meds ordered this encounter  Medications  . predniSONE (DELTASONE) 50 MG tablet    Sig: Take 1 tablet (50 mg total) by mouth daily with breakfast.    Dispense:  5 tablet    Refill:  0  . gabapentin (NEURONTIN) 100 MG capsule    Sig: Start 1 capsule daily, increase by 1 cap every 2-3 days as tolerated up to 3 times a day, or may take 3 at once in evening.    Dispense:  90 capsule    Refill:  1      Follow up plan: Return if symptoms worsen or fail to improve, for shingles as needed.   [name], [facility] 09/14/2019, 3:19 PM

## 2019-09-14 NOTE — Patient Instructions (Signed)
Thank you for coming to the office today.  Area in gluteal cleft is a small sore spot that is not infected, goal to try to off load pressure, can use a foam pad and use the antibiotic ointment as needed or barrier cream.  Now that finished the shingles viral medicine. We will treat the nerve pain that is lingering.  Start Prednisone 50mg  daily with food.  Start Gabapentin 100mg  capsules, take at night for 2-3 nights only, and then increase to 2 times a day for a few days, and then may increase to 3 times a day, it may make you drowsy, if helps significantly at night only, then you can increase instead to 3 capsules at night, instead of 3 times a day - In the future if needed, we can significantly increase the dose if tolerated well, some common doses are 300mg  three times a day up to 600mg  three times a day, usually it takes several weeks or months to get to higher doses   Blister and Rash Care  Take a cool bath or apply cool compresses to the area of the rash or blisters as directed by your health care provider. This may help with pain and itching.  Keep your rash covered with a loose bandage (dressing). Wear loose-fitting clothing to help ease the pain of material rubbing against the rash.  Keep your rash and blisters clean with mild soap and cool water or as directed by your health care provider.  Check your rash every day for signs of infection. These include redness, swelling, and pain that lasts or increases.  Do not pick your blisters.  Do not scratch your rash.  As mentioned, some patients experience "Post-Herpetic Neuralgia" or a persistent sensation or feeling of "nerve irritation or pain" that can last for days to weeks to months or longer in this same area.  You may try the nerve medicine to help ease these symptoms now or in the future if it is needed. Try Gabapentin 100mg  capsules, take at night for 2-3 nights only, and then increase to 2 times a day for a few days, and then  may increase to 3 times a day, it may make you drowsy, if helps significantly at night only, then you can increase instead to 3 capsules at night, instead of 3 times a day - In the future if needed, we can significantly increase the dose if tolerated well, some common doses are 300mg  three times a day up to 600mg  three times a day, usually it takes several weeks or months to get to higher doses  It is contagious to patients who have never had Chicken Pox - or someone who is pregnant (unborn baby is at risk) - or elderly with weakened immune system. Try to avoid direct close contact with others on the skin if you have active blisters. Once the rash dries up and heals, it is less of a concern.  You may get the Shingles vaccine (Shingrix) approximately 6 weeks after the rash and episode is resolved. It is a two dose series of vaccines. You may get this at the pharmacy, as Medicare does not cover this vaccine at our office.   Contact your doctor if:  Your pain is not relieved with prescribed medicines.  Your pain does not get better after the rash heals.  Your rash looks infected. Signs of infection include redness, swelling, and pain that lasts or increases.  Please seek more immediate medical attention at the [facility] Emergency Department  IF  The rash is on your face or nose.  You have facial pain, pain around your eye area, or loss of feeling on one side of your face.  You have ear pain or you have ringing in your ear.  You have loss of taste.  Your condition gets worse.    ------------------------------------------------------------------------------------------------------- Please review the additional information below about Shingles, if you have additional concerns.  Shingles Shingles, which is also known as herpes zoster, is an infection that causes a painful skin rash and fluid-filled blisters. Shingles is not related to genital herpes, which is a sexually transmitted  infection. Shingles only develops in people who:  Have had chickenpox.  Have received the chickenpox vaccine. (This is rare.)  What are the causes? Shingles is caused by varicella-zoster virus (VZV). This is the same virus that causes chickenpox. After exposure to VZV, the virus stays in the body in an inactive (dormant) state. Shingles develops if the virus reactivates. This can happen many years after the initial exposure to VZV. It is not known what causes this virus to reactivate. What increases the risk? People who have had chickenpox or received the chickenpox vaccine are at risk for shingles. Infection is more common in people who:  Are older than age 11.  Have a weakened defense (immune) system, such as those with HIV, AIDS, or cancer.  Are taking medicines that weaken the immune system, such as transplant medicines.  Are under great stress.  What are the signs or symptoms? Early symptoms of this condition include itching, tingling, and pain in an area on your skin. Pain may be described as burning, stabbing, or throbbing. A few days or weeks after symptoms start, a painful red rash appears, usually on one side of the body in a bandlike or beltlike pattern. The rash eventually turns into fluid-filled blisters that break open, scab over, and dry up in about 2-3 weeks. At any time during the infection, you may also develop:  A fever.  Chills.  A headache.  An upset stomach.  How is this diagnosed? This condition is diagnosed with a skin exam. Sometimes, skin or fluid samples are taken from the blisters before a diagnosis is made. These samples are examined under a microscope or sent to a lab for testing. How is this treated? There is no specific cure for this condition. Your health care provider will probably prescribe medicines to help you manage pain, recover more quickly, and avoid long-term problems. Medicines may include:  Antiviral drugs.  Anti-inflammatory  drugs.  Pain medicines.  If the area involved is on your face, you may be referred to a specialist, such as an eye doctor (ophthalmologist) or an ear, nose, and throat (ENT) doctor to help you avoid eye problems, chronic pain, or disability. Follow these instructions at home: Medicines  Take medicines only as directed by your health care provider.  Apply an anti-itch or numbing cream to the affected area as directed by your health care provider. General instructions  Rest as directed by your health care provider.  Keep all follow-up visits as directed by your health care provider. This is important.  Until your blisters scab over, your infection can cause chickenpox in people who have never had it or been vaccinated against it. To prevent this from happening, avoid contact with other people, especially: ? Babies. ? Pregnant women. ? Children who have eczema. ? Elderly people who have transplants. ? People who have chronic illnesses, such as leukemia or AIDS.  This information is not intended to replace advice given to you by your health care provider. Make sure you discuss any questions you have with your health care provider. Document Released: 04/22/2005 Document Revised: 12/17/2015 Document Reviewed: 03/03/2014 Elsevier Interactive Patient Education  2018 [employer].      Please schedule a Follow-up Appointment to: Return if symptoms worsen or fail to improve, for shingles as needed.  If you have any other questions or concerns, please feel free to call the office or send a message through [place]. You may also schedule an earlier appointment if necessary.  Additionally, you may be receiving a survey about your experience at our office within a few days to 1 week by e-mail or mail. We value your feedback.  [name], DO [place]

## 2019-09-17 ENCOUNTER — Other Ambulatory Visit: Payer: Self-pay | Admitting: Family Medicine

## 2019-09-17 DIAGNOSIS — E7800 Pure hypercholesterolemia, unspecified: Secondary | ICD-10-CM

## 2019-09-17 MED ORDER — SIMVASTATIN 20 MG PO TABS: ORAL_TABLET | ORAL | 0 refills | Status: DC

## 2019-09-17 NOTE — Telephone Encounter (Signed)
Pt request refill  simvastatin (ZOCOR) 20 MG tablet   [place], [place] Phone:  [phone]  Fax:  [phone]

## 2019-09-17 NOTE — Telephone Encounter (Signed)
Labs are overdue- need to be drawn at next visit

## 2019-09-20 ENCOUNTER — Ambulatory Visit: Payer: Self-pay | Admitting: *Deleted

## 2019-09-20 NOTE — Telephone Encounter (Signed)
Summary: vaccine question.   Patient's daughter requesting to speak with nurse regarding covid-19 vaccine. She specifically inquired how long patient needs to wait between a diagnosis of shingles until she is able to have first covid-19 vaccine.      Call to patient's daughter: Always follow PCP advise for getting COVID vaccine. General information: Advised get treatment for shingles. Once rash has no more blisters- wait 2 weeks before getting COVID vaccine.She understands advisement.  Reason for Disposition . Shingles, questions about . COVID-19 vaccine, Frequently Asked Questions (FAQs)  Protocols used: SHINGLES-A-AH, CORONAVIRUS (COVID-19) VACCINE QUESTIONS AND REACTIONS-A-AH

## 2019-09-24 ENCOUNTER — Ambulatory Visit: Payer: Self-pay | Admitting: General Practice

## 2019-09-24 ENCOUNTER — Telehealth: Payer: Self-pay

## 2019-09-24 DIAGNOSIS — E785 Hyperlipidemia, unspecified: Secondary | ICD-10-CM

## 2019-09-24 DIAGNOSIS — N182 Chronic kidney disease, stage 2 (mild): Secondary | ICD-10-CM

## 2019-09-24 DIAGNOSIS — E1169 Type 2 diabetes mellitus with other specified complication: Secondary | ICD-10-CM | POA: Diagnosis not present

## 2019-09-24 DIAGNOSIS — I10 Essential (primary) hypertension: Secondary | ICD-10-CM

## 2019-09-24 DIAGNOSIS — B029 Zoster without complications: Secondary | ICD-10-CM

## 2019-09-24 NOTE — Telephone Encounter (Signed)
Copied from [place] [id number]. Topic: General - Other >> Sep 24, 2019 12:11 PM Leward Quan A wrote: Reason for CRM: Patient called to inquire of Dr Raliegh Ip that since she was in there a few weeks ago with the shingles is it ok for her to go ahead and get her covid vaccine. Please call patient with an answer at Ph# [phone]

## 2019-09-24 NOTE — Patient Instructions (Signed)
Visit Information  Goals Addressed            This Visit's Progress   . RNCM: Pt's daughter-"When can mom get the COVID 19 vaccine?" (pt-stated)       CARE PLAN ENTRY (see longtitudinal plan of care for additional care plan information)  Current Barriers:  Marland Kitchen Knowledge Deficits related to COVID-19 and impact on patient self health management . Recent outbreak of shingles and the patient and patients daughter want to know when it is safe to take the COVID19 vaccine.   Clinical Goal(s):  Marland Kitchen Over the next 30 days, patient will verbalize basic understanding of COVID-19 impact on individual health and self health management as evidenced by verbalization of basic understanding of COVID-19 as a viral disease, measures to prevent exposure, signs and symptoms, when to contact provider  Interventions: . Advised patient to get COVID 19 vaccine at anytime per recommendations by Dr. Parks Ranger . Provided education to patient re: pharmacy's locally having the COVID 19 vaccine available . Collaborated with pcp and pharm D regarding Covid 19 vaccine and when it is safe for the patient to take after having shingles.   Patient Self Care Activities:  . Patient verbalizes understanding of plan to have the COVID 19 vaccine . Attends all scheduled provider appointments . Calls provider office for new concerns or questions  Initial goal documentation  For information about COVID-19 or "Corona Virus", the following web resources may be helpful:  CDC: BeginnerSteps.be    [place]:  [email or web address]                    . RNCM: Pt-"I thought a spider bit me, but I have shingles" (pt-stated)       CARE PLAN ENTRY (see longitudinal plan of care for additional care plan information)  Current Barriers:  Marland Kitchen Knowledge Deficits related to etiology and  pathophysiology of shingles . Care Coordination needs related to new diagnosis of shingles  in a patient with several chronic conditions (disease states) . Chronic Disease Management support and education needs related to shingles and management of shingles  Nurse Case Manager Clinical Goal(s):  Marland Kitchen Over the next 120 days, patient will verbalize understanding of plan for effectively treating shingles without any complications . Over the next 120 days, patient will work with [facility] and pcp to address needs related to Shingles outbreak on her shoulder, neck, ear and hairline . Over the next 30 days, patient will attend all scheduled medical appointments: front office staff to contact the patient to set up an appointment to see pcp . Over the next 120 days, patient will demonstrate improved adherence to prescribed treatment plan for shingles as evidenced byno further outbreaks   Interventions:  . Inter-disciplinary care team collaboration (see longitudinal plan of care) . Evaluation of current treatment plan related to shingles and patient's adherence to plan as established by provider. . Advised patient to call the pcp for worsening condition or spread of shingles  . Provided education to patient re: to do meticulous hand washing, not to touch eyes, and to see the provider for evaluation and further treatment of shingles. The patient states some areas are crusted over.  Education on how shingles spread and being in the contagious state when there are blisters present. The patient states she is taking Aciclovir as directed but is almost completley finished. The patient is taking Tylenol every 4 hours to help with pain relief.  . Reviewed medications with patient and discussed The patient  is taking Aciclovir and Tylenol as directed  . Collaborated with pcp regarding outbreak of shingles and visit recently to the ER . Discussed plans with patient for ongoing care management follow up and provided patient  with direct contact information for care management team . Education provided to the patient's daughter today about the patient being able to safely take the shingles vaccine around 10/16/2019 or after. Education and information provided from the pcp and  CCM pharmacist.  The patients daughter verbalized understanding. Also reminded the daughter of the patient possibly having pain, itching, or residual effects from having the shingles.  . Reviewed scheduled/upcoming provider appointments including: front office staff to call and get an appointment for the patient to come in and see pcp.   Patient Self Care Activities:  . Patient verbalizes understanding of plan to come in to see the pcp post ER visit for evaluation and follow up . Self administers medications as prescribed . Calls provider office for new concerns or questions . Unable to independently manage outbreak of shingles   Please see past updates related to this goal by clicking on the "Past Updates" button in the selected goal      . RNCM: Pt-"I usually take my blood pressure daily" (pt-stated)       CARE PLAN ENTRY (see longtitudinal plan of care for additional care plan information)  Current Barriers:  . Chronic Disease Management support, education, and care coordination needs related to HTN, HLD, DMII, and CKD Stage 2  Clinical Goal(s) related to HTN, HLD, DMII, and CKD Stage 2 :  Over the next 120 days, patient will:  . Work with the care management team to address educational, disease management, and care coordination needs  . Begin or continue self health monitoring activities as directed today Measure and record cbg (blood glucose) 3 times a week (last hemoglobin A1C 6.0), Measure and record blood pressure 3 times per week, and adhere to a heart healthy/ADA diet . Call provider office for new or worsened signs and symptoms Blood glucose findings outside established parameters, Blood pressure findings outside established  parameters, Oxygen saturation lower than established parameter, Shortness of breath, and New or worsened symptom related to HLD, CKD2,  and other chronic conditions . Call care management team with questions or concerns . Verbalize basic understanding of patient centered plan of care established today  Interventions related to HTN, HLD, DMII, and CKD Stage 2 :  . Evaluation of current treatment plans and patient's adherence to plan as established by provider . Assessed patient understanding of disease states.  The patient has a good understanding of her conditions. Has an appointment on the 26th of May to talk to the cardiologist about next steps with a possible valve replacement. The patient had an ECHO on 08-19-2019 and her EF was 60-65%.  Explained to the patient what the EF % was.  The patient takes her blood pressure regularly. Values given for when she had the house calls visit on 09-02-2019.  Explained this also to the patients daughter during the call.  . Assessed patient's education and care coordination needs.  The verbalized understanding of education given today. The RNCM will contact the front office staff to establish and appointment for the patient to be seen by pcp.  . Provided disease specific education to patient.  Education and support on cardiac function and what the EF % means from her ECHO. The patient will see the cardiologist on 09-29-2019 for follow up from testing and recommendations  .  Collaborated with appropriate clinical care team members regarding patient needs.  Patient denies any needs from the LCSW or pharmacist. Will continue to monitor. She knows CCM team is available to assist.   Patient Self Care Activities related to HTN, HLD, DMII, and CKD Stage 2 :  . Patient is unable to independently self-manage chronic health conditions  Please see past updates related to this goal by clicking on the "Past Updates" button in the selected goal         Patient verbalizes  understanding of instructions provided today.   The care management team will reach out to the patient again over the next 60 days.   Noreene Larsson RN, MSN, [place] Mobile: [phone]

## 2019-09-24 NOTE — Chronic Care Management (AMB) (Signed)
Chronic Care Management   Follow Up Note   09/24/2019 Name: Diana Harmon MRN: [id number] DOB: 04/02/1946  Referred by: [name], Alexander J, DO Reason for referral : Chronic Care Management (call back after the daughter had questions about the COVID vaccine and shingles vaccine)   Diana Harmon is a 74 y.o. year old female who is a primary care patient of [name], Alexander J, DO. The CCM team was consulted for assistance with chronic disease management and care coordination needs.    Review of patient status, including review of consultants reports, relevant laboratory and other test results, and collaboration with appropriate care team members and the patient's provider was performed as part of comprehensive patient evaluation and provision of chronic care management services.    SDOH (Social Determinants of Health) assessments performed: No See Care Plan activities for detailed interventions related to SDOH)     Outpatient Encounter Medications as of 09/24/2019  Medication Sig Note  . acetaminophen (TYLENOL) 500 MG tablet Take 1,000 mg by mouth every 6 (six) hours as needed for moderate pain.   . aspirin EC 81 MG tablet Take 1 tablet (81 mg total) by mouth daily.   . baclofen (LIORESAL) 10 MG tablet TAKE 1 TO 2 TABLETS BY MOUTH TWICE DAILY AS NEEDED FOR MUSCLE SPASM   . Blood Glucose Monitoring Suppl (ACCU-CHEK NANO SMARTVIEW) w/Device KIT Use to check blood glucose up to once daily as instructed   . cholecalciferol (VITAMIN D) 1000 units tablet Take 2,000 Units by mouth daily.    . Cyanocobalamin (VITAMIN B 12 PO) Take 10 mLs by mouth. Taking 10ml daily   . Ferrous Sulfate (IRON) 325 (65 Fe) MG TABS TAKE 1 TABLET BY MOUTH TWICE DAILY WITH A MEAL   . furosemide (LASIX) 20 MG tablet Take 1 tablet (20 mg total) by mouth daily.   . gabapentin (NEURONTIN) 100 MG capsule Start 1 capsule daily, increase by 1 cap every 2-3 days as tolerated up to 3 times a day, or  may take 3 at once in evening.   . glucose blood (ACCU-CHEK SMARTVIEW) test strip Check blood glucose up to once daily as instructed   . hydrochlorothiazide (HYDRODIURIL) 25 MG tablet Take 1 tablet by mouth once daily   . lactobacillus acidophilus (BACID) TABS tablet Take 2 tablets by mouth 2 (two) times daily. Once in the am as needed 10/17/2015: PRN  . levocetirizine (XYZAL) 5 MG tablet Take 1 tablet (5 mg total) by mouth every evening.   . losartan (COZAAR) 50 MG tablet Take 1 tablet by mouth once daily   . metFORMIN (GLUCOPHAGE) 500 MG tablet Take 1 tablet (500 mg total) by mouth daily with supper.   . mupirocin ointment (BACTROBAN) 2 % Apply 1 application topically 2 (two) times daily.   . predniSONE (DELTASONE) 50 MG tablet Take 1 tablet (50 mg total) by mouth daily with breakfast.   . simvastatin (ZOCOR) 20 MG tablet TAKE 1 TABLET BY MOUTH ONCE DAILY AT 6 PM   . [DISCONTINUED] Cromolyn Sodium (NASAL ALLERGY NA) Place 1 spray into the nose daily as needed (allergies).    No facility-administered encounter medications on file as of 09/24/2019.     Objective:   Goals Addressed            This Visit's Progress   . RNCM: Pt's daughter-"When can mom get the COVID 19 vaccine?" (pt-stated)       CARE PLAN ENTRY (see longtitudinal plan of care for additional care   plan information)  Current Barriers:  . Knowledge Deficits related to COVID-19 and impact on patient self health management . Recent outbreak of shingles and the patient and patients daughter want to know when it is safe to take the COVID19 vaccine.   Clinical Goal(s):  . Over the next 30 days, patient will verbalize basic understanding of COVID-19 impact on individual health and self health management as evidenced by verbalization of basic understanding of COVID-19 as a viral disease, measures to prevent exposure, signs and symptoms, when to contact provider  Interventions: . Advised patient to get COVID 19 vaccine at anytime  per recommendations by Dr. [name] . Provided education to patient re: pharmacy's locally having the COVID 19 vaccine available . Collaborated with pcp and pharm D regarding Covid 19 vaccine and when it is safe for the patient to take after having shingles.   Patient Self Care Activities:  . Patient verbalizes understanding of plan to have the COVID 19 vaccine . Attends all scheduled provider appointments . Calls provider office for new concerns or questions  Initial goal documentation  For information about COVID-19 or "Corona Virus", the following web resources may be helpful:  CDC: https://www.cdc.gov/coronavirus/2019-ncov/index.html    [place]:  https://www.[facility].com/services/primary-care/coronavirus-covid-19-resource-guide-where-to-get-the-facts-and-l/                    . RNCM: Pt-"I thought a spider bit me, but I have shingles" (pt-stated)       CARE PLAN ENTRY (see longitudinal plan of care for additional care plan information)  Current Barriers:  . Knowledge Deficits related to etiology and pathophysiology of shingles . Care Coordination needs related to new diagnosis of shingles  in a patient with several chronic conditions (disease states) . Chronic Disease Management support and education needs related to shingles and management of shingles  Nurse Case Manager Clinical Goal(s):  . Over the next 120 days, patient will verbalize understanding of plan for effectively treating shingles without any complications . Over the next 120 days, patient will work with RNCM and pcp to address needs related to Shingles outbreak on her shoulder, neck, ear and hairline . Over the next 30 days, patient will attend all scheduled medical appointments: front office staff to contact the patient to set up an appointment to see pcp . Over the next 120 days, patient will demonstrate improved adherence to prescribed treatment plan for shingles as evidenced byno further  outbreaks   Interventions:  . Inter-disciplinary care team collaboration (see longitudinal plan of care) . Evaluation of current treatment plan related to shingles and patient's adherence to plan as established by provider. . Advised patient to call the pcp for worsening condition or spread of shingles  . Provided education to patient re: to do meticulous hand washing, not to touch eyes, and to see the provider for evaluation and further treatment of shingles. The patient states some areas are crusted over.  Education on how shingles spread and being in the contagious state when there are blisters present. The patient states she is taking Aciclovir as directed but is almost completley finished. The patient is taking Tylenol every 4 hours to help with pain relief.  . Reviewed medications with patient and discussed The patient is taking Aciclovir and Tylenol as directed  . Collaborated with pcp regarding outbreak of shingles and visit recently to the ER . Discussed plans with patient for ongoing care management follow up and provided patient with direct contact information for care management team . Education provided to the   patient's daughter today about the patient being able to safely take the shingles vaccine around 10/16/2019 or after. Education and information provided from the pcp and  CCM pharmacist.  The patients daughter verbalized understanding. Also reminded the daughter of the patient possibly having pain, itching, or residual effects from having the shingles.  . Reviewed scheduled/upcoming provider appointments including: front office staff to call and get an appointment for the patient to come in and see pcp.   Patient Self Care Activities:  . Patient verbalizes understanding of plan to come in to see the pcp post ER visit for evaluation and follow up . Self administers medications as prescribed . Calls provider office for new concerns or questions . Unable to independently manage  outbreak of shingles   Please see past updates related to this goal by clicking on the "Past Updates" button in the selected goal      . RNCM: Pt-"I usually take my blood pressure daily" (pt-stated)       CARE PLAN ENTRY (see longtitudinal plan of care for additional care plan information)  Current Barriers:  . Chronic Disease Management support, education, and care coordination needs related to HTN, HLD, DMII, and CKD Stage 2  Clinical Goal(s) related to HTN, HLD, DMII, and CKD Stage 2 :  Over the next 120 days, patient will:  . Work with the care management team to address educational, disease management, and care coordination needs  . Begin or continue self health monitoring activities as directed today Measure and record cbg (blood glucose) 3 times a week (last hemoglobin A1C 6.0), Measure and record blood pressure 3 times per week, and adhere to a heart healthy/ADA diet . Call provider office for new or worsened signs and symptoms Blood glucose findings outside established parameters, Blood pressure findings outside established parameters, Oxygen saturation lower than established parameter, Shortness of breath, and New or worsened symptom related to HLD, CKD2,  and other chronic conditions . Call care management team with questions or concerns . Verbalize basic understanding of patient centered plan of care established today  Interventions related to HTN, HLD, DMII, and CKD Stage 2 :  . Evaluation of current treatment plans and patient's adherence to plan as established by provider . Assessed patient understanding of disease states.  The patient has a good understanding of her conditions. Has an appointment on the 26th of May to talk to the cardiologist about next steps with a possible valve replacement. The patient had an ECHO on 08-19-2019 and her EF was 60-65%.  Explained to the patient what the EF % was.  The patient takes her blood pressure regularly. Values given for when she had the  house calls visit on 09-02-2019.  Explained this also to the patients daughter during the call.  . Assessed patient's education and care coordination needs.  The verbalized understanding of education given today. The RNCM will contact the front office staff to establish and appointment for the patient to be seen by pcp.  . Provided disease specific education to patient.  Education and support on cardiac function and what the EF % means from her ECHO. The patient will see the cardiologist on 09-29-2019 for follow up from testing and recommendations  . Collaborated with appropriate clinical care team members regarding patient needs.  Patient denies any needs from the LCSW or pharmacist. Will continue to monitor. She knows CCM team is available to assist.   Patient Self Care Activities related to HTN, HLD, DMII, and CKD Stage 2 :  .   Patient is unable to independently self-manage chronic health conditions  Please see past updates related to this goal by clicking on the "Past Updates" button in the selected goal          Plan:   The care management team will reach out to the patient again over the next 60 days.    Pam Tate RN, MSN, CCM Community Care Coordinator [place]  [facility] Mobile: [phone]  

## 2019-09-24 NOTE — Telephone Encounter (Signed)
Patient was scheduled for a phone call with our CCM team and spoke with Noreene Larsson RN today already.  She asked her questions regarding Shingles vaccine after shingles.  See her note for detailed info on response to Shingles plan.  Also now asking about COVID19 vaccine.  I spoke with Pam and here is the current plan. She will relay to patient.  Shingles onset 09/04/19. She should wait 6 weeks and rash resolved or 10/16/19 or later to start 2 dose Shingrix. She can get it at pharmacy for medicare coverage.  She can get COVID19 vaccine at anytime starting now.  NO longer has to wait 2 weeks between vaccines / COVID vaccine.  [name], DO [facility] 09/24/2019, 3:03 PM

## 2019-09-29 ENCOUNTER — Other Ambulatory Visit: Payer: Self-pay

## 2019-09-29 ENCOUNTER — Ambulatory Visit: Payer: Medicare Other | Admitting: Internal Medicine

## 2019-09-29 ENCOUNTER — Encounter: Payer: Self-pay | Admitting: Internal Medicine

## 2019-09-29 VITALS — BP 122/60 | HR 82 | Ht 60.0 in | Wt 228.2 lb

## 2019-09-29 DIAGNOSIS — I350 Nonrheumatic aortic (valve) stenosis: Secondary | ICD-10-CM | POA: Diagnosis not present

## 2019-09-29 DIAGNOSIS — Z01810 Encounter for preprocedural cardiovascular examination: Secondary | ICD-10-CM

## 2019-09-29 DIAGNOSIS — I5032 Chronic diastolic (congestive) heart failure: Secondary | ICD-10-CM

## 2019-09-29 DIAGNOSIS — I10 Essential (primary) hypertension: Secondary | ICD-10-CM

## 2019-09-29 NOTE — Patient Instructions (Signed)
Medication Instructions:  Your physician recommends that you continue on your current medications as directed. Please refer to the Current Medication list given to you today.  *If you need a refill on your cardiac medications before your next appointment, please call your pharmacy*   Lab Work: 1- Your physician recommends that you return for lab work in: [place], BMET.   2- COVID PRE- TEST: You will need a COVID TEST prior to the procedure:  LOCATION: [facility].  DATE/TIME:  Friday, October 15, 2019 between the hours of 8 am and 1 pm.   If you have labs (blood work) drawn today and your tests are completely normal, you will receive your results only by: [facility] MyChart Message (if you have MyChart) OR . A paper copy in the mail If you have any lab test that is abnormal or we need to change your treatment, we will call you to review the results.   Testing/Procedures:   You are scheduled for a RIGHT AND LEFT Cardiac Catheterization on Tuesday, June 15 with Dr. Harrell Gave End.  1. Please arrive at the [facility] Entrance at 6:30 AM (This time is one hour before your procedure to ensure your preparation). Free valet parking service is available.   Special note: Every effort is made to have your procedure done on time. Please understand that emergencies sometimes delay scheduled procedures.  2. Diet: Do not eat solid foods after midnight.  The patient may have clear liquids until 5am upon the day of the procedure.  3. Labs: You will need to have blood drawn on TODAY - CBC, BMET.  4. Medication instructions in preparation for your procedure:   Contrast Allergy: No  Do not take your furosemide and hydrochlorothiazide the morning of the procedure.   Do not take Diabetes Med Glucophage (Metformin) on the day of the procedure and HOLD 48 HOURS AFTER THE PROCEDURE.  On the morning of your procedure, take your Aspirin and any morning medicines NOT  listed above.  You may use sips of water.  5. Plan for one night stay--bring personal belongings. 6. Bring a current list of your medications and current insurance cards. 7. You MUST have a responsible person to drive you home. 8. Someone MUST be with you the first 24 hours after you arrive home or your discharge will be delayed. 9. Please wear clothes that are easy to get on and off and wear slip-on shoes.  Thank you for allowing [place] to care for you!   -- [place] [facility]    Follow-Up: At [facility], you and your health needs are our priority.  As part of our continuing mission to provide you with exceptional heart care, we have created designated Provider Care Teams.  These Care Teams include your primary Cardiologist (physician) and Advanced Practice Providers (APPs -  Physician Assistants and Nurse Practitioners) who all work together to provide you with the care you need, when you need it.  We recommend signing up for the patient portal called "MyChart".  Sign up information is provided on this After Visit Summary.  MyChart is used to connect with patients for Virtual Visits (Telemedicine).  Patients are able to view lab/test results, encounter notes, upcoming appointments, etc.  Non-urgent messages can be sent to your provider as well.   To learn more about what you can do with MyChart, go to [email or web address].    Your next appointment:   Will be determined  after the cath.  The format for your next appointment:   In Person  Provider:    You may see DR Harrell Gave END or one of the following Advanced Practice Providers on your designated Care Team:    Murray Hodgkins, NP  Christell Faith, PA-C  Marrianne Mood, PA-C     Coronary Angiogram With Stent Coronary angiogram with stent placement is a procedure to widen or open a narrow blood vessel of the heart (coronary artery). Arteries may become blocked by cholesterol buildup (plaques) in the  lining of the artery wall. When a coronary artery becomes partially blocked, blood flow to that area decreases. This may lead to chest pain or a heart attack (myocardial infarction). A stent is a small piece of metal that looks like mesh or spring. Stent placement may be done as treatment after a heart attack, or to prevent a heart attack if a blocked artery is found by a coronary angiogram. Let your health care provider know about:  Any allergies you have, including allergies to medicines or contrast dye.  All medicines you are taking, including vitamins, herbs, eye drops, creams, and over-the-counter medicines.  Any problems you or family members have had with anesthetic medicines.  Any blood disorders you have.  Any surgeries you have had.  Any medical conditions you have, including kidney problems or kidney failure.  Whether you are pregnant or may be pregnant.  Whether you are breastfeeding. What are the risks? Generally, this is a safe procedure. However, serious problems may occur, including:  Damage to nearby structures or organs, such as the heart, blood vessels, or kidneys.  A return of blockage.  Bleeding, infection, or bruising at the insertion site.  A collection of blood under the skin (hematoma) at the insertion site.  A blood clot in another part of the body.  Allergic reaction to medicines or dyes.  Bleeding into the abdomen (retroperitoneal bleeding).  Stroke (rare).  Heart attack (rare). What happens before the procedure? Staying hydrated Follow instructions from your health care provider about hydration, which may include:  Up to 2 hours before the procedure - you may continue to drink clear liquids, such as water, clear fruit juice, black coffee, and plain tea.  Eating and drinking restrictions Follow instructions from your health care provider about eating and drinking, which may include:  8 hours before the procedure - stop eating heavy meals or  foods, such as meat, fried foods, or fatty foods.  6 hours before the procedure - stop eating light meals or foods, such as toast or cereal.  2 hours before the procedure - stop drinking clear liquids. Medicines Ask your health care provider about:  Changing or stopping your regular medicines. This is especially important if you are taking diabetes medicines or blood thinners.  Taking medicines such as aspirin and ibuprofen. These medicines can thin your blood. Do not take these medicines unless your health care provider tells you to take them. ? Generally, aspirin is recommended before a thin tube, called a catheter, is passed through a blood vessel and inserted into the heart (cardiac catheterization).  Taking over-the-counter medicines, vitamins, herbs, and supplements. General instructions  Do not use any products that contain nicotine or tobacco for at least 4 weeks before the procedure. These products include cigarettes, e-cigarettes, and chewing tobacco. If you need help quitting, ask your health care provider.  Plan to have someone take you home from the hospital or clinic.  If you will be going home  right after the procedure, plan to have someone with you for 24 hours.  You may have tests and imaging procedures.  Ask your health care provider: ? How your insertion site will be marked. Ask which artery will be used for the procedure. ? What steps will be taken to help prevent infection. These may include:  Removing hair at the insertion site.  Washing skin with a germ-killing soap.  Taking antibiotic medicine. What happens during the procedure?   An IV will be inserted into one of your veins.  Electrodes may be placed on your chest to monitor your heart rate during the procedure.  You will be given one or more of the following: ? A medicine to help you relax (sedative). ? A medicine to numb the area (local anesthetic) for catheter insertion.  A small incision will  be made for catheter insertion.  The catheter will be inserted into an artery using a guide wire. The location may be in your groin, your wrist, or the fold of your arm (near your elbow).  An X-ray procedure (fluoroscopy) will be used to help guide the catheter to the opening of the heart arteries.  A dye will be injected into the catheter. X-rays will be taken. The dye helps to show where any narrowing or blockages are located in the arteries.  Tell your health care provider if you have chest pain or trouble breathing.  A tiny wire will be guided to the blocked spot, and a balloon will be inflated to make the artery wider.  The stent will be expanded to crush the plaques into the wall of the vessel. The stent will hold the area open and improve the blood flow. Most stents have a drug coating to reduce the risk of the stent narrowing over time.  The artery may be made wider using a drill, laser, or other tools that remove plaques.  The catheter will be removed when the blood flow improves. The stent will stay where it was placed, and the lining of the artery will grow over it.  A bandage (dressing) will be placed on the insertion site. Pressure will be applied to stop bleeding.  The IV will be removed. This procedure may vary among health care providers and hospitals. What happens after the procedure?  Your blood pressure, heart rate, breathing rate, and blood oxygen level will be monitored until you leave the hospital or clinic.  If the procedure is done through the leg, you will lie flat in bed for a few hours or for as long as told by your health care provider. You will be instructed not to bend or cross your legs.  The insertion site and the pulse in your foot or wrist will be checked often.  You may have more blood tests, X-rays, and a test that records the electrical activity of your heart (electrocardiogram, or ECG).  Do not drive for 24 hours if you were given a sedative  during your procedure. Summary  Coronary angiogram with stent placement is a procedure to widen or open a narrowed coronary artery. This is done to treat heart problems.  Before the procedure, let your health care provider know about all the medical conditions and surgeries you have or have had.  This is a safe procedure. However, some problems may occur, including damage to nearby structures or organs, bleeding, blood clots, or allergies.  Follow your health care provider's instructions about eating, drinking, medicines, and other lifestyle changes, such as quitting  tobacco use before the procedure. This information is not intended to replace advice given to you by your health care provider. Make sure you discuss any questions you have with your health care provider. Document Revised: 11/11/2018 Document Reviewed: 11/11/2018 Elsevier Patient Education  [place].

## 2019-09-29 NOTE — Progress Notes (Signed)
Follow-up Outpatient Visit Date: 09/29/2019  Primary Care Provider: [name], DO [address] [address]  Chief Complaint: Follow-up HFpEF and aortic stenosis  HPI:  Diana Harmon is a 74 y.o. female with history of aortic stenosis, hypertension, hyperlipidemia, diabetes mellitus, and thyroid disease, who presents for follow-up of aortic stenosis.  I last saw Diana Harmon a month ago, at which time we reviewed her recent echo findings indicating progression of her aortic stenosis.  Peak velocity and valve area were now consistent with severe aortic stenosis.  Though she is primarily limited by back and leg pain, she also endorsed shortness of breath with modest activity.  She appeared to be mildly volume overloaded.  We therefore added furosemide 20 mg daily with plans for close outpatient follow-up to readdress further work-up and ultimate intervention on her aortic valve.  Today, Diana Harmon reports that she is recovering from an episode of shingles involving her left shoulder, neck, and face.  She still has some intermittent discomfort, though the vesicles have healed and pain is gradually improving.  She is treated with gabapentin and steroids, with resultant leg swelling.  She had previously noted improvement in her leg edema with addition of furosemide at our last visit.  Exertional dyspnea with modest activity remains unchanged.  She has not had any chest pain, palpitations, or lightheadedness.  --------------------------------------------------------------------------------------------------  Past Medical History:  Diagnosis Date  . Anemia   . Aortic stenosis   . Benign tumor   . Diabetes mellitus without complication ([place])   . Hypertension   . Nausea   . Patient is [name]   . Thyroid disease    Past Surgical History:  Procedure Laterality Date  . COLONOSCOPY    . COLONOSCOPY WITH PROPOFOL N/A 03/13/2015   Procedure: COLONOSCOPY WITH PROPOFOL;   Surgeon: Manya Silvas, MD;  Location: [facility] ENDOSCOPY;  Service: Endoscopy;  Laterality: N/A;  . ESOPHAGOGASTRODUODENOSCOPY N/A 03/13/2015   Procedure: ESOPHAGOGASTRODUODENOSCOPY (EGD);  Surgeon: Manya Silvas, MD;  Location: [facility] ENDOSCOPY;  Service: Endoscopy;  Laterality: N/A;  . THYROIDECTOMY    . tumor removed      Current Meds  Medication Sig  . acetaminophen (TYLENOL) 500 MG tablet Take 1,000 mg by mouth every 6 (six) hours as needed for moderate pain.  Marland Kitchen aspirin EC 81 MG tablet Take 1 tablet (81 mg total) by mouth daily.  . baclofen (LIORESAL) 10 MG tablet TAKE 1 TO 2 TABLETS BY MOUTH TWICE DAILY AS NEEDED FOR MUSCLE SPASM  . Blood Glucose Monitoring Suppl (ACCU-CHEK NANO SMARTVIEW) w/Device KIT Use to check blood glucose up to once daily as instructed  . cholecalciferol (VITAMIN D) 1000 units tablet Take 2,000 Units by mouth daily.   . Cyanocobalamin (VITAMIN B 12 PO) Take 10 mLs by mouth. Taking 71m daily  . Ferrous Sulfate (IRON) 325 (65 Fe) MG TABS TAKE 1 TABLET BY MOUTH TWICE DAILY WITH A MEAL  . furosemide (LASIX) 20 MG tablet Take 1 tablet (20 mg total) by mouth daily.  .Marland Kitchengabapentin (NEURONTIN) 100 MG capsule Start 1 capsule daily, increase by 1 cap every 2-3 days as tolerated up to 3 times a day, or may take 3 at once in evening. (Patient taking differently: Take 300 mg by mouth at bedtime. )  . glucose blood (ACCU-CHEK SMARTVIEW) test strip Check blood glucose up to once daily as instructed  . hydrochlorothiazide (HYDRODIURIL) 25 MG tablet Take 1 tablet by mouth once daily  . lactobacillus  acidophilus (BACID) TABS tablet Take 2 tablets by mouth 2 (two) times daily. Once in the am as needed  . levocetirizine (XYZAL) 5 MG tablet Take 1 tablet (5 mg total) by mouth every evening.  Marland Kitchen losartan (COZAAR) 50 MG tablet Take 1 tablet by mouth once daily  . metFORMIN (GLUCOPHAGE) 500 MG tablet Take 1 tablet (500 mg total) by mouth daily with supper.  . mupirocin ointment  (BACTROBAN) 2 % Apply 1 application topically 2 (two) times daily. (Patient taking differently: Apply 1 application topically 2 (two) times daily as needed. )  . simvastatin (ZOCOR) 20 MG tablet TAKE 1 TABLET BY MOUTH ONCE DAILY AT 6 PM    Allergies: Ferumoxytol, Other, and Penicillins  Social History   Tobacco Use  . Smoking status: Former Smoker    Years: 20.00    Types: Cigarettes    Quit date: 09/05/1979    Years since quitting: 40.0  . Smokeless tobacco: Former Network engineer Use Topics  . Alcohol use: Yes    Alcohol/week: 0.0 - 1.0 standard drinks    Comment: occ. wine  . Drug use: No    Family History  Problem Relation Age of Onset  . Parkinson's disease Mother   . Heart disease Father   . Lupus Sister   . Aneurysm Son   . Lupus Son   . Breast cancer Maternal Aunt        mat great aunt    Review of Systems: A 12-system review of systems was performed and was negative except as noted in the HPI.  --------------------------------------------------------------------------------------------------  Physical Exam: BP 122/60 (BP Location: Right Arm, Patient Position: Sitting, Cuff Size: Large)   Pulse 82   Ht 5' (1.524 m)   Wt 228 lb 4 oz (103.5 kg)   SpO2 98%   BMI 44.58 kg/m   General: NAD. Neck: No JVD or HJR. Lungs: Clear to auscultation bilaterally without wheezes or crackles. Heart: Regular rate and rhythm with 2/6 late peaking systolic murmur.  No rubs or gallops. Abdomen: Soft, nontender, nondistended. Extremities: 1+ pretibial edema bilaterally.  EKG: Normal sinus rhythm with right bundle branch block.  Lab Results  Component Value Date   WBC 4.4 09/29/2019   HGB 9.5 (L) 09/29/2019   HCT 29.5 (L) 09/29/2019   MCV 91 09/29/2019   PLT 234 09/29/2019    Lab Results  Component Value Date   NA 141 09/29/2019   K 4.5 09/29/2019   CL 103 09/29/2019   CO2 24 09/29/2019   BUN 36 (H) 09/29/2019   CREATININE 1.15 (H) 09/29/2019   GLUCOSE 225 (H)  09/29/2019   ALT 24 02/09/2018    Lab Results  Component Value Date   CHOL 163 07/21/2017   HDL 68 07/21/2017   LDLCALC 77 07/21/2017   TRIG 95 07/21/2017   CHOLHDL 2.4 07/21/2017    --------------------------------------------------------------------------------------------------  ASSESSMENT AND PLAN: Chronic HFpEF and aortic stenosis: Diana Harmon has noted some improvement with recent addition of furosemide, though this was confounded by addition of gabapentin and steroids for management of her shingles.  She still appears mildly volume overloaded today with NYHA class III symptoms and would benefit from ongoing diuresis with furosemide.  I will check basic metabolic panel today to ensure stable renal function and electrolytes.  We have discussed the natural history of aortic stenosis as well as treatment options.  Given that she has chronic anemia and is a [name] (refuses blood products), I do not believe that she  would be a good candidate for SAVR.  We have agreed to proceed with right and left heart catheterization for further assessment and likely referral to our TAVR clinic after completion of the procedure.  I have reviewed the risks, indications, and alternatives to cardiac catheterization, possible angioplasty, and stenting with the patient. Risks include but are not limited to bleeding, infection, vascular injury, stroke, myocardial infection, arrhythmia, kidney injury, radiation-related injury in the case of prolonged fluoroscopy use, emergency cardiac surgery, and death. The patient understands the risks of serious complication is 1-2 in 8063 with diagnostic cardiac cath and 1-2% or less with angioplasty/stenting.  Hypertension: Blood pressure well controlled today.  No medication changes at this time.  Follow-up: Return to clinic 2 to 3 weeks after catheterization.  Nelva Bush, MD 09/30/2019 8:01 PM

## 2019-09-29 NOTE — H&P (View-Only) (Signed)
Follow-up Outpatient Visit Date: 09/29/2019  Primary Care Provider: [name], DO [address] [address]  Chief Complaint: Follow-up HFpEF and aortic stenosis  HPI:  Diana Harmon is a 74 y.o. female with history of aortic stenosis, hypertension, hyperlipidemia, diabetes mellitus, and thyroid disease, who presents for follow-up of aortic stenosis.  I last saw Diana Harmon a month ago, at which time we reviewed her recent echo findings indicating progression of her aortic stenosis.  Peak velocity and valve area were now consistent with severe aortic stenosis.  Though she is primarily limited by back and leg pain, she also endorsed shortness of breath with modest activity.  She appeared to be mildly volume overloaded.  We therefore added furosemide 20 mg daily with plans for close outpatient follow-up to readdress further work-up and ultimate intervention on her aortic valve.  Today, Diana Harmon reports that she is recovering from an episode of shingles involving her left shoulder, neck, and face.  She still has some intermittent discomfort, though the vesicles have healed and pain is gradually improving.  She is treated with gabapentin and steroids, with resultant leg swelling.  She had previously noted improvement in her leg edema with addition of furosemide at our last visit.  Exertional dyspnea with modest activity remains unchanged.  She has not had any chest pain, palpitations, or lightheadedness.  --------------------------------------------------------------------------------------------------  Past Medical History:  Diagnosis Date  . Anemia   . Aortic stenosis   . Benign tumor   . Diabetes mellitus without complication ([place])   . Hypertension   . Nausea   . Patient is [name]   . Thyroid disease    Past Surgical History:  Procedure Laterality Date  . COLONOSCOPY    . COLONOSCOPY WITH PROPOFOL N/A 03/13/2015   Procedure: COLONOSCOPY WITH PROPOFOL;   Surgeon: Manya Silvas, MD;  Location: [facility] ENDOSCOPY;  Service: Endoscopy;  Laterality: N/A;  . ESOPHAGOGASTRODUODENOSCOPY N/A 03/13/2015   Procedure: ESOPHAGOGASTRODUODENOSCOPY (EGD);  Surgeon: Manya Silvas, MD;  Location: [facility] ENDOSCOPY;  Service: Endoscopy;  Laterality: N/A;  . THYROIDECTOMY    . tumor removed      Current Meds  Medication Sig  . acetaminophen (TYLENOL) 500 MG tablet Take 1,000 mg by mouth every 6 (six) hours as needed for moderate pain.  Marland Kitchen aspirin EC 81 MG tablet Take 1 tablet (81 mg total) by mouth daily.  . baclofen (LIORESAL) 10 MG tablet TAKE 1 TO 2 TABLETS BY MOUTH TWICE DAILY AS NEEDED FOR MUSCLE SPASM  . Blood Glucose Monitoring Suppl (ACCU-CHEK NANO SMARTVIEW) w/Device KIT Use to check blood glucose up to once daily as instructed  . cholecalciferol (VITAMIN D) 1000 units tablet Take 2,000 Units by mouth daily.   . Cyanocobalamin (VITAMIN B 12 PO) Take 10 mLs by mouth. Taking 9m daily  . Ferrous Sulfate (IRON) 325 (65 Fe) MG TABS TAKE 1 TABLET BY MOUTH TWICE DAILY WITH A MEAL  . furosemide (LASIX) 20 MG tablet Take 1 tablet (20 mg total) by mouth daily.  .Marland Kitchengabapentin (NEURONTIN) 100 MG capsule Start 1 capsule daily, increase by 1 cap every 2-3 days as tolerated up to 3 times a day, or may take 3 at once in evening. (Patient taking differently: Take 300 mg by mouth at bedtime. )  . glucose blood (ACCU-CHEK SMARTVIEW) test strip Check blood glucose up to once daily as instructed  . hydrochlorothiazide (HYDRODIURIL) 25 MG tablet Take 1 tablet by mouth once daily  . lactobacillus  acidophilus (BACID) TABS tablet Take 2 tablets by mouth 2 (two) times daily. Once in the am as needed  . levocetirizine (XYZAL) 5 MG tablet Take 1 tablet (5 mg total) by mouth every evening.  Marland Kitchen losartan (COZAAR) 50 MG tablet Take 1 tablet by mouth once daily  . metFORMIN (GLUCOPHAGE) 500 MG tablet Take 1 tablet (500 mg total) by mouth daily with supper.  . mupirocin ointment  (BACTROBAN) 2 % Apply 1 application topically 2 (two) times daily. (Patient taking differently: Apply 1 application topically 2 (two) times daily as needed. )  . simvastatin (ZOCOR) 20 MG tablet TAKE 1 TABLET BY MOUTH ONCE DAILY AT 6 PM    Allergies: Ferumoxytol, Other, and Penicillins  Social History   Tobacco Use  . Smoking status: Former Smoker    Years: 20.00    Types: Cigarettes    Quit date: 09/05/1979    Years since quitting: 40.0  . Smokeless tobacco: Former Network engineer Use Topics  . Alcohol use: Yes    Alcohol/week: 0.0 - 1.0 standard drinks    Comment: occ. wine  . Drug use: No    Family History  Problem Relation Age of Onset  . Parkinson's disease Mother   . Heart disease Father   . Lupus Sister   . Aneurysm Son   . Lupus Son   . Breast cancer Maternal Aunt        mat great aunt    Review of Systems: A 12-system review of systems was performed and was negative except as noted in the HPI.  --------------------------------------------------------------------------------------------------  Physical Exam: BP 122/60 (BP Location: Right Arm, Patient Position: Sitting, Cuff Size: Large)   Pulse 82   Ht 5' (1.524 m)   Wt 228 lb 4 oz (103.5 kg)   SpO2 98%   BMI 44.58 kg/m   General: NAD. Neck: No JVD or HJR. Lungs: Clear to auscultation bilaterally without wheezes or crackles. Heart: Regular rate and rhythm with 2/6 late peaking systolic murmur.  No rubs or gallops. Abdomen: Soft, nontender, nondistended. Extremities: 1+ pretibial edema bilaterally.  EKG: Normal sinus rhythm with right bundle branch block.  Lab Results  Component Value Date   WBC 4.4 09/29/2019   HGB 9.5 (L) 09/29/2019   HCT 29.5 (L) 09/29/2019   MCV 91 09/29/2019   PLT 234 09/29/2019    Lab Results  Component Value Date   NA 141 09/29/2019   K 4.5 09/29/2019   CL 103 09/29/2019   CO2 24 09/29/2019   BUN 36 (H) 09/29/2019   CREATININE 1.15 (H) 09/29/2019   GLUCOSE 225 (H)  09/29/2019   ALT 24 02/09/2018    Lab Results  Component Value Date   CHOL 163 07/21/2017   HDL 68 07/21/2017   LDLCALC 77 07/21/2017   TRIG 95 07/21/2017   CHOLHDL 2.4 07/21/2017    --------------------------------------------------------------------------------------------------  ASSESSMENT AND PLAN: Chronic HFpEF and aortic stenosis: Diana Harmon has noted some improvement with recent addition of furosemide, though this was confounded by addition of gabapentin and steroids for management of her shingles.  She still appears mildly volume overloaded today with NYHA class III symptoms and would benefit from ongoing diuresis with furosemide.  I will check basic metabolic panel today to ensure stable renal function and electrolytes.  We have discussed the natural history of aortic stenosis as well as treatment options.  Given that she has chronic anemia and is a [name] (refuses blood products), I do not believe that she  would be a good candidate for SAVR.  We have agreed to proceed with right and left heart catheterization for further assessment and likely referral to our TAVR clinic after completion of the procedure.  I have reviewed the risks, indications, and alternatives to cardiac catheterization, possible angioplasty, and stenting with the patient. Risks include but are not limited to bleeding, infection, vascular injury, stroke, myocardial infection, arrhythmia, kidney injury, radiation-related injury in the case of prolonged fluoroscopy use, emergency cardiac surgery, and death. The patient understands the risks of serious complication is 1-2 in 4656 with diagnostic cardiac cath and 1-2% or less with angioplasty/stenting.  Hypertension: Blood pressure well controlled today.  No medication changes at this time.  Follow-up: Return to clinic 2 to 3 weeks after catheterization.  Nelva Bush, MD 09/30/2019 8:01 PM

## 2019-09-30 ENCOUNTER — Encounter: Payer: Self-pay | Admitting: Internal Medicine

## 2019-09-30 DIAGNOSIS — I5032 Chronic diastolic (congestive) heart failure: Secondary | ICD-10-CM | POA: Insufficient documentation

## 2019-09-30 LAB — BASIC METABOLIC PANEL
BUN/Creatinine Ratio: 31 — ABNORMAL HIGH (ref 12–28)
BUN: 36 mg/dL — ABNORMAL HIGH (ref 8–27)
CO2: 24 mmol/L (ref 20–29)
Calcium: 9.8 mg/dL (ref 8.7–10.3)
Chloride: 103 mmol/L (ref 96–106)
Creatinine, Ser: 1.15 mg/dL — ABNORMAL HIGH (ref 0.57–1.00)
GFR calc Af Amer: 55 mL/min/{1.73_m2} — ABNORMAL LOW (ref >59)
GFR calc non Af Amer: 47 mL/min/{1.73_m2} — ABNORMAL LOW (ref >59)
Glucose: 225 mg/dL — ABNORMAL HIGH (ref 65–99)
Potassium: 4.5 mmol/L (ref 3.5–5.2)
Sodium: 141 mmol/L (ref 134–144)

## 2019-09-30 LAB — CBC WITH DIFFERENTIAL/PLATELET
Basophils Absolute: 0 10*3/uL (ref 0.0–0.2)
Basos: 1 %
EOS (ABSOLUTE): 0.1 10*3/uL (ref 0.0–0.4)
Eos: 3 %
Hematocrit: 29.5 % — ABNORMAL LOW (ref 34.0–46.6)
Hemoglobin: 9.5 g/dL — ABNORMAL LOW (ref 11.1–15.9)
Immature Grans (Abs): 0 10*3/uL (ref 0.0–0.1)
Immature Granulocytes: 1 %
Lymphocytes Absolute: 1.6 10*3/uL (ref 0.7–3.1)
Lymphs: 37 %
MCH: 29.4 pg (ref 26.6–33.0)
MCHC: 32.2 g/dL (ref 31.5–35.7)
MCV: 91 fL (ref 79–97)
Monocytes Absolute: 0.6 10*3/uL (ref 0.1–0.9)
Monocytes: 13 %
Neutrophils Absolute: 2.1 10*3/uL (ref 1.4–7.0)
Neutrophils: 45 %
Platelets: 234 10*3/uL (ref 150–450)
RBC: 3.23 x10E6/uL — ABNORMAL LOW (ref 3.77–5.28)
RDW: 14 % (ref 11.7–15.4)
WBC: 4.4 10*3/uL (ref 3.4–10.8)

## 2019-10-01 ENCOUNTER — Telehealth: Payer: Self-pay

## 2019-10-01 MED ORDER — FUROSEMIDE 20 MG PO TABS
20.0000 mg | ORAL_TABLET | Freq: Every day | ORAL | 6 refills | Status: DC | PRN
Start: 2019-10-01 — End: 2021-12-27

## 2019-10-01 NOTE — Telephone Encounter (Signed)
-----   Message from Nelva Bush, MD sent at 10/01/2019 12:56 PM EDT ----- Please let Ms. Karp know that her labs are relatively stable, though her BUN and creatinine have gone up slightly over the last 2 months.  This may be due to ongoing diuretic therapy.  I think it is reasonable for her to use furosemide 20 mg daily only as needed if she still has leg swelling.  Her chronic anemia is stable.  We will plan to proceed with catheterization, as discussed on Wednesday's office visit.

## 2019-10-01 NOTE — Telephone Encounter (Signed)
Call to patient to review labs.    Pt verbalized understanding and has no further questions at this time.    Advised pt to call for any further questions or concerns.  Updated lasix Rx.

## 2019-10-12 ENCOUNTER — Other Ambulatory Visit: Payer: Self-pay

## 2019-10-12 ENCOUNTER — Inpatient Hospital Stay: Payer: Medicare Other | Attending: Internal Medicine

## 2019-10-12 ENCOUNTER — Telehealth: Payer: Self-pay | Admitting: Internal Medicine

## 2019-10-12 ENCOUNTER — Inpatient Hospital Stay: Payer: Medicare Other | Admitting: Internal Medicine

## 2019-10-12 ENCOUNTER — Encounter: Payer: Self-pay | Admitting: Internal Medicine

## 2019-10-12 DIAGNOSIS — I350 Nonrheumatic aortic (valve) stenosis: Secondary | ICD-10-CM | POA: Insufficient documentation

## 2019-10-12 DIAGNOSIS — Z87891 Personal history of nicotine dependence: Secondary | ICD-10-CM | POA: Diagnosis not present

## 2019-10-12 DIAGNOSIS — Z7984 Long term (current) use of oral hypoglycemic drugs: Secondary | ICD-10-CM | POA: Diagnosis not present

## 2019-10-12 DIAGNOSIS — E538 Deficiency of other specified B group vitamins: Secondary | ICD-10-CM | POA: Diagnosis not present

## 2019-10-12 DIAGNOSIS — I10 Essential (primary) hypertension: Secondary | ICD-10-CM | POA: Diagnosis not present

## 2019-10-12 DIAGNOSIS — Z79899 Other long term (current) drug therapy: Secondary | ICD-10-CM | POA: Diagnosis not present

## 2019-10-12 DIAGNOSIS — E079 Disorder of thyroid, unspecified: Secondary | ICD-10-CM | POA: Insufficient documentation

## 2019-10-12 DIAGNOSIS — D649 Anemia, unspecified: Secondary | ICD-10-CM

## 2019-10-12 DIAGNOSIS — D509 Iron deficiency anemia, unspecified: Secondary | ICD-10-CM | POA: Insufficient documentation

## 2019-10-12 DIAGNOSIS — B029 Zoster without complications: Secondary | ICD-10-CM | POA: Insufficient documentation

## 2019-10-12 DIAGNOSIS — Z7982 Long term (current) use of aspirin: Secondary | ICD-10-CM | POA: Insufficient documentation

## 2019-10-12 DIAGNOSIS — Z803 Family history of malignant neoplasm of breast: Secondary | ICD-10-CM | POA: Diagnosis not present

## 2019-10-12 DIAGNOSIS — E119 Type 2 diabetes mellitus without complications: Secondary | ICD-10-CM | POA: Diagnosis not present

## 2019-10-12 LAB — CBC WITH DIFFERENTIAL/PLATELET
Abs Immature Granulocytes: 0 10*3/uL (ref 0.00–0.07)
Basophils Absolute: 0 10*3/uL (ref 0.0–0.1)
Basophils Relative: 1 %
Eosinophils Absolute: 0.1 10*3/uL (ref 0.0–0.5)
Eosinophils Relative: 3 %
HCT: 28.4 % — ABNORMAL LOW (ref 36.0–46.0)
Hemoglobin: 9.1 g/dL — ABNORMAL LOW (ref 12.0–15.0)
Immature Granulocytes: 0 %
Lymphocytes Relative: 43 %
Lymphs Abs: 1.7 10*3/uL (ref 0.7–4.0)
MCH: 30.7 pg (ref 26.0–34.0)
MCHC: 32 g/dL (ref 30.0–36.0)
MCV: 95.9 fL (ref 80.0–100.0)
Monocytes Absolute: 0.5 10*3/uL (ref 0.1–1.0)
Monocytes Relative: 14 %
Neutro Abs: 1.4 10*3/uL — ABNORMAL LOW (ref 1.7–7.7)
Neutrophils Relative %: 39 %
Platelets: 224 10*3/uL (ref 150–400)
RBC: 2.96 MIL/uL — ABNORMAL LOW (ref 3.87–5.11)
RDW: 15 % (ref 11.5–15.5)
WBC: 3.7 10*3/uL — ABNORMAL LOW (ref 4.0–10.5)
nRBC: 0 % (ref 0.0–0.2)

## 2019-10-12 LAB — IRON AND TIBC
Iron: 129 ug/dL (ref 28–170)
Saturation Ratios: 33 % — ABNORMAL HIGH (ref 10.4–31.8)
TIBC: 389 ug/dL (ref 250–450)
UIBC: 260 ug/dL

## 2019-10-12 LAB — LACTATE DEHYDROGENASE: LDH: 133 U/L (ref 98–192)

## 2019-10-12 LAB — FERRITIN: Ferritin: 47 ng/mL (ref 11–307)

## 2019-10-12 LAB — BASIC METABOLIC PANEL
Anion gap: 8 (ref 5–15)
BUN: 26 mg/dL — ABNORMAL HIGH (ref 8–23)
CO2: 24 mmol/L (ref 22–32)
Calcium: 9.2 mg/dL (ref 8.9–10.3)
Chloride: 107 mmol/L (ref 98–111)
Creatinine, Ser: 1.07 mg/dL — ABNORMAL HIGH (ref 0.44–1.00)
GFR calc Af Amer: 60 mL/min — ABNORMAL LOW (ref >60)
GFR calc non Af Amer: 51 mL/min — ABNORMAL LOW (ref >60)
Glucose, Bld: 180 mg/dL — ABNORMAL HIGH (ref 70–99)
Potassium: 4 mmol/L (ref 3.5–5.1)
Sodium: 139 mmol/L (ref 135–145)

## 2019-10-12 NOTE — Progress Notes (Signed)
Pt and caregiver in for follow up.  Pt only complaint is pain in knees.

## 2019-10-12 NOTE — Progress Notes (Signed)
[name]  Patient Care Team: [name], DO as PCP - General (Family Medicine) Cammie Sickle, MD as Consulting Physician (Internal Medicine) Lorelee Cover., MD (Ophthalmology) End, Harrell Gave, MD as Consulting Physician (Cardiology) Vanita Ingles, RN as Case Manager ([place]) Cammie Sickle, MD as Consulting Physician (Internal Medicine)  CHIEF COMPLAINTS/PURPOSE OF CONSULTATION:   # AUG 2016- IRON DEFICIENCY ANEMIA-  Possible AVMs of colon [s/p Argon laser; Dr.Elliot;EGD-neg Nov 2016] s/p IV Ferriheme x2 [[id number]; ALLERGY]; Venofer IV [pre-meds]; worsening anemia not responding to iron- November 2020-bone marrow biopsy-erythroid hyperplasia but no dysplasia no blasts. NOV 2020- CT A/P-negative; CXR-NEG.  #Relative B12 deficiency-sublingual B12 [Jan 2021]  #December 2020 -elevated anti-CCP- [s/p evaluation Dr. Elder Negus  # MILD LEUCOPENIA/NEUTROPENIA [facility] 1.2-1.3]-chronic ethnic neutropenia/asymptomatic  # Rectal well diff carcinoid [1.2CM; s/p polypectomy 2011]; Allergy- [facility] Philomena Course 2016]- neck/face swelling [improved with benadryl]  HISTORY OF PRESENTING ILLNESS:  Diana Harmon 74 y.o.  female is here for follow-up for anemia of unclear etiology is here for follow-up.  Patient is currently being worked up by cardiology for her severe AS.  Patient is awaiting cardiac cath.  Patient's appetite is good.  No weight loss.  Otherwise denies any blood in stools or black or stools.  Review of Systems  Constitutional: Negative for chills, diaphoresis, fever and weight loss.  HENT: Negative for nosebleeds and sore throat.   Eyes: Negative for double vision.  Respiratory: Negative for cough, hemoptysis, sputum production, shortness of breath and wheezing.   Cardiovascular: Negative for chest pain, palpitations, orthopnea and leg swelling.  Gastrointestinal: Negative for abdominal pain, blood in  stool, constipation, diarrhea, heartburn, melena, nausea and vomiting.  Genitourinary: Negative for dysuria, frequency and urgency.  Musculoskeletal: Positive for joint pain. Negative for back pain.  Skin: Negative.  Negative for itching and rash.  Neurological: Negative for dizziness, tingling, focal weakness, weakness and headaches.  Endo/Heme/Allergies: Does not bruise/bleed easily.  Psychiatric/Behavioral: Negative for depression. The patient is not nervous/anxious and does not have insomnia.      MEDICAL HISTORY:  Past Medical History:  Diagnosis Date  . Anemia   . Aortic stenosis   . Benign tumor   . Diabetes mellitus without complication ([place])   . Hypertension   . Nausea   . Patient is [name]   . Thyroid disease     SURGICAL HISTORY: Past Surgical History:  Procedure Laterality Date  . COLONOSCOPY    . COLONOSCOPY WITH PROPOFOL N/A 03/13/2015   Procedure: COLONOSCOPY WITH PROPOFOL;  Surgeon: Manya Silvas, MD;  Location: [facility] ENDOSCOPY;  Service: Endoscopy;  Laterality: N/A;  . ESOPHAGOGASTRODUODENOSCOPY N/A 03/13/2015   Procedure: ESOPHAGOGASTRODUODENOSCOPY (EGD);  Surgeon: Manya Silvas, MD;  Location: [facility] ENDOSCOPY;  Service: Endoscopy;  Laterality: N/A;  . THYROIDECTOMY    . tumor removed      SOCIAL HISTORY: Social History   Socioeconomic History  . Marital status: Divorced    Spouse name: Not on file  . Number of children: 1  . Years of education: 44  . Highest education level: 12th grade  Occupational History  . Occupation: retired  Tobacco Use  . Smoking status: Former Smoker    Years: 20.00    Types: Cigarettes    Quit date: 09/05/1979    Years since quitting: 40.1  . Smokeless tobacco: Former Network engineer and Sexual Activity  . Alcohol use: Yes    Alcohol/week: 0.0 - 1.0 standard drinks  Comment: occ. wine  . Drug use: No  . Sexual activity: Not on file  Other Topics Concern  . Not on file  Social History Narrative  .  Not on file   Social Determinants of Health   Financial Resource Strain: Low Risk   . Difficulty of Paying Living Expenses: Not hard at all  Food Insecurity: No Food Insecurity  . Worried About Charity fundraiser in the Last Year: Never true  . Ran Out of Food in the Last Year: Never true  Transportation Needs: No Transportation Needs  . Lack of Transportation (Medical): No  . Lack of Transportation (Non-Medical): No  Physical Activity: Inactive  . Days of Exercise per Week: 0 days  . Minutes of Exercise per Session: 0 min  Stress: No Stress Concern Present  . Feeling of Stress : Not at all  Social Connections: Slightly Isolated  . Frequency of Communication with Friends and Family: More than three times a week  . Frequency of Social Gatherings with Friends and Family: Never  . Attends Religious Services: More than 4 times per year  . Active Member of Clubs or Organizations: Yes  . Attends Archivist Meetings: More than 4 times per year  . Marital Status: Divorced  Human resources officer Violence: Not At Risk  . Fear of Current or Ex-Partner: No  . Emotionally Abused: No  . Physically Abused: No  . Sexually Abused: No    FAMILY HISTORY: Family History  Problem Relation Age of Onset  . Parkinson's disease Mother   . Heart disease Father   . Lupus Sister   . Aneurysm Son   . Lupus Son   . Breast cancer Maternal Aunt        mat great aunt    ALLERGIES:  is allergic to ferumoxytol; other; and penicillins.  MEDICATIONS:  Current Outpatient Medications  Medication Sig Dispense Refill  . acetaminophen (TYLENOL) 500 MG tablet Take 1,000 mg by mouth every 6 (six) hours as needed for moderate pain.    . Artificial Tear Solution (GENTEAL TEARS) 0.1-0.2-0.3 % SOLN Place 1 drop into both eyes 3 (three) times daily as needed (dry/irritated eyes.).    Marland Kitchen aspirin EC 81 MG tablet Take 1 tablet (81 mg total) by mouth daily. 100 tablet 12  . B COMPLEX-C PO Take 10 mLs by mouth  daily.    . baclofen (LIORESAL) 10 MG tablet TAKE 1 TO 2 TABLETS BY MOUTH TWICE DAILY AS NEEDED FOR MUSCLE SPASM (Patient taking differently: Take 10-20 mg by mouth 2 (two) times daily as needed (muscle spasms.). ) 60 tablet 2  . Blood Glucose Monitoring Suppl (ACCU-CHEK NANO SMARTVIEW) w/Device KIT Use to check blood glucose up to once daily as instructed 1 kit 0  . Cholecalciferol (VITAMIN D3) 125 MCG (5000 UT) TABS Take 5,000 Units by mouth daily.    . Ferrous Sulfate (IRON) 325 (65 Fe) MG TABS TAKE 1 TABLET BY MOUTH TWICE DAILY WITH A MEAL (Patient taking differently: Take 325 mg by mouth in the morning and at bedtime. ) 180 each 1  . furosemide (LASIX) 20 MG tablet Take 1 tablet (20 mg total) by mouth daily as needed for edema. (Patient taking differently: Take 20 mg by mouth daily as needed for edema (fluid retention). ) 45 tablet 6  . gabapentin (NEURONTIN) 100 MG capsule Start 1 capsule daily, increase by 1 cap every 2-3 days as tolerated up to 3 times a day, or may take 3 at  once in evening. (Patient taking differently: Take 100-300 mg by mouth 3 (three) times daily as needed (shingles pain.). ) 90 capsule 1  . glucose blood (ACCU-CHEK SMARTVIEW) test strip Check blood glucose up to once daily as instructed 100 each 5  . hydrochlorothiazide (HYDRODIURIL) 25 MG tablet Take 1 tablet by mouth once daily (Patient taking differently: Take 25 mg by mouth daily. ) 90 tablet 3  . lactobacillus acidophilus (BACID) TABS tablet Take 2 tablets by mouth 2 (two) times daily as needed (digestive health.).     Marland Kitchen levocetirizine (XYZAL) 5 MG tablet Take 1 tablet (5 mg total) by mouth every evening. (Patient taking differently: Take 5 mg by mouth daily as needed (allergies.). ) 90 tablet 3  . losartan (COZAAR) 50 MG tablet Take 1 tablet by mouth once daily (Patient taking differently: Take 50 mg by mouth daily. ) 90 tablet 1  . metFORMIN (GLUCOPHAGE) 500 MG tablet Take 1 tablet (500 mg total) by mouth daily with  supper. 90 tablet 1  . mupirocin ointment (BACTROBAN) 2 % Apply 1 application topically 2 (two) times daily. (Patient taking differently: Apply 1 application topically 2 (two) times daily as needed (wound care). ) 22 g 0  . simvastatin (ZOCOR) 20 MG tablet TAKE 1 TABLET BY MOUTH ONCE DAILY AT 6 PM (Patient taking differently: Take 20 mg by mouth every evening. ) 90 tablet 0   No current facility-administered medications for this visit.      Marland Kitchen  PHYSICAL EXAMINATION: ECOG PERFORMANCE STATUS: 1 - Symptomatic but completely ambulatory  Vitals:   10/12/19 1340  BP: 134/62  Pulse: 81  Resp: 18  Temp: 98.3 F (36.8 C)  SpO2: 100%   Filed Weights   10/12/19 1340  Weight: 230 lb 8 oz (104.6 kg)    Physical Exam  Constitutional: She is oriented to person, place, and time and well-developed, well-nourished, and in no distress.  Patient is obese.  In a wheelchair.  Accompanied by daughter.  HENT:  Head: Normocephalic and atraumatic.  Mouth/Throat: Oropharynx is clear and moist. No oropharyngeal exudate.  Eyes: Pupils are equal, round, and reactive to light.  Cardiovascular: Normal rate and regular rhythm.  Pulmonary/Chest: Effort normal and breath sounds normal. No respiratory distress. She has no wheezes.  Abdominal: Soft. Bowel sounds are normal. She exhibits no distension and no mass. There is no abdominal tenderness. There is no rebound and no guarding.  Musculoskeletal:        General: No tenderness or edema. Normal range of motion.     Cervical back: Normal range of motion and neck supple.  Neurological: She is alert and oriented to person, place, and time.  Skin: Skin is warm.  Psychiatric: Affect normal.    LABORATORY DATA:  I have reviewed the data as listed Lab Results  Component Value Date   WBC 3.7 (L) 10/12/2019   HGB 9.1 (L) 10/12/2019   HCT 28.4 (L) 10/12/2019   MCV 95.9 10/12/2019   PLT 224 10/12/2019   Recent Labs    07/12/19 1416 09/29/19 1143  10/12/19 1308  NA 138 141 139  K 4.1 4.5 4.0  CL 106 103 107  CO2 '24 24 24  ' GLUCOSE 141* 225* 180*  BUN 21 36* 26*  CREATININE 1.00 1.15* 1.07*  CALCIUM 9.2 9.8 9.2  GFRNONAA 56* 47* 51*  GFRAA >60 55* 60*    ASSESSMENT & PLAN:  Normocytic anemia #Normocytic anemia unclear etiology; previous history of AVM iron deficiency; September 2020-iron  studies negative for deficiency.  Bone marrow biopsy negative for any significant dyspoiesis.  Cytogenetics normal.  #Today hemoglobin is 9.2;  Slightly low; otherwise fairly asymptomatic.  Awaiting on repeat iron studies from today.  Patient is on p.o. iron.  Do not suspect hemolysis; however given history of severe AS-reasonable to check a monitoring work-up including LDH.  # Severe AS- awaiting cardiac cath; with cardiology.                            #Chronic benign ethnic neutropenia-neutrophil count 1.2.  Asymptomatic.  # Relative B12 deficiency-200; March 2021 -normal continue sublingual B12 tablets.    # Shingles-currently improved s/p acyclovir.  I spoke at length with the patient/daughter- regarding the patient's clinical status/plan of care.  Family agreement.   # DISPOSITION: ADD LDH/Ironstudies/ferritin # in 3 months-MD; labs- cbc/bmp--Dr.B       Cammie Sickle, MD 10/12/2019 2:55 PM

## 2019-10-12 NOTE — Telephone Encounter (Signed)
On 6/08-spoke to patient's daughter Lemmie Evens regarding slightly low ferritin-40s; hemoglobin around 9.2.  No concerns for hemolysis as LDH is normal.  Discussed regarding IV Venofer infusions.  Daughter will speak to her mother regarding IV infusions; and let us know if they are interested.  Currently awaiting cardiac catheterization.   FYI

## 2019-10-12 NOTE — Assessment & Plan Note (Addendum)
#  Normocytic anemia unclear etiology; previous history of AVM iron deficiency; September 2020-iron studies negative for deficiency.  Bone marrow biopsy negative for any significant dyspoiesis.  Cytogenetics normal.  #Today hemoglobin is 9.2;  Slightly low; otherwise fairly asymptomatic.  Awaiting on repeat iron studies from today.  Patient is on p.o. iron.  Do not suspect hemolysis; however given history of severe AS-reasonable to check a monitoring work-up including LDH.  # Severe AS- awaiting cardiac cath; with cardiology.                            #Chronic benign ethnic neutropenia-neutrophil count 1.2.  Asymptomatic.  # Relative B12 deficiency-200; March 2021 -normal continue sublingual B12 tablets.    # Shingles-currently improved s/p acyclovir.  I spoke at length with the patient/daughter- regarding the patient's clinical status/plan of care.  Family agreement.   # DISPOSITION: ADD LDH/Ironstudies/ferritin # in 3 months-MD; labs- cbc/bmp--Dr.B

## 2019-10-15 ENCOUNTER — Other Ambulatory Visit: Payer: Self-pay

## 2019-10-15 ENCOUNTER — Other Ambulatory Visit
Admission: RE | Admit: 2019-10-15 | Discharge: 2019-10-15 | Disposition: A | Discharge status: Home / Self Care | Payer: Medicare Other | Source: Ambulatory Visit | Attending: Internal Medicine | Admitting: Internal Medicine

## 2019-10-15 DIAGNOSIS — Z01812 Encounter for preprocedural laboratory examination: Secondary | ICD-10-CM | POA: Diagnosis not present

## 2019-10-15 DIAGNOSIS — Z20822 Contact with and (suspected) exposure to covid-19: Secondary | ICD-10-CM | POA: Insufficient documentation

## 2019-10-15 LAB — SARS CORONAVIRUS 2 (TAT 6-24 HRS): SARS Coronavirus 2: NEGATIVE

## 2019-10-19 ENCOUNTER — Encounter: Payer: Self-pay | Admitting: Internal Medicine

## 2019-10-19 ENCOUNTER — Ambulatory Visit
Admission: RE | Admit: 2019-10-19 | Discharge: 2019-10-19 | Disposition: A | Discharge status: Home / Self Care | Payer: Medicare Other | Attending: Internal Medicine | Admitting: Internal Medicine

## 2019-10-19 ENCOUNTER — Encounter
Admission: RE | Disposition: A | Discharge status: Home / Self Care | Payer: Self-pay | Source: Home / Self Care | Attending: Internal Medicine

## 2019-10-19 ENCOUNTER — Other Ambulatory Visit: Payer: Self-pay

## 2019-10-19 DIAGNOSIS — D649 Anemia, unspecified: Secondary | ICD-10-CM | POA: Insufficient documentation

## 2019-10-19 DIAGNOSIS — Z7982 Long term (current) use of aspirin: Secondary | ICD-10-CM | POA: Insufficient documentation

## 2019-10-19 DIAGNOSIS — E1169 Type 2 diabetes mellitus with other specified complication: Secondary | ICD-10-CM

## 2019-10-19 DIAGNOSIS — Z888 Allergy status to other drugs, medicaments and biological substances status: Secondary | ICD-10-CM | POA: Diagnosis not present

## 2019-10-19 DIAGNOSIS — I110 Hypertensive heart disease with heart failure: Secondary | ICD-10-CM | POA: Diagnosis not present

## 2019-10-19 DIAGNOSIS — E119 Type 2 diabetes mellitus without complications: Secondary | ICD-10-CM | POA: Diagnosis not present

## 2019-10-19 DIAGNOSIS — Z880 Allergy status to penicillin: Secondary | ICD-10-CM | POA: Diagnosis not present

## 2019-10-19 DIAGNOSIS — E785 Hyperlipidemia, unspecified: Secondary | ICD-10-CM | POA: Insufficient documentation

## 2019-10-19 DIAGNOSIS — I350 Nonrheumatic aortic (valve) stenosis: Secondary | ICD-10-CM

## 2019-10-19 DIAGNOSIS — E890 Postprocedural hypothyroidism: Secondary | ICD-10-CM | POA: Insufficient documentation

## 2019-10-19 DIAGNOSIS — Z7984 Long term (current) use of oral hypoglycemic drugs: Secondary | ICD-10-CM | POA: Diagnosis not present

## 2019-10-19 DIAGNOSIS — Z79899 Other long term (current) drug therapy: Secondary | ICD-10-CM | POA: Diagnosis not present

## 2019-10-19 DIAGNOSIS — Z87891 Personal history of nicotine dependence: Secondary | ICD-10-CM | POA: Diagnosis not present

## 2019-10-19 DIAGNOSIS — Z8249 Family history of ischemic heart disease and other diseases of the circulatory system: Secondary | ICD-10-CM | POA: Diagnosis not present

## 2019-10-19 DIAGNOSIS — I5032 Chronic diastolic (congestive) heart failure: Secondary | ICD-10-CM | POA: Diagnosis present

## 2019-10-19 HISTORY — PX: RIGHT/LEFT HEART CATH AND CORONARY ANGIOGRAPHY: CATH118266

## 2019-10-19 HISTORY — PX: CARDIAC CATHETERIZATION: SHX172

## 2019-10-19 HISTORY — DX: Nonrheumatic aortic (valve) stenosis: I35.0

## 2019-10-19 LAB — GLUCOSE, CAPILLARY
Glucose-Capillary: 170 mg/dL — ABNORMAL HIGH (ref 70–99)
Glucose-Capillary: 159 mg/dL — ABNORMAL HIGH (ref 70–99)

## 2019-10-19 SURGERY — RIGHT/LEFT HEART CATH AND CORONARY ANGIOGRAPHY
Anesthesia: Moderate Sedation

## 2019-10-19 MED ORDER — ASPIRIN 81 MG PO CHEW: 81.0000 mg | CHEWABLE_TABLET | ORAL | Status: DC

## 2019-10-19 MED ORDER — VERAPAMIL HCL 2.5 MG/ML IV SOLN
INTRAVENOUS | Status: DC | PRN
  Administered 2019-10-19: 2.5 mg via INTRA_ARTERIAL

## 2019-10-19 MED ORDER — ONDANSETRON HCL 4 MG/2ML IJ SOLN: 4.0000 mg | Freq: Four times a day (QID) | INTRAMUSCULAR | Status: DC | PRN

## 2019-10-19 MED ORDER — FENTANYL CITRATE (PF) 100 MCG/2ML IJ SOLN
INTRAMUSCULAR | Status: DC | PRN
  Administered 2019-10-19: 25 ug via INTRAVENOUS

## 2019-10-19 MED ORDER — HEPARIN SODIUM (PORCINE) 1000 UNIT/ML IJ SOLN
INTRAMUSCULAR | Status: AC
  Filled 2019-10-19: qty 1

## 2019-10-19 MED ORDER — SODIUM CHLORIDE 0.9% FLUSH: 3.0000 mL | INTRAVENOUS | Status: DC | PRN

## 2019-10-19 MED ORDER — ACETAMINOPHEN 325 MG PO TABS: 650.0000 mg | ORAL_TABLET | ORAL | Status: DC | PRN

## 2019-10-19 MED ORDER — LABETALOL HCL 5 MG/ML IV SOLN: 10.0000 mg | INTRAVENOUS | Status: DC | PRN

## 2019-10-19 MED ORDER — HEPARIN (PORCINE) IN NACL 1000-0.9 UT/500ML-% IV SOLN
INTRAVENOUS | Status: DC | PRN
  Administered 2019-10-19: 500 mL

## 2019-10-19 MED ORDER — SODIUM CHLORIDE 0.9 % IV SOLN: 250.0000 mL | INTRAVENOUS | Status: DC | PRN

## 2019-10-19 MED ORDER — HEPARIN SODIUM (PORCINE) 1000 UNIT/ML IJ SOLN
INTRAMUSCULAR | Status: DC | PRN
  Administered 2019-10-19: 5000 [IU] via INTRAVENOUS

## 2019-10-19 MED ORDER — MIDAZOLAM HCL 2 MG/2ML IJ SOLN
INTRAMUSCULAR | Status: AC
  Filled 2019-10-19: qty 2

## 2019-10-19 MED ORDER — METFORMIN HCL 500 MG PO TABS: 500.0000 mg | ORAL_TABLET | Freq: Every day | ORAL | 1 refills | Status: DC

## 2019-10-19 MED ORDER — SODIUM CHLORIDE 0.9 % WEIGHT BASED INFUSION
1.0000 mL/kg/h | INTRAVENOUS | Status: DC
  Administered 2019-10-19: 1 mL/kg/h via INTRAVENOUS

## 2019-10-19 MED ORDER — MIDAZOLAM HCL 2 MG/2ML IJ SOLN
INTRAMUSCULAR | Status: DC | PRN
  Administered 2019-10-19: 1 mg via INTRAVENOUS

## 2019-10-19 MED ORDER — SODIUM CHLORIDE 0.9% FLUSH: 3.0000 mL | Freq: Two times a day (BID) | INTRAVENOUS | Status: DC

## 2019-10-19 MED ORDER — VERAPAMIL HCL 2.5 MG/ML IV SOLN
INTRAVENOUS | Status: AC
  Filled 2019-10-19: qty 2

## 2019-10-19 MED ORDER — SODIUM CHLORIDE 0.9 % WEIGHT BASED INFUSION
3.0000 mL/kg/h | INTRAVENOUS | Status: AC
  Administered 2019-10-19: 3 mL/kg/h via INTRAVENOUS

## 2019-10-19 MED ORDER — HYDRALAZINE HCL 20 MG/ML IJ SOLN: 10.0000 mg | INTRAMUSCULAR | Status: DC | PRN

## 2019-10-19 MED ORDER — FENTANYL CITRATE (PF) 100 MCG/2ML IJ SOLN
INTRAMUSCULAR | Status: AC
  Filled 2019-10-19: qty 2

## 2019-10-19 MED ORDER — IOHEXOL 300 MG/ML  SOLN
INTRAMUSCULAR | Status: DC | PRN
  Administered 2019-10-19: 45 mL

## 2019-10-19 MED ORDER — SODIUM CHLORIDE 0.9 % IV SOLN: INTRAVENOUS | Status: DC

## 2019-10-19 MED ORDER — HEPARIN (PORCINE) IN NACL 1000-0.9 UT/500ML-% IV SOLN
INTRAVENOUS | Status: AC
  Filled 2019-10-19: qty 1000

## 2019-10-19 SURGICAL SUPPLY — 11 items
CATH BALLN WEDGE 5F 110CM (CATHETERS) ×3 IMPLANT
CATH INFINITI 5 FR JL3.5 (CATHETERS) ×3 IMPLANT
CATH INFINITI JR4 5F (CATHETERS) ×3 IMPLANT
DEVICE RAD TR BAND REGULAR (VASCULAR PRODUCTS) ×3 IMPLANT
GLIDESHEATH SLEND SS 6F .021 (SHEATH) ×3 IMPLANT
GUIDEWIRE INQWIRE 1.5J.035X260 (WIRE) ×1 IMPLANT
INQWIRE 1.5J .035X260CM (WIRE) ×3
KIT MANI 3VAL PERCEP (MISCELLANEOUS) ×3 IMPLANT
PACK CARDIAC CATH (CUSTOM PROCEDURE TRAY) ×3 IMPLANT
SHEATH GLIDE SLENDER 4/5FR (SHEATH) ×3 IMPLANT
WIRE EMERALD ST .035X150CM (WIRE) ×3 IMPLANT

## 2019-10-19 NOTE — Interval H&P Note (Signed)
History and Physical Interval Note:  10/19/2019 7:26 AM  Diana Harmon  has presented today for surgery, with the diagnosis of aortic valve stenosis.  The various methods of treatment have been discussed with the patient and family. After consideration of risks, benefits and other options for treatment, the patient has consented to  Procedure(s): RIGHT/LEFT HEART CATH AND CORONARY ANGIOGRAPHY (N/A) as a surgical intervention.  The patient's history has been reviewed, patient examined, no change in status, stable for surgery.  I have reviewed the patient's chart and labs.  Questions were answered to the patient's satisfaction.    Cath Lab Visit (complete for each Cath Lab visit)  Clinical Evaluation Leading to the Procedure:   ACS: No.  Non-ACS:    Anginal/Heart Failure Classification: NYHA III  Anti-ischemic medical therapy: No Therapy  Non-Invasive Test Results: No non-invasive testing performed  Prior CABG: No previous CABG  [name]

## 2019-10-28 ENCOUNTER — Other Ambulatory Visit: Payer: Self-pay | Admitting: Internal Medicine

## 2019-10-28 ENCOUNTER — Other Ambulatory Visit: Payer: Self-pay

## 2019-10-28 ENCOUNTER — Telehealth: Payer: Self-pay | Admitting: General Practice

## 2019-10-28 ENCOUNTER — Encounter: Payer: Self-pay | Admitting: Cardiovascular Disease

## 2019-10-28 ENCOUNTER — Ambulatory Visit: Payer: Medicare Other | Admitting: Cardiovascular Disease

## 2019-10-28 ENCOUNTER — Ambulatory Visit (INDEPENDENT_AMBULATORY_CARE_PROVIDER_SITE_OTHER): Payer: Medicare Other | Admitting: General Practice

## 2019-10-28 VITALS — BP 124/70 | HR 84 | Ht 60.0 in | Wt 235.1 lb

## 2019-10-28 DIAGNOSIS — I5032 Chronic diastolic (congestive) heart failure: Secondary | ICD-10-CM | POA: Diagnosis not present

## 2019-10-28 DIAGNOSIS — E785 Hyperlipidemia, unspecified: Secondary | ICD-10-CM

## 2019-10-28 DIAGNOSIS — Z01812 Encounter for preprocedural laboratory examination: Secondary | ICD-10-CM | POA: Diagnosis not present

## 2019-10-28 DIAGNOSIS — E1169 Type 2 diabetes mellitus with other specified complication: Secondary | ICD-10-CM

## 2019-10-28 DIAGNOSIS — N182 Chronic kidney disease, stage 2 (mild): Secondary | ICD-10-CM | POA: Diagnosis not present

## 2019-10-28 DIAGNOSIS — I10 Essential (primary) hypertension: Secondary | ICD-10-CM

## 2019-10-28 DIAGNOSIS — I350 Nonrheumatic aortic (valve) stenosis: Secondary | ICD-10-CM

## 2019-10-28 LAB — BASIC METABOLIC PANEL
BUN/Creatinine Ratio: 22 (ref 12–28)
BUN: 22 mg/dL (ref 8–27)
CO2: 23 mmol/L (ref 20–29)
Calcium: 9.2 mg/dL (ref 8.7–10.3)
Chloride: 102 mmol/L (ref 96–106)
Creatinine, Ser: 1.02 mg/dL — ABNORMAL HIGH (ref 0.57–1.00)
GFR calc Af Amer: 63 mL/min/{1.73_m2} (ref >59)
GFR calc non Af Amer: 55 mL/min/{1.73_m2} — ABNORMAL LOW (ref >59)
Glucose: 204 mg/dL — ABNORMAL HIGH (ref 65–99)
Potassium: 4 mmol/L (ref 3.5–5.2)
Sodium: 138 mmol/L (ref 134–144)

## 2019-10-28 NOTE — Patient Instructions (Signed)
Medication Instructions:  No changes *If you need a refill on your cardiac medications before your next appointment, please call your pharmacy*   Lab Work: Today: BMET  If you have labs (blood work) drawn today and your tests are completely normal, you will receive your results only by: Marland Kitchen MyChart Message (if you have MyChart) OR . A paper copy in the mail If you have any lab test that is abnormal or we need to change your treatment, we will call you to review the results.   Testing/Procedures: Theodosia Quay, RN Structural Heart Nurse Navigator will be contacting you to arrange further testing.

## 2019-10-28 NOTE — Patient Instructions (Signed)
Visit Information  Goals Addressed              This Visit's Progress   .  COMPLETED: RNCM: Pt's daughter-"When can mom get the COVID 19 vaccine?" (pt-stated)        CARE PLAN ENTRY (see longtitudinal plan of care for additional care plan information)  Current Barriers: The patient states she and her daughter have received the vaccine.  . Knowledge Deficits related to COVID-19 and impact on patient self health management . Recent outbreak of shingles and the patient and patients daughter want to know when it is safe to take the COVID19 vaccine.   Clinical Goal(s):  Marland Kitchen Over the next 30 days, patient will verbalize basic understanding of COVID-19 impact on individual health and self health management as evidenced by verbalization of basic understanding of COVID-19 as a viral disease, measures to prevent exposure, signs and symptoms, when to contact provider  Interventions: . Advised patient to get COVID 19 vaccine at anytime per recommendations by Dr. Parks Ranger . Provided education to patient re: pharmacy's locally having the COVID 19 vaccine available . Collaborated with pcp and pharm D regarding Covid 19 vaccine and when it is safe for the patient to take after having shingles.   Patient Self Care Activities:  . Patient verbalizes understanding of plan to have the COVID 19 vaccine . Attends all scheduled provider appointments . Calls provider office for new concerns or questions  Initial goal documentation  For information about COVID-19 or "Corona Virus", the following web resources may be helpful:  CDC: BeginnerSteps.be    [place]:  InsuranceIntern.se                    .  COMPLETED: RNCM: Pt-"I thought a spider bit me, but I have shingles" (pt-stated)        CARE PLAN ENTRY (see longitudinal plan of care for additional care  plan information)  Current Barriers: Goal completed- the patient is doing well without further outbreak of shingles. . Knowledge Deficits related to etiology and pathophysiology of shingles . Care Coordination needs related to new diagnosis of shingles  in a patient with several chronic conditions (disease states) . Chronic Disease Management support and education needs related to shingles and management of shingles  Nurse Case Manager Clinical Goal(s):  Marland Kitchen Over the next 120 days, patient will verbalize understanding of plan for effectively treating shingles without any complications . Over the next 120 days, patient will work with [facility] and pcp to address needs related to Shingles outbreak on her shoulder, neck, ear and hairline . Over the next 30 days, patient will attend all scheduled medical appointments: front office staff to contact the patient to set up an appointment to see pcp . Over the next 120 days, patient will demonstrate improved adherence to prescribed treatment plan for shingles as evidenced byno further outbreaks   Interventions:  . Inter-disciplinary care team collaboration (see longitudinal plan of care) . Evaluation of current treatment plan related to shingles and patient's adherence to plan as established by provider. The patient is no longer experiencing issues with the shingles.  . Advised patient to call the pcp for worsening condition or spread of shingles.  Denies any new concerns related to shingles . Provided education to patient re: to do meticulous hand washing, not to touch eyes, and to see the provider for evaluation and further treatment of shingles. The patient states some areas are crusted over.  Education on how shingles spread  and being in the contagious state when there are blisters present. The patient states she is taking Aciclovir as directed but is almost completley finished. The patient is taking Tylenol every 4 hours to help with pain relief.  . Reviewed  medications with patient and discussed The patient is taking Aciclovir and Tylenol as directed  . Collaborated with pcp regarding outbreak of shingles and visit recently to the ER . Discussed plans with patient for ongoing care management follow up and provided patient with direct contact information for care management team . Education provided to the patient's daughter today about the patient being able to safely take the shingles vaccine around 10/16/2019 or after. Education and information provided from the pcp and  CCM pharmacist.  The patients daughter verbalized understanding. Also reminded the daughter of the patient possibly having pain, itching, or residual effects from having the shingles.  . Reviewed scheduled/upcoming provider appointments including: front office staff to call and get an appointment for the patient to come in and see pcp.   Patient Self Care Activities:  . Patient verbalizes understanding of plan to come in to see the pcp post ER visit for evaluation and follow up . Self administers medications as prescribed . Calls provider office for new concerns or questions . Unable to independently manage outbreak of shingles   Please see past updates related to this goal by clicking on the "Past Updates" button in the selected goal      .  RNCM: Pt-"I usually take my blood pressure daily" (pt-stated)        CARE PLAN ENTRY (see longtitudinal plan of care for additional care plan information)  Current Barriers:  . Chronic Disease Management support, education, and care coordination needs related to HTN, HLD, DMII, and CKD Stage 2  Clinical Goal(s) related to HTN, HLD, DMII, and CKD Stage 2 :  Over the next 120 days, patient will:  . Work with the care management team to address educational, disease management, and care coordination needs  . Begin or continue self health monitoring activities as directed today Measure and record cbg (blood glucose) 3 times a week (last  hemoglobin A1C 6.0), Measure and record blood pressure 3 times per week, and adhere to a heart healthy/ADA diet . Call provider office for new or worsened signs and symptoms Blood glucose findings outside established parameters, Blood pressure findings outside established parameters, Oxygen saturation lower than established parameter, Shortness of breath, and New or worsened symptom related to HLD, CKD2,  and other chronic conditions . Call care management team with questions or concerns . Verbalize basic understanding of patient centered plan of care established today  Interventions related to HTN, HLD, DMII, and CKD Stage 2 :  . Evaluation of current treatment plans and patient's adherence to plan as established by provider.  10-28-2019: The patient saw the specialist today and will proceed with TAVR.  The patient has several upcoming appointments pre TAVR. Discussed appointments with the patient and the patients daughter. There is some concern about appointments for 11-05-2019 but the patients daughter will talk to the nurse tomorrow about the schedule and expectations.  . Assessed patient understanding of disease states.  The patient has a good understanding of her conditions. Has an appointment on the 26th of May to talk to the cardiologist about next steps with a possible valve replacement. The patient had an ECHO on 08-19-2019 and her EF was 60-65%.  Explained to the patient what the EF % was.  The  patient takes her blood pressure regularly. Values given for when she had the house calls visit on 09-02-2019.  Explained this also to the patients daughter during the call. 10-28-2019: The patient saw specialist- will have several test run to prepare for TAVR with a goal of surgery within 4 weeks. The patients daughter reviewed with the RNCM the time line. Will assist as needed for questions and concerns.  . Assessed patient's education and care coordination needs.  The verbalized understanding of education  given today. The RNCM will contact the front office staff to establish and appointment for the patient to be seen by pcp.  . Provided disease specific education to patient.  Education and support on cardiac function and what the EF % means from her ECHO. The patient will see the cardiologist on 09-29-2019 for follow up from testing and recommendations  . Collaborated with appropriate clinical care team members regarding patient needs.  Patient denies any needs from the LCSW or pharmacist. Will continue to monitor. She knows CCM team is available to assist.  . Evaluation of the patient and daughter's understanding of upcoming procedures. The daughter and patient have a good understanding of the next steps to take. The patients blood sugars remain stable. The patients blood pressures are WNL.  The patient with have a couple of iron infusions before having valve replacement surgery. The patient is in good spirits and denies any acute needs at this time. Will continue to monitor and follow up accordingly.   Patient Self Care Activities related to HTN, HLD, DMII, and CKD Stage 2 :  . Patient is unable to independently self-manage chronic health conditions  Please see past updates related to this goal by clicking on the "Past Updates" button in the selected goal         Patient verbalizes understanding of instructions provided today.   The care management team will reach out to the patient again over the next 30 to 60 days.   Noreene Larsson RN, MSN, [place] Mobile: [phone]

## 2019-10-28 NOTE — Chronic Care Management (AMB) (Signed)
Chronic Care Management   Follow Up Note   10/28/2019 Name: Diana Harmon MRN: [id number] DOB: 1946-04-15  Referred by: [name], DO Reason for referral : Chronic Care Management (Follow up: RNCM: Chronic Disease Management and Care Coordination Needs )   Diana Harmon is a 74 y.o. year old female who is a primary care patient of [name], DO. The CCM team was consulted for assistance with chronic disease management and care coordination needs.    Review of patient status, including review of consultants reports, relevant laboratory and other test results, and collaboration with appropriate care team members and the patient's provider was performed as part of comprehensive patient evaluation and provision of chronic care management services.    SDOH (Social Determinants of Health) assessments performed: Yes See Care Plan activities for detailed interventions related to [facility])     Outpatient Encounter Medications as of 10/28/2019  Medication Sig Note   acetaminophen (TYLENOL) 500 MG tablet Take 1,000 mg by mouth every 6 (six) hours as needed for moderate pain.     Artificial Tear Solution (GENTEAL TEARS) 0.1-0.2-0.3 % SOLN Place 1 drop into both eyes 3 (three) times daily as needed (dry/irritated eyes.).    B COMPLEX-C PO Take 10 mLs by mouth daily.    baclofen (LIORESAL) 10 MG tablet TAKE 1 TO 2 TABLETS BY MOUTH TWICE DAILY AS NEEDED FOR MUSCLE SPASM    Blood Glucose Monitoring Suppl (ACCU-CHEK NANO SMARTVIEW) w/Device KIT Use to check blood glucose up to once daily as instructed    Cholecalciferol (VITAMIN D3) 125 MCG (5000 UT) TABS Take 5,000 Units by mouth daily.    Ferrous Sulfate (IRON) 325 (65 Fe) MG TABS TAKE 1 TABLET BY MOUTH TWICE DAILY WITH A MEAL (Patient taking differently: Take 325 mg by mouth in the morning and at bedtime. )    furosemide (LASIX) 20 MG tablet Take 1 tablet (20 mg total) by mouth daily as needed for  edema. (Patient taking differently: Take 20 mg by mouth daily as needed for edema (fluid retention). )    gabapentin (NEURONTIN) 100 MG capsule Start 1 capsule daily, increase by 1 cap every 2-3 days as tolerated up to 3 times a day, or may take 3 at once in evening. (Patient taking differently: Take 100-300 mg by mouth 3 (three) times daily as needed (shingles pain.). )    glucose blood (ACCU-CHEK SMARTVIEW) test strip Check blood glucose up to once daily as instructed    hydrochlorothiazide (HYDRODIURIL) 25 MG tablet Take 1 tablet by mouth once daily (Patient taking differently: Take 25 mg by mouth daily. )    lactobacillus acidophilus (BACID) TABS tablet Take 2 tablets by mouth 2 (two) times daily as needed (digestive health.).  10/17/2015: PRN   levocetirizine (XYZAL) 5 MG tablet Take 1 tablet (5 mg total) by mouth every evening. (Patient taking differently: Take 5 mg by mouth daily as needed (allergies.). )    losartan (COZAAR) 50 MG tablet Take 1 tablet by mouth once daily (Patient taking differently: Take 50 mg by mouth daily. )    metFORMIN (GLUCOPHAGE) 500 MG tablet Take 1 tablet (500 mg total) by mouth daily with supper.    mupirocin ointment (BACTROBAN) 2 % Apply 1 application topically 2 (two) times daily.    simvastatin (ZOCOR) 20 MG tablet TAKE 1 TABLET BY MOUTH ONCE DAILY AT 6 PM (Patient taking differently: Take 20 mg by mouth every evening. )    [DISCONTINUED] Cromolyn  Sodium (NASAL ALLERGY NA) Place 1 spray into the nose daily as needed (allergies).    No facility-administered encounter medications on file as of 10/28/2019.     Objective:  BP Readings from Last 3 Encounters:  10/28/19 124/70  10/19/19 (!) 147/71  10/12/19 134/62   Lab Results  Component Value Date   HGBA1C 6.2 (A) 01/19/2019    Goals Addressed              This Visit's Progress     COMPLETED: RNCM: Pt's daughter-"When can mom get the COVID 19 vaccine?" (pt-stated)        CARE PLAN  ENTRY (see longtitudinal plan of care for additional care plan information)  Current Barriers: The patient states she and her daughter have received the vaccine.   Knowledge Deficits related to COVID-19 and impact on patient self health management  Recent outbreak of shingles and the patient and patients daughter want to know when it is safe to take the COVID19 vaccine.   Clinical Goal(s):   Over the next 30 days, patient will verbalize basic understanding of COVID-19 impact on individual health and self health management as evidenced by verbalization of basic understanding of COVID-19 as a viral disease, measures to prevent exposure, signs and symptoms, when to contact provider  Interventions:  Advised patient to get COVID 19 vaccine at anytime per recommendations by Dr. Parks Ranger  Provided education to patient re: pharmacy's locally having the COVID 19 vaccine available  Collaborated with pcp and pharm D regarding Covid 19 vaccine and when it is safe for the patient to take after having shingles.   Patient Self Care Activities:   Patient verbalizes understanding of plan to have the COVID 19 vaccine  Attends all scheduled provider appointments  Calls provider office for new concerns or questions  Initial goal documentation  For information about COVID-19 or "Corona Virus", the following web resources may be helpful:  CDC: BeginnerSteps.be    [place]:  [email or web address]                      COMPLETED: RNCM: Pt-"I thought a spider bit me, but I have shingles" (pt-stated)        CARE PLAN ENTRY (see longitudinal plan of care for additional care plan information)  Current Barriers: Goal completed- the patient is doing well without further outbreak of shingles.  Knowledge Deficits related to etiology and pathophysiology of  shingles  Care Coordination needs related to new diagnosis of shingles  in a patient with several chronic conditions (disease states)  Chronic Disease Management support and education needs related to shingles and management of shingles  Nurse Case Manager Clinical Goal(s):   Over the next 120 days, patient will verbalize understanding of plan for effectively treating shingles without any complications  Over the next 120 days, patient will work with [facility] and pcp to address needs related to Shingles outbreak on her shoulder, neck, ear and hairline  Over the next 30 days, patient will attend all scheduled medical appointments: front office staff to contact the patient to set up an appointment to see pcp  Over the next 120 days, patient will demonstrate improved adherence to prescribed treatment plan for shingles as evidenced byno further outbreaks   Interventions:   Inter-disciplinary care team collaboration (see longitudinal plan of care)  Evaluation of current treatment plan related to shingles and patient's adherence to plan as established by provider. The patient is no longer experiencing issues with the shingles.  Advised patient to call the pcp for worsening condition or spread of shingles.  Denies any new concerns related to shingles  Provided education to patient re: to do meticulous hand washing, not to touch eyes, and to see the provider for evaluation and further treatment of shingles. The patient states some areas are crusted over.  Education on how shingles spread and being in the contagious state when there are blisters present. The patient states she is taking Aciclovir as directed but is almost completley finished. The patient is taking Tylenol every 4 hours to help with pain relief.   Reviewed medications with patient and discussed The patient is taking Aciclovir and Tylenol as directed   Collaborated with pcp regarding outbreak of shingles and visit recently to the  ER  Discussed plans with patient for ongoing care management follow up and provided patient with direct contact information for care management team  Education provided to the patient's daughter today about the patient being able to safely take the shingles vaccine around 10/16/2019 or after. Education and information provided from the pcp and  CCM pharmacist.  The patients daughter verbalized understanding. Also reminded the daughter of the patient possibly having pain, itching, or residual effects from having the shingles.   Reviewed scheduled/upcoming provider appointments including: front office staff to call and get an appointment for the patient to come in and see pcp.   Patient Self Care Activities:   Patient verbalizes understanding of plan to come in to see the pcp post ER visit for evaluation and follow up  Self administers medications as prescribed  Calls provider office for new concerns or questions  Unable to independently manage outbreak of shingles   Please see past updates related to this goal by clicking on the "Past Updates" button in the selected goal        RNCM: Pt-"I usually take my blood pressure daily" (pt-stated)        [place] (see longtitudinal plan of care for additional care plan information)  Current Barriers:   Chronic Disease Management support, education, and care coordination needs related to HTN, HLD, DMII, and CKD Stage 2  Clinical Goal(s) related to HTN, HLD, DMII, and CKD Stage 2 :  Over the next 120 days, patient will:   Work with the care management team to address educational, disease management, and care coordination needs   Begin or continue self health monitoring activities as directed today Measure and record cbg (blood glucose) 3 times a week (last hemoglobin A1C 6.0), Measure and record blood pressure 3 times per week, and adhere to a heart healthy/ADA diet  Call provider office for new or worsened signs and symptoms Blood  glucose findings outside established parameters, Blood pressure findings outside established parameters, Oxygen saturation lower than established parameter, Shortness of breath, and New or worsened symptom related to HLD, CKD2,  and other chronic conditions  Call care management team with questions or concerns  Verbalize basic understanding of patient centered plan of care established today  Interventions related to HTN, HLD, DMII, and CKD Stage 2 :   Evaluation of current treatment plans and patient's adherence to plan as established by provider.  10-28-2019: The patient saw the specialist today and will proceed with TAVR.  The patient has several upcoming appointments pre TAVR. Discussed appointments with the patient and the patients daughter. There is some concern about appointments for 11-05-2019 but the patients daughter will talk to the nurse tomorrow about the schedule and expectations.  Assessed patient understanding of disease states.  The patient has a good understanding of her conditions. Has an appointment on the 26th of May to talk to the cardiologist about next steps with a possible valve replacement. The patient had an ECHO on 08-19-2019 and her EF was 60-65%.  Explained to the patient what the EF % was.  The patient takes her blood pressure regularly. Values given for when she had the house calls visit on 09-02-2019.  Explained this also to the patients daughter during the call. 10-28-2019: The patient saw specialist- will have several test run to prepare for TAVR with a goal of surgery within 4 weeks. The patients daughter reviewed with the RNCM the time line. Will assist as needed for questions and concerns.   Assessed patient's education and care coordination needs.  The verbalized understanding of education given today. The RNCM will contact the front office staff to establish and appointment for the patient to be seen by pcp.   Provided disease specific education to patient.  Education  and support on cardiac function and what the EF % means from her ECHO. The patient will see the cardiologist on 09-29-2019 for follow up from testing and recommendations   Collaborated with appropriate clinical care team members regarding patient needs.  Patient denies any needs from the LCSW or pharmacist. Will continue to monitor. She knows CCM team is available to assist.   Evaluation of the patient and daughter's understanding of upcoming procedures. The daughter and patient have a good understanding of the next steps to take. The patients blood sugars remain stable. The patients blood pressures are WNL.  The patient with have a couple of iron infusions before having valve replacement surgery. The patient is in good spirits and denies any acute needs at this time. Will continue to monitor and follow up accordingly.   Patient Self Care Activities related to HTN, HLD, DMII, and CKD Stage 2 :   Patient is unable to independently self-manage chronic health conditions  Please see past updates related to this goal by clicking on the "Past Updates" button in the selected goal          Plan:   The care management team will reach out to the patient again over the next 30 to 60  days.    Noreene Larsson RN, MSN, [place] Mobile: [phone]

## 2019-10-28 NOTE — Progress Notes (Signed)
[facility] Consult Note  Chief Complaint  Patient presents with  . New Patient (Initial Visit)    Severe aortic stenosis   History of Present Illness: 74 yo Harmon with history of chronic systolic CHF, HTN, hyperlipidemia, diabetes mellitus, thyroid disease and severe aortic stenosis who is referred today as a new consult by Dr. Saunders Revel for further discussion regarding her aortic stenosis and possible TAVR. She has been followed for moderate aortic stenosis. She has recently endorsed progression of dyspnea on exertion. She has had some fluid retention and has been started on Lasix. Most recent echo 08/19/19 with LVEF=60-65%. The aortic valve is thickened and calcified with limited leaflet excursion. Mean gradient 32 mmHg, peak gradient 75 mmHg, AVA 0.69 cm2, dimensionless index 0.24. Mild aortic insufficiency.  Cardiac cath 10/19/19 with no evidence of CAD. Mean gradient at cath 29 mmHg. AVA 0.8 cm2.   She tells me today that she continues to have dyspnea with exertion. No dizziness, chest pain, near syncope or syncope. LE edema controlled with Lasix. She is limited by back pain and leg pain. She lives in [place], [place] with her daughter. She has partial dentures and no active dental issues. She is retired from working with handicapped adults.   She is followed at the [facility] in [place] for anemia. She has regular iron infusions. She was last seen there June 8th, 2021.   Primary Care Physician: [name], DO Primary Cardiologist: Nelva Bush Referring Cardiologist: Nelva Bush  Past Medical History:  Diagnosis Date  . Anemia   . Benign tumor   . Diabetes mellitus without complication ([place])   . Hypertension   . Nausea   . Patient is [name]   . Severe aortic stenosis   . Thyroid disease     Past Surgical History:  Procedure Laterality Date  . COLONOSCOPY    . COLONOSCOPY WITH PROPOFOL N/A 03/13/2015   Procedure: COLONOSCOPY  WITH PROPOFOL;  Surgeon: Manya Silvas, MD;  Location: [facility] ENDOSCOPY;  Service: Endoscopy;  Laterality: N/A;  . ESOPHAGOGASTRODUODENOSCOPY N/A 03/13/2015   Procedure: ESOPHAGOGASTRODUODENOSCOPY (EGD);  Surgeon: Manya Silvas, MD;  Location[facility] ENDOSCOPY;  Service: Endoscopy;  Laterality: N/A;  . RIGHT/LEFT HEART CATH AND CORONARY ANGIOGRAPHY N/A 10/19/2019   Procedure: RIGHT/LEFT HEART CATH AND CORONARY ANGIOGRAPHY;  Surgeon: Nelva Bush, MD;  Location: [facility];  Service: Cardiovascular;  Laterality: N/A;  . THYROIDECTOMY    . tumor removed      Current Outpatient Medications  Medication Sig Dispense Refill  . acetaminophen (TYLENOL) 500 MG tablet Take 1,000 mg by mouth every 6 (six) hours as needed for moderate pain.     . Artificial Tear Solution (GENTEAL TEARS) 0.1-0.2-0.3 % SOLN Place 1 drop into both eyes 3 (three) times daily as needed (dry/irritated eyes.).    Marland Kitchen B COMPLEX-C PO Take 10 mLs by mouth daily.    . baclofen (LIORESAL) 10 MG tablet TAKE 1 TO 2 TABLETS BY MOUTH TWICE DAILY AS NEEDED FOR MUSCLE SPASM 60 tablet 2  . Blood Glucose Monitoring Suppl (ACCU-CHEK NANO SMARTVIEW) w/Device KIT Use to check blood glucose up to once daily as instructed 1 kit 0  . Cholecalciferol (VITAMIN D3) 125 MCG (5000 UT) TABS Take 5,000 Units by mouth daily.    . Ferrous Sulfate (IRON) 325 (65 Fe) MG TABS TAKE 1 TABLET BY MOUTH TWICE DAILY WITH A MEAL (Patient taking differently: Take 325 mg by mouth in the morning and at bedtime. )  180 each 1  . furosemide (LASIX) 20 MG tablet Take 1 tablet (20 mg total) by mouth daily as needed for edema. (Patient taking differently: Take 20 mg by mouth daily as needed for edema (fluid retention). ) 45 tablet 6  . gabapentin (NEURONTIN) 100 MG capsule Start 1 capsule daily, increase by 1 cap every 2-3 days as tolerated up to 3 times a day, or may take 3 at once in evening. (Patient taking differently: Take 100-300 mg by mouth 3 (three) times  daily as needed (shingles pain.). ) 90 capsule 1  . glucose blood (ACCU-CHEK SMARTVIEW) test strip Check blood glucose up to once daily as instructed 100 each 5  . hydrochlorothiazide (HYDRODIURIL) 25 MG tablet Take 1 tablet by mouth once daily (Patient taking differently: Take 25 mg by mouth daily. ) 90 tablet 3  . lactobacillus acidophilus (BACID) TABS tablet Take 2 tablets by mouth 2 (two) times daily as needed (digestive health.).     Marland Kitchen levocetirizine (XYZAL) 5 MG tablet Take 1 tablet (5 mg total) by mouth every evening. (Patient taking differently: Take 5 mg by mouth daily as needed (allergies.). ) 90 tablet 3  . losartan (COZAAR) 50 MG tablet Take 1 tablet by mouth once daily (Patient taking differently: Take 50 mg by mouth daily. ) 90 tablet 1  . metFORMIN (GLUCOPHAGE) 500 MG tablet Take 1 tablet (500 mg total) by mouth daily with supper. 90 tablet 1  . mupirocin ointment (BACTROBAN) 2 % Apply 1 application topically 2 (two) times daily. 22 g 0  . simvastatin (ZOCOR) 20 MG tablet TAKE 1 TABLET BY MOUTH ONCE DAILY AT 6 PM (Patient taking differently: Take 20 mg by mouth every evening. ) 90 tablet 0   No current facility-administered medications for this visit.    Allergies  Allergen Reactions  . Ferumoxytol Swelling    Swelling in neck  . Other     Other reaction(s): Other (See Comments) She refuses blood products-Jehovah witness  . Penicillins Rash, Swelling and Hives    Social History   Socioeconomic History  . Marital status: Divorced    Spouse name: Not on file  . Number of children: 2  . Years of education: 56  . Highest education level: 12th grade  Occupational History  . Occupation: retired-worked with nandicapped adults  Tobacco Use  . Smoking status: Former Smoker    Years: 20.00    Types: Cigarettes    Quit date: 09/05/1979    Years since quitting: 40.1  . Smokeless tobacco: Former Network engineer  . Vaping Use: Never used  Substance and Sexual Activity  .  Alcohol use: Yes    Alcohol/week: 0.0 - 1.0 standard drinks    Comment: occ. wine  . Drug use: No  . Sexual activity: Not on file  Other Topics Concern  . Not on file  Social History Narrative  . Not on file   Social Determinants of Health   Financial Resource Strain: Low Risk   . Difficulty of Paying Living Expenses: Not hard at all  Food Insecurity: No Food Insecurity  . Worried About Charity fundraiser in the Last Year: Never true  . Ran Out of Food in the Last Year: Never true  Transportation Needs: No Transportation Needs  . Lack of Transportation (Medical): No  . Lack of Transportation (Non-Medical): No  Physical Activity: Inactive  . Days of Exercise per Week: 0 days  . Minutes of Exercise per Session: 0 min  Stress: No Stress Concern Present  . Feeling of Stress : Not at all  Social Connections: Moderately Integrated  . Frequency of Communication with Friends and Family: More than three times a week  . Frequency of Social Gatherings with Friends and Family: Never  . Attends Religious Services: More than 4 times per year  . Active Member of Clubs or Organizations: Yes  . Attends Archivist Meetings: More than 4 times per year  . Marital Status: Divorced  Human resources officer Violence: Not At Risk  . Fear of Current or Ex-Partner: No  . Emotionally Abused: No  . Physically Abused: No  . Sexually Abused: No    Family History  Problem Relation Age of Onset  . Parkinson's disease Mother   . Heart disease Father   . Lupus Sister   . Aneurysm Son   . Lupus Son   . Breast cancer Maternal Aunt        mat great aunt    Review of Systems:  As stated in the HPI and otherwise negative.   BP 124/70   Pulse 84   Ht 5' (1.524 m)   Wt 235 lb 1.9 oz (106.6 kg)   SpO2 98%   BMI 45.92 kg/m   Physical Examination: General: Well developed, well nourished, NAD  HEENT: OP clear, mucus membranes moist  SKIN: warm, dry. No rashes. Neuro: No focal deficits    Musculoskeletal: Muscle strength 5/5 all ext  Psychiatric: Mood and affect normal  Neck: No JVD, no carotid bruits, no thyromegaly, no lymphadenopathy.  Lungs:Clear bilaterally, no wheezes, rhonci, crackles Cardiovascular: Regular rate and rhythm. Loud, harsh, late peaking systolic murmur.  Abdomen:Soft. Bowel sounds present. Non-tender.  Extremities: No lower extremity edema. Pulses are 2 + in the bilateral DP/PT.  Echo 08/19/19: 1. Left ventricular ejection fraction, by estimation, is 60 to 65%. The  left ventricle has normal function. The left ventricle has no regional  wall motion abnormalities. There is moderate left ventricular hypertrophy.  Left ventricular diastolic  parameters are consistent with Grade I diastolic dysfunction (impaired  relaxation).  2. Right ventricular systolic function is normal. The right ventricular  size is normal.  3. Left atrial size was mildly dilated.  4. The aortic valve is normal in structure. Aortic valve regurgitation is  mild. Moderate to severe aortic valve stenosis visually and by mean  gradient. Aortic valve mean gradient measures 32.0 mmHg. Severe stenosis  by Aortic valve area, by VTI measures  0.85 cm and peak velocity 4.33 m/sec   FINDINGS  Left Ventricle: Left ventricular ejection fraction, by estimation, is 60  to 65%. The left ventricle has normal function. The left ventricle has no  regional wall motion abnormalities. Definity contrast agent was given IV  to delineate the left ventricular  endocardial borders. The left ventricular internal cavity size was normal  in size. There is moderate left ventricular hypertrophy. Left ventricular  diastolic parameters are consistent with Grade I diastolic dysfunction  (impaired relaxation).   Right Ventricle: The right ventricular size is normal. No increase in  right ventricular wall thickness. Right ventricular systolic function is  normal.   Left Atrium: Left atrial size was  mildly dilated.   Right Atrium: Right atrial size was normal in size.   Pericardium: There is no evidence of pericardial effusion.   Mitral Valve: The mitral valve is normal in structure. There is mild  thickening of the mitral valve leaflet(s). Normal mobility of the mitral  valve leaflets. No  evidence of mitral valve regurgitation. No evidence of  mitral valve stenosis.   Tricuspid Valve: The tricuspid valve is normal in structure. Tricuspid  valve regurgitation is not demonstrated. No evidence of tricuspid  stenosis.   Aortic Valve: The aortic valve is normal in structure. Aortic valve  regurgitation is mild. Aortic regurgitation PHT measures 440 msec.  Moderate to severe aortic stenosis is present. Aortic valve mean gradient  measures 32.0 mmHg. Aortic valve peak  gradient measures 75.0 mmHg. Aortic valve area, by VTI measures 0.85 cm.   Pulmonic Valve: The pulmonic valve was normal in structure. Pulmonic valve  regurgitation is trivial. No evidence of pulmonic stenosis.   Aorta: The aortic root is normal in size and structure.   Venous: The inferior vena cava is normal in size with greater than 50%  respiratory variability, suggesting right atrial pressure of 3 mmHg.   IAS/Shunts: No atrial level shunt detected by color flow Doppler.     LEFT VENTRICLE  PLAX 2D  LVIDd:     4.70 cm   Diastology  LVIDs:     3.40 cm   LV e' lateral:  5.98 cm/s  LV PW:     1.00 cm   LV E/e' lateral: 13.7  LV IVS:    1.30 cm   LV e' medial:  7.29 cm/s  LVOT diam:   2.10 cm   LV E/e' medial: 11.2  LV SV:     71  LV SV Index:  35  LVOT Area:   3.46 cm                 3D Volume EF:  LV Volumes (MOD)      3D EF:    66 %  LV vol d, MOD A4C: 94.2 ml LV EDV:    113 ml  LV vol s, MOD A4C: 39.5 ml LV ESV:    38 ml  LV SV MOD A4C:   94.2 ml LV SV:    75 ml   RIGHT VENTRICLE       IVC  RV S prime:   11.30 cm/s  IVC diam: 0.90 cm  TAPSE (M-mode): 2.2 cm   LEFT ATRIUM       Index    RIGHT ATRIUM      Index  LA diam:    4.00 cm 1.99 cm/m RA Area:   10.Diana cm  LA Vol (A2C):  42.5 ml 21.15 ml/m RA Volume:  21.Diana ml 10.85 ml/m  LA Vol (A4C):  44.0 ml 21.89 ml/m  LA Biplane Vol: 43.3 ml 21.54 ml/m  AORTIC VALVE          PULMONIC VALVE  AV Area (Vmax):  0.76 cm   PV Vmax:    1.13 m/s  AV Area (Vmean):  0.69 cm   PV Peak grad: 5.1 mmHg  AV Area (VTI):   0.85 cm  AV Vmax:      433.00 cm/s  AV Vmean:     295.000 cm/s  AV VTI:      0.835 m  AV Peak Grad:   75.0 mmHg  AV Mean Grad:   32.0 mmHg  LVOT Vmax:     95.30 cm/s  LVOT Vmean:    58.700 cm/s  LVOT VTI:     0.204 m  LVOT/AV VTI ratio: 0.24  AI PHT:      440 msec    AORTA  Ao Root diam: 2.90 cm  Ao Asc diam: 3.10 cm  Ao  Arch diam: 2.0 cm   MITRAL VALVE  MV Area (PHT): 3.13 cm   SHUNTS  MV Decel Time: 242 msec   Systemic VTI: 0.20 m  MV E velocity: 82.00 cm/s  Systemic Diam: 2.10 cm  MV A velocity: 103.00 cm/s  MV E/A ratio: 0.Diana   Cardiac cath 10/19/19: Conclusions: 1. No angiographically significant coronary artery disease. 2. Moderate to severe aortic stenosis (mean gradient 29 mmHg, AVA 0.8 cm^2). 3. Mildly elevated right heart filling pressure. 4. Upper normal to mildly elevated left heart filling pressure. 5. Normal Fick cardiac output/index.  Recommendations: 1. Refer to valve clinic for further workup/management of moderate to severe aortic stenosis. 2. Discontinue aspirin, given chronic anemia and lack of coronary artery disease. 3. Continue gentle diuresis. 4. Primary prevention of coronary artery disease.  Nelva Bush, MD [facility]  Recommendations  Antiplatelet/Anticoag No indication for antiplatelet therapy at this time .  Discharge Date In the absence of any other complications or medical issues,  we expect the patient to be ready for discharge from a cath perspective on 10/19/2019.  Indications  Nonrheumatic aortic valve stenosis [I35.0 (ICD-10-CM)]  Chronic heart failure with preserved ejection fraction (HFpEF) (HCC) [I50.32 (ICD-10-CM)]  Procedural Details  Technical Details Indication: 74 y.o. year-old woman with history of aortic stenosis, chronic HFpEF, hypertension, type 2 diabetes mellitus, chronic anemia, and morbid obesity, presenting for evaluation of aortic stenosis and HFpEF.  GFR: >60 ml/min  Procedure: The risks, benefits, complications, treatment options, and expected outcomes were discussed with the patient. The patient and/or family concurred with the proposed plan, giving informed consent. The patient was sedated with IV midazolam and fentanyl. The right wrist was assessed with a modified Allens test which was normal. The right wrist and elbow were prepped and draped in a sterile fashion. 1% lidocaine was used for local anesthesia. A previously placed antecubital vein IV was exchanged for a 59F slender Glidesheath using modified Seldinger technique. Right heart catheterization was performed by advancing a 59F balloon-tipped catheter through the right heart chambers into the pulmonary capillary wedge position. Pressure measurements and oxygen saturations were obtained.  Using the modified Seldinger access technique, a 90F slender Glidesheath was placed in the right radial artery. 3 mg Verapamil was given through the sheath. Heparin 5,000 units were administered.  Selective coronary angiography was performed using 59F JL3.5 and JR4 catheters to engage the left and right coronary arteries, respectively. Left heart catheterization was performed using a 59F JR4 catheter and straight wire to cross the aortic valve. Left ventriculogram was not performed.  At the end of the procedure, the radial artery sheath was removed and a TR band applied to achieve patent hemostasis. The right  antecubital vein sheath was removed and hemostasis achieved with manual compression.  There were no immediate complications. The patient was taken to the recovery area in stable condition.  Estimated blood loss <50 mL.   During this procedure medications were administered to achieve and maintain moderate conscious sedation while the patient's heart rate, blood pressure, and oxygen saturation were continuously monitored and I was present face-to-face 100% of this time.  Medications (Filter: Administrations occurring from 0749 to 0846 on 10/19/19) midazolam (VERSED) injection (mg) Total dose:  1 mg Date/Time  Rate/Dose/Volume Action  10/19/19 0757  1 mg Given    fentaNYL (SUBLIMAZE) injection (mcg) Total dose:  25 mcg Date/Time  Rate/Dose/Volume Action  10/19/19 0757  25 mcg Given    verapamil (ISOPTIN) injection (mg) Total dose:  2.5 mg  Date/Time  Rate/Dose/Volume Action  10/19/19 0817  2.5 mg Given    iohexol (OMNIPAQUE) 300 MG/ML solution (mL) Total volume:  45 mL Date/Time  Rate/Dose/Volume Action  10/19/19 0838  45 mL Given    heparin sodium (porcine) injection (Units) Total dose:  5,000 Units Date/Time  Rate/Dose/Volume Action  10/19/19 0820  5,000 Units Given    Heparin (Porcine) in NaCl 1000-0.9 UT/500ML-% SOLN (mL) Total volume:  500 mL Date/Time  Rate/Dose/Volume Action  10/19/19 0839  500 mL Given    Sedation Time  Sedation Time Physician-1: 39 minutes 13 seconds  Contrast  Medication Name Total Dose  iohexol (OMNIPAQUE) 300 MG/ML solution 45 mL    Radiation/Fluoro  Fluoro time: 4.2 (min) DAP: 28 (Gycm2) Cumulative Air Kerma: 338 (mGy)  Complications  Complications documented before study signed (10/19/2019 2:50 AM)   No complications were associated with this study.  Documented by Nelva Bush, MD - 10/19/2019 8:59 AM    Coronary Findings  Diagnostic Dominance: Co-dominant Left Main  Vessel is large. Vessel is angiographically normal.  Left  Anterior Descending  Vessel is large. Vessel is angiographically normal.  First Diagonal Branch  Vessel is small in size.  Second Diagonal Branch  Vessel is small in size.  Third Diagonal Branch  Vessel is moderate in size.  Ramus Intermedius  Vessel is small. Vessel is angiographically normal.  Left Circumflex  Vessel is large. Vessel is angiographically normal.  First Obtuse Marginal Branch  Vessel is large in size.  Second Obtuse Marginal Branch  Vessel is moderate in size.  First Left Posterolateral Branch  Vessel is small in size.  Second Left Posterolateral Branch  Vessel is small in size.  Third Left Posterolateral Branch  Vessel is small in size.  Right Coronary Artery  Vessel is moderate in size. Vessel is angiographically normal.  Right Posterior Descending Artery  Vessel is small in size.  Right Posterior Atrioventricular Artery  Vessel is small in size.  Intervention  No interventions have been documented. Right Heart  Right Heart Pressures RA (mean): 12 mmHg RV (S/EDP): 28/12 mmHg PA (S/D, mean): 28/17 (21) mmHg PCWP (mean): 15 mmHg  Ao sat: 99% PA sat: 72%  Fick CO: 5.2 L/min Fick CI: 2.6 L/min/m^2  Left Heart  Left Ventricle LV end diastolic pressure is mildly elevated. LVEDP ~20 mmHg.  Aortic Valve Moderate to severe aortic stenosis. Mean gradient: 29 mmHg. Calculated valve area: 0.8 cm^2.  Coronary Diagrams  Diagnostic Dominance: Co-dominant  Intervention  Implants   No implant documentation for this case.  Syngo Images  Show images for CARDIAC CATHETERIZATION Images on Long Term Storage  Show images for [place], [employer] [place] to Procedure Log  Procedure Log    Hemo Data (last day) before discharge   AO Systolic Cath Pressure  AO Diastolic Cath Pressure  AO Mean Cath Pressure  LV Systolic Cath Pressure  LV End Diastolic  LV Systolic  LV End Diastolic  LV dP/dt  PA Systolic Cath Pressure  PA Diastolic Cath Pressure  PA  Mean Cath Pressure  RA Wedge A Wave  RA Wedge V Wave  RV Systolic Cath Pressure  RV Diastolic Cath Pressure  RV End Diastolic  RV Systolic  RV End Diastolic  RV dP/dt  PCW A Wave  PCW V Wave  PCW Mean  AO O2 Sat  PA O2 Sat  AO O2 Sat  Fick C.O.  Fick C.I.   --  --  --  --  --  181 mmHg  22 mmHg  1536 mmHg/sec  --  --  --  11 mmHg  9 mmHg  --  --  --  26 mmHg  11 mmHg  288 mmHg/sec  13 mmHg  14 mmHg  12 mmHg  --  --  --  5.19 L/min  2.63 L/min/m2   --  --  --  --  --  --  --  --  --  --  --  --  --  26 mmHg  7 mmHg  11 mmHg  --  --  --  --  --  --  --  --  --  --  --   --  --  --  --  --  --  --  --  28 mmHg  16 mmHg  21 mmHg  --  --  --  --  --  --  --  --  --  --  --  --  --  --  --  --   141  71 mmHg  95 mmHg  --  --  --  --  --  --  --  --  --  --  --  --  --  --  --  --  --  --  --  --  --  --  --  --   --  --  --  181 mmHg  19 mmHg  --  --  --  --  --  --  --  --  --  --  --  --  --  --  --  --  --  --  --  --  --  --   --  --  --  144 mmHg  25 mmHg  --  --  --  --  --  --  --  --  --  --  --  --  --  --  --  --  --  --  --  --  --  --   --  --  --  173 mmHg  26 mmHg  --  --  --  --  --  --  --  --  --  --  --  --  --  --  --  --  --  --  --  --  --  --   --  --  --  181 mmHg  22 mmHg  --  --  --  --  --  --  --  --  --  --  --  --  --  --  --  --  --  --  --  --  --  --   153  75 mmHg  111 mmHg  --  --  --  --  --  --  --  --  --  --  --  --  --  --  --  --  --  --  --  --  --  --  --  --   --  --  --  --  --  --  --  --  --  --  --  --  --  --  --  --  --  --  --  --  --  --  99.8 %  --  SA  --  --   --  --  --  --  --  --  --  --  --  --  --  --  --  --  Recent Labs: 10/12/2019: BUN 26; Creatinine, Ser 1.07; Hemoglobin 9.1; Platelets 224; Potassium 4.0; Sodium 139    Wt Readings from Last 3 Encounters:  10/28/19 235 lb 1.9 oz (106.6 kg)  10/19/19 230 lb 9.6 oz (104.6 kg)  10/12/19 230 lb 8 oz (104.6 kg)     Other studies Reviewed: Additional studies/  records that were reviewed today include: cath images, echo images. Office notes Review of the above records demonstrates: severe AS   Assessment and Plan:   1. Severe Aortic Valve Stenosis: She has severe, stage D aortic valve stenosis. I have personally reviewed the echo images. The aortic valve is thickened, calcified with limited leaflet mobility. I think she would benefit from AVR. Given advanced age and the fact that she refuses blood products ([name]), she is not a good candidate for conventional AVR by surgical approach. I think she may be a good candidate for TAVR.   STS Risk Score: Risk of Mortality: 2.686% Renal Failure: 5.513% Permanent Stroke: 0.991% Prolonged Ventilation: 14.985% DSW Infection: 0.221% Reoperation: 2.837% Morbidity or Mortality: 19.874% Short Length of Stay: 22.224% Long Length of Stay: 10.422%   I have reviewed the natural history of aortic stenosis with the patient and their family members  who are present today. We have discussed the limitations of medical therapy and the poor prognosis associated with symptomatic aortic stenosis. We have reviewed potential treatment options, including palliative medical therapy, conventional surgical aortic valve replacement, and transcatheter aortic valve replacement. We discussed treatment options in the context of the patient's specific comorbid medical conditions.   She would like to proceed with planning for TAVR. Risks and benefits of the valve procedure are reviewed with the patient. We will arrange a cardiac CT, CTA of the chest/abdomen and pelvis, carotid artery dopplers, PT assessment and she will then be referred to see one of the CT surgeons on our TAVR team. BMET today.   I think she will need to consider repeating the iron infusion pre-TAVR. I will contact Dr. Rogue Bussing.      Current medicines are reviewed at length with the patient today.  The patient does not have concerns regarding  medicines.  The following changes have been made:  no change  Labs/ tests ordered today include:   Orders Placed This Encounter  Procedures  . Basic metabolic panel     Disposition:   FU with the valve team.    Signed, Lauree Chandler, MD 10/28/2019 10:33 AM    [facility] [place], [place], [place]  [address] Phone: [phone]; Fax: [phone]

## 2019-10-28 NOTE — Progress Notes (Signed)
C- schedule Venofer weekly x 2; starting next week.  Please add venofer to MD visit in sep,2021.   Thanks, GB

## 2019-10-29 ENCOUNTER — Other Ambulatory Visit: Payer: Self-pay

## 2019-10-29 MED ORDER — METOPROLOL TARTRATE 50 MG PO TABS
ORAL_TABLET | ORAL | 0 refills | Status: DC
Start: 2019-10-29 — End: 2019-11-10

## 2019-11-01 ENCOUNTER — Inpatient Hospital Stay: Payer: Medicare Other

## 2019-11-01 ENCOUNTER — Other Ambulatory Visit: Payer: Self-pay

## 2019-11-01 VITALS — BP 140/59 | HR 79 | Temp 98.1°F | Resp 20

## 2019-11-01 DIAGNOSIS — E538 Deficiency of other specified B group vitamins: Secondary | ICD-10-CM | POA: Diagnosis not present

## 2019-11-01 DIAGNOSIS — B029 Zoster without complications: Secondary | ICD-10-CM | POA: Diagnosis not present

## 2019-11-01 DIAGNOSIS — Z803 Family history of malignant neoplasm of breast: Secondary | ICD-10-CM | POA: Diagnosis not present

## 2019-11-01 DIAGNOSIS — E079 Disorder of thyroid, unspecified: Secondary | ICD-10-CM | POA: Diagnosis not present

## 2019-11-01 DIAGNOSIS — Z87891 Personal history of nicotine dependence: Secondary | ICD-10-CM | POA: Diagnosis not present

## 2019-11-01 DIAGNOSIS — D500 Iron deficiency anemia secondary to blood loss (chronic): Secondary | ICD-10-CM

## 2019-11-01 DIAGNOSIS — D508 Other iron deficiency anemias: Secondary | ICD-10-CM

## 2019-11-01 DIAGNOSIS — Z7982 Long term (current) use of aspirin: Secondary | ICD-10-CM | POA: Diagnosis not present

## 2019-11-01 DIAGNOSIS — I10 Essential (primary) hypertension: Secondary | ICD-10-CM | POA: Diagnosis not present

## 2019-11-01 DIAGNOSIS — I350 Nonrheumatic aortic (valve) stenosis: Secondary | ICD-10-CM | POA: Diagnosis not present

## 2019-11-01 DIAGNOSIS — Z7984 Long term (current) use of oral hypoglycemic drugs: Secondary | ICD-10-CM | POA: Diagnosis not present

## 2019-11-01 DIAGNOSIS — E119 Type 2 diabetes mellitus without complications: Secondary | ICD-10-CM | POA: Diagnosis not present

## 2019-11-01 DIAGNOSIS — D509 Iron deficiency anemia, unspecified: Secondary | ICD-10-CM | POA: Diagnosis not present

## 2019-11-01 DIAGNOSIS — Z79899 Other long term (current) drug therapy: Secondary | ICD-10-CM | POA: Diagnosis not present

## 2019-11-01 MED ORDER — SODIUM CHLORIDE 0.9% FLUSH
10.0000 mL | Freq: Once | INTRAVENOUS | Status: DC
  Filled 2019-11-01: qty 10

## 2019-11-01 MED ORDER — DIPHENHYDRAMINE HCL 50 MG/ML IJ SOLN
25.0000 mg | Freq: Once | INTRAMUSCULAR | Status: AC
  Administered 2019-11-01: 25 mg via INTRAVENOUS
  Filled 2019-11-01: qty 1

## 2019-11-01 MED ORDER — DEXAMETHASONE SODIUM PHOSPHATE 10 MG/ML IJ SOLN
4.0000 mg | Freq: Once | INTRAMUSCULAR | Status: AC
  Administered 2019-11-01: 4 mg via INTRAVENOUS
  Filled 2019-11-01: qty 1

## 2019-11-01 MED ORDER — IRON SUCROSE 20 MG/ML IV SOLN
200.0000 mg | Freq: Once | INTRAVENOUS | Status: AC
  Administered 2019-11-01: 200 mg via INTRAVENOUS
  Filled 2019-11-01: qty 10

## 2019-11-01 MED ORDER — SODIUM CHLORIDE 0.9 % IV SOLN
INTRAVENOUS | Status: DC
  Filled 2019-11-01: qty 250

## 2019-11-02 ENCOUNTER — Ambulatory Visit (HOSPITAL_COMMUNITY)
Admission: RE | Admit: 2019-11-02 | Discharge: 2019-11-02 | Disposition: A | Discharge status: Home / Self Care | Payer: Medicare Other | Source: Ambulatory Visit | Attending: Cardiovascular Disease | Admitting: Cardiovascular Disease

## 2019-11-02 DIAGNOSIS — I708 Atherosclerosis of other arteries: Secondary | ICD-10-CM | POA: Diagnosis not present

## 2019-11-02 DIAGNOSIS — I700 Atherosclerosis of aorta: Secondary | ICD-10-CM | POA: Diagnosis not present

## 2019-11-02 DIAGNOSIS — I350 Nonrheumatic aortic (valve) stenosis: Secondary | ICD-10-CM

## 2019-11-02 DIAGNOSIS — I771 Stricture of artery: Secondary | ICD-10-CM | POA: Diagnosis not present

## 2019-11-02 DIAGNOSIS — I358 Other nonrheumatic aortic valve disorders: Secondary | ICD-10-CM | POA: Diagnosis not present

## 2019-11-02 MED ORDER — IOHEXOL 350 MG/ML SOLN
100.0000 mL | Freq: Once | INTRAVENOUS | Status: AC | PRN
  Administered 2019-11-02: 100 mL via INTRAVENOUS

## 2019-11-03 ENCOUNTER — Other Ambulatory Visit: Payer: Self-pay

## 2019-11-03 DIAGNOSIS — I350 Nonrheumatic aortic (valve) stenosis: Secondary | ICD-10-CM

## 2019-11-04 ENCOUNTER — Ambulatory Visit: Payer: Medicare Other | Attending: Cardiovascular Disease | Admitting: Physical Therapy

## 2019-11-04 ENCOUNTER — Other Ambulatory Visit: Payer: Self-pay

## 2019-11-04 ENCOUNTER — Encounter: Payer: Self-pay | Admitting: Surgery

## 2019-11-04 ENCOUNTER — Encounter: Payer: Medicare Other | Admitting: Surgery

## 2019-11-04 ENCOUNTER — Encounter: Payer: Self-pay | Admitting: Physical Therapy

## 2019-11-04 ENCOUNTER — Institutional Professional Consult (permissible substitution): Payer: Medicare Other | Admitting: Surgery

## 2019-11-04 VITALS — BP 148/80 | HR 79 | Temp 98.5°F | Resp 18 | Ht 60.0 in | Wt 235.0 lb

## 2019-11-04 DIAGNOSIS — I350 Nonrheumatic aortic (valve) stenosis: Secondary | ICD-10-CM

## 2019-11-04 DIAGNOSIS — R6510 Systemic inflammatory response syndrome (SIRS) of non-infectious origin without acute organ dysfunction: Secondary | ICD-10-CM

## 2019-11-04 DIAGNOSIS — R262 Difficulty in walking, not elsewhere classified: Secondary | ICD-10-CM | POA: Insufficient documentation

## 2019-11-04 HISTORY — DX: Systemic inflammatory response syndrome (sirs) of non-infectious origin without acute organ dysfunction: R65.10

## 2019-11-04 NOTE — Progress Notes (Signed)
[place], [place] - [address] [address] Phone: [phone] Fax: [phone]      Your procedure is scheduled on Tuesday 11/09/19.  Report to [facility] Main Entrance "A" at 10:15 A.M., and check in at the Admitting office.  Call this number if you have problems the morning of surgery:  [phone]  Call [phone] if you have any questions prior to your surgery date Monday-Friday 8am-4pm    Remember:  Do not eat or drink after midnight the night before your surgery   Stop Metformin on 11/07/19   Continue taking all other current medications without change through the day before surgery.    On the morning of surgery do not take any medications   As of today, STOP taking any Aspirin (unless otherwise instructed by your surgeon) Aleve, Naproxen, Ibuprofen, Motrin, Advil, Goody's, BC's, all herbal medications, fish oil, and all vitamins.    HOW TO MANAGE YOUR DIABETES BEFORE AND AFTER SURGERY  Why is it important to control my blood sugar before and after surgery? . Improving blood sugar levels before and after surgery helps healing and can limit problems. . A way of improving blood sugar control is eating a healthy diet by: o  Eating less sugar and carbohydrates o  Increasing activity/exercise o  Talking with your doctor about reaching your blood sugar goals . High blood sugars (greater than 180 mg/dL) can raise your risk of infections and slow your recovery, so you will need to focus on controlling your diabetes during the weeks before surgery. . Make sure that the doctor who takes care of your diabetes knows about your planned surgery including the date and location.  How do I manage my blood sugar before surgery? . Check your blood sugar at least 4 times a day, starting 2 days before surgery, to make sure that the level is not too high or low. . Check your blood sugar the morning of your surgery when you wake up and  every 2 hours until you get to the Short Stay unit. o If your blood sugar is less than 70 mg/dL, you will need to treat for low blood sugar: - Do not take insulin. - Treat a low blood sugar (less than 70 mg/dL) with  cup of clear juice (cranberry or apple), 4 glucose tablets, OR glucose gel. - Recheck blood sugar in 15 minutes after treatment (to make sure it is greater than 70 mg/dL). If your blood sugar is not greater than 70 mg/dL on recheck, call [phone] for further instructions. . Report your blood sugar to the short stay nurse when you get to Short Stay.  . If you are admitted to the hospital after surgery: o Your blood sugar will be checked by the staff and you will probably be given insulin after surgery (instead of oral diabetes medicines) to make sure you have good blood sugar levels. o The goal for blood sugar control after surgery is 80-180 mg/dL.                       Do not wear jewelry, make up, or nail polish            Do not wear lotions, powders, colognes, or deodorant.            Do not shave 48 hours prior to surgery.  Men may shave face and neck.  Do not bring valuables to the hospital.            [facility] is not responsible for any belongings or valuables.  Do NOT Smoke (Tobacco/Vaping) or drink Alcohol 24 hours prior to your procedure  If you use a CPAP at night, you may bring all equipment for your overnight stay.   Contacts, glasses, dentures or bridgework may not be worn into surgery.      For patients admitted to the hospital, discharge time will be determined by your treatment team.   Patients discharged the day of surgery will not be allowed to drive home, and someone needs to stay with them for 24 hours.    Special instructions:   [place]- Preparing For Surgery  Before surgery, you can play an important role. Because skin is not sterile, your skin needs to be as free of germs as possible. You can reduce the number of germs on  your skin by washing with CHG (chlorahexidine gluconate) Soap before surgery.  CHG is an antiseptic cleaner which kills germs and bonds with the skin to continue killing germs even after washing.    Oral Hygiene is also important to reduce your risk of infection.  Remember - BRUSH YOUR TEETH THE MORNING OF SURGERY WITH YOUR REGULAR TOOTHPASTE  Please do not use if you have an allergy to CHG or antibacterial soaps. If your skin becomes reddened/irritated stop using the CHG.  Do not shave (including legs and underarms) for at least 48 hours prior to first CHG shower. It is OK to shave your face.  Please follow these instructions carefully.   1. Shower the NIGHT BEFORE SURGERY and the MORNING OF SURGERY with CHG Soap.   2. If you chose to wash your hair, wash your hair first as usual with your normal shampoo.  3. After you shampoo, rinse your hair and body thoroughly to remove the shampoo.  4. Use CHG as you would any other liquid soap. You can apply CHG directly to the skin and wash gently with a scrungie or a clean washcloth.   5. Apply the CHG Soap to your body ONLY FROM THE NECK DOWN.  Do not use on open wounds or open sores. Avoid contact with your eyes, ears, mouth and genitals (private parts). Wash Face and genitals (private parts)  with your normal soap.   6. Wash thoroughly, paying special attention to the area where your surgery will be performed.  7. Thoroughly rinse your body with warm water from the neck down.  8. DO NOT shower/wash with your normal soap after using and rinsing off the CHG Soap.  9. Pat yourself dry with a CLEAN TOWEL.  10. Wear CLEAN PAJAMAS to bed the night before surgery  11. Place CLEAN SHEETS on your bed the night of your first shower and DO NOT SLEEP WITH PETS.   Day of Surgery: Wear Clean/Comfortable clothing the morning of surgery Do not apply any deodorants/lotions.   Remember to brush your teeth WITH YOUR REGULAR TOOTHPASTE.   Please read over  the following fact sheets that you were given.

## 2019-11-04 NOTE — Progress Notes (Signed)
HEART AND [place] [facility]GERY CONSULTATION REPORT  Referring Provider is End, Harrell Gave, MD Primary Cardiologist is No primary care provider on file. PCP is Parks Ranger Devonne Doughty, DO  Chief Complaint  Patient presents with  . Aortic Stenosis    new patient consultation, TAVR, review all studies    HPI:  The patient is a 74 year old [name] with a history of hypertension, hyperlipidemia, diabetes, anemia on iron replacement therapy followed by Dr. Rogue Bussing at [facility] in [place] with regular iron infusions and aortic stenosis who presents with progressive exertional fatigue and shortness of breath.  2D echocardiogram in April 2019 showed mild to moderate aortic stenosis with a mean gradient of 21 mmHg and a peak gradient of 43 mmHg.  Aortic valve area at that time was 1.25 cm with a dimensionless index of 0.35.  A recent echocardiogram on 08/19/2019 showed an increase in the mean gradient to 32 mmHg with a peak gradient of 75 mmHg.  Aortic valve area had decreased to 0.85 cm consistent with severe aortic stenosis.  Left ventricular ejection fraction is 60 to 65%.  She underwent cardiac catheterization on 10/19/2019 showing no evidence of coronary disease.  The mean gradient by catheterization was 29 mmHg with an aortic valve area of 0.8 cm.  The patient is here today with her daughter.  She lives alone.  She has decreased mobility due to spine disease and uses a walker when at home.  When she is going outside the house she uses a wheelchair.  Past Medical History:  Diagnosis Date  . Anemia   . Benign tumor   . Diabetes mellitus without complication ([place])   . Hypertension   . Nausea   . Patient is [name]   . Severe aortic stenosis   . Thyroid disease     Past Surgical History:  Procedure Laterality Date  . COLONOSCOPY    . COLONOSCOPY WITH PROPOFOL N/A 03/13/2015   Procedure:  COLONOSCOPY WITH PROPOFOL;  Surgeon: Manya Silvas, MD;  Location: [facility] ENDOSCOPY;  Service: Endoscopy;  Laterality: N/A;  . ESOPHAGOGASTRODUODENOSCOPY N/A 03/13/2015   Procedure: ESOPHAGOGASTRODUODENOSCOPY (EGD);  Surgeon: Manya Silvas, MD;  Location: [facility] ENDOSCOPY;  Service: Endoscopy;  Laterality: N/A;  . RIGHT/LEFT HEART CATH AND CORONARY ANGIOGRAPHY N/A 10/19/2019   Procedure: RIGHT/LEFT HEART CATH AND CORONARY ANGIOGRAPHY;  Surgeon: Nelva Bush, MD;  Location: [facility] LAB;  Service: Cardiovascular;  Laterality: N/A;  . THYROIDECTOMY    . tumor removed      Family History  Problem Relation Age of Onset  . Parkinson's disease Mother   . Heart disease Father   . Lupus Sister   . Aneurysm Son   . Lupus Son   . Breast cancer Maternal Aunt        mat great aunt    Social History   Socioeconomic History  . Marital status: Divorced    Spouse name: Not on file  . Number of children: 2  . Years of education: 55  . Highest education level: 12th grade  Occupational History  . Occupation: retired-worked with nandicapped adults  Tobacco Use  . Smoking status: Former Smoker    Years: 20.00    Types: Cigarettes    Quit date: 09/05/1979    Years since quitting: 40.1  . Smokeless tobacco: Former Network engineer  . Vaping Use: Never used  Substance and Sexual Activity  . Alcohol use: Yes  Alcohol/week: 0.0 - 1.0 standard drinks    Comment: occ. wine  . Drug use: No  . Sexual activity: Not on file  Other Topics Concern  . Not on file  Social History Narrative  . Not on file   Social Determinants of Health   Financial Resource Strain: Low Risk   . Difficulty of Paying Living Expenses: Not hard at all  Food Insecurity: No Food Insecurity  . Worried About Charity fundraiser in the Last Year: Never true  . Ran Out of Food in the Last Year: Never true  Transportation Needs: No Transportation Needs  . Lack of Transportation (Medical): No  . Lack of  Transportation (Non-Medical): No  Physical Activity: Inactive  . Days of Exercise per Week: 0 days  . Minutes of Exercise per Session: 0 min  Stress: No Stress Concern Present  . Feeling of Stress : Not at all  Social Connections: Moderately Integrated  . Frequency of Communication with Friends and Family: More than three times a week  . Frequency of Social Gatherings with Friends and Family: Never  . Attends Religious Services: More than 4 times per year  . Active Member of Clubs or Organizations: Yes  . Attends Archivist Meetings: More than 4 times per year  . Marital Status: Divorced  Human resources officer Violence: Not At Risk  . Fear of Current or Ex-Partner: No  . Emotionally Abused: No  . Physically Abused: No  . Sexually Abused: No    Current Outpatient Medications  Medication Sig Dispense Refill  . acetaminophen (TYLENOL) 500 MG tablet Take 1,000 mg by mouth every 6 (six) hours as needed for moderate pain.     . Artificial Tear Solution (GENTEAL TEARS) 0.1-0.2-0.3 % SOLN Place 1 drop into both eyes 3 (three) times daily as needed (dry/irritated eyes.).    Marland Kitchen B COMPLEX-C PO Take 10 mLs by mouth daily.    . baclofen (LIORESAL) 10 MG tablet TAKE 1 TO 2 TABLETS BY MOUTH TWICE DAILY AS NEEDED FOR MUSCLE SPASM (Patient taking differently: Take 10-20 mg by mouth 2 (two) times daily as needed for muscle spasms. ) 60 tablet 2  . Blood Glucose Monitoring Suppl (ACCU-CHEK NANO SMARTVIEW) w/Device KIT Use to check blood glucose up to once daily as instructed 1 kit 0  . Cholecalciferol (VITAMIN D3) 125 MCG (5000 UT) TABS Take 5,000 Units by mouth daily.    . Ferrous Sulfate (IRON) 325 (65 Fe) MG TABS TAKE 1 TABLET BY MOUTH TWICE DAILY WITH A MEAL (Patient taking differently: Take 325 mg by mouth in the morning and at bedtime. ) 180 each 1  . furosemide (LASIX) 20 MG tablet Take 1 tablet (20 mg total) by mouth daily as needed for edema. (Patient taking differently: Take 20 mg by mouth  daily as needed for edema (fluid retention). ) 45 tablet 6  . gabapentin (NEURONTIN) 100 MG capsule Start 1 capsule daily, increase by 1 cap every 2-3 days as tolerated up to 3 times a day, or may take 3 at once in evening. (Patient taking differently: Take 100-300 mg by mouth 3 (three) times daily as needed (shingles pain.). ) 90 capsule 1  . glucose blood (ACCU-CHEK SMARTVIEW) test strip Check blood glucose up to once daily as instructed 100 each 5  . hydrochlorothiazide (HYDRODIURIL) 25 MG tablet Take 1 tablet by mouth once daily (Patient taking differently: Take 25 mg by mouth daily. ) 90 tablet 3  . lactobacillus acidophilus (BACID) TABS  tablet Take 2 tablets by mouth 2 (two) times daily as needed (digestive health.).     Marland Kitchen levocetirizine (XYZAL) 5 MG tablet Take 1 tablet (5 mg total) by mouth every evening. (Patient taking differently: Take 5 mg by mouth daily as needed (allergies.). ) 90 tablet 3  . losartan (COZAAR) 50 MG tablet Take 1 tablet by mouth once daily (Patient taking differently: Take 50 mg by mouth daily. ) 90 tablet 1  . metFORMIN (GLUCOPHAGE) 500 MG tablet Take 1 tablet (500 mg total) by mouth daily with supper. 90 tablet 1  . metoprolol tartrate (LOPRESSOR) 50 MG tablet Take as directed prior to pre TAVR CT scans 1 tablet 0  . mupirocin ointment (BACTROBAN) 2 % Apply 1 application topically 2 (two) times daily. 22 g 0  . simvastatin (ZOCOR) 20 MG tablet TAKE 1 TABLET BY MOUTH ONCE DAILY AT 6 PM (Patient taking differently: Take 20 mg by mouth every evening. ) 90 tablet 0   No current facility-administered medications for this visit.    Allergies  Allergen Reactions  . Ferumoxytol Swelling    Swelling in neck  . Other     Other reaction(s): Other (See Comments) She refuses blood products-Jehovah witness  . Penicillins Rash, Swelling and Hives      Review of Systems:   General:  normal appetite, + decreased energy, no weight gain, no weight loss, no  fever  Cardiac:  no chest pain with exertion, no chest pain at rest, +SOB with mild exertion, no resting SOB, no PND, + orthopnea, no palpitations, no arrhythmia, no atrial fibrillation, + LE edema, no dizzy spells, no syncope  Respiratory:  + exertional shortness of breath, no home oxygen, no productive cough, + dry cough, no bronchitis, no wheezing, no hemoptysis, no asthma, no pain with inspiration or cough, no sleep apnea, no CPAP at night  GI:   no difficulty swallowing, no reflux, no frequent heartburn, no hiatal hernia, no abdominal pain, no constipation, no diarrhea, no hematochezia, no hematemesis, no melena  GU:   no dysuria,  no frequency, no urinary tract infection, no hematuria, no kidney stones, no kidney disease  Vascular:  no pain suggestive of claudication, no pain in feet, no leg cramps, no varicose veins, no DVT, no non-healing foot ulcer  Neuro:   no stroke, no TIA's, no seizures, no headaches, no temporary blindness one eye,  no slurred speech, no peripheral neuropathy, + chronic pain, + instability of gait, no memory/cognitive dysfunction  Musculoskeletal: + arthritis, + joint swelling, + myalgias, + difficulty walking, + reduced mobility   Skin:   no rash, no itching, no skin infections, no pressure sores or ulcerations  Psych:   no anxiety, no depression, no nervousness, no unusual recent stress  Eyes:   + blurry vision, + floaters, no recent vision changes, + wears glasses or contacts  ENT:   no hearing loss, no loose or painful teeth, no dentures, last saw dentist every 6 months  Hematologic:  no easy bruising, no abnormal bleeding, no clotting disorder, no frequent epistaxis  Endocrine:  + diabetes, does  check CBG's at home     Physical Exam:   BP (!) 148/80 (BP Location: Left Arm, Patient Position: Sitting, Cuff Size: Large)   Pulse 79   Temp 98.5 F (36.9 C)   Resp 18   Ht 5' (1.524 m)   Wt 235 lb (106.6 kg)   SpO2 96% Comment: RA  BMI 45.90 kg/m    General:  well-appearing in wheel chair  HEENT:  Unremarkable, NCAT, PERLA, EOMI  Neck:   no JVD, no bruits, no adenopathy   Chest:   clear to auscultation, symmetrical breath sounds, no wheezes, no rhonchi   CV:   RRR, grade lll/VI crescendo/decrescendo murmur heard best at RSB,  no diastolic murmur  Abdomen:  soft, non-tender, no masses   Extremities:  warm, well-perfused, pulses not palpable, moderate LE edema bilaterally.  Rectal/GU  Deferred  Neuro:   Grossly non-focal and symmetrical throughout  Skin:   Clean and dry, no rashes, no breakdown   Diagnostic Tests:  ECHOCARDIOGRAM REPORT       Patient Name:  Diana Harmon Date of Exam: 08/19/2019  Medical Rec #: [id number]        Height:    62.0 in  Accession #:  [id number]       Weight:    225.0 lb  Date of Birth: [birth year]        BSA:     2.010 m  Patient Age:  85 years        BP:      146/84 mmHg  Patient Gender: F            HR:      80 bpm.  Exam Location: [place]   Procedure: 2D Echo, Cardiac Doppler, Color Doppler and Intracardiac       Opacification Agent   Indications:  I35.0 Nonrheumatic aortic (valve) stenosis    History:    Patient has prior history of Echocardiogram examinations,  most         recent 08/28/2017. TIA, Aortic Valve Disease; Risk         Factors:Hypertension, Diabetes, Dyslipidemia and Former  Smoker.    Sonographer:  Pilar Jarvis RDMS, RVT, RDCS  Referring Phys: [place]    1. Left ventricular ejection fraction, by estimation, is 60 to 65%. The  left ventricle has normal function. The left ventricle has no regional  wall motion abnormalities. There is moderate left ventricular hypertrophy.  Left ventricular diastolic  parameters are consistent with Grade I diastolic dysfunction (impaired  relaxation).  2. Right ventricular systolic function is  normal. The right ventricular  size is normal.  3. Left atrial size was mildly dilated.  4. The aortic valve is normal in structure. Aortic valve regurgitation is  mild. Moderate to severe aortic valve stenosis visually and by mean  gradient. Aortic valve mean gradient measures 32.0 mmHg. Severe stenosis  by Aortic valve area, by VTI measures  0.85 cm and peak velocity 4.33 m/sec   FINDINGS  Left Ventricle: Left ventricular ejection fraction, by estimation, is 60  to 65%. The left ventricle has normal function. The left ventricle has no  regional wall motion abnormalities. Definity contrast agent was given IV  to delineate the left ventricular  endocardial borders. The left ventricular internal cavity size was normal  in size. There is moderate left ventricular hypertrophy. Left ventricular  diastolic parameters are consistent with Grade I diastolic dysfunction  (impaired relaxation).   Right Ventricle: The right ventricular size is normal. No increase in  right ventricular wall thickness. Right ventricular systolic function is  normal.   Left Atrium: Left atrial size was mildly dilated.   Right Atrium: Right atrial size was normal in size.   Pericardium: There is no evidence of pericardial effusion.   Mitral Valve: The mitral valve is normal in structure. There is mild  thickening of the mitral  valve leaflet(s). Normal mobility of the mitral  valve leaflets. No evidence of mitral valve regurgitation. No evidence of  mitral valve stenosis.   Tricuspid Valve: The tricuspid valve is normal in structure. Tricuspid  valve regurgitation is not demonstrated. No evidence of tricuspid  stenosis.   Aortic Valve: The aortic valve is normal in structure. Aortic valve  regurgitation is mild. Aortic regurgitation PHT measures 440 msec.  Moderate to severe aortic stenosis is present. Aortic valve mean gradient  measures 32.0 mmHg. Aortic valve peak  gradient measures 75.0 mmHg.  Aortic valve area, by VTI measures 0.85 cm.   Pulmonic Valve: The pulmonic valve was normal in structure. Pulmonic valve  regurgitation is trivial. No evidence of pulmonic stenosis.   Aorta: The aortic root is normal in size and structure.   Venous: The inferior vena cava is normal in size with greater than 50%  respiratory variability, suggesting right atrial pressure of 3 mmHg.   IAS/Shunts: No atrial level shunt detected by color flow Doppler.     LEFT VENTRICLE  PLAX 2D  LVIDd:     4.70 cm   Diastology  LVIDs:     3.40 cm   LV e' lateral:  5.98 cm/s  LV PW:     1.00 cm   LV E/e' lateral: 13.7  LV IVS:    1.30 cm   LV e' medial:  7.29 cm/s  LVOT diam:   2.10 cm   LV E/e' medial: 11.2  LV SV:     71  LV SV Index:  35  LVOT Area:   3.46 cm                 3D Volume EF:  LV Volumes (MOD)      3D EF:    66 %  LV vol d, MOD A4C: 94.2 ml LV EDV:    113 ml  LV vol s, MOD A4C: 39.5 ml LV ESV:    38 ml  LV SV MOD A4C:   94.2 ml LV SV:    75 ml   RIGHT VENTRICLE       IVC  RV S prime:   11.30 cm/s IVC diam: 0.90 cm  TAPSE (M-mode): 2.2 cm   LEFT ATRIUM       Index    RIGHT ATRIUM      Index  LA diam:    4.00 cm 1.99 cm/m RA Area:   10.80 cm  LA Vol (A2C):  42.5 ml 21.15 ml/m RA Volume:  21.80 ml 10.85 ml/m  LA Vol (A4C):  44.0 ml 21.89 ml/m  LA Biplane Vol: 43.3 ml 21.54 ml/m  AORTIC VALVE          PULMONIC VALVE  AV Area (Vmax):  0.76 cm   PV Vmax:    1.13 m/s  AV Area (Vmean):  0.69 cm   PV Peak grad: 5.1 mmHg  AV Area (VTI):   0.85 cm  AV Vmax:      433.00 cm/s  AV Vmean:     295.000 cm/s  AV VTI:      0.835 m  AV Peak Grad:   75.0 mmHg  AV Mean Grad:   32.0 mmHg  LVOT Vmax:     95.30 cm/s  LVOT Vmean:    58.700 cm/s  LVOT VTI:     0.204 m  LVOT/AV VTI ratio: 0.24  AI PHT:      440 msec     AORTA  Ao  Root diam: 2.90 cm  Ao Asc diam: 3.10 cm  Ao Arch diam: 2.0 cm   MITRAL VALVE  MV Area (PHT): 3.13 cm   SHUNTS  MV Decel Time: 242 msec   Systemic VTI: 0.20 m  MV E velocity: 82.00 cm/s  Systemic Diam: 2.10 cm  MV A velocity: 103.00 cm/s  MV E/A ratio: 0.80   Ida Rogue MD  Electronically signed by Ida Rogue MD  Signature Date/Time: 08/20/2019/6:08:22 PM      Physicians  Panel Physicians Referring Physician Case Authorizing Physician  End, Harrell Gave, MD (Primary)    Procedures  RIGHT/LEFT HEART CATH AND CORONARY ANGIOGRAPHY  Conclusion  Conclusions: 1. No angiographically significant coronary artery disease. 2. Moderate to severe aortic stenosis (mean gradient 29 mmHg, AVA 0.8 cm^2). 3. Mildly elevated right heart filling pressure. 4. Upper normal to mildly elevated left heart filling pressure. 5. Normal Fick cardiac output/index.  Recommendations: 1. Refer to valve clinic for further workup/management of moderate to severe aortic stenosis. 2. Discontinue aspirin, given chronic anemia and lack of coronary artery disease. 3. Continue gentle diuresis. 4. Primary prevention of coronary artery disease.  Nelva Bush, MD [facility]  Recommendations  Antiplatelet/Anticoag No indication for antiplatelet therapy at this time .  Discharge Date In the absence of any other complications or medical issues, we expect the patient to be ready for discharge from a cath perspective on 10/19/2019.  Indications  Nonrheumatic aortic valve stenosis [I35.0 (ICD-10-CM)]  Chronic heart failure with preserved ejection fraction (HFpEF) (HCC) [I50.32 (ICD-10-CM)]  Procedural Details  Technical Details Indication: 74 y.o. year-old woman with history of aortic stenosis, chronic HFpEF, hypertension, type 2 diabetes mellitus, chronic anemia, and morbid obesity, presenting for evaluation of aortic stenosis and HFpEF.  GFR: >60 ml/min  Procedure:  The risks, benefits, complications, treatment options, and expected outcomes were discussed with the patient. The patient and/or family concurred with the proposed plan, giving informed consent. The patient was sedated with IV midazolam and fentanyl. The right wrist was assessed with a modified Allens test which was normal. The right wrist and elbow were prepped and draped in a sterile fashion. 1% lidocaine was used for local anesthesia. A previously placed antecubital vein IV was exchanged for a 90F slender Glidesheath using modified Seldinger technique. Right heart catheterization was performed by advancing a 90F balloon-tipped catheter through the right heart chambers into the pulmonary capillary wedge position. Pressure measurements and oxygen saturations were obtained.  Using the modified Seldinger access technique, a 18F slender Glidesheath was placed in the right radial artery. 3 mg Verapamil was given through the sheath. Heparin 5,000 units were administered.  Selective coronary angiography was performed using 90F JL3.5 and JR4 catheters to engage the left and right coronary arteries, respectively. Left heart catheterization was performed using a 90F JR4 catheter and straight wire to cross the aortic valve. Left ventriculogram was not performed.  At the end of the procedure, the radial artery sheath was removed and a TR band applied to achieve patent hemostasis. The right antecubital vein sheath was removed and hemostasis achieved with manual compression.  There were no immediate complications. The patient was taken to the recovery area in stable condition.  Estimated blood loss <50 mL.   During this procedure medications were administered to achieve and maintain moderate conscious sedation while the patient's heart rate, blood pressure, and oxygen saturation were continuously monitored and I was present face-to-face 100% of this time.  Medications (Filter: Administrations occurring from 0749 to [phone] on  10/19/19) midazolam (VERSED) injection (mg) Total dose:  1 mg Date/Time  Rate/Dose/Volume Action  10/19/19 0757  1 mg Given    fentaNYL (SUBLIMAZE) injection (mcg) Total dose:  25 mcg Date/Time  Rate/Dose/Volume Action  10/19/19 0757  25 mcg Given    verapamil (ISOPTIN) injection (mg) Total dose:  2.5 mg Date/Time  Rate/Dose/Volume Action  10/19/19 0817  2.5 mg Given    iohexol (OMNIPAQUE) 300 MG/ML solution (mL) Total volume:  45 mL Date/Time  Rate/Dose/Volume Action  10/19/19 0838  45 mL Given    heparin sodium (porcine) injection (Units) Total dose:  5,000 Units Date/Time  Rate/Dose/Volume Action  10/19/19 0820  5,000 Units Given    Heparin (Porcine) in NaCl 1000-0.9 UT/500ML-% SOLN (mL) Total volume:  500 mL Date/Time  Rate/Dose/Volume Action  10/19/19 0839  500 mL Given    Sedation Time  Sedation Time Physician-1: 39 minutes 13 seconds  Contrast  Medication Name Total Dose  iohexol (OMNIPAQUE) 300 MG/ML solution 45 mL    Radiation/Fluoro  Fluoro time: 4.2 (min) DAP: 28 (Gycm2) Cumulative Air Kerma: 315 (mGy)  Complications  Complications documented before study signed (10/19/2019 4:00 AM)   No complications were associated with this study.  Documented by Nelva Bush, MD - 10/19/2019 8:59 AM    Coronary Findings  Diagnostic Dominance: Co-dominant Left Main  Vessel is large. Vessel is angiographically normal.  Left Anterior Descending  Vessel is large. Vessel is angiographically normal.  First Diagonal Branch  Vessel is small in size.  Second Diagonal Branch  Vessel is small in size.  Third Diagonal Branch  Vessel is moderate in size.  Ramus Intermedius  Vessel is small. Vessel is angiographically normal.  Left Circumflex  Vessel is large. Vessel is angiographically normal.  First Obtuse Marginal Branch  Vessel is large in size.  Second Obtuse Marginal Branch  Vessel is moderate in size.  First Left Posterolateral Branch  Vessel is  small in size.  Second Left Posterolateral Branch  Vessel is small in size.  Third Left Posterolateral Branch  Vessel is small in size.  Right Coronary Artery  Vessel is moderate in size. Vessel is angiographically normal.  Right Posterior Descending Artery  Vessel is small in size.  Right Posterior Atrioventricular Artery  Vessel is small in size.  Intervention  No interventions have been documented. Right Heart  Right Heart Pressures RA (mean): 12 mmHg RV (S/EDP): 28/12 mmHg PA (S/D, mean): 28/17 (21) mmHg PCWP (mean): 15 mmHg  Ao sat: 99% PA sat: 72%  Fick CO: 5.2 L/min Fick CI: 2.6 L/min/m^2  Left Heart  Left Ventricle LV end diastolic pressure is mildly elevated. LVEDP ~20 mmHg.  Aortic Valve Moderate to severe aortic stenosis. Mean gradient: 29 mmHg. Calculated valve area: 0.8 cm^2.  Coronary Diagrams  Diagnostic Dominance: Co-dominant  Intervention  Implants   No implant documentation for this case.  Syngo Images  Show images for CARDIAC CATHETERIZATION Images on Long Term Storage  Show images for Tyler, Jalaila Caradonna to Procedure Log  Procedure Log    Hemo Data (last day) before discharge   AO Systolic Cath Pressure  AO Diastolic Cath Pressure  AO Mean Cath Pressure  LV Systolic Cath Pressure  LV End Diastolic  LV Systolic  LV End Diastolic  LV dP/dt  PA Systolic Cath Pressure  PA Diastolic Cath Pressure  PA Mean Cath Pressure  RA Wedge A Wave  RA Wedge V Wave  RV Systolic Cath Pressure  RV Diastolic Cath Pressure  RV End Diastolic  RV Systolic  RV End Diastolic  RV dP/dt  PCW A Wave  PCW V Wave  PCW Mean  AO O2 Sat  PA O2 Sat  AO O2 Sat  Fick C.O.  Fick C.I.   --  --  --  --  --  181 mmHg  22 mmHg  1536 mmHg/sec  --  --  --  11 mmHg  9 mmHg  --  --  --  26 mmHg  11 mmHg  288 mmHg/sec  13 mmHg  14 mmHg  12 mmHg  --  --  --  5.19 L/min  2.63 L/min/m2   --  --  --  --  --  --  --  --  --  --  --  --  --  26 mmHg  7 mmHg  11 mmHg  --  --  --  --   --  --  --  --  --  --  --   --  --  --  --  --  --  --  --  28 mmHg  16 mmHg  21 mmHg  --  --  --  --  --  --  --  --  --  --  --  --  --  --  --  --   141  71 mmHg  95 mmHg  --  --  --  --  --  --  --  --  --  --  --  --  --  --  --  --  --  --  --  --  --  --  --  --   --  --  --  181 mmHg  19 mmHg  --  --  --  --  --  --  --  --  --  --  --  --  --  --  --  --  --  --  --  --  --  --   --  --  --  144 mmHg  25 mmHg  --  --  --  --  --  --  --  --  --  --  --  --  --  --  --  --  --  --  --  --  --  --   --  --  --  173 mmHg  26 mmHg  --  --  --  --  --  --  --  --  --  --  --  --  --  --  --  --  --  --  --  --  --  --   --  --  --  181 mmHg  22 mmHg  --  --  --  --  --  --  --  --  --  --  --  --  --  --  --  --  --  --  --  --  --  --   153  75 mmHg  111 mmHg  --  --  --  --  --  --  --  --  --  --  --  --  --  --  --  --  --  --  --  --  --  --  --  --   --  --  --  --  --  --  --  --  --  --  --  --  --  --  --  --  --  --  --  --  --  --  99.8 %  --  SA  --  --   --  --  --  --  --  --  --                                            ADDENDUM REPORT: 11/02/2019 17:55  CLINICAL DATA:  Severe Aortic Stenosis.  EXAM: Cardiac TAVR CT  TECHNIQUE: The patient was scanned on a [employer]. A 120 kV retrospective scan was triggered in the descending thoracic aorta at 111 HU's. Gantry rotation speed was 250 msecs and collimation was .6 mm. No beta blockade or nitro were given. The 3D data set was reconstructed in 5% intervals of the R-R cycle. Systolic and diastolic phases were analyzed on a dedicated work station using MPR, MIP and VRT modes. The patient received 80 cc of contrast.  FINDINGS: Image quality: Excellent.  Noise artifact is: Limited.  Valve Morphology: The aortic valve is tricuspid. There is asymmetric bulky calcification of the LCC extending to the leaflet base. The leaflets exhibit severely restricted leaflet motion in systole consistent with severe  aortic stenosis.  Aortic Valve Calcium score: 1548  Aortic annular dimension:  Phase assessed: 15%  Annular area: 441 mm2  Annular perimeter: 75.9 mm  Max diameter: 26.5 mm  Min diameter: 23.2 mm  Annular and subannular calcification: No significant annular or subannular calcifications.  Optimal coplanar projection: LAO 10 CRA 1  Coronary Artery Height above Annulus:  Left Main: 14.1 mm  Right Coronary: 16.7 mm  Sinus of Valsalva Measurements:  Non-coronary: 30 mm  Right-coronary: 29 mm  Left-coronary: 31 mm  Sinus of Valsalva Height:  Non-coronary: 20.9 mm  Right-coronary: 21.7 mm  Left-coronary: 19.8 mm  Sinotubular Junction: 30 mm.  Mild calcified plaque.  Ascending Thoracic Aorta: 32 mm.  Coronary Arteries: Normal coronary origin. Right dominance. The study was performed without use of NTG and is insufficient for plaque evaluation. Please refer to recent cardiac catheterization for coronary assessment.  Cardiac Morphology:  Right Atrium: Right atrial size is within normal limits.  Right Ventricle: The right ventricular cavity is within normal limits.  Left Atrium: Left atrial size is normal in size with no left atrial appendage filling defect.  Left Ventricle: The ventricular cavity size is within normal limits. There are no stigmata of prior infarction. There is no abnormal filling defect. LVEF=69%.  Pulmonary arteries: Normal in size without proximal filling defect.  Pulmonary veins: Normal pulmonary venous drainage.  Pericardium: Normal thickness with no significant effusion or calcium present.  Mitral Valve: The mitral valve is normal structure without significant calcification.  Extra-cardiac findings: See attached radiology report for non-cardiac structures.  IMPRESSION: 1. Annular measurements (441 mm2) appropriate for 26 mm Edwards Sapien 3.  2. No significant annular or subannular  calcifications.  3. Sufficient coronary to annulus distance.  4. Optimal Fluoroscopic Angle for Delivery: LAO 10 CRA 1  [place] T. Audie Box, MD   Electronically Signed   By: Eleonore Chiquito   On: 11/02/2019 17:55   CLINICAL DATA:  74 year old female with history of severe aortic stenosis. Preprocedural study prior to potential transcatheter aortic valve replacement (TAVR) procedure.  EXAM: CT ANGIOGRAPHY CHEST, ABDOMEN AND PELVIS  TECHNIQUE: Non-contrast CT of the chest was initially obtained.  Multidetector CT imaging through the chest, abdomen and pelvis was performed using the standard protocol during bolus administration of intravenous  contrast. Multiplanar reconstructed images and MIPs were obtained and reviewed to evaluate the vascular anatomy.  CONTRAST:  128m OMNIPAQUE IOHEXOL 350 MG/ML SOLN  COMPARISON:  No prior chest CT. CT the abdomen and pelvis 03/25/2019.  FINDINGS: CTA CHEST FINDINGS  Cardiovascular: Heart size is mildly enlarged. There is no significant pericardial fluid, thickening or pericardial calcification. There is aortic atherosclerosis, as well as atherosclerosis of the great vessels of the mediastinum and the coronary arteries, including calcified atherosclerotic plaque in the left main coronary artery. Thickening calcification of the aortic valve.  Mediastinum/Lymph Nodes: No pathologically enlarged mediastinal or hilar lymph nodes. Esophagus is unremarkable in appearance. No axillary lymphadenopathy.  Lungs/Pleura: No acute consolidative airspace disease. No pleural effusions. No suspicious appearing pulmonary nodules or masses are noted.  Musculoskeletal/Soft Tissues: There are no aggressive appearing lytic or blastic lesions noted in the visualized portions of the skeleton.  CTA ABDOMEN AND PELVIS FINDINGS  Hepatobiliary: Diffuse low attenuation throughout the hepatic parenchyma, indicative of a background of  hepatic steatosis. Liver has a slightly shrunken appearance and nodular contour, indicative of underlying cirrhosis. No suspicious cystic or solid hepatic lesions. No intra or extrahepatic biliary ductal dilatation. Gallbladder is normal in appearance.  Pancreas: No pancreatic mass. No pancreatic no pancreatic ductal dilatation or peripancreatic fluid collections or inflammatory changes.  Spleen: Unremarkable.  Adrenals/Urinary Tract: Bilateral kidneys and adrenal glands are normal in appearance. No hydroureteronephrosis. Urinary bladder is normal in appearance.  Stomach/Bowel: Normal appearance of the stomach. No pathologic dilatation of small bowel or colon. A few scattered colonic diverticulae are noted, without surrounding inflammatory changes are noted to suggest acute diverticulitis. Normal appendix.  Vascular/Lymphatic: Aortic atherosclerosis, with findings and measurements pertinent to potential TAVR procedure, as detailed below. No aneurysm or dissection noted in the abdominal or pelvic vasculature. No lymphadenopathy noted in the abdomen or pelvis.  Reproductive: Uterus and ovaries are unremarkable in appearance.  Other: No significant volume of ascites.  No pneumoperitoneum.  Musculoskeletal: There are no aggressive appearing lytic or blastic lesions noted in the visualized portions of the skeleton.  VASCULAR MEASUREMENTS PERTINENT TO TAVR:  AORTA:  Minimal Aortic Diameter-16 x 16 mm  Severity of Aortic Calcification-mild  RIGHT PELVIS:  Right Common Iliac Artery -  Minimal Diameter-9.6 x 9.0 mm  Tortuosity-mild  Calcification-mild-to-moderate  Right External Iliac Artery -  Minimal Diameter-7.5 x 7.8 mm  Tortuosity-mild-to-moderate  Calcification-none  Right Common Femoral Artery -  Minimal Diameter-8.5 x 7.6 mm  Tortuosity-mild  Calcification-none  LEFT PELVIS:  Left Common Iliac Artery -  Minimal  Diameter-9.7 x 10.3 mm  Tortuosity-mild  Calcification-mild  Left External Iliac Artery -  Minimal Diameter-7.7 x 6.9 mm  Tortuosity-mild-to-moderate  Calcification-none  Left Common Femoral Artery -  Minimal Diameter-8.3 x 7.4 mm  Tortuosity-mild  Calcification-mild  Review of the MIP images confirms the above findings.  IMPRESSION: 1. Vascular findings and measurements pertinent to potential TAVR procedure, as detailed above. 2. Severe thickening calcification of the aortic valve, compatible with the reported clinical history of severe aortic stenosis. 3. Mild cardiomegaly. 4. Aortic atherosclerosis, in addition to left main coronary artery disease. Assessment for potential risk factor modification, dietary therapy or pharmacologic therapy may be warranted, if clinically indicated. 5. Hepatic steatosis. 6. Morphologic changes in the liver suggesting early cirrhosis. 7. Colonic diverticulosis without evidence of acute diverticulitis at this time. 8. Additional incidental findings, as above.   Electronically Signed   By: DVinnie LangtonM.D.   On: 11/02/2019 12:10   STS  Risk Score: Risk of Mortality: 2.686% Renal Failure: 5.513% Permanent Stroke: 0.991% Prolonged Ventilation: 14.985% DSW Infection: 0.221% Reoperation: 2.837% Morbidity or Mortality: 19.874% Short Length of Stay: 22.224% Long Length of Stay: 10.422%   Impression:  This 74 year old [name] has stage D, severe, symptomatic aortic stenosis with New York Heart Association class II symptoms of exertional fatigue and shortness of breath consistent with chronic diastolic congestive heart failure.  I have personally reviewed her 2D echocardiogram, cardiac catheterization, and CTA studies.  Echocardiogram shows a trileaflet aortic valve with leaflet thickening and calcification and restricted mobility.  The mean gradient is 32 mmHg with a peak velocity of 4.3 m/s.   Aortic valve area of 0.85 cm consistent with severe aortic stenosis.  There is mild aortic insufficiency.  Left ventricular ejection fraction 60 to 65%.  Cardiac catheterization shows no significant coronary disease.  The mean gradient was measured at 29 mmHg with an aortic valve area of 0.8 cm consistent with severe aortic stenosis.  I agree that aortic valve replacement is indicated in this patient for relief of her symptoms and to prevent progressive left ventricular deterioration.  Given her age, morbid obesity, debilitation due to arthritis with restricted mobility I think transcatheter aortic valve replacement would be the best option for her.  Her gated cardiac CTA shows anatomy suitable for transcatheter aortic valve replacement using a SAPIEN 3 valve.  Her abdominal and pelvic CTA shows adequate pelvic vascular anatomy to allow transfemoral insertion.  The patient and her daughter were counseled at length regarding treatment alternatives for management of severe symptomatic aortic stenosis. The risks and benefits of surgical intervention has been discussed in detail. Long-term prognosis with medical therapy was discussed. Alternative approaches such as conventional surgical aortic valve replacement, transcatheter aortic valve replacement, and palliative medical therapy were compared and contrasted at length. This discussion was placed in the context of the patient's own specific clinical presentation and past medical history. All of their questions have been addressed.   Following the decision to proceed with transcatheter aortic valve replacement, a discussion was held regarding what types of management strategies would be attempted intraoperatively in the event of life-threatening complications, including whether or not the patient would be considered a candidate for the use of cardiopulmonary bypass and/or conversion to open sternotomy for attempted surgical intervention.  She and her daughter  stated that she does not want any blood products given but would agree to receiving albumin if needed.  I do not think she would be a candidate for cardiopulmonary bypass due to her anemia and the hemodilution that occurs with cardiopulmonary bypass and the likely bleeding involved if we needed to use cardiopulmonary bypass.  I told her that if we needed to do a pericardial window or sternotomy to manage pericardial bleeding then I would do that but I would not give her any blood products or put her on cardiopulmonary bypass.  The patient has been advised of a variety of complications that might develop including but not limited to risks of death, stroke, paravalvular leak, aortic dissection or other major vascular complications, aortic annulus rupture, device embolization, cardiac rupture or perforation, mitral regurgitation, acute myocardial infarction, arrhythmia, heart block or bradycardia requiring permanent pacemaker placement, congestive heart failure, respiratory failure, renal failure, pneumonia, infection, other late complications related to structural valve deterioration or migration, or other complications that might ultimately cause a temporary or permanent loss of functional independence or other long term morbidity. The patient provides full informed consent for the  procedure as described and all questions were answered.    Plan:  She will be scheduled for transfemoral transcatheter aortic valve replacement on Tuesday, 11/09/2019.  I spent 80 minutes performing this consultation and > 50% of this time was spent face to face counseling and coordinating the care of this patient's severe symptomatic aortic stenosis.   Gaye Pollack, MD 11/04/2019 1:05 PM

## 2019-11-04 NOTE — Therapy (Signed)
[place], [place], [address] Phone: [phone]   Fax:  [phone]  Physical Therapy Evaluation  Patient Details  Name: Diana Harmon MRN: [id number] Date of Birth: May 17, 1945 Referring Provider (PT): Lauree Chandler, MD   Encounter Date: 11/04/2019   PT End of Session - 11/04/19 1338    Visit Number 1    PT Start Time 9242    PT Stop Time 1413    PT Time Calculation (min) 38 min    Activity Tolerance Patient tolerated treatment well    Behavior During Therapy [facility] for tasks assessed/performed           Past Medical History:  Diagnosis Date  . Anemia   . Benign tumor   . Diabetes mellitus without complication ([place])   . Hypertension   . Nausea   . Patient is [name]   . Severe aortic stenosis   . Thyroid disease     Past Surgical History:  Procedure Laterality Date  . COLONOSCOPY    . COLONOSCOPY WITH PROPOFOL N/A 03/13/2015   Procedure: COLONOSCOPY WITH PROPOFOL;  Surgeon: Manya Silvas, MD;  Location: [facility] ENDOSCOPY;  Service: Endoscopy;  Laterality: N/A;  . ESOPHAGOGASTRODUODENOSCOPY N/A 03/13/2015   Procedure: ESOPHAGOGASTRODUODENOSCOPY (EGD);  Surgeon: Manya Silvas, MD;  Location: [facility] ENDOSCOPY;  Service: Endoscopy;  Laterality: N/A;  . RIGHT/LEFT HEART CATH AND CORONARY ANGIOGRAPHY N/A 10/19/2019   Procedure: RIGHT/LEFT HEART CATH AND CORONARY ANGIOGRAPHY;  Surgeon: Nelva Bush, MD;  Location: [facility];  Service: Cardiovascular;  Laterality: N/A;  . THYROIDECTOMY    . tumor removed      There were no vitals filed for this visit.    Subjective Assessment - 11/04/19 1340    Subjective I have had a dry cough for about a month, some dizziness, difficulty breathing-esp if laying flat. SOB with exertion as well as sweating with exertion. Used to walk around grocery store with walker, limited to 30 min now. Began about 2 years ago.    Currently  in Pain? Yes    Pain Score 8     Pain Location --   wrists, back, knees   Pain Descriptors / Indicators Sore    Pain Type Chronic pain              [facility] PT Assessment - 11/04/19 0001      Assessment   Medical Diagnosis severe aortic stenosis    Referring Provider (PT) Lauree Chandler, MD    Onset Date/Surgical Date --   about 2 years ago   Hand Dominance Right      Precautions   Precaution Comments [place]      Restrictions   Weight Bearing Restrictions No      Balance Screen   Has the patient fallen in the past 6 months No      [place] residence    Living Arrangements Children    Additional Comments no stairs at home      Prior Function   Level of Independence Independent      Cognition   Overall Cognitive Status Within Functional Limits for tasks assessed      Observation/Other Assessments   Focus on Therapeutic Outcomes (FOTO)  N/a TAVR      Sensation   Additional Comments numbness in Rt middle finger & knee      Posture/Postural Control   Posture Comments rounded shoulders forward head  ROM / Strength   AROM / PROM / Strength AROM;Strength      AROM   Overall AROM Comments UE WFL, unable to bend fwd inseated to reach shoes      Strength   Overall Strength Comments UE & LE gross 4+/5    Strength Assessment Site Hand    Right/Left hand Right;Left    Right Hand Grip (lbs) 40    Left Hand Grip (lbs) 45      Ambulation/Gait   Gait Comments uses rollator at home, requested WC to enter building- did not bring rollator, walked with 2-wheel walker for testing            [facility] Pre-Surgical Assessment - 11/04/19 0001    5 Meter Walk Test- trial 1 7 sec    5 Meter Walk Test- trial 2 7 sec.     5 Meter Walk Test- trial 3 7 sec.    5 meter walk test average 7 sec    4 Stage Balance Test Position 1    Sit To Stand Test- trial 1 31 sec.    6 Minute Walk- Baseline yes    BP (mmHg) 162/77    HR (bpm) 76     02 Sat (%RA) 97 %    Modified Borg Scale for Dyspnea 0- Nothing at all    Perceived Rate of Exertion (Borg) 6-    6 Minute Walk Post Test yes    BP (mmHg) 204/79    HR (bpm) 96    02 Sat (%RA) 98 %    Modified Borg Scale for Dyspnea 9- Extremely severe    Perceived Rate of Exertion (Borg) 17- Very hard    Aerobic Endurance Distance Walked 605    Endurance additional comments 60% disability compared to age related normative values                    Objective measurements completed on examination: See above findings.                            Plan - 11/04/19 1414    PT Frequency One time visit    Consulted and Agree with Plan of Care Patient;Family member/caregiver    Family Member Consulted daughter          Clinical Impression Statement: Pt is a 74 yo F presenting to OP PT for evaluation prior to possible TAVR surgery due to severe aortic stenosis. Pt reports onset of SOB, dizziness and fatigue about 2 years ago. Symptoms are  limiting functional ambulation. Pt presents with limited ROM as she is unable to reach her feet for donning/doffing shoes, good strength and musculoskeletal pain in wrists, back and knees. Knee pain did affect 6MWT.   Pt ambulated 370 feet in 3:30 min before requesting a seated rest beak lasting 30s. At time of rest, patient's HR was 119 bpm and O2 was 91% on room air. Pt reported 8/10 shortness of breath on modified scale for dyspnea.  Pt ambulated a total of 605 feet in 6 minute walk and reported 9/10 SOB on modified scale for dyspena and 17/20 RPE on Borg's perceived exertion and pain scale at the end of the walk. During the 6 minute walk test, patient's HR increased to 121 BPM and O2 saturation decreased to 91%. Based on the Short Physical Performance Battery, patient has a frailty rating of 5/12 with </= 5/12 considered frail.  Visit Diagnosis: Difficulty  in walking, not elsewhere classified     Problem List Patient  Active Problem List   Diagnosis Date Noted  . Chronic heart failure with preserved ejection fraction (HFpEF) ([place]) 09/30/2019  . Normocytic anemia 02/17/2019  . Morbid obesity ([place]) 11/05/2017  . CKD (chronic kidney disease), stage II 07/24/2017  . Neutropenia ([place]) 04/24/2017  . Vitamin D deficiency 10/08/2016  . Chronic pain of both knees 10/08/2016  . Meniere's disease of right ear 07/04/2016  . TIA (transient ischemic attack) 07/04/2016  . Nonrheumatic aortic valve stenosis 07/04/2016  . Seasonal allergies 01/30/2016  . Recurrent UTI 10/26/2015  . Iron deficiency anemia due to chronic blood loss 02/28/2015  . Nausea 02/13/2015  . History of adenomatous polyp of colon 02/13/2015  . Osteoarthritis of both knees 12/05/2014  . Acid reflux 12/05/2014  . Benign neoplasm of colon 12/05/2014  . Type 2 diabetes mellitus with other specified complication ([place]) 00/37/0488  . Essential hypertension 12/05/2014  . Hyperlipidemia associated with type 2 diabetes mellitus ([place]) 12/05/2014  . Arthritis, degenerative 12/05/2014  . Morbid obesity with BMI of 40.0-44.9, adult ([place]) 12/05/2014  . Anemia due to multiple mechanisms 12/05/2014  . Abnormal liver enzymes 12/05/2014  . Abnormal levels of other serum enzymes 12/05/2014    Keon Pender C. Parish Augustine PT, DPT 11/04/19 2:18 PM   [facility] [address] [place], [place], [address] Phone: [phone]   Fax:  [phone]  Name: Diana Harmon MRN: [id number] Date of Birth: 04-Jul-1945

## 2019-11-05 ENCOUNTER — Encounter (HOSPITAL_COMMUNITY): Payer: Self-pay

## 2019-11-05 ENCOUNTER — Ambulatory Visit (HOSPITAL_COMMUNITY)
Admission: RE | Admit: 2019-11-05 | Discharge: 2019-11-05 | Disposition: A | Discharge status: Home / Self Care | Payer: Medicare Other | Source: Ambulatory Visit | Attending: Cardiovascular Disease | Admitting: Cardiovascular Disease

## 2019-11-05 ENCOUNTER — Inpatient Hospital Stay: Payer: Medicare Other | Attending: Internal Medicine

## 2019-11-05 ENCOUNTER — Encounter (HOSPITAL_COMMUNITY)
Admission: RE | Admit: 2019-11-05 | Discharge: 2019-11-05 | Disposition: A | Discharge status: Home / Self Care | Payer: Medicare Other | Source: Ambulatory Visit | Attending: Cardiovascular Disease | Admitting: Cardiovascular Disease

## 2019-11-05 ENCOUNTER — Other Ambulatory Visit: Payer: Self-pay

## 2019-11-05 VITALS — BP 144/68 | HR 80 | Temp 98.0°F | Resp 18

## 2019-11-05 DIAGNOSIS — Z79899 Other long term (current) drug therapy: Secondary | ICD-10-CM | POA: Insufficient documentation

## 2019-11-05 DIAGNOSIS — D649 Anemia, unspecified: Secondary | ICD-10-CM | POA: Diagnosis not present

## 2019-11-05 DIAGNOSIS — I10 Essential (primary) hypertension: Secondary | ICD-10-CM | POA: Diagnosis not present

## 2019-11-05 DIAGNOSIS — I350 Nonrheumatic aortic (valve) stenosis: Secondary | ICD-10-CM | POA: Diagnosis not present

## 2019-11-05 DIAGNOSIS — D500 Iron deficiency anemia secondary to blood loss (chronic): Secondary | ICD-10-CM

## 2019-11-05 DIAGNOSIS — I6523 Occlusion and stenosis of bilateral carotid arteries: Secondary | ICD-10-CM | POA: Diagnosis not present

## 2019-11-05 DIAGNOSIS — Z01818 Encounter for other preprocedural examination: Secondary | ICD-10-CM | POA: Insufficient documentation

## 2019-11-05 DIAGNOSIS — M1990 Unspecified osteoarthritis, unspecified site: Secondary | ICD-10-CM | POA: Diagnosis not present

## 2019-11-05 DIAGNOSIS — I4510 Unspecified right bundle-branch block: Secondary | ICD-10-CM | POA: Insufficient documentation

## 2019-11-05 DIAGNOSIS — Z87891 Personal history of nicotine dependence: Secondary | ICD-10-CM | POA: Diagnosis not present

## 2019-11-05 DIAGNOSIS — E892 Postprocedural hypoparathyroidism: Secondary | ICD-10-CM | POA: Insufficient documentation

## 2019-11-05 DIAGNOSIS — E119 Type 2 diabetes mellitus without complications: Secondary | ICD-10-CM | POA: Diagnosis not present

## 2019-11-05 DIAGNOSIS — Z7984 Long term (current) use of oral hypoglycemic drugs: Secondary | ICD-10-CM | POA: Insufficient documentation

## 2019-11-05 DIAGNOSIS — D509 Iron deficiency anemia, unspecified: Secondary | ICD-10-CM | POA: Diagnosis not present

## 2019-11-05 DIAGNOSIS — D508 Other iron deficiency anemias: Secondary | ICD-10-CM

## 2019-11-05 HISTORY — DX: Unspecified osteoarthritis, unspecified site: M19.90

## 2019-11-05 LAB — BLOOD GAS, ARTERIAL
Acid-Base Excess: 0.4 mmol/L (ref 0.0–2.0)
Bicarbonate: 26 mmol/L (ref 20.0–28.0)
Drawn by: 421801
FIO2: 21
O2 Saturation: 33.8 %
Patient temperature: 37
pCO2 arterial: 53.7 mmHg — ABNORMAL HIGH (ref 32.0–48.0)
pH, Arterial: 7.307 — ABNORMAL LOW (ref 7.350–7.450)
pO2, Arterial: <31 mmHg — CL (ref 83.0–108.0)

## 2019-11-05 LAB — URINALYSIS, ROUTINE W REFLEX MICROSCOPIC
Bilirubin Urine: NEGATIVE
Glucose, UA: NEGATIVE mg/dL
Hgb urine dipstick: NEGATIVE
Ketones, ur: NEGATIVE mg/dL
Nitrite: POSITIVE — AB
Protein, ur: NEGATIVE mg/dL
Specific Gravity, Urine: 1.016 (ref 1.005–1.030)
pH: 5 (ref 5.0–8.0)

## 2019-11-05 LAB — BRAIN NATRIURETIC PEPTIDE: B Natriuretic Peptide: 22.9 pg/mL (ref 0.0–100.0)

## 2019-11-05 LAB — CBC
HCT: 34.3 % — ABNORMAL LOW (ref 36.0–46.0)
Hemoglobin: 10.5 g/dL — ABNORMAL LOW (ref 12.0–15.0)
MCH: 30.7 pg (ref 26.0–34.0)
MCHC: 30.6 g/dL (ref 30.0–36.0)
MCV: 100.3 fL — ABNORMAL HIGH (ref 80.0–100.0)
Platelets: 281 10*3/uL (ref 150–400)
RBC: 3.42 MIL/uL — ABNORMAL LOW (ref 3.87–5.11)
RDW: 14.6 % (ref 11.5–15.5)
WBC: 3.9 10*3/uL — ABNORMAL LOW (ref 4.0–10.5)
nRBC: 0 % (ref 0.0–0.2)

## 2019-11-05 LAB — COMPREHENSIVE METABOLIC PANEL
ALT: 39 U/L (ref 0–44)
AST: 61 U/L — ABNORMAL HIGH (ref 15–41)
Albumin: 3.4 g/dL — ABNORMAL LOW (ref 3.5–5.0)
Alkaline Phosphatase: 53 U/L (ref 38–126)
Anion gap: 10 (ref 5–15)
BUN: 29 mg/dL — ABNORMAL HIGH (ref 8–23)
CO2: 23 mmol/L (ref 22–32)
Calcium: 9.7 mg/dL (ref 8.9–10.3)
Chloride: 106 mmol/L (ref 98–111)
Creatinine, Ser: 1.06 mg/dL — ABNORMAL HIGH (ref 0.44–1.00)
GFR calc Af Amer: >60 mL/min (ref >60)
GFR calc non Af Amer: 52 mL/min — ABNORMAL LOW (ref >60)
Glucose, Bld: 219 mg/dL — ABNORMAL HIGH (ref 70–99)
Potassium: 4 mmol/L (ref 3.5–5.1)
Sodium: 139 mmol/L (ref 135–145)
Total Bilirubin: 0.6 mg/dL (ref 0.3–1.2)
Total Protein: 7.7 g/dL (ref 6.5–8.1)

## 2019-11-05 LAB — APTT: aPTT: 27 seconds (ref 24–36)

## 2019-11-05 LAB — PROTIME-INR
INR: 1.1 (ref 0.8–1.2)
Prothrombin Time: 13.6 seconds (ref 11.4–15.2)

## 2019-11-05 LAB — SURGICAL PCR SCREEN
MRSA, PCR: NEGATIVE
Staphylococcus aureus: NEGATIVE

## 2019-11-05 LAB — NO BLOOD PRODUCTS

## 2019-11-05 LAB — HEMOGLOBIN A1C
Hgb A1c MFr Bld: 7.7 % — ABNORMAL HIGH (ref 4.8–5.6)
Mean Plasma Glucose: 174.29 mg/dL

## 2019-11-05 LAB — GLUCOSE, CAPILLARY: Glucose-Capillary: 229 mg/dL — ABNORMAL HIGH (ref 70–99)

## 2019-11-05 MED ORDER — DEXMEDETOMIDINE HCL IN NACL 400 MCG/100ML IV SOLN
0.1000 ug/kg/h | INTRAVENOUS | Status: AC
  Administered 2019-11-09: 1 ug/kg/h via INTRAVENOUS
  Administered 2019-11-09: 106.6 ug via INTRAVENOUS
  Filled 2019-11-05 (×2): qty 100

## 2019-11-05 MED ORDER — DIPHENHYDRAMINE HCL 50 MG/ML IJ SOLN
25.0000 mg | Freq: Once | INTRAMUSCULAR | Status: AC
  Administered 2019-11-05: 25 mg via INTRAVENOUS
  Filled 2019-11-05: qty 1

## 2019-11-05 MED ORDER — SODIUM CHLORIDE 0.9 % IV SOLN
Freq: Once | INTRAVENOUS | Status: AC
  Filled 2019-11-05: qty 250

## 2019-11-05 MED ORDER — SODIUM CHLORIDE 0.9 % IV SOLN
INTRAVENOUS | Status: DC
  Filled 2019-11-05: qty 30

## 2019-11-05 MED ORDER — MAGNESIUM SULFATE 50 % IJ SOLN
40.0000 meq | INTRAMUSCULAR | Status: DC
  Filled 2019-11-05: qty 9.85

## 2019-11-05 MED ORDER — IRON SUCROSE 20 MG/ML IV SOLN
200.0000 mg | Freq: Once | INTRAVENOUS | Status: AC
  Administered 2019-11-05: 200 mg via INTRAVENOUS
  Filled 2019-11-05: qty 10

## 2019-11-05 MED ORDER — SODIUM CHLORIDE 0.9 % IV SOLN
1.5000 g | INTRAVENOUS | Status: AC
  Administered 2019-11-09: 1.5 g via INTRAVENOUS
  Filled 2019-11-05: qty 1.5

## 2019-11-05 MED ORDER — VANCOMYCIN HCL 1500 MG/300ML IV SOLN
1500.0000 mg | INTRAVENOUS | Status: AC
  Administered 2019-11-09: 1500 mg via INTRAVENOUS
  Filled 2019-11-05 (×2): qty 300

## 2019-11-05 MED ORDER — POTASSIUM CHLORIDE 2 MEQ/ML IV SOLN
80.0000 meq | INTRAVENOUS | Status: DC
  Filled 2019-11-05: qty 40

## 2019-11-05 MED ORDER — DEXAMETHASONE SODIUM PHOSPHATE 10 MG/ML IJ SOLN
4.0000 mg | Freq: Once | INTRAMUSCULAR | Status: AC
  Administered 2019-11-05: 4 mg via INTRAVENOUS
  Filled 2019-11-05: qty 1

## 2019-11-05 MED ORDER — NOREPINEPHRINE 4 MG/250ML-% IV SOLN
0.0000 ug/min | INTRAVENOUS | Status: DC
  Filled 2019-11-05: qty 250

## 2019-11-05 NOTE — Progress Notes (Signed)
Carotid duplex bilateral study completed.   See Cv Proc for preliminary results.   Khaleed Holan  

## 2019-11-05 NOTE — Anesthesia Preprocedure Evaluation (Addendum)
Anesthesia Evaluation  Patient identified by MRN, date of birth, ID band Patient awake    Reviewed: Allergy & Precautions, NPO status , Patient's Chart, lab work & pertinent test results  Airway Mallampati: II  TM Distance: >3 FB Neck ROM: Full    Dental  (+) Poor Dentition, Missing   Pulmonary former smoker,    Pulmonary exam normal breath sounds clear to auscultation       Cardiovascular hypertension, Pt. on home beta blockers and Pt. on medications + Valvular Problems/Murmurs AS  Rhythm:Regular Rate:Normal + Systolic murmurs ECG: rate 82. Normal sinus rhythm Right bundle branch block  CATH: Conclusions: 1. No angiographically significant coronary artery disease. 2. Moderate to severe aortic stenosis (mean gradient 29 mmHg, AVA 0.8 cm^2). 3. Mildly elevated right heart filling pressure. 4. Upper normal to mildly elevated left heart filling pressure. 5. Normal Fick cardiac output/index.    Neuro/Psych TIAnegative psych ROS   GI/Hepatic negative GI ROS, Neg liver ROS,   Endo/Other  diabetes, Oral Hypoglycemic AgentsMorbid obesity  Renal/GU negative Renal ROS     Musculoskeletal negative musculoskeletal ROS (+)   Abdominal (+) + obese,   Peds  Hematology  (+) anemia , REFUSES BLOOD PRODUCTS, JEHOVAH'S WITNESSHLD Ok with albumin   Anesthesia Other Findings Severe Aortic Stenosis  Reproductive/Obstetrics                            Anesthesia Physical Anesthesia Plan  ASA: IV  Anesthesia Plan: MAC   Post-op Pain Management:    Induction: Intravenous  PONV Risk Score and Plan: 2 and Ondansetron, Dexamethasone and Treatment may vary due to age or medical condition  Airway Management Planned: Simple Face Mask  Additional Equipment:   Intra-op Plan:   Post-operative Plan:   Informed Consent: I have reviewed the patients History and Physical, chart, labs and discussed the  procedure including the risks, benefits and alternatives for the proposed anesthesia with the patient or authorized representative who has indicated his/her understanding and acceptance.     Dental advisory given  Plan Discussed with: CRNA  Anesthesia Plan Comments: (Reviewed PAT note written 11/05/2019 by Myra Gianotti, PA-C. ABG thought to likely be a venous specimen. Per cardiology communication, patient is a Sales promotion account executive Witness, "agreed to albumin but no other blood products". She received Venofer on 11/05/19. )       Anesthesia Quick Evaluation

## 2019-11-05 NOTE — Progress Notes (Signed)
Anesthesia Chart Review:  Case: [id number] Date/Time: 11/09/19 1200   Procedures:      TRANSCATHETER AORTIC VALVE REPLACEMENT, TRANSFEMORAL (N/A )     TRANSESOPHAGEAL ECHOCARDIOGRAM (TEE) (N/A )   Anesthesia type: General   Pre-op diagnosis: Severe Aortic Stenosis   Location: [facility] / [facility]   Providers: Burnell Blanks, MD     CT Surgeon: Gilford Raid, MD   DISCUSSION: Patient is a 74 year old female scheduled for the above procedure.  History includes former smoker (quit 09/05/79), DM2, anemia, HTN, murmur/severe AS, primary hyperparathyroidism (s/p right superior parathyroidectomy for adenoma 11/20/04, [facility]), colon resection (for rectal carcinoid, 2011). She is a Restaurant manager, fast food. (Thyroidectomy is listed, but records found for right parathyroidectomy not thyroidectomy. Thyroid gland present on 2013 parathyroid scan.). BMI is consistent with morbid obesity.   Preoperative labs showed Cr 1.06, mildly elevated AST 61 (normal ALT, Alk Ph; hepatic steatosis, possible early cirrhosis noted on 11/02/19 CT), H/H 10.5/34.3, PLT 281, A1c 7.7%. Her UA was + leukocytes, nitrites. Critical "ABG" results include pO2 on ABG < 31.0, pCO2 53.7, pH 7.307. PAT RN called ABG and UA results to Nell Range, PA-C with cardiology. She also suspected ABG was a venous specimen. Patient had not UTI symptoms, so cardiology is not treating abnormal UA. Patient's SpO2 was 100% on arrival to her PAT visit. No SOB, cough, fever, chest pain per PAT RN visit.   She received Venofer on 11/05/19. Per cardiology communication, patient is a Sales promotion account executive Witness, "agreed to albumin but no other blood products".  Presurgical COVID-19 test is scheduled for 11/06/19.  Anesthesia team to evaluate on the day of surgery. 11/05/19 CXR report is still in process.    VS: BP 133/60   Pulse 81   Temp 37 C (Oral)   Resp 19   Ht 5' (1.524 m)   Wt 106.8 kg   SpO2 100%   BMI 45.99 kg/m    PROVIDERS: [name], DO is PCP  End, Harrell Gave, MD is cardiologist Charlaine Dalton, MD is hematologist   LABS: Preoperative labs noted. See DISCUSSION. (all labs ordered are listed, but only abnormal results are displayed)  Labs Reviewed  GLUCOSE, CAPILLARY - Abnormal; Notable for the following components:      Result Value   Glucose-Capillary 229 (*)    All other components within normal limits  BLOOD GAS, ARTERIAL - Abnormal; Notable for the following components:   pH, Arterial 7.307 (*)    pCO2 arterial 53.7 (*)    pO2, Arterial <31.0 (*)    Allens test (pass/fail) BRACHIAL ARTERY (*)    All other components within normal limits  CBC - Abnormal; Notable for the following components:   WBC 3.9 (*)    RBC 3.42 (*)    Hemoglobin 10.5 (*)    HCT 34.3 (*)    MCV 100.3 (*)    All other components within normal limits  COMPREHENSIVE METABOLIC PANEL - Abnormal; Notable for the following components:   Glucose, Bld 219 (*)    BUN 29 (*)    Creatinine, Ser 1.06 (*)    Albumin 3.4 (*)    AST 61 (*)    GFR calc non Af Amer 52 (*)    All other components within normal limits  HEMOGLOBIN A1C - Abnormal; Notable for the following components:   Hgb A1c MFr Bld 7.7 (*)    All other components within normal limits  URINALYSIS, ROUTINE W REFLEX MICROSCOPIC -  Abnormal; Notable for the following components:   APPearance HAZY (*)    Nitrite POSITIVE (*)    Leukocytes,Ua TRACE (*)    Bacteria, UA MANY (*)    All other components within normal limits  SURGICAL PCR SCREEN  APTT  BRAIN NATRIURETIC PEPTIDE  PROTIME-INR  NO BLOOD PRODUCTS     IMAGES: CXR 11/05/19: In process.  CTA chest/abd/pelvis 11/02/19: IMPRESSION: 1. Vascular findings and measurements pertinent to potential TAVR procedure, as detailed above. 2. Severe thickening calcification of the aortic valve, compatible with the reported clinical history of severe aortic stenosis. 3. Mild cardiomegaly. 4. Aortic  atherosclerosis, in addition to left main coronary artery disease. Assessment for potential risk factor modification, dietary therapy or pharmacologic therapy may be warranted, if clinically indicated. 5. Hepatic steatosis. 6. Morphologic changes in the liver suggesting early cirrhosis. 7. Colonic diverticulosis without evidence of acute diverticulitis at this time. 8. Additional incidental findings, as above. [See full report]   EKG: 11/05/19: NSR, RBBB   CV: Carotid US (Preliminary) 11/05/19: Summary:  - Right Carotid: Velocities in the right ICA are consistent with a 1-39% stenosis. The extracranial vessels were near-normal with only minimal wall thickening or plaque.  - Left Carotid: Velocities in the left ICA are consistent with a 1-39% stenosis. The extracranial vessels were near-normal with only minimal wall thickening or plaque.  - Vertebrals:  Bilateral vertebral arteries demonstrate antegrade flow.  - Subclavians: Normal flow hemodynamics were seen in bilateral subclavian arteries.    CT Coronary 11/02/19: IMPRESSION: 1. Annular measurements (441 mm2) appropriate for 26 mm Edwards Sapien 3. 2. No significant annular or subannular calcifications. 3. Sufficient coronary to annulus distance. 4. Optimal Fluoroscopic Angle for Delivery: LAO 10 CRA 1   Cardiac cath 10/19/19: Conclusions: No angiographically significant coronary artery disease. Moderate to severe aortic stenosis (mean gradient 29 mmHg, AVA 0.8 cm^2). Mildly elevated right heart filling pressure. Upper normal to mildly elevated left heart filling pressure. Normal Fick cardiac output/index. Recommendations: Refer to valve clinic for further workup/management of moderate to severe aortic stenosis. Discontinue aspirin, given chronic anemia and lack of coronary artery disease. Continue gentle diuresis. Primary prevention of coronary artery disease.   Echo 08/19/19: IMPRESSIONS   1. Left ventricular ejection  fraction, by estimation, is 60 to 65%. The  left ventricle has normal function. The left ventricle has no regional  wall motion abnormalities. There is moderate left ventricular hypertrophy.  Left ventricular diastolic  parameters are consistent with Grade I diastolic dysfunction (impaired  relaxation).   2. Right ventricular systolic function is normal. The right ventricular  size is normal.   3. Left atrial size was mildly dilated.   4. The aortic valve is normal in structure. Aortic valve regurgitation is  mild. Moderate to severe aortic valve stenosis visually and by mean  gradient. Aortic valve mean gradient measures 32.0 mmHg. Severe stenosis  by Aortic valve area, by VTI measures  0.85 cm and peak velocity 4.33 m/sec    Past Medical History:  Diagnosis Date   Anemia    Arthritis    legs, knees, back, wrists   Benign tumor    Diabetes mellitus without complication (HCC)    Heart murmur    Hypertension    Nausea    Patient is [name]    Severe aortic stenosis    Thyroid disease     Past Surgical History:  Procedure Laterality Date   CARDIAC CATHETERIZATION  10/19/2019   COLON SURGERY  caudarized polyps; rectal carcinoid excision ~ 2011   COLONOSCOPY     COLONOSCOPY WITH PROPOFOL N/A 03/13/2015   Procedure: COLONOSCOPY WITH PROPOFOL;  Surgeon: Manya Silvas, MD;  Location: [facility];  Service: Endoscopy;  Laterality: N/A;   ESOPHAGOGASTRODUODENOSCOPY N/A 03/13/2015   Procedure: ESOPHAGOGASTRODUODENOSCOPY (EGD);  Surgeon: Manya Silvas, MD;  Location: [facility] ENDOSCOPY;  Service: Endoscopy;  Laterality: N/A;   PARATHYROIDECTOMY  11/20/2004   right superior parathyroidecomy for primary hyperparathyroidism/parathyroid adenoma   RIGHT/LEFT HEART CATH AND CORONARY ANGIOGRAPHY N/A 10/19/2019   Procedure: RIGHT/LEFT HEART CATH AND CORONARY ANGIOGRAPHY;  Surgeon: Nelva Bush, MD;  Location: [place] [facility];  Service: Cardiovascular;  Laterality:  N/A;   THYROIDECTOMY      MEDICATIONS:  acetaminophen (TYLENOL) 500 MG tablet   Artificial Tear Solution (GENTEAL TEARS) 0.1-0.2-0.3 % SOLN   B COMPLEX-C PO   baclofen (LIORESAL) 10 MG tablet   Blood Glucose Monitoring Suppl (ACCU-CHEK NANO SMARTVIEW) w/Device KIT   Cholecalciferol (VITAMIN D3) 125 MCG (5000 UT) TABS   Ferrous Sulfate (IRON) 325 (65 Fe) MG TABS   furosemide (LASIX) 20 MG tablet   gabapentin (NEURONTIN) 100 MG capsule   glucose blood (ACCU-CHEK SMARTVIEW) test strip   hydrochlorothiazide (HYDRODIURIL) 25 MG tablet   lactobacillus acidophilus (BACID) TABS tablet   levocetirizine (XYZAL) 5 MG tablet   losartan (COZAAR) 50 MG tablet   metFORMIN (GLUCOPHAGE) 500 MG tablet   metoprolol tartrate (LOPRESSOR) 50 MG tablet   mupirocin ointment (BACTROBAN) 2 %   simvastatin (ZOCOR) 20 MG tablet   No current facility-administered medications for this encounter.    [START ON 11/09/2019] cefUROXime (ZINACEF) 1.5 g in sodium chloride 0.9 % 100 mL IVPB   [START ON 11/09/2019] dexmedetomidine (PRECEDEX) 400 MCG/100ML (4 mcg/mL) infusion   [START ON 11/09/2019] heparin 30,000 units/NS 1000 mL solution for CELLSAVER   [START ON 11/09/2019] magnesium sulfate (IV Push/IM) injection 40 mEq   [START ON 11/09/2019] norepinephrine (LEVOPHED) 57m in 2527mpremix infusion   [START ON 11/09/2019] potassium chloride injection 80 mEq   [START ON 11/09/2019] vancomycin (VANCOREADY) IVPB 1500 mg/300 mL     AlMyra GianottiPA-C Surgical Short Stay/Anesthesiology [facility] ([phone] [facility].hone ([phone]/06/2019 5:22 PM

## 2019-11-05 NOTE — Progress Notes (Signed)
PCP - [name], DO Cardiologist - Nelva Bush, MD  PPM/ICD - Denies  Chest x-ray - 11/05/19 EKG - 11/05/19 Stress Test - 07/18/16 ECHO - 08/20/19 Cardiac Cath - 10/19/19  Sleep Study - Denies  Fasting Blood Sugar: 141-147 Checks Blood Sugar x1 every other day. CBG at PAT appointment was 229. A1C obtained.  Blood Thinner Instructions: N/A Aspirin Instructions: N/A  ERAS Protcol - N/A PRE-SURGERY Ensure or G2- N/A  COVID TEST- 11/06/19   Anesthesia review: Yes, cardiac hx.  Patient denies shortness of breath, fever, cough and chest pain at PAT appointment   All instructions explained to the patient, with a verbal understanding of the material. Patient agrees to go over the instructions while at home for a better understanding. Patient also instructed to self quarantine after being tested for COVID-19. The opportunity to ask questions was provided.

## 2019-11-05 NOTE — Progress Notes (Signed)
Your procedure is scheduled on Tuesday 11/09/19.  Report to [facility] Main Entrance "A" at 10:15 A.M., and check in at the Admitting office.  Call this number if you have problems the morning of surgery:  [phone]  Call [phone] if you have any questions prior to your surgery date Monday-Friday 8am-4pm    Remember:  Do not eat or drink after midnight the night before your surgery   Stop Metformin on 11/07/19   Continue taking all other current medications without change through the day before surgery.    On the morning of surgery do not take any medications    HOW TO MANAGE YOUR DIABETES BEFORE AND AFTER SURGERY  Why is it important to control my blood sugar before and after surgery? . Improving blood sugar levels before and after surgery helps healing and can limit problems. . A way of improving blood sugar control is eating a healthy diet by: o  Eating less sugar and carbohydrates o  Increasing activity/exercise o  Talking with your doctor about reaching your blood sugar goals . High blood sugars (greater than 180 mg/dL) can raise your risk of infections and slow your recovery, so you will need to focus on controlling your diabetes during the weeks before surgery. . Make sure that the doctor who takes care of your diabetes knows about your planned surgery including the date and location.  How do I manage my blood sugar before surgery? . Check your blood sugar at least 4 times a day, starting 2 days before surgery, to make sure that the level is not too high or low. . Check your blood sugar the morning of your surgery when you wake up and every 2 hours until you get to the Short Stay unit. o If your blood sugar is less than 70 mg/dL, you will need to treat for low blood sugar: - Do not take insulin. - Treat a low blood sugar (less than 70 mg/dL) with  cup of clear juice (cranberry or apple), 4 glucose tablets, OR glucose gel. - Recheck blood sugar in 15 minutes after  treatment (to make sure it is greater than 70 mg/dL). If your blood sugar is not greater than 70 mg/dL on recheck, call [phone] for further instructions. . Report your blood sugar to the short stay nurse when you get to Short Stay.  . If you are admitted to the hospital after surgery: o Your blood sugar will be checked by the staff and you will probably be given insulin after surgery (instead of oral diabetes medicines) to make sure you have good blood sugar levels. o The goal for blood sugar control after surgery is 80-180 mg/dL.                       Do not wear jewelry, make up, or nail polish            Do not wear lotions, powders, or deodorant.            Do not shave 48 hours prior to surgery.             Do not bring valuables to the hospital.            [facility] is not responsible for any belongings or valuables.  Do NOT Smoke (Tobacco/Vaping) or drink Alcohol 24 hours prior to your procedure  If you use a CPAP at night, you may bring all equipment for your overnight stay.  Contacts, glasses, dentures or bridgework may not be worn into surgery.      For patients admitted to the hospital, discharge time will be determined by your treatment team.   Patients discharged the day of surgery will not be allowed to drive home, and someone needs to stay with them for 24 hours.    Special instructions:   [facility]- Preparing For Surgery  Before surgery, you can play an important role. Because skin is not sterile, your skin needs to be as free of germs as possible. You can reduce the number of germs on your skin by washing with CHG (chlorahexidine gluconate) Soap before surgery.  CHG is an antiseptic cleaner which kills germs and bonds with the skin to continue killing germs even after washing.    Oral Hygiene is also important to reduce your risk of infection.  Remember - BRUSH YOUR TEETH THE MORNING OF SURGERY WITH YOUR REGULAR TOOTHPASTE  Please do not use if you have an  allergy to CHG or antibacterial soaps. If your skin becomes reddened/irritated stop using the CHG.  Do not shave (including legs and underarms) for at least 48 hours prior to first CHG shower. It is OK to shave your face.  Please follow these instructions carefully.   1. Shower the NIGHT BEFORE SURGERY and the MORNING OF SURGERY with CHG Soap.   2. If you chose to wash your hair, wash your hair first as usual with your normal shampoo.  3. After you shampoo, rinse your hair and body thoroughly to remove the shampoo.  4. Use CHG as you would any other liquid soap. You can apply CHG directly to the skin and wash gently with a scrungie or a clean washcloth.   5. Apply the CHG Soap to your body ONLY FROM THE NECK DOWN.  Do not use on open wounds or open sores. Avoid contact with your eyes, ears, mouth and genitals (private parts). Wash Face and genitals (private parts)  with your normal soap.   6. Wash thoroughly, paying special attention to the area where your surgery will be performed.  7. Thoroughly rinse your body with warm water from the neck down.  8. DO NOT shower/wash with your normal soap after using and rinsing off the CHG Soap.  9. Pat yourself dry with a CLEAN TOWEL.  10. Wear CLEAN PAJAMAS to bed the night before surgery  11. Place CLEAN SHEETS on your bed the night of your first shower and DO NOT SLEEP WITH PETS.   Day of Surgery: Wear Clean/Comfortable clothing the morning of surgery Do not apply any deodorants/lotions.   Remember to brush your teeth WITH YOUR REGULAR TOOTHPASTE.   Please read over the following fact sheets that you were given.

## 2019-11-05 NOTE — Progress Notes (Signed)
Critical Result: ABG PO2 <31. Myra Gianotti, PA-C notified. Abnormal U/A notied. Nell Range, PA notified of Critical ABG result and abnormal U/A.

## 2019-11-06 ENCOUNTER — Other Ambulatory Visit (HOSPITAL_COMMUNITY)
Admission: RE | Admit: 2019-11-06 | Discharge: 2019-11-06 | Disposition: A | Discharge status: Home / Self Care | Payer: Medicare Other | Source: Ambulatory Visit | Attending: Cardiovascular Disease | Admitting: Cardiovascular Disease

## 2019-11-06 DIAGNOSIS — Z87891 Personal history of nicotine dependence: Secondary | ICD-10-CM | POA: Diagnosis not present

## 2019-11-06 DIAGNOSIS — Z832 Family history of diseases of the blood and blood-forming organs and certain disorders involving the immune mechanism: Secondary | ICD-10-CM | POA: Diagnosis not present

## 2019-11-06 DIAGNOSIS — Z820 Family history of epilepsy and other diseases of the nervous system: Secondary | ICD-10-CM | POA: Diagnosis not present

## 2019-11-06 DIAGNOSIS — D649 Anemia, unspecified: Secondary | ICD-10-CM | POA: Diagnosis not present

## 2019-11-06 DIAGNOSIS — Z006 Encounter for examination for normal comparison and control in clinical research program: Secondary | ICD-10-CM | POA: Diagnosis not present

## 2019-11-06 DIAGNOSIS — I5022 Chronic systolic (congestive) heart failure: Secondary | ICD-10-CM | POA: Diagnosis not present

## 2019-11-06 DIAGNOSIS — Z20822 Contact with and (suspected) exposure to covid-19: Secondary | ICD-10-CM | POA: Diagnosis not present

## 2019-11-06 DIAGNOSIS — M1990 Unspecified osteoarthritis, unspecified site: Secondary | ICD-10-CM | POA: Diagnosis not present

## 2019-11-06 DIAGNOSIS — I350 Nonrheumatic aortic (valve) stenosis: Secondary | ICD-10-CM | POA: Diagnosis not present

## 2019-11-06 DIAGNOSIS — Z79899 Other long term (current) drug therapy: Secondary | ICD-10-CM | POA: Diagnosis not present

## 2019-11-06 DIAGNOSIS — I110 Hypertensive heart disease with heart failure: Secondary | ICD-10-CM | POA: Diagnosis not present

## 2019-11-06 DIAGNOSIS — I4510 Unspecified right bundle-branch block: Secondary | ICD-10-CM | POA: Diagnosis not present

## 2019-11-06 DIAGNOSIS — Z7984 Long term (current) use of oral hypoglycemic drugs: Secondary | ICD-10-CM | POA: Diagnosis not present

## 2019-11-06 DIAGNOSIS — E785 Hyperlipidemia, unspecified: Secondary | ICD-10-CM | POA: Diagnosis not present

## 2019-11-06 DIAGNOSIS — E1169 Type 2 diabetes mellitus with other specified complication: Secondary | ICD-10-CM | POA: Diagnosis not present

## 2019-11-06 DIAGNOSIS — Z8249 Family history of ischemic heart disease and other diseases of the circulatory system: Secondary | ICD-10-CM | POA: Diagnosis not present

## 2019-11-06 LAB — SARS CORONAVIRUS 2 (TAT 6-24 HRS): SARS Coronavirus 2: NEGATIVE

## 2019-11-07 ENCOUNTER — Other Ambulatory Visit: Payer: Self-pay | Admitting: Family Medicine

## 2019-11-07 DIAGNOSIS — I10 Essential (primary) hypertension: Secondary | ICD-10-CM

## 2019-11-07 NOTE — Telephone Encounter (Signed)
Requested Prescriptions  Pending Prescriptions Disp Refills  . losartan (COZAAR) 50 MG tablet [Pharmacy Med Name: Losartan Potassium 50 MG Oral Tablet] 90 tablet 0    Sig: Take 1 tablet by mouth once daily     Cardiovascular:  Angiotensin Receptor Blockers Failed - 11/07/2019  4:06 PM      Failed - Cr in normal range and within 180 days    Creat  Date Value Ref Range Status  07/21/2017 0.91 0.60 - 0.93 mg/dL Final    Comment:    For patients >73 years of age, the reference limit for Creatinine is approximately 13% higher for people identified as African-American. .    Creatinine, Ser  Date Value Ref Range Status  11/05/2019 1.06 (H) 0.44 - 1.00 mg/dL Final         Failed - Last BP in normal range    BP Readings from Last 1 Encounters:  11/05/19 (!) 144/68         Passed - K in normal range and within 180 days    Potassium  Date Value Ref Range Status  11/05/2019 4.0 3.5 - 5.1 mmol/L Final         Passed - Patient is not pregnant      Passed - Valid encounter within last 6 months    Recent Outpatient Visits          1 month ago Herpes zoster without complication   [place], DO   7 months ago Type 2 diabetes mellitus with other specified complication, without long-term current use of insulin [facility])   [facility] Devonne Doughty, DO   1 year ago Controlled type 2 diabetes mellitus without complication, without long-term current use of insulin [facility])   [place], DO   1 year ago Chronic pain of both knees   [place], Devonne Doughty, DO   2 years ago Annual physical exam   [place], Devonne Doughty, DO

## 2019-11-09 ENCOUNTER — Encounter (HOSPITAL_COMMUNITY)
Admission: RE | Disposition: A | Discharge status: Home / Self Care | Payer: Self-pay | Source: Home / Self Care | Attending: Surgery

## 2019-11-09 ENCOUNTER — Ambulatory Visit (HOSPITAL_COMMUNITY): Payer: Medicare Other

## 2019-11-09 ENCOUNTER — Inpatient Hospital Stay (HOSPITAL_COMMUNITY): Payer: Medicare Other | Admitting: Anesthesiology

## 2019-11-09 ENCOUNTER — Encounter (HOSPITAL_COMMUNITY): Payer: Self-pay | Admitting: Cardiovascular Disease

## 2019-11-09 ENCOUNTER — Inpatient Hospital Stay (HOSPITAL_COMMUNITY): Payer: Medicare Other

## 2019-11-09 ENCOUNTER — Inpatient Hospital Stay (HOSPITAL_COMMUNITY)
Admission: RE | Admit: 2019-11-09 | Discharge: 2019-11-10 | DRG: 267 | Disposition: A | Discharge status: Home / Self Care | Payer: Medicare Other | Attending: Surgery | Admitting: Surgery

## 2019-11-09 ENCOUNTER — Inpatient Hospital Stay (HOSPITAL_COMMUNITY): Payer: Medicare Other | Admitting: Vascular Surgery

## 2019-11-09 ENCOUNTER — Other Ambulatory Visit: Payer: Self-pay

## 2019-11-09 DIAGNOSIS — R21 Rash and other nonspecific skin eruption: Secondary | ICD-10-CM | POA: Diagnosis not present

## 2019-11-09 DIAGNOSIS — Z952 Presence of prosthetic heart valve: Secondary | ICD-10-CM

## 2019-11-09 DIAGNOSIS — Z20822 Contact with and (suspected) exposure to covid-19: Secondary | ICD-10-CM | POA: Diagnosis present

## 2019-11-09 DIAGNOSIS — M159 Polyosteoarthritis, unspecified: Secondary | ICD-10-CM | POA: Diagnosis not present

## 2019-11-09 DIAGNOSIS — I10 Essential (primary) hypertension: Secondary | ICD-10-CM | POA: Diagnosis not present

## 2019-11-09 DIAGNOSIS — Z006 Encounter for examination for normal comparison and control in clinical research program: Secondary | ICD-10-CM | POA: Diagnosis not present

## 2019-11-09 DIAGNOSIS — D649 Anemia, unspecified: Secondary | ICD-10-CM | POA: Diagnosis not present

## 2019-11-09 DIAGNOSIS — Z820 Family history of epilepsy and other diseases of the nervous system: Secondary | ICD-10-CM | POA: Diagnosis not present

## 2019-11-09 DIAGNOSIS — M1990 Unspecified osteoarthritis, unspecified site: Secondary | ICD-10-CM | POA: Diagnosis present

## 2019-11-09 DIAGNOSIS — I509 Heart failure, unspecified: Secondary | ICD-10-CM | POA: Diagnosis not present

## 2019-11-09 DIAGNOSIS — Z7984 Long term (current) use of oral hypoglycemic drugs: Secondary | ICD-10-CM

## 2019-11-09 DIAGNOSIS — Z832 Family history of diseases of the blood and blood-forming organs and certain disorders involving the immune mechanism: Secondary | ICD-10-CM | POA: Diagnosis not present

## 2019-11-09 DIAGNOSIS — R6510 Systemic inflammatory response syndrome (SIRS) of non-infectious origin without acute organ dysfunction: Secondary | ICD-10-CM | POA: Diagnosis not present

## 2019-11-09 DIAGNOSIS — J90 Pleural effusion, not elsewhere classified: Secondary | ICD-10-CM | POA: Diagnosis not present

## 2019-11-09 DIAGNOSIS — Z6841 Body Mass Index (BMI) 40.0 and over, adult: Secondary | ICD-10-CM | POA: Diagnosis not present

## 2019-11-09 DIAGNOSIS — N179 Acute kidney failure, unspecified: Secondary | ICD-10-CM | POA: Diagnosis not present

## 2019-11-09 DIAGNOSIS — I110 Hypertensive heart disease with heart failure: Secondary | ICD-10-CM | POA: Diagnosis present

## 2019-11-09 DIAGNOSIS — Z7902 Long term (current) use of antithrombotics/antiplatelets: Secondary | ICD-10-CM | POA: Diagnosis not present

## 2019-11-09 DIAGNOSIS — E079 Disorder of thyroid, unspecified: Secondary | ICD-10-CM | POA: Diagnosis present

## 2019-11-09 DIAGNOSIS — Z79899 Other long term (current) drug therapy: Secondary | ICD-10-CM | POA: Diagnosis not present

## 2019-11-09 DIAGNOSIS — E1165 Type 2 diabetes mellitus with hyperglycemia: Secondary | ICD-10-CM | POA: Diagnosis not present

## 2019-11-09 DIAGNOSIS — E890 Postprocedural hypothyroidism: Secondary | ICD-10-CM | POA: Diagnosis not present

## 2019-11-09 DIAGNOSIS — IMO0001 Reserved for inherently not codable concepts without codable children: Secondary | ICD-10-CM | POA: Diagnosis present

## 2019-11-09 DIAGNOSIS — E785 Hyperlipidemia, unspecified: Secondary | ICD-10-CM | POA: Diagnosis not present

## 2019-11-09 DIAGNOSIS — Z954 Presence of other heart-valve replacement: Secondary | ICD-10-CM | POA: Diagnosis not present

## 2019-11-09 DIAGNOSIS — J9811 Atelectasis: Secondary | ICD-10-CM | POA: Diagnosis not present

## 2019-11-09 DIAGNOSIS — I350 Nonrheumatic aortic (valve) stenosis: Secondary | ICD-10-CM

## 2019-11-09 DIAGNOSIS — I4510 Unspecified right bundle-branch block: Secondary | ICD-10-CM | POA: Diagnosis present

## 2019-11-09 DIAGNOSIS — Z87891 Personal history of nicotine dependence: Secondary | ICD-10-CM

## 2019-11-09 DIAGNOSIS — I5022 Chronic systolic (congestive) heart failure: Secondary | ICD-10-CM | POA: Diagnosis present

## 2019-11-09 DIAGNOSIS — A419 Sepsis, unspecified organism: Secondary | ICD-10-CM | POA: Diagnosis not present

## 2019-11-09 DIAGNOSIS — Z794 Long term (current) use of insulin: Secondary | ICD-10-CM | POA: Diagnosis not present

## 2019-11-09 DIAGNOSIS — Z7982 Long term (current) use of aspirin: Secondary | ICD-10-CM | POA: Diagnosis not present

## 2019-11-09 DIAGNOSIS — M170 Bilateral primary osteoarthritis of knee: Secondary | ICD-10-CM | POA: Diagnosis not present

## 2019-11-09 DIAGNOSIS — R509 Fever, unspecified: Secondary | ICD-10-CM | POA: Diagnosis not present

## 2019-11-09 DIAGNOSIS — E1169 Type 2 diabetes mellitus with other specified complication: Secondary | ICD-10-CM | POA: Diagnosis not present

## 2019-11-09 DIAGNOSIS — R000 Tachycardia, unspecified: Secondary | ICD-10-CM | POA: Diagnosis not present

## 2019-11-09 DIAGNOSIS — D6489 Other specified anemias: Secondary | ICD-10-CM | POA: Diagnosis present

## 2019-11-09 DIAGNOSIS — N390 Urinary tract infection, site not specified: Secondary | ICD-10-CM | POA: Diagnosis not present

## 2019-11-09 DIAGNOSIS — I5042 Chronic combined systolic (congestive) and diastolic (congestive) heart failure: Secondary | ICD-10-CM | POA: Diagnosis not present

## 2019-11-09 DIAGNOSIS — Z87440 Personal history of urinary (tract) infections: Secondary | ICD-10-CM | POA: Diagnosis not present

## 2019-11-09 DIAGNOSIS — I5032 Chronic diastolic (congestive) heart failure: Secondary | ICD-10-CM | POA: Diagnosis present

## 2019-11-09 DIAGNOSIS — I517 Cardiomegaly: Secondary | ICD-10-CM | POA: Diagnosis not present

## 2019-11-09 DIAGNOSIS — Z953 Presence of xenogenic heart valve: Secondary | ICD-10-CM | POA: Diagnosis not present

## 2019-11-09 DIAGNOSIS — Z8249 Family history of ischemic heart disease and other diseases of the circulatory system: Secondary | ICD-10-CM

## 2019-11-09 DIAGNOSIS — Z888 Allergy status to other drugs, medicaments and biological substances status: Secondary | ICD-10-CM | POA: Diagnosis not present

## 2019-11-09 DIAGNOSIS — Z880 Allergy status to penicillin: Secondary | ICD-10-CM | POA: Diagnosis not present

## 2019-11-09 HISTORY — DX: Presence of prosthetic heart valve: Z95.2

## 2019-11-09 HISTORY — PX: TRANSCATHETER AORTIC VALVE REPLACEMENT, TRANSFEMORAL: SHX6400

## 2019-11-09 HISTORY — PX: INTRAOPERATIVE TRANSTHORACIC ECHOCARDIOGRAM: SHX6523

## 2019-11-09 LAB — POCT I-STAT, CHEM 8
BUN: 25 mg/dL — ABNORMAL HIGH (ref 8–23)
BUN: 25 mg/dL — ABNORMAL HIGH (ref 8–23)
BUN: 25 mg/dL — ABNORMAL HIGH (ref 8–23)
Calcium, Ion: 1.31 mmol/L (ref 1.15–1.40)
Calcium, Ion: 1.34 mmol/L (ref 1.15–1.40)
Calcium, Ion: 1.27 mmol/L (ref 1.15–1.40)
Chloride: 103 mmol/L (ref 98–111)
Chloride: 103 mmol/L (ref 98–111)
Chloride: 105 mmol/L (ref 98–111)
Creatinine, Ser: 0.8 mg/dL (ref 0.44–1.00)
Creatinine, Ser: 0.9 mg/dL (ref 0.44–1.00)
Creatinine, Ser: 0.8 mg/dL (ref 0.44–1.00)
Glucose, Bld: 198 mg/dL — ABNORMAL HIGH (ref 70–99)
Glucose, Bld: 251 mg/dL — ABNORMAL HIGH (ref 70–99)
Glucose, Bld: 253 mg/dL — ABNORMAL HIGH (ref 70–99)
HCT: 31 % — ABNORMAL LOW (ref 36.0–46.0)
HCT: 31 % — ABNORMAL LOW (ref 36.0–46.0)
HCT: 30 % — ABNORMAL LOW (ref 36.0–46.0)
Hemoglobin: 10.5 g/dL — ABNORMAL LOW (ref 12.0–15.0)
Hemoglobin: 10.5 g/dL — ABNORMAL LOW (ref 12.0–15.0)
Hemoglobin: 10.2 g/dL — ABNORMAL LOW (ref 12.0–15.0)
Potassium: 3.9 mmol/L (ref 3.5–5.1)
Potassium: 4.3 mmol/L (ref 3.5–5.1)
Potassium: 4.4 mmol/L (ref 3.5–5.1)
Sodium: 141 mmol/L (ref 135–145)
Sodium: 139 mmol/L (ref 135–145)
Sodium: 141 mmol/L (ref 135–145)
TCO2: 25 mmol/L (ref 22–32)
TCO2: 27 mmol/L (ref 22–32)
TCO2: 23 mmol/L (ref 22–32)

## 2019-11-09 LAB — POCT I-STAT 7, (LYTES, BLD GAS, ICA,H+H)
Acid-Base Excess: 0 mmol/L (ref 0.0–2.0)
Bicarbonate: 25.6 mmol/L (ref 20.0–28.0)
Calcium, Ion: 1.3 mmol/L (ref 1.15–1.40)
HCT: 29 % — ABNORMAL LOW (ref 36.0–46.0)
Hemoglobin: 9.9 g/dL — ABNORMAL LOW (ref 12.0–15.0)
O2 Saturation: 100 %
Potassium: 3.9 mmol/L (ref 3.5–5.1)
Sodium: 141 mmol/L (ref 135–145)
TCO2: 27 mmol/L (ref 22–32)
pCO2 arterial: 47.6 mmHg (ref 32.0–48.0)
pH, Arterial: 7.338 — ABNORMAL LOW (ref 7.350–7.450)
pO2, Arterial: 290 mmHg — ABNORMAL HIGH (ref 83.0–108.0)

## 2019-11-09 LAB — GLUCOSE, CAPILLARY
Glucose-Capillary: 185 mg/dL — ABNORMAL HIGH (ref 70–99)
Glucose-Capillary: 181 mg/dL — ABNORMAL HIGH (ref 70–99)
Glucose-Capillary: 207 mg/dL — ABNORMAL HIGH (ref 70–99)
Glucose-Capillary: 244 mg/dL — ABNORMAL HIGH (ref 70–99)

## 2019-11-09 SURGERY — IMPLANTATION, AORTIC VALVE, TRANSCATHETER, FEMORAL APPROACH
Anesthesia: Monitor Anesthesia Care

## 2019-11-09 MED ORDER — LIDOCAINE HCL (PF) 1 % IJ SOLN
INTRAMUSCULAR | Status: DC | PRN
  Administered 2019-11-09: 6 mL

## 2019-11-09 MED ORDER — CHLORHEXIDINE GLUCONATE 0.12 % MT SOLN
OROMUCOSAL | Status: AC
  Administered 2019-11-09: 15 mL via OROMUCOSAL
  Filled 2019-11-09: qty 15

## 2019-11-09 MED ORDER — SODIUM CHLORIDE 0.9 % IV SOLN
INTRAVENOUS | Status: AC
  Filled 2019-11-09 (×2): qty 1.2

## 2019-11-09 MED ORDER — CLOPIDOGREL BISULFATE 75 MG PO TABS
75.0000 mg | ORAL_TABLET | Freq: Every day | ORAL | Status: DC
  Administered 2019-11-10: 75 mg via ORAL
  Filled 2019-11-09: qty 1

## 2019-11-09 MED ORDER — SODIUM CHLORIDE 0.9 % IV SOLN
INTRAVENOUS | Status: DC | PRN
Start: 2019-11-09 — End: 2019-11-09

## 2019-11-09 MED ORDER — ASPIRIN 81 MG PO CHEW
81.0000 mg | CHEWABLE_TABLET | Freq: Every day | ORAL | Status: DC
  Administered 2019-11-10: 81 mg via ORAL
  Filled 2019-11-09: qty 1

## 2019-11-09 MED ORDER — SODIUM CHLORIDE 0.9 % IV SOLN
INTRAVENOUS | Status: AC
  Filled 2019-11-09: qty 1.2

## 2019-11-09 MED ORDER — IODIXANOL 320 MG/ML IV SOLN
INTRAVENOUS | Status: DC | PRN
  Administered 2019-11-09: 150 mL

## 2019-11-09 MED ORDER — GABAPENTIN 100 MG PO CAPS: 100.0000 mg | ORAL_CAPSULE | Freq: Three times a day (TID) | ORAL | Status: DC | PRN

## 2019-11-09 MED ORDER — SODIUM CHLORIDE 0.9 % IV SOLN: INTRAVENOUS | Status: DC

## 2019-11-09 MED ORDER — PHENYLEPHRINE HCL-NACL 20-0.9 MG/250ML-% IV SOLN
0.0000 ug/min | INTRAVENOUS | Status: DC
  Filled 2019-11-09: qty 250

## 2019-11-09 MED ORDER — ONDANSETRON HCL 4 MG/2ML IJ SOLN
INTRAMUSCULAR | Status: DC | PRN
Start: 2019-11-09 — End: 2019-11-09
  Administered 2019-11-09: 4 mg via INTRAVENOUS

## 2019-11-09 MED ORDER — CHLORHEXIDINE GLUCONATE 4 % EX LIQD: 30.0000 mL | CUTANEOUS | Status: DC

## 2019-11-09 MED ORDER — PHENYLEPHRINE HCL-NACL 10-0.9 MG/250ML-% IV SOLN
INTRAVENOUS | Status: DC | PRN
  Administered 2019-11-09 (×2): 10 ug/min via INTRAVENOUS

## 2019-11-09 MED ORDER — NITROGLYCERIN 0.2 MG/ML ON CALL CATH LAB
INTRAVENOUS | Status: DC | PRN
Start: 2019-11-09 — End: 2019-11-09
  Administered 2019-11-09 (×2): .6 ug via INTRAVENOUS

## 2019-11-09 MED ORDER — LORATADINE 10 MG PO TABS: 10.0000 mg | ORAL_TABLET | Freq: Every day | ORAL | Status: DC | PRN

## 2019-11-09 MED ORDER — MORPHINE SULFATE (PF) 2 MG/ML IV SOLN: 1.0000 mg | INTRAVENOUS | Status: DC | PRN

## 2019-11-09 MED ORDER — OXYCODONE HCL 5 MG PO TABS
5.0000 mg | ORAL_TABLET | ORAL | Status: DC | PRN
  Administered 2019-11-10 (×2): 10 mg via ORAL
  Filled 2019-11-09 (×2): qty 2

## 2019-11-09 MED ORDER — SODIUM CHLORIDE 0.9% FLUSH: 3.0000 mL | INTRAVENOUS | Status: DC | PRN

## 2019-11-09 MED ORDER — ONDANSETRON HCL 4 MG/2ML IJ SOLN: 4.0000 mg | Freq: Four times a day (QID) | INTRAMUSCULAR | Status: DC | PRN

## 2019-11-09 MED ORDER — SIMVASTATIN 20 MG PO TABS
20.0000 mg | ORAL_TABLET | Freq: Every evening | ORAL | Status: DC
  Administered 2019-11-09: 20 mg via ORAL
  Filled 2019-11-09: qty 1

## 2019-11-09 MED ORDER — LEVOFLOXACIN IN D5W 750 MG/150ML IV SOLN
750.0000 mg | INTRAVENOUS | Status: AC
  Administered 2019-11-10: 750 mg via INTRAVENOUS
  Filled 2019-11-09: qty 150

## 2019-11-09 MED ORDER — NITROGLYCERIN IN D5W 200-5 MCG/ML-% IV SOLN: 0.0000 ug/min | INTRAVENOUS | Status: DC

## 2019-11-09 MED ORDER — 0.9 % SODIUM CHLORIDE (POUR BTL) OPTIME
TOPICAL | Status: DC | PRN
  Administered 2019-11-09: 1000 mL

## 2019-11-09 MED ORDER — LACTATED RINGERS IV SOLN
INTRAVENOUS | Status: DC | PRN
Start: 2019-11-09 — End: 2019-11-09

## 2019-11-09 MED ORDER — CHLORHEXIDINE GLUCONATE 0.12 % MT SOLN: 15.0000 mL | Freq: Once | OROMUCOSAL | Status: AC

## 2019-11-09 MED ORDER — TRAMADOL HCL 50 MG PO TABS: 50.0000 mg | ORAL_TABLET | ORAL | Status: DC | PRN

## 2019-11-09 MED ORDER — SODIUM CHLORIDE 0.9 % IV SOLN: 250.0000 mL | INTRAVENOUS | Status: DC | PRN

## 2019-11-09 MED ORDER — HEPARIN SODIUM (PORCINE) 1000 UNIT/ML IJ SOLN
INTRAMUSCULAR | Status: DC | PRN
  Administered 2019-11-09: 17000 [IU] via INTRAVENOUS

## 2019-11-09 MED ORDER — VANCOMYCIN HCL IN DEXTROSE 1-5 GM/200ML-% IV SOLN
1000.0000 mg | Freq: Once | INTRAVENOUS | Status: AC
  Administered 2019-11-10: 1000 mg via INTRAVENOUS
  Filled 2019-11-09: qty 200

## 2019-11-09 MED ORDER — PROTAMINE SULFATE 10 MG/ML IV SOLN
INTRAVENOUS | Status: AC
  Filled 2019-11-09: qty 25

## 2019-11-09 MED ORDER — ESMOLOL HCL 100 MG/10ML IV SOLN
INTRAVENOUS | Status: DC | PRN
  Administered 2019-11-09: 10 mg via INTRAVENOUS

## 2019-11-09 MED ORDER — LEVOCETIRIZINE DIHYDROCHLORIDE 5 MG PO TABS: 5.0000 mg | ORAL_TABLET | Freq: Every day | ORAL | Status: DC | PRN

## 2019-11-09 MED ORDER — SODIUM CHLORIDE 0.9 % IV SOLN: INTRAVENOUS | Status: AC

## 2019-11-09 MED ORDER — INSULIN ASPART 100 UNIT/ML ~~LOC~~ SOLN
0.0000 [IU] | Freq: Three times a day (TID) | SUBCUTANEOUS | Status: DC
  Administered 2019-11-09: 8 [IU] via SUBCUTANEOUS
  Administered 2019-11-10: 4 [IU] via SUBCUTANEOUS
  Administered 2019-11-10: 8 [IU] via SUBCUTANEOUS

## 2019-11-09 MED ORDER — LIDOCAINE HCL 1 % IJ SOLN
INTRAMUSCULAR | Status: AC
  Filled 2019-11-09: qty 20

## 2019-11-09 MED ORDER — SODIUM CHLORIDE 0.9% FLUSH
3.0000 mL | Freq: Two times a day (BID) | INTRAVENOUS | Status: DC
  Administered 2019-11-10 (×2): 3 mL via INTRAVENOUS

## 2019-11-09 MED ORDER — SODIUM CHLORIDE 0.9 % IV SOLN
INTRAVENOUS | Status: DC | PRN
  Administered 2019-11-09: 1500 mL

## 2019-11-09 MED ORDER — ACETAMINOPHEN 650 MG RE SUPP: 650.0000 mg | Freq: Four times a day (QID) | RECTAL | Status: DC | PRN

## 2019-11-09 MED ORDER — ACETAMINOPHEN 325 MG PO TABS: 650.0000 mg | ORAL_TABLET | Freq: Four times a day (QID) | ORAL | Status: DC | PRN

## 2019-11-09 MED ORDER — ACETAMINOPHEN 500 MG PO TABS
1000.0000 mg | ORAL_TABLET | Freq: Once | ORAL | Status: AC
  Administered 2019-11-09: 1000 mg via ORAL
  Filled 2019-11-09: qty 2

## 2019-11-09 MED ORDER — SODIUM CHLORIDE 0.9 % IV SOLN: INTRAVENOUS | Status: DC | PRN

## 2019-11-09 MED ORDER — PROTAMINE SULFATE 10 MG/ML IV SOLN
INTRAVENOUS | Status: DC | PRN
  Administered 2019-11-09: 30 mg via INTRAVENOUS
  Administered 2019-11-09: 50 mg via INTRAVENOUS
  Administered 2019-11-09 (×3): 30 mg via INTRAVENOUS

## 2019-11-09 SURGICAL SUPPLY — 57 items
BAG DECANTER FOR FLEXI CONT (MISCELLANEOUS) ×3 IMPLANT
BAG SNAP BAND KOVER 36X36 (MISCELLANEOUS) ×3 IMPLANT
BLADE CLIPPER SURG (BLADE) ×3 IMPLANT
CABLE ADAPT CONN TEMP 6FT (ADAPTER) ×3 IMPLANT
CATH DIAG EXPO 6F AL1 (CATHETERS) ×3 IMPLANT
CATH DIAG EXPO 6F VENT PIG 145 (CATHETERS) ×6 IMPLANT
CATH INFINITI 6F AL2 (CATHETERS) IMPLANT
CATH S G BIP PACING (CATHETERS) ×3 IMPLANT
CHLORAPREP W/TINT 26 (MISCELLANEOUS) ×3 IMPLANT
CLOSURE MYNX CONTROL 6F/7F (Vascular Products) ×3 IMPLANT
CNTNR URN SCR LID CUP LEK RST (MISCELLANEOUS) ×2 IMPLANT
CONT SPEC 4OZ STRL OR WHT (MISCELLANEOUS) ×4
COVER BACK TABLE 80X110 HD (DRAPES) ×3 IMPLANT
DECANTER SPIKE VIAL GLASS SM (MISCELLANEOUS) ×3 IMPLANT
DERMABOND ADVANCED (GAUZE/BANDAGES/DRESSINGS) ×2
DERMABOND ADVANCED .7 DNX12 (GAUZE/BANDAGES/DRESSINGS) ×1 IMPLANT
DEVICE CLOSURE PERCLS PRGLD 6F (VASCULAR PRODUCTS) ×2 IMPLANT
DRSG TEGADERM 4X4.75 (GAUZE/BANDAGES/DRESSINGS) ×6 IMPLANT
ELECT REM PT RETURN 9FT ADLT (ELECTROSURGICAL) ×3
ELECTRODE REM PT RTRN 9FT ADLT (ELECTROSURGICAL) ×1 IMPLANT
GAUZE SPONGE 2X2 8PLY STRL LF (GAUZE/BANDAGES/DRESSINGS) ×1 IMPLANT
GAUZE SPONGE 4X4 12PLY STRL (GAUZE/BANDAGES/DRESSINGS) ×3 IMPLANT
GLOVE BIO SURGEON STRL SZ7.5 (GLOVE) ×3 IMPLANT
GLOVE TRIUMPH SURG SIZE 7.0 (KITS) ×3 IMPLANT
GOWN STRL REUS W/ TWL LRG LVL3 (GOWN DISPOSABLE) ×4 IMPLANT
GOWN STRL REUS W/ TWL XL LVL3 (GOWN DISPOSABLE) ×1 IMPLANT
GOWN STRL REUS W/TWL LRG LVL3 (GOWN DISPOSABLE) ×8
GOWN STRL REUS W/TWL XL LVL3 (GOWN DISPOSABLE) ×2
GUIDEWIRE SAFE TJ AMPLATZ EXST (WIRE) ×3 IMPLANT
KIT BASIN OR (CUSTOM PROCEDURE TRAY) ×3 IMPLANT
KIT HEART LEFT (KITS) ×3 IMPLANT
KIT TURNOVER KIT B (KITS) ×3 IMPLANT
LOOP VESSEL MAXI BLUE (MISCELLANEOUS) IMPLANT
LOOP VESSEL MINI RED (MISCELLANEOUS) IMPLANT
NS IRRIG 1000ML POUR BTL (IV SOLUTION) ×3 IMPLANT
PACK ENDO MINOR (CUSTOM PROCEDURE TRAY) ×3 IMPLANT
PAD ARMBOARD 7.5X6 YLW CONV (MISCELLANEOUS) ×6 IMPLANT
PAD ELECT DEFIB RADIOL ZOLL (MISCELLANEOUS) ×3 IMPLANT
PERCLOSE PROGLIDE 6F (VASCULAR PRODUCTS) ×6
POSITIONER HEAD DONUT 9IN (MISCELLANEOUS) ×3 IMPLANT
SET MICROPUNCTURE 5F STIFF (MISCELLANEOUS) ×3 IMPLANT
SHEATH BRITE TIP 7FR 35CM (SHEATH) ×3 IMPLANT
SHEATH PINNACLE 6F 10CM (SHEATH) ×3 IMPLANT
SHEATH PINNACLE 8F 10CM (SHEATH) ×3 IMPLANT
SLEEVE REPOSITIONING LENGTH 30 (MISCELLANEOUS) ×3 IMPLANT
SPONGE GAUZE 2X2 STER 10/PKG (GAUZE/BANDAGES/DRESSINGS) ×2
STOPCOCK MORSE 400PSI 3WAY (MISCELLANEOUS) ×6 IMPLANT
SUT SILK  1 MH (SUTURE) ×2
SUT SILK 1 MH (SUTURE) ×1 IMPLANT
SYR 50ML LL SCALE MARK (SYRINGE) ×3 IMPLANT
SYR BULB IRRIG 60ML STRL (SYRINGE) IMPLANT
SYR MEDRAD MARK V 150ML (SYRINGE) ×3 IMPLANT
TOWEL GREEN STERILE (TOWEL DISPOSABLE) ×3 IMPLANT
TRANSDUCER W/STOPCOCK (MISCELLANEOUS) ×6 IMPLANT
VALVE 26 ULTRA SAPIEN KIT (Valve) ×3 IMPLANT
WIRE EMERALD 3MM-J .035X150CM (WIRE) ×9 IMPLANT
WIRE EMERALD 3MM-J .035X260CM (WIRE) ×3 IMPLANT

## 2019-11-09 NOTE — Progress Notes (Signed)
Echocardiogram 2D Echocardiogram has been performed.  Oneal Deputy Ireland Chagnon 11/09/2019, 3:55 PM

## 2019-11-09 NOTE — Anesthesia Procedure Notes (Signed)
Procedure Name: MAC Date/Time: 11/09/2019 2:00 PM Performed by: Amadeo Garnet, CRNA Pre-anesthesia Checklist: Patient identified, Emergency Drugs available, Suction available and Patient being monitored Patient Re-evaluated:Patient Re-evaluated prior to induction Oxygen Delivery Method: Simple face mask Preoxygenation: Pre-oxygenation with 100% oxygen Induction Type: IV induction Placement Confirmation: positive ETCO2 Dental Injury: Teeth and Oropharynx as per pre-operative assessment

## 2019-11-09 NOTE — Progress Notes (Signed)
Rt radial arterial line d/c'ed. Pressure held x 10 minutes. Gauze and tegaderm dressing. Level 0. Palpable rt radial pulse

## 2019-11-09 NOTE — H&P (Signed)
[address]       [place] [address]             [phone]      Cardiothoracic Surgery Admission History and Physical   Referring Provider is End, Harrell Gave, MD  Primary Cardiologist is No primary care provider on file.  PCP is [place] Devonne Doughty, DO      Chief Complaint  Patient presents with  . Aortic Stenosis       HPI:  The patient is a 74 year old [place] with a history of hypertension, hyperlipidemia, diabetes, anemia on iron replacement therapy followed by Dr. Rogue Bussing at [facility] in [place] with regular iron infusions and aortic stenosis who presents with progressive exertional fatigue and shortness of breath. 2D echocardiogram in April 2019 showed mild to moderate aortic stenosis with a mean gradient of 21 mmHg and a peak gradient of 43 mmHg. Aortic valve area at that time was 1.25 cm with a dimensionless index of 0.35. A recent echocardiogram on 08/19/2019 showed an increase in the mean gradient to 32 mmHg with a peak gradient of 75 mmHg. Aortic valve area had decreased to 0.85 cm consistent with severe aortic stenosis. Left ventricular ejection fraction is 60 to 65%. She underwent cardiac catheterization on 10/19/2019 showing no evidence of coronary disease. The mean gradient by catheterization was 29 mmHg with an aortic valve area of 0.8 cm.  The patient came to the office with her daughter. She lives alone. She has decreased mobility due to spine disease and uses a walker when at home. When she is going outside the house she uses a wheelchair.       Past Medical History:  Diagnosis Date  . Anemia   . Benign tumor   . Diabetes mellitus without complication ([place])   . Hypertension   . Nausea   . Patient is [name]   . Severe aortic stenosis   . Thyroid disease         Past Surgical History:  Procedure Laterality Date  . COLONOSCOPY    . COLONOSCOPY WITH PROPOFOL N/A 03/13/2015   Procedure:  COLONOSCOPY WITH PROPOFOL; Surgeon: Manya Silvas, MD; Location: [facility] ENDOSCOPY; Service: Endoscopy; Laterality: N/A;  . ESOPHAGOGASTRODUODENOSCOPY N/A 03/13/2015   Procedure: ESOPHAGOGASTRODUODENOSCOPY (EGD); Surgeon: Manya Silvas, MD; Location: [facility] ENDOSCOPY; Service: Endoscopy; Laterality: N/A;  . RIGHT/LEFT HEART CATH AND CORONARY ANGIOGRAPHY N/A 10/19/2019   Procedure: RIGHT/LEFT HEART CATH AND CORONARY ANGIOGRAPHY; Surgeon: Nelva Bush, MD; Location: [facility]; Service: Cardiovascular; Laterality: N/A;  . THYROIDECTOMY    . tumor removed          Family History  Problem Relation Age of Onset  . Parkinson's disease Mother   . Heart disease Father   . Lupus Sister   . Aneurysm Son   . Lupus Son   . Breast cancer Maternal Aunt    mat great aunt   Social History        Socioeconomic History  . Marital status: Divorced    Spouse name: Not on file  . Number of children: 2  . Years of education: 24  . Highest education level: 12th grade  Occupational History  . Occupation: retired-worked with nandicapped adults  Tobacco Use  . Smoking status: Former Smoker    Years: 20.00    Types: Cigarettes    Quit date: 09/05/1979    Years since quitting: 40.1  . Smokeless tobacco: Former Network engineer  .  Vaping Use: Never used  Substance and Sexual Activity  . Alcohol use: Yes    Alcohol/week: 0.0 - 1.0 standard drinks    Comment: occ. wine  . Drug use: No  . Sexual activity: Not on file  Other Topics Concern  . Not on file  Social History Narrative  . Not on file   Social Determinants of Health      Financial Resource Strain: Low Risk   . Difficulty of Paying Living Expenses: Not hard at all  Food Insecurity: No Food Insecurity  . Worried About Charity fundraiser in the Last Year: Never true  . Ran Out of Food in the Last Year: Never true  Transportation Needs: No Transportation Needs  . Lack of Transportation (Medical): No  . Lack of  Transportation (Non-Medical): No  Physical Activity: Inactive  . Days of Exercise per Week: 0 days  . Minutes of Exercise per Session: 0 min  Stress: No Stress Concern Present  . Feeling of Stress : Not at all  Social Connections: Moderately Integrated  . Frequency of Communication with Friends and Family: More than three times a week  . Frequency of Social Gatherings with Friends and Family: Never  . Attends Religious Services: More than 4 times per year  . Active Member of Clubs or Organizations: Yes  . Attends Archivist Meetings: More than 4 times per year  . Marital Status: Divorced  Human resources officer Violence: Not At Risk  . Fear of Current or Ex-Partner: No  . Emotionally Abused: No  . Physically Abused: No  . Sexually Abused: No         Current Outpatient Medications  Medication Sig Dispense Refill  . acetaminophen (TYLENOL) 500 MG tablet Take 1,000 mg by mouth every 6 (six) hours as needed for moderate pain.     . Artificial Tear Solution (GENTEAL TEARS) 0.1-0.2-0.3 % SOLN Place 1 drop into both eyes 3 (three) times daily as needed (dry/irritated eyes.).    Marland Kitchen B COMPLEX-C PO Take 10 mLs by mouth daily.    . baclofen (LIORESAL) 10 MG tablet TAKE 1 TO 2 TABLETS BY MOUTH TWICE DAILY AS NEEDED FOR MUSCLE SPASM (Patient taking differently: Take 10-20 mg by mouth 2 (two) times daily as needed for muscle spasms. ) 60 tablet 2  . Blood Glucose Monitoring Suppl (ACCU-CHEK NANO SMARTVIEW) w/Device KIT Use to check blood glucose up to once daily as instructed 1 kit 0  . Cholecalciferol (VITAMIN D3) 125 MCG (5000 UT) TABS Take 5,000 Units by mouth daily.    . Ferrous Sulfate (IRON) 325 (65 Fe) MG TABS TAKE 1 TABLET BY MOUTH TWICE DAILY WITH A MEAL (Patient taking differently: Take 325 mg by mouth in the morning and at bedtime. ) 180 each 1  . furosemide (LASIX) 20 MG tablet Take 1 tablet (20 mg total) by mouth daily as needed for edema. (Patient taking differently: Take 20 mg by  mouth daily as needed for edema (fluid retention). ) 45 tablet 6  . gabapentin (NEURONTIN) 100 MG capsule Start 1 capsule daily, increase by 1 cap every 2-3 days as tolerated up to 3 times a day, or may take 3 at once in evening. (Patient taking differently: Take 100-300 mg by mouth 3 (three) times daily as needed (shingles pain.). ) 90 capsule 1  . glucose blood (ACCU-CHEK SMARTVIEW) test strip Check blood glucose up to once daily as instructed 100 each 5  . hydrochlorothiazide (HYDRODIURIL) 25 MG tablet Take  1 tablet by mouth once daily (Patient taking differently: Take 25 mg by mouth daily. ) 90 tablet 3  . lactobacillus acidophilus (BACID) TABS tablet Take 2 tablets by mouth 2 (two) times daily as needed (digestive health.).     Marland Kitchen levocetirizine (XYZAL) 5 MG tablet Take 1 tablet (5 mg total) by mouth every evening. (Patient taking differently: Take 5 mg by mouth daily as needed (allergies.). ) 90 tablet 3  . losartan (COZAAR) 50 MG tablet Take 1 tablet by mouth once daily (Patient taking differently: Take 50 mg by mouth daily. ) 90 tablet 1  . metFORMIN (GLUCOPHAGE) 500 MG tablet Take 1 tablet (500 mg total) by mouth daily with supper. 90 tablet 1  . metoprolol tartrate (LOPRESSOR) 50 MG tablet Take as directed prior to pre TAVR CT scans 1 tablet 0  . mupirocin ointment (BACTROBAN) 2 % Apply 1 application topically 2 (two) times daily. 22 g 0  . simvastatin (ZOCOR) 20 MG tablet TAKE 1 TABLET BY MOUTH ONCE DAILY AT 6 PM (Patient taking differently: Take 20 mg by mouth every evening. ) 90 tablet 0   No current facility-administered medications for this visit.        Allergies  Allergen Reactions  . Ferumoxytol Swelling    Swelling in neck  . Other     Other reaction(s): Other (See Comments)  She refuses blood products-Jehovah witness  . Penicillins Rash, Swelling and Hives   Review of Systems:   General: normal appetite, + decreased energy, no weight gain, no weight loss, no fever    Cardiac: no chest pain with exertion, no chest pain at rest, +SOB with mild exertion, no resting SOB, no PND, + orthopnea, no palpitations, no arrhythmia, no atrial fibrillation, + LE edema, no dizzy spells, no syncope  Respiratory: + exertional shortness of breath, no home oxygen, no productive cough, + dry cough, no bronchitis, no wheezing, no hemoptysis, no asthma, no pain with inspiration or cough, no sleep apnea, no CPAP at night  GI: no difficulty swallowing, no reflux, no frequent heartburn, no hiatal hernia, no abdominal pain, no constipation, no diarrhea, no hematochezia, no hematemesis, no melena  GU: no dysuria, no frequency, no urinary tract infection, no hematuria, no kidney stones, no kidney disease  Vascular: no pain suggestive of claudication, no pain in feet, no leg cramps, no varicose veins, no DVT, no non-healing foot ulcer  Neuro: no stroke, no TIA's, no seizures, no headaches, no temporary blindness one eye, no slurred speech, no peripheral neuropathy, + chronic pain, + instability of gait, no memory/cognitive dysfunction  Musculoskeletal: + arthritis, + joint swelling, + myalgias, + difficulty walking, + reduced mobility  Skin: no rash, no itching, no skin infections, no pressure sores or ulcerations  Psych: no anxiety, no depression, no nervousness, no unusual recent stress  Eyes: + blurry vision, + floaters, no recent vision changes, + wears glasses or contacts  ENT: no hearing loss, no loose or painful teeth, no dentures, last saw dentist every 6 months  Hematologic: no easy bruising, no abnormal bleeding, no clotting disorder, no frequent epistaxis  Endocrine: + diabetes, does check CBG's at home    Physical Exam:   BP (!) 148/80 (BP Location: Left Arm, Patient Position: Sitting, Cuff Size: Large)  Pulse 79  Temp 98.5 F (36.9 C)  Resp 18  Ht 5' (1.524 m)  Wt 235 lb (106.6 kg)  SpO2 96% Comment: RA  BMI 45.90 kg/m  General: well-appearing in wheel chair  HEENT:  Unremarkable, NCAT, PERLA, EOMI  Neck: no JVD, no bruits, no adenopathy  Chest: clear to auscultation, symmetrical breath sounds, no wheezes, no rhonchi  CV: RRR, grade lll/VI crescendo/decrescendo murmur heard best at RSB, no diastolic murmur  Abdomen: soft, non-tender, no masses  Extremities: warm, well-perfused, pulses not palpable, moderate LE edema bilaterally.  Rectal/GU Deferred  Neuro: Grossly non-focal and symmetrical throughout  Skin: Clean and dry, no rashes, no breakdown    Diagnostic Tests:   ECHOCARDIOGRAM REPORT     Patient Name: EARLE BURSON Date of Exam: 08/19/2019  Medical Rec #: [id number] Height: 62.0 in  Accession #: [id number] Weight: 225.0 lb  Date of Birth: Jun 27, 1945 BSA: 2.010 m  Patient Age: 23 years BP: 146/84 mmHg  Patient Gender: F HR: 80 bpm.  Exam Location: [facility]   Procedure: 2D Echo, Cardiac Doppler, Color Doppler and Intracardiac  Opacification Agent   Indications: I35.0 Nonrheumatic aortic (valve) stenosis   History: Patient has prior history of Echocardiogram examinations,  most  recent 08/28/2017. TIA, Aortic Valve Disease; Risk  Factors:Hypertension, Diabetes, Dyslipidemia and Former  Smoker.   Sonographer: Pilar Jarvis RDMS, RVT, RDCS  Referring Phys: [place]    1. Left ventricular ejection fraction, by estimation, is 60 to 65%. The  left ventricle has normal function. The left ventricle has no regional  wall motion abnormalities. There is moderate left ventricular hypertrophy.  Left ventricular diastolic  parameters are consistent with Grade I diastolic dysfunction (impaired  relaxation).  2. Right ventricular systolic function is normal. The right ventricular  size is normal.  3. Left atrial size was mildly dilated.  4. The aortic valve is normal in structure. Aortic valve regurgitation is  mild. Moderate to severe aortic valve stenosis visually and by mean  gradient. Aortic valve mean  gradient measures 32.0 mmHg. Severe stenosis  by Aortic valve area, by VTI measures  0.85 cm and peak velocity 4.33 m/sec   FINDINGS  Left Ventricle: Left ventricular ejection fraction, by estimation, is 60  to 65%. The left ventricle has normal function. The left ventricle has no  regional wall motion abnormalities. Definity contrast agent was given IV  to delineate the left ventricular  endocardial borders. The left ventricular internal cavity size was normal  in size. There is moderate left ventricular hypertrophy. Left ventricular  diastolic parameters are consistent with Grade I diastolic dysfunction  (impaired relaxation).   Right Ventricle: The right ventricular size is normal. No increase in  right ventricular wall thickness. Right ventricular systolic function is  normal.   Left Atrium: Left atrial size was mildly dilated.   Right Atrium: Right atrial size was normal in size.   Pericardium: There is no evidence of pericardial effusion.   Mitral Valve: The mitral valve is normal in structure. There is mild  thickening of the mitral valve leaflet(s). Normal mobility of the mitral  valve leaflets. No evidence of mitral valve regurgitation. No evidence of  mitral valve stenosis.   Tricuspid Valve: The tricuspid valve is normal in structure. Tricuspid  valve regurgitation is not demonstrated. No evidence of tricuspid  stenosis.   Aortic Valve: The aortic valve is normal in structure. Aortic valve  regurgitation is mild. Aortic regurgitation PHT measures 440 msec.  Moderate to severe aortic stenosis is present. Aortic valve mean gradient  measures 32.0 mmHg. Aortic valve peak  gradient measures 75.0 mmHg. Aortic valve area, by VTI measures 0.85 cm.   Pulmonic Valve: The pulmonic valve  was normal in structure. Pulmonic valve  regurgitation is trivial. No evidence of pulmonic stenosis.   Aorta: The aortic root is normal in size and structure.   Venous: The inferior vena  cava is normal in size with greater than 50%  respiratory variability, suggesting right atrial pressure of 3 mmHg.   IAS/Shunts: No atrial level shunt detected by color flow Doppler.    LEFT VENTRICLE  PLAX 2D  LVIDd: 4.70 cm Diastology  LVIDs: 3.40 cm LV e' lateral: 5.98 cm/s  LV PW: 1.00 cm LV E/e' lateral: 13.7  LV IVS: 1.30 cm LV e' medial: 7.29 cm/s  LVOT diam: 2.10 cm LV E/e' medial: 11.2  LV SV: 71  LV SV Index: 35  LVOT Area: 3.46 cm   3D Volume EF:  LV Volumes (MOD) 3D EF: 66 %  LV vol d, MOD A4C: 94.2 ml LV EDV: 113 ml  LV vol s, MOD A4C: 39.5 ml LV ESV: 38 ml  LV SV MOD A4C: 94.2 ml LV SV: 75 ml   RIGHT VENTRICLE IVC  RV S prime: 11.30 cm/s IVC diam: 0.90 cm  TAPSE (M-mode): 2.2 cm   LEFT ATRIUM Index RIGHT ATRIUM Index  LA diam: 4.00 cm 1.99 cm/m RA Area: 10.80 cm  LA Vol (A2C): 42.5 ml 21.15 ml/m RA Volume: 21.80 ml 10.85 ml/m  LA Vol (A4C): 44.0 ml 21.89 ml/m  LA Biplane Vol: 43.3 ml 21.54 ml/m  AORTIC VALVE PULMONIC VALVE  AV Area (Vmax): 0.76 cm PV Vmax: 1.13 m/s  AV Area (Vmean): 0.69 cm PV Peak grad: 5.1 mmHg  AV Area (VTI): 0.85 cm  AV Vmax: 433.00 cm/s  AV Vmean: 295.000 cm/s  AV VTI: 0.835 m  AV Peak Grad: 75.0 mmHg  AV Mean Grad: 32.0 mmHg  LVOT Vmax: 95.30 cm/s  LVOT Vmean: 58.700 cm/s  LVOT VTI: 0.204 m  LVOT/AV VTI ratio: 0.24  AI PHT: 440 msec   AORTA  Ao Root diam: 2.90 cm  Ao Asc diam: 3.10 cm  Ao Arch diam: 2.0 cm   MITRAL VALVE  MV Area (PHT): 3.13 cm SHUNTS  MV Decel Time: 242 msec Systemic VTI: 0.20 m  MV E velocity: 82.00 cm/s Systemic Diam: 2.10 cm  MV A velocity: 103.00 cm/s  MV E/A ratio: 0.80   Ida Rogue MD  Electronically signed by Ida Rogue MD  Signature Date/Time: 08/20/2019/6:08:22 PM     Panel Physicians Referring Physician Case Authorizing Physician  End, Harrell Gave, MD (Primary)    Procedures  RIGHT/LEFT HEART CATH AND CORONARY ANGIOGRAPHY  Conclusion  Conclusions:  1. No  angiographically significant coronary artery disease. 2. Moderate to severe aortic stenosis (mean gradient 29 mmHg, AVA 0.8 cm^2). 3. Mildly elevated right heart filling pressure. 4. Upper normal to mildly elevated left heart filling pressure. 5. Normal Fick cardiac output/index. Recommendations:  1. Refer to valve clinic for further workup/management of moderate to severe aortic stenosis. 2. Discontinue aspirin, given chronic anemia and lack of coronary artery disease. 3. Continue gentle diuresis. 4. Primary prevention of coronary artery disease. Nelva Bush, MD  [facility]  Recommendations  Antiplatelet/Anticoag No indication for antiplatelet therapy at this time .  Discharge Date In the absence of any other complications or medical issues, we expect the patient to be ready for discharge from a cath perspective on 10/19/2019.  Indications  Nonrheumatic aortic valve stenosis [I35.0 (ICD-10-CM)]  Chronic heart failure with preserved ejection fraction (HFpEF) ([place]) [I50.32 (ICD-10-CM)]  Procedural Details  Technical Details Indication: 74  y.o. year-old woman with history of aortic stenosis, chronic HFpEF, hypertension, type 2 diabetes mellitus, chronic anemia, and morbid obesity, presenting for evaluation of aortic stenosis and HFpEF.  GFR: >60 ml/min  Procedure: The risks, benefits, complications, treatment options, and expected outcomes were discussed with the patient. The patient and/or family concurred with the proposed plan, giving informed consent. The patient was sedated with IV midazolam and fentanyl. The right wrist was assessed with a modified Allens test which was normal. The right wrist and elbow were prepped and draped in a sterile fashion. 1% lidocaine was used for local anesthesia. A previously placed antecubital vein IV was exchanged for a 9365F slender Glidesheath using modified Seldinger technique. Right heart catheterization was performed by advancing a 9365F balloon-tipped  catheter through the right heart chambers into the pulmonary capillary wedge position. Pressure measurements and oxygen saturations were obtained.  Using the modified Seldinger access technique, a 46F slender Glidesheath was placed in the right radial artery. 3 mg Verapamil was given through the sheath. Heparin 5,000 units were administered. Selective coronary angiography was performed using 9365F JL3.5 and JR4 catheters to engage the left and right coronary arteries, respectively. Left heart catheterization was performed using a 9365F JR4 catheter and straight wire to cross the aortic valve. Left ventriculogram was not performed.  At the end of the procedure, the radial artery sheath was removed and a TR band applied to achieve patent hemostasis. The right antecubital vein sheath was removed and hemostasis achieved with manual compression. There were no immediate complications. The patient was taken to the recovery area in stable condition.  Estimated blood loss <50 mL.   During this procedure medications were administered to achieve and maintain moderate conscious sedation while the patient's heart rate, blood pressure, and oxygen saturation were continuously monitored and I was present face-to-face 100% of this time.  Medications  (Filter: Administrations occurring from 0749 to 0846 on 10/19/19)  midazolam (VERSED) injection (mg)  Total dose: 1 mg  Date/Time  Rate/Dose/Volume Action  10/19/19 0757  1 mg Given  fentaNYL (SUBLIMAZE) injection (mcg)  Total dose: 25 mcg  Date/Time  Rate/Dose/Volume Action  10/19/19 0757  25 mcg Given  verapamil (ISOPTIN) injection (mg)  Total dose: 2.5 mg  Date/Time  Rate/Dose/Volume Action  10/19/19 0817  2.5 mg Given  iohexol (OMNIPAQUE) 300 MG/ML solution (mL)  Total volume: 45 mL  Date/Time  Rate/Dose/Volume Action  10/19/19 0838  45 mL Given  heparin sodium (porcine) injection (Units)  Total dose: 5,000 Units  Date/Time  Rate/Dose/Volume Action  10/19/19  0820  5,000 Units Given  Heparin (Porcine) in NaCl 1000-0.9 UT/500ML-% SOLN (mL)  Total volume: 500 mL  Date/Time  Rate/Dose/Volume Action  10/19/19 0839  500 mL Given  Sedation Time  Sedation Time Physician-1: 39 minutes 13 seconds  Contrast  Medication Name Total Dose  iohexol (OMNIPAQUE) 300 MG/ML solution 45 mL  Radiation/Fluoro  Fluoro time: 4.2 (min)  DAP: 28 (Gycm2)  Cumulative Air Kerma: 694 (mGy)  Complications  Complications documented before study signed (10/19/2019 5:03 AM)   No complications were associated with this study.  Documented by Nelva Bush, MD - 10/19/2019 8:59 AM  Coronary Findings  Diagnostic  Dominance: Co-dominant  Left Main  Vessel is large. Vessel is angiographically normal.  Left Anterior Descending  Vessel is large. Vessel is angiographically normal.  First Diagonal Branch  Vessel is small in size.  Second Diagonal Branch  Vessel is small in size.  Third Diagonal Branch  Vessel is  moderate in size.  Ramus Intermedius  Vessel is small. Vessel is angiographically normal.  Left Circumflex  Vessel is large. Vessel is angiographically normal.  First Obtuse Marginal Branch  Vessel is large in size.  Second Obtuse Marginal Branch  Vessel is moderate in size.  First Left Posterolateral Branch  Vessel is small in size.  Second Left Posterolateral Branch  Vessel is small in size.  Third Left Posterolateral Branch  Vessel is small in size.  Right Coronary Artery  Vessel is moderate in size. Vessel is angiographically normal.  Right Posterior Descending Artery  Vessel is small in size.  Right Posterior Atrioventricular Artery  Vessel is small in size.  Intervention  No interventions have been documented.  Right Heart  Right Heart Pressures RA (mean): 12 mmHg RV (S/EDP): 28/12 mmHg PA (S/D, mean): 28/17 (21) mmHg PCWP (mean): 15 mmHg  Ao sat: 99% PA sat: 72%  Fick CO: 5.2 L/min Fick CI: 2.6 L/min/m^2  Left Heart  Left Ventricle LV  end diastolic pressure is mildly elevated. LVEDP ~20 mmHg.  Aortic Valve Moderate to severe aortic stenosis. Mean gradient: 29 mmHg. Calculated valve area: 0.8 cm^2.  Coronary Diagrams  Diagnostic  Dominance: Co-dominant   Intervention  Implants     No implant documentation for this case.  Syngo Images  Show images for CARDIAC CATHETERIZATION  Images on Long Term Storage  Show images for Cosman, Evelean Bigler to Procedure Log    Procedure Log  Hemo Data (last day) before discharge   AO Systolic Cath Pressure  AO Diastolic Cath Pressure  AO Mean Cath Pressure  LV Systolic Cath Pressure  LV End Diastolic  LV Systolic  LV End Diastolic  LV dP/dt  PA Systolic Cath Pressure  PA Diastolic Cath Pressure  PA Mean Cath Pressure  RA Wedge A Wave  RA Wedge V Wave  RV Systolic Cath Pressure  RV Diastolic Cath Pressure  RV End Diastolic  RV Systolic  RV End Diastolic  RV dP/dt  PCW A Wave  PCW V Wave  PCW Mean  AO O2 Sat  PA O2 Sat  AO O2 Sat  Fick C.O.  Fick C.I.   --  --  --  --  --  181 mmHg  22 mmHg  1536 mmHg/sec  --  --  --  11 mmHg  9 mmHg  --  --  --  26 mmHg  11 mmHg  288 mmHg/sec  13 mmHg  14 mmHg  12 mmHg  --  --  --  5.19 L/min  2.63 L/min/m2   --  --  --  --  --  --  --  --  --  --  --  --  --  26 mmHg  7 mmHg  11 mmHg  --  --  --  --  --  --  --  --  --  --  --   --  --  --  --  --  --  --  --  28 mmHg  16 mmHg  21 mmHg  --  --  --  --  --  --  --  --  --  --  --  --  --  --  --  --   141  71 mmHg  95 mmHg  --  --  --  --  --  --  --  --  --  --  --  --  --  --  --  --  --  --  --  --  --  --  --  --   --  --  --  181 mmHg  19 mmHg  --  --  --  --  --  --  --  --  --  --  --  --  --  --  --  --  --  --  --  --  --  --   --  --  --  144 mmHg  25 mmHg  --  --  --  --  --  --  --  --  --  --  --  --  --  --  --  --  --  --  --  --  --  --   --  --  --  173 mmHg  26 mmHg  --  --  --  --  --  --  --  --  --  --  --  --  --  --  --  --  --  --  --  --  --  --   --  --  --  181 mmHg  22  mmHg  --  --  --  --  --  --  --  --  --  --  --  --  --  --  --  --  --  --  --  --  --  --   153  75 mmHg  111 mmHg  --  --  --  --  --  --  --  --  --  --  --  --  --  --  --  --  --  --  --  --  --  --  --  --   --  --  --  --  --  --  --  --  --  --  --  --  --  --  --  --  --  --  --  --  --  --  99.8 %  --  SA  --  --   --  --  --  --  --  --  --                                            ADDENDUM REPORT: 11/02/2019 17:55  CLINICAL DATA: Severe Aortic Stenosis.  EXAM:  Cardiac TAVR CT  TECHNIQUE:  The patient was scanned on a [employer]. A 120 kV  retrospective scan was triggered in the descending thoracic aorta at  111 HU's. Gantry rotation speed was 250 msecs and collimation was .6  mm. No beta blockade or nitro were given. The 3D data set was  reconstructed in 5% intervals of the R-R cycle. Systolic and  diastolic phases were analyzed on a dedicated work station using  MPR, MIP and VRT modes. The patient received 80 cc of contrast.  FINDINGS:  Image quality: Excellent.  Noise artifact is: Limited.  Valve Morphology: The aortic valve is tricuspid. There is asymmetric  bulky calcification of the LCC extending to the leaflet base. The  leaflets exhibit severely restricted leaflet motion in systole  consistent with severe aortic stenosis.  Aortic Valve Calcium score: 1548  Aortic annular dimension:  Phase assessed: 15%  Annular area: 441 mm2  Annular perimeter: 75.9 mm  Max diameter: 26.5 mm  Min diameter: 23.2 mm  Annular and subannular calcification: No significant annular or  subannular calcifications.  Optimal coplanar projection: LAO 10 CRA 1  Coronary Artery Height above Annulus:  Left Main: 14.1 mm  Right Coronary: 16.7 mm  Sinus of Valsalva Measurements:  Non-coronary: 30 mm  Right-coronary: 29 mm  Left-coronary: 31 mm  Sinus of Valsalva Height:  Non-coronary: 20.9 mm  Right-coronary: 21.7 mm  Left-coronary: 19.8 mm  Sinotubular Junction: 30 mm.  Mild calcified plaque.  Ascending Thoracic Aorta: 32 mm.  Coronary Arteries: Normal coronary origin. Right dominance. The  study was performed without use of NTG and is insufficient for  plaque evaluation. Please refer to recent cardiac catheterization  for coronary assessment.  Cardiac Morphology:  Right Atrium: Right atrial size is within normal limits.  Right Ventricle: The right ventricular cavity is within normal  limits.  Left Atrium: Left atrial size is normal in size with no left atrial  appendage filling defect.  Left Ventricle: The ventricular cavity size is within normal limits.  There are no stigmata of prior infarction. There is no abnormal  filling defect. LVEF=69%.  Pulmonary arteries: Normal in size without proximal filling defect.  Pulmonary veins: Normal pulmonary venous drainage.  Pericardium: Normal thickness with no significant effusion or  calcium present.  Mitral Valve: The mitral valve is normal structure without  significant calcification.  Extra-cardiac findings: See attached radiology report for  non-cardiac structures.  IMPRESSION:  1. Annular measurements (441 mm2) appropriate for 26 mm Edwards  Sapien 3.  2. No significant annular or subannular calcifications.  3. Sufficient coronary to annulus distance.  4. Optimal Fluoroscopic Angle for Delivery: LAO 10 CRA 1  [place] T. Audie Box, MD  Electronically Signed  By: Eleonore Chiquito  On: 11/02/2019 17:55  CLINICAL DATA: 74 year old female with history of severe aortic  stenosis. Preprocedural study prior to potential transcatheter  aortic valve replacement (TAVR) procedure.  EXAM:  CT ANGIOGRAPHY CHEST, ABDOMEN AND PELVIS  TECHNIQUE:  Non-contrast CT of the chest was initially obtained.  Multidetector CT imaging through the chest, abdomen and pelvis was  performed using the standard protocol during bolus administration of  intravenous contrast. Multiplanar reconstructed images and MIPs were  obtained  and reviewed to evaluate the vascular anatomy.  CONTRAST: 134m OMNIPAQUE IOHEXOL 350 MG/ML SOLN  COMPARISON: No prior chest CT. CT the abdomen and pelvis  03/25/2019.  FINDINGS:  CTA CHEST FINDINGS  Cardiovascular: Heart size is mildly enlarged. There is no  significant pericardial fluid, thickening or pericardial  calcification. There is aortic atherosclerosis, as well as  atherosclerosis of the great vessels of the mediastinum and the  coronary arteries, including calcified atherosclerotic plaque in the  left main coronary artery. Thickening calcification of the aortic  valve.  Mediastinum/Lymph Nodes: No pathologically enlarged mediastinal or  hilar lymph nodes. Esophagus is unremarkable in appearance. No  axillary lymphadenopathy.  Lungs/Pleura: No acute consolidative airspace disease. No pleural  effusions. No suspicious appearing pulmonary nodules or masses are  noted.  Musculoskeletal/Soft Tissues: There are no aggressive appearing  lytic or blastic lesions noted in the visualized portions of the  skeleton.  CTA ABDOMEN AND PELVIS FINDINGS  Hepatobiliary: Diffuse low attenuation throughout the hepatic  parenchyma, indicative of a background of hepatic steatosis. Liver  has a slightly shrunken appearance and nodular contour, indicative  of underlying cirrhosis. No suspicious cystic or solid hepatic  lesions. No intra or extrahepatic biliary ductal dilatation.  Gallbladder is normal in appearance.  Pancreas: No pancreatic mass. No pancreatic no pancreatic ductal  dilatation or peripancreatic fluid collections or inflammatory  changes.  Spleen: Unremarkable.  Adrenals/Urinary Tract: Bilateral kidneys and  adrenal glands are  normal in appearance. No hydroureteronephrosis. Urinary bladder is  normal in appearance.  Stomach/Bowel: Normal appearance of the stomach. No pathologic  dilatation of small bowel or colon. A few scattered colonic  diverticulae are noted, without  surrounding inflammatory changes are  noted to suggest acute diverticulitis. Normal appendix.  Vascular/Lymphatic: Aortic atherosclerosis, with findings and  measurements pertinent to potential TAVR procedure, as detailed  below. No aneurysm or dissection noted in the abdominal or pelvic  vasculature. No lymphadenopathy noted in the abdomen or pelvis.  Reproductive: Uterus and ovaries are unremarkable in appearance.  Other: No significant volume of ascites. No pneumoperitoneum.  Musculoskeletal: There are no aggressive appearing lytic or blastic  lesions noted in the visualized portions of the skeleton.  VASCULAR MEASUREMENTS PERTINENT TO TAVR:  AORTA:  Minimal Aortic Diameter-16 x 16 mm  Severity of Aortic Calcification-mild  RIGHT PELVIS:  Right Common Iliac Artery -  Minimal Diameter-9.6 x 9.0 mm  Tortuosity-mild  Calcification-mild-to-moderate  Right External Iliac Artery -  Minimal Diameter-7.5 x 7.8 mm  Tortuosity-mild-to-moderate  Calcification-none  Right Common Femoral Artery -  Minimal Diameter-8.5 x 7.6 mm  Tortuosity-mild  Calcification-none  LEFT PELVIS:  Left Common Iliac Artery -  Minimal Diameter-9.7 x 10.3 mm  Tortuosity-mild  Calcification-mild  Left External Iliac Artery -  Minimal Diameter-7.7 x 6.9 mm  Tortuosity-mild-to-moderate  Calcification-none  Left Common Femoral Artery -  Minimal Diameter-8.3 x 7.4 mm  Tortuosity-mild  Calcification-mild  Review of the MIP images confirms the above findings.  IMPRESSION:  1. Vascular findings and measurements pertinent to potential TAVR  procedure, as detailed above.  2. Severe thickening calcification of the aortic valve, compatible  with the reported clinical history of severe aortic stenosis.  3. Mild cardiomegaly.  4. Aortic atherosclerosis, in addition to left main coronary artery  disease. Assessment for potential risk factor modification, dietary  therapy or pharmacologic therapy may be warranted,  if clinically  indicated.  5. Hepatic steatosis.  6. Morphologic changes in the liver suggesting early cirrhosis.  7. Colonic diverticulosis without evidence of acute diverticulitis  at this time.  8. Additional incidental findings, as above.  Electronically Signed  By: Vinnie Langton M.D.  On: 11/02/2019 12:10    STS Risk Score:   Risk of Mortality:  2.686%  Renal Failure:  5.513%  Permanent Stroke:  0.991%  Prolonged Ventilation:  14.985%  DSW Infection:  0.221%  Reoperation:  2.837%  Morbidity or Mortality:  19.874%  Short Length of Stay:  22.224%  Long Length of Stay:  10.422%   Impression:   This 74 year old [name] has stage D, severe, symptomatic aortic stenosis with New York Heart Association class II symptoms of exertional fatigue and shortness of breath consistent with chronic diastolic congestive heart failure. I have personally reviewed her 2D echocardiogram, cardiac catheterization, and CTA studies. Echocardiogram shows a trileaflet aortic valve with leaflet thickening and calcification and restricted mobility. The mean gradient is 32 mmHg with a peak velocity of 4.3 m/s. Aortic valve area of 0.85 cm consistent with severe aortic stenosis. There is mild aortic insufficiency. Left ventricular ejection fraction 60 to 65%. Cardiac catheterization shows no significant coronary disease. The mean gradient was measured at 29 mmHg with an aortic valve area of 0.8 cm consistent with severe aortic stenosis. I agree that aortic valve replacement is indicated in this patient for relief of her symptoms and to prevent progressive left ventricular deterioration. Given her age, morbid obesity, debilitation due to arthritis with  restricted mobility I think transcatheter aortic valve replacement would be the best option for her. Her gated cardiac CTA shows anatomy suitable for transcatheter aortic valve replacement using a SAPIEN 3 valve. Her abdominal and pelvic CTA shows  adequate pelvic vascular anatomy to allow transfemoral insertion.   The patient and her daughter were counseled at length regarding treatment alternatives for management of severe symptomatic aortic stenosis. The risks and benefits of surgical intervention has been discussed in detail. Long-term prognosis with medical therapy was discussed. Alternative approaches such as conventional surgical aortic valve replacement, transcatheter aortic valve replacement, and palliative medical therapy were compared and contrasted at length. This discussion was placed in the context of the patient's own specific clinical presentation and past medical history. All of their questions have been addressed.   Following the decision to proceed with transcatheter aortic valve replacement, a discussion was held regarding what types of management strategies would be attempted intraoperatively in the event of life-threatening complications, including whether or not the patient would be considered a candidate for the use of cardiopulmonary bypass and/or conversion to open sternotomy for attempted surgical intervention. She and her daughter stated that she does not want any blood products given but would agree to receiving albumin if needed. I do not think she would be a candidate for cardiopulmonary bypass due to her anemia and the hemodilution that occurs with cardiopulmonary bypass and the likely bleeding involved if we needed to use cardiopulmonary bypass. I told her that if we needed to do a pericardial window or sternotomy to manage pericardial bleeding then I would do that but I would not give her any blood products or put her on cardiopulmonary bypass. The patient has been advised of a variety of complications that might develop including but not limited to risks of death, stroke, paravalvular leak, aortic dissection or other major vascular complications, aortic annulus rupture, device embolization, cardiac rupture or perforation,  mitral regurgitation, acute myocardial infarction, arrhythmia, heart block or bradycardia requiring permanent pacemaker placement, congestive heart failure, respiratory failure, renal failure, pneumonia, infection, other late complications related to structural valve deterioration or migration, or other complications that might ultimately cause a temporary or permanent loss of functional independence or other long term morbidity. The patient provides full informed consent for the procedure as described and all questions were answered.   Plan:   Transfemoral transcatheter aortic valve replacement.   Gaye Pollack, MD

## 2019-11-09 NOTE — Progress Notes (Addendum)
Patient arrived to 4E room  20 at this time. Telemetry applied and CCMD notified. V/s complete. CHG bath done. Bilateral groin sites clean dry and intact. Level 0. Right radial site clean dry and intact. Level 0. Patient alert and oriented. Patient oriented to room and how to call nurse with any needs. Will continue to monitor.   Emelda Fear, RN

## 2019-11-09 NOTE — CV Procedure (Signed)
[facility]  TAVR OPERATIVE NOTE   Date of Procedure:  11/09/2019  Preoperative Diagnosis: Severe Aortic Stenosis   Postoperative Diagnosis: Same   Procedure:    Transcatheter Aortic Valve Replacement - Transfemoral Approach  Edwards Sapien 3 THV (size 26 mm, model #[id number], serial #[id number])   Co-Surgeons:  Lauree Chandler, MD and Gaye Pollack, MD   Anesthesiologist:  Roanna Banning  Echocardiographer:  Meda Coffee  Pre-operative Echo Findings:  Severe aortic stenosis  Normal left ventricular systolic function  Post-operative Echo Findings:  No paravalvular leak  Normal left ventricular systolic function  BRIEF CLINICAL NOTE AND INDICATIONS FOR SURGERY  74 yo female with history of chronic systolic CHF, HTN, hyperlipidemia, diabetes mellitus, thyroid disease and severe aortic stenosis who is here today for TAVR. She has been followed for moderate aortic stenosis. She has recently endorsed progression of dyspnea on exertion. She has had some fluid retention and has been started on Lasix. Most recent echo 08/19/19 with LVEF=60-65%. The aortic valve is thickened and calcified with limited leaflet excursion. Mean gradient 32 mmHg, peak gradient 75 mmHg, AVA 0.69 cm2, dimensionless index 0.24. Mild aortic insufficiency. Cardiac cath 10/19/19 with no evidence of CAD. Mean gradient at cath 29 mmHg. AVA 0.8 cm2.   During the course of the patient's preoperative work up they have been evaluated comprehensively by a multidisciplinary team of specialists coordinated through the [facility] in the [facility].  They have been demonstrated to suffer from symptomatic severe aortic stenosis as noted above. The patient has been counseled extensively as to the relative risks and benefits of all options for the treatment of severe aortic stenosis including long term medical therapy, conventional surgery for aortic valve replacement,  and transcatheter aortic valve replacement.  The patient has been independently evaluated by Dr. Cyndia Bent with CT surgery and they are felt to be at high risk for conventional surgical aortic valve replacement. The surgeon indicated the patient would be a poor candidate for conventional surgery. Based upon review of all of the patient's preoperative diagnostic tests they are felt to be candidate for transcatheter aortic valve replacement using the transfemoral approach as an alternative to high risk conventional surgery.    Following the decision to proceed with transcatheter aortic valve replacement, a discussion has been held regarding what types of management strategies would be attempted intraoperatively in the event of life-threatening complications, including whether or not the patient would be considered a candidate for the use of cardiopulmonary bypass and/or conversion to open sternotomy for attempted surgical intervention.  The patient has been advised of a variety of complications that might develop peculiar to this approach including but not limited to risks of death, stroke, paravalvular leak, aortic dissection or other major vascular complications, aortic annulus rupture, device embolization, cardiac rupture or perforation, acute myocardial infarction, arrhythmia, heart block or bradycardia requiring permanent pacemaker placement, congestive heart failure, respiratory failure, renal failure, pneumonia, infection, other late complications related to structural valve deterioration or migration, or other complications that might ultimately cause a temporary or permanent loss of functional independence or other long term morbidity.  The patient provides full informed consent for the procedure as described and all questions were answered preoperatively.    DETAILS OF THE OPERATIVE PROCEDURE  PREPARATION:   The patient is brought to the operating room on the above mentioned date and central monitoring  was established by the anesthesia team including placement of a radial arterial line. The patient is  placed in the supine position on the operating table.  Intravenous antibiotics are administered. Conscious sedation is used.   Baseline transthoracic echocardiogram was performed. The patient's chest, abdomen, both groins, and both lower extremities are prepared and draped in a sterile manner. A time out procedure is performed.   PERIPHERAL ACCESS:   Using the modified Seldinger technique, femoral arterial and venous access were obtained with placement of 6 Fr sheaths on the left side using u/s guidance.  A pigtail diagnostic catheter was passed through the femoral arterial sheath under fluoroscopic guidance into the aortic root.  A temporary transvenous pacemaker catheter was passed through the femoral venous sheath under fluoroscopic guidance into the right ventricle.  The pacemaker was tested to ensure stable lead placement and pacemaker capture. Aortic root angiography was performed in order to determine the optimal angiographic angle for valve deployment.  TRANSFEMORAL ACCESS:  A micropuncture kit was used to gain access to the right femoral artery with u/s guidance. Position confirmed with angiography. Pre-closure with double ProGlide closure devices. The patient was heparinized systemically and ACT verified > 250 seconds.    A 14 Fr transfemoral E-sheath was introduced into the right femoral artery after progressively dilating over an Amplatz superstiff wire. An AL-1 catheter was used to direct a straight-tip exchange length wire across the native aortic valve into the left ventricle. This was exchanged out for a pigtail catheter and position was confirmed in the LV apex. Simultaneous LV and Ao pressures were recorded.  The pigtail catheter was then exchanged for an Amplatz Extra-stiff wire in the LV apex.   TRANSCATHETER HEART VALVE DEPLOYMENT:  An Edwards Sapien 3 THV (size 26 mm) was prepared  and crimped per manufacturer's guidelines, and the proper orientation of the valve is confirmed on the [employer] delivery system. The valve was advanced through the introducer sheath using normal technique until in an appropriate position in the abdominal aorta beyond the sheath tip. The balloon was then retracted and using the fine-tuning wheel was centered on the valve. The valve was then advanced across the aortic arch using appropriate flexion of the catheter. The valve was carefully positioned across the aortic valve annulus. The Commander catheter was retracted using normal technique. Once final position of the valve has been confirmed by angiographic assessment, the valve is deployed while temporarily holding ventilation and during rapid ventricular pacing to maintain systolic blood pressure < 50 mmHg and pulse pressure < 10 mmHg. The balloon inflation is held for >3 seconds after reaching full deployment volume. Once the balloon has fully deflated the balloon is retracted into the ascending aorta and valve function is assessed using TTE. There is felt to be no paravalvular leak and no central aortic insufficiency.  The patient's hemodynamic recovery following valve deployment is good.  The deployment balloon and guidewire are both removed. Echo demostrated acceptable post-procedural gradients, stable mitral valve function, and no AI.   PROCEDURE COMPLETION:  The sheath was then removed and closure devices were completed. Protamine was administered once femoral arterial repair was complete. The temporary pacemaker, pigtail catheters and femoral sheaths were removed with a Mynx closure device placed in the left femoral artery. Manual pressure used for venous hemostasis.   The patient tolerated the procedure well and is transported to the surgical intensive care in stable condition. There were no immediate intraoperative complications. All sponge instrument and needle counts are verified correct at  completion of the operation.   No blood products were administered during the operation.  The patient received a total of 60.8 mL of intravenous contrast during the procedure.  Lauree Chandler MD 11/09/2019 4:36 PM

## 2019-11-09 NOTE — Anesthesia Procedure Notes (Signed)
Arterial Line Insertion Start/End7/10/2019 1:00 PM, 11/09/2019 1:10 PM Performed by: Murvin Natal, MD, anesthesiologist  Patient location: Pre-op. Preanesthetic checklist: patient identified, IV checked, site marked, risks and benefits discussed, surgical consent, monitors and equipment checked, pre-op evaluation, timeout performed and anesthesia consent Lidocaine 1% used for infiltration Right, radial was placed Catheter size: 20 Fr Hand hygiene performed , maximum sterile barriers used  and Seldinger technique used  Attempts: 1 Procedure performed using ultrasound guided technique. Ultrasound Notes:anatomy identified, needle tip was noted to be adjacent to the nerve/plexus identified and no ultrasound evidence of intravascular and/or intraneural injection Following insertion, dressing applied and Biopatch. Post procedure assessment: normal and unchanged  Patient tolerated the procedure well with no immediate complications.

## 2019-11-09 NOTE — Op Note (Signed)
[facility]   MULTIDISCIPLINARY HEART VALVE TEAM   TAVR OPERATIVE NOTE   Date of Procedure:  11/09/2019  Preoperative Diagnosis: Severe Aortic Stenosis   Postoperative Diagnosis: Same   Procedure:    Transcatheter Aortic Valve Replacement - Percutaneous Right Transfemoral Approach  Edwards Sapien 3 Ultra THV (size 26 mm, model # 9750TFX, serial # [id number])   Co-Surgeons:  Gaye Pollack, MD  /  Lauree Chandler, MD   Anesthesiologist:  Perfecto Kingdom, MD  Echocardiographer:  Liane Comber, MD  Pre-operative Echo Findings:  Severe aortic stenosis  Normal left ventricular systolic function  Post-operative Echo Findings:  NO paravalvular leak  Normal left ventricular systolic function   BRIEF CLINICAL NOTE AND INDICATIONS FOR SURGERY  This 74 year old [name] has stage D, severe, symptomatic aortic stenosis with New York Heart Association class II symptoms of exertional fatigue and shortness of breath consistent with chronic diastolic congestive heart failure. I have personally reviewed her 2D echocardiogram, cardiac catheterization, and CTA studies. Echocardiogram shows a trileaflet aortic valve with leaflet thickening and calcification and restricted mobility. The mean gradient is 32 mmHg with a peak velocity of 4.3 m/s. Aortic valve area of 0.85 cm consistent with severe aortic stenosis. There is mild aortic insufficiency. Left ventricular ejection fraction 60 to 65%. Cardiac catheterization shows no significant coronary disease. The mean gradient was measured at 29 mmHg with an aortic valve area of 0.8 cm consistent with severe aortic stenosis. I agree that aortic valve replacement is indicated in this patient for relief of her symptoms and to prevent progressive left ventricular deterioration. Given her age, morbid obesity, debilitation due to arthritis with restricted mobility I think transcatheter aortic valve replacement would be the best option  for her. Her gated cardiac CTA shows anatomy suitable for transcatheter aortic valve replacement using a SAPIEN 3 valve. Her abdominal and pelvic CTA shows adequate pelvic vascular anatomy to allow transfemoral insertion.   The patient and her daughter were counseled at length regarding treatment alternatives for management of severe symptomatic aortic stenosis. The risks and benefits of surgical intervention has been discussed in detail. Long-term prognosis with medical therapy was discussed. Alternative approaches such as conventional surgical aortic valve replacement, transcatheter aortic valve replacement, and palliative medical therapy were compared and contrasted at length. This discussion was placed in the context of the patient's own specific clinical presentation and past medical history. All of their questions have been addressed.   Following the decision to proceed with transcatheter aortic valve replacement, a discussion was held regarding what types of management strategies would be attempted intraoperatively in the event of life-threatening complications, including whether or not the patient would be considered a candidate for the use of cardiopulmonary bypass and/or conversion to open sternotomy for attempted surgical intervention. She and her daughter stated that she does not want any blood products given but would agree to receiving albumin if needed. I do not think she would be a candidate for cardiopulmonary bypass due to her anemia and the hemodilution that occurs with cardiopulmonary bypass and the likely bleeding involved if we needed to use cardiopulmonary bypass. I told her that if we needed to do a pericardial window or sternotomy to manage pericardial bleeding then I would do that but I would not give her any blood products or put her on cardiopulmonary bypass. The patient has been advised of a variety of complications that might develop including but not limited to risks of death,  stroke, paravalvular leak, aortic dissection  or other major vascular complications, aortic annulus rupture, device embolization, cardiac rupture or perforation, mitral regurgitation, acute myocardial infarction, arrhythmia, heart block or bradycardia requiring permanent pacemaker placement, congestive heart failure, respiratory failure, renal failure, pneumonia, infection, other late complications related to structural valve deterioration or migration, or other complications that might ultimately cause a temporary or permanent loss of functional independence or other long term morbidity. The patient provides full informed consent for the procedure as described and all questions were answered.     DETAILS OF THE OPERATIVE PROCEDURE  PREPARATION:    The patient was brought to the operating room on the above mentioned date and appropriate monitoring was established by the anesthesia team. The patient was placed in the supine position on the operating table.  Intravenous antibiotics were administered. The patient was monitored closely throughout the procedure under conscious sedation.    Baseline transthoracic echocardiogram was performed. The patient's abdomen and both groins were prepped and draped in a sterile manner. A time out procedure was performed.   PERIPHERAL ACCESS:    Using the modified Seldinger technique, femoral arterial and venous access was obtained with placement of 6 Fr sheaths on the left side.  A pigtail diagnostic catheter was passed through the left arterial sheath under fluoroscopic guidance into the aortic root.  A temporary transvenous pacemaker catheter was passed through the left femoral venous sheath under fluoroscopic guidance into the right ventricle.  The pacemaker was tested to ensure stable lead placement and pacemaker capture. Aortic root angiography was performed in order to determine the optimal angiographic angle for valve deployment.   TRANSFEMORAL ACCESS:    Percutaneous transfemoral access and sheath placement was performed using ultrasound guidance.  The right common femoral artery was cannulated using a micropuncture needle and appropriate location was verified using hand injection angiogram.  A pair of Abbott Perclose percutaneous closure devices were placed and a 6 French sheath replaced into the femoral artery.  The patient was heparinized systemically and ACT verified > 250 seconds.    A 14 Fr transfemoral E-sheath was introduced into the right common femoral artery after progressively dilating over an Amplatz superstiff wire. An AL-1 catheter was used to direct a straight-tip exchange length wire across the native aortic valve into the left ventricle. This was exchanged out for a pigtail catheter and position was confirmed in the LV apex. Simultaneous LV and Ao pressures were recorded.  The pigtail catheter was exchanged for an Amplatz Extra-stiff wire in the LV apex.     BALLOON AORTIC VALVULOPLASTY:   Not performed   TRANSCATHETER HEART VALVE DEPLOYMENT:   An Edwards Sapien 3 Ultra transcatheter heart valve (size 26 mm, model #[id number], serial #[id number]) was prepared and crimped per manufacturer's guidelines, and the proper orientation of the valve is confirmed on the [employer] delivery system. The valve was advanced through the introducer sheath using normal technique until in an appropriate position in the abdominal aorta beyond the sheath tip. The balloon was then retracted and using the fine-tuning wheel was centered on the valve. The valve was then advanced across the aortic arch using appropriate flexion of the catheter. The valve was carefully positioned across the aortic valve annulus. The Commander catheter was retracted using normal technique. Once final position of the valve has been confirmed by angiographic assessment, the valve is deployed while temporarily holding ventilation and during rapid ventricular pacing to maintain  systolic blood pressure < 50 mmHg and pulse pressure < 10 mmHg. The balloon inflation is  held for >3 seconds after reaching full deployment volume. Once the balloon has fully deflated the balloon is retracted into the ascending aorta and valve function is assessed using echocardiography. There is felt to be no paravalvular leak and no central aortic insufficiency.  The patient's hemodynamic recovery following valve deployment is good.  The deployment balloon and guidewire are both removed.    PROCEDURE COMPLETION:   The sheath was removed and femoral artery closure performed.  Protamine was administered once femoral arterial repair was complete. The temporary pacemaker, pigtail catheters and femoral sheaths were removed with manual pressure used for hemostasis.  A Mynx femoral closure device was utilized following removal of the diagnostic sheath in the left femoral artery.  The patient tolerated the procedure well and is transported to the cath lab recovery area in stable condition. There were no immediate intraoperative complications. All sponge instrument and needle counts are verified correct at completion of the operation.   No blood products were administered during the operation.  The patient received a total of 60.8 mL of intravenous contrast during the procedure.   Gaye Pollack, MD 11/09/2019

## 2019-11-09 NOTE — Progress Notes (Signed)
Patient ID: Diana Harmon, female   DOB: Mar 14, 1946, 74 y.o.   MRN: [id number]  TCTS Evening Rounds:   Hemodynamically stable  Sinus rhythm.  Groin sites ok  CBC    Component Value Date/Time   WBC 3.9 (L) 11/05/2019 1104   RBC 3.42 (L) 11/05/2019 1104   HGB 10.2 (L) 11/09/2019 1715   HGB 9.5 (L) 09/29/2019 1143   HCT 30.0 (L) 11/09/2019 1715   HCT 29.5 (L) 09/29/2019 1143   PLT 281 11/05/2019 1104   PLT 234 09/29/2019 1143   MCV 100.3 (H) 11/05/2019 1104   MCV 91 09/29/2019 1143   MCH 30.7 11/05/2019 1104   MCHC 30.6 11/05/2019 1104   RDW 14.6 11/05/2019 1104   RDW 14.0 09/29/2019 1143   LYMPHSABS 1.7 10/12/2019 1308   LYMPHSABS 1.6 09/29/2019 1143   MONOABS 0.5 10/12/2019 1308   EOSABS 0.1 10/12/2019 1308   EOSABS 0.1 09/29/2019 1143   BASOSABS 0.0 10/12/2019 1308   BASOSABS 0.0 09/29/2019 1143     BMET    Component Value Date/Time   NA 141 11/09/2019 1715   NA 138 10/28/2019 1025   K 4.4 11/09/2019 1715   CL 105 11/09/2019 1715   CO2 23 11/05/2019 1104   GLUCOSE 253 (H) 11/09/2019 1715   BUN 25 (H) 11/09/2019 1715   BUN 22 10/28/2019 1025   CREATININE 0.80 11/09/2019 1715   CREATININE 0.91 07/21/2017 0000   CALCIUM 9.7 11/05/2019 1104   GFRNONAA 52 (L) 11/05/2019 1104   GFRNONAA 63 07/21/2017 0000   GFRAA >60 11/05/2019 1104   GFRAA 74 07/21/2017 0000     A/P:  Stable postop course. Continue current plans

## 2019-11-09 NOTE — Transfer of Care (Signed)
Immediate Anesthesia Transfer of Care Note  Patient: Lurline Del Johannes  Procedure(s) Performed: TRANSCATHETER AORTIC VALVE REPLACEMENT, TRANSFEMORAL (N/A ) INTRAOPERATIVE TRANSTHORACIC ECHOCARDIOGRAM (N/A )  Patient Location: PACU and Cath Lab  Anesthesia Type:MAC  Level of Consciousness: drowsy  Airway & Oxygen Therapy: Patient Spontanous Breathing and Patient connected to face mask oxygen  Post-op Assessment: Report given to RN, Post -op Vital signs reviewed and stable and Patient moving all extremities  Post vital signs: Reviewed and stable  Last Vitals:  Vitals Value Taken Time  BP 117/56 11/09/19 1704  Temp 36.4 C 11/09/19 1700  Pulse 70 11/09/19 1708  Resp 19 11/09/19 1708  SpO2 98 % 11/09/19 1708  Vitals shown include unvalidated device data.  Last Pain:  Vitals:   11/09/19 1700  TempSrc: Temporal  PainSc: Asleep         Complications: No complications documented.

## 2019-11-10 ENCOUNTER — Inpatient Hospital Stay (HOSPITAL_COMMUNITY): Payer: Medicare Other

## 2019-11-10 ENCOUNTER — Emergency Department (HOSPITAL_COMMUNITY): Payer: Medicare Other

## 2019-11-10 ENCOUNTER — Other Ambulatory Visit: Payer: Self-pay

## 2019-11-10 ENCOUNTER — Encounter (HOSPITAL_COMMUNITY): Payer: Self-pay | Admitting: Cardiovascular Disease

## 2019-11-10 ENCOUNTER — Inpatient Hospital Stay (HOSPITAL_COMMUNITY)
Admission: EM | Admit: 2019-11-10 | Discharge: 2019-11-13 | DRG: 872 | Disposition: A | Discharge status: Home / Self Care | Payer: Medicare Other | Attending: Internal Medicine | Admitting: Internal Medicine

## 2019-11-10 DIAGNOSIS — E785 Hyperlipidemia, unspecified: Secondary | ICD-10-CM | POA: Diagnosis not present

## 2019-11-10 DIAGNOSIS — E890 Postprocedural hypothyroidism: Secondary | ICD-10-CM | POA: Diagnosis not present

## 2019-11-10 DIAGNOSIS — R6510 Systemic inflammatory response syndrome (SIRS) of non-infectious origin without acute organ dysfunction: Secondary | ICD-10-CM | POA: Diagnosis not present

## 2019-11-10 DIAGNOSIS — R21 Rash and other nonspecific skin eruption: Secondary | ICD-10-CM | POA: Diagnosis not present

## 2019-11-10 DIAGNOSIS — I110 Hypertensive heart disease with heart failure: Secondary | ICD-10-CM | POA: Diagnosis not present

## 2019-11-10 DIAGNOSIS — Z7902 Long term (current) use of antithrombotics/antiplatelets: Secondary | ICD-10-CM | POA: Diagnosis not present

## 2019-11-10 DIAGNOSIS — E1165 Type 2 diabetes mellitus with hyperglycemia: Secondary | ICD-10-CM | POA: Diagnosis not present

## 2019-11-10 DIAGNOSIS — N390 Urinary tract infection, site not specified: Secondary | ICD-10-CM | POA: Diagnosis present

## 2019-11-10 DIAGNOSIS — R509 Fever, unspecified: Secondary | ICD-10-CM | POA: Diagnosis not present

## 2019-11-10 DIAGNOSIS — Z20822 Contact with and (suspected) exposure to covid-19: Secondary | ICD-10-CM | POA: Diagnosis not present

## 2019-11-10 DIAGNOSIS — Z954 Presence of other heart-valve replacement: Secondary | ICD-10-CM

## 2019-11-10 DIAGNOSIS — Z87891 Personal history of nicotine dependence: Secondary | ICD-10-CM

## 2019-11-10 DIAGNOSIS — I10 Essential (primary) hypertension: Secondary | ICD-10-CM | POA: Diagnosis present

## 2019-11-10 DIAGNOSIS — Z79899 Other long term (current) drug therapy: Secondary | ICD-10-CM

## 2019-11-10 DIAGNOSIS — Z87440 Personal history of urinary (tract) infections: Secondary | ICD-10-CM

## 2019-11-10 DIAGNOSIS — M159 Polyosteoarthritis, unspecified: Secondary | ICD-10-CM | POA: Diagnosis present

## 2019-11-10 DIAGNOSIS — E1169 Type 2 diabetes mellitus with other specified complication: Secondary | ICD-10-CM | POA: Diagnosis not present

## 2019-11-10 DIAGNOSIS — Z8249 Family history of ischemic heart disease and other diseases of the circulatory system: Secondary | ICD-10-CM | POA: Diagnosis not present

## 2019-11-10 DIAGNOSIS — D649 Anemia, unspecified: Secondary | ICD-10-CM | POA: Diagnosis present

## 2019-11-10 DIAGNOSIS — Z9889 Other specified postprocedural states: Secondary | ICD-10-CM | POA: Diagnosis not present

## 2019-11-10 DIAGNOSIS — N179 Acute kidney failure, unspecified: Secondary | ICD-10-CM | POA: Diagnosis present

## 2019-11-10 DIAGNOSIS — Z7982 Long term (current) use of aspirin: Secondary | ICD-10-CM

## 2019-11-10 DIAGNOSIS — Z952 Presence of prosthetic heart valve: Secondary | ICD-10-CM

## 2019-11-10 DIAGNOSIS — A419 Sepsis, unspecified organism: Principal | ICD-10-CM | POA: Diagnosis present

## 2019-11-10 DIAGNOSIS — I5042 Chronic combined systolic (congestive) and diastolic (congestive) heart failure: Secondary | ICD-10-CM | POA: Diagnosis not present

## 2019-11-10 DIAGNOSIS — J9811 Atelectasis: Secondary | ICD-10-CM | POA: Diagnosis not present

## 2019-11-10 DIAGNOSIS — Z880 Allergy status to penicillin: Secondary | ICD-10-CM

## 2019-11-10 DIAGNOSIS — J984 Other disorders of lung: Secondary | ICD-10-CM | POA: Diagnosis not present

## 2019-11-10 DIAGNOSIS — Z794 Long term (current) use of insulin: Secondary | ICD-10-CM | POA: Diagnosis not present

## 2019-11-10 DIAGNOSIS — I700 Atherosclerosis of aorta: Secondary | ICD-10-CM | POA: Diagnosis not present

## 2019-11-10 DIAGNOSIS — R109 Unspecified abdominal pain: Secondary | ICD-10-CM | POA: Diagnosis not present

## 2019-11-10 DIAGNOSIS — Z953 Presence of xenogenic heart valve: Secondary | ICD-10-CM | POA: Diagnosis not present

## 2019-11-10 DIAGNOSIS — Z888 Allergy status to other drugs, medicaments and biological substances status: Secondary | ICD-10-CM

## 2019-11-10 DIAGNOSIS — R000 Tachycardia, unspecified: Secondary | ICD-10-CM | POA: Diagnosis not present

## 2019-11-10 DIAGNOSIS — I350 Nonrheumatic aortic (valve) stenosis: Secondary | ICD-10-CM

## 2019-11-10 DIAGNOSIS — Z6841 Body Mass Index (BMI) 40.0 and over, adult: Secondary | ICD-10-CM

## 2019-11-10 DIAGNOSIS — I5032 Chronic diastolic (congestive) heart failure: Secondary | ICD-10-CM | POA: Diagnosis present

## 2019-11-10 LAB — CBC WITH DIFFERENTIAL/PLATELET
Abs Immature Granulocytes: 0.03 10*3/uL (ref 0.00–0.07)
Basophils Absolute: 0 10*3/uL (ref 0.0–0.1)
Basophils Relative: 0 %
Eosinophils Absolute: 0 10*3/uL (ref 0.0–0.5)
Eosinophils Relative: 0 %
HCT: 32.2 % — ABNORMAL LOW (ref 36.0–46.0)
Hemoglobin: 10.1 g/dL — ABNORMAL LOW (ref 12.0–15.0)
Immature Granulocytes: 1 %
Lymphocytes Relative: 11 %
Lymphs Abs: 0.7 10*3/uL (ref 0.7–4.0)
MCH: 30.3 pg (ref 26.0–34.0)
MCHC: 31.4 g/dL (ref 30.0–36.0)
MCV: 96.7 fL (ref 80.0–100.0)
Monocytes Absolute: 0.4 10*3/uL (ref 0.1–1.0)
Monocytes Relative: 6 %
Neutro Abs: 5.2 10*3/uL (ref 1.7–7.7)
Neutrophils Relative %: 82 %
Platelets: 216 10*3/uL (ref 150–400)
RBC: 3.33 MIL/uL — ABNORMAL LOW (ref 3.87–5.11)
RDW: 14.5 % (ref 11.5–15.5)
WBC: 6.3 10*3/uL (ref 4.0–10.5)
nRBC: 0 % (ref 0.0–0.2)

## 2019-11-10 LAB — COMPREHENSIVE METABOLIC PANEL
ALT: 33 U/L (ref 0–44)
AST: 45 U/L — ABNORMAL HIGH (ref 15–41)
Albumin: 3.1 g/dL — ABNORMAL LOW (ref 3.5–5.0)
Alkaline Phosphatase: 54 U/L (ref 38–126)
Anion gap: 12 (ref 5–15)
BUN: 24 mg/dL — ABNORMAL HIGH (ref 8–23)
CO2: 20 mmol/L — ABNORMAL LOW (ref 22–32)
Calcium: 9.3 mg/dL (ref 8.9–10.3)
Chloride: 103 mmol/L (ref 98–111)
Creatinine, Ser: 1.2 mg/dL — ABNORMAL HIGH (ref 0.44–1.00)
GFR calc Af Amer: 52 mL/min — ABNORMAL LOW (ref >60)
GFR calc non Af Amer: 45 mL/min — ABNORMAL LOW (ref >60)
Glucose, Bld: 240 mg/dL — ABNORMAL HIGH (ref 70–99)
Potassium: 4 mmol/L (ref 3.5–5.1)
Sodium: 135 mmol/L (ref 135–145)
Total Bilirubin: 0.5 mg/dL (ref 0.3–1.2)
Total Protein: 7.2 g/dL (ref 6.5–8.1)

## 2019-11-10 LAB — URINALYSIS, ROUTINE W REFLEX MICROSCOPIC
Bacteria, UA: NONE SEEN
Bilirubin Urine: NEGATIVE
Glucose, UA: NEGATIVE mg/dL
Ketones, ur: NEGATIVE mg/dL
Leukocytes,Ua: NEGATIVE
Nitrite: NEGATIVE
Protein, ur: NEGATIVE mg/dL
Specific Gravity, Urine: 1.02 (ref 1.005–1.030)
pH: 5 (ref 5.0–8.0)

## 2019-11-10 LAB — GLUCOSE, CAPILLARY
Glucose-Capillary: 161 mg/dL — ABNORMAL HIGH (ref 70–99)
Glucose-Capillary: 217 mg/dL — ABNORMAL HIGH (ref 70–99)

## 2019-11-10 LAB — BASIC METABOLIC PANEL
Anion gap: 10 (ref 5–15)
BUN: 26 mg/dL — ABNORMAL HIGH (ref 8–23)
CO2: 23 mmol/L (ref 22–32)
Calcium: 9.2 mg/dL (ref 8.9–10.3)
Chloride: 105 mmol/L (ref 98–111)
Creatinine, Ser: 1.03 mg/dL — ABNORMAL HIGH (ref 0.44–1.00)
GFR calc Af Amer: >60 mL/min (ref >60)
GFR calc non Af Amer: 54 mL/min — ABNORMAL LOW (ref >60)
Glucose, Bld: 182 mg/dL — ABNORMAL HIGH (ref 70–99)
Potassium: 4.5 mmol/L (ref 3.5–5.1)
Sodium: 138 mmol/L (ref 135–145)

## 2019-11-10 LAB — CBC
HCT: 30.9 % — ABNORMAL LOW (ref 36.0–46.0)
Hemoglobin: 9.4 g/dL — ABNORMAL LOW (ref 12.0–15.0)
MCH: 30.1 pg (ref 26.0–34.0)
MCHC: 30.4 g/dL (ref 30.0–36.0)
MCV: 99 fL (ref 80.0–100.0)
Platelets: 200 10*3/uL (ref 150–400)
RBC: 3.12 MIL/uL — ABNORMAL LOW (ref 3.87–5.11)
RDW: 14.6 % (ref 11.5–15.5)
WBC: 4.2 10*3/uL (ref 4.0–10.5)
nRBC: 0 % (ref 0.0–0.2)

## 2019-11-10 LAB — APTT: aPTT: 30 seconds (ref 24–36)

## 2019-11-10 LAB — PROTIME-INR
INR: 1.1 (ref 0.8–1.2)
Prothrombin Time: 13.7 seconds (ref 11.4–15.2)

## 2019-11-10 LAB — ECHOCARDIOGRAM COMPLETE
Height: 60 in
Weight: 3714.31 oz

## 2019-11-10 LAB — MAGNESIUM: Magnesium: 1.9 mg/dL (ref 1.7–2.4)

## 2019-11-10 LAB — LACTIC ACID, PLASMA: Lactic Acid, Venous: 1.8 mmol/L (ref 0.5–1.9)

## 2019-11-10 MED ORDER — ASPIRIN 81 MG PO CHEW: 81.0000 mg | CHEWABLE_TABLET | Freq: Every day | ORAL | Status: AC

## 2019-11-10 MED ORDER — SODIUM CHLORIDE 0.9 % IV SOLN
2.0000 g | Freq: Two times a day (BID) | INTRAVENOUS | Status: DC
  Administered 2019-11-10 – 2019-11-12 (×5): 2 g via INTRAVENOUS
  Filled 2019-11-10 (×9): qty 2

## 2019-11-10 MED ORDER — CLOPIDOGREL BISULFATE 75 MG PO TABS: 75.0000 mg | ORAL_TABLET | Freq: Every day | ORAL | 1 refills | Status: DC

## 2019-11-10 MED ORDER — METRONIDAZOLE IN NACL 5-0.79 MG/ML-% IV SOLN
500.0000 mg | Freq: Once | INTRAVENOUS | Status: AC
  Administered 2019-11-10: 500 mg via INTRAVENOUS
  Filled 2019-11-10: qty 100

## 2019-11-10 MED ORDER — FENTANYL CITRATE (PF) 100 MCG/2ML IJ SOLN
50.0000 ug | Freq: Once | INTRAMUSCULAR | Status: AC
  Administered 2019-11-11: 50 ug via INTRAVENOUS
  Filled 2019-11-10: qty 2

## 2019-11-10 MED ORDER — VANCOMYCIN HCL 1250 MG/250ML IV SOLN
1250.0000 mg | INTRAVENOUS | Status: DC
  Administered 2019-11-11 – 2019-11-12 (×2): 1250 mg via INTRAVENOUS
  Filled 2019-11-10 (×2): qty 250

## 2019-11-10 MED ORDER — ACETAMINOPHEN 325 MG PO TABS
650.0000 mg | ORAL_TABLET | Freq: Once | ORAL | Status: AC
  Administered 2019-11-10: 650 mg via ORAL
  Filled 2019-11-10 (×2): qty 2

## 2019-11-10 NOTE — Progress Notes (Signed)
Discharge instructions reviewed with patient and patient's daughter, [name]. All questions answered.

## 2019-11-10 NOTE — Anesthesia Postprocedure Evaluation (Signed)
Anesthesia Post Note  Patient: Diana Harmon  Procedure(s) Performed: TRANSCATHETER AORTIC VALVE REPLACEMENT, TRANSFEMORAL (N/A ) INTRAOPERATIVE TRANSTHORACIC ECHOCARDIOGRAM (N/A )     Patient location during evaluation: PACU Anesthesia Type: MAC Level of consciousness: awake and alert Pain management: pain level controlled Vital Signs Assessment: post-procedure vital signs reviewed and stable Respiratory status: spontaneous breathing, nonlabored ventilation and respiratory function stable Cardiovascular status: stable and blood pressure returned to baseline Anesthetic complications: no   No complications documented.  Last Vitals:  Vitals:   11/10/19 0400 11/10/19 0900  BP: (!) 147/64 (!) 126/51  Pulse: 74 88  Resp: 18 19  Temp: 36.7 C 37 C  SpO2: 100% 100%    Last Pain:  Vitals:   11/10/19 0900  TempSrc: Oral  PainSc:                  Audry Pili

## 2019-11-10 NOTE — ED Notes (Signed)
Pt transported to CT ?

## 2019-11-10 NOTE — Progress Notes (Signed)
CARDIAC REHAB PHASE I   PRE:  Rate/Rhythm: 91 SR  BP:  Sitting: 139/63      SaO2: 99 RA  MODE:  Ambulation: 350 ft   POST:  Rate/Rhythm: 140 ST  BP:  Sitting: 157/81    SaO2: 99 RA  Pt ambulated 342ft in hallway standby assist with front wheel walker. Pt took several short standing rest breaks c/o knee pain and SOB. Pt states breathing in much improved since procedure though. Pt educated on importance of site care and monitoring incisions daily. Encouraged continued ambulation. Reviewed restrictions and exercise guidelines. Will refer to CRP II [place]. Hopeful for d/c today.  [phone] Rufina Falco, RN BSN 11/10/2019 10:26 AM

## 2019-11-10 NOTE — ED Provider Notes (Signed)
[facility] EMERGENCY DEPARTMENT Provider Note   CSN: [id number] Arrival date & time: 11/10/19  2123     History Chief Complaint  Patient presents with  . Fever    Diana Harmon is a 74 y.o. female with a history of severe aortic stenosis S/p TAVR 11/09/19, T2DM, anemia, recurrent UTI, hypertension, HFpEF, & thyroid disease who presents to the ED for evaluation of fever that began this evening. Patient states she had her TAVR procedure performed by Dr. Cyndia Bent & Dr. Angelena Form yesterday and was subsequently discharged home from the hospital around 3PM this afternoon. She had been having a mild amount of RLQ discomfort this morning but this seemed to worsen after waking up from a nap that she took once she arrived home with associated chills and fever to 102. Pain is a 7/10 in severity, does not seem to have specific alleviating/aggravating factors. She also notes some urinary frequency as well as some mild nausea on further questioning. Given her sxs her daughter brought her to the hospital, she states she has not given the patient any medications PTA. Patient denies URI sxs, neck stiffness, chest pain, dyspnea, vomiting, diarrhea, melena, hematochezia, dysuria, vaginal bleeding, or rashes.   HPI     Past Medical History:  Diagnosis Date  . Anemia   . Arthritis    legs, knees, back, wrists  . Diabetes mellitus without complication ([place])   . Hypertension   . Nausea   . Patient is [name]   . S/P TAVR (transcatheter aortic valve replacement) 11/09/2019   s/p TAVR with a 26 mm Edwards S3U via the TF approach by Dr. Angelena Form & Cyndia Bent  . Severe aortic stenosis   . Thyroid disease     Patient Active Problem List   Diagnosis Date Noted  . S/P TAVR (transcatheter aortic valve replacement) 11/09/2019  . Patient is [name]   . Severe aortic stenosis   . Thyroid disease   . Hypertension   . Chronic heart failure with preserved ejection  fraction (HFpEF) ([place]) 09/30/2019  . Normocytic anemia 02/17/2019  . Morbid obesity ([place]) 11/05/2017  . Neutropenia ([place]) 04/24/2017  . Vitamin D deficiency 10/08/2016  . Meniere's disease of right ear 07/04/2016  . TIA (transient ischemic attack) 07/04/2016  . Nonrheumatic aortic valve stenosis 07/04/2016  . Seasonal allergies 01/30/2016  . Recurrent UTI 10/26/2015  . Iron deficiency anemia due to chronic blood loss 02/28/2015  . History of adenomatous polyp of colon 02/13/2015  . Osteoarthritis of both knees 12/05/2014  . Acid reflux 12/05/2014  . Benign neoplasm of colon 12/05/2014  . Type 2 diabetes mellitus with other specified complication ([place]) 37/02/6268  . Essential hypertension 12/05/2014  . Arthritis, degenerative 12/05/2014  . Anemia due to multiple mechanisms 12/05/2014  . Abnormal liver enzymes 12/05/2014    Past Surgical History:  Procedure Laterality Date  . CARDIAC CATHETERIZATION  10/19/2019  . COLON SURGERY     caudarized polyps; rectal carcinoid excision ~ 2011  . COLONOSCOPY    . COLONOSCOPY WITH PROPOFOL N/A 03/13/2015   Procedure: COLONOSCOPY WITH PROPOFOL;  Surgeon: Manya Silvas, MD;  Location: [facility] ENDOSCOPY;  Service: Endoscopy;  Laterality: N/A;  . ESOPHAGOGASTRODUODENOSCOPY N/A 03/13/2015   Procedure: ESOPHAGOGASTRODUODENOSCOPY (EGD);  Surgeon: Manya Silvas, MD;  Location: [facility] ENDOSCOPY;  Service: Endoscopy;  Laterality: N/A;  . INTRAOPERATIVE TRANSTHORACIC ECHOCARDIOGRAM N/A 11/09/2019   Procedure: INTRAOPERATIVE TRANSTHORACIC ECHOCARDIOGRAM;  Surgeon: Burnell Blanks, MD;  Location: [place];  Service: Open Heart  Surgery;  Laterality: N/A;  . PARATHYROIDECTOMY  11/20/2004   right superior parathyroidecomy for primary hyperparathyroidism/parathyroid adenoma  . RIGHT/LEFT HEART CATH AND CORONARY ANGIOGRAPHY N/A 10/19/2019   Procedure: RIGHT/LEFT HEART CATH AND CORONARY ANGIOGRAPHY;  Surgeon: Nelva Bush, MD;  Location: [place] [facility]  LAB;  Service: Cardiovascular;  Laterality: N/A;  . THYROIDECTOMY    . TRANSCATHETER AORTIC VALVE REPLACEMENT, TRANSFEMORAL N/A 11/09/2019   Procedure: TRANSCATHETER AORTIC VALVE REPLACEMENT, TRANSFEMORAL;  Surgeon: Burnell Blanks, MD;  Location: [place];  Service: Open Heart Surgery;  Laterality: N/A;     OB History   No obstetric history on file.     Family History  Problem Relation Age of Onset  . Parkinson's disease Mother   . Heart disease Father   . Lupus Sister   . Aneurysm Son   . Lupus Son   . Breast cancer Maternal Aunt        mat great aunt    Social History   Tobacco Use  . Smoking status: Former Smoker    Years: 20.00    Types: Cigarettes    Quit date: 09/05/1979    Years since quitting: 40.2  . Smokeless tobacco: Former Network engineer  . Vaping Use: Never used  Substance Use Topics  . Alcohol use: Yes    Alcohol/week: 0.0 - 1.0 standard drinks    Comment: occ. wine  . Drug use: No    Home Medications Prior to Admission medications   Medication Sig Start Date End Date Taking? Authorizing Provider  acetaminophen (TYLENOL) 500 MG tablet Take 1,000 mg by mouth every 6 (six) hours as needed for moderate pain.     [provider]  Artificial Tear Solution (GENTEAL TEARS) 0.1-0.2-0.3 % SOLN Place 1 drop into both eyes 3 (three) times daily as needed (dry/irritated eyes.).    [provider]  aspirin 81 MG chewable tablet Chew 1 tablet (81 mg total) by mouth daily. 11/11/19   Eileen Stanford, PA-C  B COMPLEX-C PO Take 10 mLs by mouth daily.    [provider]  baclofen (LIORESAL) 10 MG tablet TAKE 1 TO 2 TABLETS BY MOUTH TWICE DAILY AS NEEDED FOR MUSCLE SPASM Patient taking differently: Take 10-20 mg by mouth 2 (two) times daily as needed for muscle spasms.  07/07/19   [name], Devonne Doughty, DO  Blood Glucose Monitoring Suppl (ACCU-CHEK NANO SMARTVIEW) w/Device KIT Use to check blood glucose up to once daily as instructed  08/28/16   [name], DO  Cholecalciferol (VITAMIN D3) 125 MCG (5000 UT) TABS Take 5,000 Units by mouth daily.    [provider]  clopidogrel (PLAVIX) 75 MG tablet Take 1 tablet (75 mg total) by mouth daily with breakfast. 11/11/19   Eileen Stanford, PA-C  Ferrous Sulfate (IRON) 325 (65 Fe) MG TABS TAKE 1 TABLET BY MOUTH TWICE DAILY WITH A MEAL Patient taking differently: Take 325 mg by mouth in the morning and at bedtime.  06/05/18   [name], Devonne Doughty, DO  furosemide (LASIX) 20 MG tablet Take 1 tablet (20 mg total) by mouth daily as needed for edema. Patient taking differently: Take 20 mg by mouth daily as needed for edema (fluid retention).  10/01/19 12/30/19  End, Harrell Gave, MD  gabapentin (NEURONTIN) 100 MG capsule Start 1 capsule daily, increase by 1 cap every 2-3 days as tolerated up to 3 times a day, or may take 3 at once in evening. Patient taking differently: Take 100-300 mg by  mouth 3 (three) times daily as needed (shingles pain.).  09/14/19   [name], Devonne Doughty, DO  glucose blood (ACCU-CHEK SMARTVIEW) test strip Check blood glucose up to once daily as instructed 08/28/16   [name], DO  hydrochlorothiazide (HYDRODIURIL) 25 MG tablet Take 1 tablet by mouth once daily Patient taking differently: Take 25 mg by mouth daily.  11/16/18   End, Harrell Gave, MD  lactobacillus acidophilus (BACID) TABS tablet Take 2 tablets by mouth 2 (two) times daily as needed (digestive health.).     [provider]  levocetirizine (XYZAL) 5 MG tablet Take 1 tablet (5 mg total) by mouth every evening. Patient taking differently: Take 5 mg by mouth daily as needed (allergies.).  04/24/17   [name], DO  losartan (COZAAR) 50 MG tablet Take 1 tablet by mouth once daily 11/07/19   Parks Ranger, Devonne Doughty, DO  metFORMIN (GLUCOPHAGE) 500 MG tablet Take 1 tablet (500 mg total) by mouth daily with supper. 10/22/19   End, Harrell Gave, MD  mupirocin  ointment (BACTROBAN) 2 % Apply 1 application topically 2 (two) times daily. 09/05/19   Melynda Ripple, MD  simvastatin (ZOCOR) 20 MG tablet TAKE 1 TABLET BY MOUTH ONCE DAILY AT 6 PM Patient taking differently: Take 20 mg by mouth every evening.  09/17/19   [name], Devonne Doughty, DO  Cromolyn Sodium (NASAL ALLERGY NA) Place 1 spray into the nose daily as needed (allergies).  09/05/19  [provider]    Allergies    Ferumoxytol, Other, and Penicillins  Review of Systems   Review of Systems  Constitutional: Positive for chills, fatigue and fever.  Respiratory: Negative for cough and shortness of breath.   Cardiovascular: Negative for chest pain.  Gastrointestinal: Positive for abdominal pain and nausea. Negative for anal bleeding, blood in stool, constipation, diarrhea and vomiting.  Genitourinary: Positive for frequency. Negative for dysuria, hematuria, vaginal bleeding and vaginal discharge.  Skin: Negative for rash.  Neurological: Negative for syncope.  All other systems reviewed and are negative.   Physical Exam Updated Vital Signs BP (!) 180/80   Pulse (!) 130   Temp (!) 102.4 F (39.1 C) (Oral)   Resp (!) 22   SpO2 100%   Physical Exam Vitals and nursing note reviewed.  Constitutional:      General: She is not in acute distress.    Appearance: She is well-developed. She is not toxic-appearing.  HENT:     Head: Normocephalic and atraumatic.  Eyes:     General:        Right eye: No discharge.        Left eye: No discharge.     Conjunctiva/sclera: Conjunctivae normal.     Pupils: Pupils are equal, round, and reactive to light.  Cardiovascular:     Rate and Rhythm: Regular rhythm. Tachycardia present.  Pulmonary:     Effort: Pulmonary effort is normal. No respiratory distress.     Breath sounds: Normal breath sounds. No wheezing, rhonchi or rales.  Abdominal:     General: There is no distension.     Palpations: Abdomen is soft.     Tenderness: There is  abdominal tenderness (RLQ). There is no right CVA tenderness, left CVA tenderness, guarding or rebound.  Genitourinary:    Comments: Small area of erythema without significant skin breakdown to the gluteal fold present. No purulence, significant induration or fluctuance.  Surgical sites to bilateral groin are without erythema, purulent drainage, active bleeding, or ecchymosis.  Musculoskeletal:  Cervical back: Normal range of motion and neck supple. No rigidity.  Skin:    General: Skin is warm and dry.     Findings: No rash.  Neurological:     General: No focal deficit present.     Mental Status: She is alert.     Comments: Clear speech.   Psychiatric:        Behavior: Behavior normal.     ED Results / Procedures / Treatments   Labs (all labs ordered are listed, but only abnormal results are displayed) Labs Reviewed  CBC WITH DIFFERENTIAL/PLATELET - Abnormal; Notable for the following components:      Result Value   RBC 3.33 (*)    Hemoglobin 10.1 (*)    HCT 32.2 (*)    All other components within normal limits  CULTURE, BLOOD (ROUTINE X 2)  CULTURE, BLOOD (ROUTINE X 2)  PROTIME-INR  COMPREHENSIVE METABOLIC PANEL  LACTIC ACID, PLASMA  LACTIC ACID, PLASMA  URINALYSIS, ROUTINE W REFLEX MICROSCOPIC    EKG None  Radiology DG Chest 2 View  Result Date: 11/10/2019 CLINICAL DATA:  74 year old female with sepsis. EXAM: CHEST - 2 VIEW COMPARISON:  Chest radiograph dated 11/09/2019. FINDINGS: Minimal left lung base atelectasis. Developing infiltrate is less likely. Clinical correlation is recommended. No focal consolidation, pleural effusion, or pneumothorax. The cardiac silhouette is within limits. Aortic valve repair. No acute osseous pathology. IMPRESSION: Minimal left lung base atelectasis. No focal consolidation. Electronically Signed   By: Anner Crete M.D.   On: 11/10/2019 21:48   DG Chest Port 1 View  Result Date: 11/09/2019 CLINICAL DATA:  74 year old female with  TAVR. EXAM: PORTABLE CHEST 1 VIEW COMPARISON:  Chest radiograph dated 11/05/2019. FINDINGS: Bibasilar minimal atelectasis. No focal consolidation, pleural effusion, or pneumothorax. Mild cardiomegaly. Aortic valve repair. No acute osseous pathology. IMPRESSION: Status post TAVR.  No acute findings. Electronically Signed   By: Anner Crete M.D.   On: 11/09/2019 19:34   ECHOCARDIOGRAM COMPLETE  Result Date: 11/10/2019    ECHOCARDIOGRAM REPORT   Patient Name:   Diana Harmon Date of Exam: 11/10/2019 Medical Rec #:  [id number]               Height:       60.0 in Accession #:    [id number]              Weight:       232.1 lb Date of Birth:  [birth year]               BSA:          1.989 m Patient Age:    71 years                BP:           126/51 mmHg Patient Gender: F                       HR:           89 bpm. Exam Location:  Inpatient Procedure: 2D Echo Indications:    aortic valve replacement  History:        Patient has prior history of Echocardiogram examinations, most                 recent 11/09/2019. Risk Factors:Hypertension and Diabetes.  Sonographer:    Johny Chess Referring Phys: [id number] Benson R THOMPSON  Sonographer Comments: Patient is morbidly obese. IMPRESSIONS  1. Day 1 post TAVR,  no paravalvular leak. Upper normal transaortic gradients with peak/mean 37/18 mmHg.  2. Left ventricular ejection fraction, by estimation, is 65 to 70%. The left ventricle has normal function. The left ventricle has no regional wall motion abnormalities. Left ventricular diastolic parameters are consistent with Grade I diastolic dysfunction (impaired relaxation).  3. Right ventricular systolic function is normal. The right ventricular size is normal. There is normal pulmonary artery systolic pressure. The estimated right ventricular systolic pressure is 43.3 mmHg.  4. The mitral valve is normal in structure. No evidence of mitral valve regurgitation. No evidence of mitral stenosis.  5. The aortic valve has  been repaired/replaced. Aortic valve regurgitation is not visualized. No aortic stenosis is present. Procedure Date: 11/09/2019. Echo findings are consistent with normal structure and function of the aortic valve prosthesis. Aortic  valve mean gradient measures 18.0 mmHg.  6. The inferior vena cava is normal in size with greater than 50% respiratory variability, suggesting right atrial pressure of 3 mmHg. FINDINGS  Left Ventricle: Left ventricular ejection fraction, by estimation, is 65 to 70%. The left ventricle has normal function. The left ventricle has no regional wall motion abnormalities. The left ventricular internal cavity size was normal in size. There is  no left ventricular hypertrophy. Left ventricular diastolic parameters are consistent with Grade I diastolic dysfunction (impaired relaxation). Normal left ventricular filling pressure. Right Ventricle: The right ventricular size is normal. No increase in right ventricular wall thickness. Right ventricular systolic function is normal. There is normal pulmonary artery systolic pressure. The tricuspid regurgitant velocity is 2.51 m/s, and  with an assumed right atrial pressure of 3 mmHg, the estimated right ventricular systolic pressure is 29.5 mmHg. Left Atrium: Left atrial size was normal in size. Right Atrium: Right atrial size was normal in size. Pericardium: There is no evidence of pericardial effusion. Mitral Valve: The mitral valve is normal in structure. Normal mobility of the mitral valve leaflets. No evidence of mitral valve regurgitation. No evidence of mitral valve stenosis. Tricuspid Valve: The tricuspid valve is normal in structure. Tricuspid valve regurgitation is mild . No evidence of tricuspid stenosis. Aortic Valve: The aortic valve has been repaired/replaced. Aortic valve regurgitation is not visualized. No aortic stenosis is present. Aortic valve mean gradient measures 18.0 mmHg. Aortic valve peak gradient measures 37.5 mmHg. Aortic valve  area, by VTI measures 2.29 cm. There is a 26 mm Ultra, stented (TAVR) valve present in the aortic position. Echo findings are consistent with normal structure and function of the aortic valve prosthesis. Pulmonic Valve: The pulmonic valve was normal in structure. Pulmonic valve regurgitation is not visualized. No evidence of pulmonic stenosis. Aorta: The aortic root is normal in size and structure. Venous: The inferior vena cava is normal in size with greater than 50% respiratory variability, suggesting right atrial pressure of 3 mmHg. IAS/Shunts: No atrial level shunt detected by color flow Doppler.  LEFT VENTRICLE PLAX 2D LVIDd:         4.70 cm  Diastology LVIDs:         2.80 cm  LV e' lateral:   8.49 cm/s LV PW:         1.40 cm  LV E/e' lateral: 11.8 LV IVS:        1.10 cm  LV e' medial:    8.92 cm/s LVOT diam:     2.00 cm  LV E/e' medial:  11.2 LV SV:         100 LV SV Index:   50 LVOT  Area:     3.14 cm  RIGHT VENTRICLE            IVC RV S prime:     9.68 cm/s  IVC diam: 1.50 cm TAPSE (M-mode): 1.2 cm LEFT ATRIUM             Index       RIGHT ATRIUM          Index LA diam:        3.30 cm 1.66 cm/m  RA Area:     8.50 cm LA Vol (A2C):   38.1 ml 19.16 ml/m RA Volume:   15.10 ml 7.59 ml/m LA Vol (A4C):   33.7 ml 16.95 ml/m LA Biplane Vol: 35.9 ml 18.05 ml/m  AORTIC VALVE AV Area (Vmax):    1.86 cm AV Area (Vmean):   1.85 cm AV Area (VTI):     2.29 cm AV Vmax:           306.00 cm/s AV Vmean:          194.000 cm/s AV VTI:            0.436 m AV Peak Grad:      37.5 mmHg AV Mean Grad:      18.0 mmHg LVOT Vmax:         181.00 cm/s LVOT Vmean:        114.500 cm/s LVOT VTI:          0.318 m LVOT/AV VTI ratio: 0.73  AORTA Ao Root diam: 3.30 cm Ao Asc diam:  3.10 cm MITRAL VALVE                TRICUSPID VALVE MV Area (PHT): 3.46 cm     TR Peak grad:   25.2 mmHg MV Decel Time: 219 msec     TR Vmax:        251.00 cm/s MV E velocity: 100.00 cm/s MV A velocity: 132.00 cm/s  SHUNTS MV E/A ratio:  0.76         Systemic  VTI:  0.32 m                             Systemic Diam: 2.00 cm Ena Dawley MD Electronically signed by Ena Dawley MD Signature Date/Time: 11/10/2019/10:28:19 PM    Final    CT Angio Chest/Abd/Pel for Dissection W and/or W/WO  Result Date: 11/11/2019 CLINICAL DATA:  Status post TAVR yesterday with persistent right-sided pain and fevers EXAM: CT ANGIOGRAPHY CHEST, ABDOMEN AND PELVIS TECHNIQUE: Non-contrast CT of the chest was initially obtained. Multidetector CT imaging through the chest, abdomen and pelvis was performed using the standard protocol during bolus administration of intravenous contrast. Multiplanar reconstructed images and MIPs were obtained and reviewed to evaluate the vascular anatomy. CONTRAST:  127m OMNIPAQUE IOHEXOL 350 MG/ML SOLN COMPARISON:  Prior chest x-ray from the previous day, CT from 11/02/2019. FINDINGS: CTA CHEST FINDINGS Cardiovascular: Initial precontrast scan demonstrates changes consistent with the recent TAVR. No hyperdense crescent to suggest aortic dissection or acute aortic injury is seen. Atherosclerotic calcifications of the aorta are noted. No aneurysmal dilatation or focal dissection is noted following contrast administration. The pulmonary artery as visualized shows no large central embolus. No significant coronary calcifications are noted. No cardiac enlargement is seen. Mediastinum/Nodes: Thoracic inlet is within normal limits. No sizable hilar or mediastinal adenopathy is noted. The esophagus is within normal limits. Lungs/Pleura: The lungs are well aerated bilaterally. No  focal infiltrate or sizable effusion is noted. No sizable parenchymal nodules are seen. Mild subpleural scarring is noted in the left lower lobe. Musculoskeletal: Degenerative changes of the thoracic spine are noted. No acute rib abnormality is seen. Review of the MIP images confirms the above findings. CTA ABDOMEN AND PELVIS FINDINGS VASCULAR Aorta: Atherosclerotic calcifications are noted.  No aneurysmal dilatation or dissection is seen. Celiac: Patent without evidence of aneurysm, dissection, vasculitis or significant stenosis. SMA: Patent without evidence of aneurysm, dissection, vasculitis or significant stenosis. Renals: Single renal arteries are noted bilaterally. No focal stenosis is seen. Mild atherosclerotic calcifications are noted. IMA: Patent without evidence of aneurysm, dissection, vasculitis or significant stenosis. Iliacs: Mild atherosclerotic calcifications are noted without aneurysmal dilatation. Postsurgical changes are seen in the inguinal regions bilaterally. Veins: No specific venous abnormality is noted. Review of the MIP images confirms the above findings. NON-VASCULAR Hepatobiliary: Liver is fatty infiltrated. Gallbladder is within normal limits. Pancreas: Unremarkable. No pancreatic ductal dilatation or surrounding inflammatory changes. Spleen: Normal in size without focal abnormality. Adrenals/Urinary Tract: Adrenal glands are within normal limits. Kidneys demonstrate a normal enhancement pattern. No abnormal mass lesion is seen. The bladder is decompressed. Stomach/Bowel: The appendix is within normal limits. No obstructive or inflammatory changes of the colon are seen. The small bowel is within normal limits. Stomach is unremarkable. Lymphatic: No significant lymphadenopathy is noted. Reproductive: Uterus and bilateral adnexa are unremarkable. Other: No abdominal wall hernia or abnormality. No abdominopelvic ascites. Musculoskeletal: No acute or significant osseous findings. Review of the MIP images confirms the above findings. IMPRESSION: Changes consistent with prior TAVR and bilateral femoral arterial access. No evidence of aortic dissection or aneurysmal dilatation is seen. No retroperitoneal hematoma is noted. Mild subpleural scarring in the left base. No acute abnormality in the abdomen and pelvis is seen. Electronically Signed   By: Inez Catalina M.D.   On: 11/11/2019  00:43   ECHOCARDIOGRAM LIMITED  Result Date: 11/09/2019    ECHOCARDIOGRAM LIMITED REPORT   Patient Name:   Diana Harmon Date of Exam: 11/09/2019 Medical Rec #:  [id number]               Height:       60.0 in Accession #:    [id number]              Weight:       235.5 lb Date of Birth:  09/22/1945               BSA:          2.001 m Patient Age:    80 years                BP:           170/65 mmHg Patient Gender: F                       HR:           104 bpm. Exam Location:  Inpatient Procedure: Limited Echo, Color Doppler and Cardiac Doppler Indications:     Aortic Stenosis i35.0  History:         Patient has prior history of Echocardiogram examinations, most                  recent 08/20/2019. CHF; Risk Factors:Hypertension, Diabetes and                  Dyslipidemia.  Sonographer:     Raquel Sarna  Senior RDCS Referring Phys:  [place] Diagnosing Phys: Ena Dawley MD IMPRESSIONS  1. Limited periprocedural TTE during a TAVR procedure. A 26 mm Edwards-SAPIEN 3 Ultra valve was successfully deployed in the aortic position with improvement of peak/mean transaortic gradients 49/29 mmHg to 6/3 mmHg. There was no paravalvular leak. No pericardial effusion was seen prior and post procedure.  2. Left ventricular ejection fraction, by estimation, is 65 to 70%. The left ventricle has normal function. The left ventricle has no regional wall motion abnormalities. There is mild concentric left ventricular hypertrophy. Left ventricular diastolic function could not be evaluated.  3. Right ventricular systolic function is normal. The right ventricular size is normal.  4. The mitral valve is normal in structure. Mild mitral valve regurgitation. No evidence of mitral stenosis.  5. Tricuspid valve regurgitation is mild to moderate.  6. The aortic valve is normal in structure. Aortic valve regurgitation is not visualized. Moderate to severe aortic valve stenosis. Aortic valve mean gradient measures 29.0 mmHg.   7. The inferior vena cava is normal in size with greater than 50% respiratory variability, suggesting right atrial pressure of 3 mmHg. FINDINGS  Left Ventricle: Left ventricular ejection fraction, by estimation, is 65 to 70%. The left ventricle has normal function. The left ventricle has no regional wall motion abnormalities. The left ventricular internal cavity size was normal in size. There is  mild concentric left ventricular hypertrophy. Right Ventricle: The right ventricular size is normal. No increase in right ventricular wall thickness. Right ventricular systolic function is normal. Left Atrium: Left atrial size was normal in size. Right Atrium: Right atrial size was normal in size. Pericardium: There is no evidence of pericardial effusion. Mitral Valve: The mitral valve is normal in structure. Normal mobility of the mitral valve leaflets. Mild mitral valve regurgitation. No evidence of mitral valve stenosis. Tricuspid Valve: The tricuspid valve is normal in structure. Tricuspid valve regurgitation is mild to moderate. No evidence of tricuspid stenosis. Aortic Valve: The aortic valve is normal in structure.. There is severe thickening and severe calcifcation of the aortic valve. Aortic valve regurgitation is not visualized. Moderate to severe aortic stenosis is present. There is severe thickening of the  aortic valve. There is severe calcifcation of the aortic valve. Aortic valve mean gradient measures 29.0 mmHg. Aortic valve peak gradient measures 6.2 mmHg. Aortic valve area, by VTI measures 2.89 cm. Pulmonic Valve: The pulmonic valve was normal in structure. Pulmonic valve regurgitation is not visualized. No evidence of pulmonic stenosis. Aorta: The aortic root is normal in size and structure. Venous: The inferior vena cava is normal in size with greater than 50% respiratory variability, suggesting right atrial pressure of 3 mmHg. IAS/Shunts: No atrial level shunt detected by color flow Doppler.  LEFT  VENTRICLE PLAX 2D LVOT diam:     2.00 cm LV SV:         80 LV SV Index:   40 LVOT Area:     3.14 cm  AORTIC VALVE AV Area (Vmax):    2.17 cm AV Area (Vmean):   2.48 cm AV Area (VTI):     2.89 cm AV Vmax:           125.00 cm/s AV Vmean:          73.200 cm/s AV VTI:            0.277 m AV Peak Grad:      6.2 mmHg AV Mean Grad:      29.0  mmHg LVOT Vmax:         86.50 cm/s LVOT Vmean:        57.900 cm/s LVOT VTI:          0.255 m LVOT/AV VTI ratio: 0.92  SHUNTS Systemic VTI:  0.26 m Systemic Diam: 2.00 cm Ena Dawley MD Electronically signed by Ena Dawley MD Signature Date/Time: 11/09/2019/4:50:41 PM    Final    Structural Heart Procedure  Result Date: 11/09/2019 See surgical note for result.   Procedures Procedures (including critical care time)  Medications Ordered in ED Medications  ceFEPIme (MAXIPIME) 2 g in sodium chloride 0.9 % 100 mL IVPB (0 g Intravenous Stopped 11/11/19 0037)  vancomycin (VANCOREADY) IVPB 1250 mg/250 mL (0 mg Intravenous Stopped 11/11/19 0208)  aspirin chewable tablet 81 mg (has no administration in time range)  simvastatin (ZOCOR) tablet 20 mg (has no administration in time range)  clopidogrel (PLAVIX) tablet 75 mg (has no administration in time range)  ferrous sulfate tablet 325 mg (has no administration in time range)  ondansetron (ZOFRAN) tablet 4 mg (has no administration in time range)    Or  ondansetron (ZOFRAN) injection 4 mg (has no administration in time range)  insulin aspart (novoLOG) injection 0-9 Units (has no administration in time range)  enoxaparin (LOVENOX) injection 50 mg (has no administration in time range)  0.9 %  sodium chloride infusion ( Intravenous Rate/Dose Change 11/11/19 0544)  metroNIDAZOLE (FLAGYL) IVPB 500 mg (has no administration in time range)  hydrALAZINE (APRESOLINE) injection 10 mg (has no administration in time range)  polyvinyl alcohol (LIQUIFILM TEARS) 1.4 % ophthalmic solution 1 drop (has no administration in time range)    acetaminophen (TYLENOL) tablet 650 mg (650 mg Oral Given 11/10/19 2317)  metroNIDAZOLE (FLAGYL) IVPB 500 mg (0 mg Intravenous Stopped 11/11/19 0037)  fentaNYL (SUBLIMAZE) injection 50 mcg (50 mcg Intravenous Given 11/11/19 0036)  iohexol (OMNIPAQUE) 350 MG/ML injection 100 mL (100 mLs Intravenous Contrast Given 11/10/19 2353)  0.9 %  sodium chloride infusion (0 mLs Intravenous Stopped 11/11/19 0544)  fentaNYL (SUBLIMAZE) injection 50 mcg (50 mcg Intravenous Given 11/11/19 0519)    ED Course  I have reviewed the triage vital signs and the nursing notes.  Pertinent labs & imaging results that were available during my care of the patient were reviewed by me and considered in my medical decision making (see chart for details).    Diana Harmon was evaluated in Emergency Department on 11/11/2019 for the symptoms described in the history of present illness. He/she was evaluated in the context of the global COVID-19 pandemic, which necessitated consideration that the patient might be at risk for infection with the SARS-CoV-2 virus that causes COVID-19. Institutional protocols and algorithms that pertain to the evaluation of patients at risk for COVID-19 are in a state of rapid change based on information released by regulatory bodies including the CDC and federal and state organizations. These policies and algorithms were followed during the patient's care in the ED.  MDM Rules/Calculators/A&P                         Patient presents to the ED with complaints of fever, chills, & abdominal discomfort today. Recent TAVR procedure yesterday with discharge home this afternoon. Nontoxic, vitals with fever, tachycardia, & hypertension. Concern for sepsis, abx started for unknown source, no hypotension to necessitate 30 cc/kg bolus at this time- will continue to monitor.   Additional history obtained:  Additional history  obtained from patient's daughter. Previous records obtained and reviewed.   Lab Tests:  I  Ordered, reviewed, and interpreted labs, which included:  CBC: No leukocytosis. Anemia improved from most recent labs CMP: Hyperglycemia with mildly low bicarb, normal anion gap. Mildly elevated BUN/creatinine compared to most recent. No significant electrolyte derangement.  PT/INR/APTT: WNL  Lactic acid: 1.8 UA: No obvious UTI.  COVID: Negative   Imaging Studies ordered:  CXR ordered per triage, I ordered imaging studies which included CTA dissection study, I independently visualized and interpreted imaging which showed:  CXR:  Minimal left lung base atelectasis. No focal consolidation CTA chest/abdomen/pelvis: Changes consistent with prior TAVR and bilateral femoral arterial access. No evidence of aortic dissection or aneurysmal dilatation is seen. No retroperitoneal hematoma is noted. Mild subpleural scarring in the left base. No acute abnormality in the abdomen and pelvis is seen  ED Course:  Antibiotics initiated.  Tylenol given for fever. Work-up unrevealing in terms of source of fever.  Discussed with Dr. Marlene Bast for cardiology, in agreement with plan for hospitalist service admission for further work-up.   02:24: CONSULT: Discussed with hospitalist Dr. Hal Hope- accepts admission.   Findings and plan of care discussed with supervising physician Dr. Betsey Holiday who has evaluated the patient & is in agreement.   Portions of this note were generated with [employer]. Dictation errors may occur despite best attempts at proofreading.  Final Clinical Impression(s) / ED Diagnoses Final diagnoses:  Fever, unspecified fever cause    Rx / DC Orders ED Discharge Orders    None       Amaryllis Dyke, PA-C 11/11/19 5844    Orpah Greek, MD 11/11/19 [phone]

## 2019-11-10 NOTE — Discharge Summary (Signed)
[facility] VALVE TEAM  Discharge Summary    Patient ID: Diana Harmon MRN: [id number]; DOB: [birth year]  Admit date: 11/09/2019 Discharge date: 11/10/2019  Primary Care Provider: [name], DO  Primary Cardiologist:  Dr. Saunders Revel / Dr. Angelena Form & Dr. Cyndia Bent (TAVR)  Discharge Diagnoses    Principal Problem:   S/P TAVR (transcatheter aortic valve replacement) Active Problems:   Type 2 diabetes mellitus with other specified complication [facility])   Essential hypertension   Anemia due to multiple mechanisms   Morbid obesity ([place])   Chronic heart failure with preserved ejection fraction (HFpEF) ([place])   Patient is [name]   Severe aortic stenosis   Thyroid disease   Hypertension   Allergies Allergies  Allergen Reactions  . Ferumoxytol Swelling    Swelling in neck  . Other     Other reaction(s): Other (See Comments) She refuses blood products-Jehovah witness  . Penicillins Rash, Swelling and Hives    Diagnostic Studies/Procedures  TAVR OPERATIVE NOTE   Date of Procedure:                11/09/2019  Preoperative Diagnosis:      Severe Aortic Stenosis   Postoperative Diagnosis:    Same   Procedure:        Transcatheter Aortic Valve Replacement - Percutaneous Right Transfemoral Approach             Edwards Sapien 3 Ultra THV (size 26 mm, model # 9750TFX, serial # [id number])              Co-Surgeons:                        Gaye Pollack, MD  /  Lauree Chandler, MD   Anesthesiologist:                  Perfecto Kingdom, MD  Echocardiographer:              Liane Comber, MD  Pre-operative Echo Findings: ? Severe aortic stenosis ? Normal left ventricular systolic function  Post-operative Echo Findings: ? NO paravalvular leak ? Normal left ventricular systolic function  _____________   Echo: completed but pending formal read at the time of discharge    History of Present Illness       Diana Harmon is a 74 y.o. female with a history of chronic systolic CHF, RBBB, obesity, HTN, HLD, diabetes mellitus, thyroid disease, anemia on chronic iron infusions, and severe aortic stenosis who presented to [facility] on 11/09/19 for planned TAVR.   Pt has a history of aortic stenosis. A recent echocardiogram on 08/19/2019 showed an increase in the mean gradient to 32 mmHg with a peak gradient of 75 mmHg.  Aortic valve area had decreased to 0.85 cm consistent with severe aortic stenosis.  Left ventricular ejection fraction is 60 to 65%.  She underwent cardiac catheterization on 10/19/2019 showing no evidence of coronary disease.  The mean gradient by catheterization was 29 mmHg with an aortic valve area of 0.8 cm.  The patient has been evaluated by the multidisciplinary valve team and felt to have severe, symptomatic aortic stenosis and to be a suitable candidate for TAVR, which was set up for 11/09/19.   Hospital Course     Consultants: none   Severe AS:s/p successful TAVR with a 26 mm Edwards Sapien 3 Ultra THV via the TF approach on 11/09/19. Post operative echo completed but  pending formal read. Groin sites are stable. ECG with sinus and old RBBB. There has been no HAVB. She was started on Aspirin and plavix. Plan for DC home today with close outpatient follow up in the office next week.   DMT2: treated with SSI while admitted. Okay to resume Metformin after 48 hours from contrast dye exposure (7/9 AM)  HTN: resume home meds at DC.  Chronic anemia: [name]. Hgb stable postop with minimal drop from preop. Continue iron at home and iron infusions with Dr. Burlene Arnt. _____________  Discharge Vitals Blood pressure (!) 126/51, pulse 88, temperature 98.6 F (37 C), temperature source Oral, resp. rate 19, height 5' (1.524 m), weight 105.3 kg, SpO2 100 %.  Filed Weights   11/09/19 1120 11/10/19 0600  Weight: 106.3 kg 105.3 kg    Labs & Radiologic Studies    CBC Recent Labs     11/09/19 1715 11/10/19 0454  WBC  --  4.2  HGB 10.2* 9.4*  HCT 30.0* 30.9*  MCV  --  99.0  PLT  --  945   Basic Metabolic Panel Recent Labs    11/09/19 1715 11/10/19 0454  NA 141 138  K 4.4 4.5  CL 105 105  CO2  --  23  GLUCOSE 253* 182*  BUN 25* 26*  CREATININE 0.80 1.03*  CALCIUM  --  9.2  MG  --  1.9   Liver Function Tests No results for input(s): AST, ALT, ALKPHOS, BILITOT, PROT, ALBUMIN in the last 72 hours. No results for input(s): LIPASE, AMYLASE in the last 72 hours. Cardiac Enzymes No results for input(s): CKTOTAL, CKMB, CKMBINDEX, TROPONINI in the last 72 hours. BNP Invalid input(s): POCBNP D-Dimer No results for input(s): DDIMER in the last 72 hours. Hemoglobin A1C No results for input(s): HGBA1C in the last 72 hours. Fasting Lipid Panel No results for input(s): CHOL, HDL, LDLCALC, TRIG, CHOLHDL, LDLDIRECT in the last 72 hours. Thyroid Function Tests No results for input(s): TSH, T4TOTAL, T3FREE, THYROIDAB in the last 72 hours.  Invalid input(s): FREET3 _____________  DG Chest 2 View  Result Date: 11/05/2019 CLINICAL DATA:  Preop TAVR EXAM: CHEST - 2 VIEW COMPARISON:  03/25/2019 FINDINGS: The heart size and mediastinal contours are within normal limits. Aortic atherosclerosis. Both lungs are clear. The visualized skeletal structures are unremarkable. IMPRESSION: No active cardiopulmonary disease. Electronically Signed   By: Donavan Foil M.D.   On: 11/05/2019 19:59   CARDIAC CATHETERIZATION  Result Date: 10/19/2019 Conclusions: 1. No angiographically significant coronary artery disease. 2. Moderate to severe aortic stenosis (mean gradient 29 mmHg, AVA 0.8 cm^2). 3. Mildly elevated right heart filling pressure. 4. Upper normal to mildly elevated left heart filling pressure. 5. Normal Fick cardiac output/index. Recommendations: 1. Refer to valve clinic for further workup/management of moderate to severe aortic stenosis. 2. Discontinue aspirin, given chronic  anemia and lack of coronary artery disease. 3. Continue gentle diuresis. 4. Primary prevention of coronary artery disease. Nelva Bush, MD [facility]   CT CORONARY [facility] W/CTA COR W/SCORE Lewanda Rife W/CM &/OR WO/CM  Addendum Date: 11/02/2019   ADDENDUM REPORT: 11/02/2019 17:55 CLINICAL DATA:  Severe Aortic Stenosis. EXAM: Cardiac TAVR CT TECHNIQUE: The patient was scanned on a [employer]. A 120 kV retrospective scan was triggered in the descending thoracic aorta at 111 HU's. Gantry rotation speed was 250 msecs and collimation was .6 mm. No beta blockade or nitro were given. The 3D data set was reconstructed in 5% intervals of the R-R cycle. Systolic  and diastolic phases were analyzed on a dedicated work station using MPR, MIP and VRT modes. The patient received 80 cc of contrast. FINDINGS: Image quality: Excellent. Noise artifact is: Limited. Valve Morphology: The aortic valve is tricuspid. There is asymmetric bulky calcification of the LCC extending to the leaflet base. The leaflets exhibit severely restricted leaflet motion in systole consistent with severe aortic stenosis. Aortic Valve Calcium score: 1548 Aortic annular dimension: Phase assessed: 15% Annular area: 441 mm2 Annular perimeter: 75.9 mm Max diameter: 26.5 mm Min diameter: 23.2 mm Annular and subannular calcification: No significant annular or subannular calcifications. Optimal coplanar projection: LAO 10 CRA 1 Coronary Artery Height above Annulus: Left Main: 14.1 mm Right Coronary: 16.7 mm Sinus of Valsalva Measurements: Non-coronary: 30 mm Right-coronary: 29 mm Left-coronary: 31 mm Sinus of Valsalva Height: Non-coronary: 20.9 mm Right-coronary: 21.7 mm Left-coronary: 19.8 mm Sinotubular Junction: 30 mm.  Mild calcified plaque. Ascending Thoracic Aorta: 32 mm. Coronary Arteries: Normal coronary origin. Right dominance. The study was performed without use of NTG and is insufficient for plaque evaluation. Please refer to recent cardiac  catheterization for coronary assessment. Cardiac Morphology: Right Atrium: Right atrial size is within normal limits. Right Ventricle: The right ventricular cavity is within normal limits. Left Atrium: Left atrial size is normal in size with no left atrial appendage filling defect. Left Ventricle: The ventricular cavity size is within normal limits. There are no stigmata of prior infarction. There is no abnormal filling defect. LVEF=69%. Pulmonary arteries: Normal in size without proximal filling defect. Pulmonary veins: Normal pulmonary venous drainage. Pericardium: Normal thickness with no significant effusion or calcium present. Mitral Valve: The mitral valve is normal structure without significant calcification. Extra-cardiac findings: See attached radiology report for non-cardiac structures. IMPRESSION: 1. Annular measurements (441 mm2) appropriate for 26 mm Edwards Sapien 3. 2. No significant annular or subannular calcifications. 3. Sufficient coronary to annulus distance. 4. Optimal Fluoroscopic Angle for Delivery: LAO 10 CRA 1 [place] T. Audie Box, MD Electronically Signed   By: Eleonore Chiquito   On: 11/02/2019 17:55   Result Date: 11/02/2019 EXAM: OVER-READ INTERPRETATION  CT CHEST The following report is an over-read performed by radiologist Dr. Vinnie Langton of [facility], [place] on 11/02/2019. This over-read does not include interpretation of cardiac or coronary anatomy or pathology. The coronary calcium score/coronary CTA interpretation by the cardiologist is attached. COMPARISON:  None. FINDINGS: Extracardiac findings will be described separately in dictation for contemporaneously obtained CTA chest, abdomen and pelvis. IMPRESSION: Please see separate dictation for contemporaneously obtained CTA chest, abdomen and pelvis dated 11/02/2019 for full description of relevant extracardiac findings. Electronically Signed: By: Vinnie Langton M.D. On: 11/02/2019 11:59   DG Chest Port 1 View  Result  Date: 11/09/2019 CLINICAL DATA:  74 year old female with TAVR. EXAM: PORTABLE CHEST 1 VIEW COMPARISON:  Chest radiograph dated 11/05/2019. FINDINGS: Bibasilar minimal atelectasis. No focal consolidation, pleural effusion, or pneumothorax. Mild cardiomegaly. Aortic valve repair. No acute osseous pathology. IMPRESSION: Status post TAVR.  No acute findings. Electronically Signed   By: Anner Crete M.D.   On: 11/09/2019 19:34   CT ANGIO CHEST AORTA W/CM & OR WO/CM  Result Date: 11/02/2019 CLINICAL DATA:  74 year old female with history of severe aortic stenosis. Preprocedural study prior to potential transcatheter aortic valve replacement (TAVR) procedure. EXAM: CT ANGIOGRAPHY CHEST, ABDOMEN AND PELVIS TECHNIQUE: Non-contrast CT of the chest was initially obtained. Multidetector CT imaging through the chest, abdomen and pelvis was performed using the standard protocol during bolus administration of intravenous  contrast. Multiplanar reconstructed images and MIPs were obtained and reviewed to evaluate the vascular anatomy. CONTRAST:  135m OMNIPAQUE IOHEXOL 350 MG/ML SOLN COMPARISON:  No prior chest CT. CT the abdomen and pelvis 03/25/2019. FINDINGS: CTA CHEST FINDINGS Cardiovascular: Heart size is mildly enlarged. There is no significant pericardial fluid, thickening or pericardial calcification. There is aortic atherosclerosis, as well as atherosclerosis of the great vessels of the mediastinum and the coronary arteries, including calcified atherosclerotic plaque in the left main coronary artery. Thickening calcification of the aortic valve. Mediastinum/Lymph Nodes: No pathologically enlarged mediastinal or hilar lymph nodes. Esophagus is unremarkable in appearance. No axillary lymphadenopathy. Lungs/Pleura: No acute consolidative airspace disease. No pleural effusions. No suspicious appearing pulmonary nodules or masses are noted. Musculoskeletal/Soft Tissues: There are no aggressive appearing lytic or blastic  lesions noted in the visualized portions of the skeleton. CTA ABDOMEN AND PELVIS FINDINGS Hepatobiliary: Diffuse low attenuation throughout the hepatic parenchyma, indicative of a background of hepatic steatosis. Liver has a slightly shrunken appearance and nodular contour, indicative of underlying cirrhosis. No suspicious cystic or solid hepatic lesions. No intra or extrahepatic biliary ductal dilatation. Gallbladder is normal in appearance. Pancreas: No pancreatic mass. No pancreatic no pancreatic ductal dilatation or peripancreatic fluid collections or inflammatory changes. Spleen: Unremarkable. Adrenals/Urinary Tract: Bilateral kidneys and adrenal glands are normal in appearance. No hydroureteronephrosis. Urinary bladder is normal in appearance. Stomach/Bowel: Normal appearance of the stomach. No pathologic dilatation of small bowel or colon. A few scattered colonic diverticulae are noted, without surrounding inflammatory changes are noted to suggest acute diverticulitis. Normal appendix. Vascular/Lymphatic: Aortic atherosclerosis, with findings and measurements pertinent to potential TAVR procedure, as detailed below. No aneurysm or dissection noted in the abdominal or pelvic vasculature. No lymphadenopathy noted in the abdomen or pelvis. Reproductive: Uterus and ovaries are unremarkable in appearance. Other: No significant volume of ascites.  No pneumoperitoneum. Musculoskeletal: There are no aggressive appearing lytic or blastic lesions noted in the visualized portions of the skeleton. VASCULAR MEASUREMENTS PERTINENT TO TAVR: AORTA: Minimal Aortic Diameter-16 x 16 mm Severity of Aortic Calcification-mild RIGHT PELVIS: Right Common Iliac Artery - Minimal Diameter-9.6 x 9.0 mm Tortuosity-mild Calcification-mild-to-moderate Right External Iliac Artery - Minimal Diameter-7.5 x 7.8 mm Tortuosity-mild-to-moderate Calcification-none Right Common Femoral Artery - Minimal Diameter-8.5 x 7.6 mm Tortuosity-mild  Calcification-none LEFT PELVIS: Left Common Iliac Artery - Minimal Diameter-9.7 x 10.3 mm Tortuosity-mild Calcification-mild Left External Iliac Artery - Minimal Diameter-7.7 x 6.9 mm Tortuosity-mild-to-moderate Calcification-none Left Common Femoral Artery - Minimal Diameter-8.3 x 7.4 mm Tortuosity-mild Calcification-mild Review of the MIP images confirms the above findings. IMPRESSION: 1. Vascular findings and measurements pertinent to potential TAVR procedure, as detailed above. 2. Severe thickening calcification of the aortic valve, compatible with the reported clinical history of severe aortic stenosis. 3. Mild cardiomegaly. 4. Aortic atherosclerosis, in addition to left main coronary artery disease. Assessment for potential risk factor modification, dietary therapy or pharmacologic therapy may be warranted, if clinically indicated. 5. Hepatic steatosis. 6. Morphologic changes in the liver suggesting early cirrhosis. 7. Colonic diverticulosis without evidence of acute diverticulitis at this time. 8. Additional incidental findings, as above. Electronically Signed   By: DVinnie LangtonM.D.   On: 11/02/2019 12:10   VAS [place]OTID  Result Date: 11/05/2019 Carotid Arterial Duplex Study Indications:       Severe aortic stenosis, pre-TAVR. Risk Factors:      Hypertension, prior TIA. Comparison Study:  No prior studies. Performing Technologist: RDarlin Coco Examination Guidelines: A complete evaluation includes B-mode imaging, spectral  Doppler, color Doppler, and power Doppler as needed of all accessible portions of each vessel. Bilateral testing is considered an integral part of a complete examination. Limited examinations for reoccurring indications may be performed as noted.  Right Carotid Findings: +----------+--------+--------+--------+------------------+------------------+           PSV cm/sEDV cm/sStenosisPlaque DescriptionComments            +----------+--------+--------+--------+------------------+------------------+ CCA Prox  90      17                                intimal thickening +----------+--------+--------+--------+------------------+------------------+ CCA Distal66      13                                intimal thickening +----------+--------+--------+--------+------------------+------------------+ ICA Prox  65      18      1-39%   heterogenous                         +----------+--------+--------+--------+------------------+------------------+ ICA Distal90      27                                                   +----------+--------+--------+--------+------------------+------------------+ ECA       83      11                                                   +----------+--------+--------+--------+------------------+------------------+ +----------+--------+-------+----------------+-------------------+           PSV cm/sEDV cmsDescribe        Arm Pressure (mmHG) +----------+--------+-------+----------------+-------------------+ [id number]            Multiphasic, WNL                    +----------+--------+-------+----------------+-------------------+ +---------+--------+--+--------+--+---------+ VertebralPSV cm/s54EDV cm/s14Antegrade +---------+--------+--+--------+--+---------+  Left Carotid Findings: +----------+--------+--------+--------+------------------+------------------+           PSV cm/sEDV cm/sStenosisPlaque DescriptionComments           +----------+--------+--------+--------+------------------+------------------+ CCA Prox  74      19                                intimal thickening +----------+--------+--------+--------+------------------+------------------+ CCA Distal58      14                                intimal thickening +----------+--------+--------+--------+------------------+------------------+ ICA Prox  58      20      1-39%   calcific                              +----------+--------+--------+--------+------------------+------------------+ ICA Distal84      25                                                   +----------+--------+--------+--------+------------------+------------------+  ECA       75      9                                                    +----------+--------+--------+--------+------------------+------------------+ +----------+--------+--------+----------------+-------------------+           PSV cm/sEDV cm/sDescribe        Arm Pressure (mmHG) +----------+--------+--------+----------------+-------------------+ [id number]              Multiphasic, WNL                    +----------+--------+--------+----------------+-------------------+ +---------+--------+--+--------+--+---------+ VertebralPSV cm/s59EDV cm/s19Antegrade +---------+--------+--+--------+--+---------+   Summary: Right Carotid: Velocities in the right ICA are consistent with a 1-39% stenosis.                The extracranial vessels were near-normal with only minimal wall                thickening or plaque. Left Carotid: Velocities in the left ICA are consistent with a 1-39% stenosis.               The extracranial vessels were near-normal with only minimal wall               thickening or plaque. Vertebrals:  Bilateral vertebral arteries demonstrate antegrade flow. Subclavians: Normal flow hemodynamics were seen in bilateral subclavian              arteries. *See table(s) above for measurements and observations.  Electronically signed by Monica Martinez MD on 11/05/2019 at 2:39:03 PM.    Final    ECHOCARDIOGRAM LIMITED  Result Date: 11/09/2019    ECHOCARDIOGRAM LIMITED REPORT   Patient Name:   Diana Harmon Date of Exam: 11/09/2019 Medical Rec #:  [id number]               Height:       60.0 in Accession #:    [id number]              Weight:       235.5 lb Date of Birth:  12-18-1945               BSA:          2.001 m  Patient Age:    75 years                BP:           170/65 mmHg Patient Gender: F                       HR:           104 bpm. Exam Location:  Inpatient Procedure: Limited Echo, Color Doppler and Cardiac Doppler Indications:     Aortic Stenosis i35.0  History:         Patient has prior history of Echocardiogram examinations, most                  recent 08/20/2019. CHF; Risk Factors:Hypertension, Diabetes and                  Dyslipidemia.  Sonographer:     Raquel Sarna Senior RDCS Referring Phys:  [place] Phys: Ena Dawley MD IMPRESSIONS  1. Limited periprocedural TTE during a TAVR procedure. A 26 mm  Edwards-SAPIEN 3 Ultra valve was successfully deployed in the aortic position with improvement of peak/mean transaortic gradients 49/29 mmHg to 6/3 mmHg. There was no paravalvular leak. No pericardial effusion was seen prior and post procedure.  2. Left ventricular ejection fraction, by estimation, is 65 to 70%. The left ventricle has normal function. The left ventricle has no regional wall motion abnormalities. There is mild concentric left ventricular hypertrophy. Left ventricular diastolic function could not be evaluated.  3. Right ventricular systolic function is normal. The right ventricular size is normal.  4. The mitral valve is normal in structure. Mild mitral valve regurgitation. No evidence of mitral stenosis.  5. Tricuspid valve regurgitation is mild to moderate.  6. The aortic valve is normal in structure. Aortic valve regurgitation is not visualized. Moderate to severe aortic valve stenosis. Aortic valve mean gradient measures 29.0 mmHg.  7. The inferior vena cava is normal in size with greater than 50% respiratory variability, suggesting right atrial pressure of 3 mmHg. FINDINGS  Left Ventricle: Left ventricular ejection fraction, by estimation, is 65 to 70%. The left ventricle has normal function. The left ventricle has no regional wall motion abnormalities. The left  ventricular internal cavity size was normal in size. There is  mild concentric left ventricular hypertrophy. Right Ventricle: The right ventricular size is normal. No increase in right ventricular wall thickness. Right ventricular systolic function is normal. Left Atrium: Left atrial size was normal in size. Right Atrium: Right atrial size was normal in size. Pericardium: There is no evidence of pericardial effusion. Mitral Valve: The mitral valve is normal in structure. Normal mobility of the mitral valve leaflets. Mild mitral valve regurgitation. No evidence of mitral valve stenosis. Tricuspid Valve: The tricuspid valve is normal in structure. Tricuspid valve regurgitation is mild to moderate. No evidence of tricuspid stenosis. Aortic Valve: The aortic valve is normal in structure.. There is severe thickening and severe calcifcation of the aortic valve. Aortic valve regurgitation is not visualized. Moderate to severe aortic stenosis is present. There is severe thickening of the  aortic valve. There is severe calcifcation of the aortic valve. Aortic valve mean gradient measures 29.0 mmHg. Aortic valve peak gradient measures 6.2 mmHg. Aortic valve area, by VTI measures 2.89 cm. Pulmonic Valve: The pulmonic valve was normal in structure. Pulmonic valve regurgitation is not visualized. No evidence of pulmonic stenosis. Aorta: The aortic root is normal in size and structure. Venous: The inferior vena cava is normal in size with greater than 50% respiratory variability, suggesting right atrial pressure of 3 mmHg. IAS/Shunts: No atrial level shunt detected by color flow Doppler.  LEFT VENTRICLE PLAX 2D LVOT diam:     2.00 cm LV SV:         80 LV SV Index:   40 LVOT Area:     3.14 cm  AORTIC VALVE AV Area (Vmax):    2.17 cm AV Area (Vmean):   2.48 cm AV Area (VTI):     2.89 cm AV Vmax:           125.00 cm/s AV Vmean:          73.200 cm/s AV VTI:            0.277 m AV Peak Grad:      6.2 mmHg AV Mean Grad:      29.0  mmHg LVOT Vmax:         86.50 cm/s LVOT Vmean:        57.900 cm/s LVOT VTI:  0.255 m LVOT/AV VTI ratio: 0.92  SHUNTS Systemic VTI:  0.26 m Systemic Diam: 2.00 cm Ena Dawley MD Electronically signed by Ena Dawley MD Signature Date/Time: 11/09/2019/4:50:41 PM    Final    Structural Heart Procedure  Result Date: 11/09/2019 See surgical note for result.  CT Angio Abd/Pel w/ and/or w/o  Result Date: 11/02/2019 CLINICAL DATA:  74 year old female with history of severe aortic stenosis. Preprocedural study prior to potential transcatheter aortic valve replacement (TAVR) procedure. EXAM: CT ANGIOGRAPHY CHEST, ABDOMEN AND PELVIS TECHNIQUE: Non-contrast CT of the chest was initially obtained. Multidetector CT imaging through the chest, abdomen and pelvis was performed using the standard protocol during bolus administration of intravenous contrast. Multiplanar reconstructed images and MIPs were obtained and reviewed to evaluate the vascular anatomy. CONTRAST:  128m OMNIPAQUE IOHEXOL 350 MG/ML SOLN COMPARISON:  No prior chest CT. CT the abdomen and pelvis 03/25/2019. FINDINGS: CTA CHEST FINDINGS Cardiovascular: Heart size is mildly enlarged. There is no significant pericardial fluid, thickening or pericardial calcification. There is aortic atherosclerosis, as well as atherosclerosis of the great vessels of the mediastinum and the coronary arteries, including calcified atherosclerotic plaque in the left main coronary artery. Thickening calcification of the aortic valve. Mediastinum/Lymph Nodes: No pathologically enlarged mediastinal or hilar lymph nodes. Esophagus is unremarkable in appearance. No axillary lymphadenopathy. Lungs/Pleura: No acute consolidative airspace disease. No pleural effusions. No suspicious appearing pulmonary nodules or masses are noted. Musculoskeletal/Soft Tissues: There are no aggressive appearing lytic or blastic lesions noted in the visualized portions of the skeleton. CTA  ABDOMEN AND PELVIS FINDINGS Hepatobiliary: Diffuse low attenuation throughout the hepatic parenchyma, indicative of a background of hepatic steatosis. Liver has a slightly shrunken appearance and nodular contour, indicative of underlying cirrhosis. No suspicious cystic or solid hepatic lesions. No intra or extrahepatic biliary ductal dilatation. Gallbladder is normal in appearance. Pancreas: No pancreatic mass. No pancreatic no pancreatic ductal dilatation or peripancreatic fluid collections or inflammatory changes. Spleen: Unremarkable. Adrenals/Urinary Tract: Bilateral kidneys and adrenal glands are normal in appearance. No hydroureteronephrosis. Urinary bladder is normal in appearance. Stomach/Bowel: Normal appearance of the stomach. No pathologic dilatation of small bowel or colon. A few scattered colonic diverticulae are noted, without surrounding inflammatory changes are noted to suggest acute diverticulitis. Normal appendix. Vascular/Lymphatic: Aortic atherosclerosis, with findings and measurements pertinent to potential TAVR procedure, as detailed below. No aneurysm or dissection noted in the abdominal or pelvic vasculature. No lymphadenopathy noted in the abdomen or pelvis. Reproductive: Uterus and ovaries are unremarkable in appearance. Other: No significant volume of ascites.  No pneumoperitoneum. Musculoskeletal: There are no aggressive appearing lytic or blastic lesions noted in the visualized portions of the skeleton. VASCULAR MEASUREMENTS PERTINENT TO TAVR: AORTA: Minimal Aortic Diameter-16 x 16 mm Severity of Aortic Calcification-mild RIGHT PELVIS: Right Common Iliac Artery - Minimal Diameter-9.6 x 9.0 mm Tortuosity-mild Calcification-mild-to-moderate Right External Iliac Artery - Minimal Diameter-7.5 x 7.8 mm Tortuosity-mild-to-moderate Calcification-none Right Common Femoral Artery - Minimal Diameter-8.5 x 7.6 mm Tortuosity-mild Calcification-none LEFT PELVIS: Left Common Iliac Artery - Minimal  Diameter-9.7 x 10.3 mm Tortuosity-mild Calcification-mild Left External Iliac Artery - Minimal Diameter-7.7 x 6.9 mm Tortuosity-mild-to-moderate Calcification-none Left Common Femoral Artery - Minimal Diameter-8.3 x 7.4 mm Tortuosity-mild Calcification-mild Review of the MIP images confirms the above findings. IMPRESSION: 1. Vascular findings and measurements pertinent to potential TAVR procedure, as detailed above. 2. Severe thickening calcification of the aortic valve, compatible with the reported clinical history of severe aortic stenosis. 3. Mild cardiomegaly. 4. Aortic atherosclerosis, in addition to left  main coronary artery disease. Assessment for potential risk factor modification, dietary therapy or pharmacologic therapy may be warranted, if clinically indicated. 5. Hepatic steatosis. 6. Morphologic changes in the liver suggesting early cirrhosis. 7. Colonic diverticulosis without evidence of acute diverticulitis at this time. 8. Additional incidental findings, as above. Electronically Signed   By: Vinnie Langton M.D.   On: 11/02/2019 12:10   Disposition   Pt is being discharged home today in good condition.  Follow-up Plans & Appointments     Discharge Instructions    Amb Referral to Cardiac Rehabilitation   Complete by: As directed    Diagnosis: Valve Replacement   Valve: Aortic Comment - TAVR   After initial evaluation and assessments completed: Virtual Based Care may be provided alone or in conjunction with Phase 2 Cardiac Rehab based on patient barriers.: Yes      Discharge Medications   Allergies as of 11/10/2019      Reactions   Ferumoxytol Swelling   Swelling in neck   Other    Other reaction(s): Other (See Comments) She refuses blood products-Jehovah witness   Penicillins Rash, Swelling, Hives      Medication List    STOP taking these medications   metoprolol tartrate 50 MG tablet Commonly known as: LOPRESSOR     TAKE these medications   Accu-Chek Nano  SmartView w/Device Kit Use to check blood glucose up to once daily as instructed   acetaminophen 500 MG tablet Commonly known as: TYLENOL Take 1,000 mg by mouth every 6 (six) hours as needed for moderate pain.   aspirin 81 MG chewable tablet Chew 1 tablet (81 mg total) by mouth daily. Start taking on: November 11, 2019   B COMPLEX-C PO Take 10 mLs by mouth daily.   baclofen 10 MG tablet Commonly known as: LIORESAL TAKE 1 TO 2 TABLETS BY MOUTH TWICE DAILY AS NEEDED FOR MUSCLE SPASM What changed: See the new instructions.   clopidogrel 75 MG tablet Commonly known as: PLAVIX Take 1 tablet (75 mg total) by mouth daily with breakfast. Start taking on: November 11, 2019   furosemide 20 MG tablet Commonly known as: LASIX Take 1 tablet (20 mg total) by mouth daily as needed for edema. What changed: reasons to take this   gabapentin 100 MG capsule Commonly known as: NEURONTIN Start 1 capsule daily, increase by 1 cap every 2-3 days as tolerated up to 3 times a day, or may take 3 at once in evening. What changed:   how much to take  how to take this  when to take this  reasons to take this  additional instructions   GenTeal Tears 0.1-0.2-0.3 % Soln Place 1 drop into both eyes 3 (three) times daily as needed (dry/irritated eyes.).   glucose blood test strip Commonly known as: Accu-Chek SmartView Check blood glucose up to once daily as instructed   hydrochlorothiazide 25 MG tablet Commonly known as: HYDRODIURIL Take 1 tablet by mouth once daily   Iron 325 (65 Fe) MG Tabs TAKE 1 TABLET BY MOUTH TWICE DAILY WITH A MEAL What changed: See the new instructions.   lactobacillus acidophilus Tabs tablet Take 2 tablets by mouth 2 (two) times daily as needed (digestive health.).   levocetirizine 5 MG tablet Commonly known as: Xyzal Take 1 tablet (5 mg total) by mouth every evening. What changed:   when to take this  reasons to take this   losartan 50 MG tablet Commonly known  as: COZAAR Take 1 tablet by  mouth once daily   metFORMIN 500 MG tablet Commonly known as: GLUCOPHAGE Take 1 tablet (500 mg total) by mouth daily with supper. Notes to patient: Okay to resume Metformin after 48 hours from contrast dye exposure (7/9 AM)   mupirocin ointment 2 % Commonly known as: Bactroban Apply 1 application topically 2 (two) times daily.   simvastatin 20 MG tablet Commonly known as: ZOCOR TAKE 1 TABLET BY MOUTH ONCE DAILY AT 6 PM What changed:   how much to take  how to take this  when to take this  additional instructions   Vitamin D3 125 MCG (5000 UT) Tabs Take 5,000 Units by mouth daily.           Outstanding Labs/Studies   none  Duration of Discharge Encounter   Greater than 30 minutes including physician time.  SignedAngelena Form, PA-C 11/10/2019, 11:20 AM [phone]

## 2019-11-10 NOTE — Progress Notes (Signed)
  Echocardiogram 2D Echocardiogram has been performed.  Diana Harmon 11/10/2019, 9:29 AM

## 2019-11-10 NOTE — Progress Notes (Signed)
Patient ambulated 350 feet  in the hallway using front wheel walker with RN on room air tolerated well.

## 2019-11-10 NOTE — Progress Notes (Signed)
Mobility Specialist - Progress Note   11/10/19 1438  Mobility  Activity  (Cancel)  Mobility performed by Mobility specialist   Will not see pt today due to d/c.  Pricilla Handler Mobility Specialist Mobility Specialist Phone: [phone]

## 2019-11-10 NOTE — ED Triage Notes (Signed)
Pt arrives brought in by her daughter, stating pt was just d/c today after having a heart procedure. She has had a 102 fever at home today. Pt c/o right side pain.

## 2019-11-10 NOTE — Plan of Care (Signed)
Continue to monitor

## 2019-11-10 NOTE — Progress Notes (Signed)
1 Day Post-Op Procedure(s) (LRB): TRANSCATHETER AORTIC VALVE REPLACEMENT, TRANSFEMORAL (N/A) INTRAOPERATIVE TRANSTHORACIC ECHOCARDIOGRAM (N/A) Subjective: No complaints. Ambulated some overnight. No shortness of breath or chest pain.  Objective: Vital signs in last 24 hours: Temp:  [97.6 F (36.4 C)-98.3 F (36.8 C)] 98 F (36.7 C) (07/07 0400) Pulse Rate:  [0-161] 74 (07/07 0400) Cardiac Rhythm: Heart block;Bundle branch block (07/06 1900) Resp:  [15-21] 18 (07/07 0400) BP: (93-171)/(46-66) 147/64 (07/07 0400) SpO2:  [96 %-100 %] 100 % (07/07 0400) Weight:  [105.3 kg-106.3 kg] 105.3 kg (07/07 0600)  Hemodynamic parameters for last 24 hours:    Intake/Output from previous day: 07/06 0701 - 07/07 0700 In: 1660 [P.O.:360; I.V.:900; IV Piggyback:400] Out: 1100 [Urine:1050; Blood:50] Intake/Output this shift: No intake/output data recorded.  General appearance: alert and cooperative Neurologic: intact Heart: regular rate and rhythm, S1, S2 normal, no murmur Lungs: clear to auscultation bilaterally Extremities: warm, no edema Wound: groin sites ok  Lab Results: Recent Labs    11/09/19 1715 11/10/19 0454  WBC  --  4.2  HGB 10.2* 9.4*  HCT 30.0* 30.9*  PLT  --  200   BMET:  Recent Labs    11/09/19 1715 11/10/19 0454  NA 141 138  K 4.4 4.5  CL 105 105  CO2  --  23  GLUCOSE 253* 182*  BUN 25* 26*  CREATININE 0.80 1.03*  CALCIUM  --  9.2    PT/INR: No results for input(s): LABPROT, INR in the last 72 hours. ABG    Component Value Date/Time   PHART 7.338 (L) 11/09/2019 1420   HCO3 25.6 11/09/2019 1420   TCO2 23 11/09/2019 1715   O2SAT 100.0 11/09/2019 1420   CBG (last 3)  Recent Labs    11/09/19 1853 11/09/19 2110 11/10/19 0645  GLUCAP 207* 244* 161*   ECG: sinus, RBBB ( old)  Assessment/Plan: S/P Procedure(s) (LRB): TRANSCATHETER AORTIC VALVE REPLACEMENT, TRANSFEMORAL (N/A) INTRAOPERATIVE TRANSTHORACIC ECHOCARDIOGRAM (N/A)  POD  1 Hemodynamically stable in sinus rhythm with chronic RBBB. Can resume previous HTN meds at discharge.  2D echo in progress.  Chronic anemia, [name]. Hgb stable postop with minimal drop from preop. Continue iron at home. Plan ASA and Plavix at discharge for TAVR valve.  Resume diabetes meds at home.  She can ambulate more this am and plan home if no changes.       LOS: 1 day    Gaye Pollack 11/10/2019

## 2019-11-10 NOTE — Progress Notes (Signed)
Pharmacy Antibiotic Note  Diana Harmon is a 74 y.o. female admitted on 11/10/2019 with fever of unknown origin.  Pharmacy has been consulted for Cefepime and vancomycin dosing. Of note patient was discharged from [facility] earlier today and was on vancomycin with last dose administered this morning at 0134. WBC wnl. LA wnl. SCr 1.2   Plan: -Cefepime 2 gm IV Q 12 hours -Vancomycin 1250 mg IV Q 24 hours to start now -Monitor CBC, renal fx, cultures and clinical progress     Temp (24hrs), Avg:99 F (37.2 C), Min:97.9 F (36.6 C), Max:102.4 F (39.1 C)  Recent Labs  Lab 11/05/19 1104 11/05/19 1104 11/09/19 1424 11/09/19 1539 11/09/19 1715 11/10/19 0454 11/10/19 2138  WBC 3.9*  --   --   --   --  4.2 6.3  CREATININE 1.06*   < > 0.80 0.90 0.80 1.03* 1.20*   < > = values in this interval not displayed.    Estimated Creatinine Clearance: 45.7 mL/min (A) (by C-G formula based on SCr of 1.2 mg/dL (H)).    Allergies  Allergen Reactions  . Ferumoxytol Swelling    Swelling in neck  . Other     Other reaction(s): Other (See Comments) She refuses blood products-Jehovah witness  . Penicillins Rash, Swelling and Hives    Antimicrobials this admission: Cefepime 7/7 >>  Vanc 7/5 >>   Dose adjustments this admission:  Microbiology results:  Thank you for allowing pharmacy to be a part of this patient's care.  Albertina Parr, PharmD., BCPS, BCCCP Clinical Pharmacist Clinical phone for 11/10/19 until 11:30pm: [phone] If after 11:30pm, please refer to [facility] for unit-specific pharmacist

## 2019-11-10 NOTE — Plan of Care (Signed)
Discharge to home °

## 2019-11-11 ENCOUNTER — Other Ambulatory Visit: Payer: Self-pay

## 2019-11-11 ENCOUNTER — Encounter (HOSPITAL_COMMUNITY): Payer: Self-pay | Admitting: Internal Medicine

## 2019-11-11 DIAGNOSIS — Z20822 Contact with and (suspected) exposure to covid-19: Secondary | ICD-10-CM | POA: Diagnosis present

## 2019-11-11 DIAGNOSIS — R509 Fever, unspecified: Secondary | ICD-10-CM | POA: Diagnosis not present

## 2019-11-11 DIAGNOSIS — A419 Sepsis, unspecified organism: Secondary | ICD-10-CM | POA: Diagnosis present

## 2019-11-11 DIAGNOSIS — N390 Urinary tract infection, site not specified: Secondary | ICD-10-CM | POA: Diagnosis present

## 2019-11-11 DIAGNOSIS — Z79899 Other long term (current) drug therapy: Secondary | ICD-10-CM | POA: Diagnosis not present

## 2019-11-11 DIAGNOSIS — I700 Atherosclerosis of aorta: Secondary | ICD-10-CM | POA: Diagnosis not present

## 2019-11-11 DIAGNOSIS — Z87891 Personal history of nicotine dependence: Secondary | ICD-10-CM | POA: Diagnosis not present

## 2019-11-11 DIAGNOSIS — Z8249 Family history of ischemic heart disease and other diseases of the circulatory system: Secondary | ICD-10-CM | POA: Diagnosis not present

## 2019-11-11 DIAGNOSIS — Z794 Long term (current) use of insulin: Secondary | ICD-10-CM | POA: Diagnosis not present

## 2019-11-11 DIAGNOSIS — Z87440 Personal history of urinary (tract) infections: Secondary | ICD-10-CM | POA: Diagnosis not present

## 2019-11-11 DIAGNOSIS — Z953 Presence of xenogenic heart valve: Secondary | ICD-10-CM | POA: Diagnosis not present

## 2019-11-11 DIAGNOSIS — R109 Unspecified abdominal pain: Secondary | ICD-10-CM | POA: Diagnosis not present

## 2019-11-11 DIAGNOSIS — Z7982 Long term (current) use of aspirin: Secondary | ICD-10-CM | POA: Diagnosis not present

## 2019-11-11 DIAGNOSIS — E1169 Type 2 diabetes mellitus with other specified complication: Secondary | ICD-10-CM

## 2019-11-11 DIAGNOSIS — J984 Other disorders of lung: Secondary | ICD-10-CM | POA: Diagnosis not present

## 2019-11-11 DIAGNOSIS — Z9889 Other specified postprocedural states: Secondary | ICD-10-CM | POA: Diagnosis not present

## 2019-11-11 DIAGNOSIS — Z7902 Long term (current) use of antithrombotics/antiplatelets: Secondary | ICD-10-CM | POA: Diagnosis not present

## 2019-11-11 DIAGNOSIS — M159 Polyosteoarthritis, unspecified: Secondary | ICD-10-CM | POA: Diagnosis present

## 2019-11-11 DIAGNOSIS — R6510 Systemic inflammatory response syndrome (SIRS) of non-infectious origin without acute organ dysfunction: Secondary | ICD-10-CM | POA: Diagnosis present

## 2019-11-11 DIAGNOSIS — I110 Hypertensive heart disease with heart failure: Secondary | ICD-10-CM | POA: Diagnosis present

## 2019-11-11 DIAGNOSIS — Z888 Allergy status to other drugs, medicaments and biological substances status: Secondary | ICD-10-CM | POA: Diagnosis not present

## 2019-11-11 DIAGNOSIS — E890 Postprocedural hypothyroidism: Secondary | ICD-10-CM | POA: Diagnosis present

## 2019-11-11 DIAGNOSIS — E1165 Type 2 diabetes mellitus with hyperglycemia: Secondary | ICD-10-CM | POA: Diagnosis present

## 2019-11-11 DIAGNOSIS — Z6841 Body Mass Index (BMI) 40.0 and over, adult: Secondary | ICD-10-CM | POA: Diagnosis not present

## 2019-11-11 DIAGNOSIS — I5042 Chronic combined systolic (congestive) and diastolic (congestive) heart failure: Secondary | ICD-10-CM | POA: Diagnosis present

## 2019-11-11 DIAGNOSIS — N179 Acute kidney failure, unspecified: Secondary | ICD-10-CM | POA: Diagnosis not present

## 2019-11-11 DIAGNOSIS — I10 Essential (primary) hypertension: Secondary | ICD-10-CM

## 2019-11-11 DIAGNOSIS — E785 Hyperlipidemia, unspecified: Secondary | ICD-10-CM | POA: Diagnosis present

## 2019-11-11 DIAGNOSIS — Z880 Allergy status to penicillin: Secondary | ICD-10-CM | POA: Diagnosis not present

## 2019-11-11 DIAGNOSIS — D649 Anemia, unspecified: Secondary | ICD-10-CM | POA: Diagnosis present

## 2019-11-11 LAB — CREATININE, SERUM
Creatinine, Ser: 1.19 mg/dL — ABNORMAL HIGH (ref 0.44–1.00)
GFR calc Af Amer: 52 mL/min — ABNORMAL LOW (ref >60)
GFR calc non Af Amer: 45 mL/min — ABNORMAL LOW (ref >60)

## 2019-11-11 LAB — GLUCOSE, CAPILLARY
Glucose-Capillary: 221 mg/dL — ABNORMAL HIGH (ref 70–99)
Glucose-Capillary: 224 mg/dL — ABNORMAL HIGH (ref 70–99)
Glucose-Capillary: 222 mg/dL — ABNORMAL HIGH (ref 70–99)

## 2019-11-11 LAB — CBC
HCT: 31.1 % — ABNORMAL LOW (ref 36.0–46.0)
Hemoglobin: 9.6 g/dL — ABNORMAL LOW (ref 12.0–15.0)
MCH: 30.6 pg (ref 26.0–34.0)
MCHC: 30.9 g/dL (ref 30.0–36.0)
MCV: 99 fL (ref 80.0–100.0)
Platelets: 181 10*3/uL (ref 150–400)
RBC: 3.14 MIL/uL — ABNORMAL LOW (ref 3.87–5.11)
RDW: 14.7 % (ref 11.5–15.5)
WBC: 6.4 10*3/uL (ref 4.0–10.5)
nRBC: 0 % (ref 0.0–0.2)

## 2019-11-11 LAB — CBG MONITORING, ED: Glucose-Capillary: 196 mg/dL — ABNORMAL HIGH (ref 70–99)

## 2019-11-11 LAB — SARS CORONAVIRUS 2 BY RT PCR (HOSPITAL ORDER, PERFORMED IN ~~LOC~~ HOSPITAL LAB): SARS Coronavirus 2: NEGATIVE

## 2019-11-11 LAB — LACTIC ACID, PLASMA: Lactic Acid, Venous: 1.1 mmol/L (ref 0.5–1.9)

## 2019-11-11 MED ORDER — CLOPIDOGREL BISULFATE 75 MG PO TABS
75.0000 mg | ORAL_TABLET | Freq: Every day | ORAL | Status: DC
  Administered 2019-11-11 – 2019-11-13 (×3): 75 mg via ORAL
  Filled 2019-11-11 (×3): qty 1

## 2019-11-11 MED ORDER — IOHEXOL 350 MG/ML SOLN
100.0000 mL | Freq: Once | INTRAVENOUS | Status: AC | PRN
  Administered 2019-11-10: 100 mL via INTRAVENOUS

## 2019-11-11 MED ORDER — ENOXAPARIN SODIUM 60 MG/0.6ML ~~LOC~~ SOLN
50.0000 mg | SUBCUTANEOUS | Status: DC
  Administered 2019-11-11 – 2019-11-13 (×3): 50 mg via SUBCUTANEOUS
  Filled 2019-11-11 (×3): qty 0.6
  Filled 2019-11-11: qty 0.5

## 2019-11-11 MED ORDER — ONDANSETRON HCL 4 MG/2ML IJ SOLN
4.0000 mg | Freq: Four times a day (QID) | INTRAMUSCULAR | Status: DC | PRN
  Administered 2019-11-11: 4 mg via INTRAVENOUS
  Filled 2019-11-11: qty 2

## 2019-11-11 MED ORDER — FENTANYL CITRATE (PF) 100 MCG/2ML IJ SOLN
50.0000 ug | Freq: Once | INTRAMUSCULAR | Status: AC
  Administered 2019-11-11: 50 ug via INTRAVENOUS
  Filled 2019-11-11: qty 2

## 2019-11-11 MED ORDER — ONDANSETRON HCL 4 MG PO TABS: 4.0000 mg | ORAL_TABLET | Freq: Four times a day (QID) | ORAL | Status: DC | PRN

## 2019-11-11 MED ORDER — FERROUS SULFATE 325 (65 FE) MG PO TABS
325.0000 mg | ORAL_TABLET | Freq: Two times a day (BID) | ORAL | Status: DC
  Administered 2019-11-11 – 2019-11-13 (×5): 325 mg via ORAL
  Filled 2019-11-11 (×4): qty 1

## 2019-11-11 MED ORDER — ASPIRIN 81 MG PO CHEW
81.0000 mg | CHEWABLE_TABLET | Freq: Every day | ORAL | Status: DC
  Administered 2019-11-11 – 2019-11-13 (×3): 81 mg via ORAL
  Filled 2019-11-11 (×3): qty 1

## 2019-11-11 MED ORDER — SODIUM CHLORIDE 0.9 % IV SOLN: INTRAVENOUS | Status: AC

## 2019-11-11 MED ORDER — POLYVINYL ALCOHOL 1.4 % OP SOLN
1.0000 [drp] | Freq: Three times a day (TID) | OPHTHALMIC | Status: DC | PRN
  Administered 2019-11-12: 1 [drp] via OPHTHALMIC
  Filled 2019-11-11: qty 15

## 2019-11-11 MED ORDER — ACETAMINOPHEN 325 MG PO TABS
650.0000 mg | ORAL_TABLET | ORAL | Status: DC | PRN
  Administered 2019-11-11 – 2019-11-13 (×3): 650 mg via ORAL
  Filled 2019-11-11 (×3): qty 2

## 2019-11-11 MED ORDER — SODIUM CHLORIDE 0.9 % IV SOLN: Freq: Once | INTRAVENOUS | Status: AC

## 2019-11-11 MED ORDER — GENTEAL TEARS 0.1-0.2-0.3 % OP SOLN: 1.0000 [drp] | Freq: Three times a day (TID) | OPHTHALMIC | Status: DC | PRN

## 2019-11-11 MED ORDER — SIMVASTATIN 20 MG PO TABS
20.0000 mg | ORAL_TABLET | Freq: Every evening | ORAL | Status: DC
  Administered 2019-11-11 – 2019-11-12 (×2): 20 mg via ORAL
  Filled 2019-11-11 (×2): qty 1

## 2019-11-11 MED ORDER — INSULIN ASPART 100 UNIT/ML ~~LOC~~ SOLN
0.0000 [IU] | Freq: Three times a day (TID) | SUBCUTANEOUS | Status: DC
  Administered 2019-11-11: 2 [IU] via SUBCUTANEOUS
  Administered 2019-11-11 (×2): 3 [IU] via SUBCUTANEOUS
  Administered 2019-11-12 (×2): 2 [IU] via SUBCUTANEOUS
  Administered 2019-11-12: 3 [IU] via SUBCUTANEOUS

## 2019-11-11 MED ORDER — METRONIDAZOLE IN NACL 5-0.79 MG/ML-% IV SOLN
500.0000 mg | Freq: Three times a day (TID) | INTRAVENOUS | Status: DC
  Administered 2019-11-11 – 2019-11-13 (×7): 500 mg via INTRAVENOUS
  Filled 2019-11-11 (×7): qty 100

## 2019-11-11 MED ORDER — HYDRALAZINE HCL 20 MG/ML IJ SOLN: 10.0000 mg | INTRAMUSCULAR | Status: DC | PRN

## 2019-11-11 NOTE — ED Notes (Signed)
Verified fentanyl administration with Esbeyde RN d/t scanner not being available

## 2019-11-11 NOTE — ED Notes (Signed)
Tele

## 2019-11-11 NOTE — Progress Notes (Signed)
Patient seen and examined.  Admitted early morning hours by nighttime hospitalist.  Patient came back to emergency room with episode of chills and fever, suprapubic spasm 3 days after undergoing TAVR through the left groin.  Pancultures done.  Admitted and treated as sepsis from unknown source with broad-spectrum antibiotic.  Patient seen and examined.  Daughter was at the bedside.  Continues to have low-grade temperature since morning.  She had mild suprapubic discomfort with no difficulty urination.  No bowel movement since Tuesday morning.  Plan: Suspected vascular access infection with no obvious localizing evidence.  Blood cultures urine culture pending.  Continue broad-spectrum antibiotics for 24 hours, will continue to monitor and de-escalate as more data becomes available. Followed by cardiology.

## 2019-11-11 NOTE — Discharge Instructions (Signed)
ACTIVITY AND EXERCISE °• Daily activity and exercise are an important part of your recovery. People recover at different rates depending on their general health and type of valve procedure. °• Most people recovering from TAVR feel better relatively quickly  °• No lifting, pushing, pulling more than 10 pounds (examples to avoid: groceries, vacuuming, gardening, golfing): °            - For one week with a procedure through the groin. °            - For six weeks for procedures through the chest wall or neck. °NOTE: You will typically see one of our providers 7-14 days after your procedure to discuss WHEN TO RESUME the above activities.  °  °  °DRIVING °• Do not drive until you are seen for follow up and cleared by a provider. Generally, we ask patient to not drive for 1 week after their procedure. °• If you have been told by your doctor in the past that you may not drive, you must talk with him/her before you begin driving again. °  °DRESSING °• Groin site: you may leave the clear dressing over the site for up to one week or until it falls off. °  °HYGIENE °• If you had a femoral (leg) procedure, you may take a shower when you return home. After the shower, pat the site dry. Do NOT use powder, oils or lotions in your groin area until the site has completely healed. °• If you had a chest procedure, you may shower when you return home unless specifically instructed not to by your discharging practitioner. °            - DO NOT scrub incision; pat dry with a towel. °            - DO NOT apply any lotions, oils, powders to the incision. °            - No tub baths / swimming for at least 2 weeks. °• If you notice any fevers, chills, increased pain, swelling, bleeding or pus, please contact your doctor. °  °ADDITIONAL INFORMATION °• If you are going to have an upcoming dental procedure, please contact our office as you will require antibiotics ahead of time to prevent infection on your heart valve.  ° ° °If you have any  questions or concerns you can call the structural heart phone during normal business hours 8am-4pm. If you have an urgent need after hours or weekends please call [phone] to talk to the on call provider for general cardiology. If you have an emergency that requires immediate attention, please call 911.  ° ° °After TAVR Checklist ° °Check  Test Description  ° Follow up appointment in 1-2 weeks  You will see our structural heart physician assistant, Diana Harmon. Your incision sites will be checked and you will be cleared to drive and resume all normal activities if you are doing well.    ° 1 month echo and follow up  You will have an echo to check on your new heart valve and be seen back in the office by Diana Harmon. Many times the echo is not read by your appointment time, but Diana will call you later that day or the following day to report your results.  ° Follow up with your primary cardiologist You will need to be seen by your primary cardiologist in the following 3-6 months after your 1 month appointment in the valve   clinic. Often times your Plavix or Aspirin will be discontinued during this time, but this is decided on a case by case basis.   ° 1 year echo and follow up You will have another echo to check on your heart valve after 1 year and be seen back in the office by Diana Harmon. This your last structural heart visit.  ° Bacterial endocarditis prophylaxis  You will have to take antibiotics for the rest of your life before all dental procedures (even teeth cleanings) to protect your heart valve. Antibiotics are also required before some surgeries. Please check with your cardiologist before scheduling any surgeries. Also, please make sure to tell us if you have a penicillin allergy as you will require an alternative antibiotic.   ° ° °

## 2019-11-11 NOTE — Progress Notes (Addendum)
  [facility]   MULTIDISCIPLINARY HEART VALVE TEAM  Pt well known to our service and was discharged yesterday s/p TAVR. She returned early this AM with fevers and chills as well as suprapubic spasms. Admitted and treated as sepsis from unknown source with broad-spectrum antibiotic. Blood cultures are negative so far. Pre TAVR UA was mildly abnormal but pt did not have symptoms and was not treated. Urine culture here negative. I inspected her vascular access sites which look excellent, doubt vascular infection. Creat with mild increase and white count is normal. Lactic acid normal. Continue work up per primary team.   Angelena Form PA-C  [facility]   I have personally seen and examined this patient. Groins stable. No hematoma. Unclear etiology of fevers. She did receive broad spectrum antibiotics as part of her operative therapy. It is unlikely that she has vascular access site infection. Appreciate care of the primary team. We will be available as needed to assist in her care.   Lauree Chandler 11/11/2019 2:54 PM

## 2019-11-11 NOTE — Consult Note (Signed)
Cardiology Consultation:   Patient ID: Cristian Grieves MRN: [id number]; DOB: [birth year]  Admit date: 11/10/2019 Date of Consult: 11/11/2019  Primary Care Provider: [name], DO [place] Cardiologist: No primary care provider on file. Lauree Chandler [facility] Electrophysiologist:  None   Patient Profile:   74F with HFrEF, HTN, HLD, DM2 and severe AS s/p TAVR (11/09/19) who presents with fevers.   History of Present Illness:   Ms. Beazley was discharged from hospital yesterday post TAVR on 11/09/19 and reported that she felt back to her normal self until around 6 PM yesterday when she developed central nonradiating 8/10 severity abdominal pain along with shaking chills.  She called the nurse coordinator who advised she check a temperature.  Her daughter checked an oral temperature that revealed a fever of 102F. Her daughter reported that she was significantly fatigued and so weak that she could not get up to go to the restroom within only a few hours time.  She did get her food and Ms. Raus tolerated a meal without any nausea, emesis, or worsening abdominal pain.  She denies any urinary frequency, odor, dysuria, urgency, or back pain.  She denies any sick contacts and has had both of her Covid vaccinations.  She denies any cough, shortness of breath, chest pain, chest pressure, diarrhea, or emesis.  Past Medical History:  Diagnosis Date  . Anemia   . Arthritis    legs, knees, back, wrists  . Diabetes mellitus without complication ([place])   . Hypertension   . Nausea   . Patient is [name]   . S/P TAVR (transcatheter aortic valve replacement) 11/09/2019   s/p TAVR with a 26 mm Edwards S3U via the TF approach by Dr. Angelena Form & Cyndia Bent  . Severe aortic stenosis   . Thyroid disease    Past Surgical History:  Procedure Laterality Date  . CARDIAC CATHETERIZATION  10/19/2019  . COLON SURGERY     caudarized polyps; rectal carcinoid excision ~  2011  . COLONOSCOPY    . COLONOSCOPY WITH PROPOFOL N/A 03/13/2015   Procedure: COLONOSCOPY WITH PROPOFOL;  Surgeon: Manya Silvas, MD;  Location: [facility] ENDOSCOPY;  Service: Endoscopy;  Laterality: N/A;  . ESOPHAGOGASTRODUODENOSCOPY N/A 03/13/2015   Procedure: ESOPHAGOGASTRODUODENOSCOPY (EGD);  Surgeon: Manya Silvas, MD;  Location: [facility] ENDOSCOPY;  Service: Endoscopy;  Laterality: N/A;  . INTRAOPERATIVE TRANSTHORACIC ECHOCARDIOGRAM N/A 11/09/2019   Procedure: INTRAOPERATIVE TRANSTHORACIC ECHOCARDIOGRAM;  Surgeon: Burnell Blanks, MD;  Location: [place];  Service: Open Heart Surgery;  Laterality: N/A;  . PARATHYROIDECTOMY  11/20/2004   right superior parathyroidecomy for primary hyperparathyroidism/parathyroid adenoma  . RIGHT/LEFT HEART CATH AND CORONARY ANGIOGRAPHY N/A 10/19/2019   Procedure: RIGHT/LEFT HEART CATH AND CORONARY ANGIOGRAPHY;  Surgeon: Nelva Bush, MD;  Location: [place] CV LAB;  Service: Cardiovascular;  Laterality: N/A;  . THYROIDECTOMY    . TRANSCATHETER AORTIC VALVE REPLACEMENT, TRANSFEMORAL N/A 11/09/2019   Procedure: TRANSCATHETER AORTIC VALVE REPLACEMENT, TRANSFEMORAL;  Surgeon: Burnell Blanks, MD;  Location: [place];  Service: Open Heart Surgery;  Laterality: N/A;    Home Medications:  Prior to Admission medications   Medication Sig Start Date End Date Taking? Authorizing Provider  acetaminophen (TYLENOL) 500 MG tablet Take 1,000 mg by mouth every 6 (six) hours as needed for moderate pain.    Yes [provider]  Artificial Tear Solution (GENTEAL TEARS) 0.1-0.2-0.3 % SOLN Place 1 drop into both eyes 3 (three) times daily as needed (dry/irritated eyes.).   Yes [provider]  aspirin 81 MG chewable tablet Chew 1 tablet (81 mg total) by mouth daily. 11/11/19  Yes Eileen Stanford, PA-C  B COMPLEX-C PO Take 10 mLs by mouth daily.   Yes [provider]  baclofen (LIORESAL) 10 MG tablet TAKE 1 TO 2 TABLETS BY MOUTH TWICE  DAILY AS NEEDED FOR MUSCLE SPASM Patient taking differently: Take 10-20 mg by mouth 2 (two) times daily as needed for muscle spasms.  07/07/19  Yes [name], Devonne Doughty, DO  Cholecalciferol (VITAMIN D3) 125 MCG (5000 UT) TABS Take 5,000 Units by mouth daily.   Yes [provider]  clopidogrel (PLAVIX) 75 MG tablet Take 1 tablet (75 mg total) by mouth daily with breakfast. 11/11/19  Yes Eileen Stanford, PA-C  Ferrous Sulfate (IRON) 325 (65 Fe) MG TABS TAKE 1 TABLET BY MOUTH TWICE DAILY WITH A MEAL Patient taking differently: Take 325 mg by mouth in the morning and at bedtime.  06/05/18  Yes [name], Devonne Doughty, DO  furosemide (LASIX) 20 MG tablet Take 1 tablet (20 mg total) by mouth daily as needed for edema. Patient taking differently: Take 20 mg by mouth daily as needed for edema (fluid retention).  10/01/19 12/30/19 Yes End, Harrell Gave, MD  gabapentin (NEURONTIN) 100 MG capsule Start 1 capsule daily, increase by 1 cap every 2-3 days as tolerated up to 3 times a day, or may take 3 at once in evening. Patient taking differently: Take 100-300 mg by mouth 3 (three) times daily as needed (shingles pain.).  09/14/19  Yes [name], Devonne Doughty, DO  hydrochlorothiazide (HYDRODIURIL) 25 MG tablet Take 1 tablet by mouth once daily Patient taking differently: Take 25 mg by mouth daily.  11/16/18  Yes End, Harrell Gave, MD  lactobacillus acidophilus (BACID) TABS tablet Take 2 tablets by mouth 2 (two) times daily as needed (digestive health.).    Yes [provider]  levocetirizine (XYZAL) 5 MG tablet Take 1 tablet (5 mg total) by mouth every evening. Patient taking differently: Take 5 mg by mouth daily as needed (allergies.).  04/24/17  Yes [name], Devonne Doughty, DO  losartan (COZAAR) 50 MG tablet Take 1 tablet by mouth once daily Patient taking differently: Take 50 mg by mouth daily.  11/07/19  Yes [name], Devonne Doughty, DO  metFORMIN (GLUCOPHAGE) 500 MG tablet Take 1 tablet (500  mg total) by mouth daily with supper. 10/22/19  Yes End, Harrell Gave, MD  mupirocin ointment (BACTROBAN) 2 % Apply 1 application topically 2 (two) times daily. 09/05/19  Yes Melynda Ripple, MD  simvastatin (ZOCOR) 20 MG tablet TAKE 1 TABLET BY MOUTH ONCE DAILY AT 6 PM Patient taking differently: Take 20 mg by mouth every evening.  09/17/19  Yes [name], Devonne Doughty, DO  Blood Glucose Monitoring Suppl (ACCU-CHEK NANO SMARTVIEW) w/Device KIT Use to check blood glucose up to once daily as instructed 08/28/16   [name], DO  glucose blood (ACCU-CHEK SMARTVIEW) test strip Check blood glucose up to once daily as instructed 08/28/16   [name], DO  Cromolyn Sodium (NASAL ALLERGY NA) Place 1 spray into the nose daily as needed (allergies).  09/05/19  [provider]   Inpatient Medications: Scheduled Meds:  Continuous Infusions: . ceFEPime (MAXIPIME) IV Stopped (11/11/19 0037)  . vancomycin Stopped (11/11/19 1610)   PRN Meds:  Allergies:    Allergies  Allergen Reactions  . Ferumoxytol Swelling    Swelling in neck  . Other     Other reaction(s): Other (See Comments) She refuses blood products-Jehovah  witness  . Penicillins Rash, Swelling and Hives   Social History:   Social History   Socioeconomic History  . Marital status: Divorced    Spouse name: Not on file  . Number of children: 2  . Years of education: 45  . Highest education level: 12th grade  Occupational History  . Occupation: retired-worked with nandicapped adults  Tobacco Use  . Smoking status: Former Smoker    Years: 20.00    Types: Cigarettes    Quit date: 09/05/1979    Years since quitting: 40.2  . Smokeless tobacco: Former Network engineer  . Vaping Use: Never used  Substance and Sexual Activity  . Alcohol use: Yes    Alcohol/week: 0.0 - 1.0 standard drinks    Comment: occ. wine  . Drug use: No  . Sexual activity: Not on file  Other Topics Concern  . Not on file  Social  History Narrative  . Not on file   Social Determinants of Health   Financial Resource Strain: Low Risk   . Difficulty of Paying Living Expenses: Not hard at all  Food Insecurity: No Food Insecurity  . Worried About Charity fundraiser in the Last Year: Never true  . Ran Out of Food in the Last Year: Never true  Transportation Needs: No Transportation Needs  . Lack of Transportation (Medical): No  . Lack of Transportation (Non-Medical): No  Physical Activity: Inactive  . Days of Exercise per Week: 0 days  . Minutes of Exercise per Session: 0 min  Stress: No Stress Concern Present  . Feeling of Stress : Not at all  Social Connections: Moderately Integrated  . Frequency of Communication with Friends and Family: More than three times a week  . Frequency of Social Gatherings with Friends and Family: Never  . Attends Religious Services: More than 4 times per year  . Active Member of Clubs or Organizations: Yes  . Attends Archivist Meetings: More than 4 times per year  . Marital Status: Divorced  Human resources officer Violence: Not At Risk  . Fear of Current or Ex-Partner: No  . Emotionally Abused: No  . Physically Abused: No  . Sexually Abused: No    Family History:   No family hx of premature CAD or SCD Family History  Problem Relation Age of Onset  . Parkinson's disease Mother   . Heart disease Father   . Lupus Sister   . Aneurysm Son   . Lupus Son   . Breast cancer Maternal Aunt        mat great aunt    ROS:  Review of Systems: [y] = yes, [ ] = no       General: Weight gain [ ]; Weight loss [ ]; Anorexia [ ]; Fatigue [y]; Fever [y]; Chills [y]; Weakness [y]    Cardiac: Chest pain/pressure [ ]; Resting SOB [ ]; Exertional SOB [ ]; Orthopnea [ ]; Pedal Edema [ ]; Palpitations [ ]; Syncope [ ]; Presyncope [ ]; Paroxysmal nocturnal dyspnea [ ]    Pulmonary: Cough [ ]; Wheezing [ ]; Hemoptysis [ ]; Sputum [ ]; Snoring [ ]    GI: Vomiting [ ]; Dysphagia [ ]; Melena [  ]; Hematochezia [ ]; Heartburn [ ]; Abdominal pain [ ]; Constipation [ ]; Diarrhea [ ]; BRBPR [ ]    GU: Hematuria [ ]; Dysuria [ ]; Nocturia [ ]  Vascular: Pain in legs with walking [ ]; Pain in feet with  lying flat [ ]; Non-healing sores [ ]; Stroke [ ]; TIA [ ]; Slurred speech [ ];    Neuro: Headaches [ ]; Vertigo [ ]; Seizures [ ]; Paresthesias [ ];Blurred vision [ ]; Diplopia [ ]; Vision changes [ ]    Ortho/Skin: Arthritis [ ]; Joint pain [ ]; Muscle pain [ ]; Joint swelling [ ]; Back Pain [ ]; Rash [ ]    Psych: Depression [ ]; Anxiety [ ]    Heme: Bleeding problems [ ]; Clotting disorders [ ]; Anemia [ ]    Endocrine: Diabetes [ ]; Thyroid dysfunction [ ]   Physical Exam/Data:   Vitals:   11/11/19 0035 11/11/19 0130 11/11/19 0215 11/11/19 0315  BP:  (!) 155/69 (!) 149/70 (!) 163/59  Pulse:  (!) 110 (!) 111 (!) 107  Resp:  (!) 24 (!) 24 (!) 24  Temp: (!) 100.7 F (38.2 C)     TempSrc: Oral     SpO2:  95% 99% 99%   No intake or output data in the 24 hours ending 11/11/19 0357 Last 3 Weights 11/10/2019 11/09/2019 11/05/2019  Weight (lbs) 232 lb 2.3 oz 234 lb 5.6 oz 235 lb 8 oz  Weight (kg) 105.3 kg 106.3 kg 106.822 kg     There is no height or weight on file to calculate BMI.  General:  Well nourished, well developed, in no acute distress HEENT: normal Lymph: no adenopathy Neck: no JVD Endocrine:  No thryomegaly Vascular: No carotid bruits; FA pulses 2+ bilaterally without bruits  Cardiac:  normal S1, S2; RRR; no murmur  Lungs:  clear to auscultation bilaterally, no wheezing, rhonchi or rales  Abd: soft, nontender, no hepatomegaly  Ext: no edema Musculoskeletal:  No deformities, BUE and BLE strength normal and equal Skin: warm and dry  Neuro:  CNs 2-12 intact, no focal abnormalities noted Psych:  Normal affect   EKG:  The EKG was personally reviewed and demonstrates: RBBB and sinus tach Telemetry:  Telemetry was personally reviewed and demonstrates: sinus  tach  Relevant CV Studies:  TTE Result date: 11/10/19 1. Day 1 post TAVR, no paravalvular leak. Upper normal transaortic  gradients with peak/mean 37/18 mmHg.  2. Left ventricular ejection fraction, by estimation, is 65 to 70%. The  left ventricle has normal function. The left ventricle has no regional  wall motion abnormalities. Left ventricular diastolic parameters are  consistent with Grade I diastolic dysfunction (impaired relaxation).  3. Right ventricular systolic function is normal. The right ventricular  size is normal. There is normal pulmonary artery systolic pressure. The  estimated right ventricular systolic pressure is 82.7 mmHg.  4. The mitral valve is normal in structure. No evidence of mitral valve  regurgitation. No evidence of mitral stenosis.  5. The aortic valve has been repaired/replaced. Aortic valve  regurgitation is not visualized. No aortic stenosis is present. Procedure  Date: 11/09/2019. Echo findings are consistent with normal structure and  function of the aortic valve prosthesis. Aortic  valve mean gradient measures 18.0 mmHg.  6. The inferior vena cava is normal in size with greater than 50%  respiratory variability, suggesting right atrial pressure of 3 mmHg.   Laboratory Data:  High Sensitivity Troponin:  No results for input(s): TROPONINIHS in the last 720 hours.   Chemistry Recent Labs  Lab 11/05/19 1104 11/09/19 1420 11/09/19 1715 11/10/19 0454 11/10/19 2138  NA 139   < > 141 138 135  K 4.0   < > 4.4 4.5 4.0  CL 106   < > 105 105 103  CO2 23  --   --  23 20*  GLUCOSE 219*   < > 253* 182* 240*  BUN 29*   < > 25* 26* 24*  CREATININE 1.06*   < > 0.80 1.03* 1.20*  CALCIUM 9.7  --   --  9.2 9.3  GFRNONAA 52*  --   --  54* 45*  GFRAA >60  --   --  >60 52*  ANIONGAP 10  --   --  10 12   < > = values in this interval not displayed.    Recent Labs  Lab 11/05/19 1104 11/10/19 2138  PROT 7.7 7.2  ALBUMIN 3.4* 3.1*  AST 61* 45*  ALT  39 33  ALKPHOS 53 54  BILITOT 0.6 0.5   Hematology Recent Labs  Lab 11/05/19 1104 11/09/19 1420 11/09/19 1715 11/10/19 0454 11/10/19 2138  WBC 3.9*  --   --  4.2 6.3  RBC 3.42*  --   --  3.12* 3.33*  HGB 10.5*   < > 10.2* 9.4* 10.1*  HCT 34.3*   < > 30.0* 30.9* 32.2*  MCV 100.3*  --   --  99.0 96.7  MCH 30.7  --   --  30.1 30.3  MCHC 30.6  --   --  30.4 31.4  RDW 14.6  --   --  14.6 14.5  PLT 281  --   --  200 216   < > = values in this interval not displayed.   BNP Recent Labs  Lab 11/05/19 1105  BNP 22.9    DDimer No results for input(s): DDIMER in the last 168 hours.  Radiology/Studies:  DG Chest 2 View  Result Date: 11/10/2019 CLINICAL DATA:  74 year old female with sepsis. EXAM: CHEST - 2 VIEW COMPARISON:  Chest radiograph dated 11/09/2019. FINDINGS: Minimal left lung base atelectasis. Developing infiltrate is less likely. Clinical correlation is recommended. No focal consolidation, pleural effusion, or pneumothorax. The cardiac silhouette is within limits. Aortic valve repair. No acute osseous pathology. IMPRESSION: Minimal left lung base atelectasis. No focal consolidation. Electronically Signed   By: Anner Crete M.D.   On: 11/10/2019 21:48   DG Chest Port 1 View  Result Date: 11/09/2019 CLINICAL DATA:  74 year old female with TAVR. EXAM: PORTABLE CHEST 1 VIEW COMPARISON:  Chest radiograph dated 11/05/2019. FINDINGS: Bibasilar minimal atelectasis. No focal consolidation, pleural effusion, or pneumothorax. Mild cardiomegaly. Aortic valve repair. No acute osseous pathology. IMPRESSION: Status post TAVR.  No acute findings. Electronically Signed   By: Anner Crete M.D.   On: 11/09/2019 19:34   ECHOCARDIOGRAM COMPLETE  Result Date: 11/10/2019    ECHOCARDIOGRAM REPORT   Patient Name:   IYANNAH BLAKE Date of Exam: 11/10/2019 Medical Rec #:  [id number]               Height:       60.0 in Accession #:    [id number]              Weight:       232.1 lb Date of  Birth:  04-Jan-1946               BSA:          1.989 m Patient Age:    35 years                BP:           126/51 mmHg Patient  Gender: F                       HR:           89 bpm. Exam Location:  Inpatient Procedure: 2D Echo Indications:    aortic valve replacement  History:        Patient has prior history of Echocardiogram examinations, most                 recent 11/09/2019. Risk Factors:Hypertension and Diabetes.  Sonographer:    Johny Chess Referring Phys: [id number] [place] R THOMPSON  Sonographer Comments: Patient is morbidly obese. IMPRESSIONS  1. Day 1 post TAVR, no paravalvular leak. Upper normal transaortic gradients with peak/mean 37/18 mmHg.  2. Left ventricular ejection fraction, by estimation, is 65 to 70%. The left ventricle has normal function. The left ventricle has no regional wall motion abnormalities. Left ventricular diastolic parameters are consistent with Grade I diastolic dysfunction (impaired relaxation).  3. Right ventricular systolic function is normal. The right ventricular size is normal. There is normal pulmonary artery systolic pressure. The estimated right ventricular systolic pressure is 53.0 mmHg.  4. The mitral valve is normal in structure. No evidence of mitral valve regurgitation. No evidence of mitral stenosis.  5. The aortic valve has been repaired/replaced. Aortic valve regurgitation is not visualized. No aortic stenosis is present. Procedure Date: 11/09/2019. Echo findings are consistent with normal structure and function of the aortic valve prosthesis. Aortic  valve mean gradient measures 18.0 mmHg.  6. The inferior vena cava is normal in size with greater than 50% respiratory variability, suggesting right atrial pressure of 3 mmHg. FINDINGS  Left Ventricle: Left ventricular ejection fraction, by estimation, is 65 to 70%. The left ventricle has normal function. The left ventricle has no regional wall motion abnormalities. The left ventricular internal cavity size was  normal in size. There is  no left ventricular hypertrophy. Left ventricular diastolic parameters are consistent with Grade I diastolic dysfunction (impaired relaxation). Normal left ventricular filling pressure. Right Ventricle: The right ventricular size is normal. No increase in right ventricular wall thickness. Right ventricular systolic function is normal. There is normal pulmonary artery systolic pressure. The tricuspid regurgitant velocity is 2.51 m/s, and  with an assumed right atrial pressure of 3 mmHg, the estimated right ventricular systolic pressure is 05.1 mmHg. Left Atrium: Left atrial size was normal in size. Right Atrium: Right atrial size was normal in size. Pericardium: There is no evidence of pericardial effusion. Mitral Valve: The mitral valve is normal in structure. Normal mobility of the mitral valve leaflets. No evidence of mitral valve regurgitation. No evidence of mitral valve stenosis. Tricuspid Valve: The tricuspid valve is normal in structure. Tricuspid valve regurgitation is mild . No evidence of tricuspid stenosis. Aortic Valve: The aortic valve has been repaired/replaced. Aortic valve regurgitation is not visualized. No aortic stenosis is present. Aortic valve mean gradient measures 18.0 mmHg. Aortic valve peak gradient measures 37.5 mmHg. Aortic valve area, by VTI measures 2.29 cm. There is a 26 mm Ultra, stented (TAVR) valve present in the aortic position. Echo findings are consistent with normal structure and function of the aortic valve prosthesis. Pulmonic Valve: The pulmonic valve was normal in structure. Pulmonic valve regurgitation is not visualized. No evidence of pulmonic stenosis. Aorta: The aortic root is normal in size and structure. Venous: The inferior vena cava is normal in size with greater than 50% respiratory variability, suggesting right atrial pressure of 3 mmHg.  IAS/Shunts: No atrial level shunt detected by color flow Doppler.  LEFT VENTRICLE PLAX 2D LVIDd:          4.70 cm  Diastology LVIDs:         2.80 cm  LV e' lateral:   8.49 cm/s LV PW:         1.40 cm  LV E/e' lateral: 11.8 LV IVS:        1.10 cm  LV e' medial:    8.92 cm/s LVOT diam:     2.00 cm  LV E/e' medial:  11.2 LV SV:         100 LV SV Index:   50 LVOT Area:     3.14 cm  RIGHT VENTRICLE            IVC RV S prime:     9.68 cm/s  IVC diam: 1.50 cm TAPSE (M-mode): 1.2 cm LEFT ATRIUM             Index       RIGHT ATRIUM          Index LA diam:        3.30 cm 1.66 cm/m  RA Area:     8.50 cm LA Vol (A2C):   38.1 ml 19.16 ml/m RA Volume:   15.10 ml 7.59 ml/m LA Vol (A4C):   33.7 ml 16.95 ml/m LA Biplane Vol: 35.9 ml 18.05 ml/m  AORTIC VALVE AV Area (Vmax):    1.86 cm AV Area (Vmean):   1.85 cm AV Area (VTI):     2.29 cm AV Vmax:           306.00 cm/s AV Vmean:          194.000 cm/s AV VTI:            0.436 m AV Peak Grad:      37.5 mmHg AV Mean Grad:      18.0 mmHg LVOT Vmax:         181.00 cm/s LVOT Vmean:        114.500 cm/s LVOT VTI:          0.318 m LVOT/AV VTI ratio: 0.73  AORTA Ao Root diam: 3.30 cm Ao Asc diam:  3.10 cm MITRAL VALVE                TRICUSPID VALVE MV Area (PHT): 3.46 cm     TR Peak grad:   25.2 mmHg MV Decel Time: 219 msec     TR Vmax:        251.00 cm/s MV E velocity: 100.00 cm/s MV A velocity: 132.00 cm/s  SHUNTS MV E/A ratio:  0.76         Systemic VTI:  0.32 m                             Systemic Diam: 2.00 cm Ena Dawley MD Electronically signed by Ena Dawley MD Signature Date/Time: 11/10/2019/10:28:19 PM    Final    CT Angio Chest/Abd/Pel for Dissection W and/or W/WO  Result Date: 11/11/2019 CLINICAL DATA:  Status post TAVR yesterday with persistent right-sided pain and fevers EXAM: CT ANGIOGRAPHY CHEST, ABDOMEN AND PELVIS TECHNIQUE: Non-contrast CT of the chest was initially obtained. Multidetector CT imaging through the chest, abdomen and pelvis was performed using the standard protocol during bolus administration of intravenous contrast. Multiplanar reconstructed  images and MIPs were obtained and reviewed to evaluate the vascular  anatomy. CONTRAST:  165m OMNIPAQUE IOHEXOL 350 MG/ML SOLN COMPARISON:  Prior chest x-ray from the previous day, CT from 11/02/2019. FINDINGS: CTA CHEST FINDINGS Cardiovascular: Initial precontrast scan demonstrates changes consistent with the recent TAVR. No hyperdense crescent to suggest aortic dissection or acute aortic injury is seen. Atherosclerotic calcifications of the aorta are noted. No aneurysmal dilatation or focal dissection is noted following contrast administration. The pulmonary artery as visualized shows no large central embolus. No significant coronary calcifications are noted. No cardiac enlargement is seen. Mediastinum/Nodes: Thoracic inlet is within normal limits. No sizable hilar or mediastinal adenopathy is noted. The esophagus is within normal limits. Lungs/Pleura: The lungs are well aerated bilaterally. No focal infiltrate or sizable effusion is noted. No sizable parenchymal nodules are seen. Mild subpleural scarring is noted in the left lower lobe. Musculoskeletal: Degenerative changes of the thoracic spine are noted. No acute rib abnormality is seen. Review of the MIP images confirms the above findings. CTA ABDOMEN AND PELVIS FINDINGS VASCULAR Aorta: Atherosclerotic calcifications are noted. No aneurysmal dilatation or dissection is seen. Celiac: Patent without evidence of aneurysm, dissection, vasculitis or significant stenosis. SMA: Patent without evidence of aneurysm, dissection, vasculitis or significant stenosis. Renals: Single renal arteries are noted bilaterally. No focal stenosis is seen. Mild atherosclerotic calcifications are noted. IMA: Patent without evidence of aneurysm, dissection, vasculitis or significant stenosis. Iliacs: Mild atherosclerotic calcifications are noted without aneurysmal dilatation. Postsurgical changes are seen in the inguinal regions bilaterally. Veins: No specific venous abnormality is  noted. Review of the MIP images confirms the above findings. NON-VASCULAR Hepatobiliary: Liver is fatty infiltrated. Gallbladder is within normal limits. Pancreas: Unremarkable. No pancreatic ductal dilatation or surrounding inflammatory changes. Spleen: Normal in size without focal abnormality. Adrenals/Urinary Tract: Adrenal glands are within normal limits. Kidneys demonstrate a normal enhancement pattern. No abnormal mass lesion is seen. The bladder is decompressed. Stomach/Bowel: The appendix is within normal limits. No obstructive or inflammatory changes of the colon are seen. The small bowel is within normal limits. Stomach is unremarkable. Lymphatic: No significant lymphadenopathy is noted. Reproductive: Uterus and bilateral adnexa are unremarkable. Other: No abdominal wall hernia or abnormality. No abdominopelvic ascites. Musculoskeletal: No acute or significant osseous findings. Review of the MIP images confirms the above findings. IMPRESSION: Changes consistent with prior TAVR and bilateral femoral arterial access. No evidence of aortic dissection or aneurysmal dilatation is seen. No retroperitoneal hematoma is noted. Mild subpleural scarring in the left base. No acute abnormality in the abdomen and pelvis is seen. Electronically Signed   By: MInez CatalinaM.D.   On: 11/11/2019 00:43   ECHOCARDIOGRAM LIMITED  Result Date: 11/09/2019    ECHOCARDIOGRAM LIMITED REPORT   Patient Name:   AMELENA HAYESDate of Exam: 11/09/2019 Medical Rec #:  [id number]              Height:       60.0 in Accession #:    [id number]             Weight:       235.5 lb Date of Birth:  [birth year]              BSA:          2.001 m Patient Age:    89years                BP:           170/65 mmHg Patient Gender: F  HR:           104 bpm. Exam Location:  Inpatient Procedure: Limited Echo, Color Doppler and Cardiac Doppler Indications:     Aortic Stenosis i35.0  History:         Patient has prior history  of Echocardiogram examinations, most                  recent 08/20/2019. CHF; Risk Factors:Hypertension, Diabetes and                  Dyslipidemia.  Sonographer:     Raquel Sarna Senior RDCS Referring Phys:  [place] Phys: Ena Dawley MD IMPRESSIONS  1. Limited periprocedural TTE during a TAVR procedure. A 26 mm Edwards-SAPIEN 3 Ultra valve was successfully deployed in the aortic position with improvement of peak/mean transaortic gradients 49/29 mmHg to 6/3 mmHg. There was no paravalvular leak. No pericardial effusion was seen prior and post procedure.  2. Left ventricular ejection fraction, by estimation, is 65 to 70%. The left ventricle has normal function. The left ventricle has no regional wall motion abnormalities. There is mild concentric left ventricular hypertrophy. Left ventricular diastolic function could not be evaluated.  3. Right ventricular systolic function is normal. The right ventricular size is normal.  4. The mitral valve is normal in structure. Mild mitral valve regurgitation. No evidence of mitral stenosis.  5. Tricuspid valve regurgitation is mild to moderate.  6. The aortic valve is normal in structure. Aortic valve regurgitation is not visualized. Moderate to severe aortic valve stenosis. Aortic valve mean gradient measures 29.0 mmHg.  7. The inferior vena cava is normal in size with greater than 50% respiratory variability, suggesting right atrial pressure of 3 mmHg. FINDINGS  Left Ventricle: Left ventricular ejection fraction, by estimation, is 65 to 70%. The left ventricle has normal function. The left ventricle has no regional wall motion abnormalities. The left ventricular internal cavity size was normal in size. There is  mild concentric left ventricular hypertrophy. Right Ventricle: The right ventricular size is normal. No increase in right ventricular wall thickness. Right ventricular systolic function is normal. Left Atrium: Left atrial size was normal in  size. Right Atrium: Right atrial size was normal in size. Pericardium: There is no evidence of pericardial effusion. Mitral Valve: The mitral valve is normal in structure. Normal mobility of the mitral valve leaflets. Mild mitral valve regurgitation. No evidence of mitral valve stenosis. Tricuspid Valve: The tricuspid valve is normal in structure. Tricuspid valve regurgitation is mild to moderate. No evidence of tricuspid stenosis. Aortic Valve: The aortic valve is normal in structure.. There is severe thickening and severe calcifcation of the aortic valve. Aortic valve regurgitation is not visualized. Moderate to severe aortic stenosis is present. There is severe thickening of the  aortic valve. There is severe calcifcation of the aortic valve. Aortic valve mean gradient measures 29.0 mmHg. Aortic valve peak gradient measures 6.2 mmHg. Aortic valve area, by VTI measures 2.89 cm. Pulmonic Valve: The pulmonic valve was normal in structure. Pulmonic valve regurgitation is not visualized. No evidence of pulmonic stenosis. Aorta: The aortic root is normal in size and structure. Venous: The inferior vena cava is normal in size with greater than 50% respiratory variability, suggesting right atrial pressure of 3 mmHg. IAS/Shunts: No atrial level shunt detected by color flow Doppler.  LEFT VENTRICLE PLAX 2D LVOT diam:     2.00 cm LV SV:         80 LV SV  Index:   40 LVOT Area:     3.14 cm  AORTIC VALVE AV Area (Vmax):    2.17 cm AV Area (Vmean):   2.48 cm AV Area (VTI):     2.89 cm AV Vmax:           125.00 cm/s AV Vmean:          73.200 cm/s AV VTI:            0.277 m AV Peak Grad:      6.2 mmHg AV Mean Grad:      29.0 mmHg LVOT Vmax:         86.50 cm/s LVOT Vmean:        57.900 cm/s LVOT VTI:          0.255 m LVOT/AV VTI ratio: 0.92  SHUNTS Systemic VTI:  0.26 m Systemic Diam: 2.00 cm Ena Dawley MD Electronically signed by Ena Dawley MD Signature Date/Time: 11/09/2019/4:50:41 PM    Final    Structural Heart  Procedure  Result Date: 11/09/2019 See surgical note for result.  Assessment and Plan:   73F with HFrEF, HTN, HLD, DM2 and severe AS s/p TAVR (11/09/19) who presents with fevers and VS consistent with sepsis.    1. Fever 2. Sepsis of unknown source 3. AKI Ms. Wing presents with fever, tachycardia, and shaking chills concerning for sepsis.  She has no localizing symptoms other than central nonradiating abdominal pain.  Pristinely her CT abdomen pelvis revealed no inflammatory changes including no ductal dilation pancreas, no distention recommendation gallbladder, normal spleen, normal appendix, and no inflammatory changes of the colon.  There was no evidence of aortic dissection no sign of RP bleed.  She does not have a significant leukocytosis consistent with sepsis and her lactate is normal.  She has no urinary symptoms other than abdominal pain has no localizing symptoms for infection.  Obviously with her recent TAVR skin and soft tissue infection is of concern.  Access for TAVR was via left femoral artery which has no signs of infection surrounding hematoma. - agree with FFWU and broad spectrum abx - agree with IVFs for presumed sepsis, AKI (1.2) from bl (0.8-0.9)   For questions or updates, please contact [place] Please consult [email or web address] for contact info under   Signed, Dion Body, MD  11/11/2019 3:57 AM

## 2019-11-11 NOTE — ED Notes (Signed)
Ordered breakfast--Diana Harmon 

## 2019-11-11 NOTE — Plan of Care (Signed)
New admit from Home, Daughter by bedside. She is requesting to spend the night. I have informed her of visitation hours. Charge and AD made aware.

## 2019-11-11 NOTE — H&P (Addendum)
History and Physical    Kenasia Scheller Higginson TWS:[id number] DOB: 03/17/1946 DOA: 11/10/2019  PCP: [name], DO  Patient coming from: Home.  Chief Complaint: Fever chills.  HPI: Makeba Delcastillo is a 74 y.o. female with history of diabetes mellitus type 2, hypertension, anemia [name] who had TAVR done 3 days ago and had gone home yesterday following which patient slept for 2 hours and woke up with fever chills and periumbilical pain with some nausea.  Has not moved her bowels for last 2 days.  When patient's temperature was checked 102 F.  Patient was instructed to come to the ER.  Denies any chest pain or shortness of breath.  ED Course: The ER patient was febrile with temperature 102 F with labs showing creatinine of 1.2 blood glucose of 240 hemoglobin 10.1 lactic acid was normal.  Patient had a CT angiogram of chest abdomen pelvis which does not show nothing acute.  UA is unremarkable except for WBC of 6-10 with squamous cells.  Covid test was negative.  EKG was showing sinus tachycardia with RBBB.  Cardiology was notified.  Patient was started on empiric antibiotics admitted for further management of SIRS source not clear.  Review of Systems: As per HPI, rest all negative.   Past Medical History:  Diagnosis Date  . Anemia   . Arthritis    legs, knees, back, wrists  . Diabetes mellitus without complication ([place])   . Hypertension   . Nausea   . Patient is [name]   . S/P TAVR (transcatheter aortic valve replacement) 11/09/2019   s/p TAVR with a 26 mm Edwards S3U via the TF approach by Dr. Angelena Form & Cyndia Bent  . Severe aortic stenosis   . Thyroid disease     Past Surgical History:  Procedure Laterality Date  . CARDIAC CATHETERIZATION  10/19/2019  . COLON SURGERY     caudarized polyps; rectal carcinoid excision ~ 2011  . COLONOSCOPY    . COLONOSCOPY WITH PROPOFOL N/A 03/13/2015   Procedure: COLONOSCOPY WITH PROPOFOL;  Surgeon: Manya Silvas, MD;  Location: [facility] ENDOSCOPY;  Service: Endoscopy;  Laterality: N/A;  . ESOPHAGOGASTRODUODENOSCOPY N/A 03/13/2015   Procedure: ESOPHAGOGASTRODUODENOSCOPY (EGD);  Surgeon: Manya Silvas, MD;  Location: [facility] ENDOSCOPY;  Service: Endoscopy;  Laterality: N/A;  . INTRAOPERATIVE TRANSTHORACIC ECHOCARDIOGRAM N/A 11/09/2019   Procedure: INTRAOPERATIVE TRANSTHORACIC ECHOCARDIOGRAM;  Surgeon: Burnell Blanks, MD;  Location: [facility];  Service: Open Heart Surgery;  Laterality: N/A;  . PARATHYROIDECTOMY  11/20/2004   right superior parathyroidecomy for primary hyperparathyroidism/parathyroid adenoma  . RIGHT/LEFT HEART CATH AND CORONARY ANGIOGRAPHY N/A 10/19/2019   Procedure: RIGHT/LEFT HEART CATH AND CORONARY ANGIOGRAPHY;  Surgeon: Nelva Bush, MD;  Location: [facility] LAB;  Service: Cardiovascular;  Laterality: N/A;  . THYROIDECTOMY    . TRANSCATHETER AORTIC VALVE REPLACEMENT, TRANSFEMORAL N/A 11/09/2019   Procedure: TRANSCATHETER AORTIC VALVE REPLACEMENT, TRANSFEMORAL;  Surgeon: Burnell Blanks, MD;  Location: [place];  Service: Open Heart Surgery;  Laterality: N/A;     reports that she quit smoking about 40 years ago. Her smoking use included cigarettes. She quit after 20.00 years of use. She has quit using smokeless tobacco. She reports current alcohol use. She reports that she does not use drugs.  Allergies  Allergen Reactions  . Ferumoxytol Swelling    Swelling in neck  . Other     Other reaction(s): Other (See Comments) She refuses blood products-Jehovah witness  . Penicillins Rash, Swelling and Hives  Family History  Problem Relation Age of Onset  . Parkinson's disease Mother   . Heart disease Father   . Lupus Sister   . Aneurysm Son   . Lupus Son   . Breast cancer Maternal Aunt        mat great aunt    Prior to Admission medications   Medication Sig Start Date End Date Taking? Authorizing Provider  acetaminophen (TYLENOL) 500 MG tablet Take 1,000  mg by mouth every 6 (six) hours as needed for moderate pain.    Yes [provider]  Artificial Tear Solution (GENTEAL TEARS) 0.1-0.2-0.3 % SOLN Place 1 drop into both eyes 3 (three) times daily as needed (dry/irritated eyes.).   Yes [provider]  aspirin 81 MG chewable tablet Chew 1 tablet (81 mg total) by mouth daily. 11/11/19  Yes Eileen Stanford, PA-C  B COMPLEX-C PO Take 10 mLs by mouth daily.   Yes [provider]  baclofen (LIORESAL) 10 MG tablet TAKE 1 TO 2 TABLETS BY MOUTH TWICE DAILY AS NEEDED FOR MUSCLE SPASM Patient taking differently: Take 10-20 mg by mouth 2 (two) times daily as needed for muscle spasms.  07/07/19  Yes [name], Devonne Doughty, DO  Cholecalciferol (VITAMIN D3) 125 MCG (5000 UT) TABS Take 5,000 Units by mouth daily.   Yes [provider]  clopidogrel (PLAVIX) 75 MG tablet Take 1 tablet (75 mg total) by mouth daily with breakfast. 11/11/19  Yes Eileen Stanford, PA-C  Ferrous Sulfate (IRON) 325 (65 Fe) MG TABS TAKE 1 TABLET BY MOUTH TWICE DAILY WITH A MEAL Patient taking differently: Take 325 mg by mouth in the morning and at bedtime.  06/05/18  Yes [name], Devonne Doughty, DO  furosemide (LASIX) 20 MG tablet Take 1 tablet (20 mg total) by mouth daily as needed for edema. Patient taking differently: Take 20 mg by mouth daily as needed for edema (fluid retention).  10/01/19 12/30/19 Yes End, Harrell Gave, MD  gabapentin (NEURONTIN) 100 MG capsule Start 1 capsule daily, increase by 1 cap every 2-3 days as tolerated up to 3 times a day, or may take 3 at once in evening. Patient taking differently: Take 100-300 mg by mouth 3 (three) times daily as needed (shingles pain.).  09/14/19  Yes [name], Devonne Doughty, DO  hydrochlorothiazide (HYDRODIURIL) 25 MG tablet Take 1 tablet by mouth once daily Patient taking differently: Take 25 mg by mouth daily.  11/16/18  Yes End, Harrell Gave, MD  lactobacillus acidophilus (BACID) TABS tablet Take 2  tablets by mouth 2 (two) times daily as needed (digestive health.).    Yes [provider]  levocetirizine (XYZAL) 5 MG tablet Take 1 tablet (5 mg total) by mouth every evening. Patient taking differently: Take 5 mg by mouth daily as needed (allergies.).  04/24/17  Yes [name], Devonne Doughty, DO  losartan (COZAAR) 50 MG tablet Take 1 tablet by mouth once daily Patient taking differently: Take 50 mg by mouth daily.  11/07/19  Yes [name], Devonne Doughty, DO  metFORMIN (GLUCOPHAGE) 500 MG tablet Take 1 tablet (500 mg total) by mouth daily with supper. 10/22/19  Yes End, Harrell Gave, MD  mupirocin ointment (BACTROBAN) 2 % Apply 1 application topically 2 (two) times daily. 09/05/19  Yes Melynda Ripple, MD  simvastatin (ZOCOR) 20 MG tablet TAKE 1 TABLET BY MOUTH ONCE DAILY AT 6 PM Patient taking differently: Take 20 mg by mouth every evening.  09/17/19  Yes [name], Devonne Doughty, DO  Blood Glucose Monitoring Suppl (ACCU-CHEK NANO SMARTVIEW)  w/Device KIT Use to check blood glucose up to once daily as instructed 08/28/16   [name], DO  glucose blood (ACCU-CHEK SMARTVIEW) test strip Check blood glucose up to once daily as instructed 08/28/16   [name], DO  Cromolyn Sodium (NASAL ALLERGY NA) Place 1 spray into the nose daily as needed (allergies).  09/05/19  [provider]    Physical Exam: Constitutional: Moderately built and nourished. Vitals:   11/11/19 0400 11/11/19 0428 11/11/19 0445 11/11/19 0515  BP: (!) 171/72  (!) 148/89 (!) 155/81  Pulse: (!) 105  (!) 111 (!) 111  Resp: (!) 25  20 (!) 23  Temp:  99.8 F (37.7 C)    TempSrc:  Oral    SpO2: 99%  98% 98%   Eyes: Anicteric no pallor. ENMT: No discharge from the ears eyes nose or mouth. Neck: No mass felt.  No neck rigidity. Respiratory: No rhonchi or crepitations. Cardiovascular: S1-S2 heard. Abdomen: Soft nontender bowel sounds present. Musculoskeletal: No edema. Skin: No  rash. Neurologic: Alert awake oriented to time place and person.  Moves all extremities. Psychiatric: Appears normal.  Normal affect.   Labs on Admission: I have personally reviewed following labs and imaging studies  CBC: Recent Labs  Lab 11/05/19 1104 11/09/19 1420 11/09/19 1424 11/09/19 1539 11/09/19 1715 11/10/19 0454 11/10/19 2138  WBC 3.9*  --   --   --   --  4.2 6.3  NEUTROABS  --   --   --   --   --   --  5.2  HGB 10.5*   < > 10.5* 10.5* 10.2* 9.4* 10.1*  HCT 34.3*   < > 31.0* 31.0* 30.0* 30.9* 32.2*  MCV 100.3*  --   --   --   --  99.0 96.7  PLT 281  --   --   --   --  200 216   < > = values in this interval not displayed.   Basic Metabolic Panel: Recent Labs  Lab 11/05/19 1104 11/09/19 1420 11/09/19 1424 11/09/19 1539 11/09/19 1715 11/10/19 0454 11/10/19 2138  NA 139   < > 141 139 141 138 135  K 4.0   < > 3.9 4.3 4.4 4.5 4.0  CL 106  --  103 103 105 105 103  CO2 23  --   --   --   --  23 20*  GLUCOSE 219*  --  198* 251* 253* 182* 240*  BUN 29*  --  25* 25* 25* 26* 24*  CREATININE 1.06*  --  0.80 0.90 0.80 1.03* 1.20*  CALCIUM 9.7  --   --   --   --  9.2 9.3  MG  --   --   --   --   --  1.9  --    < > = values in this interval not displayed.   GFR: Estimated Creatinine Clearance: 45.7 mL/min (A) (by C-G formula based on SCr of 1.2 mg/dL (H)). Liver Function Tests: Recent Labs  Lab 11/05/19 1104 11/10/19 2138  AST 61* 45*  ALT 39 33  ALKPHOS 53 54  BILITOT 0.6 0.5  PROT 7.7 7.2  ALBUMIN 3.4* 3.1*   No results for input(s): LIPASE, AMYLASE in the last 168 hours. No results for input(s): AMMONIA in the last 168 hours. Coagulation Profile: Recent Labs  Lab 11/05/19 1104 11/10/19 2138  INR 1.1 1.1   Cardiac Enzymes: No results for input(s): CKTOTAL, CKMB, CKMBINDEX, TROPONINI in the  last 168 hours. BNP (last 3 results) No results for input(s): PROBNP in the last 8760 hours. HbA1C: No results for input(s): HGBA1C in the last 72  hours. CBG: Recent Labs  Lab 11/09/19 1323 11/09/19 1853 11/09/19 2110 11/10/19 0645 11/10/19 1106  GLUCAP 181* 207* 244* 161* 217*   Lipid Profile: No results for input(s): CHOL, HDL, LDLCALC, TRIG, CHOLHDL, LDLDIRECT in the last 72 hours. Thyroid Function Tests: No results for input(s): TSH, T4TOTAL, FREET4, T3FREE, THYROIDAB in the last 72 hours. Anemia Panel: No results for input(s): VITAMINB12, FOLATE, FERRITIN, TIBC, IRON, RETICCTPCT in the last 72 hours. Urine analysis:    Component Value Date/Time   COLORURINE YELLOW 11/10/2019 2126   APPEARANCEUR CLOUDY (A) 11/10/2019 2126   LABSPEC 1.020 11/10/2019 2126   PHURINE 5.0 11/10/2019 2126   GLUCOSEU NEGATIVE 11/10/2019 2126   HGBUR SMALL (A) 11/10/2019 2126   BILIRUBINUR NEGATIVE 11/10/2019 2126   KETONESUR NEGATIVE 11/10/2019 2126   PROTEINUR NEGATIVE 11/10/2019 2126   NITRITE NEGATIVE 11/10/2019 2126   LEUKOCYTESUR NEGATIVE 11/10/2019 2126   Sepsis Labs: '@LABRCNTIP' (procalcitonin:4,lacticidven:4) ) Recent Results (from the past 240 hour(s))  Surgical pcr screen     Status: None   Collection Time: 11/05/19 11:08 AM   Specimen: Nasal Mucosa; Nasal Swab  Result Value Ref Range Status   MRSA, PCR NEGATIVE NEGATIVE Final   Staphylococcus aureus NEGATIVE NEGATIVE Final    Comment: (NOTE) The Xpert SA Assay (FDA approved for NASAL specimens in patients 70 years of age and older), is one component of a comprehensive surveillance program. It is not intended to diagnose infection nor to guide or monitor treatment. Performed at [facility], [place] [address], [place], [place] [address]   SARS CORONAVIRUS 2 (TAT 6-24 HRS) Nasopharyngeal Nasopharyngeal Swab     Status: None   Collection Time: 11/06/19 11:05 AM   Specimen: Nasopharyngeal Swab  Result Value Ref Range Status   SARS Coronavirus 2 NEGATIVE NEGATIVE Final    Comment: (NOTE) SARS-CoV-2 target nucleic acids are NOT DETECTED.  The SARS-CoV-2 RNA is  generally detectable in upper and lower respiratory specimens during the acute phase of infection. Negative results do not preclude SARS-CoV-2 infection, do not rule out co-infections with other pathogens, and should not be used as the sole basis for treatment or other patient management decisions. Negative results must be combined with clinical observations, patient history, and epidemiological information. The expected result is Negative.  Fact Sheet for Patients: SugarRoll.be  Fact Sheet for Healthcare Providers: https://www.woods-mathews.com/  This test is not yet approved or cleared by the [place] FDA and  has been authorized for detection and/or diagnosis of SARS-CoV-2 by FDA under an Emergency Use Authorization (EUA). This EUA will remain  in effect (meaning this test can be used) for the duration of the COVID-19 declaration under Se ction 564(b)(1) of the Act, 21 U.S.C. section 360bbb-3(b)(1), unless the authorization is terminated or revoked sooner.  Performed at [facility], [place] [address], [place], [place] [address]   SARS Coronavirus 2 by RT PCR (hospital order, performed in [facility] hospital lab) Nasopharyngeal Nasopharyngeal Swab     Status: None   Collection Time: 11/10/19 11:00 PM   Specimen: Nasopharyngeal Swab  Result Value Ref Range Status   SARS Coronavirus 2 NEGATIVE NEGATIVE Final    Comment: (NOTE) SARS-CoV-2 target nucleic acids are NOT DETECTED.  The SARS-CoV-2 RNA is generally detectable in upper and lower respiratory specimens during the acute phase of infection. The lowest  concentration of SARS-CoV-2 viral copies this assay can detect is 250 copies / mL. A negative result does not preclude SARS-CoV-2 infection and should not be used as the sole basis for treatment or other patient management decisions.  A negative result may occur with improper specimen collection / handling, submission of  specimen other than nasopharyngeal swab, presence of viral mutation(s) within the areas targeted by this assay, and inadequate number of viral copies (<250 copies / mL). A negative result must be combined with clinical observations, patient history, and epidemiological information.  Fact Sheet for Patients:   StrictlyIdeas.no  Fact Sheet for Healthcare Providers: BankingDealers.co.za  This test is not yet approved or  cleared by the [place] FDA and has been authorized for detection and/or diagnosis of SARS-CoV-2 by FDA under an Emergency Use Authorization (EUA).  This EUA will remain in effect (meaning this test can be used) for the duration of the COVID-19 declaration under Section 564(b)(1) of the Act, 21 U.S.C. section 360bbb-3(b)(1), unless the authorization is terminated or revoked sooner.  Performed at [facility], [place] [address] [place], [place] [address]      Radiological Exams on Admission: DG Chest 2 View  Result Date: 11/10/2019 CLINICAL DATA:  74 year old female with sepsis. EXAM: CHEST - 2 VIEW COMPARISON:  Chest radiograph dated 11/09/2019. FINDINGS: Minimal left lung base atelectasis. Developing infiltrate is less likely. Clinical correlation is recommended. No focal consolidation, pleural effusion, or pneumothorax. The cardiac silhouette is within limits. Aortic valve repair. No acute osseous pathology. IMPRESSION: Minimal left lung base atelectasis. No focal consolidation. Electronically Signed   By: Anner Crete M.D.   On: 11/10/2019 21:48   DG Chest Port 1 View  Result Date: 11/09/2019 CLINICAL DATA:  74 year old female with TAVR. EXAM: PORTABLE CHEST 1 VIEW COMPARISON:  Chest radiograph dated 11/05/2019. FINDINGS: Bibasilar minimal atelectasis. No focal consolidation, pleural effusion, or pneumothorax. Mild cardiomegaly. Aortic valve repair. No acute osseous pathology. IMPRESSION: Status post TAVR.  No  acute findings. Electronically Signed   By: Anner Crete M.D.   On: 11/09/2019 19:34   ECHOCARDIOGRAM COMPLETE  Result Date: 11/10/2019    ECHOCARDIOGRAM REPORT   Patient Name:   AVANI SENSABAUGH Date of Exam: 11/10/2019 Medical Rec #:  [id number]               Height:       60.0 in Accession #:    [id number]              Weight:       232.1 lb Date of Birth:  [birth year]               BSA:          1.989 m Patient Age:    74 years                BP:           126/51 mmHg Patient Gender: F                       HR:           89 bpm. Exam Location:  Inpatient Procedure: 2D Echo Indications:    aortic valve replacement  History:        Patient has prior history of Echocardiogram examinations, most                 recent 11/09/2019. Risk Factors:Hypertension and Diabetes.  Sonographer:  Johny Chess Referring Phys: [id number] Eileen Stanford  Sonographer Comments: Patient is morbidly obese. IMPRESSIONS  1. Day 1 post TAVR, no paravalvular leak. Upper normal transaortic gradients with peak/mean 37/18 mmHg.  2. Left ventricular ejection fraction, by estimation, is 65 to 70%. The left ventricle has normal function. The left ventricle has no regional wall motion abnormalities. Left ventricular diastolic parameters are consistent with Grade I diastolic dysfunction (impaired relaxation).  3. Right ventricular systolic function is normal. The right ventricular size is normal. There is normal pulmonary artery systolic pressure. The estimated right ventricular systolic pressure is 38.1 mmHg.  4. The mitral valve is normal in structure. No evidence of mitral valve regurgitation. No evidence of mitral stenosis.  5. The aortic valve has been repaired/replaced. Aortic valve regurgitation is not visualized. No aortic stenosis is present. Procedure Date: 11/09/2019. Echo findings are consistent with normal structure and function of the aortic valve prosthesis. Aortic  valve mean gradient measures 18.0 mmHg.  6. The  inferior vena cava is normal in size with greater than 50% respiratory variability, suggesting right atrial pressure of 3 mmHg. FINDINGS  Left Ventricle: Left ventricular ejection fraction, by estimation, is 65 to 70%. The left ventricle has normal function. The left ventricle has no regional wall motion abnormalities. The left ventricular internal cavity size was normal in size. There is  no left ventricular hypertrophy. Left ventricular diastolic parameters are consistent with Grade I diastolic dysfunction (impaired relaxation). Normal left ventricular filling pressure. Right Ventricle: The right ventricular size is normal. No increase in right ventricular wall thickness. Right ventricular systolic function is normal. There is normal pulmonary artery systolic pressure. The tricuspid regurgitant velocity is 2.51 m/s, and  with an assumed right atrial pressure of 3 mmHg, the estimated right ventricular systolic pressure is 01.7 mmHg. Left Atrium: Left atrial size was normal in size. Right Atrium: Right atrial size was normal in size. Pericardium: There is no evidence of pericardial effusion. Mitral Valve: The mitral valve is normal in structure. Normal mobility of the mitral valve leaflets. No evidence of mitral valve regurgitation. No evidence of mitral valve stenosis. Tricuspid Valve: The tricuspid valve is normal in structure. Tricuspid valve regurgitation is mild . No evidence of tricuspid stenosis. Aortic Valve: The aortic valve has been repaired/replaced. Aortic valve regurgitation is not visualized. No aortic stenosis is present. Aortic valve mean gradient measures 18.0 mmHg. Aortic valve peak gradient measures 37.5 mmHg. Aortic valve area, by VTI measures 2.29 cm. There is a 26 mm Ultra, stented (TAVR) valve present in the aortic position. Echo findings are consistent with normal structure and function of the aortic valve prosthesis. Pulmonic Valve: The pulmonic valve was normal in structure. Pulmonic valve  regurgitation is not visualized. No evidence of pulmonic stenosis. Aorta: The aortic root is normal in size and structure. Venous: The inferior vena cava is normal in size with greater than 50% respiratory variability, suggesting right atrial pressure of 3 mmHg. IAS/Shunts: No atrial level shunt detected by color flow Doppler.  LEFT VENTRICLE PLAX 2D LVIDd:         4.70 cm  Diastology LVIDs:         2.80 cm  LV e' lateral:   8.49 cm/s LV PW:         1.40 cm  LV E/e' lateral: 11.8 LV IVS:        1.10 cm  LV e' medial:    8.92 cm/s LVOT diam:     2.00 cm  LV  E/e' medial:  11.2 LV SV:         100 LV SV Index:   50 LVOT Area:     3.14 cm  RIGHT VENTRICLE            IVC RV S prime:     9.68 cm/s  IVC diam: 1.50 cm TAPSE (M-mode): 1.2 cm LEFT ATRIUM             Index       RIGHT ATRIUM          Index LA diam:        3.30 cm 1.66 cm/m  RA Area:     8.50 cm LA Vol (A2C):   38.1 ml 19.16 ml/m RA Volume:   15.10 ml 7.59 ml/m LA Vol (A4C):   33.7 ml 16.95 ml/m LA Biplane Vol: 35.9 ml 18.05 ml/m  AORTIC VALVE AV Area (Vmax):    1.86 cm AV Area (Vmean):   1.85 cm AV Area (VTI):     2.29 cm AV Vmax:           306.00 cm/s AV Vmean:          194.000 cm/s AV VTI:            0.436 m AV Peak Grad:      37.5 mmHg AV Mean Grad:      18.0 mmHg LVOT Vmax:         181.00 cm/s LVOT Vmean:        114.500 cm/s LVOT VTI:          0.318 m LVOT/AV VTI ratio: 0.73  AORTA Ao Root diam: 3.30 cm Ao Asc diam:  3.10 cm MITRAL VALVE                TRICUSPID VALVE MV Area (PHT): 3.46 cm     TR Peak grad:   25.2 mmHg MV Decel Time: 219 msec     TR Vmax:        251.00 cm/s MV E velocity: 100.00 cm/s MV A velocity: 132.00 cm/s  SHUNTS MV E/A ratio:  0.76         Systemic VTI:  0.32 m                             Systemic Diam: 2.00 cm Ena Dawley MD Electronically signed by Ena Dawley MD Signature Date/Time: 11/10/2019/10:28:19 PM    Final    CT Angio Chest/Abd/Pel for Dissection W and/or W/WO  Result Date: 11/11/2019 CLINICAL DATA:   Status post TAVR yesterday with persistent right-sided pain and fevers EXAM: CT ANGIOGRAPHY CHEST, ABDOMEN AND PELVIS TECHNIQUE: Non-contrast CT of the chest was initially obtained. Multidetector CT imaging through the chest, abdomen and pelvis was performed using the standard protocol during bolus administration of intravenous contrast. Multiplanar reconstructed images and MIPs were obtained and reviewed to evaluate the vascular anatomy. CONTRAST:  159m OMNIPAQUE IOHEXOL 350 MG/ML SOLN COMPARISON:  Prior chest x-ray from the previous day, CT from 11/02/2019. FINDINGS: CTA CHEST FINDINGS Cardiovascular: Initial precontrast scan demonstrates changes consistent with the recent TAVR. No hyperdense crescent to suggest aortic dissection or acute aortic injury is seen. Atherosclerotic calcifications of the aorta are noted. No aneurysmal dilatation or focal dissection is noted following contrast administration. The pulmonary artery as visualized shows no large central embolus. No significant coronary calcifications are noted. No cardiac enlargement is seen. Mediastinum/Nodes: Thoracic inlet is within normal limits.  No sizable hilar or mediastinal adenopathy is noted. The esophagus is within normal limits. Lungs/Pleura: The lungs are well aerated bilaterally. No focal infiltrate or sizable effusion is noted. No sizable parenchymal nodules are seen. Mild subpleural scarring is noted in the left lower lobe. Musculoskeletal: Degenerative changes of the thoracic spine are noted. No acute rib abnormality is seen. Review of the MIP images confirms the above findings. CTA ABDOMEN AND PELVIS FINDINGS VASCULAR Aorta: Atherosclerotic calcifications are noted. No aneurysmal dilatation or dissection is seen. Celiac: Patent without evidence of aneurysm, dissection, vasculitis or significant stenosis. SMA: Patent without evidence of aneurysm, dissection, vasculitis or significant stenosis. Renals: Single renal arteries are noted  bilaterally. No focal stenosis is seen. Mild atherosclerotic calcifications are noted. IMA: Patent without evidence of aneurysm, dissection, vasculitis or significant stenosis. Iliacs: Mild atherosclerotic calcifications are noted without aneurysmal dilatation. Postsurgical changes are seen in the inguinal regions bilaterally. Veins: No specific venous abnormality is noted. Review of the MIP images confirms the above findings. NON-VASCULAR Hepatobiliary: Liver is fatty infiltrated. Gallbladder is within normal limits. Pancreas: Unremarkable. No pancreatic ductal dilatation or surrounding inflammatory changes. Spleen: Normal in size without focal abnormality. Adrenals/Urinary Tract: Adrenal glands are within normal limits. Kidneys demonstrate a normal enhancement pattern. No abnormal mass lesion is seen. The bladder is decompressed. Stomach/Bowel: The appendix is within normal limits. No obstructive or inflammatory changes of the colon are seen. The small bowel is within normal limits. Stomach is unremarkable. Lymphatic: No significant lymphadenopathy is noted. Reproductive: Uterus and bilateral adnexa are unremarkable. Other: No abdominal wall hernia or abnormality. No abdominopelvic ascites. Musculoskeletal: No acute or significant osseous findings. Review of the MIP images confirms the above findings. IMPRESSION: Changes consistent with prior TAVR and bilateral femoral arterial access. No evidence of aortic dissection or aneurysmal dilatation is seen. No retroperitoneal hematoma is noted. Mild subpleural scarring in the left base. No acute abnormality in the abdomen and pelvis is seen. Electronically Signed   By: Inez Catalina M.D.   On: 11/11/2019 00:43   ECHOCARDIOGRAM LIMITED  Result Date: 11/09/2019    ECHOCARDIOGRAM LIMITED REPORT   Patient Name:   ZAN TRISKA Date of Exam: 11/09/2019 Medical Rec #:  [id number]               Height:       60.0 in Accession #:    [id number]              Weight:        235.5 lb Date of Birth:  Jul 25, 1945               BSA:          2.001 m Patient Age:    42 years                BP:           170/65 mmHg Patient Gender: F                       HR:           104 bpm. Exam Location:  Inpatient Procedure: Limited Echo, Color Doppler and Cardiac Doppler Indications:     Aortic Stenosis i35.0  History:         Patient has prior history of Echocardiogram examinations, most                  recent 08/20/2019. CHF; Risk Factors:Hypertension, Diabetes and  Dyslipidemia.  Sonographer:     Raquel Sarna Senior RDCS Referring Phys:  [place] Phys: Ena Dawley MD IMPRESSIONS  1. Limited periprocedural TTE during a TAVR procedure. A 26 mm Edwards-SAPIEN 3 Ultra valve was successfully deployed in the aortic position with improvement of peak/mean transaortic gradients 49/29 mmHg to 6/3 mmHg. There was no paravalvular leak. No pericardial effusion was seen prior and post procedure.  2. Left ventricular ejection fraction, by estimation, is 65 to 70%. The left ventricle has normal function. The left ventricle has no regional wall motion abnormalities. There is mild concentric left ventricular hypertrophy. Left ventricular diastolic function could not be evaluated.  3. Right ventricular systolic function is normal. The right ventricular size is normal.  4. The mitral valve is normal in structure. Mild mitral valve regurgitation. No evidence of mitral stenosis.  5. Tricuspid valve regurgitation is mild to moderate.  6. The aortic valve is normal in structure. Aortic valve regurgitation is not visualized. Moderate to severe aortic valve stenosis. Aortic valve mean gradient measures 29.0 mmHg.  7. The inferior vena cava is normal in size with greater than 50% respiratory variability, suggesting right atrial pressure of 3 mmHg. FINDINGS  Left Ventricle: Left ventricular ejection fraction, by estimation, is 65 to 70%. The left ventricle has normal function. The  left ventricle has no regional wall motion abnormalities. The left ventricular internal cavity size was normal in size. There is  mild concentric left ventricular hypertrophy. Right Ventricle: The right ventricular size is normal. No increase in right ventricular wall thickness. Right ventricular systolic function is normal. Left Atrium: Left atrial size was normal in size. Right Atrium: Right atrial size was normal in size. Pericardium: There is no evidence of pericardial effusion. Mitral Valve: The mitral valve is normal in structure. Normal mobility of the mitral valve leaflets. Mild mitral valve regurgitation. No evidence of mitral valve stenosis. Tricuspid Valve: The tricuspid valve is normal in structure. Tricuspid valve regurgitation is mild to moderate. No evidence of tricuspid stenosis. Aortic Valve: The aortic valve is normal in structure.. There is severe thickening and severe calcifcation of the aortic valve. Aortic valve regurgitation is not visualized. Moderate to severe aortic stenosis is present. There is severe thickening of the  aortic valve. There is severe calcifcation of the aortic valve. Aortic valve mean gradient measures 29.0 mmHg. Aortic valve peak gradient measures 6.2 mmHg. Aortic valve area, by VTI measures 2.89 cm. Pulmonic Valve: The pulmonic valve was normal in structure. Pulmonic valve regurgitation is not visualized. No evidence of pulmonic stenosis. Aorta: The aortic root is normal in size and structure. Venous: The inferior vena cava is normal in size with greater than 50% respiratory variability, suggesting right atrial pressure of 3 mmHg. IAS/Shunts: No atrial level shunt detected by color flow Doppler.  LEFT VENTRICLE PLAX 2D LVOT diam:     2.00 cm LV SV:         80 LV SV Index:   40 LVOT Area:     3.14 cm  AORTIC VALVE AV Area (Vmax):    2.17 cm AV Area (Vmean):   2.48 cm AV Area (VTI):     2.89 cm AV Vmax:           125.00 cm/s AV Vmean:          73.200 cm/s AV VTI:             0.277 m AV Peak Grad:      6.2 mmHg AV  Mean Grad:      29.0 mmHg LVOT Vmax:         86.50 cm/s LVOT Vmean:        57.900 cm/s LVOT VTI:          0.255 m LVOT/AV VTI ratio: 0.92  SHUNTS Systemic VTI:  0.26 m Systemic Diam: 2.00 cm Ena Dawley MD Electronically signed by Ena Dawley MD Signature Date/Time: 11/09/2019/4:50:41 PM    Final    Structural Heart Procedure  Result Date: 11/09/2019 See surgical note for result.   EKG: Independently reviewed.  Sinus tachycardia.  Assessment/Plan Principal Problem:   SIRS (systemic inflammatory response syndrome) (HCC) Active Problems:   Type 2 diabetes mellitus with other specified complication (HCC)   Essential hypertension   Chronic heart failure with preserved ejection fraction (HFpEF) (HCC)   Hypertension   S/P TAVR (transcatheter aortic valve replacement)    1. SIRS source not clear patient just had TAVR done.  Cardiology was notified.  Appreciate cardiology consult.  Presently on empiric antibiotics follow cultures continue hydration. 2. Acute renal failure -cause not clear.  Will hold off ARB and hydrochlorothiazide gently hydrate follow metabolic panel. 3. Hypertension holding ARB and hydrochlorothiazide due to acute renal failure we will keep patient on as needed IV hydralazine. 4. Diabetes mellitus type 2 we will keep patient on sliding scale coverage.  Follow metabolic panel closely. 5. Anemia appears to be chronic follow CBC.  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 6. Status post TAVR appreciate cardiology input.  Follow cultures.  Since patient has SIRS which will need more than 2 midnight stay to monitor for any further deterioration in inpatient status.   DVT prophylaxis: Lovenox. Code Status: Full code. Family Communication: Patient's daughter. Disposition Plan: Home when stable. Consults called: Cardiology. Admission status: Inpatient.   Rise Patience MD [facility] Pager [phone].  If  7PM-7AM, please contact night-coverage [email or web address] Password [facility]  11/11/2019, 5:42 AM

## 2019-11-12 ENCOUNTER — Ambulatory Visit: Payer: Medicare Other

## 2019-11-12 LAB — CBC WITH DIFFERENTIAL/PLATELET
Abs Immature Granulocytes: 0.03 10*3/uL (ref 0.00–0.07)
Basophils Absolute: 0 10*3/uL (ref 0.0–0.1)
Basophils Relative: 0 %
Eosinophils Absolute: 0 10*3/uL (ref 0.0–0.5)
Eosinophils Relative: 0 %
HCT: 28.1 % — ABNORMAL LOW (ref 36.0–46.0)
Hemoglobin: 8.7 g/dL — ABNORMAL LOW (ref 12.0–15.0)
Immature Granulocytes: 1 %
Lymphocytes Relative: 27 %
Lymphs Abs: 1.3 10*3/uL (ref 0.7–4.0)
MCH: 30.1 pg (ref 26.0–34.0)
MCHC: 31 g/dL (ref 30.0–36.0)
MCV: 97.2 fL (ref 80.0–100.0)
Monocytes Absolute: 0.6 10*3/uL (ref 0.1–1.0)
Monocytes Relative: 11 %
Neutro Abs: 3 10*3/uL (ref 1.7–7.7)
Neutrophils Relative %: 61 %
Platelets: 164 10*3/uL (ref 150–400)
RBC: 2.89 MIL/uL — ABNORMAL LOW (ref 3.87–5.11)
RDW: 14.6 % (ref 11.5–15.5)
WBC: 5 10*3/uL (ref 4.0–10.5)
nRBC: 0 % (ref 0.0–0.2)

## 2019-11-12 LAB — URINE CULTURE: Culture: NO GROWTH

## 2019-11-12 LAB — GLUCOSE, CAPILLARY
Glucose-Capillary: 182 mg/dL — ABNORMAL HIGH (ref 70–99)
Glucose-Capillary: 163 mg/dL — ABNORMAL HIGH (ref 70–99)
Glucose-Capillary: 240 mg/dL — ABNORMAL HIGH (ref 70–99)
Glucose-Capillary: 197 mg/dL — ABNORMAL HIGH (ref 70–99)

## 2019-11-12 LAB — COMPREHENSIVE METABOLIC PANEL
ALT: 29 U/L (ref 0–44)
AST: 34 U/L (ref 15–41)
Albumin: 2.5 g/dL — ABNORMAL LOW (ref 3.5–5.0)
Alkaline Phosphatase: 43 U/L (ref 38–126)
Anion gap: 8 (ref 5–15)
BUN: 21 mg/dL (ref 8–23)
CO2: 20 mmol/L — ABNORMAL LOW (ref 22–32)
Calcium: 8.4 mg/dL — ABNORMAL LOW (ref 8.9–10.3)
Chloride: 108 mmol/L (ref 98–111)
Creatinine, Ser: 1.01 mg/dL — ABNORMAL HIGH (ref 0.44–1.00)
GFR calc Af Amer: >60 mL/min (ref >60)
GFR calc non Af Amer: 55 mL/min — ABNORMAL LOW (ref >60)
Glucose, Bld: 202 mg/dL — ABNORMAL HIGH (ref 70–99)
Potassium: 4.3 mmol/L (ref 3.5–5.1)
Sodium: 136 mmol/L (ref 135–145)
Total Bilirubin: 0.6 mg/dL (ref 0.3–1.2)
Total Protein: 6.3 g/dL — ABNORMAL LOW (ref 6.5–8.1)

## 2019-11-12 LAB — PHOSPHORUS: Phosphorus: 2.5 mg/dL (ref 2.5–4.6)

## 2019-11-12 LAB — MAGNESIUM: Magnesium: 2 mg/dL (ref 1.7–2.4)

## 2019-11-12 NOTE — Progress Notes (Signed)
PROGRESS NOTE    Solaris Kram Clinch  XHB:[id number] DOB: 1946-02-27 DOA: 11/10/2019 PCP: [name], DO    Brief Narrative:  74 year old female with history of type 2 diabetes on insulin, anemia, history of TAVR done 3 days ago prior to arrival, discharged home in the morning of 11/10/2019 presented back to the emergency room by evening with fever chills and periumbilical pain with some nausea.  In the emergency room febrile with temperature 102.  Creatinine 1.2.  Lactic acid normal.  CT angiogram of the chest abdomen pelvis was nonacute.  UA unremarkable.  Covid test negative.  Patient was admitted to the hospital with fever of unknown source.   Assessment & Plan:   Principal Problem:   SIRS (systemic inflammatory response syndrome) (HCC) Active Problems:   Type 2 diabetes mellitus with other specified complication (HCC)   Essential hypertension   Chronic heart failure with preserved ejection fraction (HFpEF) (HCC)   Hypertension   S/P TAVR (transcatheter aortic valve replacement)  SIRS: Primary source unknown.  Blood cultures and urine culture no growth so far.  No clinically localizing infection.  Vascular access site is clean and nontender.  Imaging studies of the chest abdomen pelvis with no acute findings. Treated with broad-spectrum antibiotics with vancomycin cefepime and Flagyl Clinically improving, MRSA swab negative, will discontinue vancomycin today.  Continue cefepime and Flagyl today.  Mobilize. Followed by cardiology.  Diabetes type 2 with hyperglycemia: Remains on insulin and adequately controlled.  Hypertension: Blood pressure stable.  ARB and hydrochlorothiazide on hold.  Acute renal failure: Due to #1.  Stabilizing.  Status post TAVR: Followed by cardiology.  Surgically stable as per them.   DVT prophylaxis: Lovenox subcu   Code Status: Full code Family Communication: Daughter at the bedside Disposition Plan: Status is: Inpatient  Remains  inpatient appropriate because:IV treatments appropriate due to intensity of illness or inability to take PO   Dispo:  Patient From: Home  Planned Disposition: Home  Expected discharge date: 11/13/19  Medically stable for discharge: No          Consultants:   Cardiology  Procedures:   None  Antimicrobials:  Anti-infectives (From admission, onward)   Start     Dose/Rate Route Frequency Ordered Stop   11/11/19 0800  metroNIDAZOLE (FLAGYL) IVPB 500 mg     Discontinue     500 mg 100 mL/hr over 60 Minutes Intravenous Every 8 hours 11/11/19 0541     11/11/19 0000  vancomycin (VANCOREADY) IVPB 1250 mg/250 mL  Status:  Discontinued        1,250 mg 166.7 mL/hr over 90 Minutes Intravenous Every 24 hours 11/10/19 2254 11/12/19 1048   11/10/19 2300  metroNIDAZOLE (FLAGYL) IVPB 500 mg        500 mg 100 mL/hr over 60 Minutes Intravenous  Once 11/10/19 2248 11/11/19 0037   11/10/19 2300  ceFEPIme (MAXIPIME) 2 g in sodium chloride 0.9 % 100 mL IVPB     Discontinue     2 g 200 mL/hr over 30 Minutes Intravenous Every 12 hours 11/10/19 2254           Subjective: Patient seen and examined.  No overnight events.  Temperature maximum 102 last 24 hours.  Today, she has less symptoms.  She has no more abdominal pain or spasms.  Urinating without trouble.  She had bowel movements overnight without any issues.  Denies any nausea vomiting.  Wants to mobilize today.  Objective: Vitals:   11/11/19 1503 11/11/19 1625 11/11/19  2233 11/12/19 0737  BP:  (!) 142/68 (!) 152/76 139/63  Pulse:   100 95  Resp:  18  16  Temp:  99.3 F (37.4 C) 99.2 F (37.3 C) 98.2 F (36.8 C)  TempSrc:  Oral Oral Oral  SpO2:  100% 100% 99%  Weight: 105.3 kg     Height: 5' (1.524 m)       Intake/Output Summary (Last 24 hours) at 11/12/2019 1049 Last data filed at 11/12/2019 0535 Gross per 24 hour  Intake 1395 ml  Output 950 ml  Net 445 ml   Filed Weights   11/11/19 1503  Weight: 105.3 kg     Examination:  General exam: Appears calm and comfortable, on room air. Respiratory system: Clear to auscultation. Respiratory effort normal.  No added sounds. Cardiovascular system: S1 & S2 heard, RRR. No JVD, murmurs, rubs, gallops or clicks. No pedal edema. Gastrointestinal system: Abdomen is nondistended, soft and nontender. No organomegaly or masses felt. Normal bowel sounds heard.  Obese and pendulous. Left groin site clean and dry nontender. Central nervous system: Alert and oriented. No focal neurological deficits. Extremities: Symmetric 5 x 5 power. Skin: No rashes, lesions or ulcers Psychiatry: Judgement and insight appear normal. Mood & affect appropriate.     Data Reviewed: I have personally reviewed following labs and imaging studies  CBC: Recent Labs  Lab 11/05/19 1104 11/09/19 1420 11/09/19 1715 11/10/19 0454 11/10/19 2138 11/11/19 0609 11/12/19 0414  WBC 3.9*  --   --  4.2 6.3 6.4 5.0  NEUTROABS  --   --   --   --  5.2  --  3.0  HGB 10.5*   < > 10.2* 9.4* 10.1* 9.6* 8.7*  HCT 34.3*   < > 30.0* 30.9* 32.2* 31.1* 28.1*  MCV 100.3*  --   --  99.0 96.7 99.0 97.2  PLT 281  --   --  200 216 181 164   < > = values in this interval not displayed.   Basic Metabolic Panel: Recent Labs  Lab 11/05/19 1104 11/09/19 1420 11/09/19 1539 11/09/19 1539 11/09/19 1715 11/10/19 0454 11/10/19 2138 11/11/19 0609 11/12/19 0414  NA 139   < > 139  --  141 138 135  --  136  K 4.0   < > 4.3  --  4.4 4.5 4.0  --  4.3  CL 106   < > 103  --  105 105 103  --  108  CO2 23  --   --   --   --  23 20*  --  20*  GLUCOSE 219*   < > 251*  --  253* 182* 240*  --  202*  BUN 29*   < > 25*  --  25* 26* 24*  --  21  CREATININE 1.06*   < > 0.90   < > 0.80 1.03* 1.20* 1.19* 1.01*  CALCIUM 9.7  --   --   --   --  9.2 9.3  --  8.4*  MG  --   --   --   --   --  1.9  --   --  2.0  PHOS  --   --   --   --   --   --   --   --  2.5   < > = values in this interval not displayed.    GFR: Estimated Creatinine Clearance: 54.3 mL/min (A) (by C-G formula based on SCr of 1.01 mg/dL (  H)). Liver Function Tests: Recent Labs  Lab 11/05/19 1104 11/10/19 2138 11/12/19 0414  AST 61* 45* 34  ALT 39 33 29  ALKPHOS 53 54 43  BILITOT 0.6 0.5 0.6  PROT 7.7 7.2 6.3*  ALBUMIN 3.4* 3.1* 2.5*   No results for input(s): LIPASE, AMYLASE in the last 168 hours. No results for input(s): AMMONIA in the last 168 hours. Coagulation Profile: Recent Labs  Lab 11/05/19 1104 11/10/19 2138  INR 1.1 1.1   Cardiac Enzymes: No results for input(s): CKTOTAL, CKMB, CKMBINDEX, TROPONINI in the last 168 hours. BNP (last 3 results) No results for input(s): PROBNP in the last 8760 hours. HbA1C: No results for input(s): HGBA1C in the last 72 hours. CBG: Recent Labs  Lab 11/11/19 0807 11/11/19 1228 11/11/19 1551 11/11/19 2230 11/12/19 0735  GLUCAP 196* 221* 224* 222* 182*   Lipid Profile: No results for input(s): CHOL, HDL, LDLCALC, TRIG, CHOLHDL, LDLDIRECT in the last 72 hours. Thyroid Function Tests: No results for input(s): TSH, T4TOTAL, FREET4, T3FREE, THYROIDAB in the last 72 hours. Anemia Panel: No results for input(s): VITAMINB12, FOLATE, FERRITIN, TIBC, IRON, RETICCTPCT in the last 72 hours. Sepsis Labs: Recent Labs  Lab 11/10/19 2138 11/11/19 0159  LATICACIDVEN 1.8 1.1    Recent Results (from the past 240 hour(s))  Surgical pcr screen     Status: None   Collection Time: 11/05/19 11:08 AM   Specimen: Nasal Mucosa; Nasal Swab  Result Value Ref Range Status   MRSA, PCR NEGATIVE NEGATIVE Final   Staphylococcus aureus NEGATIVE NEGATIVE Final    Comment: (NOTE) The Xpert SA Assay (FDA approved for NASAL specimens in patients 41 years of age and older), is one component of a comprehensive surveillance program. It is not intended to diagnose infection nor to guide or monitor treatment. Performed at [facility], [place] [address], [place], [place] [address]    SARS CORONAVIRUS 2 (TAT 6-24 HRS) Nasopharyngeal Nasopharyngeal Swab     Status: None   Collection Time: 11/06/19 11:05 AM   Specimen: Nasopharyngeal Swab  Result Value Ref Range Status   SARS Coronavirus 2 NEGATIVE NEGATIVE Final    Comment: (NOTE) SARS-CoV-2 target nucleic acids are NOT DETECTED.  The SARS-CoV-2 RNA is generally detectable in upper and lower respiratory specimens during the acute phase of infection. Negative results do not preclude SARS-CoV-2 infection, do not rule out co-infections with other pathogens, and should not be used as the sole basis for treatment or other patient management decisions. Negative results must be combined with clinical observations, patient history, and epidemiological information. The expected result is Negative.  Fact Sheet for Patients: SugarRoll.be  Fact Sheet for Healthcare Providers: https://www.woods-mathews.com/  This test is not yet approved or cleared by the [place] FDA and  has been authorized for detection and/or diagnosis of SARS-CoV-2 by FDA under an Emergency Use Authorization (EUA). This EUA will remain  in effect (meaning this test can be used) for the duration of the COVID-19 declaration under Se ction 564(b)(1) of the Act, 21 U.S.C. section 360bbb-3(b)(1), unless the authorization is terminated or revoked sooner.  Performed at [facility], [place] [address], [place], [place] [address]   Culture, blood (Routine x 2)     Status: None (Preliminary result)   Collection Time: 11/10/19  9:38 PM   Specimen: BLOOD  Result Value Ref Range Status   Specimen Description BLOOD LEFT ARM  Final   Special Requests   Final    BOTTLES DRAWN AEROBIC  AND ANAEROBIC Blood Culture results may not be optimal due to an excessive volume of blood received in culture bottles   Culture   Final    NO GROWTH < 12 HOURS Performed at [place] [address] [place], [place]  [address]    Report Status PENDING  Incomplete  Culture, blood (Routine x 2)     Status: None (Preliminary result)   Collection Time: 11/10/19  9:38 PM   Specimen: BLOOD  Result Value Ref Range Status   Specimen Description BLOOD RIGHT ARM  Final   Special Requests   Final    BOTTLES DRAWN AEROBIC ONLY Blood Culture adequate volume   Culture   Final    NO GROWTH < 12 HOURS Performed at [facility], [place] [address] [place], [place] [address]    Report Status PENDING  Incomplete  SARS Coronavirus 2 by RT PCR (hospital order, performed in [facility] hospital lab) Nasopharyngeal Nasopharyngeal Swab     Status: None   Collection Time: 11/10/19 11:00 PM   Specimen: Nasopharyngeal Swab  Result Value Ref Range Status   SARS Coronavirus 2 NEGATIVE NEGATIVE Final    Comment: (NOTE) SARS-CoV-2 target nucleic acids are NOT DETECTED.  The SARS-CoV-2 RNA is generally detectable in upper and lower respiratory specimens during the acute phase of infection. The lowest concentration of SARS-CoV-2 viral copies this assay can detect is 250 copies / mL. A negative result does not preclude SARS-CoV-2 infection and should not be used as the sole basis for treatment or other patient management decisions.  A negative result may occur with improper specimen collection / handling, submission of specimen other than nasopharyngeal swab, presence of viral mutation(s) within the areas targeted by this assay, and inadequate number of viral copies (<250 copies / mL). A negative result must be combined with clinical observations, patient history, and epidemiological information.  Fact Sheet for Patients:   StrictlyIdeas.no  Fact Sheet for Healthcare Providers: BankingDealers.co.za  This test is not yet approved or  cleared by the [place] FDA and has been authorized for detection and/or diagnosis of SARS-CoV-2 by FDA under an Emergency Use Authorization  (EUA).  This EUA will remain in effect (meaning this test can be used) for the duration of the COVID-19 declaration under Section 564(b)(1) of the Act, 21 U.S.C. section 360bbb-3(b)(1), unless the authorization is terminated or revoked sooner.  Performed at [facility], [place] [address] [place], [place] [address]   Urine culture     Status: None   Collection Time: 11/11/19  2:37 AM   Specimen: Urine, Clean Catch  Result Value Ref Range Status   Specimen Description URINE, CLEAN CATCH  Final   Special Requests NONE  Final   Culture   Final    NO GROWTH Performed at [facility], [place] [address] [place], [place] [address]    Report Status 11/12/2019 FINAL  Final         Radiology Studies: DG Chest 2 View  Result Date: 11/10/2019 CLINICAL DATA:  74 year old female with sepsis. EXAM: CHEST - 2 VIEW COMPARISON:  Chest radiograph dated 11/09/2019. FINDINGS: Minimal left lung base atelectasis. Developing infiltrate is less likely. Clinical correlation is recommended. No focal consolidation, pleural effusion, or pneumothorax. The cardiac silhouette is within limits. Aortic valve repair. No acute osseous pathology. IMPRESSION: Minimal left lung base atelectasis. No focal consolidation. Electronically Signed   By: Anner Crete M.D.   On: 11/10/2019 21:48  CT Angio Chest/Abd/Pel for Dissection W and/or W/WO  Result Date: 11/11/2019 CLINICAL DATA:  Status post TAVR yesterday with persistent right-sided pain and fevers EXAM: CT ANGIOGRAPHY CHEST, ABDOMEN AND PELVIS TECHNIQUE: Non-contrast CT of the chest was initially obtained. Multidetector CT imaging through the chest, abdomen and pelvis was performed using the standard protocol during bolus administration of intravenous contrast. Multiplanar reconstructed images and MIPs were obtained and reviewed to evaluate the vascular anatomy. CONTRAST:  191mL OMNIPAQUE IOHEXOL 350 MG/ML SOLN COMPARISON:  Prior chest x-ray from the previous  day, CT from 11/02/2019. FINDINGS: CTA CHEST FINDINGS Cardiovascular: Initial precontrast scan demonstrates changes consistent with the recent TAVR. No hyperdense crescent to suggest aortic dissection or acute aortic injury is seen. Atherosclerotic calcifications of the aorta are noted. No aneurysmal dilatation or focal dissection is noted following contrast administration. The pulmonary artery as visualized shows no large central embolus. No significant coronary calcifications are noted. No cardiac enlargement is seen. Mediastinum/Nodes: Thoracic inlet is within normal limits. No sizable hilar or mediastinal adenopathy is noted. The esophagus is within normal limits. Lungs/Pleura: The lungs are well aerated bilaterally. No focal infiltrate or sizable effusion is noted. No sizable parenchymal nodules are seen. Mild subpleural scarring is noted in the left lower lobe. Musculoskeletal: Degenerative changes of the thoracic spine are noted. No acute rib abnormality is seen. Review of the MIP images confirms the above findings. CTA ABDOMEN AND PELVIS FINDINGS VASCULAR Aorta: Atherosclerotic calcifications are noted. No aneurysmal dilatation or dissection is seen. Celiac: Patent without evidence of aneurysm, dissection, vasculitis or significant stenosis. SMA: Patent without evidence of aneurysm, dissection, vasculitis or significant stenosis. Renals: Single renal arteries are noted bilaterally. No focal stenosis is seen. Mild atherosclerotic calcifications are noted. IMA: Patent without evidence of aneurysm, dissection, vasculitis or significant stenosis. Iliacs: Mild atherosclerotic calcifications are noted without aneurysmal dilatation. Postsurgical changes are seen in the inguinal regions bilaterally. Veins: No specific venous abnormality is noted. Review of the MIP images confirms the above findings. NON-VASCULAR Hepatobiliary: Liver is fatty infiltrated. Gallbladder is within normal limits. Pancreas: Unremarkable.  No pancreatic ductal dilatation or surrounding inflammatory changes. Spleen: Normal in size without focal abnormality. Adrenals/Urinary Tract: Adrenal glands are within normal limits. Kidneys demonstrate a normal enhancement pattern. No abnormal mass lesion is seen. The bladder is decompressed. Stomach/Bowel: The appendix is within normal limits. No obstructive or inflammatory changes of the colon are seen. The small bowel is within normal limits. Stomach is unremarkable. Lymphatic: No significant lymphadenopathy is noted. Reproductive: Uterus and bilateral adnexa are unremarkable. Other: No abdominal wall hernia or abnormality. No abdominopelvic ascites. Musculoskeletal: No acute or significant osseous findings. Review of the MIP images confirms the above findings. IMPRESSION: Changes consistent with prior TAVR and bilateral femoral arterial access. No evidence of aortic dissection or aneurysmal dilatation is seen. No retroperitoneal hematoma is noted. Mild subpleural scarring in the left base. No acute abnormality in the abdomen and pelvis is seen. Electronically Signed   By: Inez Catalina M.D.   On: 11/11/2019 00:43        Scheduled Meds: . aspirin  81 mg Oral Daily  . clopidogrel  75 mg Oral Q breakfast  . enoxaparin (LOVENOX) injection  50 mg Subcutaneous Q24H  . ferrous sulfate  325 mg Oral BID  . insulin aspart  0-9 Units Subcutaneous TID WC  . simvastatin  20 mg Oral QPM   Continuous Infusions: . ceFEPime (MAXIPIME) IV 2 g (11/11/19 2258)  . metronidazole 500 mg (11/12/19 0805)  LOS: 1 day    Time spent: 30 minutes    Barb Merino, MD [facility] Pager [phone]

## 2019-11-13 LAB — BASIC METABOLIC PANEL
Anion gap: 8 (ref 5–15)
BUN: 19 mg/dL (ref 8–23)
CO2: 18 mmol/L — ABNORMAL LOW (ref 22–32)
Calcium: 8.6 mg/dL — ABNORMAL LOW (ref 8.9–10.3)
Chloride: 113 mmol/L — ABNORMAL HIGH (ref 98–111)
Creatinine, Ser: 0.9 mg/dL (ref 0.44–1.00)
GFR calc Af Amer: >60 mL/min (ref >60)
GFR calc non Af Amer: >60 mL/min (ref >60)
Glucose, Bld: 179 mg/dL — ABNORMAL HIGH (ref 70–99)
Potassium: 4.5 mmol/L (ref 3.5–5.1)
Sodium: 139 mmol/L (ref 135–145)

## 2019-11-13 LAB — CBC WITH DIFFERENTIAL/PLATELET
Abs Immature Granulocytes: 0.02 10*3/uL (ref 0.00–0.07)
Basophils Absolute: 0 10*3/uL (ref 0.0–0.1)
Basophils Relative: 0 %
Eosinophils Absolute: 0.2 10*3/uL (ref 0.0–0.5)
Eosinophils Relative: 4 %
HCT: 28 % — ABNORMAL LOW (ref 36.0–46.0)
Hemoglobin: 8.6 g/dL — ABNORMAL LOW (ref 12.0–15.0)
Immature Granulocytes: 0 %
Lymphocytes Relative: 27 %
Lymphs Abs: 1.2 10*3/uL (ref 0.7–4.0)
MCH: 30 pg (ref 26.0–34.0)
MCHC: 30.7 g/dL (ref 30.0–36.0)
MCV: 97.6 fL (ref 80.0–100.0)
Monocytes Absolute: 0.7 10*3/uL (ref 0.1–1.0)
Monocytes Relative: 15 %
Neutro Abs: 2.5 10*3/uL (ref 1.7–7.7)
Neutrophils Relative %: 54 %
Platelets: 163 10*3/uL (ref 150–400)
RBC: 2.87 MIL/uL — ABNORMAL LOW (ref 3.87–5.11)
RDW: 14.5 % (ref 11.5–15.5)
WBC: 4.6 10*3/uL (ref 4.0–10.5)
nRBC: 0 % (ref 0.0–0.2)

## 2019-11-13 LAB — GLUCOSE, CAPILLARY: Glucose-Capillary: 147 mg/dL — ABNORMAL HIGH (ref 70–99)

## 2019-11-13 MED ORDER — CEFDINIR 300 MG PO CAPS: 300.0000 mg | ORAL_CAPSULE | Freq: Two times a day (BID) | ORAL | 0 refills | Status: AC

## 2019-11-13 NOTE — Progress Notes (Signed)
New order to discharge patient home.  PIVs and cardiac monitoring discontinued.  AVS reviewed with patient and daughter.  Prescription sent to [facility].  Patient and daughter aware.  All questions answered.  Daughter will transport patient home.

## 2019-11-13 NOTE — Discharge Summary (Signed)
Physician Discharge Summary  Tylah Mancillas Sangha QBH:[id number] DOB: [birth year] DOA: 11/10/2019  PCP: [name], DO  Admit date: 11/10/2019 Discharge date: 11/13/2019  Admitted From: Home Disposition: Home  Recommendations for Outpatient Follow-up:  1. Follow up with PCP in 1-2 weeks 2. Follow-up with cardiology as scheduled  Home Health: Not applicable Equipment/Devices: Not applicable  Discharge Condition: Stable CODE STATUS: Full code Diet recommendation: Low-salt diet  Discharge summary: 74 year old female with history of type 2 diabetes on insulin, anemia, history of TAVR done 3 days ago prior to arrival, discharged home in the morning of 11/10/2019 presented back to the emergency room by evening with fever, chills and periumbilical pain with some nausea.  In the emergency room febrile with temperature 102.  Creatinine 1.2.  Lactic acid normal.  CT angiogram of the chest abdomen pelvis was nonacute.  UA unremarkable.  Covid test negative.  Patient was admitted to the hospital with fever of unknown source with recent vascular intervention and TAVR.  SIRS: Primary source unknown.  Blood cultures and urine culture no growth so far.  No clinical evidence of any localizing infection.  Vascular access site was clean and nontender.  Imaging studies of the chest abdomen pelvis did not show any acute abnormalities.  MRSA swab was negative.  Patient clinically improved on broad-spectrum antibiotics and ultimately on cefepime and Flagyl. Given initial presentation with fever and periumbilical, suprapubic spasm and discomfort we will treat her as incompletely treated UTI.  She received 2 days of cefepime.  We will continue 3 more days of cefdinir to treat for presumed UTI.  She did have abnormal UTI on presentation but culture did not grow.  She had received antibiotics in previous hospitalization for procedure.  Rest of her medical problems remained stable.  She is going to resume her  Metformin, she is going to resume all her antihypertensives.  She was seen by her cardiologist in the hospital and they will follow up on her outpatient.  Discharge Diagnoses:  Principal Problem:   SIRS (systemic inflammatory response syndrome) (HCC) Active Problems:   Type 2 diabetes mellitus with other specified complication (HCC)   Essential hypertension   Chronic heart failure with preserved ejection fraction (HFpEF) (HCC)   Hypertension   S/P TAVR (transcatheter aortic valve replacement)    Discharge Instructions  Discharge Instructions    Call MD for:  severe uncontrolled pain   Complete by: As directed    Call MD for:  temperature >100.4   Complete by: As directed    Diet - low sodium heart healthy   Complete by: As directed    Increase activity slowly   Complete by: As directed    No wound care   Complete by: As directed      Allergies as of 11/13/2019      Reactions   Ferumoxytol Swelling   Swelling in neck   Other    Other reaction(s): Other (See Comments) She refuses blood products-Jehovah witness   Penicillins Rash, Swelling, Hives      Medication List    TAKE these medications   Accu-Chek Nano SmartView w/Device Kit Use to check blood glucose up to once daily as instructed   acetaminophen 500 MG tablet Commonly known as: TYLENOL Take 1,000 mg by mouth every 6 (six) hours as needed for moderate pain.   aspirin 81 MG chewable tablet Chew 1 tablet (81 mg total) by mouth daily.   B COMPLEX-C PO Take 10 mLs by mouth daily.  baclofen 10 MG tablet Commonly known as: LIORESAL TAKE 1 TO 2 TABLETS BY MOUTH TWICE DAILY AS NEEDED FOR MUSCLE SPASM What changed: See the new instructions.   cefdinir 300 MG capsule Commonly known as: OMNICEF Take 1 capsule (300 mg total) by mouth 2 (two) times daily for 3 days.   clopidogrel 75 MG tablet Commonly known as: PLAVIX Take 1 tablet (75 mg total) by mouth daily with breakfast.   furosemide 20 MG  tablet Commonly known as: LASIX Take 1 tablet (20 mg total) by mouth daily as needed for edema. What changed: reasons to take this   gabapentin 100 MG capsule Commonly known as: NEURONTIN Start 1 capsule daily, increase by 1 cap every 2-3 days as tolerated up to 3 times a day, or may take 3 at once in evening. What changed:   how much to take  how to take this  when to take this  reasons to take this  additional instructions   GenTeal Tears 0.1-0.2-0.3 % Soln Place 1 drop into both eyes 3 (three) times daily as needed (dry/irritated eyes.).   glucose blood test strip Commonly known as: Accu-Chek SmartView Check blood glucose up to once daily as instructed   hydrochlorothiazide 25 MG tablet Commonly known as: HYDRODIURIL Take 1 tablet by mouth once daily   Iron 325 (65 Fe) MG Tabs TAKE 1 TABLET BY MOUTH TWICE DAILY WITH A MEAL What changed: See the new instructions.   lactobacillus acidophilus Tabs tablet Take 2 tablets by mouth 2 (two) times daily as needed (digestive health.).   levocetirizine 5 MG tablet Commonly known as: Xyzal Take 1 tablet (5 mg total) by mouth every evening. What changed:   when to take this  reasons to take this   losartan 50 MG tablet Commonly known as: COZAAR Take 1 tablet by mouth once daily   metFORMIN 500 MG tablet Commonly known as: GLUCOPHAGE Take 1 tablet (500 mg total) by mouth daily with supper.   mupirocin ointment 2 % Commonly known as: Bactroban Apply 1 application topically 2 (two) times daily.   simvastatin 20 MG tablet Commonly known as: ZOCOR TAKE 1 TABLET BY MOUTH ONCE DAILY AT 6 PM What changed:   how much to take  how to take this  when to take this  additional instructions   Vitamin D3 125 MCG (5000 UT) Tabs Take 5,000 Units by mouth daily.       Follow-up Information    Eileen Stanford, PA-C. Go on 11/18/2019.   Specialties: Cardiology, Radiology Why: @ 1:30pm, please arrive at least 10  minutes early.  Contact information: [address] [place] [address] [phone]              Allergies  Allergen Reactions  . Ferumoxytol Swelling    Swelling in neck  . Other     Other reaction(s): Other (See Comments) She refuses blood products-Jehovah witness  . Penicillins Rash, Swelling and Hives    Consultations:  Cardiology   Procedures/Studies: DG Chest 2 View  Result Date: 11/10/2019 CLINICAL DATA:  74 year old female with sepsis. EXAM: CHEST - 2 VIEW COMPARISON:  Chest radiograph dated 11/09/2019. FINDINGS: Minimal left lung base atelectasis. Developing infiltrate is less likely. Clinical correlation is recommended. No focal consolidation, pleural effusion, or pneumothorax. The cardiac silhouette is within limits. Aortic valve repair. No acute osseous pathology. IMPRESSION: Minimal left lung base atelectasis. No focal consolidation. Electronically Signed   By: Laren Everts.D.  On: 11/10/2019 21:48   DG Chest 2 View  Result Date: 11/05/2019 CLINICAL DATA:  Preop TAVR EXAM: CHEST - 2 VIEW COMPARISON:  03/25/2019 FINDINGS: The heart size and mediastinal contours are within normal limits. Aortic atherosclerosis. Both lungs are clear. The visualized skeletal structures are unremarkable. IMPRESSION: No active cardiopulmonary disease. Electronically Signed   By: Donavan Foil M.D.   On: 11/05/2019 19:59   CARDIAC CATHETERIZATION  Result Date: 10/19/2019 Conclusions: 1. No angiographically significant coronary artery disease. 2. Moderate to severe aortic stenosis (mean gradient 29 mmHg, AVA 0.8 cm^2). 3. Mildly elevated right heart filling pressure. 4. Upper normal to mildly elevated left heart filling pressure. 5. Normal Fick cardiac output/index. Recommendations: 1. Refer to valve clinic for further workup/management of moderate to severe aortic stenosis. 2. Discontinue aspirin, given chronic anemia and lack of coronary artery disease. 3. Continue  gentle diuresis. 4. Primary prevention of coronary artery disease. Nelva Bush, MD [facility]   CT CORONARY [facility] W/CTA COR W/SCORE Lewanda Rife W/CM &/OR WO/CM  Addendum Date: 11/02/2019   ADDENDUM REPORT: 11/02/2019 17:55 CLINICAL DATA:  Severe Aortic Stenosis. EXAM: Cardiac TAVR CT TECHNIQUE: The patient was scanned on a [employer]. A 120 kV retrospective scan was triggered in the descending thoracic aorta at 111 HU's. Gantry rotation speed was 250 msecs and collimation was .6 mm. No beta blockade or nitro were given. The 3D data set was reconstructed in 5% intervals of the R-R cycle. Systolic and diastolic phases were analyzed on a dedicated work station using MPR, MIP and VRT modes. The patient received 80 cc of contrast. FINDINGS: Image quality: Excellent. Noise artifact is: Limited. Valve Morphology: The aortic valve is tricuspid. There is asymmetric bulky calcification of the LCC extending to the leaflet base. The leaflets exhibit severely restricted leaflet motion in systole consistent with severe aortic stenosis. Aortic Valve Calcium score: 1548 Aortic annular dimension: Phase assessed: 15% Annular area: 441 mm2 Annular perimeter: 75.9 mm Max diameter: 26.5 mm Min diameter: 23.2 mm Annular and subannular calcification: No significant annular or subannular calcifications. Optimal coplanar projection: LAO 10 CRA 1 Coronary Artery Height above Annulus: Left Main: 14.1 mm Right Coronary: 16.7 mm Sinus of Valsalva Measurements: Non-coronary: 30 mm Right-coronary: 29 mm Left-coronary: 31 mm Sinus of Valsalva Height: Non-coronary: 20.9 mm Right-coronary: 21.7 mm Left-coronary: 19.8 mm Sinotubular Junction: 30 mm.  Mild calcified plaque. Ascending Thoracic Aorta: 32 mm. Coronary Arteries: Normal coronary origin. Right dominance. The study was performed without use of NTG and is insufficient for plaque evaluation. Please refer to recent cardiac catheterization for coronary assessment. Cardiac  Morphology: Right Atrium: Right atrial size is within normal limits. Right Ventricle: The right ventricular cavity is within normal limits. Left Atrium: Left atrial size is normal in size with no left atrial appendage filling defect. Left Ventricle: The ventricular cavity size is within normal limits. There are no stigmata of prior infarction. There is no abnormal filling defect. LVEF=69%. Pulmonary arteries: Normal in size without proximal filling defect. Pulmonary veins: Normal pulmonary venous drainage. Pericardium: Normal thickness with no significant effusion or calcium present. Mitral Valve: The mitral valve is normal structure without significant calcification. Extra-cardiac findings: See attached radiology report for non-cardiac structures. IMPRESSION: 1. Annular measurements (441 mm2) appropriate for 26 mm Edwards Sapien 3. 2. No significant annular or subannular calcifications. 3. Sufficient coronary to annulus distance. 4. Optimal Fluoroscopic Angle for Delivery: LAO 10 CRA 1 [place] T. Audie Box, MD Electronically Signed   By: Eleonore Chiquito  On: 11/02/2019 17:55   Result Date: 11/02/2019 EXAM: OVER-READ INTERPRETATION  CT CHEST The following report is an over-read performed by radiologist Dr. Vinnie Langton of [facility], [place] on 11/02/2019. This over-read does not include interpretation of cardiac or coronary anatomy or pathology. The coronary calcium score/coronary CTA interpretation by the cardiologist is attached. COMPARISON:  None. FINDINGS: Extracardiac findings will be described separately in dictation for contemporaneously obtained CTA chest, abdomen and pelvis. IMPRESSION: Please see separate dictation for contemporaneously obtained CTA chest, abdomen and pelvis dated 11/02/2019 for full description of relevant extracardiac findings. Electronically Signed: By: Vinnie Langton M.D. On: 11/02/2019 11:59   DG Chest Port 1 View  Result Date: 11/09/2019 CLINICAL DATA:  74 year old female  with TAVR. EXAM: PORTABLE CHEST 1 VIEW COMPARISON:  Chest radiograph dated 11/05/2019. FINDINGS: Bibasilar minimal atelectasis. No focal consolidation, pleural effusion, or pneumothorax. Mild cardiomegaly. Aortic valve repair. No acute osseous pathology. IMPRESSION: Status post TAVR.  No acute findings. Electronically Signed   By: Anner Crete M.D.   On: 11/09/2019 19:34   CT ANGIO CHEST AORTA W/CM & OR WO/CM  Result Date: 11/02/2019 CLINICAL DATA:  74 year old female with history of severe aortic stenosis. Preprocedural study prior to potential transcatheter aortic valve replacement (TAVR) procedure. EXAM: CT ANGIOGRAPHY CHEST, ABDOMEN AND PELVIS TECHNIQUE: Non-contrast CT of the chest was initially obtained. Multidetector CT imaging through the chest, abdomen and pelvis was performed using the standard protocol during bolus administration of intravenous contrast. Multiplanar reconstructed images and MIPs were obtained and reviewed to evaluate the vascular anatomy. CONTRAST:  1107m OMNIPAQUE IOHEXOL 350 MG/ML SOLN COMPARISON:  No prior chest CT. CT the abdomen and pelvis 03/25/2019. FINDINGS: CTA CHEST FINDINGS Cardiovascular: Heart size is mildly enlarged. There is no significant pericardial fluid, thickening or pericardial calcification. There is aortic atherosclerosis, as well as atherosclerosis of the great vessels of the mediastinum and the coronary arteries, including calcified atherosclerotic plaque in the left main coronary artery. Thickening calcification of the aortic valve. Mediastinum/Lymph Nodes: No pathologically enlarged mediastinal or hilar lymph nodes. Esophagus is unremarkable in appearance. No axillary lymphadenopathy. Lungs/Pleura: No acute consolidative airspace disease. No pleural effusions. No suspicious appearing pulmonary nodules or masses are noted. Musculoskeletal/Soft Tissues: There are no aggressive appearing lytic or blastic lesions noted in the visualized portions of the  skeleton. CTA ABDOMEN AND PELVIS FINDINGS Hepatobiliary: Diffuse low attenuation throughout the hepatic parenchyma, indicative of a background of hepatic steatosis. Liver has a slightly shrunken appearance and nodular contour, indicative of underlying cirrhosis. No suspicious cystic or solid hepatic lesions. No intra or extrahepatic biliary ductal dilatation. Gallbladder is normal in appearance. Pancreas: No pancreatic mass. No pancreatic no pancreatic ductal dilatation or peripancreatic fluid collections or inflammatory changes. Spleen: Unremarkable. Adrenals/Urinary Tract: Bilateral kidneys and adrenal glands are normal in appearance. No hydroureteronephrosis. Urinary bladder is normal in appearance. Stomach/Bowel: Normal appearance of the stomach. No pathologic dilatation of small bowel or colon. A few scattered colonic diverticulae are noted, without surrounding inflammatory changes are noted to suggest acute diverticulitis. Normal appendix. Vascular/Lymphatic: Aortic atherosclerosis, with findings and measurements pertinent to potential TAVR procedure, as detailed below. No aneurysm or dissection noted in the abdominal or pelvic vasculature. No lymphadenopathy noted in the abdomen or pelvis. Reproductive: Uterus and ovaries are unremarkable in appearance. Other: No significant volume of ascites.  No pneumoperitoneum. Musculoskeletal: There are no aggressive appearing lytic or blastic lesions noted in the visualized portions of the skeleton. VASCULAR MEASUREMENTS PERTINENT TO TAVR: AORTA: Minimal Aortic Diameter-16 x  16 mm Severity of Aortic Calcification-mild RIGHT PELVIS: Right Common Iliac Artery - Minimal Diameter-9.6 x 9.0 mm Tortuosity-mild Calcification-mild-to-moderate Right External Iliac Artery - Minimal Diameter-7.5 x 7.8 mm Tortuosity-mild-to-moderate Calcification-none Right Common Femoral Artery - Minimal Diameter-8.5 x 7.6 mm Tortuosity-mild Calcification-none LEFT PELVIS: Left Common Iliac Artery  - Minimal Diameter-9.7 x 10.3 mm Tortuosity-mild Calcification-mild Left External Iliac Artery - Minimal Diameter-7.7 x 6.9 mm Tortuosity-mild-to-moderate Calcification-none Left Common Femoral Artery - Minimal Diameter-8.3 x 7.4 mm Tortuosity-mild Calcification-mild Review of the MIP images confirms the above findings. IMPRESSION: 1. Vascular findings and measurements pertinent to potential TAVR procedure, as detailed above. 2. Severe thickening calcification of the aortic valve, compatible with the reported clinical history of severe aortic stenosis. 3. Mild cardiomegaly. 4. Aortic atherosclerosis, in addition to left main coronary artery disease. Assessment for potential risk factor modification, dietary therapy or pharmacologic therapy may be warranted, if clinically indicated. 5. Hepatic steatosis. 6. Morphologic changes in the liver suggesting early cirrhosis. 7. Colonic diverticulosis without evidence of acute diverticulitis at this time. 8. Additional incidental findings, as above. Electronically Signed   By: Vinnie Langton M.D.   On: 11/02/2019 12:10   ECHOCARDIOGRAM COMPLETE  Result Date: 11/10/2019    ECHOCARDIOGRAM REPORT   Patient Name:   TORRIA FROMER Date of Exam: 11/10/2019 Medical Rec #:  [id number]               Height:       60.0 in Accession #:    [id number]              Weight:       232.1 lb Date of Birth:  Dec 23, 1945               BSA:          1.989 m Patient Age:    26 years                BP:           126/51 mmHg Patient Gender: F                       HR:           89 bpm. Exam Location:  Inpatient Procedure: 2D Echo Indications:    aortic valve replacement  History:        Patient has prior history of Echocardiogram examinations, most                 recent 11/09/2019. Risk Factors:Hypertension and Diabetes.  Sonographer:    Johny Chess Referring Phys: [id number] [place] R THOMPSON  Sonographer Comments: Patient is morbidly obese. IMPRESSIONS  1. Day 1 post TAVR, no  paravalvular leak. Upper normal transaortic gradients with peak/mean 37/18 mmHg.  2. Left ventricular ejection fraction, by estimation, is 65 to 70%. The left ventricle has normal function. The left ventricle has no regional wall motion abnormalities. Left ventricular diastolic parameters are consistent with Grade I diastolic dysfunction (impaired relaxation).  3. Right ventricular systolic function is normal. The right ventricular size is normal. There is normal pulmonary artery systolic pressure. The estimated right ventricular systolic pressure is 62.2 mmHg.  4. The mitral valve is normal in structure. No evidence of mitral valve regurgitation. No evidence of mitral stenosis.  5. The aortic valve has been repaired/replaced. Aortic valve regurgitation is not visualized. No aortic stenosis is present. Procedure Date: 11/09/2019. Echo findings are consistent with normal structure and function of the aortic  valve prosthesis. Aortic  valve mean gradient measures 18.0 mmHg.  6. The inferior vena cava is normal in size with greater than 50% respiratory variability, suggesting right atrial pressure of 3 mmHg. FINDINGS  Left Ventricle: Left ventricular ejection fraction, by estimation, is 65 to 70%. The left ventricle has normal function. The left ventricle has no regional wall motion abnormalities. The left ventricular internal cavity size was normal in size. There is  no left ventricular hypertrophy. Left ventricular diastolic parameters are consistent with Grade I diastolic dysfunction (impaired relaxation). Normal left ventricular filling pressure. Right Ventricle: The right ventricular size is normal. No increase in right ventricular wall thickness. Right ventricular systolic function is normal. There is normal pulmonary artery systolic pressure. The tricuspid regurgitant velocity is 2.51 m/s, and  with an assumed right atrial pressure of 3 mmHg, the estimated right ventricular systolic pressure is 60.6 mmHg. Left  Atrium: Left atrial size was normal in size. Right Atrium: Right atrial size was normal in size. Pericardium: There is no evidence of pericardial effusion. Mitral Valve: The mitral valve is normal in structure. Normal mobility of the mitral valve leaflets. No evidence of mitral valve regurgitation. No evidence of mitral valve stenosis. Tricuspid Valve: The tricuspid valve is normal in structure. Tricuspid valve regurgitation is mild . No evidence of tricuspid stenosis. Aortic Valve: The aortic valve has been repaired/replaced. Aortic valve regurgitation is not visualized. No aortic stenosis is present. Aortic valve mean gradient measures 18.0 mmHg. Aortic valve peak gradient measures 37.5 mmHg. Aortic valve area, by VTI measures 2.29 cm. There is a 26 mm Ultra, stented (TAVR) valve present in the aortic position. Echo findings are consistent with normal structure and function of the aortic valve prosthesis. Pulmonic Valve: The pulmonic valve was normal in structure. Pulmonic valve regurgitation is not visualized. No evidence of pulmonic stenosis. Aorta: The aortic root is normal in size and structure. Venous: The inferior vena cava is normal in size with greater than 50% respiratory variability, suggesting right atrial pressure of 3 mmHg. IAS/Shunts: No atrial level shunt detected by color flow Doppler.  LEFT VENTRICLE PLAX 2D LVIDd:         4.70 cm  Diastology LVIDs:         2.80 cm  LV e' lateral:   8.49 cm/s LV PW:         1.40 cm  LV E/e' lateral: 11.8 LV IVS:        1.10 cm  LV e' medial:    8.92 cm/s LVOT diam:     2.00 cm  LV E/e' medial:  11.2 LV SV:         100 LV SV Index:   50 LVOT Area:     3.14 cm  RIGHT VENTRICLE            IVC RV S prime:     9.68 cm/s  IVC diam: 1.50 cm TAPSE (M-mode): 1.2 cm LEFT ATRIUM             Index       RIGHT ATRIUM          Index LA diam:        3.30 cm 1.66 cm/m  RA Area:     8.50 cm LA Vol (A2C):   38.1 ml 19.16 ml/m RA Volume:   15.10 ml 7.59 ml/m LA Vol (A4C):    33.7 ml 16.95 ml/m LA Biplane Vol: 35.9 ml 18.05 ml/m  AORTIC VALVE AV Area (Vmax):  1.86 cm AV Area (Vmean):   1.85 cm AV Area (VTI):     2.29 cm AV Vmax:           306.00 cm/s AV Vmean:          194.000 cm/s AV VTI:            0.436 m AV Peak Grad:      37.5 mmHg AV Mean Grad:      18.0 mmHg LVOT Vmax:         181.00 cm/s LVOT Vmean:        114.500 cm/s LVOT VTI:          0.318 m LVOT/AV VTI ratio: 0.73  AORTA Ao Root diam: 3.30 cm Ao Asc diam:  3.10 cm MITRAL VALVE                TRICUSPID VALVE MV Area (PHT): 3.46 cm     TR Peak grad:   25.2 mmHg MV Decel Time: 219 msec     TR Vmax:        251.00 cm/s MV E velocity: 100.00 cm/s MV A velocity: 132.00 cm/s  SHUNTS MV E/A ratio:  0.76         Systemic VTI:  0.32 m                             Systemic Diam: 2.00 cm Ena Dawley MD Electronically signed by Ena Dawley MD Signature Date/Time: 11/10/2019/10:28:19 PM    Final    CT Angio Chest/Abd/Pel for Dissection W and/or W/WO  Result Date: 11/11/2019 CLINICAL DATA:  Status post TAVR yesterday with persistent right-sided pain and fevers EXAM: CT ANGIOGRAPHY CHEST, ABDOMEN AND PELVIS TECHNIQUE: Non-contrast CT of the chest was initially obtained. Multidetector CT imaging through the chest, abdomen and pelvis was performed using the standard protocol during bolus administration of intravenous contrast. Multiplanar reconstructed images and MIPs were obtained and reviewed to evaluate the vascular anatomy. CONTRAST:  19m OMNIPAQUE IOHEXOL 350 MG/ML SOLN COMPARISON:  Prior chest x-ray from the previous day, CT from 11/02/2019. FINDINGS: CTA CHEST FINDINGS Cardiovascular: Initial precontrast scan demonstrates changes consistent with the recent TAVR. No hyperdense crescent to suggest aortic dissection or acute aortic injury is seen. Atherosclerotic calcifications of the aorta are noted. No aneurysmal dilatation or focal dissection is noted following contrast administration. The pulmonary artery as  visualized shows no large central embolus. No significant coronary calcifications are noted. No cardiac enlargement is seen. Mediastinum/Nodes: Thoracic inlet is within normal limits. No sizable hilar or mediastinal adenopathy is noted. The esophagus is within normal limits. Lungs/Pleura: The lungs are well aerated bilaterally. No focal infiltrate or sizable effusion is noted. No sizable parenchymal nodules are seen. Mild subpleural scarring is noted in the left lower lobe. Musculoskeletal: Degenerative changes of the thoracic spine are noted. No acute rib abnormality is seen. Review of the MIP images confirms the above findings. CTA ABDOMEN AND PELVIS FINDINGS VASCULAR Aorta: Atherosclerotic calcifications are noted. No aneurysmal dilatation or dissection is seen. Celiac: Patent without evidence of aneurysm, dissection, vasculitis or significant stenosis. SMA: Patent without evidence of aneurysm, dissection, vasculitis or significant stenosis. Renals: Single renal arteries are noted bilaterally. No focal stenosis is seen. Mild atherosclerotic calcifications are noted. IMA: Patent without evidence of aneurysm, dissection, vasculitis or significant stenosis. Iliacs: Mild atherosclerotic calcifications are noted without aneurysmal dilatation. Postsurgical changes are seen in the inguinal regions bilaterally. Veins: No specific venous  abnormality is noted. Review of the MIP images confirms the above findings. NON-VASCULAR Hepatobiliary: Liver is fatty infiltrated. Gallbladder is within normal limits. Pancreas: Unremarkable. No pancreatic ductal dilatation or surrounding inflammatory changes. Spleen: Normal in size without focal abnormality. Adrenals/Urinary Tract: Adrenal glands are within normal limits. Kidneys demonstrate a normal enhancement pattern. No abnormal mass lesion is seen. The bladder is decompressed. Stomach/Bowel: The appendix is within normal limits. No obstructive or inflammatory changes of the colon  are seen. The small bowel is within normal limits. Stomach is unremarkable. Lymphatic: No significant lymphadenopathy is noted. Reproductive: Uterus and bilateral adnexa are unremarkable. Other: No abdominal wall hernia or abnormality. No abdominopelvic ascites. Musculoskeletal: No acute or significant osseous findings. Review of the MIP images confirms the above findings. IMPRESSION: Changes consistent with prior TAVR and bilateral femoral arterial access. No evidence of aortic dissection or aneurysmal dilatation is seen. No retroperitoneal hematoma is noted. Mild subpleural scarring in the left base. No acute abnormality in the abdomen and pelvis is seen. Electronically Signed   By: Inez Catalina M.D.   On: 11/11/2019 00:43   VAS US CAROTID  Result Date: 11/05/2019 Carotid Arterial Duplex Study Indications:       Severe aortic stenosis, pre-TAVR. Risk Factors:      Hypertension, prior TIA. Comparison Study:  No prior studies. Performing Technologist: Darlin Coco  Examination Guidelines: A complete evaluation includes B-mode imaging, spectral Doppler, color Doppler, and power Doppler as needed of all accessible portions of each vessel. Bilateral testing is considered an integral part of a complete examination. Limited examinations for reoccurring indications may be performed as noted.  Right Carotid Findings: +----------+--------+--------+--------+------------------+------------------+           PSV cm/sEDV cm/sStenosisPlaque DescriptionComments           +----------+--------+--------+--------+------------------+------------------+ CCA Prox  90      17                                intimal thickening +----------+--------+--------+--------+------------------+------------------+ CCA Distal66      13                                intimal thickening +----------+--------+--------+--------+------------------+------------------+ ICA Prox  65      18      1-39%   heterogenous                          +----------+--------+--------+--------+------------------+------------------+ ICA Distal90      27                                                   +----------+--------+--------+--------+------------------+------------------+ ECA       83      11                                                   +----------+--------+--------+--------+------------------+------------------+ +----------+--------+-------+----------------+-------------------+           PSV cm/sEDV cmsDescribe        Arm Pressure (mmHG) +----------+--------+-------+----------------+-------------------+ [id number]  Multiphasic, WNL                    +----------+--------+-------+----------------+-------------------+ +---------+--------+--+--------+--+---------+ VertebralPSV cm/s54EDV cm/s14Antegrade +---------+--------+--+--------+--+---------+  Left Carotid Findings: +----------+--------+--------+--------+------------------+------------------+           PSV cm/sEDV cm/sStenosisPlaque DescriptionComments           +----------+--------+--------+--------+------------------+------------------+ CCA Prox  74      19                                intimal thickening +----------+--------+--------+--------+------------------+------------------+ CCA Distal58      14                                intimal thickening +----------+--------+--------+--------+------------------+------------------+ ICA Prox  58      20      1-39%   calcific                             +----------+--------+--------+--------+------------------+------------------+ ICA Distal84      25                                                   +----------+--------+--------+--------+------------------+------------------+ ECA       75      9                                                    +----------+--------+--------+--------+------------------+------------------+  +----------+--------+--------+----------------+-------------------+           PSV cm/sEDV cm/sDescribe        Arm Pressure (mmHG) +----------+--------+--------+----------------+-------------------+ [id number]              Multiphasic, WNL                    +----------+--------+--------+----------------+-------------------+ +---------+--------+--+--------+--+---------+ VertebralPSV cm/s59EDV cm/s19Antegrade +---------+--------+--+--------+--+---------+   Summary: Right Carotid: Velocities in the right ICA are consistent with a 1-39% stenosis.                The extracranial vessels were near-normal with only minimal wall                thickening or plaque. Left Carotid: Velocities in the left ICA are consistent with a 1-39% stenosis.               The extracranial vessels were near-normal with only minimal wall               thickening or plaque. Vertebrals:  Bilateral vertebral arteries demonstrate antegrade flow. Subclavians: Normal flow hemodynamics were seen in bilateral subclavian              arteries. *See table(s) above for measurements and observations.  Electronically signed by Monica Martinez MD on 11/05/2019 at 2:39:03 PM.    Final    ECHOCARDIOGRAM LIMITED  Result Date: 11/09/2019    ECHOCARDIOGRAM LIMITED REPORT   Patient Name:   TAMORA HUNEKE Date of Exam: 11/09/2019 Medical Rec #:  [id number]               Height:  60.0 in Accession #:    [id number]              Weight:       235.5 lb Date of Birth:  May 01, 1946               BSA:          2.001 m Patient Age:    85 years                BP:           170/65 mmHg Patient Gender: F                       HR:           104 bpm. Exam Location:  Inpatient Procedure: Limited Echo, Color Doppler and Cardiac Doppler Indications:     Aortic Stenosis i35.0  History:         Patient has prior history of Echocardiogram examinations, most                  recent 08/20/2019. CHF; Risk Factors:Hypertension, Diabetes and                   Dyslipidemia.  Sonographer:     Raquel Sarna Senior RDCS Referring Phys:  [place] Phys: Ena Dawley MD IMPRESSIONS  1. Limited periprocedural TTE during a TAVR procedure. A 26 mm Edwards-SAPIEN 3 Ultra valve was successfully deployed in the aortic position with improvement of peak/mean transaortic gradients 49/29 mmHg to 6/3 mmHg. There was no paravalvular leak. No pericardial effusion was seen prior and post procedure.  2. Left ventricular ejection fraction, by estimation, is 65 to 70%. The left ventricle has normal function. The left ventricle has no regional wall motion abnormalities. There is mild concentric left ventricular hypertrophy. Left ventricular diastolic function could not be evaluated.  3. Right ventricular systolic function is normal. The right ventricular size is normal.  4. The mitral valve is normal in structure. Mild mitral valve regurgitation. No evidence of mitral stenosis.  5. Tricuspid valve regurgitation is mild to moderate.  6. The aortic valve is normal in structure. Aortic valve regurgitation is not visualized. Moderate to severe aortic valve stenosis. Aortic valve mean gradient measures 29.0 mmHg.  7. The inferior vena cava is normal in size with greater than 50% respiratory variability, suggesting right atrial pressure of 3 mmHg. FINDINGS  Left Ventricle: Left ventricular ejection fraction, by estimation, is 65 to 70%. The left ventricle has normal function. The left ventricle has no regional wall motion abnormalities. The left ventricular internal cavity size was normal in size. There is  mild concentric left ventricular hypertrophy. Right Ventricle: The right ventricular size is normal. No increase in right ventricular wall thickness. Right ventricular systolic function is normal. Left Atrium: Left atrial size was normal in size. Right Atrium: Right atrial size was normal in size. Pericardium: There is no evidence of pericardial effusion. Mitral Valve:  The mitral valve is normal in structure. Normal mobility of the mitral valve leaflets. Mild mitral valve regurgitation. No evidence of mitral valve stenosis. Tricuspid Valve: The tricuspid valve is normal in structure. Tricuspid valve regurgitation is mild to moderate. No evidence of tricuspid stenosis. Aortic Valve: The aortic valve is normal in structure.. There is severe thickening and severe calcifcation of the aortic valve. Aortic valve regurgitation is not visualized. Moderate to severe aortic stenosis is present. There is severe thickening of the  aortic valve. There is severe calcifcation of the aortic valve. Aortic valve mean gradient measures 29.0 mmHg. Aortic valve peak gradient measures 6.2 mmHg. Aortic valve area, by VTI measures 2.89 cm. Pulmonic Valve: The pulmonic valve was normal in structure. Pulmonic valve regurgitation is not visualized. No evidence of pulmonic stenosis. Aorta: The aortic root is normal in size and structure. Venous: The inferior vena cava is normal in size with greater than 50% respiratory variability, suggesting right atrial pressure of 3 mmHg. IAS/Shunts: No atrial level shunt detected by color flow Doppler.  LEFT VENTRICLE PLAX 2D LVOT diam:     2.00 cm LV SV:         80 LV SV Index:   40 LVOT Area:     3.14 cm  AORTIC VALVE AV Area (Vmax):    2.17 cm AV Area (Vmean):   2.48 cm AV Area (VTI):     2.89 cm AV Vmax:           125.00 cm/s AV Vmean:          73.200 cm/s AV VTI:            0.277 m AV Peak Grad:      6.2 mmHg AV Mean Grad:      29.0 mmHg LVOT Vmax:         86.50 cm/s LVOT Vmean:        57.900 cm/s LVOT VTI:          0.255 m LVOT/AV VTI ratio: 0.92  SHUNTS Systemic VTI:  0.26 m Systemic Diam: 2.00 cm Ena Dawley MD Electronically signed by Ena Dawley MD Signature Date/Time: 11/09/2019/4:50:41 PM    Final    Structural Heart Procedure  Result Date: 11/09/2019 See surgical note for result.  CT Angio Abd/Pel w/ and/or w/o  Result Date:  11/02/2019 CLINICAL DATA:  74 year old female with history of severe aortic stenosis. Preprocedural study prior to potential transcatheter aortic valve replacement (TAVR) procedure. EXAM: CT ANGIOGRAPHY CHEST, ABDOMEN AND PELVIS TECHNIQUE: Non-contrast CT of the chest was initially obtained. Multidetector CT imaging through the chest, abdomen and pelvis was performed using the standard protocol during bolus administration of intravenous contrast. Multiplanar reconstructed images and MIPs were obtained and reviewed to evaluate the vascular anatomy. CONTRAST:  175m OMNIPAQUE IOHEXOL 350 MG/ML SOLN COMPARISON:  No prior chest CT. CT the abdomen and pelvis 03/25/2019. FINDINGS: CTA CHEST FINDINGS Cardiovascular: Heart size is mildly enlarged. There is no significant pericardial fluid, thickening or pericardial calcification. There is aortic atherosclerosis, as well as atherosclerosis of the great vessels of the mediastinum and the coronary arteries, including calcified atherosclerotic plaque in the left main coronary artery. Thickening calcification of the aortic valve. Mediastinum/Lymph Nodes: No pathologically enlarged mediastinal or hilar lymph nodes. Esophagus is unremarkable in appearance. No axillary lymphadenopathy. Lungs/Pleura: No acute consolidative airspace disease. No pleural effusions. No suspicious appearing pulmonary nodules or masses are noted. Musculoskeletal/Soft Tissues: There are no aggressive appearing lytic or blastic lesions noted in the visualized portions of the skeleton. CTA ABDOMEN AND PELVIS FINDINGS Hepatobiliary: Diffuse low attenuation throughout the hepatic parenchyma, indicative of a background of hepatic steatosis. Liver has a slightly shrunken appearance and nodular contour, indicative of underlying cirrhosis. No suspicious cystic or solid hepatic lesions. No intra or extrahepatic biliary ductal dilatation. Gallbladder is normal in appearance. Pancreas: No pancreatic mass. No  pancreatic no pancreatic ductal dilatation or peripancreatic fluid collections or inflammatory changes. Spleen: Unremarkable. Adrenals/Urinary Tract: Bilateral kidneys and adrenal glands are normal  in appearance. No hydroureteronephrosis. Urinary bladder is normal in appearance. Stomach/Bowel: Normal appearance of the stomach. No pathologic dilatation of small bowel or colon. A few scattered colonic diverticulae are noted, without surrounding inflammatory changes are noted to suggest acute diverticulitis. Normal appendix. Vascular/Lymphatic: Aortic atherosclerosis, with findings and measurements pertinent to potential TAVR procedure, as detailed below. No aneurysm or dissection noted in the abdominal or pelvic vasculature. No lymphadenopathy noted in the abdomen or pelvis. Reproductive: Uterus and ovaries are unremarkable in appearance. Other: No significant volume of ascites.  No pneumoperitoneum. Musculoskeletal: There are no aggressive appearing lytic or blastic lesions noted in the visualized portions of the skeleton. VASCULAR MEASUREMENTS PERTINENT TO TAVR: AORTA: Minimal Aortic Diameter-16 x 16 mm Severity of Aortic Calcification-mild RIGHT PELVIS: Right Common Iliac Artery - Minimal Diameter-9.6 x 9.0 mm Tortuosity-mild Calcification-mild-to-moderate Right External Iliac Artery - Minimal Diameter-7.5 x 7.8 mm Tortuosity-mild-to-moderate Calcification-none Right Common Femoral Artery - Minimal Diameter-8.5 x 7.6 mm Tortuosity-mild Calcification-none LEFT PELVIS: Left Common Iliac Artery - Minimal Diameter-9.7 x 10.3 mm Tortuosity-mild Calcification-mild Left External Iliac Artery - Minimal Diameter-7.7 x 6.9 mm Tortuosity-mild-to-moderate Calcification-none Left Common Femoral Artery - Minimal Diameter-8.3 x 7.4 mm Tortuosity-mild Calcification-mild Review of the MIP images confirms the above findings. IMPRESSION: 1. Vascular findings and measurements pertinent to potential TAVR procedure, as detailed above.  2. Severe thickening calcification of the aortic valve, compatible with the reported clinical history of severe aortic stenosis. 3. Mild cardiomegaly. 4. Aortic atherosclerosis, in addition to left main coronary artery disease. Assessment for potential risk factor modification, dietary therapy or pharmacologic therapy may be warranted, if clinically indicated. 5. Hepatic steatosis. 6. Morphologic changes in the liver suggesting early cirrhosis. 7. Colonic diverticulosis without evidence of acute diverticulitis at this time. 8. Additional incidental findings, as above. Electronically Signed   By: Vinnie Langton M.D.   On: 11/02/2019 12:10   (Echo, Carotid, EGD, Colonoscopy, ERCP)    Subjective: Patient was seen and examined.  No overnight events.  Afebrile for last 24 hours.  No more suprapubic discomfort or spasms.  Feels well and eager to go home.   Discharge Exam: Vitals:   11/12/19 2034 11/13/19 0813  BP: 131/64 (!) 154/71  Pulse: 87 80  Resp: 16 19  Temp: 98.1 F (36.7 C) 99.1 F (37.3 C)  SpO2: 100% 97%   Vitals:   11/12/19 0737 11/12/19 1634 11/12/19 2034 11/13/19 0813  BP: 139/63 (!) 153/64 131/64 (!) 154/71  Pulse: 95 86 87 80  Resp: '16 16 16 19  ' Temp: 98.2 F (36.8 C) 98.2 F (36.8 C) 98.1 F (36.7 C) 99.1 F (37.3 C)  TempSrc: Oral Oral  Oral  SpO2: 99% 100% 100% 97%  Weight:      Height:        General: Pt is alert, awake, not in acute distress Cardiovascular: RRR, S1/S2 +, no rubs, no gallops Respiratory: CTA bilaterally, no wheezing, no rhonchi Abdominal: Soft, NT, ND, bowel sounds +, obese and pendulous. Left groin incision site clean and dry, nontender. Extremities: no edema, no cyanosis    The results of significant diagnostics from this hospitalization (including imaging, microbiology, ancillary and laboratory) are listed below for reference.     Microbiology: Recent Results (from the past 240 hour(s))  Surgical pcr screen     Status: None    Collection Time: 11/05/19 11:08 AM   Specimen: Nasal Mucosa; Nasal Swab  Result Value Ref Range Status   MRSA, PCR NEGATIVE NEGATIVE Final   Staphylococcus aureus  NEGATIVE NEGATIVE Final    Comment: (NOTE) The Xpert SA Assay (FDA approved for NASAL specimens in patients 38 years of age and older), is one component of a comprehensive surveillance program. It is not intended to diagnose infection nor to guide or monitor treatment. Performed at [facility], [place] [address], [place], [place] [address]   SARS CORONAVIRUS 2 (TAT 6-24 HRS) Nasopharyngeal Nasopharyngeal Swab     Status: None   Collection Time: 11/06/19 11:05 AM   Specimen: Nasopharyngeal Swab  Result Value Ref Range Status   SARS Coronavirus 2 NEGATIVE NEGATIVE Final    Comment: (NOTE) SARS-CoV-2 target nucleic acids are NOT DETECTED.  The SARS-CoV-2 RNA is generally detectable in upper and lower respiratory specimens during the acute phase of infection. Negative results do not preclude SARS-CoV-2 infection, do not rule out co-infections with other pathogens, and should not be used as the sole basis for treatment or other patient management decisions. Negative results must be combined with clinical observations, patient history, and epidemiological information. The expected result is Negative.  Fact Sheet for Patients: SugarRoll.be  Fact Sheet for Healthcare Providers: https://www.woods-mathews.com/  This test is not yet approved or cleared by the [place] FDA and  has been authorized for detection and/or diagnosis of SARS-CoV-2 by FDA under an Emergency Use Authorization (EUA). This EUA will remain  in effect (meaning this test can be used) for the duration of the COVID-19 declaration under Se ction 564(b)(1) of the Act, 21 U.S.C. section 360bbb-3(b)(1), unless the authorization is terminated or revoked sooner.  Performed at [facility], [place] [address] [place], [place] [address]   Culture, blood (Routine x 2)     Status: None (Preliminary result)   Collection Time: 11/10/19  9:38 PM   Specimen: BLOOD  Result Value Ref Range Status   Specimen Description BLOOD LEFT ARM  Final   Special Requests   Final    BOTTLES DRAWN AEROBIC AND ANAEROBIC Blood Culture results may not be optimal due to an excessive volume of blood received in culture bottles   Culture   Final    NO GROWTH 2 DAYS Performed at [facility], [place] [address], [place], [place] [address]    Report Status PENDING  Incomplete  Culture, blood (Routine x 2)     Status: None (Preliminary result)   Collection Time: 11/10/19  9:38 PM   Specimen: BLOOD  Result Value Ref Range Status   Specimen Description BLOOD RIGHT ARM  Final   Special Requests   Final    BOTTLES DRAWN AEROBIC ONLY Blood Culture adequate volume   Culture   Final    NO GROWTH 2 DAYS Performed at [facility], [address], [place], [place] [address]    Report Status PENDING  Incomplete  SARS Coronavirus 2 by RT PCR (hospital order, performed in [facility] hospital lab) Nasopharyngeal Nasopharyngeal Swab     Status: None   Collection Time: 11/10/19 11:00 PM   Specimen: Nasopharyngeal Swab  Result Value Ref Range Status   SARS Coronavirus 2 NEGATIVE NEGATIVE Final    Comment: (NOTE) SARS-CoV-2 target nucleic acids are NOT DETECTED.  The SARS-CoV-2 RNA is generally detectable in upper and lower respiratory specimens during the acute phase of infection. The lowest concentration of SARS-CoV-2 viral copies this assay can detect is 250 copies / mL. A negative result does not preclude SARS-CoV-2 infection and should not be used as the sole basis for treatment  or other patient management decisions.  A negative result may occur with improper specimen collection / handling, submission of specimen other than nasopharyngeal swab, presence of viral mutation(s) within the areas targeted by this assay,  and inadequate number of viral copies (<250 copies / mL). A negative result must be combined with clinical observations, patient history, and epidemiological information.  Fact Sheet for Patients:   StrictlyIdeas.no  Fact Sheet for Healthcare Providers: BankingDealers.co.za  This test is not yet approved or  cleared by the [place] FDA and has been authorized for detection and/or diagnosis of SARS-CoV-2 by FDA under an Emergency Use Authorization (EUA).  This EUA will remain in effect (meaning this test can be used) for the duration of the COVID-19 declaration under Section 564(b)(1) of the Act, 21 U.S.C. section 360bbb-3(b)(1), unless the authorization is terminated or revoked sooner.  Performed at [facility], [place] [address], [place], [place] [address]   Urine culture     Status: None   Collection Time: 11/11/19  2:37 AM   Specimen: Urine, Clean Catch  Result Value Ref Range Status   Specimen Description URINE, CLEAN CATCH  Final   Special Requests NONE  Final   Culture   Final    NO GROWTH Performed at [facility], [place] [address] [place], [place] [address]    Report Status 11/12/2019 FINAL  Final     Labs: BNP (last 3 results) Recent Labs    11/05/19 1105  BNP 62.5   Basic Metabolic Panel: Recent Labs  Lab 11/09/19 1715 11/09/19 1715 11/10/19 0454 11/10/19 2138 11/11/19 0609 11/12/19 0414 11/13/19 0354  NA 141  --  138 135  --  136 139  K 4.4  --  4.5 4.0  --  4.3 4.5  CL 105  --  105 103  --  108 113*  CO2  --   --  23 20*  --  20* 18*  GLUCOSE 253*  --  182* 240*  --  202* 179*  BUN 25*  --  26* 24*  --  21 19  CREATININE 0.80   < > 1.03* 1.20* 1.19* 1.01* 0.90  CALCIUM  --   --  9.2 9.3  --  8.4* 8.6*  MG  --   --  1.9  --   --  2.0  --   PHOS  --   --   --   --   --  2.5  --    < > = values in this interval not displayed.   Liver Function Tests: Recent Labs  Lab 11/10/19 2138  11/12/19 0414  AST 45* 34  ALT 33 29  ALKPHOS 54 43  BILITOT 0.5 0.6  PROT 7.2 6.3*  ALBUMIN 3.1* 2.5*   No results for input(s): LIPASE, AMYLASE in the last 168 hours. No results for input(s): AMMONIA in the last 168 hours. CBC: Recent Labs  Lab 11/10/19 0454 11/10/19 2138 11/11/19 0609 11/12/19 0414 11/13/19 0354  WBC 4.2 6.3 6.4 5.0 4.6  NEUTROABS  --  5.2  --  3.0 2.5  HGB 9.4* 10.1* 9.6* 8.7* 8.6*  HCT 30.9* 32.2* 31.1* 28.1* 28.0*  MCV 99.0 96.7 99.0 97.2 97.6  PLT 200 216 181 164 163   Cardiac Enzymes: No results for input(s): CKTOTAL, CKMB, CKMBINDEX, TROPONINI in the last 168 hours. BNP: Invalid input(s): POCBNP CBG: Recent Labs  Lab 11/12/19 0735 11/12/19 1111 11/12/19 1632 11/12/19 2315 11/13/19 0726  GLUCAP 182*  163* 240* 197* 147*   D-Dimer No results for input(s): DDIMER in the last 72 hours. Hgb A1c No results for input(s): HGBA1C in the last 72 hours. Lipid Profile No results for input(s): CHOL, HDL, LDLCALC, TRIG, CHOLHDL, LDLDIRECT in the last 72 hours. Thyroid function studies No results for input(s): TSH, T4TOTAL, T3FREE, THYROIDAB in the last 72 hours.  Invalid input(s): FREET3 Anemia work up No results for input(s): VITAMINB12, FOLATE, FERRITIN, TIBC, IRON, RETICCTPCT in the last 72 hours. Urinalysis    Component Value Date/Time   COLORURINE YELLOW 11/10/2019 2126   APPEARANCEUR CLOUDY (A) 11/10/2019 2126   LABSPEC 1.020 11/10/2019 2126   PHURINE 5.0 11/10/2019 2126   GLUCOSEU NEGATIVE 11/10/2019 2126   HGBUR SMALL (A) 11/10/2019 2126   BILIRUBINUR NEGATIVE 11/10/2019 2126   KETONESUR NEGATIVE 11/10/2019 2126   PROTEINUR NEGATIVE 11/10/2019 2126   NITRITE NEGATIVE 11/10/2019 2126   LEUKOCYTESUR NEGATIVE 11/10/2019 2126   Sepsis Labs Invalid input(s): PROCALCITONIN,  WBC,  LACTICIDVEN Microbiology Recent Results (from the past 240 hour(s))  Surgical pcr screen     Status: None   Collection Time: 11/05/19 11:08 AM    Specimen: Nasal Mucosa; Nasal Swab  Result Value Ref Range Status   MRSA, PCR NEGATIVE NEGATIVE Final   Staphylococcus aureus NEGATIVE NEGATIVE Final    Comment: (NOTE) The Xpert SA Assay (FDA approved for NASAL specimens in patients 64 years of age and older), is one component of a comprehensive surveillance program. It is not intended to diagnose infection nor to guide or monitor treatment. Performed at [facility], [place] [address] [place], [place] [address]   SARS CORONAVIRUS 2 (TAT 6-24 HRS) Nasopharyngeal Nasopharyngeal Swab     Status: None   Collection Time: 11/06/19 11:05 AM   Specimen: Nasopharyngeal Swab  Result Value Ref Range Status   SARS Coronavirus 2 NEGATIVE NEGATIVE Final    Comment: (NOTE) SARS-CoV-2 target nucleic acids are NOT DETECTED.  The SARS-CoV-2 RNA is generally detectable in upper and lower respiratory specimens during the acute phase of infection. Negative results do not preclude SARS-CoV-2 infection, do not rule out co-infections with other pathogens, and should not be used as the sole basis for treatment or other patient management decisions. Negative results must be combined with clinical observations, patient history, and epidemiological information. The expected result is Negative.  Fact Sheet for Patients: SugarRoll.be  Fact Sheet for Healthcare Providers: https://www.woods-mathews.com/  This test is not yet approved or cleared by the [place] FDA and  has been authorized for detection and/or diagnosis of SARS-CoV-2 by FDA under an Emergency Use Authorization (EUA). This EUA will remain  in effect (meaning this test can be used) for the duration of the COVID-19 declaration under Se ction 564(b)(1) of the Act, 21 U.S.C. section 360bbb-3(b)(1), unless the authorization is terminated or revoked sooner.  Performed at [facility], [place] [address], [place], [place] [address]   Culture,  blood (Routine x 2)     Status: None (Preliminary result)   Collection Time: 11/10/19  9:38 PM   Specimen: BLOOD  Result Value Ref Range Status   Specimen Description BLOOD LEFT ARM  Final   Special Requests   Final    BOTTLES DRAWN AEROBIC AND ANAEROBIC Blood Culture results may not be optimal due to an excessive volume of blood received in culture bottles   Culture   Final    NO GROWTH 2 DAYS Performed at [facility], [place] [address]  [place], [place] [address]    Report Status PENDING  Incomplete  Culture, blood (Routine x 2)     Status: None (Preliminary result)   Collection Time: 11/10/19  9:38 PM   Specimen: BLOOD  Result Value Ref Range Status   Specimen Description BLOOD RIGHT ARM  Final   Special Requests   Final    BOTTLES DRAWN AEROBIC ONLY Blood Culture adequate volume   Culture   Final    NO GROWTH 2 DAYS Performed at [facility], [address], [place], [place] [address]    Report Status PENDING  Incomplete  SARS Coronavirus 2 by RT PCR (hospital order, performed in [facility] hospital lab) Nasopharyngeal Nasopharyngeal Swab     Status: None   Collection Time: 11/10/19 11:00 PM   Specimen: Nasopharyngeal Swab  Result Value Ref Range Status   SARS Coronavirus 2 NEGATIVE NEGATIVE Final    Comment: (NOTE) SARS-CoV-2 target nucleic acids are NOT DETECTED.  The SARS-CoV-2 RNA is generally detectable in upper and lower respiratory specimens during the acute phase of infection. The lowest concentration of SARS-CoV-2 viral copies this assay can detect is 250 copies / mL. A negative result does not preclude SARS-CoV-2 infection and should not be used as the sole basis for treatment or other patient management decisions.  A negative result may occur with improper specimen collection / handling, submission of specimen other than nasopharyngeal swab, presence of viral mutation(s) within the areas targeted by this assay, and inadequate number of viral  copies (<250 copies / mL). A negative result must be combined with clinical observations, patient history, and epidemiological information.  Fact Sheet for Patients:   StrictlyIdeas.no  Fact Sheet for Healthcare Providers: BankingDealers.co.za  This test is not yet approved or  cleared by the [place] FDA and has been authorized for detection and/or diagnosis of SARS-CoV-2 by FDA under an Emergency Use Authorization (EUA).  This EUA will remain in effect (meaning this test can be used) for the duration of the COVID-19 declaration under Section 564(b)(1) of the Act, 21 U.S.C. section 360bbb-3(b)(1), unless the authorization is terminated or revoked sooner.  Performed at [facility], [place] [address], [place], [place] [address]   Urine culture     Status: None   Collection Time: 11/11/19  2:37 AM   Specimen: Urine, Clean Catch  Result Value Ref Range Status   Specimen Description URINE, CLEAN CATCH  Final   Special Requests NONE  Final   Culture   Final    NO GROWTH Performed at [facility], [place] [address], [place],  [address]    Report Status 11/12/2019 FINAL  Final     Time coordinating discharge: 35 minutes  SIGNED:   Barb Merino, MD  [facility] 11/13/2019, 11:56 AM

## 2019-11-15 ENCOUNTER — Telehealth: Payer: Self-pay

## 2019-11-15 ENCOUNTER — Other Ambulatory Visit: Payer: Self-pay | Admitting: Physician Assistant

## 2019-11-15 DIAGNOSIS — Z952 Presence of prosthetic heart valve: Secondary | ICD-10-CM

## 2019-11-15 LAB — CULTURE, BLOOD (ROUTINE X 2)
Culture: NO GROWTH
Culture: NO GROWTH
Special Requests: ADEQUATE

## 2019-11-15 NOTE — Telephone Encounter (Signed)
Transition Care Management Follow-up Telephone Call  Date of discharge and from where: 11/13/2019 Diana Harmon  How have you been since you were released from the hospital? Much better  Any questions or concerns? No   Items Reviewed:  Did the pt receive and understand the discharge instructions provided? Yes   Medications obtained and verified? Yes   Any new allergies since your discharge? No   Dietary orders reviewed? Yes  Do you have support at home? Yes   Functional Questionnaire: (I = Independent and D = Dependent) ADLs: I  Bathing/Dressing- D  Meal Prep- D  Eating- I  Maintaining continence- I  Transferring/Ambulation- I  Managing Meds- D daughter sets up med box  Follow up appointments reviewed:   PCP Hospital f/u appt confirmed? Yes  Scheduled to see Dr. Parks Ranger  on 11/17/2019 @ 1:20.  [facility] f/u appt confirmed? Yes  Scheduled to see Angelena Form on 11/18/2019 @ 1:30.  Are transportation arrangements needed? No   If their condition worsens, is the pt aware to call PCP or go to the Emergency Dept.? Yes  Was the patient provided with contact information for the PCP's office or ED? Yes  Was to pt encouraged to call back with questions or concerns? Yes

## 2019-11-16 MED FILL — Heparin Sodium (Porcine) Inj 1000 Unit/ML: INTRAMUSCULAR | Qty: 30 | Status: AC

## 2019-11-16 MED FILL — Potassium Chloride Inj 2 mEq/ML: INTRAVENOUS | Qty: 40 | Status: AC

## 2019-11-16 MED FILL — Magnesium Sulfate Inj 50%: INTRAMUSCULAR | Qty: 10 | Status: AC

## 2019-11-16 NOTE — Progress Notes (Signed)
[facility] [place]                                       Cardiology Office Note    Date:  11/18/2019   ID:  Diana Harmon, DOB 1945/09/08, MRN [id number]  PCP:  [name], DO  Cardiologist: Dr. Saunders Revel / Dr. Angelena Form & Dr. Cyndia Bent (TAVR)  CC: [facility] s/p TAVR  History of Present Illness:  Diana Harmon is a 74 y.o. female with a history of chronic systolic CHF, RBBB, obesity, HTN, HLD, diabetes mellitus, thyroid disease, anemia on chronic iron infusions, and severe aortic stenosis s/p TAVR (76/21) who presents to clinic for follow up.   Pt has a history of aortic stenosis. A recent echocardiogram on 08/19/2019 showed an increase in the mean gradient to 32 mmHg with a peak gradient of 75 mmHg. Aortic valve area had decreased to 0.85 cm consistent with severe aortic stenosis. Left ventricular ejection fraction is 60 to 65%. She underwent cardiac catheterization on 10/19/2019 showing no evidence of coronary disease. The mean gradient by catheterization was 29 mmHg with an aortic valve area of 0.8 cm.  She was evaluated by the multidisciplinary valve team and underwent successful TAVR with a62m Edwards Sapien 3 UltraTHV via the TF approach on 11/09/19. Post operative echoshowed EF 65%, normally functioning TAVR with a mean gradient of 18 mm hg and no PVL. She was discharged on aspirin and palvix.   She was then readmitted from 7/7-7/10/21 for fever with SIRS. Primary source unknown.Work up was negative and she was treated for a UTI.   Today she presents to clinic for follow up. No CP or SOB. No LE edema, orthopnea or PND. No dizziness or syncope. No blood in stool or urine. No palpitations. Feels better and better everyday. No more fevers. So thankful to the team    Past Medical History:  Diagnosis Date  . Anemia   . Arthritis    legs, knees, back, wrists  . Diabetes mellitus without complication ([place]   .  Hypertension   . Nausea   . Patient is [name]   . S/P TAVR (transcatheter aortic valve replacement) 11/09/2019   s/p TAVR with a 26 mm Edwards S3U via the TF approach by Dr. MAngelena Form& BCyndia Bent . Severe aortic stenosis   . SIRS (systemic inflammatory response syndrome) ([place] 11/2019  . Thyroid disease     Past Surgical History:  Procedure Laterality Date  . CARDIAC CATHETERIZATION  10/19/2019  . COLON SURGERY     caudarized polyps; rectal carcinoid excision ~ 2011  . COLONOSCOPY    . COLONOSCOPY WITH PROPOFOL N/A 03/13/2015   Procedure: COLONOSCOPY WITH PROPOFOL;  Surgeon: RManya Silvas MD;  [facility]  Service: Endoscopy;  Laterality: N/A;  . ESOPHAGOGASTRODUODENOSCOPY N/A 03/13/2015   Procedure: ESOPHAGOGASTRODUODENOSCOPY (EGD);  Surgeon: RManya Silvas MD;  [facility];  Service: Endoscopy;  Laterality: N/A;  . INTRAOPERATIVE TRANSTHORACIC ECHOCARDIOGRAM N/A 11/09/2019   Procedure: INTRAOPERATIVE TRANSTHORACIC ECHOCARDIOGRAM;  Surgeon: MBurnell Blanks MD;  [place]  Service: Open Heart Surgery;  Laterality: N/A;  . PARATHYROIDECTOMY  11/20/2004   right superior parathyroidecomy for primary hyperparathyroidism/parathyroid adenoma  . RIGHT/LEFT HEART CATH AND CORONARY ANGIOGRAPHY N/A 10/19/2019   Procedure: RIGHT/LEFT HEART CATH AND CORONARY ANGIOGRAPHY;  Surgeon: ENelva Bush MD;  [facility];  Service: Cardiovascular;  Laterality: N/A;  . THYROIDECTOMY    . TRANSCATHETER AORTIC VALVE REPLACEMENT, TRANSFEMORAL N/A 11/09/2019   Procedure: TRANSCATHETER AORTIC VALVE REPLACEMENT, TRANSFEMORAL;  Surgeon: Burnell Blanks, MD;  Location: [place];  Service: Open Heart Surgery;  Laterality: N/A;    Current Medications: Outpatient Medications Prior to Visit  Medication Sig Dispense Refill  . acetaminophen (TYLENOL) 500 MG tablet Take 1,000 mg by mouth every 6 (six) hours as needed for moderate pain.     .  Artificial Tear Solution (GENTEAL TEARS) 0.1-0.2-0.3 % SOLN Place 1 drop into both eyes 3 (three) times daily as needed (dry/irritated eyes.).    Marland Kitchen aspirin 81 MG chewable tablet Chew 1 tablet (81 mg total) by mouth daily.    . B COMPLEX-C PO Take 10 mLs by mouth daily.    . baclofen (LIORESAL) 10 MG tablet TAKE 1 TO 2 TABLETS BY MOUTH TWICE DAILY AS NEEDED FOR MUSCLE SPASM 60 tablet 2  . Blood Glucose Monitoring Suppl (ACCU-CHEK NANO SMARTVIEW) w/Device KIT Use to check blood glucose up to once daily as instructed 1 kit 0  . Cholecalciferol (VITAMIN D3) 125 MCG (5000 UT) TABS Take 5,000 Units by mouth daily.    . clopidogrel (PLAVIX) 75 MG tablet Take 1 tablet (75 mg total) by mouth daily with breakfast. 90 tablet 1  . Ferrous Sulfate (IRON) 325 (65 Fe) MG TABS TAKE 1 TABLET BY MOUTH TWICE DAILY WITH A MEAL 180 each 1  . fluconazole (DIFLUCAN) 150 MG tablet Take one tablet by mouth on Day 1. Repeat dose 2nd tablet on Day 3. 2 tablet 0  . furosemide (LASIX) 20 MG tablet Take 1 tablet (20 mg total) by mouth daily as needed for edema. 45 tablet 6  . gabapentin (NEURONTIN) 100 MG capsule Take 100 mg by mouth as needed.    Marland Kitchen glucose blood (ACCU-CHEK SMARTVIEW) test strip Check blood glucose up to once daily as instructed 100 each 5  . hydrochlorothiazide (HYDRODIURIL) 25 MG tablet Take 1 tablet by mouth once daily 90 tablet 3  . lactobacillus acidophilus (BACID) TABS tablet Take 2 tablets by mouth 2 (two) times daily as needed (digestive health.).     Marland Kitchen levocetirizine (XYZAL) 5 MG tablet Take 1 tablet (5 mg total) by mouth every evening. 90 tablet 3  . losartan (COZAAR) 50 MG tablet Take 1 tablet by mouth once daily 90 tablet 0  . metFORMIN (GLUCOPHAGE) 500 MG tablet Take 1 tablet (500 mg total) by mouth daily with supper. 90 tablet 1  . mupirocin ointment (BACTROBAN) 2 % Apply 1 application topically 2 (two) times daily. 22 g 1  . simvastatin (ZOCOR) 20 MG tablet TAKE 1 TABLET BY MOUTH ONCE DAILY AT 6  PM 90 tablet 0  . gabapentin (NEURONTIN) 100 MG capsule Start 1 capsule daily, increase by 1 cap every 2-3 days as tolerated up to 3 times a day, or may take 3 at once in evening. (Patient not taking: Reported on 11/18/2019) 90 capsule 1   No facility-administered medications prior to visit.     Allergies:   Ferumoxytol, Other, and Penicillins   Social History   Socioeconomic History  . Marital status: Divorced    Spouse name: Not on file  . Number of children: 2  . Years of education: 70  . Highest education level: 12th grade  Occupational History  . Occupation: retired-worked with nandicapped adults  Tobacco Use  . Smoking status: Former Smoker  Years: 20.00    Types: Cigarettes    Quit date: 09/05/1979    Years since quitting: 40.2  . Smokeless tobacco: Former Network engineer  . Vaping Use: Never used  Substance and Sexual Activity  . Alcohol use: Yes    Alcohol/week: 0.0 - 1.0 standard drinks    Comment: occ. wine  . Drug use: No  . Sexual activity: Not on file  Other Topics Concern  . Not on file  Social History Narrative  . Not on file   Social Determinants of Health   Financial Resource Strain: Low Risk   . Difficulty of Paying Living Expenses: Not hard at all  Food Insecurity: No Food Insecurity  . Worried About Charity fundraiser in the Last Year: Never true  . Ran Out of Food in the Last Year: Never true  Transportation Needs: No Transportation Needs  . Lack of Transportation (Medical): No  . Lack of Transportation (Non-Medical): No  Physical Activity: Inactive  . Days of Exercise per Week: 0 days  . Minutes of Exercise per Session: 0 min  Stress: No Stress Concern Present  . Feeling of Stress : Not at all  Social Connections: Moderately Integrated  . Frequency of Communication with Friends and Family: More than three times a week  . Frequency of Social Gatherings with Friends and Family: Never  . Attends Religious Services: More than 4 times per  year  . Active Member of Clubs or Organizations: Yes  . Attends Archivist Meetings: More than 4 times per year  . Marital Status: Divorced     Family History:  The patient's family history includes Aneurysm in her son; Breast cancer in her maternal aunt; Heart disease in her father; Lupus in her sister and son; Parkinson's disease in her mother.     ROS:   Please see the history of present illness.    ROS All other systems reviewed and are negative.   PHYSICAL EXAM:   VS:  BP (!) 130/56   Pulse 87   Ht 5' (1.524 m)   Wt 227 lb 3.2 oz (103.1 kg)   SpO2 98%   BMI 44.37 kg/m    GEN: Well nourished, well developed, in no acute distress, obese HEENT: normal Neck: no JVD or masses Cardiac: RRR; no murmurs, rubs, or gallops,no edema  Respiratory:  clear to auscultation bilaterally, normal work of breathing GI: soft, nontender, nondistended, + BS MS: no deformity or atrophy Skin: warm and dry, no rash .  Groin sites clear without hematoma or ecchymosis  Neuro:  Alert and Oriented x 3, Strength and sensation are intact Psych: euthymic mood, full affect   Wt Readings from Last 3 Encounters:  11/18/19 227 lb 3.2 oz (103.1 kg)  11/17/19 226 lb 12.8 oz (102.9 kg)  11/11/19 232 lb 2.3 oz (105.3 kg)      Studies/Labs Reviewed:   EKG:  EKG is ordered today.  The ekg ordered today demonstrates sinus with old RBBB HR 87  Recent Labs: 11/05/2019: B Natriuretic Peptide 22.9 11/12/2019: ALT 29; Magnesium 2.0 11/13/2019: BUN 19; Creatinine, Ser 0.90; Hemoglobin 8.6; Platelets 163; Potassium 4.5; Sodium 139   Lipid Panel    Component Value Date/Time   CHOL 163 07/21/2017 0000   TRIG 95 07/21/2017 0000   HDL 68 07/21/2017 0000   CHOLHDL 2.4 07/21/2017 0000   VLDL 26 06/03/2016 0001   LDLCALC 77 07/21/2017 0000    Additional studies/ records that were reviewed  today include:  TAVR OPERATIVE NOTE   Date of Procedure:11/09/2019  Preoperative  Diagnosis:Severe Aortic Stenosis   Postoperative Diagnosis:Same   Procedure:   Transcatheter Aortic Valve Replacement - PercutaneousRightTransfemoral Approach Edwards Sapien 3 Ultra THV (size 67m, model # 9750TFX, serial # [id number]  Co-Surgeons:Bryan KAlveria Apley MD / CLauree Chandler MD   Anesthesiologist:R. ERoanna Banning MD  EAnne Hahn MD  Pre-operative Echo Findings: ? Severe aortic stenosis ? Normalleft ventricular systolic function  Post-operative Echo Findings: ? NOparavalvular leak ? Normalleft ventricular systolic function  _____________   Echo 11/10/19:  IMPRESSIONS  1. Day 1 post TAVR, no paravalvular leak. Upper normal transaortic  gradients with peak/mean 37/18 mmHg.  2. Left ventricular ejection fraction, by estimation, is 65 to 70%. The  left ventricle has normal function. The left ventricle has no regional  wall motion abnormalities. Left ventricular diastolic parameters are  consistent with Grade I diastolic dysfunction (impaired relaxation).  3. Right ventricular systolic function is normal. The right ventricular  size is normal. There is normal pulmonary artery systolic pressure. The  estimated right ventricular systolic pressure is 274.9mmHg.  4. The mitral valve is normal in structure. No evidence of mitral valve  regurgitation. No evidence of mitral stenosis.  5. The aortic valve has been repaired/replaced. Aortic valve  regurgitation is not visualized. No aortic stenosis is present. Procedure  Date: 11/09/2019. Echo findings are consistent with normal structure and  function of the aortic valve prosthesis. Aortic  valve mean gradient measures 18.0 mmHg.  6. The inferior vena cava is normal in size with greater than 50%  respiratory variability, suggesting right atrial pressure of 3 mmHg.    ASSESSMENT & PLAN:    Severe AS s/p TAVR: groin sites healing well. ECG with sinus and old RBBB. SBE prophylaxis discussed; I have RX'd keflex due to a non anaphylactic PCN allergy. Continue on aspirin and plavix. I will see her back in a few weeks for 1 month follow up with echo.   HTN: BP well controlled. No changes made.   Chronic anemia: [name]. Continue iron at home and iron infusions with Dr. BBurlene Arnt  Medication Adjustments/Labs and Tests Ordered: Current medicines are reviewed at length with the patient today.  Concerns regarding medicines are outlined above.  Medication changes, Labs and Tests ordered today are listed in the Patient Instructions below. Patient Instructions  Medication Instructions:  Your physician has recommended you make the following change in your medication:  1.) cephalexin (Keflex) 500 mg tablets --take FOUR tablets (2000 mg) ONE HOUR PRIOR TO ANY DENTAL PROCEDURES OR CLEANINGS  *If you need a refill on your cardiac medications before your next appointment, please call your pharmacy*   Lab Work: NONE If you have labs (blood work) drawn today and your tests are completely normal, you will receive your results only by: .Marland KitchenMyChart Message (if you have MyChart) OR . A paper copy in the mail If you have any lab test that is abnormal or we need to change your treatment, we will call you to review the results.   Testing/Procedures: As scheduled --echo   Follow-Up: As planned  Other Instructions      Signed, KAngelena Form PA-C  11/18/2019 3:17 PM    [place]  [id number]: [phone] Fax: [phone]

## 2019-11-17 ENCOUNTER — Other Ambulatory Visit: Payer: Self-pay

## 2019-11-17 ENCOUNTER — Encounter: Payer: Self-pay | Admitting: Family Medicine

## 2019-11-17 ENCOUNTER — Ambulatory Visit (INDEPENDENT_AMBULATORY_CARE_PROVIDER_SITE_OTHER): Payer: Medicare Other | Admitting: Family Medicine

## 2019-11-17 VITALS — BP 133/55 | HR 91 | Temp 99.3°F | Resp 16 | Ht 60.0 in | Wt 226.8 lb

## 2019-11-17 DIAGNOSIS — R6510 Systemic inflammatory response syndrome (SIRS) of non-infectious origin without acute organ dysfunction: Secondary | ICD-10-CM

## 2019-11-17 DIAGNOSIS — T3695XA Adverse effect of unspecified systemic antibiotic, initial encounter: Secondary | ICD-10-CM

## 2019-11-17 DIAGNOSIS — B379 Candidiasis, unspecified: Secondary | ICD-10-CM | POA: Diagnosis not present

## 2019-11-17 DIAGNOSIS — L989 Disorder of the skin and subcutaneous tissue, unspecified: Secondary | ICD-10-CM

## 2019-11-17 DIAGNOSIS — Z952 Presence of prosthetic heart valve: Secondary | ICD-10-CM

## 2019-11-17 MED ORDER — MUPIROCIN 2 % EX OINT: 1.0000 "application " | TOPICAL_OINTMENT | Freq: Two times a day (BID) | CUTANEOUS | 1 refills | Status: DC

## 2019-11-17 MED ORDER — FLUCONAZOLE 150 MG PO TABS: ORAL_TABLET | ORAL | 0 refills | Status: DC

## 2019-11-17 NOTE — Patient Instructions (Addendum)
Thank you for coming to the office today.  Keep up the good work. Keep up with Cardiologist.  Re order Mupirocin and start Diflucan (fluconazole) yeast pill take 1 then skip 1 day then take last pill.  Send me a detailed mychart message when ready for glucometer info - brand (accu-chek or onetouch) - type or name of meter, and name of the test strip / lancet - will order quantity to test twice a day, around 200 strips  Check with insurance to see which one is preferred or covered and if it is the right one for you  Last one you had was Accu-chek [employer]  Please schedule a Follow-up Appointment to: Return in about 3 months (around 02/17/2020) for 3 month DM A1c Foot Exam.  If you have any other questions or concerns, please feel free to call the office or send a message through [place]. You may also schedule an earlier appointment if necessary.  Additionally, you may be receiving a survey about your experience at our office within a few days to 1 week by e-mail or mail. We value your feedback.  [name], DO [place]

## 2019-11-17 NOTE — Progress Notes (Signed)
Subjective:    Patient ID: Diana Harmon, female    DOB: 1945-10-23, 74 y.o.   MRN: [id number]  Diana Harmon is a 74 y.o. female presenting on 11/17/2019 for Hospitalization Follow-up (fever after surgery)   HPI  HOSPITAL FOLLOW-UP VISIT  Hospital/Location: [facility] Date of Admission: 11/10/19 Date of Discharge: 11/13/19 Transitions of care telephone call: Kellie Simmering LPN completed transition of care phone call on 11/15/19.  Reason for Admission: Fever Primary (+Secondary) Diagnosis: post-op fever, s/p TAVR  - Hospital H&P and Discharge Summary have been reviewed - Patient presents today 4 days  after recent hospitalization. Brief summary of recent course, patient had symptoms of fever 89F with abdominal pain and nausea, 1 day after TAVR, hospitalized, treated for post op fever. Evaluation was completed with blood cultures and urine testing were negative, she was treated empirically since no obvious source of fever identified, she had IV antibiotics, transitioned to oral antibiotic. She had scans CT chest abdomen without cause of fever.  - Today reports overall has done well after discharge. Symptoms of fever have resolved  - She feels symptoms have improved overall each day. - She has been on oral antibiotics. Last dose yesterday. Now recently noticed some urinary itching. possible yeast infection.   I have reviewed the discharge medication list, and have reconciled the current and discharge medications today.   Current Outpatient Medications:  .  acetaminophen (TYLENOL) 500 MG tablet, Take 1,000 mg by mouth every 6 (six) hours as needed for moderate pain. , Disp: , Rfl:  .  Artificial Tear Solution (GENTEAL TEARS) 0.1-0.2-0.3 % SOLN, Place 1 drop into both eyes 3 (three) times daily as needed (dry/irritated eyes.)., Disp: , Rfl:  .  aspirin 81 MG chewable tablet, Chew 1 tablet (81 mg total) by mouth daily., Disp: , Rfl:  .  B COMPLEX-C PO, Take 10 mLs by mouth  daily., Disp: , Rfl:  .  baclofen (LIORESAL) 10 MG tablet, TAKE 1 TO 2 TABLETS BY MOUTH TWICE DAILY AS NEEDED FOR MUSCLE SPASM (Patient taking differently: Take 10-20 mg by mouth 2 (two) times daily as needed for muscle spasms. ), Disp: 60 tablet, Rfl: 2 .  Blood Glucose Monitoring Suppl (ACCU-CHEK NANO SMARTVIEW) w/Device KIT, Use to check blood glucose up to once daily as instructed, Disp: 1 kit, Rfl: 0 .  Cholecalciferol (VITAMIN D3) 125 MCG (5000 UT) TABS, Take 5,000 Units by mouth daily., Disp: , Rfl:  .  clopidogrel (PLAVIX) 75 MG tablet, Take 1 tablet (75 mg total) by mouth daily with breakfast., Disp: 90 tablet, Rfl: 1 .  Ferrous Sulfate (IRON) 325 (65 Fe) MG TABS, TAKE 1 TABLET BY MOUTH TWICE DAILY WITH A MEAL (Patient taking differently: Take 325 mg by mouth in the morning and at bedtime. ), Disp: 180 each, Rfl: 1 .  furosemide (LASIX) 20 MG tablet, Take 1 tablet (20 mg total) by mouth daily as needed for edema. (Patient taking differently: Take 20 mg by mouth daily as needed for edema (fluid retention). ), Disp: 45 tablet, Rfl: 6 .  gabapentin (NEURONTIN) 100 MG capsule, Start 1 capsule daily, increase by 1 cap every 2-3 days as tolerated up to 3 times a day, or may take 3 at once in evening. (Patient taking differently: Take 100-300 mg by mouth 3 (three) times daily as needed (shingles pain.). ), Disp: 90 capsule, Rfl: 1 .  glucose blood (ACCU-CHEK SMARTVIEW) test strip, Check blood glucose up to once daily as instructed, Disp:  100 each, Rfl: 5 .  hydrochlorothiazide (HYDRODIURIL) 25 MG tablet, Take 1 tablet by mouth once daily (Patient taking differently: Take 25 mg by mouth daily. ), Disp: 90 tablet, Rfl: 3 .  lactobacillus acidophilus (BACID) TABS tablet, Take 2 tablets by mouth 2 (two) times daily as needed (digestive health.). , Disp: , Rfl:  .  levocetirizine (XYZAL) 5 MG tablet, Take 1 tablet (5 mg total) by mouth every evening. (Patient taking differently: Take 5 mg by mouth daily as  needed (allergies.). ), Disp: 90 tablet, Rfl: 3 .  losartan (COZAAR) 50 MG tablet, Take 1 tablet by mouth once daily (Patient taking differently: Take 50 mg by mouth daily. ), Disp: 90 tablet, Rfl: 0 .  metFORMIN (GLUCOPHAGE) 500 MG tablet, Take 1 tablet (500 mg total) by mouth daily with supper., Disp: 90 tablet, Rfl: 1 .  mupirocin ointment (BACTROBAN) 2 %, Apply 1 application topically 2 (two) times daily., Disp: 22 g, Rfl: 1 .  simvastatin (ZOCOR) 20 MG tablet, TAKE 1 TABLET BY MOUTH ONCE DAILY AT 6 PM (Patient taking differently: Take 20 mg by mouth every evening. ), Disp: 90 tablet, Rfl: 0 .  fluconazole (DIFLUCAN) 150 MG tablet, Take one tablet by mouth on Day 1. Repeat dose 2nd tablet on Day 3., Disp: 2 tablet, Rfl: 0  ------------------------------------------------------------------------- Social History   Tobacco Use  . Smoking status: Former Smoker    Years: 20.00    Types: Cigarettes    Quit date: 09/05/1979    Years since quitting: 40.2  . Smokeless tobacco: Former Network engineer  . Vaping Use: Never used  Substance Use Topics  . Alcohol use: Yes    Alcohol/week: 0.0 - 1.0 standard drinks    Comment: occ. wine  . Drug use: No    Review of Systems Per HPI unless specifically indicated above     Objective:    BP (!) 133/55   Pulse 91   Temp 99.3 F (37.4 C) (Oral)   Resp 16   Ht 5' (1.524 m)   Wt 226 lb 12.8 oz (102.9 kg)   SpO2 99%   BMI 44.29 kg/m   Wt Readings from Last 3 Encounters:  11/17/19 226 lb 12.8 oz (102.9 kg)  11/11/19 232 lb 2.3 oz (105.3 kg)  11/10/19 232 lb 2.3 oz (105.3 kg)    Physical Exam Vitals and nursing note reviewed.  Constitutional:      General: She is not in acute distress.    Appearance: She is well-developed. She is obese. She is not diaphoretic.     Comments: Well-appearing, comfortable, cooperative  HENT:     Head: Normocephalic and atraumatic.  Eyes:     General:        Right eye: No discharge.        Left eye: No  discharge.     Conjunctiva/sclera: Conjunctivae normal.  Neck:     Thyroid: No thyromegaly.  Cardiovascular:     Rate and Rhythm: Normal rate and regular rhythm.     Heart sounds: Murmur (flow murmur with valve repair) heard.   Pulmonary:     Effort: Pulmonary effort is normal. No respiratory distress.     Breath sounds: Normal breath sounds. No wheezing or rales.  Musculoskeletal:        General: Normal range of motion.     Cervical back: Normal range of motion and neck supple.  Lymphadenopathy:     Cervical: No cervical adenopathy.  Skin:  General: Skin is warm and dry.     Findings: No erythema or rash.  Neurological:     Mental Status: She is alert and oriented to person, place, and time.  Psychiatric:        Behavior: Behavior normal.     Comments: Well groomed, good eye contact, normal speech and thoughts       Results for orders placed or performed during the hospital encounter of 11/10/19  Culture, blood (Routine x 2)   Specimen: BLOOD  Result Value Ref Range   Specimen Description BLOOD LEFT ARM    Special Requests      BOTTLES DRAWN AEROBIC AND ANAEROBIC Blood Culture results may not be optimal due to an excessive volume of blood received in culture bottles   Culture      NO GROWTH 5 DAYS Performed at [facility] [address], [place], [place] [address]    Report Status 11/15/2019 FINAL   Culture, blood (Routine x 2)   Specimen: BLOOD  Result Value Ref Range   Specimen Description BLOOD RIGHT ARM    Special Requests      BOTTLES DRAWN AEROBIC ONLY Blood Culture adequate volume   Culture      NO GROWTH 5 DAYS Performed at [facility], [place] [address], [place], [place] [address]    Report Status 11/15/2019 FINAL   Urine culture   Specimen: Urine, Clean Catch  Result Value Ref Range   Specimen Description URINE, CLEAN CATCH    Special Requests NONE    Culture      NO GROWTH Performed at [facility], [place] [address],  [place], [place] [address]    Report Status 11/12/2019 FINAL   SARS Coronavirus 2 by RT PCR (hospital order, performed in [facility] hospital lab) Nasopharyngeal Nasopharyngeal Swab   Specimen: Nasopharyngeal Swab  Result Value Ref Range   SARS Coronavirus 2 NEGATIVE NEGATIVE  Comprehensive metabolic panel  Result Value Ref Range   Sodium 135 135 - 145 mmol/L   Potassium 4.0 3.5 - 5.1 mmol/L   Chloride 103 98 - 111 mmol/L   CO2 20 (L) 22 - 32 mmol/L   Glucose, Bld 240 (H) 70 - 99 mg/dL   BUN 24 (H) 8 - 23 mg/dL   Creatinine, Ser 1.20 (H) 0.44 - 1.00 mg/dL   Calcium 9.3 8.9 - 10.3 mg/dL   Total Protein 7.2 6.5 - 8.1 g/dL   Albumin 3.1 (L) 3.5 - 5.0 g/dL   AST 45 (H) 15 - 41 U/L   ALT 33 0 - 44 U/L   Alkaline Phosphatase 54 38 - 126 U/L   Total Bilirubin 0.5 0.3 - 1.2 mg/dL   GFR calc non Af Amer 45 (L) >60 mL/min   GFR calc Af Amer 52 (L) >60 mL/min   Anion gap 12 5 - 15  Lactic acid, plasma  Result Value Ref Range   Lactic Acid, Venous 1.8 0.5 - 1.9 mmol/L  Lactic acid, plasma  Result Value Ref Range   Lactic Acid, Venous 1.1 0.5 - 1.9 mmol/L  CBC with Differential  Result Value Ref Range   WBC 6.3 4.0 - 10.5 K/uL   RBC 3.33 (L) 3.87 - 5.11 MIL/uL   Hemoglobin 10.1 (L) 12.0 - 15.0 g/dL   HCT 32.2 (L) 36 - 46 %   MCV 96.7 80.0 - 100.0 fL   MCH 30.3 26.0 - 34.0 pg   MCHC 31.4 30.0 - 36.0 g/dL  RDW 14.5 11.5 - 15.5 %   Platelets 216 150 - 400 K/uL   nRBC 0.0 0.0 - 0.2 %   Neutrophils Relative % 82 %   Neutro Abs 5.2 1.7 - 7.7 K/uL   Lymphocytes Relative 11 %   Lymphs Abs 0.7 0.7 - 4.0 K/uL   Monocytes Relative 6 %   Monocytes Absolute 0.4 0 - 1 K/uL   Eosinophils Relative 0 %   Eosinophils Absolute 0.0 0 - 0 K/uL   Basophils Relative 0 %   Basophils Absolute 0.0 0 - 0 K/uL   Immature Granulocytes 1 %   Abs Immature Granulocytes 0.03 0.00 - 0.07 K/uL  Protime-INR  Result Value Ref Range   Prothrombin Time 13.7 11.4 - 15.2 seconds   INR 1.1 0.8 - 1.2  Urinalysis,  Routine w reflex microscopic  Result Value Ref Range   Color, Urine YELLOW YELLOW   APPearance CLOUDY (A) CLEAR   Specific Gravity, Urine 1.020 1.005 - 1.030   pH 5.0 5.0 - 8.0   Glucose, UA NEGATIVE NEGATIVE mg/dL   Hgb urine dipstick SMALL (A) NEGATIVE   Bilirubin Urine NEGATIVE NEGATIVE   Ketones, ur NEGATIVE NEGATIVE mg/dL   Protein, ur NEGATIVE NEGATIVE mg/dL   Nitrite NEGATIVE NEGATIVE   Leukocytes,Ua NEGATIVE NEGATIVE   WBC, UA 6-10 0 - 5 WBC/hpf   Bacteria, UA NONE SEEN NONE SEEN   Squamous Epithelial / LPF 11-20 0 - 5   Mucus PRESENT   APTT  Result Value Ref Range   aPTT 30 24 - 36 seconds  CBC  Result Value Ref Range   WBC 6.4 4.0 - 10.5 K/uL   RBC 3.14 (L) 3.87 - 5.11 MIL/uL   Hemoglobin 9.6 (L) 12.0 - 15.0 g/dL   HCT 31.1 (L) 36 - 46 %   MCV 99.0 80.0 - 100.0 fL   MCH 30.6 26.0 - 34.0 pg   MCHC 30.9 30.0 - 36.0 g/dL   RDW 14.7 11.5 - 15.5 %   Platelets 181 150 - 400 K/uL   nRBC 0.0 0.0 - 0.2 %  Creatinine, serum  Result Value Ref Range   Creatinine, Ser 1.19 (H) 0.44 - 1.00 mg/dL   GFR calc non Af Amer 45 (L) >60 mL/min   GFR calc Af Amer 52 (L) >60 mL/min  Glucose, capillary  Result Value Ref Range   Glucose-Capillary 221 (H) 70 - 99 mg/dL  Glucose, capillary  Result Value Ref Range   Glucose-Capillary 224 (H) 70 - 99 mg/dL  CBC with Differential/Platelet  Result Value Ref Range   WBC 5.0 4.0 - 10.5 K/uL   RBC 2.89 (L) 3.87 - 5.11 MIL/uL   Hemoglobin 8.7 (L) 12.0 - 15.0 g/dL   HCT 28.1 (L) 36 - 46 %   MCV 97.2 80.0 - 100.0 fL   MCH 30.1 26.0 - 34.0 pg   MCHC 31.0 30.0 - 36.0 g/dL   RDW 14.6 11.5 - 15.5 %   Platelets 164 150 - 400 K/uL   nRBC 0.0 0.0 - 0.2 %   Neutrophils Relative % 61 %   Neutro Abs 3.0 1.7 - 7.7 K/uL   Lymphocytes Relative 27 %   Lymphs Abs 1.3 0.7 - 4.0 K/uL   Monocytes Relative 11 %   Monocytes Absolute 0.6 0 - 1 K/uL   Eosinophils Relative 0 %   Eosinophils Absolute 0.0 0 - 0 K/uL   Basophils Relative 0 %   Basophils  Absolute 0.0 0 - 0  K/uL   Immature Granulocytes 1 %   Abs Immature Granulocytes 0.03 0.00 - 0.07 K/uL  Comprehensive metabolic panel  Result Value Ref Range   Sodium 136 135 - 145 mmol/L   Potassium 4.3 3.5 - 5.1 mmol/L   Chloride 108 98 - 111 mmol/L   CO2 20 (L) 22 - 32 mmol/L   Glucose, Bld 202 (H) 70 - 99 mg/dL   BUN 21 8 - 23 mg/dL   Creatinine, Ser 1.01 (H) 0.44 - 1.00 mg/dL   Calcium 8.4 (L) 8.9 - 10.3 mg/dL   Total Protein 6.3 (L) 6.5 - 8.1 g/dL   Albumin 2.5 (L) 3.5 - 5.0 g/dL   AST 34 15 - 41 U/L   ALT 29 0 - 44 U/L   Alkaline Phosphatase 43 38 - 126 U/L   Total Bilirubin 0.6 0.3 - 1.2 mg/dL   GFR calc non Af Amer 55 (L) >60 mL/min   GFR calc Af Amer >60 >60 mL/min   Anion gap 8 5 - 15  Magnesium  Result Value Ref Range   Magnesium 2.0 1.7 - 2.4 mg/dL  Phosphorus  Result Value Ref Range   Phosphorus 2.5 2.5 - 4.6 mg/dL  Glucose, capillary  Result Value Ref Range   Glucose-Capillary 222 (H) 70 - 99 mg/dL  Glucose, capillary  Result Value Ref Range   Glucose-Capillary 182 (H) 70 - 99 mg/dL  Glucose, capillary  Result Value Ref Range   Glucose-Capillary 163 (H) 70 - 99 mg/dL  Glucose, capillary  Result Value Ref Range   Glucose-Capillary 240 (H) 70 - 99 mg/dL  CBC with Differential/Platelet  Result Value Ref Range   WBC 4.6 4.0 - 10.5 K/uL   RBC 2.87 (L) 3.87 - 5.11 MIL/uL   Hemoglobin 8.6 (L) 12.0 - 15.0 g/dL   HCT 28.0 (L) 36 - 46 %   MCV 97.6 80.0 - 100.0 fL   MCH 30.0 26.0 - 34.0 pg   MCHC 30.7 30.0 - 36.0 g/dL   RDW 14.5 11.5 - 15.5 %   Platelets 163 150 - 400 K/uL   nRBC 0.0 0.0 - 0.2 %   Neutrophils Relative % 54 %   Neutro Abs 2.5 1.7 - 7.7 K/uL   Lymphocytes Relative 27 %   Lymphs Abs 1.2 0.7 - 4.0 K/uL   Monocytes Relative 15 %   Monocytes Absolute 0.7 0 - 1 K/uL   Eosinophils Relative 4 %   Eosinophils Absolute 0.2 0 - 0 K/uL   Basophils Relative 0 %   Basophils Absolute 0.0 0 - 0 K/uL   Immature Granulocytes 0 %   Abs Immature  Granulocytes 0.02 0.00 - 0.07 K/uL  Basic metabolic panel  Result Value Ref Range   Sodium 139 135 - 145 mmol/L   Potassium 4.5 3.5 - 5.1 mmol/L   Chloride 113 (H) 98 - 111 mmol/L   CO2 18 (L) 22 - 32 mmol/L   Glucose, Bld 179 (H) 70 - 99 mg/dL   BUN 19 8 - 23 mg/dL   Creatinine, Ser 0.90 0.44 - 1.00 mg/dL   Calcium 8.6 (L) 8.9 - 10.3 mg/dL   GFR calc non Af Amer >60 >60 mL/min   GFR calc Af Amer >60 >60 mL/min   Anion gap 8 5 - 15  Glucose, capillary  Result Value Ref Range   Glucose-Capillary 197 (H) 70 - 99 mg/dL  Glucose, capillary  Result Value Ref Range   Glucose-Capillary 147 (H) 70 - 99 mg/dL  CBG monitoring, ED  Result Value Ref Range   Glucose-Capillary 196 (H) 70 - 99 mg/dL      Assessment & Plan:   Problem List Items Addressed This Visit    SIRS (systemic inflammatory response syndrome) (HCC) - Primary   S/P TAVR (transcatheter aortic valve replacement)    Other Visit Diagnoses    Antibiotic-induced yeast infection       Relevant Medications   fluconazole (DIFLUCAN) 150 MG tablet   mupirocin ointment (BACTROBAN) 2 %   Skin sore       Relevant Medications   mupirocin ointment (BACTROBAN) 2 %      #SIRS / Post-op Fever - RESOLVED Uncertain etiology Treated empirically after extensive work up, has complete antibiotic course Secondary antibiotic induced yeast infection - will rx Diflucan today for symptoms of itching  S/p TAVR Hemodynamically stable and clinically doing well post-op. Has f/u with Cardiology tomorrow.  She will need new re order Accu-chek smartview test strips and lancets. Will wait to hear which type of glucometer / strips / lancets preferred by ins and covered, can order once I hear back from patient   Meds ordered this encounter  Medications  . fluconazole (DIFLUCAN) 150 MG tablet    Sig: Take one tablet by mouth on Day 1. Repeat dose 2nd tablet on Day 3.    Dispense:  2 tablet    Refill:  0  . mupirocin ointment (BACTROBAN) 2 %     Sig: Apply 1 application topically 2 (two) times daily.    Dispense:  22 g    Refill:  1    Follow up plan: Return in about 3 months (around 02/17/2020) for 3 month DM A1c Foot Exam.  LOS 99496 = within 7 days of discharge   [name], [facility] 11/17/2019, 1:42 PM

## 2019-11-18 ENCOUNTER — Encounter: Payer: Self-pay | Admitting: Physician Assistant

## 2019-11-18 ENCOUNTER — Ambulatory Visit: Payer: Medicare Other | Admitting: Physician Assistant

## 2019-11-18 VITALS — BP 130/56 | HR 87 | Ht 60.0 in | Wt 227.2 lb

## 2019-11-18 DIAGNOSIS — I10 Essential (primary) hypertension: Secondary | ICD-10-CM

## 2019-11-18 DIAGNOSIS — Z952 Presence of prosthetic heart valve: Secondary | ICD-10-CM | POA: Diagnosis not present

## 2019-11-18 DIAGNOSIS — E1169 Type 2 diabetes mellitus with other specified complication: Secondary | ICD-10-CM

## 2019-11-18 DIAGNOSIS — D6489 Other specified anemias: Secondary | ICD-10-CM

## 2019-11-18 MED ORDER — CEPHALEXIN 500 MG PO CAPS: ORAL_CAPSULE | ORAL | 1 refills | Status: DC

## 2019-11-18 MED ORDER — ACCU-CHEK GUIDE W/DEVICE KIT: PACK | 0 refills | Status: DC

## 2019-11-18 MED ORDER — ACCU-CHEK GUIDE VI STRP: ORAL_STRIP | 12 refills | Status: DC

## 2019-11-18 MED ORDER — ACCU-CHEK SOFTCLIX LANCETS MISC: 12 refills | Status: DC

## 2019-11-18 NOTE — Patient Instructions (Signed)
Medication Instructions:  Your physician has recommended you make the following change in your medication:  1.) cephalexin (Keflex) 500 mg tablets --take FOUR tablets (2000 mg) ONE HOUR PRIOR TO ANY DENTAL PROCEDURES OR CLEANINGS  *If you need a refill on your cardiac medications before your next appointment, please call your pharmacy*   Lab Work: NONE If you have labs (blood work) drawn today and your tests are completely normal, you will receive your results only by: Marland Kitchen MyChart Message (if you have MyChart) OR . A paper copy in the mail If you have any lab test that is abnormal or we need to change your treatment, we will call you to review the results.   Testing/Procedures: As scheduled --echo   Follow-Up: As planned  Other Instructions

## 2019-12-03 ENCOUNTER — Other Ambulatory Visit: Payer: Self-pay | Admitting: Family Medicine

## 2019-12-03 DIAGNOSIS — E1169 Type 2 diabetes mellitus with other specified complication: Secondary | ICD-10-CM

## 2019-12-09 ENCOUNTER — Other Ambulatory Visit: Payer: Self-pay | Admitting: Internal Medicine

## 2019-12-11 DIAGNOSIS — E119 Type 2 diabetes mellitus without complications: Secondary | ICD-10-CM | POA: Diagnosis not present

## 2019-12-16 ENCOUNTER — Ambulatory Visit (INDEPENDENT_AMBULATORY_CARE_PROVIDER_SITE_OTHER): Payer: Medicare Other | Admitting: General Practice

## 2019-12-16 ENCOUNTER — Telehealth: Payer: Self-pay | Admitting: General Practice

## 2019-12-16 DIAGNOSIS — E785 Hyperlipidemia, unspecified: Secondary | ICD-10-CM | POA: Diagnosis not present

## 2019-12-16 DIAGNOSIS — E1169 Type 2 diabetes mellitus with other specified complication: Secondary | ICD-10-CM | POA: Diagnosis not present

## 2019-12-16 DIAGNOSIS — I5032 Chronic diastolic (congestive) heart failure: Secondary | ICD-10-CM | POA: Diagnosis not present

## 2019-12-16 DIAGNOSIS — I10 Essential (primary) hypertension: Secondary | ICD-10-CM | POA: Diagnosis not present

## 2019-12-16 DIAGNOSIS — Z952 Presence of prosthetic heart valve: Secondary | ICD-10-CM

## 2019-12-16 DIAGNOSIS — N182 Chronic kidney disease, stage 2 (mild): Secondary | ICD-10-CM | POA: Diagnosis not present

## 2019-12-16 NOTE — Patient Instructions (Signed)
Visit Information  Goals Addressed              This Visit's Progress   .  RNCM: Pt-"I usually take my blood pressure daily" (pt-stated)        CARE PLAN ENTRY (see longtitudinal plan of care for additional care plan information)  Current Barriers:  . Chronic Disease Management support, education, and care coordination needs related to HTN, HLD, DMII, and CKD Stage 2  Clinical Goal(s) related to HTN, HLD, DMII, and CKD Stage 2 :  Over the next 120 days, patient will:  . Work with the care management team to address educational, disease management, and care coordination needs  . Begin or continue self health monitoring activities as directed today Measure and record cbg (blood glucose) 3 times a week (last hemoglobin A1C 6.0), Measure and record blood pressure 3 times per week, and adhere to a heart healthy/ADA diet . Call provider office for new or worsened signs and symptoms Blood glucose findings outside established parameters, Blood pressure findings outside established parameters, Oxygen saturation lower than established parameter, Shortness of breath, and New or worsened symptom related to HLD, CKD2,  and other chronic conditions . Call care management team with questions or concerns . Verbalize basic understanding of patient centered plan of care established today  Interventions related to HTN, HLD, DMII, and CKD Stage 2 :  . Evaluation of current treatment plans and patient's adherence to plan as established by provider.  12/16/2019: The patient is post TAVR and is doing well. The patient states she is feeling so much better, has more energy, and has made positive changes in her dietary habits. She was going to work with PT but due to the increase in [place] cases she has decided against that right now. She is however doing her own exercises at home and feels she is doing well.  . Assessed patient understanding of disease states.  The patient has a good understanding of her conditions.  12-16-2019: The patient states that her proceudre went well. Her blood pressures and blood sugars are stable. Her post TAVR EF% is 65%.  Her weight today was 224.  She says she is noticing she has more energy. She says her daughter is not ready to give her the car keys yet but she feels she is progressing well. Has a follow up with specialist on 12-20-2019.  . Assessed patient's education and care coordination needs.  12-16-2019: Education on safety with rise in [place] cases. The patient has been staying at home and limiting contact with others. Education about not getting outside when it is too hot. The patient has made positive changes in lifestyle modifications to help her health and well being.  Praised for accomplishments.   . Provided disease specific education to patient.  12-16-2019: Review of heart healthy/ADA diet.  The patient endorses following a low sodium, low carb diet. She is enjoying fresh vegetables from the garden.  Nash Dimmer with appropriate clinical care team members regarding patient needs.  Patient denies any needs from the LCSW or pharmacist. Will continue to monitor. She knows CCM team is available to assist.  . Evaluation of upcoming appointments. Sees the specialist next week on 12-20-2019. Will see pcp as needed. Last visit with pcp on 7-14-202.  Patient Self Care Activities related to HTN, HLD, DMII, and CKD Stage 2 :  . Patient is unable to independently self-manage chronic health conditions  Please see past updates related to this goal by clicking  on the "Past Updates" button in the selected goal         Patient verbalizes understanding of instructions provided today.   Telephone follow up appointment with care management team member scheduled for: 02-17-2020 at 78 pm  Noreene Larsson RN, MSN, [facility] Mobile: [phone]

## 2019-12-16 NOTE — Chronic Care Management (AMB) (Signed)
Chronic Care Management   Follow Up Note   12/16/2019 Name: Diana Harmon MRN: [id number] DOB: 1945-11-09  Referred by: [name], DO Reason for referral : Chronic Care Management (Follow up RNCM Chronic Disease Managment and Care Coordination Needs)   Diana Harmon is a 74 y.o. year old female who is a primary care patient of [name], DO. The CCM team was consulted for assistance with chronic disease management and care coordination needs.    Review of patient status, including review of consultants reports, relevant laboratory and other test results, and collaboration with appropriate care team members and the patient's provider was performed as part of comprehensive patient evaluation and provision of chronic care management services.    SDOH (Social Determinants of Health) assessments performed: Yes See Care Plan activities for detailed interventions related to [facility])     Outpatient Encounter Medications as of 12/16/2019  Medication Sig   ACCU-CHEK GUIDE test strip Use to check blood sugar twice daily   Accu-Chek Softclix Lancets lancets Use to check blood sugar twice daily   acetaminophen (TYLENOL) 500 MG tablet Take 1,000 mg by mouth every 6 (six) hours as needed for moderate pain.    Artificial Tear Solution (GENTEAL TEARS) 0.1-0.2-0.3 % SOLN Place 1 drop into both eyes 3 (three) times daily as needed (dry/irritated eyes.).   aspirin 81 MG chewable tablet Chew 1 tablet (81 mg total) by mouth daily.   B COMPLEX-C PO Take 10 mLs by mouth daily.   baclofen (LIORESAL) 10 MG tablet TAKE 1 TO 2 TABLETS BY MOUTH TWICE DAILY AS NEEDED FOR MUSCLE SPASM   Blood Glucose Monitoring Suppl (ACCU-CHEK GUIDE) w/Device KIT Use to check blood sugar twice daily   cephALEXin (KEFLEX) 500 MG capsule Take 4 tablets (2000 mg) by mouth 60 minutes prior to any dental procedures or cleanings   Cholecalciferol (VITAMIN D3) 125 MCG (5000 UT) TABS  Take 5,000 Units by mouth daily.   clopidogrel (PLAVIX) 75 MG tablet Take 1 tablet (75 mg total) by mouth daily with breakfast.   Ferrous Sulfate (IRON) 325 (65 Fe) MG TABS TAKE 1 TABLET BY MOUTH TWICE DAILY WITH A MEAL   fluconazole (DIFLUCAN) 150 MG tablet Take one tablet by mouth on Day 1. Repeat dose 2nd tablet on Day 3.   furosemide (LASIX) 20 MG tablet Take 1 tablet (20 mg total) by mouth daily as needed for edema.   gabapentin (NEURONTIN) 100 MG capsule Take 100 mg by mouth as needed.   hydrochlorothiazide (HYDRODIURIL) 25 MG tablet Take 1 tablet (25 mg total) by mouth daily. Please schedule office visit for further refills. Thank you!   lactobacillus acidophilus (BACID) TABS tablet Take 2 tablets by mouth 2 (two) times daily as needed (digestive health.).    levocetirizine (XYZAL) 5 MG tablet Take 1 tablet (5 mg total) by mouth every evening.   losartan (COZAAR) 50 MG tablet Take 1 tablet by mouth once daily   metFORMIN (GLUCOPHAGE) 500 MG tablet TAKE 1 TABLET BY MOUTH ONCE DAILY WITH  SUPPER   mupirocin ointment (BACTROBAN) 2 % Apply 1 application topically 2 (two) times daily.   simvastatin (ZOCOR) 20 MG tablet TAKE 1 TABLET BY MOUTH ONCE DAILY AT 6 PM   [DISCONTINUED] Cromolyn Sodium (NASAL ALLERGY NA) Place 1 spray into the nose daily as needed (allergies).   No facility-administered encounter medications on file as of 12/16/2019.     Objective:  BP Readings from Last 3 Encounters:  11/18/19 Marland Kitchen)  130/56  11/17/19 (!) 133/55  11/13/19 (!) 154/71    Goals Addressed              This Visit's Progress     RNCM: Pt-"I usually take my blood pressure daily" (pt-stated)        CARE PLAN ENTRY (see longtitudinal plan of care for additional care plan information)  Current Barriers:   Chronic Disease Management support, education, and care coordination needs related to HTN, HLD, DMII, and CKD Stage 2  Clinical Goal(s) related to HTN, HLD, DMII, and CKD Stage 2 :    Over the next 120 days, patient will:   Work with the care management team to address educational, disease management, and care coordination needs   Begin or continue self health monitoring activities as directed today Measure and record cbg (blood glucose) 3 times a week (last hemoglobin A1C 6.0), Measure and record blood pressure 3 times per week, and adhere to a heart healthy/ADA diet  Call provider office for new or worsened signs and symptoms Blood glucose findings outside established parameters, Blood pressure findings outside established parameters, Oxygen saturation lower than established parameter, Shortness of breath, and New or worsened symptom related to HLD, CKD2,  and other chronic conditions  Call care management team with questions or concerns  Verbalize basic understanding of patient centered plan of care established today  Interventions related to HTN, HLD, DMII, and CKD Stage 2 :   Evaluation of current treatment plans and patient's adherence to plan as established by provider.  12/16/2019: The patient is post TAVR and is doing well. The patient states she is feeling so much better, has more energy, and has made positive changes in her dietary habits. She was going to work with PT but due to the increase in [place] cases she has decided against that right now. She is however doing her own exercises at home and feels she is doing well.   Assessed patient understanding of disease states.  The patient has a good understanding of her conditions. 12-16-2019: The patient states that her proceudre went well. Her blood pressures and blood sugars are stable. Her post TAVR EF% is 65%.  Her weight today was 224.  She says she is noticing she has more energy. She says her daughter is not ready to give her the car keys yet but she feels she is progressing well. Has a follow up with specialist on 12-20-2019.   Assessed patient's education and care coordination needs.  12-16-2019: Education on  safety with rise in [place] cases. The patient has been staying at home and limiting contact with others. Education about not getting outside when it is too hot. The patient has made positive changes in lifestyle modifications to help her health and well being.  Praised for accomplishments.    Provided disease specific education to patient.  12-16-2019: Review of heart healthy/ADA diet.  The patient endorses following a low sodium, low carb diet. She is enjoying fresh vegetables from the garden.   Collaborated with appropriate clinical care team members regarding patient needs.  Patient denies any needs from the LCSW or pharmacist. Will continue to monitor. She knows CCM team is available to assist.   Evaluation of upcoming appointments. Sees the specialist next week on 12-20-2019. Will see pcp as needed. Last visit with pcp on 7-14-202.  Patient Self Care Activities related to HTN, HLD, DMII, and CKD Stage 2 :   Patient is unable to independently self-manage chronic health conditions  Please see past updates related to this goal by clicking on the "Past Updates" button in the selected goal          Plan:   Telephone follow up appointment with care management team member scheduled for: 02-17-2020 at 245pm   [place], MSN, [place] Mobile: [phone]

## 2019-12-20 ENCOUNTER — Ambulatory Visit (HOSPITAL_COMMUNITY): Payer: Medicare Other | Attending: Internal Medicine

## 2019-12-20 ENCOUNTER — Telehealth: Payer: Self-pay

## 2019-12-20 ENCOUNTER — Ambulatory Visit: Payer: Medicare Other | Admitting: Physician Assistant

## 2019-12-20 ENCOUNTER — Other Ambulatory Visit: Payer: Self-pay

## 2019-12-20 DIAGNOSIS — Z952 Presence of prosthetic heart valve: Secondary | ICD-10-CM | POA: Insufficient documentation

## 2019-12-20 LAB — ECHOCARDIOGRAM COMPLETE
AR max vel: 1.38 cm2
AV Area VTI: 1.41 cm2
AV Area mean vel: 1.16 cm2
AV Mean grad: 13 mmHg
AV Peak grad: 23.4 mmHg
Ao pk vel: 2.42 m/s
Area-P 1/2: 2.76 cm2
S' Lateral: 2.8 cm

## 2019-12-20 NOTE — Telephone Encounter (Signed)
Due to provider illness, the patient will keep echo today and have virtual visit tomorrow at 1400 with Nell Range, PA. Consent obtained.    Patient Consent for Virtual Visit         Diana Harmon has provided verbal consent on 12/20/2019 for a virtual visit (video or telephone).   CONSENT FOR VIRTUAL VISIT FOR:  Diana Harmon  By participating in this virtual visit I agree to the following:  I hereby voluntarily request, consent and authorize [place] and its employed or contracted physicians, physician assistants, nurse practitioners or other licensed health care professionals (the Practitioner), to provide me with telemedicine health care services (the "Services") as deemed necessary by the treating Practitioner. I acknowledge and consent to receive the Services by the Practitioner via telemedicine. I understand that the telemedicine visit will involve communicating with the Practitioner through live audiovisual communication technology and the disclosure of certain medical information by electronic transmission. I acknowledge that I have been given the opportunity to request an in-person assessment or other available alternative prior to the telemedicine visit and am voluntarily participating in the telemedicine visit.  I understand that I have the right to withhold or withdraw my consent to the use of telemedicine in the course of my care at any time, without affecting my right to future care or treatment, and that the Practitioner or I may terminate the telemedicine visit at any time. I understand that I have the right to inspect all information obtained and/or recorded in the course of the telemedicine visit and may receive copies of available information for a reasonable fee.  I understand that some of the potential risks of receiving the Services via telemedicine include:  Marland Kitchen Delay or interruption in medical evaluation due to technological equipment failure or  disruption; . Information transmitted may not be sufficient (e.g. poor resolution of images) to allow for appropriate medical decision making by the Practitioner; and/or  . In rare instances, security protocols could fail, causing a breach of personal health information.  Furthermore, I acknowledge that it is my responsibility to provide information about my medical history, conditions and care that is complete and accurate to the best of my ability. I acknowledge that Practitioner's advice, recommendations, and/or decision may be based on factors not within their control, such as incomplete or inaccurate data provided by me or distortions of diagnostic images or specimens that may result from electronic transmissions. I understand that the practice of medicine is not an exact science and that Practitioner makes no warranties or guarantees regarding treatment outcomes. I acknowledge that a copy of this consent can be made available to me via my patient portal ([facility]), or I can request a printed copy by calling the office of [place] [facility].    I understand that my insurance will be billed for this visit.   I have read or had this consent read to me. . I understand the contents of this consent, which adequately explains the benefits and risks of the Services being provided via telemedicine.  . I have been provided ample opportunity to ask questions regarding this consent and the Services and have had my questions answered to my satisfaction. . I give my informed consent for the services to be provided through the use of telemedicine in my medical care

## 2019-12-21 ENCOUNTER — Other Ambulatory Visit: Payer: Self-pay | Admitting: Physician Assistant

## 2019-12-21 ENCOUNTER — Encounter: Payer: Self-pay | Admitting: Physician Assistant

## 2019-12-21 ENCOUNTER — Telehealth (INDEPENDENT_AMBULATORY_CARE_PROVIDER_SITE_OTHER): Payer: Medicare Other | Admitting: Physician Assistant

## 2019-12-21 DIAGNOSIS — I10 Essential (primary) hypertension: Secondary | ICD-10-CM | POA: Diagnosis not present

## 2019-12-21 DIAGNOSIS — D6489 Other specified anemias: Secondary | ICD-10-CM | POA: Diagnosis not present

## 2019-12-21 DIAGNOSIS — Z952 Presence of prosthetic heart valve: Secondary | ICD-10-CM | POA: Diagnosis not present

## 2019-12-21 NOTE — Progress Notes (Signed)
[facility]   MULTIDISCIPLINARY HEART VALVE TEAM  Virtual Visit via Telephone Note   This visit type was conducted due to national recommendations for restrictions regarding the COVID-19 Pandemic (e.g. social distancing) in an effort to limit this patient's exposure and mitigate transmission in our community.  Due to her co-morbid illnesses, this patient is at least at moderate risk for complications without adequate follow up.  This format is felt to be most appropriate for this patient at this time.  The patient did not have access to video technology/had technical difficulties with video requiring transitioning to audio format only (telephone).  All issues noted in this document were discussed and addressed.  No physical exam could be performed with this format.  Please refer to the patient's chart for her  consent to telehealth for [facility].   Evaluation Performed:  Follow-up visit  Date:  12/21/2019   ID:  [place], DOB 05-May-1946, MRN [id number]  Patient Location: Home Provider Location: Office/Clinic  PCP:  [name], DO  Cardiologist:  Dr. Saunders Revel / Dr. Angelena Form & Dr. Cyndia Bent (TAVR)  Chief Complaint:  1 month s/p TAVR  History of Present Illness:    Diana Harmon is a 74 y.o. female with a history of chronic systolic CHF, RBBB, obesity, HTN, HLD, diabetes mellitus, thyroid disease, anemia on chronic iron infusions, and severe aortic stenosiss/p TAVR (11/09/19) who presents to clinic for follow up.   Pt has a history of aortic stenosis.A recent echocardiogram on 08/19/2019 showed an increase in the mean gradient to 32 mmHg with a peak gradient of 75 mmHg. Aortic valve area had decreased to 0.85 cm consistent with severe aortic stenosis. Left ventricular ejection fraction is 60 to 65%. She underwent cardiac catheterization on 10/19/2019 showing no evidence of coronary disease. The mean gradient by catheterization was 29 mmHg with  an aortic valve area of 0.8 cm.  She was evaluated by the multidisciplinary valve team and underwent successful TAVR with a71mm Edwards Sapien 3 UltraTHV via the TF approach on 11/09/19. Post operative echoshowed EF 65%, normally functioning TAVR with a mean gradient of 18 mm hg and no PVL. She was discharged on aspirin and palvix.   She was then readmitted from 7/7-7/10/21 for fever with SIRS. Primary source unknown.Work up was negative and she was treated for a UTI.   Today she presents for follow up. The patient does not have symptoms concerning for COVID-19 infection (fever, chills, cough, or new shortness of breath). Daughter on the phone with her. No CP but does have periodic SOB. She is mostly limited by her arthritis and pain. Pain also make her short of breath. She has occasional LE edema, but no orthopnea or PND. No dizziness or syncope. Had one episode of blood in stool but not urine. No recurrence. No palpitations. Overall, she feels like she can breath "more fully". Still has some fatigue.     Past Medical History:  Diagnosis Date  . Anemia   . Arthritis    legs, knees, back, wrists  . Diabetes mellitus without complication ([place])   . Hypertension   . Nausea   . Patient is [name]   . S/P TAVR (transcatheter aortic valve replacement) 11/09/2019   s/p TAVR with a 26 mm Edwards S3U via the TF approach by Dr. Angelena Form & Cyndia Bent  . Severe aortic stenosis   . SIRS (systemic inflammatory response syndrome) ([place]) 11/2019  . Thyroid disease    Past Surgical History:  Procedure Laterality Date  . CARDIAC CATHETERIZATION  10/19/2019  . COLON SURGERY     caudarized polyps; rectal carcinoid excision ~ 2011  . COLONOSCOPY    . COLONOSCOPY WITH PROPOFOL N/A 03/13/2015   Procedure: COLONOSCOPY WITH PROPOFOL;  Surgeon: Manya Silvas, MD;  Location: [facility] ENDOSCOPY;  Service: Endoscopy;  Laterality: N/A;  . ESOPHAGOGASTRODUODENOSCOPY N/A 03/13/2015   Procedure:  ESOPHAGOGASTRODUODENOSCOPY (EGD);  Surgeon: Manya Silvas, MD;  Location: [facility] ENDOSCOPY;  Service: Endoscopy;  Laterality: N/A;  . INTRAOPERATIVE TRANSTHORACIC ECHOCARDIOGRAM N/A 11/09/2019   Procedure: INTRAOPERATIVE TRANSTHORACIC ECHOCARDIOGRAM;  Surgeon: Burnell Blanks, MD;  Location: [facility];  Service: Open Heart Surgery;  Laterality: N/A;  . PARATHYROIDECTOMY  11/20/2004   right superior parathyroidecomy for primary hyperparathyroidism/parathyroid adenoma  . RIGHT/LEFT HEART CATH AND CORONARY ANGIOGRAPHY N/A 10/19/2019   Procedure: RIGHT/LEFT HEART CATH AND CORONARY ANGIOGRAPHY;  Surgeon: Nelva Bush, MD;  Location: [facility] CV LAB;  Service: Cardiovascular;  Laterality: N/A;  . THYROIDECTOMY    . TRANSCATHETER AORTIC VALVE REPLACEMENT, TRANSFEMORAL N/A 11/09/2019   Procedure: TRANSCATHETER AORTIC VALVE REPLACEMENT, TRANSFEMORAL;  Surgeon: Burnell Blanks, MD;  Location: [facility];  Service: Open Heart Surgery;  Laterality: N/A;     No outpatient medications have been marked as taking for the 12/21/19 encounter (Telemedicine) with Eileen Stanford, PA-C.     Allergies:   Ferumoxytol, Other, and Penicillins   Social History   Tobacco Use  . Smoking status: Former Smoker    Years: 20.00    Types: Cigarettes    Quit date: 09/05/1979    Years since quitting: 40.3  . Smokeless tobacco: Former Network engineer  . Vaping Use: Never used  Substance Use Topics  . Alcohol use: Yes    Alcohol/week: 0.0 - 1.0 standard drinks    Comment: occ. wine  . Drug use: No     Family Hx: The patient's family history includes Aneurysm in her son; Breast cancer in her maternal aunt; Heart disease in her father; Lupus in her sister and son; Parkinson's disease in her mother.  ROS:   Please see the history of present illness.    All other systems reviewed and are negative.   Prior CV studies:   The following studies were reviewed today: TAVR OPERATIVE NOTE   Date of  Procedure:11/09/2019  Preoperative Diagnosis:Severe Aortic Stenosis   Postoperative Diagnosis:Same   Procedure:   Transcatheter Aortic Valve Replacement - PercutaneousRightTransfemoral Approach Edwards Sapien 3 Ultra THV (size 58mm, model # 9750TFX, serial # [id number])  Co-Surgeons:Bryan Alveria Apley, MD / Lauree Chandler, MD   Anesthesiologist:R. Roanna Banning, MD  Anne Hahn, MD  Pre-operative Echo Findings: ? Severe aortic stenosis ? Normalleft ventricular systolic function  Post-operative Echo Findings: ? NOparavalvular leak ? Normalleft ventricular systolic function  _____________  Echo 11/10/19:  IMPRESSIONS  1. Day 1 post TAVR, no paravalvular leak. Upper normal transaortic  gradients with peak/mean 37/18 mmHg.  2. Left ventricular ejection fraction, by estimation, is 65 to 70%. The  left ventricle has normal function. The left ventricle has no regional  wall motion abnormalities. Left ventricular diastolic parameters are  consistent with Grade I diastolic dysfunction (impaired relaxation).  3. Right ventricular systolic function is normal. The right ventricular  size is normal. There is normal pulmonary artery systolic pressure. The  estimated right ventricular systolic pressure is 99.3 mmHg.  4. The mitral valve is normal in structure. No evidence of mitral valve  regurgitation. No evidence of mitral stenosis.  5.  The aortic valve has been repaired/replaced. Aortic valve  regurgitation is not visualized. No aortic stenosis is present. Procedure  Date: 11/09/2019. Echo findings are consistent with normal structure and  function of the aortic valve prosthesis. Aortic  valve mean gradient measures 18.0 mmHg.  6. The inferior vena cava is normal in size with greater than 50%  respiratory variability, suggesting right atrial  pressure of 3 mmHg.  ____________________  Echo 12/20/19 IMPRESSIONS  1. Left ventricular ejection fraction, by estimation, is 65 to 70%. The  left ventricle has normal function. The left ventricle has no regional  wall motion abnormalities. There is moderate left ventricular hypertrophy.  Left ventricular diastolic  parameters are consistent with Grade I diastolic dysfunction (impaired  relaxation).  2. Right ventricular systolic function is normal. The right ventricular  size is normal. There is normal pulmonary artery systolic pressure.  3. The mitral valve is abnormal. Trivial mitral valve regurgitation.  4. The aortic valve has been repaired/replaced. Aortic valve  regurgitation is not visualized. There is a 26 mm Edwards Edwards Sapien  prosthetic (TAVR) valve present in the aortic position. Procedure Date:  11/09/19. Echo findings are consistent with  normal structure and function of the aortic valve prosthesis. Aortic valve  area, by VTI measures 1.41 cm. Aortic valve mean gradient measures 13.0  mmHg. Aortic valve Vmax measures 2.42 m/s.  5. The inferior vena cava is normal in size with greater than 50%  respiratory variability, suggesting right atrial pressure of 3 mmHg.   Comparison(s): Changes from prior study are noted. 11/10/19: 65-70%, AOV  mean gradient 18 mmHg.   Labs/Other Tests and Data Reviewed:    EKG:  No ECG reviewed.  Recent Labs: 11/05/2019: B Natriuretic Peptide 22.9 11/12/2019: ALT 29; Magnesium 2.0 11/13/2019: BUN 19; Creatinine, Ser 0.90; Hemoglobin 8.6; Platelets 163; Potassium 4.5; Sodium 139   Recent Lipid Panel Lab Results  Component Value Date/Time   CHOL 163 07/21/2017 12:00 AM   TRIG 95 07/21/2017 12:00 AM   HDL 68 07/21/2017 12:00 AM   CHOLHDL 2.4 07/21/2017 12:00 AM   LDLCALC 77 07/21/2017 12:00 AM    Wt Readings from Last 3 Encounters:  11/18/19 227 lb 3.2 oz (103.1 kg)  11/17/19 226 lb 12.8 oz (102.9 kg)  11/11/19 232 lb 2.3 oz  (105.3 kg)     Objective:    Vital Signs:  There were no vitals taken for this visit.   ASSESSMENT & PLAN:    Severe AS s/p TAVR: Echo 8/16 shows EF 65%, normally functioning TAVR with a mean gradient of 13 mmHg (improved from 18 mm hg) and no PVL. She has NYHA class II symptoms. SBE prophylaxis discussed; she has keflex due to a non anaphylactic PCN allergy. Continue on aspirin and plavix. She can discontinue plavix after 6 months of therapy (03/2020). I will see her back in 1 year with echo and follow up.   HTN: no changes made today.   Chronic anemia:[name]. Continue iron at homeand iron infusions with Dr. Burlene Arnt. Did have one episode blood in stool with no recurrence. I have asked her to follow this closely.   COVID-19 Education: The signs and symptoms of COVID-19 were discussed with the patient and how to seek care for testing (follow up with PCP or arrange E-visit).  The importance of social distancing was discussed today.  Time:   Today, I have spent 20 minutes with the patient with telehealth technology discussing the above problems.     Medication  Adjustments/Labs and Tests Ordered: Current medicines are reviewed at length with the patient today.  Concerns regarding medicines are outlined above.   Tests Ordered: No orders of the defined types were placed in this encounter.   Medication Changes: No orders of the defined types were placed in this encounter.    Disposition:  Follow up in 1 year(s)  Signed, Angelena Form, PA-C  12/21/2019 2:27 PM    [place]

## 2019-12-21 NOTE — Patient Instructions (Addendum)
Please continue on all your same medications. You will stop plavix after 6 months of therapy when your second bottle of plavix runs out (sometime in 03/2020). Please continue to take antibiotics prior to dental work (including cleanings) to protect you heart valve. I will see you back in 1 year with an echo. You will see Dr. Saunders Revel back in the next 4-6 months in [place]. Please let us know if you have any more blood in your stool.   Thank you and stay well.   Nell Range.

## 2019-12-28 ENCOUNTER — Other Ambulatory Visit: Payer: Self-pay | Admitting: Family Medicine

## 2019-12-28 DIAGNOSIS — E7800 Pure hypercholesterolemia, unspecified: Secondary | ICD-10-CM

## 2019-12-28 DIAGNOSIS — J302 Other seasonal allergic rhinitis: Secondary | ICD-10-CM

## 2020-01-13 ENCOUNTER — Encounter: Payer: Self-pay | Admitting: Internal Medicine

## 2020-01-13 NOTE — Progress Notes (Signed)
Patient states she takes her blood pressure daily and has been a little concerned about the numbers. She states her blood pressure was 137/121 on yesterday 01/12/2020.

## 2020-01-14 ENCOUNTER — Inpatient Hospital Stay: Payer: Medicare Other | Attending: Internal Medicine

## 2020-01-14 ENCOUNTER — Encounter: Payer: Self-pay | Admitting: Internal Medicine

## 2020-01-14 ENCOUNTER — Inpatient Hospital Stay: Payer: Medicare Other | Admitting: Internal Medicine

## 2020-01-14 ENCOUNTER — Other Ambulatory Visit: Payer: Self-pay

## 2020-01-14 DIAGNOSIS — Z85030 Personal history of malignant carcinoid tumor of large intestine: Secondary | ICD-10-CM | POA: Insufficient documentation

## 2020-01-14 DIAGNOSIS — M19032 Primary osteoarthritis, left wrist: Secondary | ICD-10-CM | POA: Insufficient documentation

## 2020-01-14 DIAGNOSIS — Z7984 Long term (current) use of oral hypoglycemic drugs: Secondary | ICD-10-CM | POA: Insufficient documentation

## 2020-01-14 DIAGNOSIS — D649 Anemia, unspecified: Secondary | ICD-10-CM

## 2020-01-14 DIAGNOSIS — E119 Type 2 diabetes mellitus without complications: Secondary | ICD-10-CM | POA: Insufficient documentation

## 2020-01-14 DIAGNOSIS — M19031 Primary osteoarthritis, right wrist: Secondary | ICD-10-CM | POA: Insufficient documentation

## 2020-01-14 DIAGNOSIS — Z79899 Other long term (current) drug therapy: Secondary | ICD-10-CM | POA: Insufficient documentation

## 2020-01-14 DIAGNOSIS — D709 Neutropenia, unspecified: Secondary | ICD-10-CM | POA: Insufficient documentation

## 2020-01-14 DIAGNOSIS — M170 Bilateral primary osteoarthritis of knee: Secondary | ICD-10-CM | POA: Diagnosis not present

## 2020-01-14 DIAGNOSIS — I10 Essential (primary) hypertension: Secondary | ICD-10-CM | POA: Insufficient documentation

## 2020-01-14 DIAGNOSIS — I350 Nonrheumatic aortic (valve) stenosis: Secondary | ICD-10-CM | POA: Insufficient documentation

## 2020-01-14 DIAGNOSIS — Z7982 Long term (current) use of aspirin: Secondary | ICD-10-CM | POA: Insufficient documentation

## 2020-01-14 DIAGNOSIS — D509 Iron deficiency anemia, unspecified: Secondary | ICD-10-CM | POA: Diagnosis not present

## 2020-01-14 DIAGNOSIS — E538 Deficiency of other specified B group vitamins: Secondary | ICD-10-CM | POA: Insufficient documentation

## 2020-01-14 DIAGNOSIS — Z87891 Personal history of nicotine dependence: Secondary | ICD-10-CM | POA: Diagnosis not present

## 2020-01-14 LAB — CBC WITH DIFFERENTIAL/PLATELET
Abs Immature Granulocytes: 0.01 10*3/uL (ref 0.00–0.07)
Basophils Absolute: 0 10*3/uL (ref 0.0–0.1)
Basophils Relative: 1 %
Eosinophils Absolute: 0.1 10*3/uL (ref 0.0–0.5)
Eosinophils Relative: 3 %
HCT: 32.7 % — ABNORMAL LOW (ref 36.0–46.0)
Hemoglobin: 10.4 g/dL — ABNORMAL LOW (ref 12.0–15.0)
Immature Granulocytes: 0 %
Lymphocytes Relative: 31 %
Lymphs Abs: 1 10*3/uL (ref 0.7–4.0)
MCH: 29.6 pg (ref 26.0–34.0)
MCHC: 31.8 g/dL (ref 30.0–36.0)
MCV: 93.2 fL (ref 80.0–100.0)
Monocytes Absolute: 0.4 10*3/uL (ref 0.1–1.0)
Monocytes Relative: 14 %
Neutro Abs: 1.6 10*3/uL — ABNORMAL LOW (ref 1.7–7.7)
Neutrophils Relative %: 51 %
Platelets: 206 10*3/uL (ref 150–400)
RBC: 3.51 MIL/uL — ABNORMAL LOW (ref 3.87–5.11)
RDW: 13.8 % (ref 11.5–15.5)
WBC: 3.2 10*3/uL — ABNORMAL LOW (ref 4.0–10.5)
nRBC: 0 % (ref 0.0–0.2)

## 2020-01-14 LAB — BASIC METABOLIC PANEL
Anion gap: 12 (ref 5–15)
BUN: 36 mg/dL — ABNORMAL HIGH (ref 8–23)
CO2: 22 mmol/L (ref 22–32)
Calcium: 9.2 mg/dL (ref 8.9–10.3)
Chloride: 104 mmol/L (ref 98–111)
Creatinine, Ser: 1.19 mg/dL — ABNORMAL HIGH (ref 0.44–1.00)
GFR calc Af Amer: 52 mL/min — ABNORMAL LOW (ref >60)
GFR calc non Af Amer: 45 mL/min — ABNORMAL LOW (ref >60)
Glucose, Bld: 214 mg/dL — ABNORMAL HIGH (ref 70–99)
Potassium: 4.3 mmol/L (ref 3.5–5.1)
Sodium: 138 mmol/L (ref 135–145)

## 2020-01-14 NOTE — Assessment & Plan Note (Addendum)
#  Normocytic anemia unclear etiology; previous history of AVM iron deficiency; September 2020-iron studies negative for deficiency.  Bone marrow biopsy negative for any significant dyspoiesis.  Cytogenetics normal.  #Today hemoglobin is 10.4 overall stable.  Slightly low; otherwise fairly asymptomatic.Patient is on p.o. iron.  ng LDH.                            #White count 3.2/chronic benign ethnic neutropenia-neutrophil count 1.6.  Stable.  Asymptomatic.  # Relative B12 deficiency-200; March 2021 -normal continue sublingual B12 tablets.   I spoke at length with the patient/daughter- regarding the patient's clinical status/plan of care.  Family agreement.   # DISPOSITION: # in 6 months-MD; labs- cbc/bmp/ iron studies/ferritin---Dr.B

## 2020-01-14 NOTE — Progress Notes (Signed)
[place] NOTE  Patient Care Team: [name], DO as PCP - General (Family Medicine) Cammie Sickle, MD as Consulting Physician (Internal Medicine) Lorelee Cover., MD (Ophthalmology) End, Harrell Gave, MD as Consulting Physician (Cardiology) Vanita Ingles, RN as Case Manager ([place]) Cammie Sickle, MD as Consulting Physician (Internal Medicine)  CHIEF COMPLAINTS/PURPOSE OF CONSULTATION:   # AUG 2016- IRON DEFICIENCY ANEMIA-  Possible AVMs of colon [s/p Argon laser; Dr.Elliot;EGD-neg Nov 2016] s/p IV Ferriheme x2 [[id number]; ALLERGY]; Venofer IV [pre-meds]; worsening anemia not responding to iron- November 2020-bone marrow biopsy-erythroid hyperplasia but no dysplasia no blasts. NOV 2020- CT A/P-negative; CXR-NEG.  #Relative B12 deficiency-sublingual B12 [Jan 2021]  #December 2020 -elevated anti-CCP- [s/p evaluation Dr. Elder Negus  # MILD LEUCOPENIA/NEUTROPENIA [facility] 1.2-1.3]-chronic ethnic neutropenia/asymptomatic  # Rectal well diff carcinoid [1.2CM; s/p polypectomy 2011]; Allergy- [facility] Bouchard [Nov 2016]- neck/face swelling [improved with benadryl]; June 2021- TAVR for severe AS  HISTORY OF PRESENTING ILLNESS:  Diana Harmon 74 y.o.  female is here for follow-up for anemia of unclear etiology is here for follow-up.  In the interim patient underwent TAVR procedure for her severe AS.  Status post procedure patient was admitted to hospital for fever of unclear source July 2021.  Overall since the procedure, patient states her breathing is improved.  Denies any blood in stools or black-colored stools.   Review of Systems  Constitutional: Negative for chills, diaphoresis, fever and weight loss.  HENT: Negative for nosebleeds and sore throat.   Eyes: Negative for double vision.  Respiratory: Negative for cough, hemoptysis, sputum production, shortness of breath and wheezing.   Cardiovascular: Negative for  chest pain, palpitations, orthopnea and leg swelling.  Gastrointestinal: Negative for abdominal pain, blood in stool, constipation, diarrhea, heartburn, melena, nausea and vomiting.  Genitourinary: Negative for dysuria, frequency and urgency.  Musculoskeletal: Positive for joint pain. Negative for back pain.  Skin: Negative.  Negative for itching and rash.  Neurological: Negative for dizziness, tingling, focal weakness, weakness and headaches.  Endo/Heme/Allergies: Does not bruise/bleed easily.  Psychiatric/Behavioral: Negative for depression. The patient is not nervous/anxious and does not have insomnia.      MEDICAL HISTORY:  Past Medical History:  Diagnosis Date  . Anemia   . Arthritis    legs, knees, back, wrists  . Diabetes mellitus without complication ([place])   . Hypertension   . Nausea   . Patient is [name]   . S/P TAVR (transcatheter aortic valve replacement) 11/09/2019   s/p TAVR with a 26 mm Edwards S3U via the TF approach by Dr. Angelena Form & Cyndia Bent  . Severe aortic stenosis   . SIRS (systemic inflammatory response syndrome) ([place]) 11/2019  . Thyroid disease     SURGICAL HISTORY: Past Surgical History:  Procedure Laterality Date  . CARDIAC CATHETERIZATION  10/19/2019  . COLON SURGERY     caudarized polyps; rectal carcinoid excision ~ 2011  . COLONOSCOPY    . COLONOSCOPY WITH PROPOFOL N/A 03/13/2015   Procedure: COLONOSCOPY WITH PROPOFOL;  Surgeon: Manya Silvas, MD;  Location: [facility] ENDOSCOPY;  Service: Endoscopy;  Laterality: N/A;  . ESOPHAGOGASTRODUODENOSCOPY N/A 03/13/2015   Procedure: ESOPHAGOGASTRODUODENOSCOPY (EGD);  Surgeon: Manya Silvas, MD;  Location: [facility] ENDOSCOPY;  Service: Endoscopy;  Laterality: N/A;  . INTRAOPERATIVE TRANSTHORACIC ECHOCARDIOGRAM N/A 11/09/2019   Procedure: INTRAOPERATIVE TRANSTHORACIC ECHOCARDIOGRAM;  Surgeon: Burnell Blanks, MD;  Location: [facility];  Service: Open Heart Surgery;  Laterality: N/A;  . PARATHYROIDECTOMY   11/20/2004   right superior  parathyroidecomy for primary hyperparathyroidism/parathyroid adenoma  . RIGHT/LEFT HEART CATH AND CORONARY ANGIOGRAPHY N/A 10/19/2019   Procedure: RIGHT/LEFT HEART CATH AND CORONARY ANGIOGRAPHY;  Surgeon: Nelva Bush, MD;  Location: [facility];  Service: Cardiovascular;  Laterality: N/A;  . THYROIDECTOMY    . TRANSCATHETER AORTIC VALVE REPLACEMENT, TRANSFEMORAL N/A 11/09/2019   Procedure: TRANSCATHETER AORTIC VALVE REPLACEMENT, TRANSFEMORAL;  Surgeon: Burnell Blanks, MD;  Location: [facility];  Service: Open Heart Surgery;  Laterality: N/A;    SOCIAL HISTORY: Social History   Socioeconomic History  . Marital status: Divorced    Spouse name: Not on file  . Number of children: 2  . Years of education: 20  . Highest education level: 12th grade  Occupational History  . Occupation: retired-worked with nandicapped adults  Tobacco Use  . Smoking status: Former Smoker    Years: 20.00    Types: Cigarettes    Quit date: 09/05/1979    Years since quitting: 40.3  . Smokeless tobacco: Former Network engineer  . Vaping Use: Never used  Substance and Sexual Activity  . Alcohol use: Yes    Alcohol/week: 0.0 - 1.0 standard drinks    Comment: occ. wine  . Drug use: No  . Sexual activity: Not on file  Other Topics Concern  . Not on file  Social History Narrative  . Not on file   Social Determinants of Health   Financial Resource Strain: Low Risk   . Difficulty of Paying Living Expenses: Not hard at all  Food Insecurity: No Food Insecurity  . Worried About Charity fundraiser in the Last Year: Never true  . Ran Out of Food in the Last Year: Never true  Transportation Needs: No Transportation Needs  . Lack of Transportation (Medical): No  . Lack of Transportation (Non-Medical): No  Physical Activity: Inactive  . Days of Exercise per Week: 0 days  . Minutes of Exercise per Session: 0 min  Stress: No Stress Concern Present  . Feeling of Stress  : Not at all  Social Connections: Moderately Integrated  . Frequency of Communication with Friends and Family: More than three times a week  . Frequency of Social Gatherings with Friends and Family: Never  . Attends Religious Services: More than 4 times per year  . Active Member of Clubs or Organizations: Yes  . Attends Archivist Meetings: More than 4 times per year  . Marital Status: Divorced  Human resources officer Violence: Not At Risk  . Fear of Current or Ex-Partner: No  . Emotionally Abused: No  . Physically Abused: No  . Sexually Abused: No    FAMILY HISTORY: Family History  Problem Relation Age of Onset  . Parkinson's disease Mother   . Heart disease Father   . Lupus Sister   . Aneurysm Son   . Lupus Son   . Breast cancer Maternal Aunt        mat great aunt    ALLERGIES:  is allergic to ferumoxytol, other, and penicillins.  MEDICATIONS:  Current Outpatient Medications  Medication Sig Dispense Refill  . ACCU-CHEK GUIDE test strip Use to check blood sugar twice daily 200 each 12  . Accu-Chek Softclix Lancets lancets Use to check blood sugar twice daily 200 each 12  . acetaminophen (TYLENOL) 500 MG tablet Take 1,000 mg by mouth every 6 (six) hours as needed for moderate pain.     . Artificial Tear Solution (GENTEAL TEARS) 0.1-0.2-0.3 % SOLN Place 1 drop into both  eyes 3 (three) times daily as needed (dry/irritated eyes.).    Marland Kitchen aspirin 81 MG chewable tablet Chew 1 tablet (81 mg total) by mouth daily.    . B COMPLEX-C PO Take 10 mLs by mouth daily.    . baclofen (LIORESAL) 10 MG tablet TAKE 1 TO 2 TABLETS BY MOUTH TWICE DAILY AS NEEDED FOR MUSCLE SPASM 60 tablet 2  . Blood Glucose Monitoring Suppl (ACCU-CHEK GUIDE) w/Device KIT Use to check blood sugar twice daily 1 kit 0  . cephALEXin (KEFLEX) 500 MG capsule Take 4 tablets (2000 mg) by mouth 60 minutes prior to any dental procedures or cleanings 8 capsule 1  . Cholecalciferol (VITAMIN D3) 125 MCG (5000 UT) TABS Take  5,000 Units by mouth daily.    . clopidogrel (PLAVIX) 75 MG tablet Take 1 tablet (75 mg total) by mouth daily with breakfast. 90 tablet 1  . Ferrous Sulfate (IRON) 325 (65 Fe) MG TABS TAKE 1 TABLET BY MOUTH TWICE DAILY WITH A MEAL 180 each 1  . fluconazole (DIFLUCAN) 150 MG tablet Take one tablet by mouth on Day 1. Repeat dose 2nd tablet on Day 3. 2 tablet 0  . gabapentin (NEURONTIN) 100 MG capsule Take 100 mg by mouth as needed.    . hydrochlorothiazide (HYDRODIURIL) 25 MG tablet Take 1 tablet (25 mg total) by mouth daily. Please schedule office visit for further refills. Thank you! 90 tablet 0  . lactobacillus acidophilus (BACID) TABS tablet Take 2 tablets by mouth 2 (two) times daily as needed (digestive health.).     Marland Kitchen levocetirizine (XYZAL) 5 MG tablet TAKE 1 TABLET BY MOUTH ONCE DAILY IN THE EVENING 90 tablet 3  . losartan (COZAAR) 50 MG tablet Take 1 tablet by mouth once daily 90 tablet 0  . metFORMIN (GLUCOPHAGE) 500 MG tablet TAKE 1 TABLET BY MOUTH ONCE DAILY WITH  SUPPER 90 tablet 0  . mupirocin ointment (BACTROBAN) 2 % Apply 1 application topically 2 (two) times daily. 22 g 1  . simvastatin (ZOCOR) 20 MG tablet TAKE 1 TABLET BY MOUTH ONCE DAILY AT  6  PM 90 tablet 3  . furosemide (LASIX) 20 MG tablet Take 1 tablet (20 mg total) by mouth daily as needed for edema. 45 tablet 6   No current facility-administered medications for this visit.      Marland Kitchen  PHYSICAL EXAMINATION: ECOG PERFORMANCE STATUS: 1 - Symptomatic but completely ambulatory  Vitals:   01/14/20 1016  BP: 128/65  Pulse: 89  Resp: 18  Temp: (!) 97.5 F (36.4 C)  SpO2: 100%   Filed Weights   01/14/20 1016  Weight: 235 lb (106.6 kg)    Physical Exam Constitutional:      Comments: Patient is obese.  In a wheelchair.  Accompanied by daughter.  HENT:     Head: Normocephalic and atraumatic.     Mouth/Throat:     Pharynx: No oropharyngeal exudate.  Eyes:     Pupils: Pupils are equal, round, and reactive to light.   Cardiovascular:     Rate and Rhythm: Normal rate and regular rhythm.  Pulmonary:     Effort: Pulmonary effort is normal. No respiratory distress.     Breath sounds: Normal breath sounds. No wheezing.  Abdominal:     General: Bowel sounds are normal. There is no distension.     Palpations: Abdomen is soft. There is no mass.     Tenderness: There is no abdominal tenderness. There is no guarding or rebound.  Musculoskeletal:  General: No tenderness. Normal range of motion.     Cervical back: Normal range of motion and neck supple.  Skin:    General: Skin is warm.  Neurological:     Mental Status: She is alert and oriented to person, place, and time.  Psychiatric:        Mood and Affect: Affect normal.     LABORATORY DATA:  I have reviewed the data as listed Lab Results  Component Value Date   WBC 3.2 (L) 01/14/2020   HGB 10.4 (L) 01/14/2020   HCT 32.7 (L) 01/14/2020   MCV 93.2 01/14/2020   PLT 206 01/14/2020   Recent Labs    11/05/19 1104 11/09/19 1420 11/10/19 2138 11/11/19 0609 11/12/19 0414 11/13/19 0354 01/14/20 0948  NA 139   < > 135  --  136 139 138  K 4.0   < > 4.0  --  4.3 4.5 4.3  CL 106   < > 103  --  108 113* 104  CO2 23   < > 20*  --  20* 18* 22  GLUCOSE 219*   < > 240*  --  202* 179* 214*  BUN 29*   < > 24*  --  21 19 36*  CREATININE 1.06*   < > 1.20*   < > 1.01* 0.90 1.19*  CALCIUM 9.7   < > 9.3  --  8.4* 8.6* 9.2  GFRNONAA 52*   < > 45*   < > 55* >60 45*  GFRAA >60   < > 52*   < > >60 >60 52*  PROT 7.7  --  7.2  --  6.3*  --   --   ALBUMIN 3.4*  --  3.1*  --  2.5*  --   --   AST 61*  --  45*  --  34  --   --   ALT 39  --  33  --  29  --   --   ALKPHOS 53  --  54  --  43  --   --   BILITOT 0.6  --  0.5  --  0.6  --   --    < > = values in this interval not displayed.    ASSESSMENT & PLAN:  Normocytic anemia #Normocytic anemia unclear etiology; previous history of AVM iron deficiency; September 2020-iron studies negative for deficiency.   Bone marrow biopsy negative for any significant dyspoiesis.  Cytogenetics normal.  #Today hemoglobin is 10.4 overall stable.  Slightly low; otherwise fairly asymptomatic.Patient is on p.o. iron.  ng LDH.                            #White count 3.2/chronic benign ethnic neutropenia-neutrophil count 1.6.  Stable.  Asymptomatic.  # Relative B12 deficiency-200; March 2021 -normal continue sublingual B12 tablets.   I spoke at length with the patient/daughter- regarding the patient's clinical status/plan of care.  Family agreement.   # DISPOSITION: # in 6 months-MD; labs- cbc/bmp/ iron studies/ferritin---Dr.B       Cammie Sickle, MD 01/14/2020 12:40 PM

## 2020-02-11 ENCOUNTER — Other Ambulatory Visit: Payer: Self-pay

## 2020-02-11 ENCOUNTER — Ambulatory Visit (INDEPENDENT_AMBULATORY_CARE_PROVIDER_SITE_OTHER): Payer: Medicare Other

## 2020-02-11 DIAGNOSIS — Z23 Encounter for immunization: Secondary | ICD-10-CM | POA: Diagnosis not present

## 2020-02-14 ENCOUNTER — Other Ambulatory Visit: Payer: Self-pay | Admitting: Family Medicine

## 2020-02-14 DIAGNOSIS — I10 Essential (primary) hypertension: Secondary | ICD-10-CM

## 2020-02-14 NOTE — Telephone Encounter (Signed)
Requested Prescriptions  Pending Prescriptions Disp Refills   losartan (COZAAR) 50 MG tablet [Pharmacy Med Name: Losartan Potassium 50 MG Oral Tablet] 90 tablet 0    Sig: Take 1 tablet by mouth once daily     Cardiovascular:  Angiotensin Receptor Blockers Failed - 02/14/2020 10:22 AM      Failed - Cr in normal range and within 180 days    Creat  Date Value Ref Range Status  07/21/2017 0.91 0.60 - 0.93 mg/dL Final    Comment:    For patients >73 years of age, the reference limit for Creatinine is approximately 13% higher for people identified as African-American. .    Creatinine, Ser  Date Value Ref Range Status  01/14/2020 1.19 (H) 0.44 - 1.00 mg/dL Final         Passed - K in normal range and within 180 days    Potassium  Date Value Ref Range Status  01/14/2020 4.3 3.5 - 5.1 mmol/L Final         Passed - Patient is not pregnant      Passed - Last BP in normal range    BP Readings from Last 1 Encounters:  01/14/20 128/65         Passed - Valid encounter within last 6 months    Recent Outpatient Visits          2 months ago SIRS (systemic inflammatory response syndrome) ([place])   [facility] [place], Devonne Doughty, DO   5 months ago Herpes zoster without complication   [place], DO   10 months ago Type 2 diabetes mellitus with other specified complication, without long-term current use of insulin [facility])   [facility], Devonne Doughty, DO   2 years ago Controlled type 2 diabetes mellitus without complication, without long-term current use of insulin [facility])   [place], DO   2 years ago Chronic pain of both knees   [place], DO      Future Appointments            In 2 months End, Harrell Gave, MD [facility] [place]

## 2020-02-15 ENCOUNTER — Other Ambulatory Visit: Payer: Self-pay | Admitting: Family Medicine

## 2020-02-15 DIAGNOSIS — I10 Essential (primary) hypertension: Secondary | ICD-10-CM

## 2020-02-17 ENCOUNTER — Telehealth: Payer: Self-pay

## 2020-02-17 ENCOUNTER — Telehealth: Payer: Self-pay | Admitting: General Practice

## 2020-02-17 NOTE — Telephone Encounter (Signed)
  Chronic Care Management   Outreach Note  02/17/2020 Name: Diana Harmon MRN: [id number] DOB: 11/18/1945  Referred by: [name], DO Reason for referral : Chronic Care Management (RNCM Follow up call for Chronic disease Management and Care Coordination Needs)   An unsuccessful telephone outreach was attempted today. The patient was referred to the case management team for assistance with care management and care coordination.   Follow Up Plan: The care management team will reach out to the patient again over the next 30 to 60 days.   Noreene Larsson RN, MSN, [place] Mobile: [phone]

## 2020-02-23 ENCOUNTER — Telehealth: Payer: Self-pay

## 2020-02-23 NOTE — Chronic Care Management (AMB) (Signed)
°  Care Management   Note  02/23/2020 Name: Diana Harmon MRN: [id number] DOB: 11/16/1945  Diana Harmon is a 74 y.o. year old female who is a primary care patient of [name], DO and is actively engaged with the care management team. I reached out to Lexine Baton by phone today to assist with re-scheduling a follow up visit with the RN Case Manager  Follow up plan: Unsuccessful telephone outreach attempt made. The care management team will reach out to the patient again over the next 7 days.  If patient returns call to provider office, please advise to call [place] [facility]  at [place], [place], [place], [place] [address] Direct Dial: [phone] [email or web address] Website: [email or web address]

## 2020-02-23 NOTE — Chronic Care Management (AMB) (Signed)
  Care Management   Note  02/23/2020 Name: Diana Harmon MRN: [id number] DOB: 07-06-1945  Diana Harmon is a 74 y.o. year old female who is a primary care patient of [name], DO and is actively engaged with the care management team. I reached out to [place] by phone today to assist with re-scheduling a follow up visit with the RN Case Manager  Follow up plan: Telephone appointment with care management team member scheduled for:04/03/2020  Noreene Larsson, [place], [place], [place] [address] Direct Dial: [phone] [email or web address]: [email or web address]

## 2020-02-23 NOTE — Telephone Encounter (Signed)
Pt has been r/s  

## 2020-03-02 ENCOUNTER — Other Ambulatory Visit: Payer: Self-pay | Admitting: Family Medicine

## 2020-03-02 DIAGNOSIS — E1169 Type 2 diabetes mellitus with other specified complication: Secondary | ICD-10-CM

## 2020-03-08 MED ORDER — HYDROCHLOROTHIAZIDE 25 MG PO TABS: 25.0000 mg | ORAL_TABLET | Freq: Every day | ORAL | 0 refills | Status: DC

## 2020-03-10 ENCOUNTER — Ambulatory Visit (INDEPENDENT_AMBULATORY_CARE_PROVIDER_SITE_OTHER): Payer: Medicare Other | Admitting: Family Medicine

## 2020-03-10 ENCOUNTER — Other Ambulatory Visit: Payer: Self-pay

## 2020-03-10 ENCOUNTER — Encounter: Payer: Self-pay | Admitting: Family Medicine

## 2020-03-10 VITALS — BP 125/47 | HR 81 | Temp 97.3°F | Resp 16 | Ht 60.0 in | Wt 232.0 lb

## 2020-03-10 DIAGNOSIS — E1169 Type 2 diabetes mellitus with other specified complication: Secondary | ICD-10-CM | POA: Diagnosis not present

## 2020-03-10 DIAGNOSIS — I10 Essential (primary) hypertension: Secondary | ICD-10-CM | POA: Diagnosis not present

## 2020-03-10 DIAGNOSIS — E785 Hyperlipidemia, unspecified: Secondary | ICD-10-CM | POA: Diagnosis not present

## 2020-03-10 LAB — POCT GLYCOSYLATED HEMOGLOBIN (HGB A1C): Hemoglobin A1C: 8.1 % — AB (ref 4.0–5.6)

## 2020-03-10 MED ORDER — LOSARTAN POTASSIUM 50 MG PO TABS: 50.0000 mg | ORAL_TABLET | Freq: Every day | ORAL | 3 refills | Status: DC

## 2020-03-10 NOTE — Assessment & Plan Note (Signed)
Controlled  Plan: 1. Continue current meds - Simvastatin 20mg  daily 2. Encourage improved lifestyle - low carb/cholesterol, reduce portion size, continue improving activity as tolerated  Future return for lipid panel

## 2020-03-10 NOTE — Assessment & Plan Note (Signed)
BMI >45 Counsel diet lifestyle exercise Consider GLP1 now

## 2020-03-10 NOTE — Assessment & Plan Note (Signed)
Well-controlled HTN - Home BP readings reviewed  Complication meniere's disease, mild AS, CKD II   Plan:  1.  Continue current BP regimen HCTZ '25mg'$  daily, Losartan '50mg'$  daily refill 2. Encourage improved lifestyle - low sodium diet, regular exercise 3. Continue monitor BP outside office, bring readings to next visit, if persistently >140/90 or new symptoms notify office sooner

## 2020-03-10 NOTE — Patient Instructions (Addendum)
Thank you for coming to the office today.  Keep improving diet.  Call insurance find cost and coverage of the following  ICD10: E11.69 (Diabetes)   Rybelsus (pill - oral semaglutide or ozempic) - once daily, taken first thing in morning without other meds Ozempic (injection once week Trulicity (injection once week  We can get these newer meds at low cost if you are interested.  3 benefits - 1 significantly reduced A1c sugar, and may be able to reduce or stop metformin in future - 2 reduced appetite and weight loss with good results - 3 cardiovascular risk reduction, less likely to have heart attack/stroke  Reduce A1c, benefit of weight loss and reducing Cardiovascular events  For now until start a new med - take ONE metformin a day.  If start one of these, you can STOP the metformin for now.  Please schedule a Follow-up Appointment to: Return in about 4 months (around 07/08/2020) for 3 month DM A1c, med adjust.  If you have any other questions or concerns, please feel free to call the office or send a message through [place]. You may also schedule an earlier appointment if necessary.  Additionally, you may be receiving a survey about your experience at our office within a few days to 1 week by e-mail or mail. We value your feedback.  [name], DO [employer]

## 2020-03-10 NOTE — Assessment & Plan Note (Signed)
Elevated A1c 7.7 up to 8.1, goal < 8 No hyperglycemia or hypoglycemia Complications - other including hyperlipidemia, GERD, obesity - increases risk of future cardiovascular complications   Plan:  Discuss new GLP1 therapy handout given she will check into insurance coverage cost and we can order or give free sample of injection Ozempic or Trulicity if decides, may continue Metformin IR 500 mg daily for now until we start new med May refer to [facility] pharmacy for financial assist if need  2. Encourage improved lifestyle - low carb, low sugar diet, reduce portion size, continue improving regular exercise 3. Check CBG, bring log to next visit for review 4. Continue ASA, ARB, Statin DM Foot today

## 2020-03-10 NOTE — Progress Notes (Signed)
Subjective:    Patient ID: Diana Harmon, female    DOB: 1946/01/07, 74 y.o.   MRN: [id number]  Diana Harmon is a 74 y.o. female presenting on 03/10/2020 for Diabetes   HPI   CHRONIC DM, Type 2with Hyperlipidemia, Morbid Obesity HTN  Last lab A1c 7.7. Now due for A1c today Admits diet changes in past >6 months, some instant pudding dessert, fried foods and other areas that can impact her diet. Side effect on metformin previously down to 1 a day CBGs: Avg 150+ check few times week Reports good compliance. But thinks now side effects Currently on ARB, ASA 81, Simvastatin 20mg  Lifestyle: Improving DM diet. Low carb - Followed by Dr Lorie Apley optometry Denies hypoglycemia  HM UTD COVID vaccine, Flu Shot  Depression screen [facility] 2/9 03/10/2020 09/14/2019 09/13/2019  Decreased Interest 0 0 0  Down, Depressed, Hopeless 0 0 0  PHQ - 2 Score 0 0 0    Social History   Tobacco Use  . Smoking status: Former Smoker    Years: 20.00    Types: Cigarettes    Quit date: 09/05/1979    Years since quitting: 40.5  . Smokeless tobacco: Former Network engineer  . Vaping Use: Never used  Substance Use Topics  . Alcohol use: Yes    Alcohol/week: 0.0 - 1.0 standard drinks    Comment: occ. wine  . Drug use: No    Review of Systems Per HPI unless specifically indicated above     Objective:    BP (!) 125/47   Pulse 81   Temp (!) 97.3 F (36.3 C) (Temporal)   Resp 16   Ht 5' (1.524 m)   Wt 232 lb (105.2 kg)   SpO2 100%   BMI 45.31 kg/m   Wt Readings from Last 3 Encounters:  03/10/20 232 lb (105.2 kg)  01/14/20 235 lb (106.6 kg)  11/18/19 227 lb 3.2 oz (103.1 kg)    Physical Exam Vitals and nursing note reviewed.  Constitutional:      General: She is not in acute distress.    Appearance: She is well-developed. She is obese. She is not diaphoretic.     Comments: Well-appearing, comfortable, cooperative  HENT:     Head: Normocephalic and atraumatic.    Eyes:     General:        Right eye: No discharge.        Left eye: No discharge.     Conjunctiva/sclera: Conjunctivae normal.  Neck:     Thyroid: No thyromegaly.  Cardiovascular:     Rate and Rhythm: Normal rate and regular rhythm.     Heart sounds: Normal heart sounds. No murmur heard.   Pulmonary:     Effort: Pulmonary effort is normal. No respiratory distress.     Breath sounds: Normal breath sounds. No wheezing or rales.  Musculoskeletal:        General: Normal range of motion.     Cervical back: Normal range of motion and neck supple.     Right lower leg: Edema (mild non pitting) present.     Left lower leg: Edema (mild non pitting) present.     Comments: Uses walker  Lymphadenopathy:     Cervical: No cervical adenopathy.  Skin:    General: Skin is warm and dry.     Findings: No erythema or rash.  Neurological:     Mental Status: She is alert and oriented to person, place, and time.  Psychiatric:  Behavior: Behavior normal.     Comments: Well groomed, good eye contact, normal speech and thoughts      Recent Labs    11/05/19 1105 03/10/20 1017  HGBA1C 7.7* 8.1*     Diabetic Foot Exam - Simple   Simple Foot Form Diabetic Foot exam was performed with the following findings: Yes 03/10/2020 10:29 AM  Visual Inspection See comments: Yes Sensation Testing Intact to touch and monofilament testing bilaterally: Yes Pulse Check Posterior Tibialis and Dorsalis pulse intact bilaterally: Yes Comments Mild foot callus formation, no ulceration. Intact sensation to monofilament.    Recent Labs    11/05/19 1105 03/10/20 1017  HGBA1C 7.7* 8.1*     Results for orders placed or performed in visit on 03/10/20  POCT HgB A1C  Result Value Ref Range   Hemoglobin A1C 8.1 (A) 4.0 - 5.6 %      Assessment & Plan:   Problem List Items Addressed This Visit    Type 2 diabetes mellitus with other specified complication ([place]) - Primary    Elevated A1c 7.7 up to 8.1,  goal < 8 No hyperglycemia or hypoglycemia Complications - other including hyperlipidemia, GERD, obesity - increases risk of future cardiovascular complications   Plan:  Discuss new GLP1 therapy handout given she will check into insurance coverage cost and we can order or give free sample of injection Ozempic or Trulicity if decides, may continue Metformin IR 500 mg daily for now until we start new med May refer to [facility] pharmacy for financial assist if need  2. Encourage improved lifestyle - low carb, low sugar diet, reduce portion size, continue improving regular exercise 3. Check CBG, bring log to next visit for review 4. Continue ASA, ARB, Statin DM Foot today      Relevant Medications   losartan (COZAAR) 50 MG tablet   Other Relevant Orders   POCT HgB A1C (Completed)   Morbid obesity (HCC)    BMI >45 Counsel diet lifestyle exercise Consider GLP1 now      Hyperlipidemia associated with type 2 diabetes mellitus ([place])    Controlled  Plan: 1. Continue current meds - Simvastatin 20mg  daily 2. Encourage improved lifestyle - low carb/cholesterol, reduce portion size, continue improving activity as tolerated  Future return for lipid panel      Relevant Medications   losartan (COZAAR) 50 MG tablet   Essential hypertension    Well-controlled HTN - Home BP readings reviewed  Complication meniere's disease, mild AS, CKD II   Plan:  1.  Continue current BP regimen HCTZ 25mg  daily, Losartan 50mg  daily refill 2. Encourage improved lifestyle - low sodium diet, regular exercise 3. Continue monitor BP outside office, bring readings to next visit, if persistently >140/90 or new symptoms notify office sooner      Relevant Medications   losartan (COZAAR) 50 MG tablet        Meds ordered this encounter  Medications  . losartan (COZAAR) 50 MG tablet    Sig: Take 1 tablet (50 mg total) by mouth daily.    Dispense:  90 tablet    Refill:  3      Follow up plan: Return in about  4 months (around 07/08/2020) for 3 month DM A1c, med adjust.   [name], DO [facility] 03/10/2020, 10:15 AM

## 2020-03-15 DIAGNOSIS — E1169 Type 2 diabetes mellitus with other specified complication: Secondary | ICD-10-CM

## 2020-03-28 MED ORDER — TRULICITY 0.75 MG/0.5ML ~~LOC~~ SOAJ: 0.7500 mg | SUBCUTANEOUS | 2 refills | Status: DC

## 2020-03-28 NOTE — Addendum Note (Signed)
Addended by: [name] on: 03/28/2020 08:24 AM   Modules accepted: Orders

## 2020-04-03 ENCOUNTER — Ambulatory Visit: Payer: Self-pay | Admitting: General Practice

## 2020-04-03 ENCOUNTER — Telehealth: Payer: Medicare Other | Admitting: General Practice

## 2020-04-03 DIAGNOSIS — I5032 Chronic diastolic (congestive) heart failure: Secondary | ICD-10-CM

## 2020-04-03 DIAGNOSIS — E785 Hyperlipidemia, unspecified: Secondary | ICD-10-CM

## 2020-04-03 DIAGNOSIS — E1169 Type 2 diabetes mellitus with other specified complication: Secondary | ICD-10-CM

## 2020-04-03 DIAGNOSIS — I10 Essential (primary) hypertension: Secondary | ICD-10-CM

## 2020-04-03 NOTE — Chronic Care Management (AMB) (Signed)
Chronic Care Management   Follow Up Note   04/03/2020 Name: Diana Harmon MRN: [id number] DOB: 1945-10-10  Referred by: [name], DO Reason for referral : Chronic Care Management (RNCM: Chronic Disease Management and Care Coordination Needs)   Diana Harmon is a 74 y.o. year old female who is a primary care patient of [name], DO. The CCM team was consulted for assistance with chronic disease management and care coordination needs.    Review of patient status, including review of consultants reports, relevant laboratory and other test results, and collaboration with appropriate care team members and the patient's provider was performed as part of comprehensive patient evaluation and provision of chronic care management services.    SDOH (Social Determinants of Health) assessments performed: Yes See Care Plan activities for detailed interventions related to [facility])     Outpatient Encounter Medications as of 04/03/2020  Medication Sig   ACCU-CHEK GUIDE test strip Use to check blood sugar twice daily   Accu-Chek Softclix Lancets lancets Use to check blood sugar twice daily   acetaminophen (TYLENOL) 500 MG tablet Take 1,000 mg by mouth every 6 (six) hours as needed for moderate pain.    Artificial Tear Solution (GENTEAL TEARS) 0.1-0.2-0.3 % SOLN Place 1 drop into both eyes 3 (three) times daily as needed (dry/irritated eyes.).   aspirin 81 MG chewable tablet Chew 1 tablet (81 mg total) by mouth daily.   B COMPLEX-C PO Take 10 mLs by mouth daily. (Patient not taking: Reported on 03/10/2020)   baclofen (LIORESAL) 10 MG tablet TAKE 1 TO 2 TABLETS BY MOUTH TWICE DAILY AS NEEDED FOR MUSCLE SPASM   Blood Glucose Monitoring Suppl (ACCU-CHEK GUIDE) w/Device KIT Use to check blood sugar twice daily   cephALEXin (KEFLEX) 500 MG capsule Take 4 tablets (2000 mg) by mouth 60 minutes prior to any dental procedures or cleanings   Cholecalciferol  (VITAMIN D3) 125 MCG (5000 UT) TABS Take 5,000 Units by mouth daily.   clopidogrel (PLAVIX) 75 MG tablet Take 1 tablet (75 mg total) by mouth daily with breakfast.   Ferrous Sulfate (IRON) 325 (65 Fe) MG TABS TAKE 1 TABLET BY MOUTH TWICE DAILY WITH A MEAL   furosemide (LASIX) 20 MG tablet Take 1 tablet (20 mg total) by mouth daily as needed for edema.   gabapentin (NEURONTIN) 100 MG capsule Take 100 mg by mouth as needed.   hydrochlorothiazide (HYDRODIURIL) 25 MG tablet Take 1 tablet (25 mg total) by mouth daily. Please keep scheduled appointment for 05/03/2020   lactobacillus acidophilus (BACID) TABS tablet Take 2 tablets by mouth 2 (two) times daily as needed (digestive health.).    levocetirizine (XYZAL) 5 MG tablet TAKE 1 TABLET BY MOUTH ONCE DAILY IN THE EVENING   losartan (COZAAR) 50 MG tablet Take 1 tablet (50 mg total) by mouth daily.   metFORMIN (GLUCOPHAGE) 500 MG tablet TAKE 1 TABLET BY MOUTH ONCE DAILY WITH SUPPER   mupirocin ointment (BACTROBAN) 2 % Apply 1 application topically 2 (two) times daily.   simvastatin (ZOCOR) 20 MG tablet TAKE 1 TABLET BY MOUTH ONCE DAILY AT  6  PM   TRULICITY 6.24 EC/9.5QH SOPN Inject 0.75 mg into the skin once a week.   [DISCONTINUED] Cromolyn Sodium (NASAL ALLERGY NA) Place 1 spray into the nose daily as needed (allergies).   No facility-administered encounter medications on file as of 04/03/2020.     Objective:  BP Readings from Last 3 Encounters:  03/10/20 (!) 125/47  01/14/20  128/65  11/18/19 (!) 130/56    Goals Addressed              This Visit's Progress     RNCM: Pt-"I usually take my blood pressure daily" (pt-stated)        CARE PLAN ENTRY (see longtitudinal plan of care for additional care plan information)  Current Barriers:   Chronic Disease Management support, education, and care coordination needs related to HTN, HLD, DMII, and CKD Stage 2  Clinical Goal(s) related to HTN, HLD, DMII, and CKD Stage 2 :    Over the next 120 days, patient will:   Work with the care management team to address educational, disease management, and care coordination needs   Begin or continue self health monitoring activities as directed today Measure and record cbg (blood glucose) 3 times a week (last hemoglobin A1C 6.0), Measure and record blood pressure 3 times per week, and adhere to a heart healthy/ADA diet  Call provider office for new or worsened signs and symptoms Blood glucose findings outside established parameters, Blood pressure findings outside established parameters, Oxygen saturation lower than established parameter, Shortness of breath, and New or worsened symptom related to HLD, CKD2,  and other chronic conditions  Call care management team with questions or concerns  Verbalize basic understanding of patient centered plan of care established today  Interventions related to HTN, HLD, DMII, and CKD Stage 2 :   Evaluation of current treatment plans and patient's adherence to plan as established by provider.  12/16/2019: The patient is post TAVR and is doing well. The patient states she is feeling so much better, has more energy, and has made positive changes in her dietary habits. She was going to work with PT but due to the increase in [place] cases she has decided against that right now. She is however doing her own exercises at home and feels she is doing well. 04-03-2020: The patient saw the pcp on 03-10-2020: hemoglobin A1C elevated to 8.1.  The patient was started on Trulicity and her blood sugars are gradually coming down. The patient states that her range has been 125 to 147 in the last 2 weeks.  Assessed patient understanding of disease states.  The patient has a good understanding of her conditions. 12-16-2019: The patient states that her proceudre went well. Her blood pressures and blood sugars are stable. Her post TAVR EF% is 65%.  Her weight today was 224.  She says she is noticing she has more  energy. She says her daughter is not ready to give her the car keys yet but she feels she is progressing well. 04-03-2020: The patient is doing well and says her blood pressure is doing well. She continues to take on a regular basis. Has a follow up with specialist on 05-03-2020 at 0900 am.    Assessed patient's education and care coordination needs.  04-03-2020: Education on safety with rise in [place] 19 cases. The patient has been staying at home and limiting contact with others. Education about not getting outside when it is too hot. The patient has made positive changes in lifestyle modifications to help her health and well being.  Praised for accomplishments.    Provided disease specific education to patient.  04-03-2020: Review of heart healthy/ADA diet.  The patient endorses following a low sodium, low carb diet. She is enjoying fresh vegetables from the garden. She is eating well and maintaining her weight. The patient states she is hopeful to loose weight with the  medication changes for her blood sugars.   Education on fasting blood sugars being <130 and post prandial <180.  The patient is keeping a record and letting pcp know of changes. Denies any episodes of hypoglycemia.  Review of goal of hemoglobin A1C of <8.0. Education and support given.   Collaborated with appropriate clinical care team members regarding patient needs.  Patient denies any needs from the LCSW or pharmacist. Will continue to monitor. She knows CCM team is available to assist.   Evaluation of upcoming appointments. Sees the specialist next week on 05-03-2020. Will see pcp as needed. Last visit with pcp on 03-10-2020.   Patient Self Care Activities related to HTN, HLD, DMII, and CKD Stage 2 :   Patient is unable to independently self-manage chronic health conditions  Please see past updates related to this goal by clicking on the "Past Updates" button in the selected goal         There are no care plans to display  for this patient.   Plan:   Telephone follow up appointment with care management team member scheduled for: 05-22-2020 at 1:45 pm   Noreene Larsson RN, MSN, [place] Mobile: [phone]

## 2020-04-03 NOTE — Patient Instructions (Signed)
Visit Information  Goals Addressed              This Visit's Progress   .  RNCM: Pt-"I usually take my blood pressure daily" (pt-stated)        CARE PLAN ENTRY (see longtitudinal plan of care for additional care plan information)  Current Barriers:  . Chronic Disease Management support, education, and care coordination needs related to HTN, HLD, DMII, and CKD Stage 2  Clinical Goal(s) related to HTN, HLD, DMII, and CKD Stage 2 :  Over the next 120 days, patient will:  . Work with the care management team to address educational, disease management, and care coordination needs  . Begin or continue self health monitoring activities as directed today Measure and record cbg (blood glucose) 3 times a week (last hemoglobin A1C 6.0), Measure and record blood pressure 3 times per week, and adhere to a heart healthy/ADA diet . Call provider office for new or worsened signs and symptoms Blood glucose findings outside established parameters, Blood pressure findings outside established parameters, Oxygen saturation lower than established parameter, Shortness of breath, and New or worsened symptom related to HLD, CKD2,  and other chronic conditions . Call care management team with questions or concerns . Verbalize basic understanding of patient centered plan of care established today  Interventions related to HTN, HLD, DMII, and CKD Stage 2 :  . Evaluation of current treatment plans and patient's adherence to plan as established by provider.  12/16/2019: The patient is post TAVR and is doing well. The patient states she is feeling so much better, has more energy, and has made positive changes in her dietary habits. She was going to work with PT but due to the increase in [place] cases she has decided against that right now. She is however doing her own exercises at home and feels she is doing well. 04-03-2020: The patient saw the pcp on 03-10-2020: hemoglobin A1C elevated to 8.1.  The patient was started on  Trulicity and her blood sugars are gradually coming down. The patient states that her range has been 125 to 147 in the last 2 weeks. . Assessed patient understanding of disease states.  The patient has a good understanding of her conditions. 12-16-2019: The patient states that her proceudre went well. Her blood pressures and blood sugars are stable. Her post TAVR EF% is 65%.  Her weight today was 224.  She says she is noticing she has more energy. She says her daughter is not ready to give her the car keys yet but she feels she is progressing well. 04-03-2020: The patient is doing well and says her blood pressure is doing well. She continues to take on a regular basis. Has a follow up with specialist on 05-03-2020 at 0900 am.   . Assessed patient's education and care coordination needs.  04-03-2020: Education on safety with rise in [place] 19 cases. The patient has been staying at home and limiting contact with others. Education about not getting outside when it is too hot. The patient has made positive changes in lifestyle modifications to help her health and well being.  Praised for accomplishments.   . Provided disease specific education to patient.  04-03-2020: Review of heart healthy/ADA diet.  The patient endorses following a low sodium, low carb diet. She is enjoying fresh vegetables from the garden. She is eating well and maintaining her weight. The patient states she is hopeful to loose weight with the medication changes for her blood sugars.  Marland Kitchen  Education on fasting blood sugars being <130 and post prandial <180.  The patient is keeping a record and letting pcp know of changes. Denies any episodes of hypoglycemia.  Review of goal of hemoglobin A1C of <8.0. Education and support given.  Nash Dimmer with appropriate clinical care team members regarding patient needs.  Patient denies any needs from the LCSW or pharmacist. Will continue to monitor. She knows CCM team is available to assist.  . Evaluation  of upcoming appointments. Sees the specialist next week on 05-03-2020. Will see pcp as needed. Last visit with pcp on 03-10-2020.   Patient Self Care Activities related to HTN, HLD, DMII, and CKD Stage 2 :  . Patient is unable to independently self-manage chronic health conditions  Please see past updates related to this goal by clicking on the "Past Updates" button in the selected goal         The patient verbalized understanding of instructions, educational materials, and care plan provided today and declined offer to receive copy of patient instructions, educational materials, and care plan.   Telephone follow up appointment with care management team member scheduled for: 05-22-2020 1:45pm  Noreene Larsson RN, MSN, [place] Mobile: [phone]

## 2020-04-25 ENCOUNTER — Ambulatory Visit (INDEPENDENT_AMBULATORY_CARE_PROVIDER_SITE_OTHER): Payer: Medicare Other

## 2020-04-25 VITALS — Ht 60.0 in | Wt 229.0 lb

## 2020-04-25 DIAGNOSIS — Z0000 Encounter for general adult medical examination without abnormal findings: Secondary | ICD-10-CM | POA: Diagnosis not present

## 2020-04-25 NOTE — Progress Notes (Signed)
I connected with Camil Hausmann today by telephone and verified that I am speaking with the correct person using two identifiers. Location patient: home Location provider: work Persons participating in the virtual visit: [place] Crane, Glenna Durand LPN.   I discussed the limitations, risks, security and privacy concerns of performing an evaluation and management service by telephone and the availability of in person appointments. I also discussed with the patient that there may be a patient responsible charge related to this service. The patient expressed understanding and verbally consented to this telephonic visit.    Interactive audio and video telecommunications were attempted between this provider and patient, however failed, due to patient having technical difficulties OR patient did not have access to video capability.  We continued and completed visit with audio only.     Vital signs may be patient reported or missing.  Subjective:   Diana Harmon is a 74 y.o. female who presents for Medicare Annual (Subsequent) preventive examination.  Review of Systems     Cardiac Risk Factors include: advanced age (>82mn, >>3women);diabetes mellitus;dyslipidemia;hypertension;obesity (BMI >30kg/m2);sedentary lifestyle     Objective:    Today's Vitals   04/25/20 1042  Weight: 229 lb (103.9 kg)  Height: 5' (1.524 m)   Body mass index is 44.72 kg/m.  Advanced Directives 04/25/2020 11/10/2019 11/05/2019 11/04/2019 10/19/2019 10/12/2019 09/13/2019  Does Patient Have a Medical Advance Directive? _0  No No  Type of [place] Healthcare Power of [place]- -  Does patient want to make changes to medical advance directive? - No - Patient declined - - No - Patient declined - -  Copy of HSanta Annain Chart? Yes - validated  most recent copy scanned in chart (See row information) No - copy requested Yes - validated most recent copy scanned in chart (See row information) - No - copy requested - -  Would patient like information on creating a medical advance directive? - - - - - - Yes (MAU/Ambulatory/Procedural Areas - Information given)    Current Medications (verified) Outpatient Encounter Medications as of 04/25/2020  Medication Sig  . ACCU-CHEK GUIDE test strip Use to check blood sugar twice daily  . Accu-Chek Softclix Lancets lancets Use to check blood sugar twice daily  . acetaminophen (TYLENOL) 500 MG tablet Take 1,000 mg by mouth every 6 (six) hours as needed for moderate pain.   . Artificial Tear Solution (GENTEAL TEARS) 0.1-0.2-0.3 % SOLN Place 1 drop into both eyes 3 (three) times daily as needed (dry/irritated eyes.).  .Marland Kitchenaspirin 81 MG chewable tablet Chew 1 tablet (81 mg total) by mouth daily.  . B COMPLEX-C PO Take 10 mLs by mouth daily.  . baclofen (LIORESAL) 10 MG tablet TAKE 1 TO 2 TABLETS BY MOUTH TWICE DAILY AS NEEDED FOR MUSCLE SPASM  . Blood Glucose Monitoring Suppl (ACCU-CHEK GUIDE) w/Device KIT Use to check blood sugar twice daily  . cephALEXin (KEFLEX) 500 MG capsule Take 4 tablets (2000 mg) by mouth 60 minutes prior to any dental procedures or cleanings  . Cholecalciferol (VITAMIN D3) 125 MCG (5000 UT) TABS Take 5,000 Units by mouth daily.  . clopidogrel (PLAVIX) 75 MG tablet Take 1 tablet (75 mg total) by mouth daily with breakfast.  . Ferrous Sulfate (IRON) 325 (65 Fe) MG TABS TAKE 1 TABLET BY MOUTH TWICE DAILY WITH A MEAL  . hydrochlorothiazide (HYDRODIURIL) 25  MG tablet Take 1 tablet (25 mg total) by mouth daily. Please keep scheduled appointment for 05/03/2020  . lactobacillus acidophilus (BACID) TABS tablet Take 2 tablets by mouth 2 (two) times daily as needed (digestive health.).   Marland Kitchen levocetirizine (XYZAL) 5 MG tablet TAKE 1 TABLET BY MOUTH ONCE DAILY IN THE EVENING  . losartan (COZAAR)  50 MG tablet Take 1 tablet (50 mg total) by mouth daily.  . simvastatin (ZOCOR) 20 MG tablet TAKE 1 TABLET BY MOUTH ONCE DAILY AT  6  PM  . TRULICITY 5.05 WP/7.9YI SOPN Inject 0.75 mg into the skin once a week.  . furosemide (LASIX) 20 MG tablet Take 1 tablet (20 mg total) by mouth daily as needed for edema.  . gabapentin (NEURONTIN) 100 MG capsule Take 100 mg by mouth as needed. (Patient not taking: Reported on 04/25/2020)  . metFORMIN (GLUCOPHAGE) 500 MG tablet TAKE 1 TABLET BY MOUTH ONCE DAILY WITH SUPPER (Patient not taking: Reported on 04/25/2020)  . mupirocin ointment (BACTROBAN) 2 % Apply 1 application topically 2 (two) times daily. (Patient not taking: Reported on 04/25/2020)  . [DISCONTINUED] Cromolyn Sodium (NASAL ALLERGY NA) Place 1 spray into the nose daily as needed (allergies).   No facility-administered encounter medications on file as of 04/25/2020.    Allergies (verified) Ferumoxytol, Other, and Penicillins   History: Past Medical History:  Diagnosis Date  . Anemia   . Arthritis    legs, knees, back, wrists  . Diabetes mellitus without complication ([place])   . Hypertension   . Nausea   . Patient is [name]   . S/P TAVR (transcatheter aortic valve replacement) 11/09/2019   s/p TAVR with a 26 mm Edwards S3U via the TF approach by Dr. Angelena Form & Cyndia Bent  . Severe aortic stenosis   . SIRS (systemic inflammatory response syndrome) ([place]) 11/2019  . Thyroid disease    Past Surgical History:  Procedure Laterality Date  . CARDIAC CATHETERIZATION  10/19/2019  . COLON SURGERY     caudarized polyps; rectal carcinoid excision ~ 2011  . COLONOSCOPY    . COLONOSCOPY WITH PROPOFOL N/A 03/13/2015   Procedure: COLONOSCOPY WITH PROPOFOL;  Surgeon: Manya Silvas, MD;  Location: [facility] ENDOSCOPY;  Service: Endoscopy;  Laterality: N/A;  . ESOPHAGOGASTRODUODENOSCOPY N/A 03/13/2015   Procedure: ESOPHAGOGASTRODUODENOSCOPY (EGD);  Surgeon: Manya Silvas, MD;  [facility]  END[facility];  Service: Endoscopy;  Laterality: N/A;  . INTRAOPERATIVE TRANSTHORACIC ECHOCARDIOGRAM N/A 11/09/2019   Procedure: INTRAOPERATIVE TRANSTHORACIC ECHOCARDIOGRAM;  Surgeon: Burnell Blanks, MD;  Location: [facility];  Service: Open Heart Surgery;  Laterality: N/A;  . PARATHYROIDECTOMY  11/20/2004   right superior parathyroidecomy for primary hyperparathyroidism/parathyroid adenoma  . RIGHT/LEFT HEART CATH AND CORONARY ANGIOGRAPHY N/A 10/19/2019   Procedure: RIGHT/LEFT HEART CATH AND CORONARY ANGIOGRAPHY;  Surgeon: Nelva Bush, MD;  Location: [facility];  Service: Cardiovascular;  Laterality: N/A;  . THYROIDECTOMY    . TRANSCATHETER AORTIC VALVE REPLACEMENT, TRANSFEMORAL N/A 11/09/2019   Procedure: TRANSCATHETER AORTIC VALVE REPLACEMENT, TRANSFEMORAL;  Surgeon: Burnell Blanks, MD;  Location: [employer];  Service: Open Heart Surgery;  Laterality: N/A;   Family History  Problem Relation Age of Onset  . Parkinson's disease Mother   . Heart disease Father   . Lupus Sister   . Aneurysm Son   . Lupus Son   . Breast cancer Maternal Aunt        mat great aunt   Social History   Socioeconomic History  . Marital status: Divorced  Spouse name: Not on file  . Number of children: 2  . Years of education: 54  . Highest education level: 12th grade  Occupational History  . Occupation: retired-worked with nandicapped adults  Tobacco Use  . Smoking status: Former Smoker    Years: 20.00    Types: Cigarettes    Quit date: 09/05/1979    Years since quitting: 40.6  . Smokeless tobacco: Former Network engineer  . Vaping Use: Never used  Substance and Sexual Activity  . Alcohol use: Yes    Alcohol/week: 0.0 - 1.0 standard drinks    Comment: occ. wine  . Drug use: No  . Sexual activity: Not on file  Other Topics Concern  . Not on file  Social History Narrative  . Not on file   Social Determinants of Health   Financial Resource Strain: Low Risk   . Difficulty of  Paying Living Expenses: Not hard at all  Food Insecurity: No Food Insecurity  . Worried About Charity fundraiser in the Last Year: Never true  . Ran Out of Food in the Last Year: Never true  Transportation Needs: No Transportation Needs  . Lack of Transportation (Medical): No  . Lack of Transportation (Non-Medical): No  Physical Activity: Insufficiently Active  . Days of Exercise per Week: 2 days  . Minutes of Exercise per Session: 20 min  Stress: No Stress Concern Present  . Feeling of Stress : Not at all  Social Connections: Moderately Integrated  . Frequency of Communication with Friends and Family: More than three times a week  . Frequency of Social Gatherings with Friends and Family: Never  . Attends Religious Services: More than 4 times per year  . Active Member of Clubs or Organizations: Yes  . Attends Archivist Meetings: More than 4 times per year  . Marital Status: Divorced    Tobacco Counseling Counseling given: Not Answered   Clinical Intake:  Pre-visit preparation completed: Yes  Pain : No/denies pain     Nutritional Status: BMI > 30  Obese Nutritional Risks: None Diabetes: Yes  How often do you need to have someone help you when you read instructions, pamphlets, or other written materials from your doctor or pharmacy?: 1 - Never What is the last grade level you completed in school?: 12th grade  Diabetic? Yes Nutrition Risk Assessment:  Has the patient had any N/V/D within the last 2 months?  No  Does the patient have any non-healing wounds?  No  Has the patient had any unintentional weight loss or weight gain?  No   Diabetes:  Is the patient diabetic?  Yes  If diabetic, was a CBG obtained today?  No  Did the patient bring in their glucometer from home?  No  How often do you monitor your CBG's? Every other day.   Financial Strains and Diabetes Management:  Are you having any financial strains with the device, your supplies or your  medication? No .  Does the patient want to be seen by Chronic Care Management for management of their diabetes?  No  Would the patient like to be referred to a Nutritionist or for Diabetic Management?  No   Diabetic Exams:  Diabetic Eye Exam: Overdue for diabetic eye exam. Pt has been advised about the importance in completing this exam. Patient advised to call and schedule an eye exam. Diabetic Foot Exam: Completed 03/10/2020   Interpreter Needed?: No  Information entered by :: NAllen LPN  Activities of Daily Living In your present state of health, do you have any difficulty performing the following activities: 04/25/2020 11/11/2019  Hearing? Y N  Comment slight problem with right ear -  Vision? N N  Difficulty concentrating or making decisions? N N  Walking or climbing stairs? Y Y  Comment due arthritis -  Dressing or bathing? N N  Doing errands, shopping? N Y  Conservation officer, nature and eating ? N -  Using the Toilet? N -  In the past six months, have you accidently leaked urine? N -  Do you have problems with loss of bowel control? N -  Managing your Medications? N -  Managing your Finances? N -  Housekeeping or managing your Housekeeping? N -  Some recent data might be hidden    Patient Care Team: [name], DO as PCP - General (Family Medicine) Cammie Sickle, MD as Consulting Physician (Internal Medicine) Lorelee Cover., MD (Ophthalmology) End, Harrell Gave, MD as Consulting Physician (Cardiology) Vanita Ingles, RN as Case Manager ([place]) Cammie Sickle, MD as Consulting Physician (Internal Medicine)  Indicate any recent Medical Services you may have received from other than [facility] providers in the past year (date may be approximate).     Assessment:   This is a routine wellness examination for Asami.  Hearing/Vision screen  Hearing Screening   _0  _1  _2  _3  _4  _5  _6  _7  _8   Right ear:            Left ear:           Vision Screening Comments: Regular eye exams, Dr. Gloriann Loan  Dietary issues and exercise activities discussed: Current Exercise Habits: Home exercise routine, Type of exercise: Other - see comments (stationary bike), Time (Minutes): 20, Frequency (Times/Week): 2, Weekly Exercise (Minutes/Week): 40  Goals    .  DIET - INCREASE WATER INTAKE      Recommend drinking at least 6-8 glasses of water a day     .  DIET - REDUCE SUGAR INTAKE      Recommend to cut out desserts in daily diet to help aid in weight loss and control diabetes.     .  Patient Stated      04/25/2020, wants to lose weight (190-195)    .  RNCM: Pt-"I usually take my blood pressure daily" (pt-stated)      CARE PLAN ENTRY (see longtitudinal plan of care for additional care plan information)  Current Barriers:  . Chronic Disease Management support, education, and care coordination needs related to HTN, HLD, DMII, and CKD Stage 2  Clinical Goal(s) related to HTN, HLD, DMII, and CKD Stage 2 :  Over the next 120 days, patient will:  . Work with the care management team to address educational, disease management, and care coordination needs  . Begin or continue self health monitoring activities as directed today Measure and record cbg (blood glucose) 3 times a week (last hemoglobin A1C 6.0), Measure and record blood pressure 3 times per week, and adhere to a heart healthy/ADA diet . Call provider office for new or worsened signs and symptoms Blood glucose findings outside established parameters, Blood pressure findings outside established parameters, Oxygen saturation lower than established parameter, Shortness of breath, and New or worsened symptom related to HLD, CKD2,  and other chronic conditions . Call care management team with questions or concerns . Verbalize basic understanding of patient centered plan of care established today  Interventions related to HTN, HLD, DMII, and  CKD Stage 2 :  . Evaluation of  current treatment plans and patient's adherence to plan as established by provider.  12/16/2019: The patient is post TAVR and is doing well. The patient states she is feeling so much better, has more energy, and has made positive changes in her dietary habits. She was going to work with PT but due to the increase in COVID cases she has decided against that right now. She is however doing her own exercises at home and feels she is doing well. 04-03-2020: The patient saw the pcp on 03-10-2020: hemoglobin A1C elevated to 8.1.  The patient was started on Trulicity and her blood sugars are gradually coming down. The patient states that her range has been 125 to 147 in the last 2 weeks. . Assessed patient understanding of disease states.  The patient has a good understanding of her conditions. 12-16-2019: The patient states that her proceudre went well. Her blood pressures and blood sugars are stable. Her post TAVR EF% is 65%.  Her weight today was 224.  She says she is noticing she has more energy. She says her daughter is not ready to give her the car keys yet but she feels she is progressing well. 04-03-2020: The patient is doing well and says her blood pressure is doing well. She continues to take on a regular basis. Has a follow up with specialist on 05-03-2020 at 0900 am.   . Assessed patient's education and care coordination needs.  04-03-2020: Education on safety with rise in COVID 19 cases. The patient has been staying at home and limiting contact with others. Education about not getting outside when it is too hot. The patient has made positive changes in lifestyle modifications to help her health and well being.  Praised for accomplishments.   . Provided disease specific education to patient.  04-03-2020: Review of heart healthy/ADA diet.  The patient endorses following a low sodium, low carb diet. She is enjoying fresh vegetables from the garden. She is eating well and maintaining her weight. The patient states  she is hopeful to loose weight with the medication changes for her blood sugars.  . Education on fasting blood sugars being <130 and post prandial <180.  The patient is keeping a record and letting pcp know of changes. Denies any episodes of hypoglycemia.  Review of goal of hemoglobin A1C of <8.0. Education and support given.  Steele Sizer with appropriate clinical care team members regarding patient needs.  Patient denies any needs from the LCSW or pharmacist. Will continue to monitor. She knows CCM team is available to assist.  . Evaluation of upcoming appointments. Sees the specialist next week on 05-03-2020. Will see pcp as needed. Last visit with pcp on 03-10-2020.   Patient Self Care Activities related to HTN, HLD, DMII, and CKD Stage 2 :  . Patient is unable to independently self-manage chronic health conditions  Please see past updates related to this goal by clicking on the "Past Updates" button in the selected goal        Depression Screen PHQ 2/9 Scores 04/25/2020 03/10/2020 09/14/2019 09/13/2019 04/20/2019 04/14/2018 01/27/2018  PHQ - 2 Score 0 0 0 0 0 0 0    Fall Risk Fall Risk  04/25/2020 03/10/2020 03/10/2020 09/14/2019 04/20/2019  Falls in the past year? 0 0 0 0 0  Number falls in past yr: - 0 0 0 0  Injury with Fall? - 0 0 0 0  Risk for fall due to : Medication  side effect - - - -  Risk for fall due to: Comment - - - - -  Follow up Falls evaluation completed;Education provided;Falls prevention discussed Falls evaluation completed Falls evaluation completed Falls evaluation completed -    FALL RISK PREVENTION PERTAINING TO THE HOME:  Any stairs in or around the home? No  If so, are there any without handrails? n/a Home free of loose throw rugs in walkways, pet beds, electrical cords, etc? Yes  Adequate lighting in your home to reduce risk of falls? Yes   ASSISTIVE DEVICES UTILIZED TO PREVENT FALLS:  Life alert? No  Use of a cane, walker or w/c? Yes  Grab bars in the  bathroom? Yes  Shower chair or bench in shower? Yes  Elevated toilet seat or a handicapped toilet? Yes   TIMED UP AND GO:  Was the test performed? No .    Cognitive Function:     6CIT Screen 04/25/2020 04/14/2018 04/08/2017  What Year? 0 points 0 points 0 points  What month? 0 points 0 points 0 points  What time? 0 points 0 points 0 points  Count back from 20 0 points 0 points 0 points  Months in reverse 0 points 0 points 0 points  Repeat phrase 0 points 6 points 0 points  Total Score 0 6 0    Immunizations Immunization History  Administered Date(s) Administered  . Fluad Quad(high Dose 65+) 01/12/2019, 02/11/2020  . Influenza, High Dose Seasonal PF 02/09/2014, 01/05/2015, 01/30/2016, 01/14/2017  . Influenza,inj,Quad PF,6+ Mos 04/05/2013  . Influenza-Unspecified 02/03/2014, 02/03/2018  . Moderna Sars-Covid-2 Vaccination 09/25/2019, 10/23/2019  . Pneumococcal Conjugate-13 08/05/2014  . Pneumococcal Polysaccharide-23 04/05/2013    TDAP status: Up to date  Flu Vaccine status: Up to date  Pneumococcal vaccine status: Up to date  Covid-19 vaccine status: Completed vaccines  Qualifies for Shingles Vaccine? Yes   Zostavax completed No   Shingrix Completed?: No.    Education has been provided regarding the importance of this vaccine. Patient has been advised to call insurance company to determine out of pocket expense if they have not yet received this vaccine. Advised may also receive vaccine at local pharmacy or Health Dept. Verbalized acceptance and understanding.  Screening Tests Health Maintenance  Topic Date Due  . COVID-19 Vaccine (3 - Moderna risk 4-dose series) 11/20/2019  . MAMMOGRAM  04/01/2020  . OPHTHALMOLOGY EXAM  04/17/2020  . HEMOGLOBIN A1C  09/07/2020  . FOOT EXAM  03/10/2021  . DEXA SCAN  05/18/2024  . TETANUS/TDAP  12/04/2024  . COLONOSCOPY  03/12/2025  . INFLUENZA VACCINE  Completed  . Hepatitis C Screening  Completed  . PNA vac Low Risk Adult   Completed    Health Maintenance  Health Maintenance Due  Topic Date Due  . COVID-19 Vaccine (3 - Moderna risk 4-dose series) 11/20/2019  . MAMMOGRAM  04/01/2020  . OPHTHALMOLOGY EXAM  04/17/2020    Colorectal cancer screening: Type of screening: Colonoscopy. Completed 03/13/2015. Repeat every 10 years  Mammogram status: patient to schedule  Bone Density status: Completed 05/18/2014. Results reflect: Bone density results: NORMAL. Repeat every 0 years.  Lung Cancer Screening: (Low Dose CT Chest recommended if Age 12-80 years, 30 pack-year currently smoking OR have quit w/in 15years.) does not qualify.   Lung Cancer Screening Referral: no  Additional Screening:  Hepatitis C Screening: does qualify; Completed 07/21/2017  Vision Screening: Recommended annual ophthalmology exams for early detection of glaucoma and other disorders of the eye. Is the patient up to date  with their annual eye exam?  No  Who is the provider or what is the name of the office in which the patient attends annual eye exams? Dr. Gloriann Loan If pt is not established with a provider, would they like to be referred to a provider to establish care? No .   Dental Screening: Recommended annual dental exams for proper oral hygiene  Community Resource Referral / Chronic Care Management: CRR required this visit?  No   CCM required this visit?  No      Plan:     I have personally reviewed and noted the following in the patient's chart:   . Medical and social history . Use of alcohol, tobacco or illicit drugs  . Current medications and supplements . Functional ability and status . Nutritional status . Physical activity . Advanced directives . List of other physicians . Hospitalizations, surgeries, and ER visits in previous 12 months . Vitals . Screenings to include cognitive, depression, and falls . Referrals and appointments  In addition, I have reviewed and discussed with patient certain preventive protocols,  quality metrics, and best practice recommendations. A written personalized care plan for preventive services as well as general preventive health recommendations were provided to patient.     Kellie Simmering, LPN   [id number]   Nurse Notes:

## 2020-04-25 NOTE — Patient Instructions (Signed)
Diana Harmon , Thank you for taking time to come for your Medicare Wellness Visit. I appreciate your ongoing commitment to your health goals. Please review the following plan we discussed and let me know if I can assist you in the future.   Screening recommendations/referrals: Colonoscopy: completed 03/13/2015 Mammogram: patient to schedule Bone Density: completed 05/18/2014 Recommended yearly ophthalmology/optometry visit for glaucoma screening and checkup Recommended yearly dental visit for hygiene and checkup  Vaccinations: Influenza vaccine: completed 02/11/2020 Pneumococcal vaccine: completed 08/05/2014 Tdap vaccine: completed 09/01/2014 Shingles vaccine: discussed   Covid-19: 04/22/2020, 10/23/2019, 09/25/2019  Advanced directives: copy in chart  Conditions/risks identified: none  Next appointment: Follow up in one year for your annual wellness visit    Preventive Care 27 Years and Older, Female Preventive care refers to lifestyle choices and visits with your health care provider that can promote health and wellness. What does preventive care include?  A yearly physical exam. This is also called an annual well check.  Dental exams once or twice a year.  Routine eye exams. Ask your health care provider how often you should have your eyes checked.  Personal lifestyle choices, including:  Daily care of your teeth and gums.  Regular physical activity.  Eating a healthy diet.  Avoiding tobacco and drug use.  Limiting alcohol use.  Practicing safe sex.  Taking low-dose aspirin every day.  Taking vitamin and mineral supplements as recommended by your health care provider. What happens during an annual well check? The services and screenings done by your health care provider during your annual well check will depend on your age, overall health, lifestyle risk factors, and family history of disease. Counseling  Your health care provider may ask you questions about  your:  Alcohol use.  Tobacco use.  Drug use.  Emotional well-being.  Home and relationship well-being.  Sexual activity.  Eating habits.  History of falls.  Memory and ability to understand (cognition).  Work and work Statistician.  Reproductive health. Screening  You may have the following tests or measurements:  Height, weight, and BMI.  Blood pressure.  Lipid and cholesterol levels. These may be checked every 5 years, or more frequently if you are over 63 years old.  Skin check.  Lung cancer screening. You may have this screening every year starting at age 55 if you have a 30-pack-year history of smoking and currently smoke or have quit within the past 15 years.  Fecal occult blood test (FOBT) of the stool. You may have this test every year starting at age 63.  Flexible sigmoidoscopy or colonoscopy. You may have a sigmoidoscopy every 5 years or a colonoscopy every 10 years starting at age 26.  Hepatitis C blood test.  Hepatitis B blood test.  Sexually transmitted disease (STD) testing.  Diabetes screening. This is done by checking your blood sugar (glucose) after you have not eaten for a while (fasting). You may have this done every 1-3 years.  Bone density scan. This is done to screen for osteoporosis. You may have this done starting at age 77.  Mammogram. This may be done every 1-2 years. Talk to your health care provider about how often you should have regular mammograms. Talk with your health care provider about your test results, treatment options, and if necessary, the need for more tests. Vaccines  Your health care provider may recommend certain vaccines, such as:  Influenza vaccine. This is recommended every year.  Tetanus, diphtheria, and acellular pertussis (Tdap, Td) vaccine. You may need  a Td booster every 10 years.  Zoster vaccine. You may need this after age 107.  Pneumococcal 13-valent conjugate (PCV13) vaccine. One dose is recommended  after age 67.  Pneumococcal polysaccharide (PPSV23) vaccine. One dose is recommended after age 27. Talk to your health care provider about which screenings and vaccines you need and how often you need them. This information is not intended to replace advice given to you by your health care provider. Make sure you discuss any questions you have with your health care provider. Document Released: 05/19/2015 Document Revised: 01/10/2016 Document Reviewed: 02/21/2015 Elsevier Interactive Patient Education  2017 [place] Prevention in the Home Falls can cause injuries. They can happen to people of all ages. There are many things you can do to make your home safe and to help prevent falls. What can I do on the outside of my home?  Regularly fix the edges of walkways and driveways and fix any cracks.  Remove anything that might make you trip as you walk through a door, such as a raised step or threshold.  Trim any bushes or trees on the path to your home.  Use bright outdoor lighting.  Clear any walking paths of anything that might make someone trip, such as rocks or tools.  Regularly check to see if handrails are loose or broken. Make sure that both sides of any steps have handrails.  Any raised decks and porches should have guardrails on the edges.  Have any leaves, snow, or ice cleared regularly.  Use sand or salt on walking paths during winter.  Clean up any spills in your garage right away. This includes oil or grease spills. What can I do in the bathroom?  Use night lights.  Install grab bars by the toilet and in the tub and shower. Do not use towel bars as grab bars.  Use non-skid mats or decals in the tub or shower.  If you need to sit down in the shower, use a plastic, non-slip stool.  Keep the floor dry. Clean up any water that spills on the floor as soon as it happens.  Remove soap buildup in the tub or shower regularly.  Attach bath mats securely with  double-sided non-slip rug tape.  Do not have throw rugs and other things on the floor that can make you trip. What can I do in the bedroom?  Use night lights.  Make sure that you have a light by your bed that is easy to reach.  Do not use any sheets or blankets that are too big for your bed. They should not hang down onto the floor.  Have a firm chair that has side arms. You can use this for support while you get dressed.  Do not have throw rugs and other things on the floor that can make you trip. What can I do in the kitchen?  Clean up any spills right away.  Avoid walking on wet floors.  Keep items that you use a lot in easy-to-reach places.  If you need to reach something above you, use a strong step stool that has a grab bar.  Keep electrical cords out of the way.  Do not use floor polish or wax that makes floors slippery. If you must use wax, use non-skid floor wax.  Do not have throw rugs and other things on the floor that can make you trip. What can I do with my stairs?  Do not leave any items on the  stairs.  Make sure that there are handrails on both sides of the stairs and use them. Fix handrails that are broken or loose. Make sure that handrails are as long as the stairways.  Check any carpeting to make sure that it is firmly attached to the stairs. Fix any carpet that is loose or worn.  Avoid having throw rugs at the top or bottom of the stairs. If you do have throw rugs, attach them to the floor with carpet tape.  Make sure that you have a light switch at the top of the stairs and the bottom of the stairs. If you do not have them, ask someone to add them for you. What else can I do to help prevent falls?  Wear shoes that:  Do not have high heels.  Have rubber bottoms.  Are comfortable and fit you well.  Are closed at the toe. Do not wear sandals.  If you use a stepladder:  Make sure that it is fully opened. Do not climb a closed stepladder.  Make  sure that both sides of the stepladder are locked into place.  Ask someone to hold it for you, if possible.  Clearly mark and make sure that you can see:  Any grab bars or handrails.  First and last steps.  Where the edge of each step is.  Use tools that help you move around (mobility aids) if they are needed. These include:  Canes.  Walkers.  Scooters.  Crutches.  Turn on the lights when you go into a dark area. Replace any light bulbs as soon as they burn out.  Set up your furniture so you have a clear path. Avoid moving your furniture around.  If any of your floors are uneven, fix them.  If there are any pets around you, be aware of where they are.  Review your medicines with your doctor. Some medicines can make you feel dizzy. This can increase your chance of falling. Ask your doctor what other things that you can do to help prevent falls. This information is not intended to replace advice given to you by your health care provider. Make sure you discuss any questions you have with your health care provider. Document Released: 02/16/2009 Document Revised: 09/28/2015 Document Reviewed: 05/27/2014 Elsevier Interactive Patient Education  2017 [employer].

## 2020-05-02 NOTE — Progress Notes (Signed)
Follow-up Outpatient Visit Date: 05/03/2020  Primary Care Provider: [name], DO [address] [address]  Chief Complaint: Follow-up aortic valve disease  HPI:  Ms. Cataldo is a 74 y.o. female with history of severe aortic stenosis status post TAVR in 11/2019), hypertension, hyperlipidemia, type 2 diabetes mellitus, and thyroid disease who presents for follow-up of aortic stenosis.  She underwent successful TAVR placement on 11/09/2019 but was readmitted the next day with fever and SIRS.  Etiology was never identified.  She was feeling well at follow-up visit on 11/18/2019.  She noted periodic shortness of breath at virtual visit a month later as well as occasional lower extremity edema.  Today, Ms. Gasner reports that she has been doing quite well, much better following her valve TAVR in July.  She notes occasional leg edema that comes and goes.  She has mild exertional dyspnea, though she is primarily limited by arthritic pain, particularly in her back.  She has stable 3 pillow orthopnea.  She uses as needed furosemide about 2 times per month.  She has not had any chest pain, palpitations, or lightheadedness.  --------------------------------------------------------------------------------------------------  Past Medical History:  Diagnosis Date  . Anemia   . Arthritis    legs, knees, back, wrists  . Diabetes mellitus without complication ([place])   . Hypertension   . Nausea   . Patient is [name]   . S/P TAVR (transcatheter aortic valve replacement) 11/09/2019   s/p TAVR with a 26 mm Edwards S3U via the TF approach by Dr. Angelena Form & Cyndia Bent  . Severe aortic stenosis   . SIRS (systemic inflammatory response syndrome) ([place]) 11/2019  . Thyroid disease    Past Surgical History:  Procedure Laterality Date  . CARDIAC CATHETERIZATION  10/19/2019  . COLON SURGERY     caudarized polyps; rectal carcinoid excision ~ 2011  . COLONOSCOPY    . COLONOSCOPY WITH  PROPOFOL N/A 03/13/2015   Procedure: COLONOSCOPY WITH PROPOFOL;  Surgeon: Manya Silvas, MD;  [facility] ENDOSCOPY;  Service: Endoscopy;  Laterality: N/A;  . ESOPHAGOGASTRODUODENOSCOPY N/A 03/13/2015   Procedure: ESOPHAGOGASTRODUODENOSCOPY (EGD);  Surgeon: Manya Silvas, MD;  Location: [facility] ENDOSCOPY;  Service: Endoscopy;  Laterality: N/A;  . INTRAOPERATIVE TRANSTHORACIC ECHOCARDIOGRAM N/A 11/09/2019   Procedure: INTRAOPERATIVE TRANSTHORACIC ECHOCARDIOGRAM;  Surgeon: Burnell Blanks, MD;  Location: [place];  Service: Open Heart Surgery;  Laterality: N/A;  . PARATHYROIDECTOMY  11/20/2004   right superior parathyroidecomy for primary hyperparathyroidism/parathyroid adenoma  . RIGHT/LEFT HEART CATH AND CORONARY ANGIOGRAPHY N/A 10/19/2019   Procedure: RIGHT/LEFT HEART CATH AND CORONARY ANGIOGRAPHY;  Surgeon: Nelva Bush, MD;  Location: [place] [facility];  Service: Cardiovascular;  Laterality: N/A;  . THYROIDECTOMY    . TRANSCATHETER AORTIC VALVE REPLACEMENT, TRANSFEMORAL N/A 11/09/2019   Procedure: TRANSCATHETER AORTIC VALVE REPLACEMENT, TRANSFEMORAL;  Surgeon: Burnell Blanks, MD;  Location: [place];  Service: Open Heart Surgery;  Laterality: N/A;    Current Meds  Medication Sig  . ACCU-CHEK GUIDE test strip Use to check blood sugar twice daily  . Accu-Chek Softclix Lancets lancets Use to check blood sugar twice daily  . acetaminophen (TYLENOL) 500 MG tablet Take 1,000 mg by mouth every 6 (six) hours as needed for moderate pain.   . Artificial Tear Solution (GENTEAL TEARS) 0.1-0.2-0.3 % SOLN Place 1 drop into both eyes 3 (three) times daily as needed (dry/irritated eyes.).  Marland Kitchen aspirin 81 MG chewable tablet Chew 1 tablet (81 mg total) by mouth daily.  Marland Kitchen B  COMPLEX-C PO Take 10 mLs by mouth daily.  . baclofen (LIORESAL) 10 MG tablet TAKE 1 TO 2 TABLETS BY MOUTH TWICE DAILY AS NEEDED FOR MUSCLE SPASM  . Blood Glucose Monitoring Suppl (ACCU-CHEK GUIDE) w/Device KIT Use to check  blood sugar twice daily  . cephALEXin (KEFLEX) 500 MG capsule Take 4 tablets (2000 mg) by mouth 60 minutes prior to any dental procedures or cleanings  . Cholecalciferol (VITAMIN D3) 125 MCG (5000 UT) TABS Take 5,000 Units by mouth daily.  . clopidogrel (PLAVIX) 75 MG tablet Take 1 tablet (75 mg total) by mouth daily with breakfast.  . Ferrous Sulfate (IRON) 325 (65 Fe) MG TABS TAKE 1 TABLET BY MOUTH TWICE DAILY WITH A MEAL  . furosemide (LASIX) 20 MG tablet Take 1 tablet (20 mg total) by mouth daily as needed for edema.  . hydrochlorothiazide (HYDRODIURIL) 25 MG tablet Take 1 tablet (25 mg total) by mouth daily. Please keep scheduled appointment for 05/03/2020  . lactobacillus acidophilus (BACID) TABS tablet Take 2 tablets by mouth 2 (two) times daily as needed (digestive health.).   Marland Kitchen levocetirizine (XYZAL) 5 MG tablet TAKE 1 TABLET BY MOUTH ONCE DAILY IN THE EVENING  . losartan (COZAAR) 50 MG tablet Take 1 tablet (50 mg total) by mouth daily.  . mupirocin ointment (BACTROBAN) 2 % Apply 1 application topically 2 (two) times daily.  . simvastatin (ZOCOR) 20 MG tablet TAKE 1 TABLET BY MOUTH ONCE DAILY AT  6  PM  . TRULICITY 6.96 EX/5.2WU SOPN Inject 0.75 mg into the skin once a week.    Allergies: Ferumoxytol, Other, and Penicillins  Social History   Tobacco Use  . Smoking status: Former Smoker    Years: 20.00    Types: Cigarettes    Quit date: 09/05/1979    Years since quitting: 40.6  . Smokeless tobacco: Former Network engineer  . Vaping Use: Never used  Substance Use Topics  . Alcohol use: Yes    Alcohol/week: 0.0 - 1.0 standard drinks    Comment: occ. wine  . Drug use: No    Family History  Problem Relation Age of Onset  . Parkinson's disease Mother   . Heart disease Father   . Lupus Sister   . Aneurysm Son   . Lupus Son   . Breast cancer Maternal Aunt        mat great aunt    Review of Systems: A 12-system review of systems was performed and was negative except as  noted in the HPI.  --------------------------------------------------------------------------------------------------  Physical Exam: BP (!) 124/50 (BP Location: Right Arm, Patient Position: Sitting, Cuff Size: Large)   Pulse 93   Ht 5' (1.524 m)   Wt 227 lb (103 kg)   SpO2 97%   BMI 44.33 kg/m   General: NAD. Neck: No JVD or HJR. Lungs: Clear to auscultation bilateral without wheezes or crackles. Heart: Regular rate and rhythm with 1/6 systolic murmur.  No rubs or gallops. Abdomen: Soft, nontender, nondistended. Extremities: No lower extremity edema  EKG: Normal sinus rhythm with right bundle branch block.  No significant change from prior tracing on 11/18/2019.  Lab Results  Component Value Date   WBC 3.2 (L) 01/14/2020   HGB 10.4 (L) 01/14/2020   HCT 32.7 (L) 01/14/2020   MCV 93.2 01/14/2020   PLT 206 01/14/2020    Lab Results  Component Value Date   NA 138 01/14/2020   K 4.3 01/14/2020   CL 104 01/14/2020   CO2  22 01/14/2020   BUN 36 (H) 01/14/2020   CREATININE 1.19 (H) 01/14/2020   GLUCOSE 214 (H) 01/14/2020   ALT 29 11/12/2019    Lab Results  Component Value Date   CHOL 163 07/21/2017   HDL 68 07/21/2017   LDLCALC 77 07/21/2017   TRIG 95 07/21/2017   CHOLHDL 2.4 07/21/2017    --------------------------------------------------------------------------------------------------  ASSESSMENT AND PLAN: Aortic stenosis status post TAVR: Ms. Kipp Brood has improved considerably in regard to her symptoms following TAVR in 11/2019.  Continue current medications including 6 months of dual antiplatelet therapy with aspirin and clopidogrel.  SBE prophylaxis reinforced.  Follow-up with valve clinic as previously scheduled.  Given mild anemia following TAVR, I will check a CBC today.  Chronic HFpEF: Symptoms significantly improved following aortic valve intervention.  Ms. Kipp Brood reports NYHA class II symptoms and appears euvolemic on exam today.  She can continue to use  furosemide on an as-needed basis.  Hypertension: Blood pressure on the low side today with diastolic reading of 50.  Ms. Kipp Brood has been asymptomatic.  I have encouraged her to continue monitoring her blood pressure at home.  If it declines further or she develops symptoms of hypotension, she should reach out to [place] to discuss medication changes.  We will check a CMP today to ensure stable renal function and electrolytes.  Hyperlipidemia: We will check a lipid panel and CMP today to ensure appropriate response to statin therapy.  Goal LDL less than 70 in the setting of diabetes mellitus.  Follow-up: Return to see me in 1 year (she will follow-up in the valve clinic in ~6 months).  Nelva Bush, MD 05/03/2020 9:19 AM

## 2020-05-03 ENCOUNTER — Ambulatory Visit: Payer: Medicare Other | Admitting: Internal Medicine

## 2020-05-03 ENCOUNTER — Other Ambulatory Visit: Payer: Self-pay

## 2020-05-03 ENCOUNTER — Encounter: Payer: Self-pay | Admitting: Internal Medicine

## 2020-05-03 VITALS — BP 124/50 | HR 93 | Ht 60.0 in | Wt 227.0 lb

## 2020-05-03 DIAGNOSIS — I10 Essential (primary) hypertension: Secondary | ICD-10-CM

## 2020-05-03 DIAGNOSIS — I350 Nonrheumatic aortic (valve) stenosis: Secondary | ICD-10-CM

## 2020-05-03 DIAGNOSIS — I5032 Chronic diastolic (congestive) heart failure: Secondary | ICD-10-CM

## 2020-05-03 DIAGNOSIS — E1169 Type 2 diabetes mellitus with other specified complication: Secondary | ICD-10-CM

## 2020-05-03 DIAGNOSIS — Z952 Presence of prosthetic heart valve: Secondary | ICD-10-CM | POA: Diagnosis not present

## 2020-05-03 DIAGNOSIS — E785 Hyperlipidemia, unspecified: Secondary | ICD-10-CM

## 2020-05-03 MED ORDER — HYDROCHLOROTHIAZIDE 25 MG PO TABS: 25.0000 mg | ORAL_TABLET | Freq: Every day | ORAL | 3 refills | Status: DC

## 2020-05-03 MED ORDER — LOSARTAN POTASSIUM 50 MG PO TABS: 50.0000 mg | ORAL_TABLET | Freq: Every day | ORAL | 3 refills | Status: DC

## 2020-05-03 NOTE — Patient Instructions (Signed)
Medication Instructions:  Your physician recommends that you continue on your current medications as directed. Please refer to the Current Medication list given to you today.  *If you need a refill on your cardiac medications before your next appointment, please call your pharmacy*   Lab Work:  Your physician recommends that you have lab work TODAY: CBC, Cmet, Lipid panel  If you have labs (blood work) drawn today and your tests are completely normal, you will receive your results only by: Marland Kitchen MyChart Message (if you have MyChart) OR . A paper copy in the mail If you have any lab test that is abnormal or we need to change your treatment, we will call you to review the results.   Testing/Procedures: None ordered   Follow-Up: At [facility], you and your health needs are our priority.  As part of our continuing mission to provide you with exceptional heart care, we have created designated Provider Care Teams.  These Care Teams include your primary Cardiologist (physician) and Advanced Practice Providers (APPs -  Physician Assistants and Nurse Practitioners) who all work together to provide you with the care you need, when you need it.  We recommend signing up for the patient portal called "MyChart".  Sign up information is provided on this After Visit Summary.  MyChart is used to connect with patients for Virtual Visits (Telemedicine).  Patients are able to view lab/test results, encounter notes, upcoming appointments, etc.  Non-urgent messages can be sent to your provider as well.   To learn more about what you can do with MyChart, go to [email or web address].    Your next appointment:   1 year(s)  The format for your next appointment:   In Person  Provider:   You may see Dr. Okey Dupre or one of the following Advanced Practice Providers on your designated Care Team:    Nicolasa Ducking, NP  Eula Listen, PA-C  Marisue Ivan, PA-C  [place], [place]  Gillian Shields,  NP

## 2020-05-04 ENCOUNTER — Telehealth: Payer: Self-pay | Admitting: *Deleted

## 2020-05-04 ENCOUNTER — Encounter: Payer: Self-pay | Admitting: Internal Medicine

## 2020-05-04 DIAGNOSIS — Z952 Presence of prosthetic heart valve: Secondary | ICD-10-CM

## 2020-05-04 LAB — LIPID PANEL
Chol/HDL Ratio: 3.1 ratio (ref 0.0–4.4)
Cholesterol, Total: 170 mg/dL (ref 100–199)
HDL: 54 mg/dL (ref >39)
LDL Chol Calc (NIH): 97 mg/dL (ref 0–99)
Triglycerides: 103 mg/dL (ref 0–149)
VLDL Cholesterol Cal: 19 mg/dL (ref 5–40)

## 2020-05-04 LAB — CBC
Hematocrit: 32.2 % — ABNORMAL LOW (ref 34.0–46.6)
Hemoglobin: 10.2 g/dL — ABNORMAL LOW (ref 11.1–15.9)
MCH: 29.5 pg (ref 26.6–33.0)
MCHC: 31.7 g/dL (ref 31.5–35.7)
MCV: 93 fL (ref 79–97)
Platelets: 226 10*3/uL (ref 150–450)
RBC: 3.46 x10E6/uL — ABNORMAL LOW (ref 3.77–5.28)
RDW: 13.5 % (ref 11.7–15.4)
WBC: 3.7 10*3/uL (ref 3.4–10.8)

## 2020-05-04 LAB — COMPREHENSIVE METABOLIC PANEL
ALT: 26 IU/L (ref 0–32)
AST: 41 IU/L — ABNORMAL HIGH (ref 0–40)
Albumin/Globulin Ratio: 1.1 — ABNORMAL LOW (ref 1.2–2.2)
Albumin: 3.9 g/dL (ref 3.7–4.7)
Alkaline Phosphatase: 72 IU/L (ref 44–121)
BUN/Creatinine Ratio: 29 — ABNORMAL HIGH (ref 12–28)
BUN: 39 mg/dL — ABNORMAL HIGH (ref 8–27)
Bilirubin Total: 0.2 mg/dL (ref 0.0–1.2)
CO2: 23 mmol/L (ref 20–29)
Calcium: 9.7 mg/dL (ref 8.7–10.3)
Chloride: 102 mmol/L (ref 96–106)
Creatinine, Ser: 1.36 mg/dL — ABNORMAL HIGH (ref 0.57–1.00)
GFR calc Af Amer: 45 mL/min/{1.73_m2} — ABNORMAL LOW (ref >59)
GFR calc non Af Amer: 39 mL/min/{1.73_m2} — ABNORMAL LOW (ref >59)
Globulin, Total: 3.4 g/dL (ref 1.5–4.5)
Glucose: 212 mg/dL — ABNORMAL HIGH (ref 65–99)
Potassium: 4.2 mmol/L (ref 3.5–5.2)
Sodium: 140 mmol/L (ref 134–144)
Total Protein: 7.3 g/dL (ref 6.0–8.5)

## 2020-05-04 NOTE — Telephone Encounter (Signed)
Pt was previously referred to cardiac rehab and did not want to come in due to COVID concerns. I spoke with Cora Collum with cardiac rehab and she did confirm there is a virtual. Phone calls minimally every 2 weeks with access to EP, RN and RD. Notified pt they would like her to come in at least 2 times for the 6 min walk so we can provide a appropriate exercise prescription. Beginning and at the end of the program.  Pt verbalized understanding and states that in this case she would agree to proceed. Notified pt I will place a new referral and to expect a phone call from them to have set up in the next couple week. Pt has no further questions at this time.

## 2020-05-22 ENCOUNTER — Ambulatory Visit: Payer: Self-pay | Admitting: General Practice

## 2020-05-22 ENCOUNTER — Telehealth: Payer: Self-pay | Admitting: General Practice

## 2020-05-22 DIAGNOSIS — E1169 Type 2 diabetes mellitus with other specified complication: Secondary | ICD-10-CM

## 2020-05-22 DIAGNOSIS — I5032 Chronic diastolic (congestive) heart failure: Secondary | ICD-10-CM

## 2020-05-22 DIAGNOSIS — I10 Essential (primary) hypertension: Secondary | ICD-10-CM

## 2020-05-22 NOTE — Chronic Care Management (AMB) (Signed)
Chronic Care Management   CCM RN Visit Note  05/22/2020 Name: Diana Harmon MRN: [id number] DOB: 14-Nov-1945  Subjective: Diana Harmon is a 75 y.o. year old female who is a primary care patient of [name], DO. The care management team was consulted for assistance with disease management and care coordination needs.    Engaged with patient by telephone for follow up visit in response to provider referral for case management and/or care coordination services.   Consent to Services:  Currently engaged with CCM program.   Patient agreed to services and verbal consent obtained.   Assessment: Review of patient past medical history, allergies, medications, health status, including review of consultants reports, laboratory and other test data, was performed as part of comprehensive evaluation and provision of chronic care management services.   SDOH (Social Determinants of Health) assessments and interventions performed:    CCM Care Plan  Allergies  Allergen Reactions   Ferumoxytol Swelling    Swelling in neck   Other     Other reaction(s): Other (See Comments) She refuses blood products-Jehovah witness   Penicillins Rash, Swelling and Hives    Outpatient Encounter Medications as of 05/22/2020  Medication Sig   ACCU-CHEK GUIDE test strip Use to check blood sugar twice daily   Accu-Chek Softclix Lancets lancets Use to check blood sugar twice daily   acetaminophen (TYLENOL) 500 MG tablet Take 1,000 mg by mouth every 6 (six) hours as needed for moderate pain.    Artificial Tear Solution (GENTEAL TEARS) 0.1-0.2-0.3 % SOLN Place 1 drop into both eyes 3 (three) times daily as needed (dry/irritated eyes.).   aspirin 81 MG chewable tablet Chew 1 tablet (81 mg total) by mouth daily.   B COMPLEX-C PO Take 10 mLs by mouth daily.   baclofen (LIORESAL) 10 MG tablet TAKE 1 TO 2 TABLETS BY MOUTH TWICE DAILY AS NEEDED FOR MUSCLE SPASM   Blood Glucose  Monitoring Suppl (ACCU-CHEK GUIDE) w/Device KIT Use to check blood sugar twice daily   cephALEXin (KEFLEX) 500 MG capsule Take 4 tablets (2000 mg) by mouth 60 minutes prior to any dental procedures or cleanings   Cholecalciferol (VITAMIN D3) 125 MCG (5000 UT) TABS Take 5,000 Units by mouth daily.   clopidogrel (PLAVIX) 75 MG tablet Take 1 tablet (75 mg total) by mouth daily with breakfast.   Ferrous Sulfate (IRON) 325 (65 Fe) MG TABS TAKE 1 TABLET BY MOUTH TWICE DAILY WITH A MEAL   furosemide (LASIX) 20 MG tablet Take 1 tablet (20 mg total) by mouth daily as needed for edema.   hydrochlorothiazide (HYDRODIURIL) 25 MG tablet Take 1 tablet (25 mg total) by mouth daily.   lactobacillus acidophilus (BACID) TABS tablet Take 2 tablets by mouth 2 (two) times daily as needed (digestive health.).    levocetirizine (XYZAL) 5 MG tablet TAKE 1 TABLET BY MOUTH ONCE DAILY IN THE EVENING   losartan (COZAAR) 50 MG tablet Take 1 tablet (50 mg total) by mouth daily.   mupirocin ointment (BACTROBAN) 2 % Apply 1 application topically 2 (two) times daily.   simvastatin (ZOCOR) 20 MG tablet TAKE 1 TABLET BY MOUTH ONCE DAILY AT  6  PM   TRULICITY 9.51 OA/4.1YS SOPN Inject 0.75 mg into the skin once a week.   [DISCONTINUED] Cromolyn Sodium (NASAL ALLERGY NA) Place 1 spray into the nose daily as needed (allergies).   No facility-administered encounter medications on file as of 05/22/2020.    Patient Active Problem List  Diagnosis Date Noted   S/P TAVR (transcatheter aortic valve replacement) 11/09/2019   Patient is [name]    Severe aortic stenosis    Thyroid disease    Chronic heart failure with preserved ejection fraction (HFpEF) ([place]) 09/30/2019   Normocytic anemia 02/17/2019   Morbid obesity ([place]) 11/05/2017   Neutropenia ([place]) 04/24/2017   Vitamin D deficiency 10/08/2016   Meniere's disease of right ear 07/04/2016   TIA (transient ischemic attack) 07/04/2016    Nonrheumatic aortic valve stenosis 07/04/2016   Seasonal allergies 01/30/2016   Recurrent UTI 10/26/2015   Iron deficiency anemia due to chronic blood loss 02/28/2015   History of adenomatous polyp of colon 02/13/2015   Osteoarthritis of both knees 12/05/2014   Acid reflux 12/05/2014   Benign neoplasm of colon 12/05/2014   Type 2 diabetes mellitus with other specified complication ([place]) 12/06/2334   Essential hypertension 12/05/2014   Hyperlipidemia associated with type 2 diabetes mellitus ([place]) 12/05/2014   Arthritis, degenerative 12/05/2014   Anemia due to multiple mechanisms 12/05/2014   Abnormal liver enzymes 12/05/2014    Conditions to be addressed/monitored:CHF, HTN, HLD and DMII  Care Plan : RNCM: Heart Failure (Adult)  Updates made by Vanita Ingles since 05/22/2020 12:00 AM    Problem: RNCM: Management of HF   Priority: Medium    Goal: RNCM: HF Symptom Exacerbation Prevented or Minimized   Priority: Medium  Note:   Current Barriers:   Knowledge deficits related to basic heart failure pathophysiology and self care management  Unable to independently manage HF  Does not contact provider office for questions/concerns  Lack of scale in home- weighs every other day  Financial strain  Nurse Case Manager Clinical Goal(s):   Over the next 120 days, patient will weigh self daily and record  Over the next 120 days, patient will verbalize understanding of Heart Failure Action Plan and when to call doctor  Over the next 120 days, patient will take all Heart Failure mediations as prescribed Interventions:   Collaboration with [name], DO regarding development and update of comprehensive plan of care as evidenced by provider attestation and co-signature  Inter-disciplinary care team collaboration (see longitudinal plan of care)  Basic overview and discussion of pathophysiology of Heart Failure  Provided written and verbal education on low  sodium diet  Reviewed Heart Failure Action Plan in depth and provided written copy  Assessed for scales in home  Discussed importance of daily weight  Reviewed role of diuretics in prevention of fluid overload  Patient Goals/Self-Care Activities  Over the next 120 days, patient will:  - Take Heart Failure Medications as prescribed - Weigh daily and record (notify MD with 3 lb weight gain over night or 5 lb in a week) - Follow CHF Action Plan - Adhere to low sodium diet - barriers to lifestyle changes reviewed and addressed - barriers to treatment reviewed and addressed - self-awareness of signs/symptoms of worsening disease encouraged Follow Up Plan: Telephone follow up appointment with care management team member scheduled for: 07-17-2020 at 1:45 pm   Task: RNCM: Identify and Minimize Risk of Heart Failure Exacerbation   Note:   Care Management Activities:    - barriers to lifestyle changes reviewed and addressed - barriers to treatment reviewed and addressed - self-awareness of signs/symptoms of worsening disease encouraged       Care Plan : RNCM: Hypertension (Adult)  Updates made by Vanita Ingles since 05/22/2020 12:00 AM    Problem: RNCM: Hypertension (Hypertension)  Priority: Medium    Goal: RNCM: Hypertension Monitored   Note:   Objective:   Last practice recorded BP readings:  BP Readings from Last 3 Encounters:  05/03/20 (!) 124/50  03/10/20 (!) 125/47  01/14/20 128/65     Most recent eGFR/CrCl: No results found for: EGFR  No components found for: CRCL Current Barriers:   Knowledge Deficits related to basic understanding of hypertension pathophysiology and self care management  Knowledge Deficits related to understanding of medications prescribed for management of hypertension  Limited Social Support  Unable to independently manage HTN  Lacks social connections  Does not contact provider office for questions/concerns Case Manager Clinical  Goal(s):   Over the next 120 days, patient will verbalize understanding of plan for hypertension management  Over the next 120 days, patient will demonstrate improved adherence to prescribed treatment plan for hypertension as evidenced by taking all medications as prescribed, monitoring and recording blood pressure as directed, adhering to low sodium/DASH diet  Over the next 120 days, patient will demonstrate improved health management independence as evidenced by checking blood pressure as directed and notifying PCP if SBP>160 or DBP > 90, taking all medications as prescribe, and adhering to a low sodium diet as discussed. Interventions:   Collaboration with [name], DO regarding development and update of comprehensive plan of care as evidenced by provider attestation and co-signature  Inter-disciplinary care team collaboration (see longitudinal plan of care)  Evaluation of current treatment plan related to hypertension self management and patient's adherence to plan as established by provider.  Provided education to patient re: stroke prevention, s/s of heart attack and stroke, DASH diet, complications of uncontrolled blood pressure  Reviewed medications with patient and discussed importance of compliance  Discussed plans with patient for ongoing care management follow up and provided patient with direct contact information for care management team  Advised patient, providing education and rationale, to monitor blood pressure daily and record, calling PCP for findings outside established parameters.  Patient Goals/Self-Care Activities  Over the next 120 days, patient will:  - - blood pressure trends reviewed - depression screen reviewed - home or ambulatory blood pressure monitoring encouraged  Follow Up Plan: Telephone follow up appointment with care management team member scheduled for: 07-17-2020 at 1:45 pm   Task: RNCM: Identify and Monitor Blood Pressure  Elevation   Note:   Care Management Activities:    - blood pressure trends reviewed - depression screen reviewed - home or ambulatory blood pressure monitoring encouraged       Care Plan : RNCM: Diabetes Type 2 (Adult)  Updates made by Vanita Ingles since 05/22/2020 12:00 AM    Problem: RNCM: Glycemic Management (Diabetes, Type 2)   Priority: Medium    Goal: RNCM: Glycemic Management Optimized   Priority: Medium  Note:   Objective:  Lab Results  Component Value Date   HGBA1C 8.1 (A) 03/10/2020    Lab Results  Component Value Date   CREATININE 1.36 (H) 05/03/2020   CREATININE 1.19 (H) 01/14/2020   CREATININE 0.90 11/13/2019     No results found for: EGFR Current Barriers:   Knowledge Deficits related to basic Diabetes pathophysiology and self care/management  Knowledge Deficits related to medications used for management of diabetes  Limited Social Support  Unable to independently manage DM  Lacks social connections  Does not contact provider office for questions/concerns Case Manager Clinical Goal(s):   Collaboration with [name], DO regarding development and update of comprehensive plan  of care as evidenced by provider attestation and co-signature  Inter-disciplinary care team collaboration (see longitudinal plan of care)  Over the next 120 days, patient will demonstrate improved adherence to prescribed treatment plan for diabetes self care/management as evidenced by:   daily monitoring and recording of CBG   adherence to ADA/ carb modified diet  adherence to prescribed medication regimen Interventions:   Provided education to patient about basic DM disease process  Reviewed medications with patient and discussed importance of medication adherence  Discussed plans with patient for ongoing care management follow up and provided patient with direct contact information for care management team  Provided patient with written educational  materials related to hypo and hyperglycemia and importance of correct treatment  Review of patient status, including review of consultants reports, relevant laboratory and other test results, and medications completed. Patient Goals/Self-Care Activities  Over the next 120 days, patient will:  - Self administers oral medications as prescribed Attends all scheduled provider appointments Checks blood sugars as prescribed and utilize hyper and hypoglycemia protocol as needed Adheres to prescribed ADA/carb modified - barriers to adherence to treatment plan identified - blood glucose readings reviewed- range is 130 to 141 - mutual A1C goal set or reviewed - resources required to improve adherence to care identified - self-awareness of signs/symptoms of hypo or hyperglycemia encouraged - use of blood glucose monitoring log promoted Follow Up Plan: Telephone follow up appointment with care management team member scheduled for: 07-17-2020 at 1:45 pm   Task: RNCM: Alleviate Barriers to Glycemic Management   Note:   Care Management Activities:    - barriers to adherence to treatment plan identified - blood glucose readings reviewed - mutual A1C goal set or reviewed - resources required to improve adherence to care identified - self-awareness of signs/symptoms of hypo or hyperglycemia encouraged - use of blood glucose monitoring log promoted       Care Plan : RNCM: HLD managment  Updates made by Vanita Ingles since 05/22/2020 12:00 AM    Problem: RNCM: HLD management   Priority: Medium    Goal: RNCM: HLD Health Literacy Improved   Priority: Medium  Note:   Current Barriers:   Poorly controlled hyperlipidemia, complicated by HTN, DM  Current antihyperlipidemic regimen: Zocor 28m QD  Most recent lipid panel:     Component Value Date/Time   CHOL 170 05/03/2020 0935   TRIG 103 05/03/2020 0935   HDL 54 05/03/2020 0935   CHOLHDL 3.1 05/03/2020 0935   CHOLHDL 2.4 07/21/2017 0000    VLDL 26 06/03/2016 0001   LDLCALC 97 05/03/2020 0935   LDLCALC 77 07/21/2017 0000     ASCVD risk enhancing conditions: age >>61 DM, HTN, CHF  Unable to independently manage HLD   Lacks social connections  Does not contact provider office for questions/concerns  RN Care Manager Clinical Goal(s):   Over the next 120 days, patient will work with RConsulting civil engineer providers, and care team towards execution of optimized self-health management plan  Over the next 120 days, patient will verbalize understanding of plan for effective management of HLD  Over the next 120 days, patient will work with RNCM and pcp to address needs related to compliance, heart healthy diet, and working with the CCM team for optimal health and well being.   Interventions:  Collaboration with KOlin Hauser DO regarding development and update of comprehensive plan of care as evidenced by provider attestation and co-signature  Inter-disciplinary care team collaboration (see longitudinal plan  of care)  Medication review performed; medication list updated in electronic medical record.   Inter-disciplinary care team collaboration (see longitudinal plan of care)  Referred to pharmacy team for assistance with HLD medication management  Evaluation of current treatment plan related to HLD and patient's adherence to plan as established by provider.  Advised patient to call the office for changes or questions   Patient Goals/Self-Care Activities:  Over the next 120 days, patient will:   - call for medicine refill 2 or 3 days before it runs out - call if I am sick and can't take my medicine - keep a list of all the medicines I take; vitamins and herbals too - learn to read medicine labels - use a pillbox to sort medicine - drink 6 to 8 glasses of water each day - eat 5 or 6 small meals each day - fill half the plate with nonstarchy vegetables - limit fast food meals to no more than 1 per week -  manage portion size - read food labels for fat, fiber, carbohydrates and portion size - be open to making changes - I can manage, know and watch for signs of a heart attack - if I have chest pain, call for help - learn about small changes that will make a big difference - learn my personal risk factors  - patient's preferred learning methods utilized - questions encouraged - readiness to learn monitored  Follow Up Plan: Telephone follow up appointment with care management team member scheduled for: 07-17-2020 at 1:45 pm     Task: RNCM: Identify Deficit and Optimize Health Literacy   Note:   Care Management Activities:    - patient's preferred learning methods utilized - questions encouraged - readiness to learn monitored         Plan:Telephone follow up appointment with care management team member scheduled for:  07-17-2020 at 1:45 pm  Noreene Larsson RN, MSN, [facility] Mobile: [phone]

## 2020-05-22 NOTE — Patient Instructions (Signed)
Visit Information  Patient Care Plan: RNCM: Heart Failure (Adult)    Problem Identified: RNCM: Management of HF   Priority: Medium    Goal: RNCM: HF Symptom Exacerbation Prevented or Minimized   Priority: Medium  Note:   Current Barriers:  Marland Kitchen Knowledge deficits related to basic heart failure pathophysiology and self care management . Unable to independently manage HF . Does not contact provider office for questions/concerns . Lack of scale in home- weighs every other day . Financial strain  Occupational hygienist):   Over the next 120 days, patient will weigh self daily and record  Over the next 120 days, patient will verbalize understanding of Heart Failure Action Plan and when to call doctor  Over the next 120 days, patient will take all Heart Failure mediations as prescribed Interventions:  . Collaboration with [name], DO regarding development and update of comprehensive plan of care as evidenced by provider attestation and co-signature . Inter-disciplinary care team collaboration (see longitudinal plan of care) . Basic overview and discussion of pathophysiology of Heart Failure . Provided written and verbal education on low sodium diet . Reviewed Heart Failure Action Plan in depth and provided written copy . Assessed for scales in home . Discussed importance of daily weight . Reviewed role of diuretics in prevention of fluid overload  Patient Goals/Self-Care Activities . Over the next 120 days, patient will:  - Take Heart Failure Medications as prescribed - Weigh daily and record (notify MD with 3 lb weight gain over night or 5 lb in a week) - Follow CHF Action Plan - Adhere to low sodium diet - barriers to lifestyle changes reviewed and addressed - barriers to treatment reviewed and addressed - self-awareness of signs/symptoms of worsening disease encouraged Follow Up Plan: Telephone follow up appointment with care management team member  scheduled for: 07-17-2020 at 1:45 pm   Task: RNCM: Identify and Minimize Risk of Heart Failure Exacerbation   Note:   Care Management Activities:    - barriers to lifestyle changes reviewed and addressed - barriers to treatment reviewed and addressed - self-awareness of signs/symptoms of worsening disease encouraged       Patient Care Plan: RNCM: Hypertension (Adult)    Problem Identified: RNCM: Hypertension (Hypertension)   Priority: Medium    Goal: RNCM: Hypertension Monitored   Note:   Objective:  . Last practice recorded BP readings:  BP Readings from Last 3 Encounters:  05/03/20 (!) 124/50  03/10/20 (!) 125/47  01/14/20 128/65 .   Marland Kitchen Most recent eGFR/CrCl: No results found for: EGFR  No components found for: CRCL Current Barriers:  Marland Kitchen Knowledge Deficits related to basic understanding of hypertension pathophysiology and self care management . Knowledge Deficits related to understanding of medications prescribed for management of hypertension . Limited Social Support . Unable to independently manage HTN . Lacks social connections . Does not contact provider office for questions/concerns Case Manager Clinical Goal(s):  Marland Kitchen Over the next 120 days, patient will verbalize understanding of plan for hypertension management . Over the next 120 days, patient will demonstrate improved adherence to prescribed treatment plan for hypertension as evidenced by taking all medications as prescribed, monitoring and recording blood pressure as directed, adhering to low sodium/DASH diet . Over the next 120 days, patient will demonstrate improved health management independence as evidenced by checking blood pressure as directed and notifying PCP if SBP>160 or DBP > 90, taking all medications as prescribe, and adhering to a low sodium diet as  discussed. Interventions:  . Collaboration with [name], DO regarding development and update of comprehensive plan of care as evidenced by  provider attestation and co-signature . Inter-disciplinary care team collaboration (see longitudinal plan of care) . Evaluation of current treatment plan related to hypertension self management and patient's adherence to plan as established by provider. . Provided education to patient re: stroke prevention, s/s of heart attack and stroke, DASH diet, complications of uncontrolled blood pressure . Reviewed medications with patient and discussed importance of compliance . Discussed plans with patient for ongoing care management follow up and provided patient with direct contact information for care management team . Advised patient, providing education and rationale, to monitor blood pressure daily and record, calling PCP for findings outside established parameters.  Patient Goals/Self-Care Activities . Over the next 120 days, patient will:  - - blood pressure trends reviewed - depression screen reviewed - home or ambulatory blood pressure monitoring encouraged  Follow Up Plan: Telephone follow up appointment with care management team member scheduled for: 07-17-2020 at 1:45 pm   Task: RNCM: Identify and Monitor Blood Pressure Elevation   Note:   Care Management Activities:    - blood pressure trends reviewed - depression screen reviewed - home or ambulatory blood pressure monitoring encouraged       Patient Care Plan: RNCM: Diabetes Type 2 (Adult)    Problem Identified: RNCM: Glycemic Management (Diabetes, Type 2)   Priority: Medium    Goal: RNCM: Glycemic Management Optimized   Priority: Medium  Note:   Objective:  Lab Results  Component Value Date   HGBA1C 8.1 (A) 03/10/2020 .   Lab Results  Component Value Date   CREATININE 1.36 (H) 05/03/2020   CREATININE 1.19 (H) 01/14/2020   CREATININE 0.90 11/13/2019 .   Marland Kitchen No results found for: EGFR Current Barriers:  Marland Kitchen Knowledge Deficits related to basic Diabetes pathophysiology and self care/management . Knowledge Deficits related  to medications used for management of diabetes . Limited Social Support . Unable to independently manage DM . Lacks social connections . Does not contact provider office for questions/concerns Case Manager Clinical Goal(s):  Marland Kitchen Collaboration with [name], DO regarding development and update of comprehensive plan of care as evidenced by provider attestation and co-signature . Inter-disciplinary care team collaboration (see longitudinal plan of care) . Over the next 120 days, patient will demonstrate improved adherence to prescribed treatment plan for diabetes self care/management as evidenced by:  . daily monitoring and recording of CBG  . adherence to ADA/ carb modified diet . adherence to prescribed medication regimen Interventions:  . Provided education to patient about basic DM disease process . Reviewed medications with patient and discussed importance of medication adherence . Discussed plans with patient for ongoing care management follow up and provided patient with direct contact information for care management team . Provided patient with written educational materials related to hypo and hyperglycemia and importance of correct treatment . Review of patient status, including review of consultants reports, relevant laboratory and other test results, and medications completed. Patient Goals/Self-Care Activities . Over the next 120 days, patient will:  - Self administers oral medications as prescribed Attends all scheduled provider appointments Checks blood sugars as prescribed and utilize hyper and hypoglycemia protocol as needed Adheres to prescribed ADA/carb modified - barriers to adherence to treatment plan identified - blood glucose readings reviewed- range is 130 to 141 - mutual A1C goal set or reviewed - resources required to improve adherence to care identified -  self-awareness of signs/symptoms of hypo or hyperglycemia encouraged - use of blood glucose  monitoring log promoted Follow Up Plan: Telephone follow up appointment with care management team member scheduled for: 07-17-2020 at 1:45 pm   Task: RNCM: Alleviate Barriers to Glycemic Management   Note:   Care Management Activities:    - barriers to adherence to treatment plan identified - blood glucose readings reviewed - mutual A1C goal set or reviewed - resources required to improve adherence to care identified - self-awareness of signs/symptoms of hypo or hyperglycemia encouraged - use of blood glucose monitoring log promoted       Patient Care Plan: RNCM: HLD managment    Problem Identified: RNCM: HLD management   Priority: Medium    Goal: RNCM: HLD Health Literacy Improved   Priority: Medium  Note:   Current Barriers:  . Poorly controlled hyperlipidemia, complicated by HTN, DM . Current antihyperlipidemic regimen: Zocor 32m QD . Most recent lipid panel:     Component Value Date/Time   CHOL 170 05/03/2020 0935   TRIG 103 05/03/2020 0935   HDL 54 05/03/2020 0935   CHOLHDL 3.1 05/03/2020 0935   CHOLHDL 2.4 07/21/2017 0000   VLDL 26 06/03/2016 0001   LDLCALC 97 05/03/2020 0935   LDLCALC 77 07/21/2017 0000 .   .Marland KitchenASCVD risk enhancing conditions: age >>33 DM, HTN, CHF . Unable to independently manage HLD  . Lacks social connections . Does not contact provider office for questions/concerns  RN Care Manager Clinical Goal(s):  .Marland KitchenOver the next 120 days, patient will work with RConsulting civil engineer providers, and care team towards execution of optimized self-health management plan . Over the next 120 days, patient will verbalize understanding of plan for effective management of HLD . Over the next 120 days, [facility] to address needs related to compliance, heart healthy diet, and working with the CCM team for optimal health and well being.   Interventions: . Collaboration with KOlin Hauser DO regarding development and update of  comprehensive plan of care as evidenced by provider attestation and co-signature . Inter-disciplinary care team collaboration (see longitudinal plan of care) . Medication review performed; medication list updated in electronic medical record.  .[facility] team collaboration (see longitudinal plan of care) . Referred to pharmacy team for assistance with HLD medication management . Evaluation of current treatment plan related to HLD and patient's adherence to plan as established by provider. . Advised patient to call the office for changes or questions   Patient Goals/Self-Care Activities: . Over the next 120 days, patient will:   - call for medicine refill 2 or 3 days before it runs out - call if I am sick and can't take my medicine - keep a list of all the medicines I take; vitamins and herbals too - learn to read medicine labels - use a pillbox to sort medicine - drink 6 to 8 glasses of water each day - eat 5 or 6 small meals each day - fill half the plate with nonstarchy vegetables - limit fast food meals to no more than 1 per week - manage portion size - read food labels for fat, fiber, carbohydrates and portion size - be open to making changes - I can manage, know and watch for signs of a heart attack - if I have chest pain, call for help - learn about small changes that will make a big difference - learn my personal risk factors  -  patient's preferred learning methods utilized - questions encouraged - readiness to learn monitored  Follow Up Plan: Telephone follow up appointment with care management team member scheduled for: 07-17-2020 at 1:45 pm     Task: RNCM: Identify Deficit and Optimize Health Literacy   Note:   Care Management Activities:    - patient's preferred learning methods utilized - questions encouraged - readiness to learn monitored         Patient verbalizes understanding of instructions provided today.  Telephone follow up  appointment with care management team member scheduled for: 07-17-2020 at 1:45 pm  Noreene Larsson RN, MSN, [place] Mobile: [phone]

## 2020-06-15 ENCOUNTER — Other Ambulatory Visit: Payer: Self-pay | Admitting: Family Medicine

## 2020-06-15 DIAGNOSIS — E1169 Type 2 diabetes mellitus with other specified complication: Secondary | ICD-10-CM

## 2020-06-15 MED ORDER — TRULICITY 0.75 MG/0.5ML ~~LOC~~ SOAJ: 0.7500 mg | SUBCUTANEOUS | 2 refills | Status: DC

## 2020-06-15 NOTE — Telephone Encounter (Signed)
Copied from [place] [id number]. Topic: Quick Communication - Rx Refill/Question >> Jun 15, 2020  2:23 PM Tessa Lerner A wrote: Medication: TRULICITY 0.22 VV/6.1QA SOPN  Has the patient contacted their pharmacy? No. Patient was unsure of the amount of refills remaining for prescription   Preferred Pharmacy (with phone number or street name): [facility], [place]  Phone:  [phone]  Agent: Please be advised that RX refills may take up to 3 business days. We ask that you follow-up with your pharmacy.

## 2020-06-16 IMAGING — CR DG CHEST 2V
1 series · 2 of 2 positions shown · non-contrast
Comparison: None.

CLINICAL DATA: Pt states she has a decreased white blood count,
right side chest pain, fatigue and 28 LB weight loss since [REDACTED].
History of HTN,diabetic, former smoker. shieldedfatigue

EXAM:
CHEST - 2 VIEW

[Series 1: dg chest 2 view · 0.14mm/px · 2 of 2 slices shown]
[im 1/2]
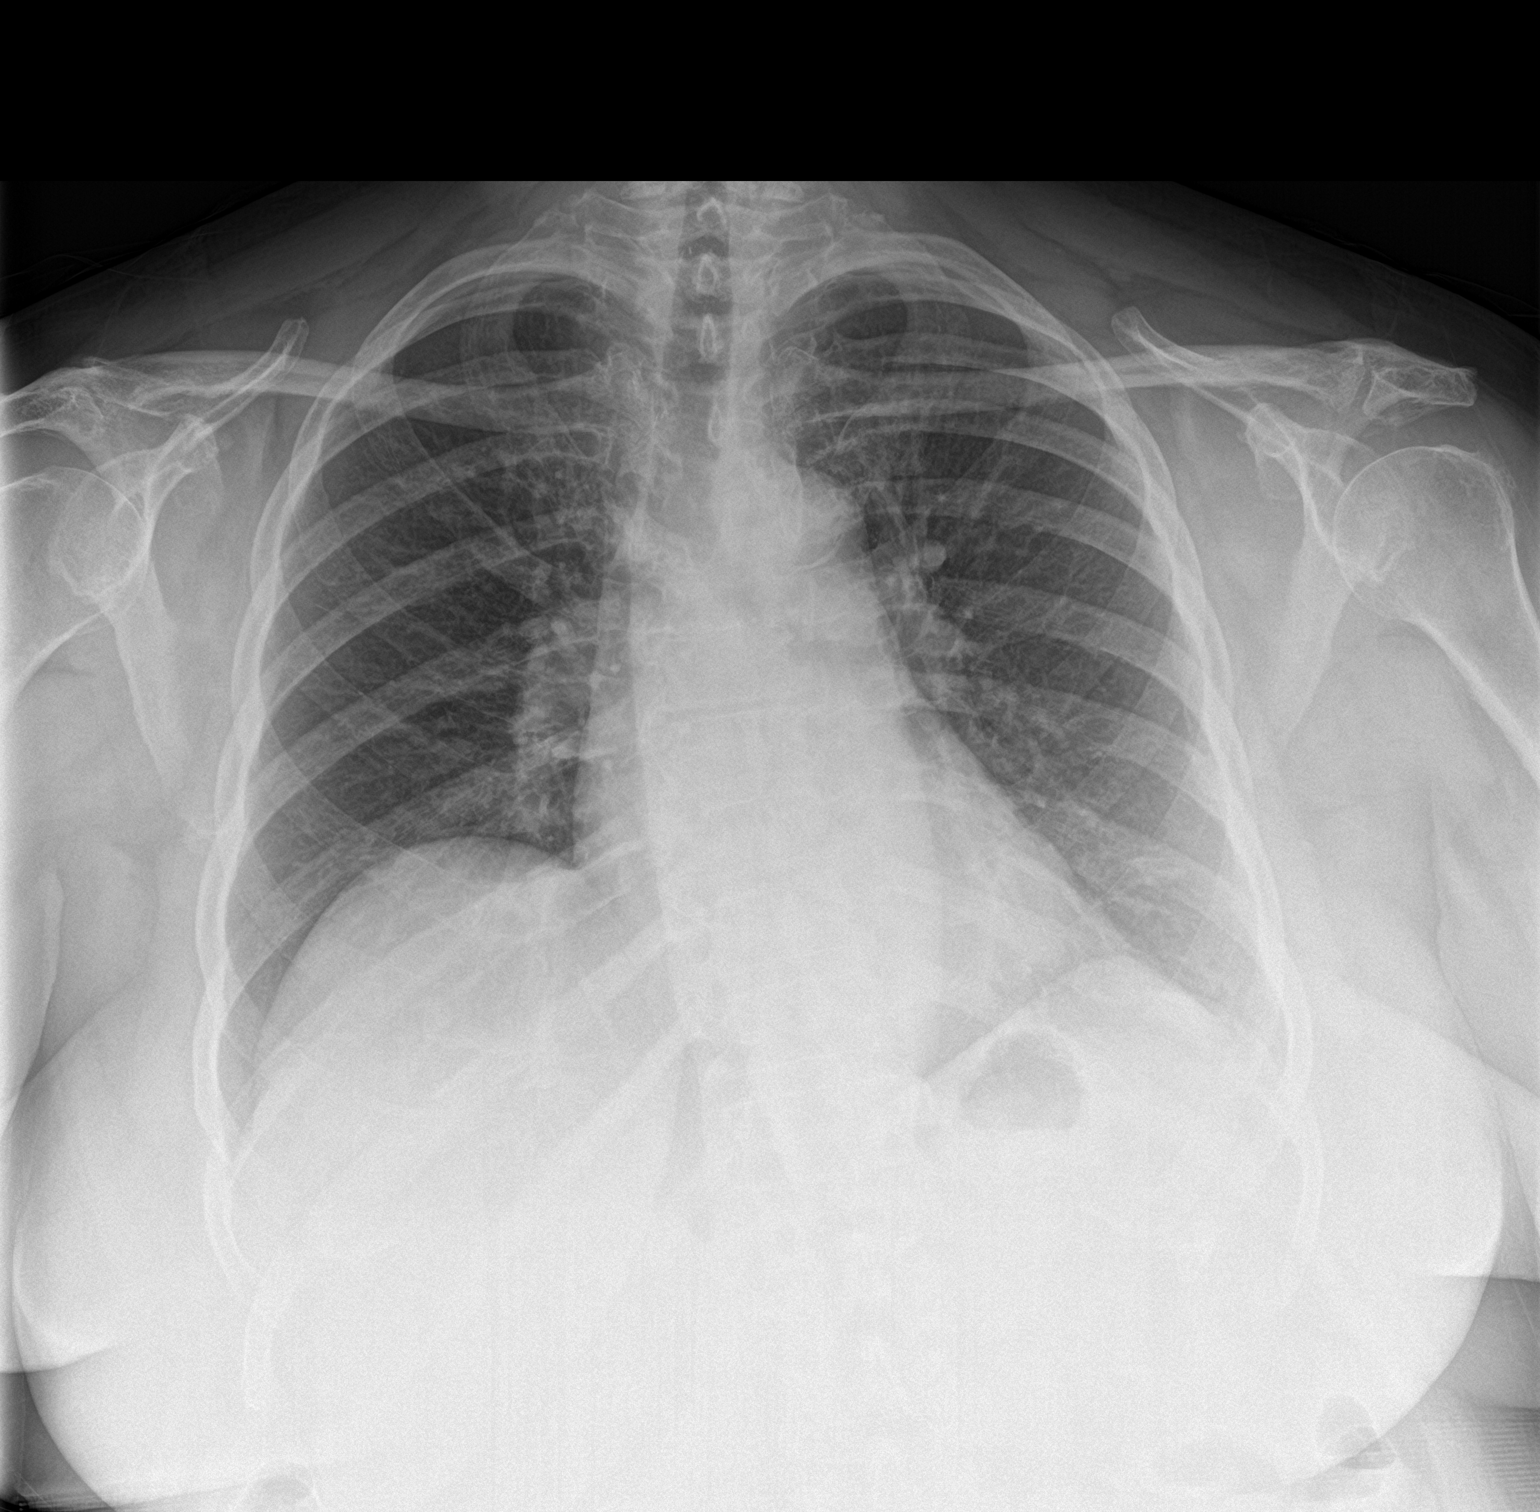
[im 2/2]
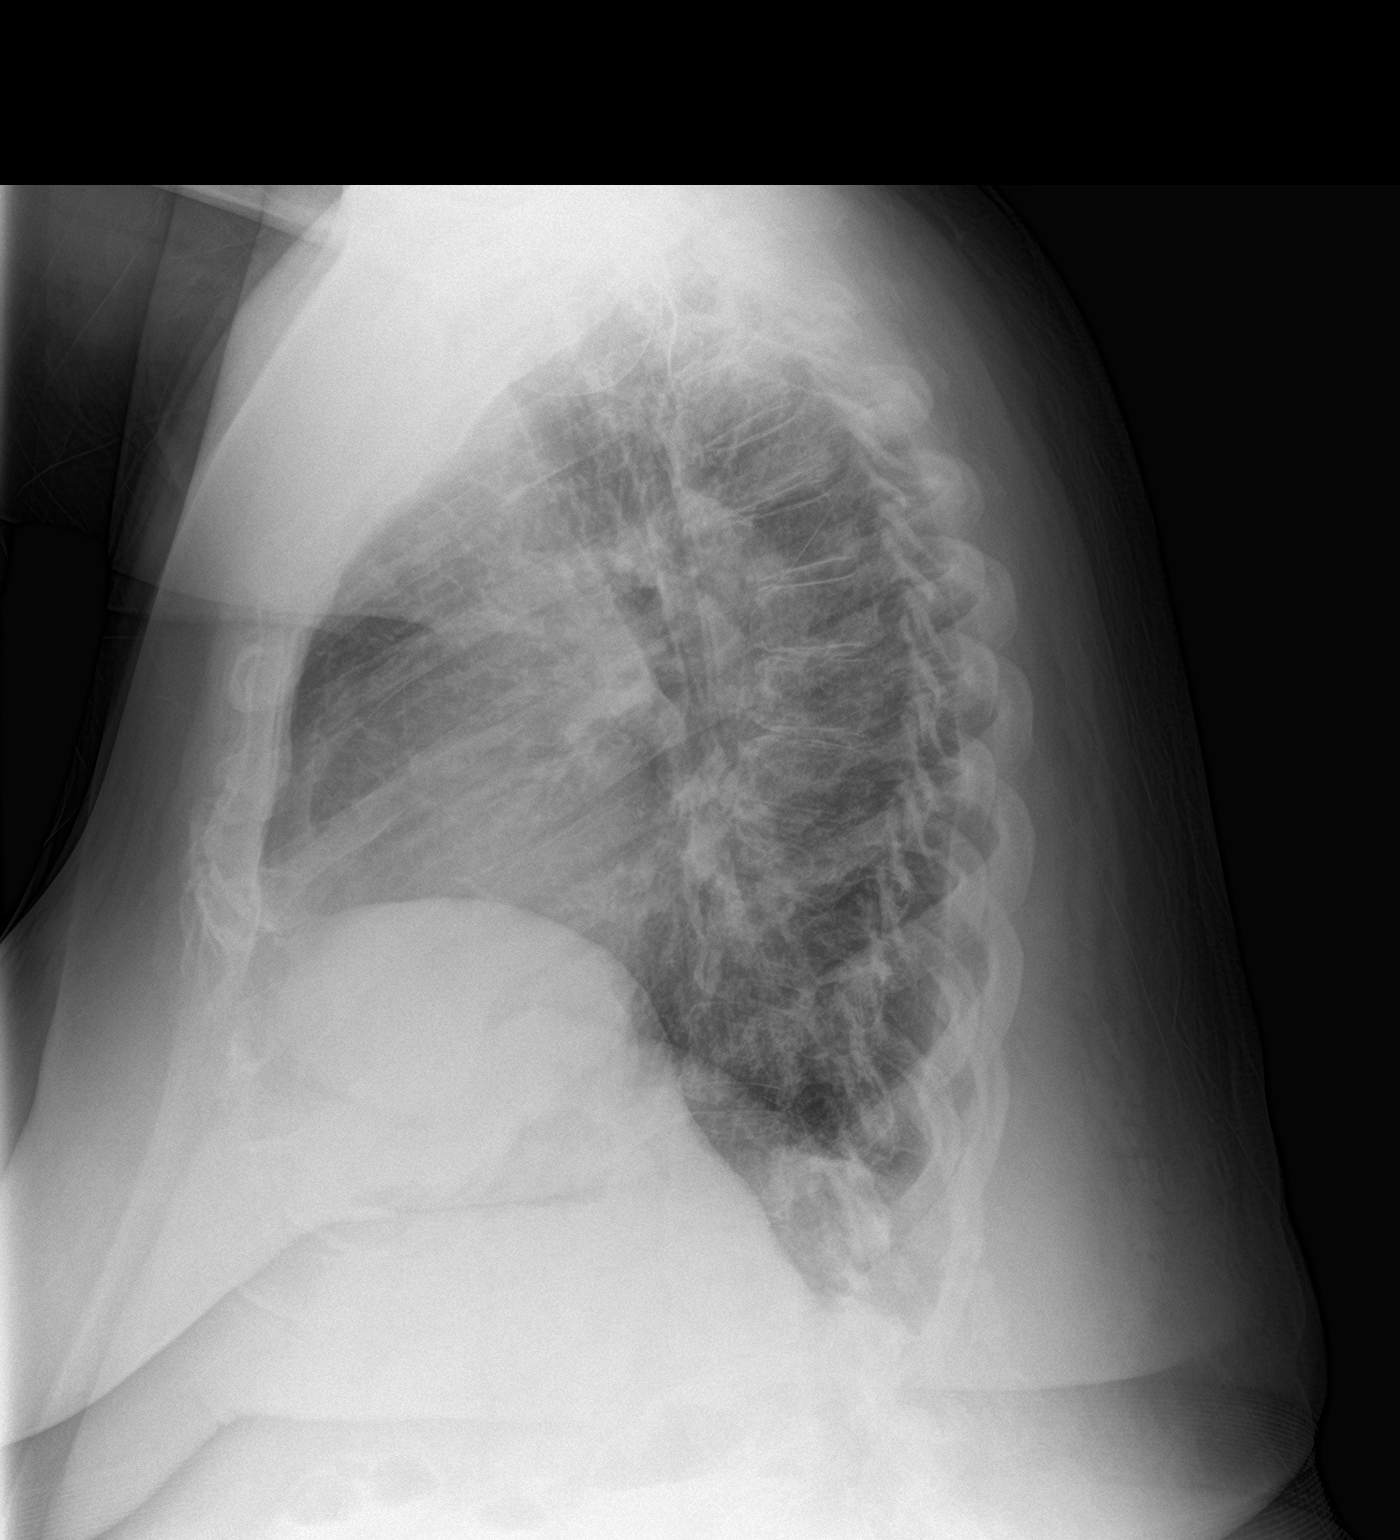

[2 of 2 positions shown; findings below may reference images not displayed]

FINDINGS: Normal mediastinum and cardiac silhouette. Normal pulmonary
vasculature. No evidence of effusion, infiltrate, or pneumothorax.
No acute bony abnormality.
IMPRESSION: No acute cardiopulmonary process.

## 2020-07-13 ENCOUNTER — Inpatient Hospital Stay (HOSPITAL_BASED_OUTPATIENT_CLINIC_OR_DEPARTMENT_OTHER): Payer: Medicare Other | Admitting: Internal Medicine

## 2020-07-13 ENCOUNTER — Encounter: Payer: Self-pay | Admitting: Internal Medicine

## 2020-07-13 ENCOUNTER — Inpatient Hospital Stay: Payer: Medicare Other | Attending: Internal Medicine

## 2020-07-13 ENCOUNTER — Other Ambulatory Visit: Payer: Self-pay

## 2020-07-13 DIAGNOSIS — Z803 Family history of malignant neoplasm of breast: Secondary | ICD-10-CM | POA: Diagnosis not present

## 2020-07-13 DIAGNOSIS — E538 Deficiency of other specified B group vitamins: Secondary | ICD-10-CM | POA: Insufficient documentation

## 2020-07-13 DIAGNOSIS — Z85040 Personal history of malignant carcinoid tumor of rectum: Secondary | ICD-10-CM | POA: Diagnosis not present

## 2020-07-13 DIAGNOSIS — D72819 Decreased white blood cell count, unspecified: Secondary | ICD-10-CM | POA: Diagnosis not present

## 2020-07-13 DIAGNOSIS — Z87891 Personal history of nicotine dependence: Secondary | ICD-10-CM | POA: Diagnosis not present

## 2020-07-13 DIAGNOSIS — D509 Iron deficiency anemia, unspecified: Secondary | ICD-10-CM | POA: Insufficient documentation

## 2020-07-13 DIAGNOSIS — E079 Disorder of thyroid, unspecified: Secondary | ICD-10-CM | POA: Insufficient documentation

## 2020-07-13 DIAGNOSIS — Z7982 Long term (current) use of aspirin: Secondary | ICD-10-CM | POA: Insufficient documentation

## 2020-07-13 DIAGNOSIS — Z79899 Other long term (current) drug therapy: Secondary | ICD-10-CM | POA: Insufficient documentation

## 2020-07-13 DIAGNOSIS — E119 Type 2 diabetes mellitus without complications: Secondary | ICD-10-CM | POA: Diagnosis not present

## 2020-07-13 DIAGNOSIS — D649 Anemia, unspecified: Secondary | ICD-10-CM | POA: Diagnosis not present

## 2020-07-13 DIAGNOSIS — M129 Arthropathy, unspecified: Secondary | ICD-10-CM | POA: Insufficient documentation

## 2020-07-13 DIAGNOSIS — I10 Essential (primary) hypertension: Secondary | ICD-10-CM | POA: Diagnosis not present

## 2020-07-13 LAB — BASIC METABOLIC PANEL
Anion gap: 9 (ref 5–15)
BUN: 42 mg/dL — ABNORMAL HIGH (ref 8–23)
CO2: 24 mmol/L (ref 22–32)
Calcium: 9.2 mg/dL (ref 8.9–10.3)
Chloride: 108 mmol/L (ref 98–111)
Creatinine, Ser: 1.28 mg/dL — ABNORMAL HIGH (ref 0.44–1.00)
GFR, Estimated: 44 mL/min — ABNORMAL LOW (ref >60)
Glucose, Bld: 225 mg/dL — ABNORMAL HIGH (ref 70–99)
Potassium: 4.3 mmol/L (ref 3.5–5.1)
Sodium: 141 mmol/L (ref 135–145)

## 2020-07-13 LAB — CBC WITH DIFFERENTIAL/PLATELET
Abs Immature Granulocytes: 0 10*3/uL (ref 0.00–0.07)
Basophils Absolute: 0 10*3/uL (ref 0.0–0.1)
Basophils Relative: 1 %
Eosinophils Absolute: 0.1 10*3/uL (ref 0.0–0.5)
Eosinophils Relative: 4 %
HCT: 32.6 % — ABNORMAL LOW (ref 36.0–46.0)
Hemoglobin: 10.2 g/dL — ABNORMAL LOW (ref 12.0–15.0)
Immature Granulocytes: 0 %
Lymphocytes Relative: 41 %
Lymphs Abs: 1.2 10*3/uL (ref 0.7–4.0)
MCH: 30.1 pg (ref 26.0–34.0)
MCHC: 31.3 g/dL (ref 30.0–36.0)
MCV: 96.2 fL (ref 80.0–100.0)
Monocytes Absolute: 0.4 10*3/uL (ref 0.1–1.0)
Monocytes Relative: 15 %
Neutro Abs: 1.1 10*3/uL — ABNORMAL LOW (ref 1.7–7.7)
Neutrophils Relative %: 39 %
Platelets: 156 10*3/uL (ref 150–400)
RBC: 3.39 MIL/uL — ABNORMAL LOW (ref 3.87–5.11)
RDW: 13.9 % (ref 11.5–15.5)
WBC: 2.9 10*3/uL — ABNORMAL LOW (ref 4.0–10.5)
nRBC: 0 % (ref 0.0–0.2)

## 2020-07-13 LAB — IRON AND TIBC
Iron: 59 ug/dL (ref 28–170)
Saturation Ratios: 16 % (ref 10.4–31.8)
TIBC: 370 ug/dL (ref 250–450)
UIBC: 311 ug/dL

## 2020-07-13 LAB — FERRITIN: Ferritin: 102 ng/mL (ref 11–307)

## 2020-07-13 NOTE — Assessment & Plan Note (Addendum)
#  Normocytic anemia unclear etiology; previous history of AVM iron deficiency; September 2020-iron studies negative for deficiency.  Bone marrow biopsy negative for any significant dyspoiesis.  Cytogenetics normal.  #Today hemoglobin is 10.4 overall stable.  Slightly low; otherwise fairly asymptomatic.Patient is on p.o. iron.  ng LDH.                            #White count 2.9/chronic benign ethnic neutropenia-neutrophil count 1.1-STABLE.   # Relative B12 deficiency-200; March 2021 -normal continue sublingual B12 tablets.   # DISPOSITION: # in 6 months-MD; labs- cbc/bmp/ iron studies/ferritin/ b12---Dr.B

## 2020-07-13 NOTE — Progress Notes (Signed)
[place] NOTE  Patient Care Team: [name], DO as PCP - General (Family Medicine) End, Harrell Gave, MD as PCP - Cardiology (Cardiology) Cammie Sickle, MD as Consulting Physician (Internal Medicine) Lorelee Cover., MD (Ophthalmology) End, Harrell Gave, MD as Consulting Physician (Cardiology) Vanita Ingles, RN as Case Manager ([place]) Cammie Sickle, MD as Consulting Physician (Internal Medicine)  CHIEF COMPLAINTS/PURPOSE OF CONSULTATION:   # AUG 2016- IRON DEFICIENCY ANEMIA-  Possible AVMs of colon [s/p Argon laser; Dr.Elliot;EGD-neg Nov 2016] s/p IV Ferriheme x2 [[id number]; ALLERGY]; Venofer IV [pre-meds]; worsening anemia not responding to iron- November 2020-bone marrow biopsy-erythroid hyperplasia but no dysplasia no blasts. NOV 2020- CT A/P-negative; CXR-NEG.  #Relative B12 deficiency-sublingual B12 [Jan 2021]  #December 2020 -elevated anti-CCP- [s/p evaluation Dr. Elder Negus  # MILD LEUCOPENIA/NEUTROPENIA [facility] 1.2-1.3]-chronic ethnic neutropenia/asymptomatic  # Rectal well diff carcinoid [1.2CM; s/p polypectomy 2011]; Allergy- [facility] Bouchard [Nov 2016]- neck/face swelling [improved with benadryl]; June 2021- TAVR for severe AS  HISTORY OF PRESENTING ILLNESS:  Diana Harmon 75 y.o.  female is here for follow-up for anemia of unclear etiology is here for follow-up.  Patient denies any new shortness of breath or cough.  Denies any blood in stools or black or stools.  No fever no chills.   Review of Systems  Constitutional: Negative for chills, diaphoresis, fever and weight loss.  HENT: Negative for nosebleeds and sore throat.   Eyes: Negative for double vision.  Respiratory: Negative for cough, hemoptysis, sputum production, shortness of breath and wheezing.   Cardiovascular: Negative for chest pain, palpitations, orthopnea and leg swelling.  Gastrointestinal: Negative for abdominal pain, blood in  stool, constipation, diarrhea, heartburn, melena, nausea and vomiting.  Genitourinary: Negative for dysuria, frequency and urgency.  Musculoskeletal: Positive for joint pain. Negative for back pain.  Skin: Negative.  Negative for itching and rash.  Neurological: Negative for dizziness, tingling, focal weakness, weakness and headaches.  Endo/Heme/Allergies: Does not bruise/bleed easily.  Psychiatric/Behavioral: Negative for depression. The patient is not nervous/anxious and does not have insomnia.      MEDICAL HISTORY:  Past Medical History:  Diagnosis Date   Anemia    Arthritis    legs, knees, back, wrists   Diabetes mellitus without complication (HCC)    Hypertension    Nausea    Patient is [name]    S/P TAVR (transcatheter aortic valve replacement) 11/09/2019   s/p TAVR with a 26 mm Edwards S3U via the TF approach by Dr. Angelena Form & Bartle   Severe aortic stenosis    SIRS (systemic inflammatory response syndrome) ([place]) 11/2019   Thyroid disease     SURGICAL HISTORY: Past Surgical History:  Procedure Laterality Date   CARDIAC CATHETERIZATION  10/19/2019   COLON SURGERY     caudarized polyps; rectal carcinoid excision ~ 2011   COLONOSCOPY     COLONOSCOPY WITH PROPOFOL N/A 03/13/2015   Procedure: COLONOSCOPY WITH PROPOFOL;  Surgeon: Manya Silvas, MD;  Location: [place];  Service: Endoscopy;  Laterality: N/A;   ESOPHAGOGASTRODUODENOSCOPY N/A 03/13/2015   Procedure: ESOPHAGOGASTRODUODENOSCOPY (EGD);  Surgeon: Manya Silvas, MD;  [facility] ENDOSCOPY;  Service: Endoscopy;  Laterality: N/A;   INTRAOPERATIVE TRANSTHORACIC ECHOCARDIOGRAM N/A 11/09/2019   Procedure: INTRAOPERATIVE TRANSTHORACIC ECHOCARDIOGRAM;  Surgeon: Burnell Blanks, MD;  Location: [place];  Service: Open Heart Surgery;  Laterality: N/A;   PARATHYROIDECTOMY  11/20/2004   right superior parathyroidecomy for primary hyperparathyroidism/parathyroid adenoma   RIGHT/LEFT  HEART CATH AND CORONARY ANGIOGRAPHY N/A 10/19/2019  Procedure: RIGHT/LEFT HEART CATH AND CORONARY ANGIOGRAPHY;  Surgeon: Nelva Bush, MD;  Location: [facility];  Service: Cardiovascular;  Laterality: N/A;   THYROIDECTOMY     TRANSCATHETER AORTIC VALVE REPLACEMENT, TRANSFEMORAL N/A 11/09/2019   Procedure: TRANSCATHETER AORTIC VALVE REPLACEMENT, TRANSFEMORAL;  Surgeon: Burnell Blanks, MD;  Location: [facility];  Service: Open Heart Surgery;  Laterality: N/A;    SOCIAL HISTORY: Social History   Socioeconomic History   Marital status: Divorced    Spouse name: Not on file   Number of children: 2   Years of education: 12   Highest education level: 12th grade  Occupational History   Occupation: retired-worked with nandicapped adults  Tobacco Use   Smoking status: Former Smoker    Years: 20.00    Types: Cigarettes    Quit date: 09/05/1979    Years since quitting: 40.8   Smokeless tobacco: Former Counsellor Use: Never used  Substance and Sexual Activity   Alcohol use: Yes    Alcohol/week: 0.0 - 1.0 standard drinks    Comment: occ. wine   Drug use: No   Sexual activity: Not on file  Other Topics Concern   Not on file  Social History Narrative   Not on file   Social Determinants of Health   Financial Resource Strain: Low Risk    Difficulty of Paying Living Expenses: Not hard at all  Food Insecurity: No Food Insecurity   Worried About Charity fundraiser in the Last Year: Never true   [place] in the Last Year: Never true  Transportation Needs: No Transportation Needs   Lack of Transportation (Medical): No   Lack of Transportation (Non-Medical): No  Physical Activity: Insufficiently Active   Days of Exercise per Week: 2 days   Minutes of Exercise per Session: 20 min  Stress: No Stress Concern Present   Feeling of Stress : Not at all  Social Connections: Moderately Integrated   Frequency of Communication with  Friends and Family: More than three times a week   Frequency of Social Gatherings with Friends and Family: Never   Attends Religious Services: More than 4 times per year   Active Member of [employer] or Organizations: Yes   Attends Music therapist: More than 4 times per year   Marital Status: Divorced  Human resources officer Violence: Not At Risk   Fear of Current or Ex-Partner: No   Emotionally Abused: No   Physically Abused: No   Sexually Abused: No    FAMILY HISTORY: Family History  Problem Relation Age of Onset   Parkinson's disease Mother    Heart disease Father    Lupus Sister    Aneurysm Son    Lupus Son    Breast cancer Maternal Aunt        mat great aunt    ALLERGIES:  is allergic to ferumoxytol, other, and penicillins.  MEDICATIONS:  Current Outpatient Medications  Medication Sig Dispense Refill   ACCU-CHEK GUIDE test strip Use to check blood sugar twice daily 200 each 12   Accu-Chek Softclix Lancets lancets Use to check blood sugar twice daily 200 each 12   acetaminophen (TYLENOL) 500 MG tablet Take 1,000 mg by mouth every 6 (six) hours as needed for moderate pain.      Artificial Tear Solution (GENTEAL TEARS) 0.1-0.2-0.3 % SOLN Place 1 drop into both eyes 3 (three) times daily as needed (dry/irritated eyes.).     aspirin 81 MG  chewable tablet Chew 1 tablet (81 mg total) by mouth daily.     B COMPLEX-C PO Take 10 mLs by mouth daily.     baclofen (LIORESAL) 10 MG tablet TAKE 1 TO 2 TABLETS BY MOUTH TWICE DAILY AS NEEDED FOR MUSCLE SPASM 60 tablet 2   Blood Glucose Monitoring Suppl (ACCU-CHEK GUIDE) w/Device KIT Use to check blood sugar twice daily 1 kit 0   Cholecalciferol (VITAMIN D3) 125 MCG (5000 UT) TABS Take 5,000 Units by mouth daily.     clopidogrel (PLAVIX) 75 MG tablet Take 1 tablet (75 mg total) by mouth daily with breakfast. 90 tablet 1   Ferrous Sulfate (IRON) 325 (65 Fe) MG TABS TAKE 1 TABLET BY MOUTH TWICE DAILY WITH A MEAL  180 each 1   furosemide (LASIX) 20 MG tablet Take 1 tablet (20 mg total) by mouth daily as needed for edema. 45 tablet 6   hydrochlorothiazide (HYDRODIURIL) 25 MG tablet Take 1 tablet (25 mg total) by mouth daily. 90 tablet 3   lactobacillus acidophilus (BACID) TABS tablet Take 2 tablets by mouth 2 (two) times daily as needed (digestive health.).      levocetirizine (XYZAL) 5 MG tablet TAKE 1 TABLET BY MOUTH ONCE DAILY IN THE EVENING 90 tablet 3   losartan (COZAAR) 50 MG tablet Take 1 tablet (50 mg total) by mouth daily. 90 tablet 3   mupirocin ointment (BACTROBAN) 2 % Apply 1 application topically 2 (two) times daily. 22 g 1   simvastatin (ZOCOR) 20 MG tablet TAKE 1 TABLET BY MOUTH ONCE DAILY AT  6  PM 90 tablet 3   TRULICITY 0.81 KG/8.1EH SOPN Inject 0.75 mg into the skin once a week. 2 mL 2   cephALEXin (KEFLEX) 500 MG capsule Take 4 tablets (2000 mg) by mouth 60 minutes prior to any dental procedures or cleanings (Patient not taking: Reported on 07/13/2020) 8 capsule 1   No current facility-administered medications for this visit.      Marland Kitchen  PHYSICAL EXAMINATION: ECOG PERFORMANCE STATUS: 1 - Symptomatic but completely ambulatory  Vitals:   07/13/20 1045  BP: (!) 145/62  Pulse: 78  Resp: 20  Temp: 97.6 F (36.4 C)   Filed Weights   07/13/20 1051  Weight: 223 lb (101.2 kg)    Physical Exam Constitutional:      Comments: Patient is obese.  In a wheelchair.  Accompanied by daughter.  HENT:     Head: Normocephalic and atraumatic.     Mouth/Throat:     Pharynx: No oropharyngeal exudate.  Eyes:     Pupils: Pupils are equal, round, and reactive to light.  Cardiovascular:     Rate and Rhythm: Normal rate and regular rhythm.  Pulmonary:     Effort: Pulmonary effort is normal. No respiratory distress.     Breath sounds: Normal breath sounds. No wheezing.  Abdominal:     General: Bowel sounds are normal. There is no distension.     Palpations: Abdomen is soft. There is  no mass.     Tenderness: There is no abdominal tenderness. There is no guarding or rebound.  Musculoskeletal:        General: No tenderness. Normal range of motion.     Cervical back: Normal range of motion and neck supple.  Skin:    General: Skin is warm.  Neurological:     Mental Status: She is alert and oriented to person, place, and time.  Psychiatric:        Mood  and Affect: Affect normal.     LABORATORY DATA:  I have reviewed the data as listed Lab Results  Component Value Date   WBC 2.9 (L) 07/13/2020   HGB 10.2 (L) 07/13/2020   HCT 32.6 (L) 07/13/2020   MCV 96.2 07/13/2020   PLT 156 07/13/2020   Recent Labs    11/10/19 2138 11/11/19 0609 11/12/19 0414 11/13/19 0354 01/14/20 0948 05/03/20 0935 07/13/20 1018  NA 135  --  136 139 138 140 141  K 4.0  --  4.3 4.5 4.3 4.2 4.3  CL 103  --  108 113* 104 102 108  CO2 20*  --  20* 18* '22 23 24  ' GLUCOSE 240*  --  202* 179* 214* 212* 225*  BUN 24*  --  21 19 36* 39* 42*  CREATININE 1.20*   < > 1.01* 0.90 1.19* 1.36* 1.28*  CALCIUM 9.3  --  8.4* 8.6* 9.2 9.7 9.2  GFRNONAA 45*   < > 55* >60 45* 39* 44*  GFRAA 52*   < > >60 >60 52* 45*  --   PROT 7.2  --  6.3*  --   --  7.3  --   ALBUMIN 3.1*  --  2.5*  --   --  3.9  --   AST 45*  --  34  --   --  41*  --   ALT 33  --  29  --   --  26  --   ALKPHOS 54  --  43  --   --  72  --   BILITOT 0.5  --  0.6  --   --  0.2  --    < > = values in this interval not displayed.    ASSESSMENT & PLAN:  Normocytic anemia #Normocytic anemia unclear etiology; previous history of AVM iron deficiency; September 2020-iron studies negative for deficiency.  Bone marrow biopsy negative for any significant dyspoiesis.  Cytogenetics normal.  #Today hemoglobin is 10.4 overall stable.  Slightly low; otherwise fairly asymptomatic.Patient is on p.o. iron.  ng LDH.                            #White count 2.9/chronic benign ethnic neutropenia-neutrophil count 1.1-STABLE.   # Relative B12  deficiency-200; March 2021 -normal continue sublingual B12 tablets.   # DISPOSITION: # in 6 months-MD; labs- cbc/bmp/ iron studies/ferritin/ b12---Dr.B       Cammie Sickle, MD 07/13/2020 12:21 PM

## 2020-07-17 ENCOUNTER — Telehealth: Payer: Self-pay | Admitting: General Practice

## 2020-07-17 ENCOUNTER — Ambulatory Visit (INDEPENDENT_AMBULATORY_CARE_PROVIDER_SITE_OTHER): Payer: Medicare Other | Admitting: General Practice

## 2020-07-17 DIAGNOSIS — M79641 Pain in right hand: Secondary | ICD-10-CM

## 2020-07-17 DIAGNOSIS — I5032 Chronic diastolic (congestive) heart failure: Secondary | ICD-10-CM | POA: Diagnosis not present

## 2020-07-17 DIAGNOSIS — M8949 Other hypertrophic osteoarthropathy, multiple sites: Secondary | ICD-10-CM

## 2020-07-17 DIAGNOSIS — E1169 Type 2 diabetes mellitus with other specified complication: Secondary | ICD-10-CM | POA: Diagnosis not present

## 2020-07-17 DIAGNOSIS — E785 Hyperlipidemia, unspecified: Secondary | ICD-10-CM | POA: Diagnosis not present

## 2020-07-17 DIAGNOSIS — M79642 Pain in left hand: Secondary | ICD-10-CM

## 2020-07-17 DIAGNOSIS — M170 Bilateral primary osteoarthritis of knee: Secondary | ICD-10-CM | POA: Diagnosis not present

## 2020-07-17 DIAGNOSIS — I10 Essential (primary) hypertension: Secondary | ICD-10-CM | POA: Diagnosis not present

## 2020-07-17 DIAGNOSIS — M159 Polyosteoarthritis, unspecified: Secondary | ICD-10-CM

## 2020-07-17 NOTE — Chronic Care Management (AMB) (Signed)
Chronic Care Management   CCM RN Visit Note  07/17/2020 Name: Diana Harmon MRN: [id number] DOB: 11/04/1945  Subjective: Diana Harmon is a 75 y.o. year old female who is a primary care patient of [name], DO. The care management team was consulted for assistance with disease management and care coordination needs.    Engaged with patient by telephone for follow up visit in response to provider referral for case management and/or care coordination services.   Consent to Services:  The patient was given information about Chronic Care Management services, agreed to services, and gave verbal consent prior to initiation of services.  Please see initial visit note for detailed documentation.   Patient agreed to services and verbal consent obtained.   Assessment: Review of patient past medical history, allergies, medications, health status, including review of consultants reports, laboratory and other test data, was performed as part of comprehensive evaluation and provision of chronic care management services.   SDOH (Social Determinants of Health) assessments and interventions performed:    CCM Care Plan  Allergies  Allergen Reactions   Ferumoxytol Swelling    Swelling in neck   Other     Other reaction(s): Other (See Comments) She refuses blood products-Jehovah witness   Penicillins Rash, Swelling and Hives    Outpatient Encounter Medications as of 07/17/2020  Medication Sig   ACCU-CHEK GUIDE test strip Use to check blood sugar twice daily   Accu-Chek Softclix Lancets lancets Use to check blood sugar twice daily   acetaminophen (TYLENOL) 500 MG tablet Take 1,000 mg by mouth every 6 (six) hours as needed for moderate pain.    Artificial Tear Solution (GENTEAL TEARS) 0.1-0.2-0.3 % SOLN Place 1 drop into both eyes 3 (three) times daily as needed (dry/irritated eyes.).   aspirin 81 MG chewable tablet Chew 1 tablet (81 mg total) by mouth  daily.   B COMPLEX-C PO Take 10 mLs by mouth daily.   baclofen (LIORESAL) 10 MG tablet TAKE 1 TO 2 TABLETS BY MOUTH TWICE DAILY AS NEEDED FOR MUSCLE SPASM   Blood Glucose Monitoring Suppl (ACCU-CHEK GUIDE) w/Device KIT Use to check blood sugar twice daily   cephALEXin (KEFLEX) 500 MG capsule Take 4 tablets (2000 mg) by mouth 60 minutes prior to any dental procedures or cleanings (Patient not taking: Reported on 07/13/2020)   Cholecalciferol (VITAMIN D3) 125 MCG (5000 UT) TABS Take 5,000 Units by mouth daily.   clopidogrel (PLAVIX) 75 MG tablet Take 1 tablet (75 mg total) by mouth daily with breakfast.   Ferrous Sulfate (IRON) 325 (65 Fe) MG TABS TAKE 1 TABLET BY MOUTH TWICE DAILY WITH A MEAL   furosemide (LASIX) 20 MG tablet Take 1 tablet (20 mg total) by mouth daily as needed for edema.   hydrochlorothiazide (HYDRODIURIL) 25 MG tablet Take 1 tablet (25 mg total) by mouth daily.   lactobacillus acidophilus (BACID) TABS tablet Take 2 tablets by mouth 2 (two) times daily as needed (digestive health.).    levocetirizine (XYZAL) 5 MG tablet TAKE 1 TABLET BY MOUTH ONCE DAILY IN THE EVENING   losartan (COZAAR) 50 MG tablet Take 1 tablet (50 mg total) by mouth daily.   mupirocin ointment (BACTROBAN) 2 % Apply 1 application topically 2 (two) times daily.   simvastatin (ZOCOR) 20 MG tablet TAKE 1 TABLET BY MOUTH ONCE DAILY AT  6  PM   TRULICITY 4.85 IO/2.7OJ SOPN Inject 0.75 mg into the skin once a week.   [DISCONTINUED] Cromolyn Sodium (NASAL ALLERGY  NA) Place 1 spray into the nose daily as needed (allergies).   No facility-administered encounter medications on file as of 07/17/2020.    Patient Active Problem List   Diagnosis Date Noted   S/P TAVR (transcatheter aortic valve replacement) 11/09/2019   Patient is [name]    Severe aortic stenosis    Thyroid disease    Chronic heart failure with preserved ejection fraction (HFpEF) ([place]) 09/30/2019   Normocytic anemia  02/17/2019   Morbid obesity ([place]) 11/05/2017   Neutropenia ([place]) 04/24/2017   Vitamin D deficiency 10/08/2016   Meniere's disease of right ear 07/04/2016   TIA (transient ischemic attack) 07/04/2016   Nonrheumatic aortic valve stenosis 07/04/2016   Seasonal allergies 01/30/2016   Recurrent UTI 10/26/2015   Iron deficiency anemia due to chronic blood loss 02/28/2015   History of adenomatous polyp of colon 02/13/2015   Osteoarthritis of both knees 12/05/2014   Acid reflux 12/05/2014   Benign neoplasm of colon 12/05/2014   Type 2 diabetes mellitus with other specified complication ([place]) 71/69/6789   Essential hypertension 12/05/2014   Hyperlipidemia associated with type 2 diabetes mellitus ([place]) 12/05/2014   Arthritis, degenerative 12/05/2014   Anemia due to multiple mechanisms 12/05/2014   Abnormal liver enzymes 12/05/2014    Conditions to be addressed/monitored:CHF, HTN, HLD, DMII and Chronic pain and discomfort: back, knees, hands  Care Plan : RNCM: Heart Failure (Adult)  Updates made by Vanita Ingles since 07/17/2020 12:00 AM    Problem: RNCM: Management of HF   Priority: Medium    Long-Range Goal: RNCM: HF Symptom Exacerbation Prevented or Minimized   Priority: Medium  Note:   Current Barriers:   Knowledge deficits related to basic heart failure pathophysiology and self care management  Unable to independently manage HF  Does not contact provider office for questions/concerns  Lack of scale in home- weighs every other day  Financial strain  Nurse Case Manager Clinical Goal(s):   Over the next 120 days, patient will weigh self daily and record  Over the next 120 days, patient will verbalize understanding of Heart Failure Action Plan and when to call doctor  Over the next 120 days, patient will take all Heart Failure mediations as prescribed Interventions:   Collaboration with [name], DO regarding development and update of  comprehensive plan of care as evidenced by provider attestation and co-signature  Inter-disciplinary care team collaboration (see longitudinal plan of care)  Basic overview and discussion of pathophysiology of Heart Failure  Provided written and verbal education on low sodium diet  Reviewed Heart Failure Action Plan in depth and provided written copy  Assessed for scales in home  Discussed importance of daily weight  Reviewed role of diuretics in prevention of fluid overload. 07-17-2020: The patient is compliant with Lasix. Denies issue with edema noted in feet or legs. Is having some swelling in her hands but likely from OA pain  Patient Goals/Self-Care Activities  Over the next 120 days, patient will:  - Take Heart Failure Medications as prescribed - Weigh daily and record (notify MD with 3 lb weight gain over night or 5 lb in a week) - Follow CHF Action Plan - Adhere to low sodium diet - barriers to lifestyle changes reviewed and addressed - barriers to treatment reviewed and addressed - self-awareness of signs/symptoms of worsening disease encouraged Follow Up Plan: Telephone follow up appointment with care management team member scheduled for: 09-25-2020 at 130 pm   Care Plan : RNCM: Hypertension (Adult)  Updates made by Vanita Ingles since 07/17/2020 12:00 AM    Problem: RNCM: Hypertension (Hypertension)   Priority: Medium    Goal: RNCM: Hypertension Monitored   Note:   Objective:   Last practice recorded BP readings:   BP Readings from Last 3 Encounters:   07/13/20  (!) 145/62   05/03/20  (!) 124/50   03/10/20  (!) 125/47      Most recent eGFR/CrCl: No results found for: EGFR  No components found for: CRCL Current Barriers:   Knowledge Deficits related to basic understanding of hypertension pathophysiology and self care management  Knowledge Deficits related to understanding of medications prescribed for management of hypertension  Limited Social  Support  Unable to independently manage HTN  Lacks social connections  Does not contact provider office for questions/concerns Case Manager Clinical Goal(s):   Over the next 120 days, patient will verbalize understanding of plan for hypertension management  Over the next 120 days, patient will demonstrate improved adherence to prescribed treatment plan for hypertension as evidenced by taking all medications as prescribed, monitoring and recording blood pressure as directed, adhering to low sodium/DASH diet  Over the next 120 days, patient will demonstrate improved health management independence as evidenced by checking blood pressure as directed and notifying PCP if SBP>160 or DBP > 90, taking all medications as prescribe, and adhering to a low sodium diet as discussed. Interventions:   Collaboration with [name], DO regarding development and update of comprehensive plan of care as evidenced by provider attestation and co-signature  Inter-disciplinary care team collaboration (see longitudinal plan of care)  Evaluation of current treatment plan related to hypertension self management and patient's adherence to plan as established by provider.  Provided education to patient re: stroke prevention, s/s of heart attack and stroke, DASH diet, complications of uncontrolled blood pressure  Evaluation of blood pressure readings. The patient states her blood pressure is doing well. Last reading was 145/62, pulse 78 at her oncology appointment last week. States this is a little high for her. States at home today is was 123/65.   Reviewed medications with patient and discussed importance of compliance. 07-17-2020: The patient is compliant with medications regimen. The patient denies issues with medications   Discussed plans with patient for ongoing care management follow up and provided patient with direct contact information for care management team  Advised patient, providing  education and rationale, to monitor blood pressure daily and record, calling PCP for findings outside established parameters.  Patient Goals/Self-Care Activities  Over the next 120 days, patient will:  - - blood pressure trends reviewed - depression screen reviewed - home or ambulatory blood pressure monitoring encouraged  Follow Up Plan: Telephone follow up appointment with care management team member scheduled for: 09-25-2020 at 230  pm   Care Plan : RNCM: Diabetes Type 2 (Adult)  Updates made by Vanita Ingles since 07/17/2020 12:00 AM    Problem: RNCM: Glycemic Management (Diabetes, Type 2)   Priority: Medium    Long-Range Goal: RNCM: Glycemic Management Optimized   Priority: Medium  Note:   Objective:  Lab Results  Component Value Date   HGBA1C 8.1 (A) 03/10/2020    Lab Results  Component Value Date   CREATININE 1.36 (H) 05/03/2020   CREATININE 1.19 (H) 01/14/2020   CREATININE 0.90 11/13/2019     No results found for: EGFR Current Barriers:   Knowledge Deficits related to basic Diabetes pathophysiology and self care/management  Knowledge Deficits related to medications  used for management of diabetes  Limited Social Support  Unable to independently manage DM  Lacks social connections  Does not contact provider office for questions/concerns Case Manager Clinical Goal(s):   Collaboration with [name], DO regarding development and update of comprehensive plan of care as evidenced by provider attestation and co-signature  Inter-disciplinary care team collaboration (see longitudinal plan of care)  Over the next 120 days, patient will demonstrate improved adherence to prescribed treatment plan for diabetes self care/management as evidenced by:   daily monitoring and recording of CBG   adherence to ADA/ carb modified diet  adherence to prescribed medication regimen Interventions:   Provided education to patient about basic DM disease  process  Reviewed medications with patient and discussed importance of medication adherence. 07-17-2020: Is compliant with medications, denies any issues with cost constraints   Discussed plans with patient for ongoing care management follow up and provided patient with direct contact information for care management team  Provided patient with written educational materials related to hypo and hyperglycemia and importance of correct treatment. 07-17-2020: Denies any low blood sugars. Did not have her log with her but states it has been doing good. She is eating well and monitoring dietary intake.   Review of patient status, including review of consultants reports, relevant laboratory and other test results, and medications completed. Patient Goals/Self-Care Activities  Over the next 120 days, patient will:  - Self administers oral medications as prescribed Attends all scheduled provider appointments Checks blood sugars as prescribed and utilize hyper and hypoglycemia protocol as needed Adheres to prescribed ADA/carb modified - barriers to adherence to treatment plan identified - blood glucose readings reviewed- range is 130 to 141 - mutual A1C goal set or reviewed - resources required to improve adherence to care identified - self-awareness of signs/symptoms of hypo or hyperglycemia encouraged - use of blood glucose monitoring log promoted Follow Up Plan: Telephone follow up appointment with care management team member scheduled for: 09-25-2020 at 230  pm   Care Plan : RNCM: HLD managment  Updates made by Vanita Ingles since 07/17/2020 12:00 AM    Problem: RNCM: HLD management   Priority: Medium    Goal: RNCM: HLD Health Literacy Improved   Priority: Medium  Note:   Current Barriers:   Poorly controlled hyperlipidemia, complicated by HTN, DM  Current antihyperlipidemic regimen: Zocor 79m QD  Most recent lipid panel:     Component Value Date/Time   CHOL 170 05/03/2020 0935    TRIG 103 05/03/2020 0935   HDL 54 05/03/2020 0935   CHOLHDL 3.1 05/03/2020 0935   CHOLHDL 2.4 07/21/2017 0000   VLDL 26 06/03/2016 0001   LDLCALC 97 05/03/2020 0935   LDLCALC 77 07/21/2017 0000     ASCVD risk enhancing conditions: age >>33 DM, HTN, CHF  Unable to independently manage HLD   Lacks social connections  Does not contact provider office for questions/concerns  RN Care Manager Clinical Goal(s):   Over the next 120 days, patient will work with RConsulting civil engineer providers, and care team towards execution of optimized self-health management plan  Over the next 120 days, patient will verbalize understanding of plan for effective management of HLD  Over the next 120 days, patient will work with RNCM and pcp to address needs related to compliance, heart healthy diet, and working with the CCM team for optimal health and well being.   Interventions:  Collaboration with KOlin Hauser DO regarding development and update of comprehensive  plan of care as evidenced by provider attestation and co-signature  Inter-disciplinary care team collaboration (see longitudinal plan of care)  Medication review performed; medication list updated in electronic medical record.   Inter-disciplinary care team collaboration (see longitudinal plan of care)  Referred to pharmacy team for assistance with HLD medication management  Evaluation of current treatment plan related to HLD and patient's adherence to plan as established by provider. 07-17-2020: Denies any issues related to HLD. States compliance with heart healthy/ADA diet   Advised patient to call the office for changes or questions   Patient Goals/Self-Care Activities:  Over the next 120 days, patient will:   - call for medicine refill 2 or 3 days before it runs out - call if I am sick and can't take my medicine - keep a list of all the medicines I take; vitamins and herbals too - learn to read medicine labels - use a  pillbox to sort medicine - drink 6 to 8 glasses of water each day - eat 5 or 6 small meals each day - fill half the plate with nonstarchy vegetables - limit fast food meals to no more than 1 per week - manage portion size - read food labels for fat, fiber, carbohydrates and portion size - be open to making changes - I can manage, know and watch for signs of a heart attack - if I have chest pain, call for help - learn about small changes that will make a big difference - learn my personal risk factors  - patient's preferred learning methods utilized - questions encouraged - readiness to learn monitored  Follow Up Plan: Telephone follow up appointment with care management team member scheduled for: 09-25-2020 at 230 pm     Care Plan : RNCM: Osteoarthritis (Adult)  Updates made by Vanita Ingles since 07/17/2020 12:00 AM    Problem: RNCM: Pain (Osteoarthritis) lower back, knees, hands and sometimes feet   Priority: High    Long-Range Goal: RNCM: Manage Pain   Priority: High  Note:   Current Barriers:   Knowledge Deficits related to managing acute/chronic pain  Non-adherence to scheduled provider appointments  Non-adherence to prescribed medication regimen  Difficulty obtaining medications  Chronic Disease Management support and education needs related to chronic pain  Unable to independently manage new onset of pain in bilateral hands   Does not contact provider office for questions/concerns Nurse Case Manager Clinical Goal(s):   patient will verbalize understanding of plan for managing pain  patient will attend all scheduled medical appointments: No upcoming appointments; explained to the patient the pcp may want to see the patient for new complaint of pain in her hands  patient will demonstrate use of different relaxation  skills and/or diversional activities to assist with pain reduction (distraction, imagery, relaxation, massage, acupressure, TENS, heat, and cold  application  patient will report pain at a level less than 3 to 4 on a 10-10 rating scale  patient will use pharmacological and nonpharmacological pain relief strategies  patient will verbalize acceptable level of pain relief and ability to engage in desired activities  patient will engage in desired activities without an increase in pain level Interventions:   Collaboration with Parks Ranger, Devonne Doughty, DO regarding development and update of comprehensive plan of care as evidenced by provider attestation and co-signature  Inter-disciplinary care team collaboration (see longitudinal plan of care)  - careful application of heat or ice encouraged  - deep breathing, relaxation and mindfulness use promoted  - effectiveness  of pharmacologic therapy monitored  - medication-induced side effects managed  - misuse of pain medication assessed  - motivation and barriers to change assessed and addressed  - mutually acceptable comfort goal set  - pain assessed  - pain treatment goals reviewed  - premedication prior to activity encouraged  Evaluation of current treatment plan related to pain and discomfort  and patient's adherence to plan as established by provider.  Advised patient to call the office for changes in level or intensity of pain and discomfort  Provided education to patient re: alternate pain relief measures. Review of neuropathy pain and if the patient has had any injuries to the area. Several years ago the patient had surgery on her left hand/wrist for carpal tunnel syndrome.   Reviewed medications with patient and discussed compliance. The patient takes extra strength Tylenol for pain relief and also use cream. She also uses heat at times.   Collaborated with pcp regarding new concern of patients hands feeling like they are "drawing".  The patient states that her left hand is worse than her right. She says she is putting creams on it but that does not help. Ask for  recommendations from the pcp. Advised she may need to come in for evaluation by pcp.   Discussed plans with patient for ongoing care management follow up and provided patient with direct contact information for care management team  Allow patient to maintain a diary of pain ratings, timing, precipitating events, medications, treatments, and what works best to relieve pain,   Refer to support groups and self-help groups  Educate patient about the use of pharmacological interventions for pain management- antianxiety, antidepressants, NSAIDS, opioid analgesics,   Explain the importance of lifestyle modifications to effective pain management  Patient Goals/Self Care Activities:   - healthy lifestyle promoted  - medication side effects managed  - multimodal pain management plan developed  - pain assessed  - response to pain management plan monitored  - response to pharmacologic therapy monitored  - support and encouragement provided  Self-administers medications as prescribed  Attends all scheduled provider appointments  Calls pharmacy for medication refills  Calls provider office for new concerns or questions Follow Up Plan: Telephone follow up appointment with care management team member scheduled for: 09-25-2020 at 230 pm     Task: RNCM: Pain management   Note:   Care Management Activities:    - healthy lifestyle promoted - medication side effects managed - multimodal pain management plan developed - pain assessed - response to pain management plan monitored - response to pharmacologic therapy monitored - support and encouragement provided         Plan:Telephone follow up appointment with care management team member scheduled for:  09-25-2020 at 230 pm  [place], MSN, [place] Mobile: [phone]

## 2020-07-17 NOTE — Patient Instructions (Signed)
Visit Information  PATIENT GOALS: Goals Addressed            This Visit's Progress   . RNCM: Manage Pain-Osteoarthritis       Timeframe:  Short-Term Goal Priority:  High Start Date:    07-17-2020                         Expected End Date:    10-02-2020                   Follow Up Date 09-25-2020   - call for medicine refill 2 or 3 days before it runs out - develop a personal pain management plan - keep track of prescription refills - plan exercise or activity when pain is best controlled - prioritize tasks for the day - start a pain diary - stay active - track times pain is worst and when it is best - track what makes the pain worse and what makes it better - use ice or heat for pain relief - work slower and less intense when having pain    Why is this important?    Day-to-day life can be hard when you have joint pain.   Pain medicine is just one piece of the treatment puzzle. There are many things you can do to manage pain and stay strong.    Lifestyle changes like stopping smoking and eating foods with Vitamin D and calcium keeps your bones and muscles healthy. Your joints are better when supported by strong muscles.   You can try these action steps to help you manage your pain.     Notes: Pain in back and knees and now in hands and sometimes feet. States: "My hands feel like they are drawing"      Patient Care Plan: RNCM: Heart Failure (Adult)    Problem Identified: RNCM: Management of HF   Priority: Medium    Long-Range Goal: RNCM: HF Symptom Exacerbation Prevented or Minimized   Priority: Medium  Note:   Current Barriers:  Marland Kitchen Knowledge deficits related to basic heart failure pathophysiology and self care management . Unable to independently manage HF . Does not contact provider office for questions/concerns . Lack of scale in home- weighs every other day . Financial strain  Occupational hygienist):   Over the next 120 days, patient will weigh  self daily and record  Over the next 120 days, patient will verbalize understanding of Heart Failure Action Plan and when to call doctor  Over the next 120 days, patient will take all Heart Failure mediations as prescribed Interventions:  . Collaboration with [name], DO regarding development and update of comprehensive plan of care as evidenced by provider attestation and co-signature . Inter-disciplinary care team collaboration (see longitudinal plan of care) . Basic overview and discussion of pathophysiology of Heart Failure . Provided written and verbal education on low sodium diet . Reviewed Heart Failure Action Plan in depth and provided written copy . Assessed for scales in home . Discussed importance of daily weight . Reviewed role of diuretics in prevention of fluid overload. 07-17-2020: The patient is compliant with Lasix. Denies issue with edema noted in feet or legs. Is having some swelling in her hands but likely from OA pain  Patient Goals/Self-Care Activities . Over the next 120 days, patient will:  - Take Heart Failure Medications as prescribed - Weigh daily and record (notify MD with 3 lb weight gain over  night or 5 lb in a week) - Follow CHF Action Plan - Adhere to low sodium diet - barriers to lifestyle changes reviewed and addressed - barriers to treatment reviewed and addressed - self-awareness of signs/symptoms of worsening disease encouraged Follow Up Plan: Telephone follow up appointment with care management team member scheduled for: 09-25-2020 at 130 pm   Task: RNCM: Identify and Minimize Risk of Heart Failure Exacerbation   Note:   Care Management Activities:    - barriers to lifestyle changes reviewed and addressed - barriers to treatment reviewed and addressed - self-awareness of signs/symptoms of worsening disease encouraged       Patient Care Plan: RNCM: Hypertension (Adult)    Problem Identified: RNCM: Hypertension (Hypertension)    Priority: Medium    Goal: RNCM: Hypertension Monitored   Note:   Objective:  . Last practice recorded BP readings:  . BP Readings from Last 3 Encounters: .  07/13/20 . (!) 145/62 .  05/03/20 . (!) 124/50 .  03/10/20 . (!) 125/47 .    Marland Kitchen Most recent eGFR/CrCl: No results found for: EGFR  No components found for: CRCL Current Barriers:  Marland Kitchen Knowledge Deficits related to basic understanding of hypertension pathophysiology and self care management . Knowledge Deficits related to understanding of medications prescribed for management of hypertension . Limited Social Support . Unable to independently manage HTN . Lacks social connections . Does not contact provider office for questions/concerns Case Manager Clinical Goal(s):  Marland Kitchen Over the next 120 days, patient will verbalize understanding of plan for hypertension management . Over the next 120 days, patient will demonstrate improved adherence to prescribed treatment plan for hypertension as evidenced by taking all medications as prescribed, monitoring and recording blood pressure as directed, adhering to low sodium/DASH diet . Over the next 120 days, patient will demonstrate improved health management independence as evidenced by checking blood pressure as directed and notifying PCP if SBP>160 or DBP > 90, taking all medications as prescribe, and adhering to a low sodium diet as discussed. Interventions:  . Collaboration with [name], DO regarding development and update of comprehensive plan of care as evidenced by provider attestation and co-signature . Inter-disciplinary care team collaboration (see longitudinal plan of care) . Evaluation of current treatment plan related to hypertension self management and patient's adherence to plan as established by provider. . Provided education to patient re: stroke prevention, s/s of heart attack and stroke, DASH diet, complications of uncontrolled blood pressure . Evaluation of blood  pressure readings. The patient states her blood pressure is doing well. Last reading was 145/62, pulse 78 at her oncology appointment last week. States this is a little high for her. States at home today is was 123/65.  Marland Kitchen Reviewed medications with patient and discussed importance of compliance. 07-17-2020: The patient is compliant with medications regimen. The patient denies issues with medications  . Discussed plans with patient for ongoing care management follow up and provided patient with direct contact information for care management team . Advised patient, providing education and rationale, to monitor blood pressure daily and record, calling PCP for findings outside established parameters.  Patient Goals/Self-Care Activities . Over the next 120 days, patient will:  - - blood pressure trends reviewed - depression screen reviewed - home or ambulatory blood pressure monitoring encouraged  Follow Up Plan: Telephone follow up appointment with care management team member scheduled for: 09-25-2020 at 230  pm   Task: RNCM: Identify and Monitor Blood Pressure Elevation   Note:  Care Management Activities:    - blood pressure trends reviewed - depression screen reviewed - home or ambulatory blood pressure monitoring encouraged       Patient Care Plan: RNCM: Diabetes Type 2 (Adult)    Problem Identified: RNCM: Glycemic Management (Diabetes, Type 2)   Priority: Medium    Long-Range Goal: RNCM: Glycemic Management Optimized   Priority: Medium  Note:   Objective:  Lab Results  Component Value Date   HGBA1C 8.1 (A) 03/10/2020 .   Lab Results  Component Value Date   CREATININE 1.36 (H) 05/03/2020   CREATININE 1.19 (H) 01/14/2020   CREATININE 0.90 11/13/2019 .   Marland Kitchen No results found for: EGFR Current Barriers:  Marland Kitchen Knowledge Deficits related to basic Diabetes pathophysiology and self care/management . Knowledge Deficits related to medications used for management of diabetes . Limited  Social Support . Unable to independently manage DM . Lacks social connections . Does not contact provider office for questions/concerns Case Manager Clinical Goal(s):  Marland Kitchen Collaboration with [name], DO regarding development and update of comprehensive plan of care as evidenced by provider attestation and co-signature . Inter-disciplinary care team collaboration (see longitudinal plan of care) . Over the next 120 days, patient will demonstrate improved adherence to prescribed treatment plan for diabetes self care/management as evidenced by:  . daily monitoring and recording of CBG  . adherence to ADA/ carb modified diet . adherence to prescribed medication regimen Interventions:  . Provided education to patient about basic DM disease process . Reviewed medications with patient and discussed importance of medication adherence. 07-17-2020: Is compliant with medications, denies any issues with cost constraints  . Discussed plans with patient for ongoing care management follow up and provided patient with direct contact information for care management team . Provided patient with written educational materials related to hypo and hyperglycemia and importance of correct treatment. 07-17-2020: Denies any low blood sugars. Did not have her log with her but states it has been doing good. She is eating well and monitoring dietary intake.  . Review of patient status, including review of consultants reports, relevant laboratory and other test results, and medications completed. Patient Goals/Self-Care Activities . Over the next 120 days, patient will:  - Self administers oral medications as prescribed Attends all scheduled provider appointments Checks blood sugars as prescribed and utilize hyper and hypoglycemia protocol as needed Adheres to prescribed ADA/carb modified - barriers to adherence to treatment plan identified - blood glucose readings reviewed- range is 130 to 141 - mutual A1C  goal set or reviewed - resources required to improve adherence to care identified - self-awareness of signs/symptoms of hypo or hyperglycemia encouraged - use of blood glucose monitoring log promoted Follow Up Plan: Telephone follow up appointment with care management team member scheduled for: 09-25-2020 at 230  pm   Task: RNCM: Alleviate Barriers to Glycemic Management   Note:   Care Management Activities:    - barriers to adherence to treatment plan identified - blood glucose readings reviewed - mutual A1C goal set or reviewed - resources required to improve adherence to care identified - self-awareness of signs/symptoms of hypo or hyperglycemia encouraged - use of blood glucose monitoring log promoted       Patient Care Plan: RNCM: HLD managment    Problem Identified: RNCM: HLD management   Priority: Medium    Goal: RNCM: HLD Health Literacy Improved   Priority: Medium  Note:   Current Barriers:  . Poorly controlled hyperlipidemia, complicated by HTN,  DM . Current antihyperlipidemic regimen: Zocor 44m QD . Most recent lipid panel:     Component Value Date/Time   CHOL 170 05/03/2020 0935   TRIG 103 05/03/2020 0935   HDL 54 05/03/2020 0935   CHOLHDL 3.1 05/03/2020 0935   CHOLHDL 2.4 07/21/2017 0000   VLDL 26 06/03/2016 0001   LDLCALC 97 05/03/2020 0935   LDLCALC 77 07/21/2017 0000 .   .Marland KitchenASCVD risk enhancing conditions: age >>21 DM, HTN, CHF . Unable to independently manage HLD  . Lacks social connections . Does not contact provider office for questions/concerns  RN Care Manager Clinical Goal(s):  .Marland KitchenOver the next 120 days, patient will work with RConsulting civil engineer providers, and care team towards execution of optimized self-health management plan . Over the next 120 days, patient will verbalize understanding of plan for effective management of HLD . Over the next 120 days, [facility] needs related to compliance, heart healthy diet,  and working with the CCM team for optimal health and well being.   Interventions: . Collaboration with KOlin Hauser DO regarding development and update of comprehensive plan of care as evidenced by provider attestation and co-signature . Inter-disciplinary care team collaboration (see longitudinal plan of care) . Medication review performed; medication list updated in electronic medical record.  .[facility] team collaboration (see longitudinal plan of care) . Referred to pharmacy team for assistance with HLD medication management . Evaluation of current treatment plan related to HLD and patient's adherence to plan as established by provider. 07-17-2020: Denies any issues related to HLD. States compliance with heart healthy/ADA diet  . Advised patient to call the office for changes or questions   Patient Goals/Self-Care Activities: . Over the next 120 days, patient will:   - call for medicine refill 2 or 3 days before it runs out - call if I am sick and can't take my medicine - keep a list of all the medicines I take; vitamins and herbals too - learn to read medicine labels - use a pillbox to sort medicine - drink 6 to 8 glasses of water each day - eat 5 or 6 small meals each day - fill half the plate with nonstarchy vegetables - limit fast food meals to no more than 1 per week - manage portion size - read food labels for fat, fiber, carbohydrates and portion size - be open to making changes - I can manage, know and watch for signs of a heart attack - if I have chest pain, call for help - learn about small changes that will make a big difference - learn my personal risk factors  - patient's preferred learning methods utilized - questions encouraged - readiness to learn monitored  Follow Up Plan: Telephone follow up appointment with care management team member scheduled for: 09-25-2020 at 230 pm     Task: RNCM: Identify Deficit and Optimize Health Literacy    Note:   Care Management Activities:    - patient's preferred learning methods utilized - questions encouraged - readiness to learn monitored       Patient Care Plan: RNCM: Osteoarthritis (Adult)    Problem Identified: RNCM: Pain (Osteoarthritis) lower back, knees, hands and sometimes feet   Priority: High    Long-Range Goal: RNCM: Manage Pain   Priority: High  Note:   Current Barriers:  .Marland KitchenKnowledge Deficits related to managing acute/chronic pain . Non-adherence to scheduled provider appointments . Non-adherence to prescribed  medication regimen . Difficulty obtaining medications . Chronic Disease Management support and education needs related to chronic pain . Unable to independently manage new onset of pain in bilateral hands  . Does not contact provider office for questions/concerns Nurse Case Manager Clinical Goal(s):  . patient will verbalize understanding of plan for managing pain . patient will attend all scheduled medical appointments: No upcoming appointments; explained to the patient the pcp may want to see the patient for new complaint of pain in her hands . patient will demonstrate use of different relaxation  skills and/or diversional activities to assist with pain reduction (distraction, imagery, relaxation, massage, acupressure, TENS, heat, and cold application . patient will report pain at a level less than 3 to 4 on a 10-10 rating scale . patient will use pharmacological and nonpharmacological pain relief strategies . patient will verbalize acceptable level of pain relief and ability to engage in desired activities . patient will engage in desired activities without an increase in pain level Interventions:  . Collaboration with [name], DO regarding development and update of comprehensive plan of care as evidenced by provider attestation and co-signature . Inter-disciplinary care team collaboration (see longitudinal plan of care) . - careful  application of heat or ice encouraged . - deep breathing, relaxation and mindfulness use promoted . - effectiveness of pharmacologic therapy monitored . - medication-induced side effects managed . - misuse of pain medication assessed . - motivation and barriers to change assessed and addressed . - mutually acceptable comfort goal set . - pain assessed . - pain treatment goals reviewed . - premedication prior to activity encouraged . Evaluation of current treatment plan related to pain and discomfort  and patient's adherence to plan as established by provider. . Advised patient to call the office for changes in level or intensity of pain and discomfort . Provided education to patient re: alternate pain relief measures. Review of neuropathy pain and if the patient has had any injuries to the area. Several years ago the patient had surgery on her left hand/wrist for carpal tunnel syndrome.  . Reviewed medications with patient and discussed compliance. The patient takes extra strength Tylenol for pain relief and also use cream. She also uses heat at times.  Marland Kitchen Collaborated with pcp regarding new concern of patients hands feeling like they are "drawing".  The patient states that her left hand is worse than her right. She says she is putting creams on it but that does not help. Ask for recommendations from the pcp. Advised she may need to come in for evaluation by pcp.  . Discussed plans with patient for ongoing care management follow up and provided patient with direct contact information for care management team . Allow patient to maintain a diary of pain ratings, timing, precipitating events, medications, treatments, and what works best to relieve pain,  . Refer to support groups and self-help groups . Educate patient about the use of pharmacological interventions for pain management- antianxiety, antidepressants, NSAIDS, opioid analgesics,  . Explain the importance of lifestyle modifications to  effective pain management  Patient Goals/Self Care Activities:  . - healthy lifestyle promoted . - medication side effects managed . - multimodal pain management plan developed . - pain assessed . - response to pain management plan monitored . - response to pharmacologic therapy monitored . - support and encouragement provided . Self-administers medications as prescribed . Attends all scheduled provider appointments . Calls pharmacy for medication refills . Calls provider office for new  concerns or questions Follow Up Plan: Telephone follow up appointment with care management team member scheduled for: 09-25-2020 at 230 pm     Task: RNCM: Pain management   Note:   Care Management Activities:    - healthy lifestyle promoted - medication side effects managed - multimodal pain management plan developed - pain assessed - response to pain management plan monitored - response to pharmacologic therapy monitored - support and encouragement provided         Patient verbalizes understanding of instructions provided today and agrees to view in [place].   Telephone follow up appointment with care management team member scheduled for: 09-25-2020 at 40 pm  Noreene Larsson RN, MSN, [place] Mobile: [phone]

## 2020-07-24 ENCOUNTER — Other Ambulatory Visit: Payer: Self-pay

## 2020-07-24 ENCOUNTER — Ambulatory Visit (INDEPENDENT_AMBULATORY_CARE_PROVIDER_SITE_OTHER): Payer: Medicare Other | Admitting: Family Medicine

## 2020-07-24 ENCOUNTER — Encounter: Payer: Self-pay | Admitting: Family Medicine

## 2020-07-24 VITALS — BP 133/57 | HR 81 | Temp 97.7°F | Resp 18 | Ht 60.0 in | Wt 229.8 lb

## 2020-07-24 DIAGNOSIS — M79641 Pain in right hand: Secondary | ICD-10-CM

## 2020-07-24 DIAGNOSIS — G5602 Carpal tunnel syndrome, left upper limb: Secondary | ICD-10-CM

## 2020-07-24 DIAGNOSIS — Z23 Encounter for immunization: Secondary | ICD-10-CM | POA: Diagnosis not present

## 2020-07-24 DIAGNOSIS — M19041 Primary osteoarthritis, right hand: Secondary | ICD-10-CM | POA: Diagnosis not present

## 2020-07-24 DIAGNOSIS — E1169 Type 2 diabetes mellitus with other specified complication: Secondary | ICD-10-CM

## 2020-07-24 DIAGNOSIS — M19042 Primary osteoarthritis, left hand: Secondary | ICD-10-CM | POA: Diagnosis not present

## 2020-07-24 DIAGNOSIS — M79642 Pain in left hand: Secondary | ICD-10-CM | POA: Diagnosis not present

## 2020-07-24 LAB — POCT GLYCOSYLATED HEMOGLOBIN (HGB A1C): Hemoglobin A1C: 6.9 % — AB (ref 4.0–5.6)

## 2020-07-24 MED ORDER — SHINGRIX 50 MCG/0.5ML IM SUSR: INTRAMUSCULAR | 1 refills | Status: DC

## 2020-07-24 NOTE — Assessment & Plan Note (Signed)
Improved A1c to 6.9 No hyperglycemia or hypoglycemia Complications - other including hyperlipidemia, GERD, obesity - increases risk of future cardiovascular complications   Plan:  Continue Trulicity 0.75mg  weekly inj doing well on GLP1 Off Metformin 2. Encourage improved lifestyle - low carb, low sugar diet, reduce portion size, continue improving regular exercise 3. Check CBG, bring log to next visit for review 4. Continue ASA, ARB, Statin

## 2020-07-24 NOTE — Patient Instructions (Addendum)
Thank you for coming to the office today.  Shingrix shingles vaccine 2 dose series. 2 doses 2-6 month apart, printed rx, check w/ insurance for cost, and get at pharmacy.  May have return of Carpal Tunnel Referral to Neurology  [facility] - Neurology Dept [place], [place] [address] Phone: [phone]  X-ray for Left hand today, to check for arthritis  Increase Diclofenac / Voltaren topical up to 4 times a day  Recommend Thumb Spica Splint to protect.  Limit excess writing. Or take breaks between.  Recommend to start taking Tylenol Extra Strength 500mg  tabs - take 1 to 2 tabs per dose (max 1000mg ) every 6-8 hours for pain (take regularly, don't skip a dose for next 7 days), max 24 hour daily dose is 6 tablets or 3000mg . In the future you can repeat the same everyday Tylenol course for 1-2 weeks at a time.    Please schedule a Follow-up Appointment to: Return in about 4 months (around 11/23/2020) for 4 month follow-up DM A1c, Hand follow-up.  If you have any other questions or concerns, please feel free to call the office or send a message through [place]. You may also schedule an earlier appointment if necessary.  Additionally, you may be receiving a survey about your experience at our office within a few days to 1 week by e-mail or mail. We value your feedback.  [name], DO [place]

## 2020-07-24 NOTE — Progress Notes (Signed)
Subjective:    Patient ID: Diana Harmon, female    DOB: 1945/06/21, 75 y.o.   MRN: [id number]  Diana Harmon is a 75 y.o. female presenting on 07/24/2020 for Diabetes and Hand Pain (Bilateral hand pain x 5 mths. Pt state throbbing pain mostly in the left hand that radiates up the arm. It started off intermittent, but now it constant. Difficulty with gripping and holding thing in both hands. )   HPI    CHRONIC DM, Type 2with Hyperlipidemia, Morbid Obesity HTN  Previously A1c 7-8 range. Today due for A1c Admits diet changes since last visit had been on Trulicity 0.75mg  weekly inj and major improved diet Side effect on metformin has stopped. CBGs: Improved. Avg 176H Meds - Trulicity 0.75mg  weekly inj Reports good compliance, tolerating med well Currently on ARB, ASA 81, Simvastatin 20mg  Lifestyle: Improving DM diet. Low carb - Followed by Dr Lorie Apley optometry Denies hypoglycemia  Bilateral Hand Pain, weakness Left > Right New problem onset >6 months She is writing a lot doing letters and it does interfere, she gets hand cramping muscles at times. Admits swelling in hands, worse in AM. Difficulty with grip and feeling weaker. Worse with R 4th ring finger if extending finger worse, pain radiates into forearm. Feels limited with both thumbs on each side of hand. She does some hand exercises with a ball to help  History of L hand carpal tunnel surgery years ago. She has tried Voltaren topical PRN without relief. Icy hot PRN.   Health Maintenance: Need shingrix  Depression screen [facility] 2/9 04/25/2020 03/10/2020 09/14/2019  Decreased Interest 0 0 0  Down, Depressed, Hopeless 0 0 0  PHQ - 2 Score 0 0 0    Social History   Tobacco Use  . Smoking status: Former Smoker    Years: 20.00    Types: Cigarettes    Quit date: 09/05/1979    Years since quitting: 40.9  . Smokeless tobacco: Former Network engineer  . Vaping Use: Never used  Substance Use Topics   . Alcohol use: Yes    Alcohol/week: 0.0 - 1.0 standard drinks    Comment: occ. wine  . Drug use: No    Review of Systems Per HPI unless specifically indicated above     Objective:    BP (!) 133/57 (BP Location: Left Arm, Patient Position: Sitting, Cuff Size: Large)   Pulse 81   Temp 97.7 F (36.5 C) (Temporal)   Resp 18   Ht 5' (1.524 m)   Wt 229 lb 12.8 oz (104.2 kg)   SpO2 100%   BMI 44.88 kg/m   Wt Readings from Last 3 Encounters:  07/24/20 229 lb 12.8 oz (104.2 kg)  07/13/20 223 lb (101.2 kg)  05/03/20 227 lb (103 kg)    Physical Exam Vitals and nursing note reviewed.  Constitutional:      General: She is not in acute distress.    Appearance: She is well-developed. She is obese. She is not diaphoretic.     Comments: Well-appearing, comfortable, cooperative  HENT:     Head: Normocephalic and atraumatic.  Eyes:     General:        Right eye: No discharge.        Left eye: No discharge.     Conjunctiva/sclera: Conjunctivae normal.  Neck:     Thyroid: No thyromegaly.  Cardiovascular:     Rate and Rhythm: Normal rate and regular rhythm.     Heart sounds:  Normal heart sounds. No murmur heard.   Pulmonary:     Effort: Pulmonary effort is normal. No respiratory distress.     Breath sounds: Normal breath sounds. No wheezing or rales.  Musculoskeletal:     Cervical back: Normal range of motion and neck supple.     Comments: Bilateral hands L>R without deformity, has some reduced sensation L 4th finger, reproduced pain with wrist flexion at times, with ranges of motion, reduced strength slightly, no triggering finger  Lymphadenopathy:     Cervical: No cervical adenopathy.  Skin:    General: Skin is warm and dry.     Findings: No erythema or rash.  Neurological:     Mental Status: She is alert and oriented to person, place, and time.  Psychiatric:        Behavior: Behavior normal.     Comments: Well groomed, good eye contact, normal speech and thoughts        Results for orders placed or performed in visit on 07/24/20  POCT glycosylated hemoglobin (Hb A1C)  Result Value Ref Range   Hemoglobin A1C 6.9 (A) 4.0 - 5.6 %      Assessment & Plan:   Problem List Items Addressed This Visit    Type 2 diabetes mellitus with other specified complication ([place]) - Primary    Improved A1c to 6.9 No hyperglycemia or hypoglycemia Complications - other including hyperlipidemia, GERD, obesity - increases risk of future cardiovascular complications   Plan:  Continue Trulicity 0.75mg  weekly inj doing well on GLP1 Off Metformin 2. Encourage improved lifestyle - low carb, low sugar diet, reduce portion size, continue improving regular exercise 3. Check CBG, bring log to next visit for review 4. Continue ASA, ARB, Statin       Relevant Orders   POCT glycosylated hemoglobin (Hb A1C) (Completed)   Arthritis, degenerative   Relevant Orders   DG Hand Complete Left    Other Visit Diagnoses    Need for shingles vaccine       Relevant Medications   SHINGRIX injection   Pain in both hands       Relevant Orders   DG Hand Complete Left   Carpal tunnel syndrome of left wrist       Relevant Orders   Ambulatory referral to Neurology       Referral to Neurology for evaluation of Left hand history of carpal tunnel surgery years ago. Now having worsening 6 months weakness, pain, radiating into forearm, has symptoms of arthritis bilateral but also with nerve impingement and carpal tunnel. Requesting nerve conduction study and evaluation  - Defer prednisone or rx steroid, would increase usage of topical NSAID   Orders Placed This Encounter  Procedures  . DG Hand Complete Left    Standing Status:   Future    Standing Expiration Date:   07/24/2021    Order Specific Question:   Reason for Exam (SYMPTOM  OR DIAGNOSIS REQUIRED)    Answer:   left hand pain and swelling generalized, >6 months, history arthritis carpal tunnel, no injury    Order Specific Question:    Preferred imaging location?    Answer:   [facility]  . Ambulatory referral to Neurology    Referral Priority:   Routine    Referral Type:   Consultation    Referral Reason:   Specialty Services Required    Requested Specialty:   Neurology    Number of Visits Requested:   1  . POCT glycosylated hemoglobin (Hb  A1C)     Meds ordered this encounter  Medications  . SHINGRIX injection    Sig: Inject 0.5 mL into muscle for first dose, then repeat in 2-6 months.    Dispense:  0.5 mL    Refill:  1      Follow up plan: Return in about 4 months (around 11/23/2020) for 4 month follow-up DM A1c, Hand follow-up.    [name], [facility] 07/24/2020, 10:31 AM

## 2020-09-10 ENCOUNTER — Other Ambulatory Visit: Payer: Self-pay | Admitting: Family Medicine

## 2020-09-10 DIAGNOSIS — E1169 Type 2 diabetes mellitus with other specified complication: Secondary | ICD-10-CM

## 2020-09-11 NOTE — Telephone Encounter (Signed)
Requested Prescriptions  Pending Prescriptions Disp Refills  . TRULICITY 8.46 NG/2.9BM SOPN [Pharmacy Med Name: Trulicity 8.41 LK/4.4WN Subcutaneous Solution Pen-injector] 4 mL 0    Sig: INJECT 0.75 MG INTO THE SKIN ONCE A WEEK     Endocrinology:  Diabetes - GLP-1 Receptor Agonists Passed - 09/10/2020  8:25 PM      Passed - HBA1C is between 0 and 7.9 and within 180 days    Hemoglobin A1C  Date Value Ref Range Status  07/24/2020 6.9 (A) 4.0 - 5.6 % Final   Hgb A1c MFr Bld  Date Value Ref Range Status  11/05/2019 7.7 (H) 4.8 - 5.6 % Final    Comment:    (NOTE) Pre diabetes:          5.7%-6.4%  Diabetes:              >6.4%  Glycemic control for   <7.0% adults with diabetes          Passed - Valid encounter within last 6 months    Recent Outpatient Visits          1 month ago Type 2 diabetes mellitus with other specified complication, without long-term current use of insulin ([place])   [place], [place]   6 months ago Type 2 diabetes mellitus with other specified complication, without long-term current use of insulin ([place])   [facility] [name], DO   9 months ago SIRS (systemic inflammatory response syndrome) [facility])   [place], DO   12 months ago Herpes zoster without complication   [place], DO   1 year ago Type 2 diabetes mellitus with other specified complication, without long-term current use of insulin ([place])   [facility] Parks Ranger, Devonne Doughty, DO      Future Appointments            In 2 months [place], Devonne Doughty, DO [facility] [place]   In 7 months  [facility]

## 2020-09-12 ENCOUNTER — Other Ambulatory Visit: Payer: Self-pay | Admitting: Physician Assistant

## 2020-09-12 DIAGNOSIS — Z952 Presence of prosthetic heart valve: Secondary | ICD-10-CM

## 2020-09-13 ENCOUNTER — Other Ambulatory Visit: Payer: Self-pay | Admitting: Family Medicine

## 2020-09-13 DIAGNOSIS — E1169 Type 2 diabetes mellitus with other specified complication: Secondary | ICD-10-CM

## 2020-09-25 ENCOUNTER — Telehealth: Payer: Self-pay | Admitting: General Practice

## 2020-09-25 ENCOUNTER — Ambulatory Visit (INDEPENDENT_AMBULATORY_CARE_PROVIDER_SITE_OTHER): Payer: Medicare Other | Admitting: General Practice

## 2020-09-25 DIAGNOSIS — E785 Hyperlipidemia, unspecified: Secondary | ICD-10-CM | POA: Diagnosis not present

## 2020-09-25 DIAGNOSIS — I10 Essential (primary) hypertension: Secondary | ICD-10-CM

## 2020-09-25 DIAGNOSIS — M79642 Pain in left hand: Secondary | ICD-10-CM

## 2020-09-25 DIAGNOSIS — M19042 Primary osteoarthritis, left hand: Secondary | ICD-10-CM | POA: Diagnosis not present

## 2020-09-25 DIAGNOSIS — M19041 Primary osteoarthritis, right hand: Secondary | ICD-10-CM | POA: Diagnosis not present

## 2020-09-25 DIAGNOSIS — M79641 Pain in right hand: Secondary | ICD-10-CM

## 2020-09-25 DIAGNOSIS — I5032 Chronic diastolic (congestive) heart failure: Secondary | ICD-10-CM | POA: Diagnosis not present

## 2020-09-25 DIAGNOSIS — E1169 Type 2 diabetes mellitus with other specified complication: Secondary | ICD-10-CM

## 2020-09-25 NOTE — Chronic Care Management (AMB) (Signed)
Chronic Care Management   CCM RN Visit Note  09/25/2020 Name: Diana Harmon MRN: [id number] DOB: 1945/10/13  Subjective: Diana Harmon is a 75 y.o. year old female who is a primary care patient of [name], DO. The care management team was consulted for assistance with disease management and care coordination needs.    Engaged with patient by telephone for follow up visit in response to provider referral for case management and/or care coordination services.   Consent to Services:  The patient was given information about Chronic Care Management services, agreed to services, and gave verbal consent prior to initiation of services.  Please see initial visit note for detailed documentation.   Patient agreed to services and verbal consent obtained.   Assessment: Review of patient past medical history, allergies, medications, health status, including review of consultants reports, laboratory and other test data, was performed as part of comprehensive evaluation and provision of chronic care management services.   SDOH (Social Determinants of Health) assessments and interventions performed:    CCM Care Plan  Allergies  Allergen Reactions  . Ferumoxytol Swelling    Swelling in neck  . Iron   . Other     Other reaction(s): Other (See Comments) She refuses blood products-Jehovah witness  . Penicillins Rash, Swelling and Hives    Outpatient Encounter Medications as of 09/25/2020  Medication Sig  . ACCU-CHEK GUIDE test strip Use to check blood sugar twice daily  . Accu-Chek Softclix Lancets lancets Use to check blood sugar twice daily  . acetaminophen (TYLENOL) 500 MG tablet Take 1,000 mg by mouth every 6 (six) hours as needed for moderate pain.   . Artificial Tear Solution (GENTEAL TEARS) 0.1-0.2-0.3 % SOLN Place 1 drop into both eyes 3 (three) times daily as needed (dry/irritated eyes.).  Marland Kitchen aspirin 81 MG chewable tablet Chew 1 tablet (81 mg total) by  mouth daily.  . B COMPLEX-C PO Take 10 mLs by mouth daily.  . baclofen (LIORESAL) 10 MG tablet TAKE 1 TO 2 TABLETS BY MOUTH TWICE DAILY AS NEEDED FOR MUSCLE SPASM  . Blood Glucose Monitoring Suppl (ACCU-CHEK GUIDE) w/Device KIT Use to check blood sugar twice daily  . cephALEXin (KEFLEX) 500 MG capsule Take 4 tablets (2000 mg) by mouth 60 minutes prior to any dental procedures or cleanings (Patient not taking: No sig reported)  . Cholecalciferol (VITAMIN D3) 125 MCG (5000 UT) TABS Take 5,000 Units by mouth daily.  . clopidogrel (PLAVIX) 75 MG tablet Take 1 tablet (75 mg total) by mouth daily with breakfast. (Patient not taking: Reported on 07/24/2020)  . Ferrous Sulfate (IRON) 325 (65 Fe) MG TABS TAKE 1 TABLET BY MOUTH TWICE DAILY WITH A MEAL  . furosemide (LASIX) 20 MG tablet Take 1 tablet (20 mg total) by mouth daily as needed for edema.  . hydrochlorothiazide (HYDRODIURIL) 25 MG tablet Take 1 tablet (25 mg total) by mouth daily.  Marland Kitchen lactobacillus acidophilus (BACID) TABS tablet Take 2 tablets by mouth 2 (two) times daily as needed (digestive health.).   Marland Kitchen levocetirizine (XYZAL) 5 MG tablet TAKE 1 TABLET BY MOUTH ONCE DAILY IN THE EVENING  . losartan (COZAAR) 50 MG tablet Take 1 tablet (50 mg total) by mouth daily.  Marland Kitchen SHINGRIX injection Inject 0.5 mL into muscle for first dose, then repeat in 2-6 months.  . simvastatin (ZOCOR) 20 MG tablet TAKE 1 TABLET BY MOUTH ONCE DAILY AT  6  PM  . TRULICITY 0.09 FG/1.8EX SOPN INJECT 0.75 MG INTO  THE SKIN ONCE A WEEK  . [DISCONTINUED] Cromolyn Sodium (NASAL ALLERGY NA) Place 1 spray into the nose daily as needed (allergies).   No facility-administered encounter medications on file as of 09/25/2020.    Patient Active Problem List   Diagnosis Date Noted  . S/P TAVR (transcatheter aortic valve replacement) 11/09/2019  . Patient is [name]   . Severe aortic stenosis   . Thyroid disease   . Chronic heart failure with preserved ejection fraction  (HFpEF) ([place]) 09/30/2019  . Normocytic anemia 02/17/2019  . Morbid obesity ([place]) 11/05/2017  . Neutropenia ([place]) 04/24/2017  . Vitamin D deficiency 10/08/2016  . Meniere's disease of right ear 07/04/2016  . TIA (transient ischemic attack) 07/04/2016  . Nonrheumatic aortic valve stenosis 07/04/2016  . Seasonal allergies 01/30/2016  . Recurrent UTI 10/26/2015  . Iron deficiency anemia due to chronic blood loss 02/28/2015  . History of adenomatous polyp of colon 02/13/2015  . Osteoarthritis of both knees 12/05/2014  . Acid reflux 12/05/2014  . Benign neoplasm of colon 12/05/2014  . Type 2 diabetes mellitus with other specified complication ([place]) 95/62/1308  . Essential hypertension 12/05/2014  . Hyperlipidemia associated with type 2 diabetes mellitus ([place]) 12/05/2014  . Arthritis, degenerative 12/05/2014  . Anemia due to multiple mechanisms 12/05/2014  . Abnormal liver enzymes 12/05/2014    Conditions to be addressed/monitored:CHF, HTN, HLD, DMII and osteoarthritis   Care Plan : RNCM: Heart Failure (Adult)  Updates made by Vanita Ingles since 09/25/2020 12:00 AM    Problem: RNCM: Management of HF   Priority: Medium    Long-Range Goal: RNCM: HF Symptom Exacerbation Prevented or Minimized   Priority: Medium  Note:   Current Barriers:  Wt Readings from Last 3 Encounters:  09/23/20 231 lb (104.8 kg)  07/24/20 229 lb 12.8 oz (104.2 kg)  07/13/20 223 lb (101.2 kg)    . Knowledge deficits related to basic heart failure pathophysiology and self care management . Unable to independently manage HF . Does not contact provider office for questions/concerns . Lack of scale in home- weighs every other day- 09-25-2020: Consistently weighing  . Financial strain  Occupational hygienist):   Over the next 120 days, patient will weigh self daily and record  Over the next 120 days, patient will verbalize understanding of Heart Failure Action Plan and when to call doctor  Over  the next 120 days, patient will take all Heart Failure mediations as prescribed Interventions:  . Collaboration with [name], DO regarding development and update of comprehensive plan of care as evidenced by provider attestation and co-signature . Inter-disciplinary care team collaboration (see longitudinal plan of care) . Basic overview and discussion of pathophysiology of Heart Failure . Provided written and verbal education on low sodium diet . Reviewed Heart Failure Action Plan in depth and provided written copy . Assessed for scales in home . Discussed importance of daily weight . Reviewed role of diuretics in prevention of fluid overload. 09-25-2020: The patient is compliant with Lasix, has some edema noted in his feet or legs. Is having some swelling in her hands but likely from OA pain. Will see specialist for that in June.   Patient Goals/Self-Care Activities . Over the next 120 days, patient will:  - Take Heart Failure Medications as prescribed - Weigh daily and record (notify MD with 3 lb weight gain over night or 5 lb in a week) - Follow CHF Action Plan- 09-25-2020: Following plan of care - Adhere to  low sodium diet- 09-25-2020: The patient states she sometimes slips and doesn't eat right - barriers to lifestyle changes reviewed and addressed - barriers to treatment reviewed and addressed - self-awareness of signs/symptoms of worsening disease encouraged Follow Up Plan: Telephone follow up appointment with care management team member scheduled for: 12-04-2020 at 230 pm   Care Plan : RNCM: Hypertension (Adult)  Updates made by Vanita Ingles since 09/25/2020 12:00 AM    Problem: RNCM: Hypertension (Hypertension)   Priority: Medium    Goal: RNCM: Hypertension Monitored   Note:   Objective:  . Last practice recorded BP readings:  . BP Readings from Last 3 Encounters: .  09/25/20 . (!) 125/58 .  07/24/20 . (!) 133/57 .  07/13/20 . (!) 145/62 .     Marland Kitchen Most recent  eGFR/CrCl: No results found for: EGFR  No components found for: CRCL Current Barriers:  Marland Kitchen Knowledge Deficits related to basic understanding of hypertension pathophysiology and self care management . Knowledge Deficits related to understanding of medications prescribed for management of hypertension . Limited Social Support . Unable to independently manage HTN . Lacks social connections . Does not contact provider office for questions/concerns Case Manager Clinical Goal(s):  Marland Kitchen Over the next 120 days, patient will verbalize understanding of plan for hypertension management . Over the next 120 days, patient will demonstrate improved adherence to prescribed treatment plan for hypertension as evidenced by taking all medications as prescribed, monitoring and recording blood pressure as directed, adhering to low sodium/DASH diet . Over the next 120 days, patient will demonstrate improved health management independence as evidenced by checking blood pressure as directed and notifying PCP if SBP>160 or DBP > 90, taking all medications as prescribe, and adhering to a low sodium diet as discussed. Interventions:  . Collaboration with [name], DO regarding development and update of comprehensive plan of care as evidenced by provider attestation and co-signature . Inter-disciplinary care team collaboration (see longitudinal plan of care) . Evaluation of current treatment plan related to hypertension self management and patient's adherence to plan as established by provider. 09-25-2020: The patient states that she is consistently taking her blood pressure. Discussed systolic <147 or <82 diastolic. The patient little concerned at diastolic 57.  Looking at her baseline she is consistently around 56 to 58.  Marland Kitchen Provided education to patient re: stroke prevention, s/s of heart attack and stroke, DASH diet, complications of uncontrolled blood pressure . Evaluation of blood pressure readings. The patient  states her blood pressure is doing well. Last reading was 145/62, pulse 78 at her oncology appointment last week. States this is a little high for her. States at home today is was 123/65.  Marland Kitchen Reviewed medications with patient and discussed importance of compliance. 09-25-2020: The patient is compliant with medications regimen. The patient denies issues with medications  . Discussed plans with patient for ongoing care management follow up and provided patient with direct contact information for care management team . Advised patient, providing education and rationale, to monitor blood pressure daily and record, calling PCP for findings outside established parameters.  Patient Goals/Self-Care Activities . Over the next 120 days, patient will:  - - blood pressure trends reviewed - depression screen reviewed - home or ambulatory blood pressure monitoring encouraged  Follow Up Plan: Telephone follow up appointment with care management team member scheduled for: 12-04-2020 at 230  pm   Care Plan : RNCM: Diabetes Type 2 (Adult)  Updates made by Vanita Ingles since  09/25/2020 12:00 AM    Problem: RNCM: Glycemic Management (Diabetes, Type 2)   Priority: Medium    Long-Range Goal: RNCM: Glycemic Management Optimized   Priority: Medium  Note:   Objective:  . Lab Results .  Component . Value . Date .   Marland Kitchen HGBA1C . 6.9 (A) . 07/24/2020 .    Lab Results  Component Value Date   CREATININE 1.36 (H) 05/03/2020   CREATININE 1.19 (H) 01/14/2020   CREATININE 0.90 11/13/2019 .   Marland Kitchen No results found for: EGFR Current Barriers:  Marland Kitchen Knowledge Deficits related to basic Diabetes pathophysiology and self care/management . Knowledge Deficits related to medications used for management of diabetes . Limited Social Support . Unable to independently manage DM . Lacks social connections . Does not contact provider office for questions/concerns Case Manager Clinical Goal(s):  Marland Kitchen Collaboration with [name], DO regarding development and update of comprehensive plan of care as evidenced by provider attestation and co-signature . Inter-disciplinary care team collaboration (see longitudinal plan of care) . Over the next 120 days, patient will demonstrate improved adherence to prescribed treatment plan for diabetes self care/management as evidenced by:  . daily monitoring and recording of CBG  . adherence to ADA/ carb modified diet . adherence to prescribed medication regimen Interventions:  . Provided education to patient about basic DM disease process . Reviewed medications with patient and discussed importance of medication adherence. 09-25-2020: Is compliant with medications, denies any issues with cost constraints  . Discussed plans with patient for ongoing care management follow up and provided patient with direct contact information for care management team . Provided patient with written educational materials related to hypo and hyperglycemia and importance of correct treatment. 09-25-2020: Denies any low blood sugars. Her range is 120-209. She is eating well and monitoring dietary intake.  . Review of patient status, including review of consultants reports, relevant laboratory and other test results, and medications completed. Patient Goals/Self-Care Activities . Over the next 120 days, patient will:  - Self administers oral medications as prescribed Attends all scheduled provider appointments Checks blood sugars as prescribed and utilize hyper and hypoglycemia protocol as needed Adheres to prescribed ADA/carb modified - barriers to adherence to treatment plan identified - blood glucose readings reviewed- range is 120 to 144 - mutual A1C goal set or reviewed - resources required to improve adherence to care identified - self-awareness of signs/symptoms of hypo or hyperglycemia encouraged - use of blood glucose monitoring log promoted Follow Up Plan: Telephone follow up appointment  with care management team member scheduled for: 12-04-2020 at 230  pm   Care Plan : RNCM: HLD managment  Updates made by Vanita Ingles since 09/25/2020 12:00 AM    Problem: RNCM: HLD management   Priority: Medium    Long-Range Goal: RNCM: HLD Health Literacy Improved   Priority: Medium  Note:   Current Barriers:  . Poorly controlled hyperlipidemia, complicated by HTN, DM . Current antihyperlipidemic regimen: Zocor 75m QD . Most recent lipid panel:     Component Value Date/Time   CHOL 170 05/03/2020 0935   TRIG 103 05/03/2020 0935   HDL 54 05/03/2020 0935   CHOLHDL 3.1 05/03/2020 0935   CHOLHDL 2.4 07/21/2017 0000   VLDL 26 06/03/2016 0001   LDLCALC 97 05/03/2020 0935   LDLCALC 77 07/21/2017 0000 .   .Marland KitchenASCVD risk enhancing conditions: age >>74 DM, HTN, CHF . Unable to independently manage HLD  . Lacks social connections . Does not  contact provider office for questions/concerns  RN Care Manager Clinical Goal(s):  Marland Kitchen Over the next 120 days, patient will work with Consulting civil engineer, providers, and care team towards execution of optimized self-health management plan . Over the next 120 days, patient will verbalize understanding of plan for effective management of HLD . Over the next 120 days, patient will work with [facility] and pcp to address needs related to compliance, heart healthy diet, and working with the CCM team for optimal health and well being.   Interventions: . Collaboration with [name], DO regarding development and update of comprehensive plan of care as evidenced by provider attestation and co-signature . Inter-disciplinary care team collaboration (see longitudinal plan of care) . Medication review performed; medication list updated in electronic medical record.  Bertram Savin care team collaboration (see longitudinal plan of care) . Referred to pharmacy team for assistance with HLD medication management . Evaluation of current treatment plan  related to HLD and patient's adherence to plan as established by provider. 09-25-2020: Denies any issues related to HLD. States compliance with heart healthy/ADA diet  . Advised patient to call the office for changes or questions   Patient Goals/Self-Care Activities: . Over the next 120 days, patient will:   - call for medicine refill 2 or 3 days before it runs out - call if I am sick and can't take my medicine - keep a list of all the medicines I take; vitamins and herbals too - learn to read medicine labels - use a pillbox to sort medicine - drink 6 to 8 glasses of water each day - eat 5 or 6 small meals each day - fill half the plate with nonstarchy vegetables - limit fast food meals to no more than 1 per week - manage portion size - read food labels for fat, fiber, carbohydrates and portion size - be open to making changes - I can manage, know and watch for signs of a heart attack - if I have chest pain, call for help - learn about small changes that will make a big difference - learn my personal risk factors  - patient's preferred learning methods utilized - questions encouraged - readiness to learn monitored  Follow Up Plan: Telephone follow up appointment with care management team member scheduled for: 12-04-2020 at 230 pm     Care Plan : RNCM: Osteoarthritis (Adult)  Updates made by Vanita Ingles since 09/25/2020 12:00 AM    Problem: RNCM: Pain (Osteoarthritis) lower back, knees, hands and sometimes feet   Priority: High    Long-Range Goal: RNCM: Manage Pain   Priority: High  Note:   Current Barriers:  Marland Kitchen Knowledge Deficits related to managing acute/chronic pain . Non-adherence to scheduled provider appointments . Non-adherence to prescribed medication regimen . Difficulty obtaining medications . Chronic Disease Management support and education needs related to chronic pain . Unable to independently manage new onset of pain in bilateral hands  . Does not contact  provider office for questions/concerns Nurse Case Manager Clinical Goal(s):  . patient will verbalize understanding of plan for managing pain. 09-24-2020:The patient is concerned about this pain and discomfort. Is hopeful the specialist will have some answers.  . patient will attend all scheduled medical appointments: The patient sees the specialist on 10-04-2020 . patient will demonstrate use of different relaxation  skills and/or diversional activities to assist with pain reduction (distraction, imagery, relaxation, massage, acupressure, TENS, heat, and cold application . patient will report pain at a level  less than 3 to 4 on a 10-10 rating scale . patient will use pharmacological and nonpharmacological pain relief strategies . patient will verbalize acceptable level of pain relief and ability to engage in desired activities . patient will engage in desired activities without an increase in pain level Interventions:  . Collaboration with [name], DO regarding development and update of comprehensive plan of care as evidenced by provider attestation and co-signature . Inter-disciplinary care team collaboration (see longitudinal plan of care) . - careful application of heat or ice encouraged . - deep breathing, relaxation and mindfulness use promoted . - effectiveness of pharmacologic therapy monitored . - medication-induced side effects managed . - misuse of pain medication assessed . - motivation and barriers to change assessed and addressed . - mutually acceptable comfort goal set . - pain assessed . - pain treatment goals reviewed . - premedication prior to activity encouraged . Evaluation of current treatment plan related to pain and discomfort  and patient's adherence to plan as established by provider. 09-25-2020: The patient is using topical pain cream. States her hands are "drawing" and it is both hands, sometimes her left is worse. The patient can not see the specialist  until 10-04-2020. She takes tylenol for pain relief. Heat does not help.  . Advised patient to call the office for changes in level or intensity of pain and discomfort . Provided education to patient re: alternate pain relief measures. Review of neuropathy pain and if the patient has had any injuries to the area. Several years ago the patient had surgery on her left hand/wrist for carpal tunnel syndrome.  . Reviewed medications with patient and discussed compliance. The patient takes extra strength Tylenol for pain relief and also use cream. She also uses heat at times.  Marland Kitchen Collaborated with pcp regarding new concern of patients hands feeling like they are "drawing".  The patient states that her left hand is worse than her right. She says she is putting creams on it but that does not help. Ask for recommendations from the pcp. Advised she may need to come in for evaluation by pcp.  . Discussed plans with patient for ongoing care management follow up and provided patient with direct contact information for care management team . Allow patient to maintain a diary of pain ratings, timing, precipitating events, medications, treatments, and what works best to relieve pain,  . Refer to support groups and self-help groups . Educate patient about the use of pharmacological interventions for pain management- antianxiety, antidepressants, NSAIDS, opioid analgesics,  . Explain the importance of lifestyle modifications to effective pain management  Patient Goals/Self Care Activities:  . - healthy lifestyle promoted . - medication side effects managed . - multimodal pain management plan developed . - pain assessed . - response to pain management plan monitored . - response to pharmacologic therapy monitored . - support and encouragement provided . Self-administers medications as prescribed . Attends all scheduled provider appointments . Calls pharmacy for medication refills . Calls provider office for new  concerns or questions Follow Up Plan: Telephone follow up appointment with care management team member scheduled for: 12-04-2020 at 230 pm       Plan:Telephone follow up appointment with care management team member scheduled for:  12-04-2020 at 11 pm  Noreene Larsson RN, MSN, [place] Mobile: [phone]

## 2020-09-25 NOTE — Patient Instructions (Signed)
Visit Information  PATIENT GOALS: Goals Addressed            This Visit's Progress   . RNCM: Manage Pain-Osteoarthritis       Timeframe:  Long-Range Goal Priority:  High Start Date:    07-17-2020                         Expected End Date:    10-02-2021                  Follow Up Date 12-04-2020   - call for medicine refill 2 or 3 days before it runs out - develop a personal pain management plan - keep track of prescription refills - plan exercise or activity when pain is best controlled - prioritize tasks for the day - start a pain diary - stay active - track times pain is worst and when it is best - track what makes the pain worse and what makes it better - use ice or heat for pain relief - work slower and less intense when having pain    Why is this important?    Day-to-day life can be hard when you have joint pain.   Pain medicine is just one piece of the treatment puzzle. There are many things you can do to manage pain and stay strong.    Lifestyle changes like stopping smoking and eating foods with Vitamin D and calcium keeps your bones and muscles healthy. Your joints are better when supported by strong muscles.   You can try these action steps to help you manage your pain.     Notes: Pain in back and knees and now in hands and sometimes feet. States: "My hands feel like they are drawing" 09-25-2020: The patient is going to see a specialist for her hands on 10-04-2020      Patient Care Plan: RNCM: Heart Failure (Adult)    Problem Identified: RNCM: Management of HF   Priority: Medium    Long-Range Goal: RNCM: HF Symptom Exacerbation Prevented or Minimized   Priority: Medium  Note:   Current Barriers:  Wt Readings from Last 3 Encounters:  09/23/20 231 lb (104.8 kg)  07/24/20 229 lb 12.8 oz (104.2 kg)  07/13/20 223 lb (101.2 kg)    . Knowledge deficits related to basic heart failure pathophysiology and self care management . Unable to independently manage  HF . Does not contact provider office for questions/concerns . Lack of scale in home- weighs every other day- 09-25-2020: Consistently weighing  . Financial strain  Occupational hygienist):   Over the next 120 days, patient will weigh self daily and record  Over the next 120 days, patient will verbalize understanding of Heart Failure Action Plan and when to call doctor  Over the next 120 days, patient will take all Heart Failure mediations as prescribed Interventions:  . Collaboration with [name], DO regarding development and update of comprehensive plan of care as evidenced by provider attestation and co-signature . Inter-disciplinary care team collaboration (see longitudinal plan of care) . Basic overview and discussion of pathophysiology of Heart Failure . Provided written and verbal education on low sodium diet . Reviewed Heart Failure Action Plan in depth and provided written copy . Assessed for scales in home . Discussed importance of daily weight . Reviewed role of diuretics in prevention of fluid overload. 09-25-2020: The patient is compliant with Lasix, has some edema noted in his feet or  legs. Is having some swelling in her hands but likely from OA pain. Will see specialist for that in June.   Patient Goals/Self-Care Activities . Over the next 120 days, patient will:  - Take Heart Failure Medications as prescribed - Weigh daily and record (notify MD with 3 lb weight gain over night or 5 lb in a week) - Follow CHF Action Plan- 09-25-2020: Following plan of care - Adhere to low sodium diet- 09-25-2020: The patient states she sometimes slips and doesn't eat right - barriers to lifestyle changes reviewed and addressed - barriers to treatment reviewed and addressed - self-awareness of signs/symptoms of worsening disease encouraged Follow Up Plan: Telephone follow up appointment with care management team member scheduled for: 12-04-2020 at 230 pm   Task:  RNCM: Identify and Minimize Risk of Heart Failure Exacerbation   Note:   Care Management Activities:    - barriers to lifestyle changes reviewed and addressed - barriers to treatment reviewed and addressed - self-awareness of signs/symptoms of worsening disease encouraged       Patient Care Plan: RNCM: Hypertension (Adult)    Problem Identified: RNCM: Hypertension (Hypertension)   Priority: Medium    Goal: RNCM: Hypertension Monitored   Note:   Objective:  . Last practice recorded BP readings:  . BP Readings from Last 3 Encounters: .  09/25/20 . (!) 125/58 .  07/24/20 . (!) 133/57 .  07/13/20 . (!) 145/62 .     Marland Kitchen Most recent eGFR/CrCl: No results found for: EGFR  No components found for: CRCL Current Barriers:  Marland Kitchen Knowledge Deficits related to basic understanding of hypertension pathophysiology and self care management . Knowledge Deficits related to understanding of medications prescribed for management of hypertension . Limited Social Support . Unable to independently manage HTN . Lacks social connections . Does not contact provider office for questions/concerns Case Manager Clinical Goal(s):  Marland Kitchen Over the next 120 days, patient will verbalize understanding of plan for hypertension management . Over the next 120 days, patient will demonstrate improved adherence to prescribed treatment plan for hypertension as evidenced by taking all medications as prescribed, monitoring and recording blood pressure as directed, adhering to low sodium/DASH diet . Over the next 120 days, patient will demonstrate improved health management independence as evidenced by checking blood pressure as directed and notifying PCP if SBP>160 or DBP > 90, taking all medications as prescribe, and adhering to a low sodium diet as discussed. Interventions:  . Collaboration with [name], DO regarding development and update of comprehensive plan of care as evidenced by provider attestation and  co-signature . Inter-disciplinary care team collaboration (see longitudinal plan of care) . Evaluation of current treatment plan related to hypertension self management and patient's adherence to plan as established by provider. 09-25-2020: The patient states that she is consistently taking her blood pressure. Discussed systolic <997 or <74 diastolic. The patient little concerned at diastolic 57.  Looking at her baseline she is consistently around 56 to 58.  Marland Kitchen Provided education to patient re: stroke prevention, s/s of heart attack and stroke, DASH diet, complications of uncontrolled blood pressure . Evaluation of blood pressure readings. The patient states her blood pressure is doing well. Last reading was 145/62, pulse 78 at her oncology appointment last week. States this is a little high for her. States at home today is was 123/65.  Marland Kitchen Reviewed medications with patient and discussed importance of compliance. 09-25-2020: The patient is compliant with medications regimen. The patient denies issues with  medications  . Discussed plans with patient for ongoing care management follow up and provided patient with direct contact information for care management team . Advised patient, providing education and rationale, to monitor blood pressure daily and record, calling PCP for findings outside established parameters.  Patient Goals/Self-Care Activities . Over the next 120 days, patient will:  - - blood pressure trends reviewed - depression screen reviewed - home or ambulatory blood pressure monitoring encouraged  Follow Up Plan: Telephone follow up appointment with care management team member scheduled for: 12-04-2020 at 230  pm   Task: RNCM: Identify and Monitor Blood Pressure Elevation   Note:   Care Management Activities:    - blood pressure trends reviewed - depression screen reviewed - home or ambulatory blood pressure monitoring encouraged       Patient Care Plan: RNCM: Diabetes Type 2 (Adult)     Problem Identified: RNCM: Glycemic Management (Diabetes, Type 2)   Priority: Medium    Long-Range Goal: RNCM: Glycemic Management Optimized   Priority: Medium  Note:   Objective:  . Lab Results .  Component . Value . Date .   Marland Kitchen HGBA1C . 6.9 (A) . 07/24/2020 .    Lab Results  Component Value Date   CREATININE 1.36 (H) 05/03/2020   CREATININE 1.19 (H) 01/14/2020   CREATININE 0.90 11/13/2019 .   Marland Kitchen No results found for: EGFR Current Barriers:  Marland Kitchen Knowledge Deficits related to basic Diabetes pathophysiology and self care/management . Knowledge Deficits related to medications used for management of diabetes . Limited Social Support . Unable to independently manage DM . Lacks social connections . Does not contact provider office for questions/concerns Case Manager Clinical Goal(s):  Marland Kitchen Collaboration with [name], DO regarding development and update of comprehensive plan of care as evidenced by provider attestation and co-signature . Inter-disciplinary care team collaboration (see longitudinal plan of care) . Over the next 120 days, patient will demonstrate improved adherence to prescribed treatment plan for diabetes self care/management as evidenced by:  . daily monitoring and recording of CBG  . adherence to ADA/ carb modified diet . adherence to prescribed medication regimen Interventions:  . Provided education to patient about basic DM disease process . Reviewed medications with patient and discussed importance of medication adherence. 09-25-2020: Is compliant with medications, denies any issues with cost constraints  . Discussed plans with patient for ongoing care management follow up and provided patient with direct contact information for care management team . Provided patient with written educational materials related to hypo and hyperglycemia and importance of correct treatment. 09-25-2020: Denies any low blood sugars. Her range is 120-209. She is eating well  and monitoring dietary intake.  . Review of patient status, including review of consultants reports, relevant laboratory and other test results, and medications completed. Patient Goals/Self-Care Activities . Over the next 120 days, patient will:  - Self administers oral medications as prescribed Attends all scheduled provider appointments Checks blood sugars as prescribed and utilize hyper and hypoglycemia protocol as needed Adheres to prescribed ADA/carb modified - barriers to adherence to treatment plan identified - blood glucose readings reviewed- range is 120 to 144 - mutual A1C goal set or reviewed - resources required to improve adherence to care identified - self-awareness of signs/symptoms of hypo or hyperglycemia encouraged - use of blood glucose monitoring log promoted Follow Up Plan: Telephone follow up appointment with care management team member scheduled for: 12-04-2020 at 230  pm   Task: RNCM: Alleviate Barriers to  Glycemic Management   Note:   Care Management Activities:    - barriers to adherence to treatment plan identified - blood glucose readings reviewed - mutual A1C goal set or reviewed - resources required to improve adherence to care identified - self-awareness of signs/symptoms of hypo or hyperglycemia encouraged - use of blood glucose monitoring log promoted       Patient Care Plan: RNCM: HLD managment    Problem Identified: RNCM: HLD management   Priority: Medium    Long-Range Goal: RNCM: HLD Health Literacy Improved   Priority: Medium  Note:   Current Barriers:  . Poorly controlled hyperlipidemia, complicated by HTN, DM . Current antihyperlipidemic regimen: Zocor 33m QD . Most recent lipid panel:     Component Value Date/Time   CHOL 170 05/03/2020 0935   TRIG 103 05/03/2020 0935   HDL 54 05/03/2020 0935   CHOLHDL 3.1 05/03/2020 0935   CHOLHDL 2.4 07/21/2017 0000   VLDL 26 06/03/2016 0001   LDLCALC 97 05/03/2020 0935   LDLCALC 77 07/21/2017  0000 .   .Marland KitchenASCVD risk enhancing conditions: age >>24 DM, HTN, CHF . Unable to independently manage HLD  . Lacks social connections . Does not contact provider office for questions/concerns  RN Care Manager Clinical Goal(s):  .Marland KitchenOver the next 120 days, patient will work with RConsulting civil engineer providers, and care team towards execution of optimized self-health management plan . Over the next 120 days, patient will verbalize understanding of plan for effective management of HLD . Over the next 120 days, [facility] to address needs related to compliance, heart healthy diet, and working with the CCM team for optimal health and well being.   Interventions: . Collaboration with KOlin Hauser DO regarding development and update of comprehensive plan of care as evidenced by provider attestation and co-signature . Inter-disciplinary care team collaboration (see longitudinal plan of care) . Medication review performed; medication list updated in electronic medical record.  .[facility] team collaboration (see longitudinal plan of care) . Referred to pharmacy team for assistance with HLD medication management . Evaluation of current treatment plan related to HLD and patient's adherence to plan as established by provider. 09-25-2020: Denies any issues related to HLD. States compliance with heart healthy/ADA diet  . Advised patient to call the office for changes or questions   Patient Goals/Self-Care Activities: . Over the next 120 days, patient will:   - call for medicine refill 2 or 3 days before it runs out - call if I am sick and can't take my medicine - keep a list of all the medicines I take; vitamins and herbals too - learn to read medicine labels - use a pillbox to sort medicine - drink 6 to 8 glasses of water each day - eat 5 or 6 small meals each day - fill half the plate with nonstarchy vegetables - limit fast food meals to no more than 1  per week - manage portion size - read food labels for fat, fiber, carbohydrates and portion size - be open to making changes - I can manage, know and watch for signs of a heart attack - if I have chest pain, call for help - learn about small changes that will make a big difference - learn my personal risk factors  - patient's preferred learning methods utilized - questions encouraged - readiness to learn monitored  Follow Up Plan: Telephone follow up appointment with care management team member  scheduled for: 12-04-2020 at 230 pm     Task: RNCM: Identify Deficit and Optimize Health Literacy   Note:   Care Management Activities:    - patient's preferred learning methods utilized - questions encouraged - readiness to learn monitored       Patient Care Plan: RNCM: Osteoarthritis (Adult)    Problem Identified: RNCM: Pain (Osteoarthritis) lower back, knees, hands and sometimes feet   Priority: High    Long-Range Goal: RNCM: Manage Pain   Priority: High  Note:   Current Barriers:  Marland Kitchen Knowledge Deficits related to managing acute/chronic pain . Non-adherence to scheduled provider appointments . Non-adherence to prescribed medication regimen . Difficulty obtaining medications . Chronic Disease Management support and education needs related to chronic pain . Unable to independently manage new onset of pain in bilateral hands  . Does not contact provider office for questions/concerns Nurse Case Manager Clinical Goal(s):  . patient will verbalize understanding of plan for managing pain. 09-24-2020:The patient is concerned about this pain and discomfort. Is hopeful the specialist will have some answers.  . patient will attend all scheduled medical appointments: The patient sees the specialist on 10-04-2020 . patient will demonstrate use of different relaxation  skills and/or diversional activities to assist with pain reduction (distraction, imagery, relaxation, massage, acupressure, TENS,  heat, and cold application . patient will report pain at a level less than 3 to 4 on a 10-10 rating scale . patient will use pharmacological and nonpharmacological pain relief strategies . patient will verbalize acceptable level of pain relief and ability to engage in desired activities . patient will engage in desired activities without an increase in pain level Interventions:  . Collaboration with [name], DO regarding development and update of comprehensive plan of care as evidenced by provider attestation and co-signature . Inter-disciplinary care team collaboration (see longitudinal plan of care) . - careful application of heat or ice encouraged . - deep breathing, relaxation and mindfulness use promoted . - effectiveness of pharmacologic therapy monitored . - medication-induced side effects managed . - misuse of pain medication assessed . - motivation and barriers to change assessed and addressed . - mutually acceptable comfort goal set . - pain assessed . - pain treatment goals reviewed . - premedication prior to activity encouraged . Evaluation of current treatment plan related to pain and discomfort  and patient's adherence to plan as established by provider. 09-25-2020: The patient is using topical pain cream. States her hands are "drawing" and it is both hands, sometimes her left is worse. The patient can not see the specialist until 10-04-2020. She takes tylenol for pain relief. Heat does not help.  . Advised patient to call the office for changes in level or intensity of pain and discomfort . Provided education to patient re: alternate pain relief measures. Review of neuropathy pain and if the patient has had any injuries to the area. Several years ago the patient had surgery on her left hand/wrist for carpal tunnel syndrome.  . Reviewed medications with patient and discussed compliance. The patient takes extra strength Tylenol for pain relief and also use cream. She  also uses heat at times.  Marland Kitchen Collaborated with pcp regarding new concern of patients hands feeling like they are "drawing".  The patient states that her left hand is worse than her right. She says she is putting creams on it but that does not help. Ask for recommendations from the pcp. Advised she may need to come in for evaluation by  pcp.  . Discussed plans with patient for ongoing care management follow up and provided patient with direct contact information for care management team . Allow patient to maintain a diary of pain ratings, timing, precipitating events, medications, treatments, and what works best to relieve pain,  . Refer to support groups and self-help groups . Educate patient about the use of pharmacological interventions for pain management- antianxiety, antidepressants, NSAIDS, opioid analgesics,  . Explain the importance of lifestyle modifications to effective pain management  Patient Goals/Self Care Activities:  . - healthy lifestyle promoted . - medication side effects managed . - multimodal pain management plan developed . - pain assessed . - response to pain management plan monitored . - response to pharmacologic therapy monitored . - support and encouragement provided . Self-administers medications as prescribed . Attends all scheduled provider appointments . Calls pharmacy for medication refills . Calls provider office for new concerns or questions Follow Up Plan: Telephone follow up appointment with care management team member scheduled for: 12-04-2020 at 230 pm     Task: RNCM: Pain management   Note:   Care Management Activities:    - healthy lifestyle promoted - medication side effects managed - multimodal pain management plan developed - pain assessed - response to pain management plan monitored - response to pharmacologic therapy monitored - support and encouragement provided         Patient verbalizes understanding of instructions provided today and  agrees to view in [place].   Telephone follow up appointment with care management team member scheduled for: 12-04-2020 at 7 pm  Noreene Larsson RN, MSN, [place] Mobile: [phone]

## 2020-10-04 DIAGNOSIS — R200 Anesthesia of skin: Secondary | ICD-10-CM | POA: Diagnosis not present

## 2020-10-04 DIAGNOSIS — Z79899 Other long term (current) drug therapy: Secondary | ICD-10-CM | POA: Diagnosis not present

## 2020-10-04 DIAGNOSIS — G2589 Other specified extrapyramidal and movement disorders: Secondary | ICD-10-CM | POA: Diagnosis not present

## 2020-10-04 DIAGNOSIS — N189 Chronic kidney disease, unspecified: Secondary | ICD-10-CM | POA: Diagnosis not present

## 2020-10-04 DIAGNOSIS — G249 Dystonia, unspecified: Secondary | ICD-10-CM | POA: Diagnosis not present

## 2020-10-04 DIAGNOSIS — N1832 Chronic kidney disease, stage 3b: Secondary | ICD-10-CM | POA: Diagnosis not present

## 2020-10-04 LAB — HEMOGLOBIN A1C: Hemoglobin A1C: 7.4

## 2020-10-05 ENCOUNTER — Other Ambulatory Visit: Payer: Self-pay | Admitting: Family Medicine

## 2020-10-05 ENCOUNTER — Other Ambulatory Visit: Payer: Self-pay | Admitting: Dermatology

## 2020-10-05 ENCOUNTER — Other Ambulatory Visit: Payer: Self-pay | Admitting: Neurology

## 2020-10-05 DIAGNOSIS — N6489 Other specified disorders of breast: Secondary | ICD-10-CM

## 2020-10-05 DIAGNOSIS — R928 Other abnormal and inconclusive findings on diagnostic imaging of breast: Secondary | ICD-10-CM

## 2020-10-05 DIAGNOSIS — R200 Anesthesia of skin: Secondary | ICD-10-CM

## 2020-10-06 DIAGNOSIS — Z79899 Other long term (current) drug therapy: Secondary | ICD-10-CM | POA: Diagnosis not present

## 2020-10-10 ENCOUNTER — Other Ambulatory Visit: Payer: Self-pay | Admitting: Family Medicine

## 2020-10-10 ENCOUNTER — Other Ambulatory Visit: Payer: Self-pay | Admitting: Physician Assistant

## 2020-10-10 DIAGNOSIS — E1169 Type 2 diabetes mellitus with other specified complication: Secondary | ICD-10-CM

## 2020-10-10 MED ORDER — TRULICITY 0.75 MG/0.5ML ~~LOC~~ SOAJ: 0.7500 mg | SUBCUTANEOUS | 0 refills | Status: DC

## 2020-10-10 NOTE — Telephone Encounter (Signed)
Medication Refill - Medication:   TRULICITY 3.61 WE/3.1VQ SOPN   Has the patient contacted their pharmacy? Yes.  contact pcp.    Preferred Pharmacy (with phone number or street name):   [place], [place] - [address]  [place] [address]  Phone: [phone] Fax: [phone]    Agent: Please be advised that RX refills may take up to 3 business days. We ask that you follow-up with your pharmacy.

## 2020-10-15 ENCOUNTER — Other Ambulatory Visit: Payer: Self-pay

## 2020-10-15 ENCOUNTER — Ambulatory Visit
Admission: RE | Admit: 2020-10-15 | Discharge: 2020-10-15 | Disposition: A | Discharge status: Home / Self Care | Payer: Medicare Other | Source: Ambulatory Visit | Attending: Neurology | Admitting: Neurology

## 2020-10-15 DIAGNOSIS — R200 Anesthesia of skin: Secondary | ICD-10-CM

## 2020-10-15 DIAGNOSIS — M50221 Other cervical disc displacement at C4-C5 level: Secondary | ICD-10-CM | POA: Diagnosis not present

## 2020-10-15 DIAGNOSIS — M47812 Spondylosis without myelopathy or radiculopathy, cervical region: Secondary | ICD-10-CM | POA: Diagnosis not present

## 2020-10-15 DIAGNOSIS — M5124 Other intervertebral disc displacement, thoracic region: Secondary | ICD-10-CM | POA: Diagnosis not present

## 2020-10-15 DIAGNOSIS — M50223 Other cervical disc displacement at C6-C7 level: Secondary | ICD-10-CM | POA: Diagnosis not present

## 2020-10-19 DIAGNOSIS — E113393 Type 2 diabetes mellitus with moderate nonproliferative diabetic retinopathy without macular edema, bilateral: Secondary | ICD-10-CM | POA: Diagnosis not present

## 2020-10-19 LAB — HM DIABETES EYE EXAM

## 2020-10-30 ENCOUNTER — Ambulatory Visit (INDEPENDENT_AMBULATORY_CARE_PROVIDER_SITE_OTHER): Payer: Medicare Other | Admitting: Pharmacist

## 2020-10-30 DIAGNOSIS — E1169 Type 2 diabetes mellitus with other specified complication: Secondary | ICD-10-CM | POA: Diagnosis not present

## 2020-10-30 DIAGNOSIS — I5032 Chronic diastolic (congestive) heart failure: Secondary | ICD-10-CM

## 2020-10-30 DIAGNOSIS — I10 Essential (primary) hypertension: Secondary | ICD-10-CM

## 2020-10-30 NOTE — Patient Instructions (Signed)
Visit Information  PATIENT GOALS:  Goals Addressed             This Visit's Progress    Pharmacy Goals       Our goal A1c is less than 7%. This corresponds with fasting sugars less than 130 and 2 hour after meal sugars less than 180. Please keep a log of your results when checking your blood sugar  Our goal bad cholesterol, or LDL, is less than 70 . This is why it is important to continue taking your simvastatin  Please check your home blood pressure, keep a log of the results and bring this with you to your medical appointments.   Feel free to call me with any questions or concerns. I look forward to our next call!  Harlow Asa, PharmD, [place], CPP Clinical Pharmacist [facility] [phone]          The patient verbalized understanding of instructions, educational materials, and care plan provided today and declined offer to receive copy of patient instructions, educational materials, and care plan.   Telephone follow up appointment with care management team member scheduled for: 7/25 at 2:30pm

## 2020-10-30 NOTE — Chronic Care Management (AMB) (Signed)
Chronic Care Management Pharmacy Note  10/30/2020 Name:  Diana Harmon MRN:  [id number] DOB:  June 13, 1945  Subjective: Diana Harmon is an 75 y.o. year old female who is a primary patient of [name], DO.  The CCM team was consulted for assistance with disease management and care coordination needs.    Receive referral from [place] for medication assistance for Trulicity.  Engaged with patient by telephone for initial visit in response to provider referral for pharmacy case management and/or care coordination services.   Consent to Services:  The patient was given information about Chronic Care Management services, agreed to services, and gave verbal consent prior to initiation of services.  Please see initial visit note for detailed documentation.   Patient Care Team: [name], DO as PCP - General (Family Medicine) End, Harrell Gave, MD as PCP - Cardiology (Cardiology) Cammie Sickle, MD as Consulting Physician (Internal Medicine) Lorelee Cover., MD (Ophthalmology) End, Harrell Gave, MD as Consulting Physician (Cardiology) Vanita Ingles, RN as Case Manager ([place]) Cammie Sickle, MD as Consulting Physician (Internal Medicine)  Recent office visits: Office Visit with PCP on 3/21  Recent consult visits: Office Visit with [facility] Neurology on 10/04/2020  Hospital visits: None in previous 6 months  Objective:  Lab Results  Component Value Date   CREATININE 1.28 (H) 07/13/2020   CREATININE 1.36 (H) 05/03/2020   CREATININE 1.19 (H) 01/14/2020    Lab Results  Component Value Date   HGBA1C 6.9 (A) 07/24/2020   Last diabetic Eye exam:  Lab Results  Component Value Date/Time   HMDIABEYEEXA No Retinopathy 04/18/2019 12:00 AM    Last diabetic Foot exam: No results found for: HMDIABFOOTEX      Component Value Date/Time   CHOL 170 05/03/2020 0935   TRIG 103 05/03/2020 0935   HDL 54  05/03/2020 0935   CHOLHDL 3.1 05/03/2020 0935   CHOLHDL 2.4 07/21/2017 0000   VLDL 26 06/03/2016 0001   LDLCALC 97 05/03/2020 0935   LDLCALC 77 07/21/2017 0000    Hepatic Function Latest Ref Rng & Units 05/03/2020 11/12/2019 11/10/2019  Total Protein 6.0 - 8.5 g/dL 7.3 6.3(L) 7.2  Albumin 3.7 - 4.7 g/dL 3.9 2.5(L) 3.1(L)  AST 0 - 40 IU/L 41(H) 34 45(H)  ALT 0 - 32 IU/L 26 29 33  Alk Phosphatase 44 - 121 IU/L 72 43 54  Total Bilirubin 0.0 - 1.2 mg/dL 0.2 0.6 0.5  Bilirubin, Direct 0.00 - 0.40 mg/dL - - -     Lab Results  Component Value Date/Time   VD25OH 53 07/21/2017 12:00 AM    Social History   Tobacco Use  Smoking Status Former   Years: 20.00   Pack years: 0.00   Types: Cigarettes   Quit date: 09/05/1979   Years since quitting: 41.1  Smokeless Tobacco Former   BP Readings from Last 3 Encounters:  09/25/20 (!) 125/58  07/24/20 (!) 133/57  07/13/20 (!) 145/62   Pulse Readings from Last 3 Encounters:  07/24/20 81  07/13/20 78  05/03/20 93   Wt Readings from Last 3 Encounters:  09/23/20 231 lb (104.8 kg)  07/24/20 229 lb 12.8 oz (104.2 kg)  07/13/20 223 lb (101.2 kg)    Assessment: Review of patient past medical history, allergies, medications, health status, including review of consultants reports, laboratory and other test data, was performed as part of comprehensive evaluation and provision of chronic care management services.   SDOH:  (Social Determinants  of Health) assessments and interventions performed: yes SDOH Interventions    Flowsheet Row Most Recent Value  SDOH Interventions   SDOH Interventions for the Following Domains Physical Activity  Physical Activity Interventions Other (Comments)  [Encourage physical activity]       CCM Care Plan  Allergies  Allergen Reactions   Ferumoxytol Swelling    Swelling in neck   Iron    Other     Other reaction(s): Other (See Comments) She refuses blood products-Jehovah witness   Penicillins Rash,  Swelling and Hives    Medications Reviewed Today     Reviewed by Vella Raring, RPH-CPP (Pharmacist) on 10/30/20 at 1740  Med List Status: <None>   Medication Order Taking? Sig Documenting Provider Last Dose Status Informant  ACCU-CHEK GUIDE test strip [id number]  Use to check blood sugar twice daily [name], DO  Active   Accu-Chek Softclix Lancets lancets [id number]  Use to check blood sugar twice daily [name], DO  Active   acetaminophen (TYLENOL) 500 MG tablet [id number] Yes Take 1,000 mg by mouth every 6 (six) hours as needed for moderate pain.  [provider] Taking Active Child  Artificial Tear Solution (GENTEAL TEARS) 0.1-0.2-0.3 % SOLN [id number] Yes Place 1 drop into both eyes 3 (three) times daily as needed (dry/irritated eyes.). [provider] Taking Active Child  aspirin 81 MG chewable tablet [id number] Yes Chew 1 tablet (81 mg total) by mouth daily. Eileen Stanford, PA-C Taking Active Child  B COMPLEX-C PO [id number] Yes Take 10 mLs by mouth daily. [provider] Taking Active Child  baclofen (LIORESAL) 10 MG tablet [id number] Yes TAKE 1 TO 2 TABLETS BY MOUTH TWICE DAILY AS NEEDED FOR MUSCLE SPASM Parks Ranger, Devonne Doughty, DO Taking Active Child  Blood Glucose Monitoring Suppl (ACCU-CHEK GUIDE) w/Device KIT [id number]  Use to check blood sugar twice daily [name], DO  Active   cephALEXin (KEFLEX) 500 MG capsule [id number] Yes TAKE FOUR CAPSULES BY MOUTH ONE HOUR BEFORE ANY DENTAL PROCEDURES OR CLEANINGS Eileen Stanford, PA-C Taking Active   Cholecalciferol (VITAMIN D3) 125 MCG (5000 UT) TABS [id number] Yes Take 5,000 Units by mouth daily. [provider] Taking Active Child  Patient not taking:  Discontinued 10/30/20 1740 (Completed Course)     Discontinued 09/05/19 1316   Ferrous Sulfate (IRON) 325 (65 Fe) MG TABS [id number] Yes TAKE 1 TABLET BY MOUTH TWICE DAILY WITH A MEAL [name],  Devonne Doughty, DO Taking Active Child  furosemide (LASIX) 20 MG tablet [id number] Yes Take 1 tablet (20 mg total) by mouth daily as needed for edema. End, Harrell Gave, MD Taking Active Child  hydrochlorothiazide (HYDRODIURIL) 25 MG tablet [id number] Yes Take 1 tablet (25 mg total) by mouth daily. End, Harrell Gave, MD Taking Active   lactobacillus acidophilus (BACID) TABS tablet [id number] No Take 2 tablets by mouth 2 (two) times daily as needed (digestive health.).   Patient not taking: Reported on 10/30/2020   [provider] Not Taking Active Child           Med Note Vianne Bulls Nov 03, 2019  3:06 PM)    levocetirizine (XYZAL) 5 MG tablet [id number] Yes TAKE 1 TABLET BY MOUTH ONCE DAILY IN THE EVENING [name], Devonne Doughty, DO Taking Active   losartan (COZAAR) 50 MG tablet [id number] Yes Take 1 tablet (50 mg total) by mouth daily. End, Harrell Gave, MD Taking Active   magnesium oxide (MAG-OX) 400 MG tablet [id number] Yes Take 400  mg by mouth daily. [provider] Taking Active   [facility] injection [id number]  Inject 0.5 mL into muscle for first dose, then repeat in 2-6 months. [name], DO  Active   simvastatin (ZOCOR) 20 MG tablet [id number] Yes TAKE 1 TABLET BY MOUTH ONCE DAILY AT  6  PM [name], Devonne Doughty, DO Taking Active   TRULICITY 9.38 BO/1.7PZ SOPN [id number] Yes Inject 0.75 mg into the skin once a week. [name], DO Taking Active             Patient Active Problem List   Diagnosis Date Noted   S/P TAVR (transcatheter aortic valve replacement) 11/09/2019   Patient is [name]    Severe aortic stenosis    Thyroid disease    Chronic heart failure with preserved ejection fraction (HFpEF) ([place]) 09/30/2019   Normocytic anemia 02/17/2019   Morbid obesity ([place]) 11/05/2017   Neutropenia ([place]) 04/24/2017   Vitamin D deficiency 10/08/2016   Meniere's disease of right ear 07/04/2016   TIA (transient ischemic attack)  07/04/2016   Nonrheumatic aortic valve stenosis 07/04/2016   Seasonal allergies 01/30/2016   Recurrent UTI 10/26/2015   Iron deficiency anemia due to chronic blood loss 02/28/2015   History of adenomatous polyp of colon 02/13/2015   Osteoarthritis of both knees 12/05/2014   Acid reflux 12/05/2014   Benign neoplasm of colon 12/05/2014   Type 2 diabetes mellitus with other specified complication ([place]) 24/23/5361   Essential hypertension 12/05/2014   Hyperlipidemia associated with type 2 diabetes mellitus ([place]) 12/05/2014   Arthritis, degenerative 12/05/2014   Anemia due to multiple mechanisms 12/05/2014   Abnormal liver enzymes 12/05/2014    Immunization History  Administered Date(s) Administered   Fluad Quad(high Dose 65+) 01/12/2019, 02/11/2020   Influenza, High Dose Seasonal PF 02/09/2014, 01/05/2015, 01/30/2016, 01/14/2017   Influenza,inj,Quad PF,6+ Mos 04/05/2013   Influenza-Unspecified 02/03/2014, 02/03/2018   Moderna Sars-Covid-2 Vaccination 09/25/2019, 10/23/2019   Pneumococcal Conjugate-13 08/05/2014   Pneumococcal Polysaccharide-23 04/05/2013    Conditions to be addressed/monitored: T2DM, HLD, HTN, HFpEF  Care Plan : PharmD - Medication Assistance/Management  Updates made by Vella Raring, RPH-CPP since 10/30/2020 12:00 AM     Problem: Disease Progression      Long-Range Goal: Disease Progression Prevented or Minimized   Start Date: 10/30/2020  Expected End Date: 01/28/2021  This Visit's Progress: On track  Priority: High  Note:   Current Barriers:  Unable to independently afford treatment regimen  Pharmacist Clinical Goal(s):  Over the next 90 days, patient will verbalize ability to afford treatment regimen through collaboration with PharmD and provider.   Interventions: 1:1 collaboration with [name], DO regarding development and update of comprehensive plan of care as evidenced by provider attestation and  co-signature Inter-disciplinary care team collaboration (see longitudinal plan of care) Perform chart review Office Visit with [facility] Neurology on 6/1 Office Visit with PCP on 3/21 Referred to Neurology for evaluation of Left hand history of carpal tunnel surgery years ago Patient provided with Rx for Shingrix shingles vaccine 2 dose series Comprehensive medication review performed; medication list updated in electronic medical record Reports started taking magnesium oxide 400 mg daily as recommended by Neurology Reports that she is no longer taking clopidogrel, as completed course as directed by Angelena Form, PA-C with Heart Valve team. From review of chart, note: Patient started on clopidogrel (Plavix) and s/p TAVR (11/09/2019) On 12/21/2019 provider advised patient "you will stop plavix after 6 months of therapy when your  second bottle of plavix runs out". Encourage patient to follow up with pharmacy for scheduling of vaccination as reports has not yet received Shingrix vaccination  Medication Assistance: Reports copayment for Trulicity is unaffordable since in coverage gap of Medicare Part D Plan Reports previously received extra help subsidy, but no longer eligible Based on reported income, confirm patient not eligible for extra help subsidy Based on reported income, patient does meet criteria for Trulicity patient assistance program through [employer] with [place] [facility] for aid to patient with completing patient assistance application  E7MR: Current treatment: Trulicity 6.15 mg weekly on Saturdays Reports recent home blood sugars ranging 120s-140s Counsel on impact of diet and exercise on blood sugar Encourage patient to limit carbohydrate portion sizes Reports exercise limited by arthritis. Reports doing chair and hand exercises at home Reports misses going to the pool, but stopped going due to COVID-19 pandemic Reports currently has 2 doses of  Trulicity remaining. Will collaborate with office to determine if Trulicity sample is available for patient to pick up  HTN: Current treatment: HCTZ 25 mg daily Losartan 50 mg daily Furosemide 20 mg daily as needed for edema Reports recent home BP readings ranging 120s-130s/50s-60s Encourage patient to monitor daily weights and follow direction from Cardiology for taking furosemide as needed  COVID-19 Prevention: Reports received Moderna COVID-19 booster dose on 12/19 at [facility] Encourage patient to obtain second booster dose  Patient Goals/Self-Care Activities Over the next 90 days, patient will:  - take medications as prescribed - check glucose, document, and provide at future appointments - check blood pressure, document, and provide at future appointments - collaborate with provider on medication access solutions  Follow Up Plan: Telephone follow up appointment with care management team member scheduled for: 7/25 at 2:30pm      Patient's preferred pharmacy is:  [facility] [address], [place] - [place] [address] Phone: [phone] Fax: [phone]  [email or web address] - [place], [place] - [phone] [facility] [address] [facility] [address] [place] [address] Phone: [phone] Fax: [phone]   Follow Up:  Patient agrees to Care Plan and Follow-up.  Harlow Asa, PharmD, [place], CPP Clinical Pharmacist [facility] [phone]

## 2020-11-01 ENCOUNTER — Ambulatory Visit: Payer: Self-pay | Admitting: Pharmacist

## 2020-11-01 DIAGNOSIS — E1169 Type 2 diabetes mellitus with other specified complication: Secondary | ICD-10-CM

## 2020-11-01 NOTE — Patient Instructions (Signed)
Visit Information  PATIENT GOALS:  Goals Addressed             This Visit's Progress    Pharmacy Goals       Our goal A1c is less than 7%. This corresponds with fasting sugars less than 130 and 2 hour after meal sugars less than 180. Please keep a log of your results when checking your blood sugar  Our goal bad cholesterol, or LDL, is less than 70 . This is why it is important to continue taking your simvastatin  Please check your home blood pressure, keep a log of the results and bring this with you to your medical appointments.  Feel free to call me with any questions or concerns. I look forward to our next call!  Harlow Asa, PharmD, Para [place], CPP Clinical Pharmacist [facility] [phone]          The patient verbalized understanding of instructions, educational materials, and care plan provided today and declined offer to receive copy of patient instructions, educational materials, and care plan.   Telephone follow up appointment with care management team member scheduled for: 7/25 at 2:30pm

## 2020-11-01 NOTE — Chronic Care Management (AMB) (Signed)
Chronic Care Management Pharmacy Note  11/01/2020 Name:  Diana Harmon MRN:  [id number] DOB:  [birth year]   Subjective: Lelar Farewell Hilger is an 75 y.o. year old female who is a primary patient of [name], DO.  The CCM team was consulted for assistance with disease management and care coordination needs.    Engaged with patient by telephone for follow up visit in response to provider referral for pharmacy case management and/or care coordination services.   Consent to Services:  The patient was given information about Chronic Care Management services, agreed to services, and gave verbal consent prior to initiation of services.  Please see initial visit note for detailed documentation.   Patient Care Team: [name], DO as PCP - General (Family Medicine) End, Harrell Gave, MD as PCP - Cardiology (Cardiology) Cammie Sickle, MD as Consulting Physician (Internal Medicine) Lorelee Cover., MD (Ophthalmology) End, Harrell Gave, MD as Consulting Physician (Cardiology) Vanita Ingles, RN as Case Manager ([place]) Cammie Sickle, MD as Consulting Physician (Internal Medicine)  Recent office visits: None  Hospital visits: None in previous 6 months  Objective:  Lab Results  Component Value Date   CREATININE 1.28 (H) 07/13/2020   CREATININE 1.36 (H) 05/03/2020   CREATININE 1.19 (H) 01/14/2020    Lab Results  Component Value Date   HGBA1C 6.9 (A) 07/24/2020   Last diabetic Eye exam:  Lab Results  Component Value Date/Time   HMDIABEYEEXA No Retinopathy 04/18/2019 12:00 AM    Last diabetic Foot exam: No results found for: HMDIABFOOTEX    Social History   Tobacco Use  Smoking Status Former   Years: 20.00   Pack years: 0.00   Types: Cigarettes   Quit date: 09/05/1979   Years since quitting: 41.1  Smokeless Tobacco Former   BP Readings from Last 3 Encounters:  09/25/20 (!) 125/58  07/24/20 (!) 133/57   07/13/20 (!) 145/62   Pulse Readings from Last 3 Encounters:  07/24/20 81  07/13/20 78  05/03/20 93   Wt Readings from Last 3 Encounters:  09/23/20 231 lb (104.8 kg)  07/24/20 229 lb 12.8 oz (104.2 kg)  07/13/20 223 lb (101.2 kg)    Assessment: Review of patient past medical history, allergies, medications, health status, including review of consultants reports, laboratory and other test data, was performed as part of comprehensive evaluation and provision of chronic care management services.   SDOH:  (Social Determinants of Health) assessments and interventions performed: none   CCM Care Plan  Allergies  Allergen Reactions   Ferumoxytol Swelling    Swelling in neck   Iron    Other     Other reaction(s): Other (See Comments) She refuses blood products-Jehovah witness   Penicillins Rash, Swelling and Hives    Medications Reviewed Today     Reviewed by Vella Raring, RPH-CPP (Pharmacist) on 10/30/20 at 1740  Med List Status: <None>   Medication Order Taking? Sig Documenting Provider Last Dose Status Informant  ACCU-CHEK GUIDE test strip [id number]  Use to check blood sugar twice daily [name], DO  Active   Accu-Chek Softclix Lancets lancets [id number]  Use to check blood sugar twice daily [name], DO  Active   acetaminophen (TYLENOL) 500 MG tablet [id number] Yes Take 1,000 mg by mouth every 6 (six) hours as needed for moderate pain.  [provider] Taking Active Child  Artificial Tear Solution (GENTEAL TEARS) 0.1-0.2-0.3 % SOLN [id number] Yes Place 1 drop into  both eyes 3 (three) times daily as needed (dry/irritated eyes.). [provider] Taking Active Child  aspirin 81 MG chewable tablet [id number] Yes Chew 1 tablet (81 mg total) by mouth daily. Eileen Stanford, PA-C Taking Active Child  B COMPLEX-C PO [id number] Yes Take 10 mLs by mouth daily. [provider] Taking Active Child  baclofen (LIORESAL) 10 MG  tablet [id number] Yes TAKE 1 TO 2 TABLETS BY MOUTH TWICE DAILY AS NEEDED FOR MUSCLE SPASM Parks Ranger, Devonne Doughty, DO Taking Active Child  Blood Glucose Monitoring Suppl (ACCU-CHEK GUIDE) w/Device KIT [id number]  Use to check blood sugar twice daily [name], DO  Active   cephALEXin (KEFLEX) 500 MG capsule [id number] Yes TAKE FOUR CAPSULES BY MOUTH ONE HOUR BEFORE ANY DENTAL PROCEDURES OR CLEANINGS Eileen Stanford, PA-C Taking Active   Cholecalciferol (VITAMIN D3) 125 MCG (5000 UT) TABS [id number] Yes Take 5,000 Units by mouth daily. [provider] Taking Active Child  Patient not taking:  Discontinued 10/30/20 1740 (Completed Course)     Discontinued 09/05/19 1316   Ferrous Sulfate (IRON) 325 (65 Fe) MG TABS [id number] Yes TAKE 1 TABLET BY MOUTH TWICE DAILY WITH A MEAL [name], Devonne Doughty, DO Taking Active Child  furosemide (LASIX) 20 MG tablet [id number] Yes Take 1 tablet (20 mg total) by mouth daily as needed for edema. End, Harrell Gave, MD Taking Active Child  hydrochlorothiazide (HYDRODIURIL) 25 MG tablet [id number] Yes Take 1 tablet (25 mg total) by mouth daily. End, Harrell Gave, MD Taking Active   lactobacillus acidophilus (BACID) TABS tablet [id number] No Take 2 tablets by mouth 2 (two) times daily as needed (digestive health.).   Patient not taking: Reported on 10/30/2020   [provider] Not Taking Active Child           Med Note Vianne Bulls Nov 03, 2019  3:06 PM)    levocetirizine (XYZAL) 5 MG tablet [id number] Yes TAKE 1 TABLET BY MOUTH ONCE DAILY IN THE EVENING [name], Devonne Doughty, DO Taking Active   losartan (COZAAR) 50 MG tablet [id number] Yes Take 1 tablet (50 mg total) by mouth daily. End, Harrell Gave, MD Taking Active   magnesium oxide (MAG-OX) 400 MG tablet [id number] Yes Take 400 mg by mouth daily. [provider] Taking Active   [facility] injection [id number]  Inject 0.5 mL into muscle for first dose, then repeat in 2-6  months. [name], DO  Active   simvastatin (ZOCOR) 20 MG tablet [id number] Yes TAKE 1 TABLET BY MOUTH ONCE DAILY AT  6  PM [name], Devonne Doughty, DO Taking Active   TRULICITY 1.42 LT/5.3UY SOPN [id number] Yes Inject 0.75 mg into the skin once a week. [name], DO Taking Active             Patient Active Problem List   Diagnosis Date Noted   S/P TAVR (transcatheter aortic valve replacement) 11/09/2019   Patient is [name]    Severe aortic stenosis    Thyroid disease    Chronic heart failure with preserved ejection fraction (HFpEF) ([place]) 09/30/2019   Normocytic anemia 02/17/2019   Morbid obesity ([place]) 11/05/2017   Neutropenia ([place]) 04/24/2017   Vitamin D deficiency 10/08/2016   Meniere's disease of right ear 07/04/2016   TIA (transient ischemic attack) 07/04/2016   Nonrheumatic aortic valve stenosis 07/04/2016   Seasonal allergies 01/30/2016   Recurrent UTI 10/26/2015   Iron deficiency anemia due to chronic blood loss 02/28/2015   History of adenomatous polyp of  colon 02/13/2015   Osteoarthritis of both knees 12/05/2014   Acid reflux 12/05/2014   Benign neoplasm of colon 12/05/2014   Type 2 diabetes mellitus with other specified complication ([place]) 29/52/8413   Essential hypertension 12/05/2014   Hyperlipidemia associated with type 2 diabetes mellitus ([place]) 12/05/2014   Arthritis, degenerative 12/05/2014   Anemia due to multiple mechanisms 12/05/2014   Abnormal liver enzymes 12/05/2014    Immunization History  Administered Date(s) Administered   Fluad Quad(high Dose 65+) 01/12/2019, 02/11/2020   Influenza, High Dose Seasonal PF 02/09/2014, 01/05/2015, 01/30/2016, 01/14/2017   Influenza,inj,Quad PF,6+ Mos 04/05/2013   Influenza-Unspecified 02/03/2014, 02/03/2018   Moderna Sars-Covid-2 Vaccination 09/25/2019, 10/23/2019   Pneumococcal Conjugate-13 08/05/2014   Pneumococcal Polysaccharide-23 04/05/2013    Conditions to be  addressed/monitored: T2DM, HLD, HTN, HFpEF  Care Plan : PharmD - Medication Assistance/Management  Updates made by Vella Raring, RPH-CPP since 11/01/2020 12:00 AM     Problem: Disease Progression      Long-Range Goal: Disease Progression Prevented or Minimized   Start Date: 10/30/2020  Expected End Date: 01/28/2021  This Visit's Progress: On track  Recent Progress: On track  Priority: High  Note:   Current Barriers:  Unable to independently afford treatment regimen  Pharmacist Clinical Goal(s):  Over the next 90 days, patient will verbalize ability to afford treatment regimen through collaboration with PharmD and provider.   Interventions: 1:1 collaboration with [name], DO regarding development and update of comprehensive plan of care as evidenced by provider attestation and co-signature Inter-disciplinary care team collaboration (see longitudinal plan of care)  Medication Assistance: Reports copayment for Trulicity is unaffordable since in coverage gap of Medicare Part D Plan Will continue to collaborate with [place] [employer] for aid to patient with completing patient assistance application Note patient has only 2 week supply remaining at home. Collaborate with PCP/office - confirm sample of 1 pen (2 week supply) Trulicity 2.44 mg available for patient Outreach to patient who confirms that she or her daughter will come by office to pick up  Patient Goals/Self-Care Activities Over the next 90 days, patient will:  - take medications as prescribed - check glucose, document, and provide at future appointments - check blood pressure, document, and provide at future appointments - collaborate with provider on medication access solutions  Follow Up Plan: Telephone follow up appointment with care management team member scheduled for: 7/25 at 2:30pm      Patient's preferred pharmacy is:  [facility] [address], [place] - [place] [address] Phone: [phone] Fax: [phone]  [email or web address] - [place], [place] - [phone] [facility] [address] [facility] [address] [place] [address] Phone: [phone] Fax: [phone]    Follow Up:  Patient agrees to Care Plan and Follow-up.  Harlow Asa, PharmD, Para [place], CPP Clinical Pharmacist [facility] [phone]

## 2020-11-02 ENCOUNTER — Telehealth: Payer: Self-pay | Admitting: Pharmacy Technician

## 2020-11-02 DIAGNOSIS — Z596 Low income: Secondary | ICD-10-CM

## 2020-11-02 NOTE — Progress Notes (Signed)
Diana Harmon)                                            [place] [facility]    11/02/2020  Diana Harmon Sep 27, 1945 [id number]                                      Medication Assistance Referral  Referral From: [facility] Embedded RPh Diana Harmon   Medication/Company: Diana Harmon / Diana Harmon Patient application portion:  Diana Harmon Provider application portion: Diana Harmon  to Diana Harmon Provider address/fax verified via: Office website   Diana Harmon Diana Harmon, Diana Harmon  [phone]

## 2020-11-05 ENCOUNTER — Other Ambulatory Visit: Payer: Self-pay | Admitting: Family Medicine

## 2020-11-05 DIAGNOSIS — E1169 Type 2 diabetes mellitus with other specified complication: Secondary | ICD-10-CM

## 2020-11-05 NOTE — Telephone Encounter (Signed)
last RF 10/10/20 4 ml ///too soon

## 2020-11-08 DIAGNOSIS — R200 Anesthesia of skin: Secondary | ICD-10-CM | POA: Diagnosis not present

## 2020-11-09 ENCOUNTER — Ambulatory Visit (HOSPITAL_COMMUNITY): Payer: Medicare Other | Attending: Cardiovascular Disease

## 2020-11-09 ENCOUNTER — Other Ambulatory Visit: Payer: Self-pay

## 2020-11-09 ENCOUNTER — Ambulatory Visit: Payer: Medicare Other | Admitting: Physician Assistant

## 2020-11-09 ENCOUNTER — Other Ambulatory Visit: Payer: Self-pay | Admitting: Family Medicine

## 2020-11-09 ENCOUNTER — Encounter: Payer: Self-pay | Admitting: Physician Assistant

## 2020-11-09 VITALS — BP 120/68 | HR 78 | Ht 60.0 in | Wt 230.8 lb

## 2020-11-09 DIAGNOSIS — E1169 Type 2 diabetes mellitus with other specified complication: Secondary | ICD-10-CM

## 2020-11-09 DIAGNOSIS — Z952 Presence of prosthetic heart valve: Secondary | ICD-10-CM

## 2020-11-09 DIAGNOSIS — D6489 Other specified anemias: Secondary | ICD-10-CM | POA: Diagnosis not present

## 2020-11-09 DIAGNOSIS — I10 Essential (primary) hypertension: Secondary | ICD-10-CM

## 2020-11-09 LAB — ECHOCARDIOGRAM COMPLETE
AR max vel: 1.81 cm2
AV Area VTI: 1.88 cm2
AV Area mean vel: 1.77 cm2
AV Mean grad: 13.5 mmHg
AV Peak grad: 24.7 mmHg
Ao pk vel: 2.49 m/s
Area-P 1/2: 3.68 cm2
S' Lateral: 2.4 cm

## 2020-11-09 MED ORDER — TRULICITY 0.75 MG/0.5ML ~~LOC~~ SOAJ: 0.7500 mg | SUBCUTANEOUS | 1 refills | Status: DC

## 2020-11-09 NOTE — Progress Notes (Signed)
[facility]                                       Cardiology Office Note    Date:  11/10/2020   ID:  Diana Harmon, DOB 03/19/1946, MRN [id number]  PCP:  Diana Hauser, DO  Cardiologist: Dr. Saunders Harmon / Dr. Angelena Harmon & Dr. Cyndia Harmon (TAVR)  CC: 1 year s/p TAVR  History of Present Illness:  Diana Harmon is a 75 y.o. female with a history of chronic systolic CHF, RBBB, obesity, HTN, HLD, diabetes mellitus, thyroid disease, anemia on chronic iron infusions, and severe aortic stenosis s/p TAVR (11/09/19) who presents to clinic for follow up.   Pt has a history of aortic stenosis that progressed to severe last year. Echocardiogram on 08/19/2019 showed an increase in the mean gradient to 32 mmHg with a peak gradient of 75 mmHg.  Aortic valve area had decreased to 0.85 cm consistent with severe aortic stenosis.  Left ventricular ejection fraction is 60 to 65%.  She underwent cardiac catheterization on 10/19/2019 showing no evidence of coronary disease.  The mean gradient by catheterization was 29 mmHg with an aortic valve area of 0.8 cm.   She was evaluated by the multidisciplinary valve team and underwent successful TAVR with a 26 mm Edwards Sapien 3 Ultra THV via the TF approach on 11/09/19. Post operative echo showed EF 65%, normally functioning TAVR with a mean gradient of 18 mm hg and no PVL. She was discharged on aspirin and palvix.   She was then readmitted from 7/7-7/10/21 for fever with SIRS. Primary source unknown. Work up was negative and she was treated for a UTI. 1 mont echo showed EF 65%, normally functioning TAVR with a mean gradient of 13 mmHg (improved from 18 mm hg) and no PVL.  Today she presents to clinic for follow up. Here with daughter. Occasional LE edeema for which she takes PRN lasix. Otherwise doing quite well and very happy. No CP. She does have some exertional SOB and fatigue. She chronically sleeps  propped up. No dizziness or syncope. No blood in stool or urine. No palpitations. The most exercise she gets is walking around the house.   Past Medical History:  Diagnosis Date   Anemia    Arthritis    legs, knees, back, wrists   Diabetes mellitus without complication (HCC)    Hypertension    Nausea    Patient is [name]    S/P TAVR (transcatheter aortic valve replacement) 11/09/2019   s/p TAVR with a 26 mm Edwards S3U via the TF approach by Dr. Angelena Harmon & Bartle   Severe aortic stenosis    SIRS (systemic inflammatory response syndrome) ([place]) 11/2019   Thyroid disease     Past Surgical History:  Procedure Laterality Date   CARDIAC CATHETERIZATION  10/19/2019   COLON SURGERY     caudarized polyps; rectal carcinoid excision ~ 2011   COLONOSCOPY     COLONOSCOPY WITH PROPOFOL N/A 03/13/2015   Procedure: COLONOSCOPY WITH PROPOFOL;  Surgeon: Diana Silvas, MD;  Location: [place];  Service: Endoscopy;  Laterality: N/A;   ESOPHAGOGASTRODUODENOSCOPY N/A 03/13/2015   Procedure: ESOPHAGOGASTRODUODENOSCOPY (EGD);  Surgeon: Diana Silvas, MD;  Location[facility] ENDOSCOPY;  Service: Endoscopy;  Laterality: N/A;   INTRAOPERATIVE TRANSTHORACIC ECHOCARDIOGRAM N/A 11/09/2019   Procedure: INTRAOPERATIVE TRANSTHORACIC ECHOCARDIOGRAM;  Surgeon: Diana Harmon,  Diana Brod, MD;  Location: [place];  Service: Open Heart Surgery;  Laterality: N/A;   PARATHYROIDECTOMY  11/20/2004   right superior parathyroidecomy for primary hyperparathyroidism/parathyroid adenoma   RIGHT/LEFT HEART CATH AND CORONARY ANGIOGRAPHY N/A 10/19/2019   Procedure: RIGHT/LEFT HEART CATH AND CORONARY ANGIOGRAPHY;  Surgeon: Diana Bush, MD;  Location: [facility];  Service: Cardiovascular;  Laterality: N/A;   THYROIDECTOMY     TRANSCATHETER AORTIC VALVE REPLACEMENT, TRANSFEMORAL N/A 11/09/2019   Procedure: TRANSCATHETER AORTIC VALVE REPLACEMENT, TRANSFEMORAL;  Surgeon: Diana Blanks, MD;  Location: [facility];  Service: Open Heart Surgery;  Laterality: N/A;    Current Medications: Outpatient Medications Prior to Visit  Medication Sig Dispense Refill   ACCU-CHEK GUIDE test strip Use to check blood sugar twice daily 200 each 12   Accu-Chek Softclix Lancets lancets Use to check blood sugar twice daily 200 each 12   acetaminophen (TYLENOL) 500 MG tablet Take 1,000 mg by mouth every 6 (six) hours as needed for moderate pain.      Artificial Tear Solution (GENTEAL TEARS) 0.1-0.2-0.3 % SOLN Place 1 drop into both eyes 3 (three) times daily as needed (dry/irritated eyes.).     aspirin 81 MG chewable tablet Chew 1 tablet (81 mg total) by mouth daily.     B COMPLEX-C PO Take 10 mLs by mouth daily.     baclofen (LIORESAL) 10 MG tablet TAKE 1 TO 2 TABLETS BY MOUTH TWICE DAILY AS NEEDED FOR MUSCLE SPASM 60 tablet 2   Blood Glucose Monitoring Suppl (ACCU-CHEK GUIDE) w/Device KIT Use to check blood sugar twice daily 1 kit 0   cephALEXin (KEFLEX) 500 MG capsule TAKE FOUR CAPSULES BY MOUTH ONE HOUR BEFORE ANY DENTAL PROCEDURES OR CLEANINGS 8 capsule 0   Cholecalciferol (VITAMIN D3) 125 MCG (5000 UT) TABS Take 5,000 Units by mouth daily.     Ferrous Sulfate (IRON) 325 (65 Fe) MG TABS TAKE 1 TABLET BY MOUTH TWICE DAILY WITH A MEAL 180 each 1   furosemide (LASIX) 20 MG tablet Take 1 tablet (20 mg total) by mouth daily as needed for edema. 45 tablet 6   hydrochlorothiazide (HYDRODIURIL) 25 MG tablet Take 1 tablet (25 mg total) by mouth daily. 90 tablet 3   lactobacillus acidophilus (BACID) TABS tablet Take 2 tablets by mouth 2 (two) times daily as needed (digestive health.).     levocetirizine (XYZAL) 5 MG tablet TAKE 1 TABLET BY MOUTH ONCE DAILY IN THE EVENING 90 tablet 3   losartan (COZAAR) 50 MG tablet Take 1 tablet (50 mg total) by mouth daily. 90 tablet 3   magnesium oxide (MAG-OX) 400 MG tablet Take 400 mg by mouth daily.     SHINGRIX injection Inject 0.5 mL into muscle for first dose, then repeat in 2-6  months. 0.5 mL 1   simvastatin (ZOCOR) 20 MG tablet TAKE 1 TABLET BY MOUTH ONCE DAILY AT  6  PM 90 tablet 3   TRULICITY 3.97 QB/3.4LP SOPN Inject 0.75 mg into the skin once a week. 12 mL 1   No facility-administered medications prior to visit.     Allergies:   Ferumoxytol, Iron, Other, and Penicillins   Social History   Socioeconomic History   Marital status: Divorced    Spouse name: Not on file   Number of children: 2   Years of education: 12   Highest education level: 12th grade  Occupational History   Occupation: retired-worked with nandicapped adults  Tobacco Use   Smoking status: Former  Years: 20.00    Pack years: 0.00    Types: Cigarettes    Quit date: 09/05/1979    Years since quitting: 41.2   Smokeless tobacco: Former  Scientific laboratory technician Use: Never used  Substance and Sexual Activity   Alcohol use: Yes    Alcohol/week: 0.0 - 1.0 standard drinks    Comment: occ. wine   Drug use: No   Sexual activity: Not on file  Other Topics Concern   Not on file  Social History Narrative   Not on file   Social Determinants of Health   Financial Resource Strain: Low Risk    Difficulty of Paying Living Expenses: Not hard at all  Food Insecurity: No Food Insecurity   Worried About Charity fundraiser in the Last Year: Never true   [place] in the Last Year: Never true  Transportation Needs: No Transportation Needs   Lack of Transportation (Medical): No   Lack of Transportation (Non-Medical): No  Physical Activity: Insufficiently Active   Days of Exercise per Week: 2 days   Minutes of Exercise per Session: 20 min  Stress: No Stress Concern Present   Feeling of Stress : Not at all  Social Connections: Not on file     Family History:  The patient's family history includes Aneurysm in her son; Breast cancer in her maternal aunt; Heart disease in her father; Lupus in her sister and son; Parkinson's disease in her mother.     ROS:   Please see the history of  present illness.    ROS All other systems reviewed and are negative.   PHYSICAL EXAM:   VS:  BP 120/68   Pulse 78   Ht 5' (1.524 m)   Wt 230 lb 12.8 oz (104.7 kg)   SpO2 95%   BMI 45.08 kg/m    GEN: Well nourished, well developed, in no acute distress, obese HEENT: normal Neck: no JVD or masses Cardiac: RRR; no murmurs, rubs, or gallops,no edema  Respiratory:  clear to auscultation bilaterally, normal work of breathing GI: soft, nontender, nondistended, + BS MS: no deformity or atrophy Skin: warm and dry, no rash .  Neuro:  Alert and Oriented x 3, Strength and sensation are intact Psych: euthymic mood, full affect   Wt Readings from Last 3 Encounters:  11/09/20 230 lb 12.8 oz (104.7 kg)  09/23/20 231 lb (104.8 kg)  07/24/20 229 lb 12.8 oz (104.2 kg)      Studies/Labs Reviewed:   EKG:  EKG is NOT ordered today.    Recent Labs: 11/12/2019: Magnesium 2.0 05/03/2020: ALT 26 07/13/2020: BUN 42; Creatinine, Ser 1.28; Hemoglobin 10.2; Platelets 156; Potassium 4.3; Sodium 141   Lipid Panel    Component Value Date/Time   CHOL 170 05/03/2020 0935   TRIG 103 05/03/2020 0935   HDL 54 05/03/2020 0935   CHOLHDL 3.1 05/03/2020 0935   CHOLHDL 2.4 07/21/2017 0000   VLDL 26 06/03/2016 0001   LDLCALC 97 05/03/2020 0935   LDLCALC 77 07/21/2017 0000    Additional studies/ records that were reviewed today include:  TAVR OPERATIVE NOTE     Date of Procedure:                11/09/2019   Preoperative Diagnosis:      Severe Aortic Stenosis    Postoperative Diagnosis:    Same    Procedure:        Transcatheter Aortic Valve Replacement - Percutaneous Right Transfemoral  Approach             Edwards Sapien 3 Ultra THV (size 26 mm, model # 9750TFX, serial # [id number])              Co-Surgeons:                        Gaye Pollack, MD  /  Lauree Chandler, MD     Anesthesiologist:                  Perfecto Kingdom, MD   Echocardiographer:              Liane Comber, MD    Pre-operative Echo Findings: Severe aortic stenosis Normal left ventricular systolic function   Post-operative Echo Findings: NO paravalvular leak Normal left ventricular systolic function   _____________   Echo 11/10/19:  IMPRESSIONS   1. Day 1 post TAVR, no paravalvular leak. Upper normal transaortic  gradients with peak/mean 37/18 mmHg.   2. Left ventricular ejection fraction, by estimation, is 65 to 70%. The  left ventricle has normal function. The left ventricle has no regional  wall motion abnormalities. Left ventricular diastolic parameters are  consistent with Grade I diastolic dysfunction (impaired relaxation).   3. Right ventricular systolic function is normal. The right ventricular  size is normal. There is normal pulmonary artery systolic pressure. The  estimated right ventricular systolic pressure is 87.5 mmHg.   4. The mitral valve is normal in structure. No evidence of mitral valve  regurgitation. No evidence of mitral stenosis.   5. The aortic valve has been repaired/replaced. Aortic valve  regurgitation is not visualized. No aortic stenosis is present. Procedure  Date: 11/09/2019. Echo findings are consistent with normal structure and  function of the aortic valve prosthesis. Aortic   valve mean gradient measures 18.0 mmHg.   6. The inferior vena cava is normal in size with greater than 50%  respiratory variability, suggesting right atrial pressure of 3 mmHg.  ____________________   Echo 12/20/19 IMPRESSIONS   1. Left ventricular ejection fraction, by estimation, is 65 to 70%. The  left ventricle has normal function. The left ventricle has no regional  wall motion abnormalities. There is moderate left ventricular hypertrophy.  Left ventricular diastolic  parameters are consistent with Grade I diastolic dysfunction (impaired  relaxation).   2. Right ventricular systolic function is normal. The right ventricular  size is normal. There is normal pulmonary artery  systolic pressure.   3. The mitral valve is abnormal. Trivial mitral valve regurgitation.   4. The aortic valve has been repaired/replaced. Aortic valve  regurgitation is not visualized. There is a 26 mm Edwards Edwards Sapien  prosthetic (TAVR) valve present in the aortic position. Procedure Date:  11/09/19. Echo findings are consistent with  normal structure and function of the aortic valve prosthesis. Aortic valve  area, by VTI measures 1.41 cm. Aortic valve mean gradient measures 13.0  mmHg. Aortic valve Vmax measures 2.42 m/s.   5. The inferior vena cava is normal in size with greater than 50%  respiratory variability, suggesting right atrial pressure of 3 mmHg.   Comparison(s): Changes from prior study are noted. 11/10/19: 65-70%, AOV  mean gradient 18 mmHg.   ____________________  Echo 11/09/20 IMPRESSIONS   1. 26 mm S3 (11/09/2019). V max 2.5 m/s, MG 14 mmHG, EOA 1.88 cm2, DI 0.45. No regurgitation or PVL. Normal prosthesis. The aortic valve has been repaired/replaced. Aortic valve regurgitation is  not visualized. There is  a 26 mm Sapien prosthetic (TAVR)  valve present in the aortic position. Procedure Date: 11/09/2019. Echo  findings are consistent with normal structure and function of the aortic  valve prosthesis.   2. Left ventricular ejection fraction, by estimation, is 60 to 65%. The  left ventricle has normal function. The left ventricle has no regional  wall motion abnormalities. There is mild asymmetric left ventricular  hypertrophy of the basal-septal segment.  Left ventricular diastolic parameters are consistent with Grade I  diastolic dysfunction (impaired relaxation).   3. Right ventricular systolic function is normal. The right ventricular  size is normal. There is normal pulmonary artery systolic pressure. The  estimated right ventricular systolic pressure is 37.1 mmHg.   4. The mitral valve is grossly normal. No evidence of mitral valve  regurgitation. No evidence  of mitral stenosis.   5. The inferior vena cava is normal in size with greater than 50%  respiratory variability, suggesting right atrial pressure of 3 mmHg.   Comparison(s): No significant change from prior study.   ASSESSMENT & PLAN:   Severe AS s/p TAVR: echo today shows EF 60%, normally functioning TAVR with a mean gradient of 14 mm hg and no PVL. She has NYHA class II symptoms. SBE prophylaxis discussed; she has keflex due to a non anaphylactic PCN allergy. Continue on aspirin alone   HTN: BP well controlled. No changes made.    Chronic anemia: [name]. Continue iron at home and iron infusions with Dr. Burlene Arnt.  Morbid obesity: Body mass index is 45.08 kg/m.   Medication Adjustments/Labs and Tests Ordered: Current medicines are reviewed at length with the patient today.  Concerns regarding medicines are outlined above.  Medication changes, Labs and Tests ordered today are listed in the Patient Instructions below. Patient Instructions  Medication Instructions:  none *If you need a refill on your cardiac medications before your next appointment, please call your pharmacy*   Lab Work: none If you have labs (blood work) drawn today and your tests are completely normal, you will receive your results only by: [place] (if you have MyChart) OR A paper copy in the mail If you have any lab test that is abnormal or we need to change your treatment, we will call you to review the results.   Testing/Procedures: none   Follow-Up: As planned with Dr. Saunders Harmon       Signed, Diana Form, PA-C  11/10/2020 8:27 AM    [address], [place], [place]  [address] Phone: [phone]; Fax: [phone]

## 2020-11-09 NOTE — Patient Instructions (Signed)
Medication Instructions:  none *If you need a refill on your cardiac medications before your next appointment, please call your pharmacy*   Lab Work: none If you have labs (blood work) drawn today and your tests are completely normal, you will receive your results only by: [place] (if you have MyChart) OR A paper copy in the mail If you have any lab test that is abnormal or we need to change your treatment, we will call you to review the results.   Testing/Procedures: none   Follow-Up: As planned with Dr. Saunders Revel

## 2020-11-20 ENCOUNTER — Ambulatory Visit (INDEPENDENT_AMBULATORY_CARE_PROVIDER_SITE_OTHER): Payer: Medicare Other | Admitting: Pharmacist

## 2020-11-20 DIAGNOSIS — E1169 Type 2 diabetes mellitus with other specified complication: Secondary | ICD-10-CM

## 2020-11-20 NOTE — Patient Instructions (Signed)
Visit Information  PATIENT GOALS:  Goals Addressed             This Visit's Progress    Pharmacy Goals       Our goal A1c is less than 7%. This corresponds with fasting sugars less than 130 and 2 hour after meal sugars less than 180. Please keep a log of your results when checking your blood sugar  Our goal bad cholesterol, or LDL, is less than 70 . This is why it is important to continue taking your simvastatin  Please check your home blood pressure, keep a log of the results and bring this with you to your medical appointments.  Feel free to call me with any questions or concerns. I look forward to our next call!    Harlow Asa, PharmD, [place], CPP Clinical Pharmacist [facility] [phone]         The patient verbalized understanding of instructions, educational materials, and care plan provided today and declined offer to receive copy of patient instructions, educational materials, and care plan.   Telephone follow up appointment with care management team member scheduled for: 12/11/2020 at 9:30 AM

## 2020-11-20 NOTE — Chronic Care Management (AMB) (Signed)
Chronic Care Management Pharmacy Note  11/20/2020 Name:  Diana Harmon MRN:  [id number] DOB:  [birth year]  Subjective: Diana Harmon is an 75 y.o. year old female who is a primary patient of [name], DO.  The CCM team was consulted for assistance with disease management and care coordination needs.    Engaged with patient by telephone for follow up visit in response to provider referral for pharmacy case management and/or care coordination services.   Consent to Services:  The patient was given information about Chronic Care Management services, agreed to services, and gave verbal consent prior to initiation of services.  Please see initial visit note for detailed documentation.   Patient Care Team: [name], DO as PCP - General (Family Medicine) End, Harrell Gave, MD as PCP - Cardiology (Cardiology) Cammie Sickle, MD as Consulting Physician (Internal Medicine) Lorelee Cover., MD (Ophthalmology) End, Harrell Gave, MD as Consulting Physician (Cardiology) Vanita Ingles, RN as Case Manager ([place]) Cammie Sickle, MD as Consulting Physician (Internal Medicine)  Recent consult visits: Office Visit with [place] on 7/7  Hospital visits: None in previous 6 months  Objective:  Lab Results  Component Value Date   CREATININE 1.28 (H) 07/13/2020   CREATININE 1.36 (H) 05/03/2020   CREATININE 1.19 (H) 01/14/2020    Lab Results  Component Value Date   HGBA1C 6.9 (A) 07/24/2020   Last diabetic Eye exam:  Lab Results  Component Value Date/Time   HMDIABEYEEXA No Retinopathy 04/18/2019 12:00 AM    Last diabetic Foot exam: No results found for: HMDIABFOOTEX    Social History   Tobacco Use  Smoking Status Former   Years: 20.00   Types: Cigarettes   Quit date: 09/05/1979   Years since quitting: 41.2  Smokeless Tobacco Former   BP Readings from Last 3 Encounters:  11/09/20  120/68  09/25/20 (!) 125/58  07/24/20 (!) 133/57   Pulse Readings from Last 3 Encounters:  11/09/20 78  07/24/20 81  07/13/20 78   Wt Readings from Last 3 Encounters:  11/09/20 230 lb 12.8 oz (104.7 kg)  09/23/20 231 lb (104.8 kg)  07/24/20 229 lb 12.8 oz (104.2 kg)    Assessment: Review of patient past medical history, allergies, medications, health status, including review of consultants reports, laboratory and other test data, was performed as part of comprehensive evaluation and provision of chronic care management services.   SDOH:  (Social Determinants of Health) assessments and interventions performed: none   CCM Care Plan  Allergies  Allergen Reactions   Ferumoxytol Swelling    Swelling in neck   Iron    Other     Other reaction(s): Other (See Comments) She refuses blood products-Jehovah witness   Penicillins Rash, Swelling and Hives    Medications Reviewed Today     Reviewed by Nickolas Madrid, CMA (Certified Medical Assistant) on 11/09/20 at 1433  Med List Status: <None>   Medication Order Taking? Sig Documenting Provider Last Dose Status Informant  ACCU-CHEK GUIDE test strip [id number] Yes Use to check blood sugar twice daily [name], DO Taking Active   Accu-Chek Softclix Lancets lancets [id number] Yes Use to check blood sugar twice daily [name], DO Taking Active   acetaminophen (TYLENOL) 500 MG tablet [id number] Yes Take 1,000 mg by mouth every 6 (six) hours as needed for moderate pain.  [provider] Taking Active Child  Artificial Tear Solution (GENTEAL TEARS) 0.1-0.2-0.3 % SOLN  [id number] Yes Place 1 drop into both eyes 3 (three) times daily as needed (dry/irritated eyes.). [provider] Taking Active Child  aspirin 81 MG chewable tablet [id number] Yes Chew 1 tablet (81 mg total) by mouth daily. Eileen Stanford, PA-C Taking Active Child  B COMPLEX-C PO [id number] Yes Take 10 mLs by mouth daily.  [provider] Taking Active Child  baclofen (LIORESAL) 10 MG tablet [id number] Yes TAKE 1 TO 2 TABLETS BY MOUTH TWICE DAILY AS NEEDED FOR MUSCLE SPASM Parks Ranger, Devonne Doughty, DO Taking Active Child  Blood Glucose Monitoring Suppl (ACCU-CHEK GUIDE) w/Device KIT [id number] Yes Use to check blood sugar twice daily [name], DO Taking Active   cephALEXin (KEFLEX) 500 MG capsule [id number] Yes TAKE FOUR CAPSULES BY MOUTH ONE HOUR BEFORE ANY DENTAL PROCEDURES OR CLEANINGS Eileen Stanford, PA-C Taking Active   Cholecalciferol (VITAMIN D3) 125 MCG (5000 UT) TABS [id number] Yes Take 5,000 Units by mouth daily. [provider] Taking Active Child    Discontinued 09/05/19 1316   Ferrous Sulfate (IRON) 325 (65 Fe) MG TABS [id number] Yes TAKE 1 TABLET BY MOUTH TWICE DAILY WITH A MEAL [name], Devonne Doughty, DO Taking Active Child  furosemide (LASIX) 20 MG tablet [id number] Yes Take 1 tablet (20 mg total) by mouth daily as needed for edema. End, Harrell Gave, MD Taking Active Child  hydrochlorothiazide (HYDRODIURIL) 25 MG tablet [id number] Yes Take 1 tablet (25 mg total) by mouth daily. End, Harrell Gave, MD Taking Active   lactobacillus acidophilus (BACID) TABS tablet [id number] Yes Take 2 tablets by mouth 2 (two) times daily as needed (digestive health.). [provider] Taking Active            Med Note Vianne Bulls Nov 03, 2019  3:06 PM)    levocetirizine (XYZAL) 5 MG tablet [id number] Yes TAKE 1 TABLET BY MOUTH ONCE DAILY IN THE EVENING [name], Devonne Doughty, DO Taking Active   losartan (COZAAR) 50 MG tablet [id number] Yes Take 1 tablet (50 mg total) by mouth daily. End, Harrell Gave, MD Taking Active   magnesium oxide (MAG-OX) 400 MG tablet [id number] Yes Take 400 mg by mouth daily. [provider] Taking Active   [facility] injection [id number] Yes Inject 0.5 mL into muscle for first dose, then repeat in 2-6 months. [name], DO  Taking Active   simvastatin (ZOCOR) 20 MG tablet [id number] Yes TAKE 1 TABLET BY MOUTH ONCE DAILY AT  6  PM [name], Devonne Doughty, DO Taking Active   TRULICITY 6.75 FF/6.3WG SOPN [id number] Yes Inject 0.75 mg into the skin once a week. [name], DO Taking Active             Patient Active Problem List   Diagnosis Date Noted   S/P TAVR (transcatheter aortic valve replacement) 11/09/2019   Patient is [name]    Severe aortic stenosis    Thyroid disease    Chronic heart failure with preserved ejection fraction (HFpEF) ([place]) 09/30/2019   Normocytic anemia 02/17/2019   Morbid obesity ([place]) 11/05/2017   Neutropenia ([place]) 04/24/2017   Vitamin D deficiency 10/08/2016   Meniere's disease of right ear 07/04/2016   TIA (transient ischemic attack) 07/04/2016   Nonrheumatic aortic valve stenosis 07/04/2016   Seasonal allergies 01/30/2016   Recurrent UTI 10/26/2015   Iron deficiency anemia due to chronic blood loss 02/28/2015   History of adenomatous polyp of colon 02/13/2015   Osteoarthritis of both knees 12/05/2014   Acid reflux 12/05/2014  Benign neoplasm of colon 12/05/2014   Type 2 diabetes mellitus with other specified complication ([place]) 08/67/6195   Essential hypertension 12/05/2014   Hyperlipidemia associated with type 2 diabetes mellitus ([place]) 12/05/2014   Arthritis, degenerative 12/05/2014   Anemia due to multiple mechanisms 12/05/2014   Abnormal liver enzymes 12/05/2014    Immunization History  Administered Date(s) Administered   Fluad Quad(high Dose 65+) 01/12/2019, 02/11/2020   Influenza, High Dose Seasonal PF 02/09/2014, 01/05/2015, 01/30/2016, 01/14/2017   Influenza,inj,Quad PF,6+ Mos 04/05/2013   Influenza-Unspecified 02/03/2014, 02/03/2018   Moderna Sars-Covid-2 Vaccination 09/25/2019, 10/23/2019   Pneumococcal Conjugate-13 08/05/2014   Pneumococcal Polysaccharide-23 04/05/2013    Conditions to be addressed/monitored: T2DM, HLD, HTN,  HFpEF  Care Plan : PharmD - Medication Assistance/Management  Updates made by Vella Raring, RPH-CPP since 11/20/2020 12:00 AM     Problem: Disease Progression      Long-Range Goal: Disease Progression Prevented or Minimized   Start Date: 10/30/2020  Expected End Date: 01/28/2021  This Visit's Progress: On track  Recent Progress: On track  Priority: High  Note:   Current Barriers:  Unable to independently afford treatment regimen  Pharmacist Clinical Goal(s):  Over the next 90 days, patient will verbalize ability to afford treatment regimen through collaboration with PharmD and provider.   Interventions: 1:1 collaboration with [name], DO regarding development and update of comprehensive plan of care as evidenced by provider attestation and co-signature Inter-disciplinary care team collaboration (see longitudinal plan of care) Perform chart review. Patient seen for Office Visit with [facility] on 7/7  Medication Assistance: Receive message from [place] letting me know that she has not yet received patient portion of assistance program application back as of yet Receive phone call from patient Reports mailed patient assistance program application back to [place] on 7/11 Reports planning to use last sample dose of Trulicity 0.93 mg weekly on Saturday, 7/23 (but does not have dose for 7/30) and unable to afford refill from pharmacy since in coverage gap of Medicare Part D Plan Will collaborate with PCP/office - to determine if Trulicity 2.67 mg sample available for patient to pick up at upcoming appointment with PCP on 7/21 Will continue to collaborate with [place] for aid to patient with completing patient assistance application  T2WP: Current treatment: Trulicity 8.09 mg weekly on Saturdays Reports recent home blood sugars ranging 120s-161 Have counseled on impact of diet and exercise on blood sugar Encouraged patient to  limit carbohydrate portion sizes Note exercise limited by arthritis. Reported doing chair and hand exercises at home Reported misses going to the pool, but stopped going due to COVID-19 pandemic  Patient Goals/Self-Care Activities Over the next 90 days, patient will:  - take medications as prescribed - check glucose, document, and provide at future appointments - check blood pressure, document, and provide at future appointments - collaborate with provider on medication access solutions  Follow Up Plan: Telephone follow up appointment with care management team member scheduled for: 12/11/2020 at 9:30 AM       Patient's preferred pharmacy is:  [facility] [address], [place] - [place] [address] Phone: [phone] Fax: [phone]  [email or web address] - [place], [place] - [phone] [facility] [address] [place] [address] Phone: [phone] Fax: [phone]   Follow Up:  Patient agrees to Care Plan and Follow-up.  Harlow Asa, PharmD, Para [place], CPP Clinical Pharmacist [facility] [phone]

## 2020-11-21 ENCOUNTER — Telehealth: Payer: Self-pay | Admitting: Pharmacy Technician

## 2020-11-21 DIAGNOSIS — Z596 Low income: Secondary | ICD-10-CM

## 2020-11-21 NOTE — Progress Notes (Signed)
[facility])                                            [place] Team    11/21/2020  Diana Harmon [birth year] [id number]  Received both patient and provider portion(s) of patient assistance application(s) for Trulicity. Faxed completed application and required documents into [employer].   [name] [name], [name]  [phone]

## 2020-11-23 ENCOUNTER — Ambulatory Visit
Admission: RE | Admit: 2020-11-23 | Discharge: 2020-11-23 | Disposition: A | Discharge status: Home / Self Care | Payer: Medicare Other | Source: Ambulatory Visit | Attending: Family Medicine | Admitting: Family Medicine

## 2020-11-23 ENCOUNTER — Encounter: Payer: Self-pay | Admitting: Family Medicine

## 2020-11-23 ENCOUNTER — Other Ambulatory Visit: Payer: Self-pay

## 2020-11-23 ENCOUNTER — Ambulatory Visit (INDEPENDENT_AMBULATORY_CARE_PROVIDER_SITE_OTHER): Payer: Medicare Other | Admitting: Family Medicine

## 2020-11-23 ENCOUNTER — Other Ambulatory Visit: Payer: Self-pay | Admitting: Family Medicine

## 2020-11-23 VITALS — BP 139/57 | HR 85 | Ht 60.0 in | Wt 230.6 lb

## 2020-11-23 DIAGNOSIS — N6489 Other specified disorders of breast: Secondary | ICD-10-CM

## 2020-11-23 DIAGNOSIS — Z23 Encounter for immunization: Secondary | ICD-10-CM

## 2020-11-23 DIAGNOSIS — I10 Essential (primary) hypertension: Secondary | ICD-10-CM

## 2020-11-23 DIAGNOSIS — R922 Inconclusive mammogram: Secondary | ICD-10-CM | POA: Diagnosis not present

## 2020-11-23 DIAGNOSIS — R928 Other abnormal and inconclusive findings on diagnostic imaging of breast: Secondary | ICD-10-CM | POA: Diagnosis not present

## 2020-11-23 DIAGNOSIS — Z0000 Encounter for general adult medical examination without abnormal findings: Secondary | ICD-10-CM

## 2020-11-23 DIAGNOSIS — E785 Hyperlipidemia, unspecified: Secondary | ICD-10-CM | POA: Diagnosis not present

## 2020-11-23 DIAGNOSIS — E1169 Type 2 diabetes mellitus with other specified complication: Secondary | ICD-10-CM

## 2020-11-23 MED ORDER — SHINGRIX 50 MCG/0.5ML IM SUSR: INTRAMUSCULAR | 1 refills | Status: DC

## 2020-11-23 NOTE — Progress Notes (Signed)
Subjective:    Patient ID: Diana Harmon, female    DOB: 09/03/1945, 75 y.o.   MRN: [id number]  Diana Harmon is a 75 y.o. female presenting on 11/23/2020 for Diabetes  Here with daughter, Diana Harmon  HPI  CHRONIC DM, Type 2 with Hyperlipidemia, Morbid Obesity HTN Previously A1c 7-8 range. Last A1c 7.4 (10/04/20) [place] Admits diet changes since last visit had been on Trulicity 0.75mg  weekly inj and major improved diet Side effect on metformin has stopped. CBGs: Improved. Avg 140s - stableoverall Meds - Trulicity 0.75mg  weekly inj - needs sample, out of med until can get financial assistance Reports good compliance, tolerating med well Currently on ARB, ASA 81, Simvastatin 20mg  Lifestyle: Improving DM diet. Low carb - Followed by Dr Lorie Apley optometry - last exam done 10/19/20. Negative. Denies hypoglycemia     Health Maintenance:  Mammogram scheduled for today 11/23/20  UTD COVID vaccine booster #1, due for #2 if interested.  Depression screen [facility] 2/9 11/23/2020 04/25/2020 03/10/2020  Decreased Interest 1 0 0  Down, Depressed, Hopeless 0 0 0  PHQ - 2 Score 1 0 0  Altered sleeping 0 - -  Tired, decreased energy 2 - -  Change in appetite 0 - -  Feeling bad or failure about yourself  0 - -  Trouble concentrating 0 - -  Moving slowly or fidgety/restless 0 - -  Suicidal thoughts 0 - -  PHQ-9 Score 3 - -  Difficult doing work/chores Not difficult at all - -    Social History   Tobacco Use   Smoking status: Former    Years: 20.00    Types: Cigarettes    Quit date: 09/05/1979    Years since quitting: 41.2   Smokeless tobacco: Former  Scientific laboratory technician Use: Never used  Substance Use Topics   Alcohol use: Yes    Alcohol/week: 0.0 - 1.0 standard drinks    Comment: occ. wine   Drug use: No    Review of Systems Per HPI unless specifically indicated above     Objective:    BP (!) 139/57   Pulse 85   Ht 5' (1.524 m)   Wt 230 lb 9.6 oz  (104.6 kg)   SpO2 100%   BMI 45.04 kg/m   Wt Readings from Last 3 Encounters:  11/23/20 230 lb 9.6 oz (104.6 kg)  11/09/20 230 lb 12.8 oz (104.7 kg)  09/23/20 231 lb (104.8 kg)    Physical Exam Vitals and nursing note reviewed.  Constitutional:      General: She is not in acute distress.    Appearance: She is well-developed. She is obese. She is not diaphoretic.     Comments: Well-appearing, comfortable, cooperative  HENT:     Head: Normocephalic and atraumatic.  Eyes:     General:        Right eye: No discharge.        Left eye: No discharge.     Conjunctiva/sclera: Conjunctivae normal.  Neck:     Thyroid: No thyromegaly.  Cardiovascular:     Rate and Rhythm: Normal rate and regular rhythm.     Heart sounds: Normal heart sounds. No murmur heard. Pulmonary:     Effort: Pulmonary effort is normal. No respiratory distress.     Breath sounds: Normal breath sounds. No wheezing or rales.  Musculoskeletal:        General: Normal range of motion.     Cervical back: Normal range of motion  and neck supple.  Lymphadenopathy:     Cervical: No cervical adenopathy.  Skin:    General: Skin is warm and dry.     Findings: No erythema or rash.  Neurological:     Mental Status: She is alert and oriented to person, place, and time.  Psychiatric:        Behavior: Behavior normal.     Comments: Well groomed, good eye contact, normal speech and thoughts    Recent Labs    03/10/20 1017 07/24/20 1036 10/04/20 0000  HGBA1C 8.1* 6.9* 7.4     Results for orders placed or performed in visit on 11/23/20  HM DIABETES EYE EXAM  Result Value Ref Range   HM Diabetic Eye Exam No Retinopathy No Retinopathy      Assessment & Plan:   Problem List Items Addressed This Visit     Type 2 diabetes mellitus with other specified complication ([place]) - Primary    Stable A1c 7.4 within range No hyperglycemia or hypoglycemia Complications - other including hyperlipidemia, GERD, obesity - increases  risk of future cardiovascular complications   Plan:  Continue Trulicity 0.75mg  weekly inj doing well on GLP1 - sample today 2 week, will work w/ CCM pharmacy to apply to [employer] PAP Off Metformin 2. Encourage improved lifestyle - low carb, low sugar diet, reduce portion size, continue improving regular exercise 3. Check CBG, bring log to next visit for review 4. Continue ASA, ARB, Statin        Relevant Orders   POCT glycosylated hemoglobin (Hb A1C)   Morbid obesity ([place])   Hyperlipidemia associated with type 2 diabetes mellitus ([place])    Controlled  Plan: 1. Continue current meds - Simvastatin 20mg  daily 2. Encourage improved lifestyle - low carb/cholesterol, reduce portion size, continue improving activity as tolerated  Future return for lipid panel       Essential hypertension    Well-controlled HTN - Home BP readings reviewed  Complication meniere's disease, mild AS, CKD II   Plan:  1.  Continue current BP regimen HCTZ 25mg  daily, Losartan 50mg  daily refill 2. Encourage improved lifestyle - low sodium diet, regular exercise 3. Continue monitor BP outside office, bring readings to next visit, if persistently >140/90 or new symptoms notify office sooner       Other Visit Diagnoses     Need for shingles vaccine       Relevant Medications   SHINGRIX injection      Printed Shingrix vaccine rx for taking to pharmacy if she is ready   CC Chart to [facility] for review for DM management, PAP Program from [place]  Patient was given sample Trulicity 0.75mg  x 2 pen today - inquire about [employer] form etc, received fax 7/19 with missing information / enrollment. Will be able to offer even a 1.5 mg dose sample if we dont have any more 0.75 if she runs out before can get PAP Supply.   Meds ordered this encounter  Medications   SHINGRIX injection    Sig: Inject 0.5 mL into muscle for shingles vaccine. Repeat dose in 2-6 months.    Dispense:  0.5 mL    Refill:  1        Follow up plan: Return in about 5 months (around 04/25/2021) for 5 month fasting lab only then 1 week later Annual Physical.  Future labs ordered for 04/20/21  [name], [facility] 11/23/2020, 9:55 AM

## 2020-11-23 NOTE — Assessment & Plan Note (Signed)
Stable A1c 7.4 within range No hyperglycemia or hypoglycemia Complications - other including hyperlipidemia, GERD, obesity - increases risk of future cardiovascular complications   Plan:  Continue Trulicity 0.75mg  weekly inj doing well on GLP1 - sample today 2 week, will work w/ CCM pharmacy to apply to [employer] PAP Off Metformin 2. Encourage improved lifestyle - low carb, low sugar diet, reduce portion size, continue improving regular exercise 3. Check CBG, bring log to next visit for review 4. Continue ASA, ARB, Statin

## 2020-11-23 NOTE — Patient Instructions (Addendum)
Thank you for coming to the office today.  Shingles shot at pharmacy  A1c today  Talking to [place] and coordinating the program for Trulicity coverage  Sample today  DUE for FASTING BLOOD WORK (no food or drink after midnight before the lab appointment, only water or coffee without cream/sugar on the morning of)  SCHEDULE "Lab Only" visit in the morning at the clinic for lab draw in 5 MONTHS   - Make sure Lab Only appointment is at about 1 week before your next appointment, so that results will be available  For Lab Results, once available within 2-3 days of blood draw, you can can log in to MyChart online to view your results and a brief explanation. Also, we can discuss results at next follow-up visit.    Please schedule a Follow-up Appointment to: Return in about 5 months (around 04/25/2021) for 5 month fasting lab only then 1 week later Annual Physical.  If you have any other questions or concerns, please feel free to call the office or send a message through [place]. You may also schedule an earlier appointment if necessary.  Additionally, you may be receiving a survey about your experience at our office within a few days to 1 week by e-mail or mail. We value your feedback.  [name], DO [place]

## 2020-11-23 NOTE — Assessment & Plan Note (Signed)
Controlled  Plan: 1. Continue current meds - Simvastatin 20mg  daily 2. Encourage improved lifestyle - low carb/cholesterol, reduce portion size, continue improving activity as tolerated  Future return for lipid panel

## 2020-11-23 NOTE — Assessment & Plan Note (Signed)
Well-controlled HTN - Home BP readings reviewed  Complication meniere's disease, mild AS, CKD II   Plan:  1.  Continue current BP regimen HCTZ '25mg'$  daily, Losartan '50mg'$  daily refill 2. Encourage improved lifestyle - low sodium diet, regular exercise 3. Continue monitor BP outside office, bring readings to next visit, if persistently >140/90 or new symptoms notify office sooner

## 2020-11-24 ENCOUNTER — Telehealth: Payer: Self-pay | Admitting: Pharmacy Technician

## 2020-11-24 DIAGNOSIS — Z596 Low income: Secondary | ICD-10-CM

## 2020-11-24 NOTE — Progress Notes (Signed)
[facility])                                            [place] Team    11/24/2020  Ronnetta Faunce Guarnieri March 30, 1946 [id number]  Care coordination call placed to [place] cares in regards to determination status on Trulicity application.  Spoke to [place] who informed patient was APPROVED 11/21/20-05/05/21. She informed she would transfer me to [place] about shipment status. Spoke to [employer] at [employer] who informed a 4 month supply of medication would be delivered to [address], [place], [place] [address] as requested by patient. This is the patient's daughter's address and it is to be delivered on 11/29/20 via [place].  Successful outreach call placed to patient, HIPAA confirmed and verified. Informed patient of the above information. Patient verbalized understanding.  Santanna Whitford P. Varian Innes, [place]  [phone]

## 2020-11-27 ENCOUNTER — Telehealth: Payer: Self-pay

## 2020-11-29 ENCOUNTER — Ambulatory Visit: Payer: Self-pay | Admitting: Pharmacist

## 2020-11-29 DIAGNOSIS — E1169 Type 2 diabetes mellitus with other specified complication: Secondary | ICD-10-CM

## 2020-11-29 NOTE — Chronic Care Management (AMB) (Signed)
  Chronic Care Management Pharmacy Note  11/29/2020 Name:  Diana Harmon MRN:  [id number] DOB:  05/16/1945    Subjective: Diana Harmon is an 75 y.o. year old female who is a primary patient of [name], Alexander J, DO.  The CCM team was consulted for assistance with disease management and care coordination needs.    Engaged with patient by telephone for follow up visit in response to provider referral for pharmacy case management and/or care coordination services.   Consent to Services:  The patient was given information about Chronic Care Management services, agreed to services, and gave verbal consent prior to initiation of services.  Please see initial visit note for detailed documentation.   Patient Care Team: [name], Alexander J, DO as PCP - General (Family Medicine) End, Christopher, MD as PCP - Cardiology (Cardiology) Brahmanday, Govinda R, MD as Consulting Physician (Internal Medicine) Bell, Phillip T., MD (Ophthalmology) End, Christopher, MD as Consulting Physician (Cardiology) Tate, Pamela J, RN as Case Manager (General Practice) Brahmanday, Govinda R, MD as Consulting Physician (Internal Medicine) Dhalla, Elisabeth A, RPH-CPP as Pharmacist  Recent office visits: Patient seen for Office Visit with PCP on 7/21 for diabetes follow up  Hospital visits: None in previous 6 months  Objective:  Lab Results  Component Value Date   CREATININE 1.28 (H) 07/13/2020   CREATININE 1.36 (H) 05/03/2020   CREATININE 1.19 (H) 01/14/2020    Lab Results  Component Value Date   HGBA1C 7.4 10/04/2020   Last diabetic Eye exam:  Lab Results  Component Value Date/Time   HMDIABEYEEXA No Retinopathy 10/19/2020 12:00 AM    Last diabetic Foot exam: No results found for: HMDIABFOOTEX    Social History   Tobacco Use  Smoking Status Former   Years: 20.00   Types: Cigarettes   Quit date: 09/05/1979   Years since quitting: 41.2  Smokeless Tobacco  Former   BP Readings from Last 3 Encounters:  11/23/20 (!) 139/57  11/09/20 120/68  09/25/20 (!) 125/58   Pulse Readings from Last 3 Encounters:  11/23/20 85  11/09/20 78  07/24/20 81   Wt Readings from Last 3 Encounters:  11/23/20 230 lb 9.6 oz (104.6 kg)  11/09/20 230 lb 12.8 oz (104.7 kg)  09/23/20 231 lb (104.8 kg)    Assessment: Review of patient past medical history, allergies, medications, health status, including review of consultants reports, laboratory and other test data, was performed as part of comprehensive evaluation and provision of chronic care management services.   SDOH:  (Social Determinants of Health) assessments and interventions performed: none   CCM Care Plan  Allergies  Allergen Reactions   Ferumoxytol Swelling    Swelling in neck   Iron    Other     Other reaction(s): Other (See Comments) She refuses blood products-Jehovah witness   Penicillins Rash, Swelling and Hives    Medications Reviewed Today     Reviewed by [name], Alexander J, DO (Physician) on 11/23/20 at 0955  Med List Status: <None>   Medication Order Taking? Sig Documenting Provider Last Dose Status Informant  ACCU-CHEK GUIDE test strip [id number] Yes Use to check blood sugar twice daily [name], Alexander J, DO Taking Active   Accu-Chek Softclix Lancets lancets [id number] Yes Use to check blood sugar twice daily [name], Alexander J, DO Taking Active   acetaminophen (TYLENOL) 500 MG tablet [id number] Yes Take 1,000 mg by mouth every 6 (six) hours as needed for moderate pain.  [provider] Taking Active Child    Artificial Tear Solution (GENTEAL TEARS) 0.1-0.2-0.3 % SOLN [id number] Yes Place 1 drop into both eyes 3 (three) times daily as needed (dry/irritated eyes.). [provider] Taking Active Child  aspirin 81 MG chewable tablet [id number] Yes Chew 1 tablet (81 mg total) by mouth daily. Thompson, Kathryn R, PA-C Taking Active Child  B COMPLEX-C PO  [id number] Yes Take 10 mLs by mouth daily. [provider] Taking Active Child  baclofen (LIORESAL) 10 MG tablet [id number] Yes TAKE 1 TO 2 TABLETS BY MOUTH TWICE DAILY AS NEEDED FOR MUSCLE SPASM [name], Alexander J, DO Taking Active Child  Blood Glucose Monitoring Suppl (ACCU-CHEK GUIDE) w/Device KIT [id number] Yes Use to check blood sugar twice daily [name], Alexander J, DO Taking Active   cephALEXin (KEFLEX) 500 MG capsule [id number] Yes TAKE FOUR CAPSULES BY MOUTH ONE HOUR BEFORE ANY DENTAL PROCEDURES OR CLEANINGS Thompson, Kathryn R, PA-C Taking Active   Cholecalciferol (VITAMIN D3) 125 MCG (5000 UT) TABS [id number] Yes Take 5,000 Units by mouth daily. [provider] Taking Active Child    Discontinued 09/05/19 1316   Ferrous Sulfate (IRON) 325 (65 Fe) MG TABS [id number] Yes TAKE 1 TABLET BY MOUTH TWICE DAILY WITH A MEAL [name], Alexander J, DO Taking Active Child  furosemide (LASIX) 20 MG tablet [id number] Yes Take 1 tablet (20 mg total) by mouth daily as needed for edema. End, Christopher, MD Taking Active Child  hydrochlorothiazide (HYDRODIURIL) 25 MG tablet [id number] Yes Take 1 tablet (25 mg total) by mouth daily. End, Christopher, MD Taking Active   lactobacillus acidophilus (BACID) TABS tablet [id number] Yes Take 2 tablets by mouth 2 (two) times daily as needed (digestive health.). [provider] Taking Active            Med Note (LAWSON, BROOKE C   Wed Nov 03, 2019  3:06 PM)    levocetirizine (XYZAL) 5 MG tablet [id number] Yes TAKE 1 TABLET BY MOUTH ONCE DAILY IN THE EVENING [name], Alexander J, DO Taking Active   losartan (COZAAR) 50 MG tablet [id number] Yes Take 1 tablet (50 mg total) by mouth daily. End, Christopher, MD Taking Active   magnesium oxide (MAG-OX) 400 MG tablet [id number] Yes Take 400 mg by mouth daily. [provider] Taking Active   SHINGRIX injection [id number] Yes Inject 0.5 mL into muscle for first dose, then repeat in 2-6  months. [name], Alexander J, DO Taking Active   simvastatin (ZOCOR) 20 MG tablet [id number] Yes TAKE 1 TABLET BY MOUTH ONCE DAILY AT  6  PM [name], Alexander J, DO Taking Active   TRULICITY 0.75 MG/0.5ML SOPN [id number] Yes Inject 0.75 mg into the skin once a week. [name], Alexander J, DO Taking Active             Patient Active Problem List   Diagnosis Date Noted   S/P TAVR (transcatheter aortic valve replacement) 11/09/2019   Patient is [name]    Severe aortic stenosis    Thyroid disease    Chronic heart failure with preserved ejection fraction (HFpEF) (HCC) 09/30/2019   Normocytic anemia 02/17/2019   Morbid obesity (HCC) 11/05/2017   Neutropenia (HCC) 04/24/2017   Vitamin D deficiency 10/08/2016   Meniere's disease of right ear 07/04/2016   TIA (transient ischemic attack) 07/04/2016   Nonrheumatic aortic valve stenosis 07/04/2016   Seasonal allergies 01/30/2016   Recurrent UTI 10/26/2015   Iron deficiency anemia due to chronic blood loss 02/28/2015   History of adenomatous polyp of colon 02/13/2015   Osteoarthritis of both knees   12/05/2014   Acid reflux 12/05/2014   Benign neoplasm of colon 12/05/2014   Type 2 diabetes mellitus with other specified complication (HCC) 12/05/2014   Essential hypertension 12/05/2014   Hyperlipidemia associated with type 2 diabetes mellitus (HCC) 12/05/2014   Arthritis, degenerative 12/05/2014   Anemia due to multiple mechanisms 12/05/2014   Abnormal liver enzymes 12/05/2014    Immunization History  Administered Date(s) Administered   Fluad Quad(high Dose 65+) 01/12/2019, 02/11/2020   Influenza, High Dose Seasonal PF 02/09/2014, 01/05/2015, 01/30/2016, 01/14/2017   Influenza,inj,Quad PF,6+ Mos 04/05/2013   Influenza-Unspecified 02/03/2014, 02/03/2018   Moderna Sars-Covid-2 Vaccination 09/25/2019, 10/23/2019, 04/23/2020   Pneumococcal Conjugate-13 08/05/2014   Pneumococcal Polysaccharide-23 04/05/2013     Conditions to be addressed/monitored: HTN and DMII  Care Plan : PharmD - Medication Assistance/Management  Updates made by Dhalla, Elisabeth A, RPH-CPP since 11/29/2020 12:00 AM     Problem: Disease Progression      Long-Range Goal: Disease Progression Prevented or Minimized   Start Date: 10/30/2020  Expected End Date: 01/28/2021  This Visit's Progress: On track  Recent Progress: On track  Priority: High  Note:   Current Barriers:  Unable to independently afford treatment regimen Patient APPROVED for [employer] Patient Assistance Program for Trulicity, eligible from 11/21/20-05/05/21  Pharmacist Clinical Goal(s):  Over the next 90 days, patient will verbalize ability to afford treatment regimen through collaboration with PharmD and provider.   Interventions: 1:1 collaboration with [name], Alexander J, DO regarding development and update of comprehensive plan of care as evidenced by provider attestation and co-signature Inter-disciplinary care team collaboration (see longitudinal plan of care) Perform chart review. Patient seen for Office Visit with PCP on 7/21 for diabetes follow up  Medication Assistance: Receive message from patient letting me know that she received shipment of Trulicity from patient assistance program today Patient also reports noted from packaging that patient assistance program has her phone number listed incorrectly. Requests assistance with getting number corrected. Call patient back and provide phone number for [employer] patient assistance program for patient to call to have number corrected. Will send message to [facility] CPhT Jill Simcox to let her know patient has received Trulicity from assistance program   Patient Goals/Self-Care Activities Over the next 90 days, patient will:  - take medications as prescribed - check glucose, document, and provide at future appointments - check blood pressure, document, and provide at future appointments -  collaborate with provider on medication access solutions  Follow Up Plan: Telephone follow up appointment with care management team member scheduled for: 12/11/2020 at 9:30 AM       Patient's preferred pharmacy is:  [id number] - [place], [place] - [address] [place] [address] [place] [address] [place] [address] Phone: [phone] Fax: [phone]  [email or web address] - [place], [place] - [address] [place] [address] Phone: [phone] Fax: [phone]    Follow Up:  Patient agrees to Care Plan and Follow-up.  Elisabeth Delles, PharmD, BCACP, CPP Clinical Pharmacist [facility] [place] [phone]      

## 2020-11-29 NOTE — Patient Instructions (Signed)
Visit Information  PATIENT GOALS:  Goals Addressed             This Visit's Progress    Pharmacy Goals       Our goal A1c is less than 7%. This corresponds with fasting sugars less than 130 and 2 hour after meal sugars less than 180. Please keep a log of your results when checking your blood sugar  Our goal bad cholesterol, or LDL, is less than 70 . This is why it is important to continue taking your simvastatin  Please check your home blood pressure, keep a log of the results and bring this with you to your medical appointments.  Feel free to call me with any questions or concerns. I look forward to our next call!    Wallace Cullens, PharmD, Para March, CPP Clinical Pharmacist [facility] [phone]         The patient verbalized understanding of instructions, educational materials, and care plan provided today and declined offer to receive copy of patient instructions, educational materials, and care plan.   Telephone follow up appointment with care management team member scheduled for: 12/11/2020 at 9:30 AM

## 2020-12-04 ENCOUNTER — Telehealth: Payer: Self-pay

## 2020-12-07 ENCOUNTER — Ambulatory Visit (INDEPENDENT_AMBULATORY_CARE_PROVIDER_SITE_OTHER): Payer: Medicare Other | Admitting: General Practice

## 2020-12-07 ENCOUNTER — Other Ambulatory Visit: Payer: Self-pay

## 2020-12-07 ENCOUNTER — Telehealth: Payer: Medicare Other | Admitting: General Practice

## 2020-12-07 DIAGNOSIS — M79642 Pain in left hand: Secondary | ICD-10-CM

## 2020-12-07 DIAGNOSIS — I5032 Chronic diastolic (congestive) heart failure: Secondary | ICD-10-CM

## 2020-12-07 DIAGNOSIS — M19042 Primary osteoarthritis, left hand: Secondary | ICD-10-CM

## 2020-12-07 DIAGNOSIS — M79641 Pain in right hand: Secondary | ICD-10-CM

## 2020-12-07 DIAGNOSIS — E1169 Type 2 diabetes mellitus with other specified complication: Secondary | ICD-10-CM

## 2020-12-07 DIAGNOSIS — I10 Essential (primary) hypertension: Secondary | ICD-10-CM

## 2020-12-07 DIAGNOSIS — M19041 Primary osteoarthritis, right hand: Secondary | ICD-10-CM

## 2020-12-07 NOTE — Chronic Care Management (AMB) (Signed)
Chronic Care Management   CCM RN Visit Note  12/07/2020 Name: Diana Harmon MRN: [id number] DOB: 1945/08/07  Subjective: Diana Harmon is a 75 y.o. year old female who is a primary care patient of Diana Hauser, DO. The care management team was consulted for assistance with disease management and care coordination needs.    Engaged with patient by telephone for follow up visit in response to provider referral for case management and/or care coordination services.   Consent to Services:  The patient was given information about Chronic Care Management services, agreed to services, and gave verbal consent prior to initiation of services.  Please see initial visit note for detailed documentation.   Patient agreed to services and verbal consent obtained.   Assessment: Review of patient past medical history, allergies, medications, health status, including review of consultants reports, laboratory and other test data, was performed as part of comprehensive evaluation and provision of chronic care management services.   SDOH (Social Determinants of Health) assessments and interventions performed:    CCM Care Plan  Allergies  Allergen Reactions   Ferumoxytol Swelling    Swelling in neck   Iron    Other     Other reaction(s): Other (See Comments) She refuses blood products-Jehovah witness   Penicillins Rash, Swelling and Hives    Outpatient Encounter Medications as of 12/07/2020  Medication Sig   ACCU-CHEK GUIDE test strip Use to check blood sugar twice daily   Accu-Chek Softclix Lancets lancets Use to check blood sugar twice daily   acetaminophen (TYLENOL) 500 MG tablet Take 1,000 mg by mouth every 6 (six) hours as needed for moderate pain.    Artificial Tear Solution (GENTEAL TEARS) 0.1-0.2-0.3 % SOLN Place 1 drop into both eyes 3 (three) times daily as needed (dry/irritated eyes.).   aspirin 81 MG chewable tablet Chew 1 tablet (81 mg total) by mouth daily.    B COMPLEX-C PO Take 10 mLs by mouth daily.   baclofen (LIORESAL) 10 MG tablet TAKE 1 TO 2 TABLETS BY MOUTH TWICE DAILY AS NEEDED FOR MUSCLE SPASM   Blood Glucose Monitoring Suppl (ACCU-CHEK GUIDE) w/Device KIT Use to check blood sugar twice daily   Cholecalciferol (VITAMIN D3) 125 MCG (5000 UT) TABS Take 5,000 Units by mouth daily.   Ferrous Sulfate (IRON) 325 (65 Fe) MG TABS TAKE 1 TABLET BY MOUTH TWICE DAILY WITH A MEAL   furosemide (LASIX) 20 MG tablet Take 1 tablet (20 mg total) by mouth daily as needed for edema.   hydrochlorothiazide (HYDRODIURIL) 25 MG tablet Take 1 tablet (25 mg total) by mouth daily.   lactobacillus acidophilus (BACID) TABS tablet Take 2 tablets by mouth 2 (two) times daily as needed (digestive health.).   levocetirizine (XYZAL) 5 MG tablet TAKE 1 TABLET BY MOUTH ONCE DAILY IN THE EVENING   losartan (COZAAR) 50 MG tablet Take 1 tablet (50 mg total) by mouth daily.   magnesium oxide (MAG-OX) 400 MG tablet Take 400 mg by mouth daily.   SHINGRIX injection Inject 0.5 mL into muscle for shingles vaccine. Repeat dose in 2-6 months.   simvastatin (ZOCOR) 20 MG tablet TAKE 1 TABLET BY MOUTH ONCE DAILY AT  6  PM   TRULICITY 7.26 OM/3.5DH SOPN Inject 0.75 mg into the skin once a week.   [DISCONTINUED] Cromolyn Sodium (NASAL ALLERGY NA) Place 1 spray into the nose daily as needed (allergies).   No facility-administered encounter medications on file as of 12/07/2020.    Patient Active Problem  List   Diagnosis Date Noted   S/P TAVR (transcatheter aortic valve replacement) 11/09/2019   Patient is [name]    Severe aortic stenosis    Thyroid disease    Chronic heart failure with preserved ejection fraction (HFpEF) ([place]) 09/30/2019   Normocytic anemia 02/17/2019   Morbid obesity ([place]) 11/05/2017   Neutropenia ([place]) 04/24/2017   Vitamin D deficiency 10/08/2016   Meniere's disease of right ear 07/04/2016   TIA (transient ischemic attack) 07/04/2016   Nonrheumatic  aortic valve stenosis 07/04/2016   Seasonal allergies 01/30/2016   Recurrent UTI 10/26/2015   Iron deficiency anemia due to chronic blood loss 02/28/2015   History of adenomatous polyp of colon 02/13/2015   Osteoarthritis of both knees 12/05/2014   Acid reflux 12/05/2014   Benign neoplasm of colon 12/05/2014   Type 2 diabetes mellitus with other specified complication ([place]) 81/85/6314   Essential hypertension 12/05/2014   Hyperlipidemia associated with type 2 diabetes mellitus ([place]) 12/05/2014   Arthritis, degenerative 12/05/2014   Anemia due to multiple mechanisms 12/05/2014   Abnormal liver enzymes 12/05/2014    Conditions to be addressed/monitored:CHF, HTN, HLD, DMII, and chronic pain   Care Plan : RNCM: Heart Failure (Adult)  Updates made by Diana Harmon since 12/07/2020 12:00 AM     Problem: RNCM: Management of HF   Priority: Medium     Long-Range Goal: RNCM: HF Symptom Exacerbation Prevented or Minimized   Start Date: 05/22/2020  Expected End Date: 12/02/2021  This Visit's Progress: On track  Priority: Medium  Note:   Current Barriers:  Wt Readings from Last 3 Encounters:  11/23/20 230 lb 9.6 oz (104.6 kg)  11/09/20 230 lb 12.8 oz (104.7 kg)  09/23/20 231 lb (104.8 kg)     Knowledge deficits related to basic heart failure pathophysiology and self care management Unable to independently manage HF Does not contact provider office for questions/concerns Lack of scale in home- weighs every other day- 12-07-2020: Discussed checking weights daily, especially when she is having edema in her feet and legs.  Financial strain  Occupational hygienist):  patient will weigh self daily and record  patient will verbalize understanding of Heart Failure Action Plan and when to call doctor  patient will take all Heart Failure mediations as prescribed Interventions:  Collaboration with Diana Hauser, DO regarding development and update of comprehensive plan of  care as evidenced by provider attestation and co-signature Inter-disciplinary care team collaboration (see longitudinal plan of care) Basic overview and discussion of pathophysiology of Heart Failure. 12-07-2020: Review of HF and how to tell subtle changes that cause exacerbation.  Provided written and verbal education on low sodium diet- 12-07-2020: Review of heart healthy/ADA diet and how water follows sodium and the need to monitor sodium content in foods.  Reviewed Heart Failure Action Plan in depth and provided written copy- 12-07-2020: Discussed with patient talking to DR. End about action plan. She has been taking her diuretic; however she has been taking x 3 days and the edema is not better.  Assessed for scales in home Discussed importance of daily weight- 12-07-2020- reviewed and ask the patient to weigh daily.  Reviewed role of diuretics in prevention of fluid overload. 09-25-2020: The patient is compliant with Lasix, has some edema noted in his feet or legs. Is having some swelling in her hands but likely from OA pain. Will see specialist for that in June. 12-07-2020: Has had swelling in her feet and legs x 3 days.  Is taking Lasix. Recommended she call the provider if edema is not resolved. Extensive education and support given.   Patient Goals/Self-Care Activities Over the next 120 days, patient will:  - Take Heart Failure Medications as prescribed - Weigh daily and record (notify MD with 3 lb weight gain over night or 5 lb in a week)- 12-07-2020- reviewed  - Follow CHF Action Plan- 12-07-2020: Following plan of care - Adhere to low sodium diet- 12-07-2020: The patient states she sometimes slips and doesn't eat right - barriers to lifestyle changes reviewed and addressed - barriers to treatment reviewed and addressed - self-awareness of signs/symptoms of worsening disease encouraged Follow Up Plan: Telephone follow up appointment with care management team member scheduled for: 02-22-2021 at 345 pm     Task: RNCM: Identify and Minimize Risk of Heart Failure Exacerbation Completed 12/07/2020  Outcome: Positive  Note:   Care Management Activities:    - barriers to lifestyle changes reviewed and addressed - barriers to treatment reviewed and addressed - self-awareness of signs/symptoms of worsening disease encouraged        Care Plan : RNCM: Hypertension (Adult)  Updates made by Diana Harmon since 12/07/2020 12:00 AM     Problem: RNCM: Hypertension (Hypertension)   Priority: Medium     Long-Range Goal: RNCM: Hypertension Monitored   Start Date: 05/22/2020  Expected End Date: 12/02/2021  This Visit's Progress: On track  Priority: Medium  Note:   Objective:  Last practice recorded BP readings:  BP Readings from Last 3 Encounters:  11/23/20 (!) 139/57  11/09/20 120/68  09/25/20 (!) 125/58      Most recent eGFR/CrCl: No results found for: EGFR  No components found for: CRCL Current Barriers:  Knowledge Deficits related to basic understanding of hypertension pathophysiology and self care management Knowledge Deficits related to understanding of medications prescribed for management of hypertension Limited Social Support Unable to independently manage HTN Lacks social connections Does not contact provider office for questions/concerns Case Manager Clinical Goal(s):  patient will verbalize understanding of plan for hypertension management  patient will demonstrate improved adherence to prescribed treatment plan for hypertension as evidenced by taking all medications as prescribed, monitoring and recording blood pressure as directed, adhering to low sodium/DASH diet  patient will demonstrate improved health management independence as evidenced by checking blood pressure as directed and notifying PCP if SBP>160 or DBP > 90, taking all medications as prescribe, and adhering to a low sodium diet as discussed. Interventions:  Collaboration with Diana Hauser, DO regarding  development and update of comprehensive plan of care as evidenced by provider attestation and co-signature Inter-disciplinary care team collaboration (see longitudinal plan of care) Evaluation of current treatment plan related to hypertension self management and patient's adherence to plan as established by provider. 12-07-2020: The patient states that she is consistently taking her blood pressure. Discussed systolic <025 or <42 diastolic. The patient little concerned at diastolic 57.  Looking at her baseline she is consistently around 56 to 58. Education and support given. Several reading provided today. She is consistently staying in 70'W diastolic and 237 to 628'B systolic. Will continue to monitor. Praised for keeping record of blood pressures.  Provided education to patient re: stroke prevention, s/s of heart attack and stroke, DASH diet, complications of uncontrolled blood pressure. 12-07-2020: Discussed heart healthy/ADA diet and compliance. The patient is trying to eat better and monitor her dietary restrictions.  Evaluation of blood pressure readings. 12-07-2020: The patient states her blood pressure is doing  well. Last reading was 117/54  pulse 61 at home today. She is taking more frequently and recording.  Reviewed medications with patient and discussed importance of compliance. 12-07-2020: The patient is compliant with medications regimen. The patient denies issues with medications  Discussed plans with patient for ongoing care management follow up and provided patient with direct contact information for care management team Advised patient, providing education and rationale, to monitor blood pressure daily and record, calling PCP for findings outside established parameters.  Patient Goals/Self-Care Activities Over the next 120 days, patient will:  - - blood pressure trends reviewed - depression screen reviewed - home or ambulatory blood pressure monitoring encouraged  Follow Up Plan: Telephone  follow up appointment with care management team member scheduled for: 02-22-2021 at 345 pm    Task: RNCM: Identify and Monitor Blood Pressure Elevation Completed 12/07/2020  Outcome: Positive  Note:   Care Management Activities:    - blood pressure trends reviewed - depression screen reviewed - home or ambulatory blood pressure monitoring encouraged        Care Plan : RNCM: Diabetes Type 2 (Adult)  Updates made by Diana Harmon since 12/07/2020 12:00 AM     Problem: RNCM: Glycemic Management (Diabetes, Type 2)   Priority: Medium     Long-Range Goal: RNCM: Glycemic Management Optimized   Start Date: 05/22/2020  Expected End Date: 12/02/2021  This Visit's Progress: On track  Priority: Medium  Note:   Objective:  Lab Results  Component Value Date   HGBA1C 7.4 10/04/2020     Lab Results  Component Value Date   CREATININE 1.28 (H) 07/13/2020   CREATININE 1.36 (H) 05/03/2020   CREATININE 1.19 (H) 01/14/2020    No results found for: EGFR Current Barriers:  Knowledge Deficits related to basic Diabetes pathophysiology and self care/management Knowledge Deficits related to medications used for management of diabetes Limited Social Support Unable to independently manage DM Lacks social connections Does not contact provider office for questions/concerns Case Manager Clinical Goal(s):  Collaboration with Diana Hauser, DO regarding development and update of comprehensive plan of care as evidenced by provider attestation and co-signature Inter-disciplinary care team collaboration (see longitudinal plan of care) patient will demonstrate improved adherence to prescribed treatment plan for diabetes self care/management as evidenced by:  daily monitoring and recording of CBG, 12-07-2020: The patient is checking her blood sugars consistently and range has been 115 to 161 the highest. Usually highest is around 149. Education and support given. The patient states she is so thankful  for the help and support of the CCM team.  adherence to ADA/ carb modified diet adherence to prescribed medication regimen Interventions:  Provided education to patient about basic DM disease process Reviewed medications with patient and discussed importance of medication adherence. 12-07-2020: Is compliant with medications, denies any issues with cost constraints. Is so thankful for pcp and pharm D support to get help with her medications cost.  Discussed plans with patient for ongoing care management follow up and provided patient with direct contact information for care management team Provided patient with written educational materials related to hypo and hyperglycemia and importance of correct treatment. 12-07-2020: Denies any low blood sugars. Her range is 115 to 161.  She is eating well and monitoring dietary intake.  Review of patient status, including review of consultants reports, relevant laboratory and other test results, and medications completed. Patient Goals/Self-Care Activities Over the next 120 days, patient will:  - Self administers oral medications as prescribed  Attends all scheduled provider appointments Checks blood sugars as prescribed and utilize hyper and hypoglycemia protocol as needed Adheres to prescribed ADA/carb modified - barriers to adherence to treatment plan identified - blood glucose readings reviewed- range is 115 to 161 - mutual A1C goal set or reviewed - resources required to improve adherence to care identified - self-awareness of signs/symptoms of hypo or hyperglycemia encouraged - use of blood glucose monitoring log promoted Follow Up Plan: Telephone follow up appointment with care management team member scheduled for: 02-22-2021 at 345 pm    Task: RNCM: Alleviate Barriers to Glycemic Management Completed 12/07/2020  Outcome: Positive  Note:   Care Management Activities:    - barriers to adherence to treatment plan identified - blood glucose readings  reviewed - mutual A1C goal set or reviewed - resources required to improve adherence to care identified - self-awareness of signs/symptoms of hypo or hyperglycemia encouraged - use of blood glucose monitoring log promoted        Care Plan : RNCM: HLD managment  Updates made by Diana Harmon since 12/07/2020 12:00 AM     Problem: RNCM: HLD management   Priority: Medium     Long-Range Goal: RNCM: HLD Health Literacy Improved   Start Date: 05/22/2020  Expected End Date: 12/02/2021  This Visit's Progress: On track  Priority: Medium  Note:   Current Barriers:  Poorly controlled hyperlipidemia, complicated by HTN, DM Current antihyperlipidemic regimen: Zocor 68m QD Most recent lipid panel:     Component Value Date/Time   CHOL 170 05/03/2020 0935   TRIG 103 05/03/2020 0935   HDL 54 05/03/2020 0935   CHOLHDL 3.1 05/03/2020 0935   CHOLHDL 2.4 07/21/2017 0000   VLDL 26 06/03/2016 0001   LDLCALC 97 05/03/2020 0935   LDLCALC 77 07/21/2017 0000   ASCVD risk enhancing conditions: age >>55 DM, HTN, CHF Unable to independently manage HLD  Lacks social connections Does not contact provider office for questions/concerns  RN Care Manager Clinical Goal(s):   patient will work with RConsulting civil engineer providers, and care team towards execution of optimized self-health management plan  patient will verbalize understanding of plan for effective management of HLD  patient will work with RNCM and pcp to address needs related to compliance, heart healthy diet, and working with the CCM team for optimal health and well being.   Interventions: Collaboration with KOlin Hauser DO regarding development and update of comprehensive plan of care as evidenced by provider attestation and co-signature Inter-disciplinary care team collaboration (see longitudinal plan of care) Medication review performed; medication list updated in electronic medical record.  Inter-disciplinary care team  collaboration (see longitudinal plan of care) Referred to pharmacy team for assistance with HLD medication management Evaluation of current treatment plan related to HLD and patient's adherence to plan as established by provider. 12-07-2020: Denies any issues related to HLD. States compliance with heart healthy/ADA diet  Advised patient to call the office for changes or questions   Patient Goals/Self-Care Activities: Over the next 120 days, patient will:   - call for medicine refill 2 or 3 days before it runs out - call if I am sick and can't take my medicine - keep a list of all the medicines I take; vitamins and herbals too - learn to read medicine labels - use a pillbox to sort medicine - drink 6 to 8 glasses of water each day - eat 5 or 6 small meals each day - fill half the plate with nonstarchy  vegetables - limit fast food meals to no more than 1 per week - manage portion size - read food labels for fat, fiber, carbohydrates and portion size - be open to making changes - I can manage, know and watch for signs of a heart attack - if I have chest pain, call for help - learn about small changes that will make a big difference - learn my personal risk factors  - patient's preferred learning methods utilized - questions encouraged - readiness to learn monitored  Follow Up Plan: Telephone follow up appointment with care management team member scheduled for: 02-22-2021 at 345 pm      Task: RNCM: Identify Deficit and Forest Completed 12/07/2020  Outcome: Positive  Note:   Care Management Activities:    - patient's preferred learning methods utilized - questions encouraged - readiness to learn monitored        Care Plan : RNCM: Osteoarthritis (Adult)  Updates made by Diana Harmon since 12/07/2020 12:00 AM     Problem: RNCM: Pain (Osteoarthritis) lower back, knees, hands and sometimes feet   Priority: High     Long-Range Goal: RNCM: Manage Pain   Start  Date: 05/22/2020  Expected End Date: 12/02/2021  This Visit's Progress: On track  Priority: High  Note:   Current Barriers:  Knowledge Deficits related to managing acute/chronic pain Non-adherence to scheduled provider appointments Non-adherence to prescribed medication regimen Difficulty obtaining medications Chronic Disease Management support and education needs related to chronic pain Unable to independently manage new onset of pain in bilateral hands  Does not contact provider office for questions/concerns Nurse Case Manager Clinical Goal(s):  patient will verbalize understanding of plan for managing pain. 12-07-2020:The patient is concerned about this pain and discomfort. Is hopeful the specialist will have some answers. Sees specialist again on 01-04-2021 after testing and other things she has been trying.  patient will attend all scheduled medical appointments: The patient sees the specialist on 01-04-2021 patient will demonstrate use of different relaxation  skills and/or diversional activities to assist with pain reduction (distraction, imagery, relaxation, massage, acupressure, TENS, heat, and cold application patient will report pain at a level less than 3 to 4 on a 10-10 rating scale patient will use pharmacological and nonpharmacological pain relief strategies patient will verbalize acceptable level of pain relief and ability to engage in desired activities patient will engage in desired activities without an increase in pain level Interventions:  Collaboration with Diana Hauser, DO regarding development and update of comprehensive plan of care as evidenced by provider attestation and co-signature Inter-disciplinary care team collaboration (see longitudinal plan of care) - careful application of heat or ice encouraged - deep breathing, relaxation and mindfulness use promoted - effectiveness of pharmacologic therapy monitored- 12-07-2020: The muscle relaxer's are helping  -  medication-induced side effects managed - misuse of pain medication assessed - motivation and barriers to change assessed and addressed - mutually acceptable comfort goal set - pain assessed- 12-07-2020: States she is still having a lot of pain in her hands and now her knees. She says the specialist thinks that it is carpal tunnel but is not sure. Is doing testing.  - pain treatment goals reviewed- 12-07-2020: The patient wants to know how to best control her pain and is open to options.  - premedication prior to activity encouraged Evaluation of current treatment plan related to pain and discomfort  and patient's adherence to plan as established by provider. 09-25-2020: The patient is using  topical pain cream. States her hands are "drawing" and it is both hands, sometimes her left is worse. She now is having pain in her knees also. The muscle relaxer's help some. The patient sees the specialist again 01-04-2021. She takes tylenol for pain relief. Heat does not help.  Advised patient to call the office for changes in level or intensity of pain and discomfort Provided education to patient re: alternate pain relief measures. Review of neuropathy pain and if the patient has had any injuries to the area. Several years ago the patient had surgery on her left hand/wrist for carpal tunnel syndrome.  Reviewed medications with patient and discussed compliance.12-07-2020 The patient takes extra strength Tylenol for pain relief and also use cream. She also uses heat at times.  Collaborated with pcp regarding new concern of patients hands feeling like they are "drawing".  The patient states that her left hand is worse than her right. She says she is putting creams on it but that does not help. Ask for recommendations from the pcp. Advised she may need to come in for evaluation by pcp.  Discussed plans with patient for ongoing care management follow up and provided patient with direct contact information for care management  team Allow patient to maintain a diary of pain ratings, timing, precipitating events, medications, treatments, and what works best to relieve pain,  Refer to support groups and self-help groups Educate patient about the use of pharmacological interventions for pain management- antianxiety, antidepressants, NSAIDS, opioid analgesics,  Explain the importance of lifestyle modifications to effective pain management  Patient Goals/Self Care Activities:  - healthy lifestyle promoted - medication side effects managed - multimodal pain management plan developed - pain assessed - response to pain management plan monitored - response to pharmacologic therapy monitored - support and encouragement provided Self-administers medications as prescribed Attends all scheduled provider appointments Calls pharmacy for medication refills Calls provider office for new concerns or questions Follow Up Plan: Telephone follow up appointment with care management team member scheduled for: 02-22-2021 at 345 pm      Task: RNCM: Pain management Completed 12/07/2020  Outcome: Positive  Note:   Care Management Activities:    - healthy lifestyle promoted - medication side effects managed - multimodal pain management plan developed - pain assessed - response to pain management plan monitored - response to pharmacologic therapy monitored - support and encouragement provided         Plan:Telephone follow up appointment with care management team member scheduled for:  02-22-2021 at 345 pm  [place], MSN, [place] Mobile: [phone]

## 2020-12-07 NOTE — Patient Instructions (Signed)
Visit Information  PATIENT GOALS:  Goals Addressed             This Visit's Progress    RNCM: Manage Pain-Osteoarthritis       Timeframe:  Long-Range Goal Priority:  High Start Date:    07-17-2020                         Expected End Date:    10-02-2021                  Follow Up Date 02-22-2021   - call for medicine refill 2 or 3 days before it runs out - develop a personal pain management plan - keep track of prescription refills - plan exercise or activity when pain is best controlled - prioritize tasks for the day - start a pain diary - stay active - track times pain is worst and when it is best - track what makes the pain worse and what makes it better - use ice or heat for pain relief - work slower and less intense when having pain    Why is this important?   Day-to-day life can be hard when you have joint pain.  Pain medicine is just one piece of the treatment puzzle. There are many things you can do to manage pain and stay strong.   Lifestyle changes like stopping smoking and eating foods with Vitamin D and calcium keeps your bones and muscles healthy. Your joints are better when supported by strong muscles.  You can try these action steps to help you manage your pain.     Notes: Pain in back and knees and now in hands and sometimes feet. States: "My hands feel like they are drawing" 09-25-2020: The patient is going to see a specialist for her hands on 10-04-2020. 12-07-2020: The patient has had test and has seen specialist. Is using muscle relaxer's that help. She goes back to see the specialist 01-04-2021. She states she is still having issues with her hands and now in her knees. She denies any acute distress today.         Patient verbalizes understanding of instructions provided today and agrees to view in [place].   Telephone follow up appointment with care management team member scheduled for: 02-22-2021 at 92 pm  Noreene Larsson RN, MSN, [place] Mobile: [phone]

## 2020-12-08 ENCOUNTER — Other Ambulatory Visit: Payer: Self-pay | Admitting: Family Medicine

## 2020-12-08 ENCOUNTER — Other Ambulatory Visit: Payer: Self-pay

## 2020-12-08 DIAGNOSIS — I10 Essential (primary) hypertension: Secondary | ICD-10-CM

## 2020-12-08 DIAGNOSIS — E1169 Type 2 diabetes mellitus with other specified complication: Secondary | ICD-10-CM

## 2020-12-08 DIAGNOSIS — Z0000 Encounter for general adult medical examination without abnormal findings: Secondary | ICD-10-CM

## 2020-12-08 DIAGNOSIS — E785 Hyperlipidemia, unspecified: Secondary | ICD-10-CM

## 2020-12-08 DIAGNOSIS — I5032 Chronic diastolic (congestive) heart failure: Secondary | ICD-10-CM

## 2020-12-11 ENCOUNTER — Telehealth: Payer: Self-pay | Admitting: Pharmacist

## 2020-12-11 ENCOUNTER — Other Ambulatory Visit: Payer: Self-pay

## 2020-12-11 ENCOUNTER — Ambulatory Visit: Payer: Self-pay | Admitting: Pharmacist

## 2020-12-11 DIAGNOSIS — I10 Essential (primary) hypertension: Secondary | ICD-10-CM

## 2020-12-11 DIAGNOSIS — E7800 Pure hypercholesterolemia, unspecified: Secondary | ICD-10-CM

## 2020-12-11 DIAGNOSIS — E1169 Type 2 diabetes mellitus with other specified complication: Secondary | ICD-10-CM

## 2020-12-11 MED ORDER — SIMVASTATIN 20 MG PO TABS
ORAL_TABLET | ORAL | 3 refills | Status: DC
Start: 2020-12-11 — End: 2021-12-17

## 2020-12-11 NOTE — Telephone Encounter (Signed)
Patient requests renewal of simvastatin Rx. Would you please send Rx to [facility]?  Thank you!   Wallace Cullens, PharmD, [place], CPP Clinical Pharmacist [facility] [phone]

## 2020-12-11 NOTE — Patient Instructions (Signed)
Visit Information  PATIENT GOALS:  Goals Addressed             This Visit's Progress    Pharmacy Goals       Our goal A1c is less than 7%. This corresponds with fasting sugars less than 130 and 2 hour after meal sugars less than 180. Please keep a log of your results when checking your blood sugar  Our goal bad cholesterol, or LDL, is less than 70 . This is why it is important to continue taking your simvastatin  Please check your home blood pressure, keep a log of the results and bring this with you to your medical appointments.  Feel free to call me with any questions or concerns. I look forward to our next call!   Wallace Cullens, PharmD, [place], CPP Clinical Pharmacist [facility] [phone]         The patient verbalized understanding of instructions, educational materials, and care plan provided today and declined offer to receive copy of patient instructions, educational materials, and care plan.   Telephone follow up appointment with care management team member scheduled for: 9/7 at 9:15 am

## 2020-12-11 NOTE — Chronic Care Management (AMB) (Signed)
Chronic Care Management Pharmacy Note  12/11/2020 Name:  Diana Harmon MRN:  [id number] DOB:  [birth year]   Subjective: Diana Harmon is an 75 y.o. year old female who is a primary patient of [name], DO.  The CCM team was consulted for assistance with disease management and care coordination needs.    Engaged with patient by telephone for follow up visit in response to provider referral for pharmacy case management and/or care coordination services.   Consent to Services:  The patient was given information about Chronic Care Management services, agreed to services, and gave verbal consent prior to initiation of services.  Please see initial visit note for detailed documentation.   Patient Care Team: [name], DO as PCP - General (Family Medicine) End, Harrell Gave, MD as PCP - Cardiology (Cardiology) Cammie Sickle, MD as Consulting Physician (Internal Medicine) Lorelee Cover., MD (Ophthalmology) End, Harrell Gave, MD as Consulting Physician (Cardiology) Vanita Ingles, RN as Case Manager ([place]) Cammie Sickle, MD as Consulting Physician (Internal Medicine) Curley Spice Virl [name], RPH-CPP as Pharmacist  Recent office visits: None  Hospital visits: None in previous 6 months  Objective:  Lab Results  Component Value Date   CREATININE 1.28 (H) 07/13/2020   CREATININE 1.36 (H) 05/03/2020   CREATININE 1.19 (H) 01/14/2020    Lab Results  Component Value Date   HGBA1C 7.4 10/04/2020   Last diabetic Eye exam:  Lab Results  Component Value Date/Time   HMDIABEYEEXA No Retinopathy 10/19/2020 12:00 AM    Last diabetic Foot exam: No results found for: HMDIABFOOTEX      Component Value Date/Time   CHOL 170 05/03/2020 0935   TRIG 103 05/03/2020 0935   HDL 54 05/03/2020 0935   CHOLHDL 3.1 05/03/2020 0935   CHOLHDL 2.4 07/21/2017 0000   VLDL 26 06/03/2016 0001   LDLCALC 97 05/03/2020 0935    LDLCALC 77 07/21/2017 0000    Hepatic Function Latest Ref Rng & Units 05/03/2020 11/12/2019 11/10/2019  Total Protein 6.0 - 8.5 g/dL 7.3 6.3(L) 7.2  Albumin 3.7 - 4.7 g/dL 3.9 2.5(L) 3.1(L)  AST 0 - 40 IU/L 41(H) 34 45(H)  ALT 0 - 32 IU/L 26 29 33  Alk Phosphatase 44 - 121 IU/L 72 43 54  Total Bilirubin 0.0 - 1.2 mg/dL 0.2 0.6 0.5  Bilirubin, Direct 0.00 - 0.40 mg/dL - - -    Clinical ASCVD: Yes  The 10-year ASCVD risk score Mikey Bussing DC Jr., et al., 2013) is: 30.8%   Values used to calculate the score:     Age: 47 years     Sex: Female     Is Non-Hispanic African American: Yes     Diabetic: Yes     Tobacco smoker: No     Systolic Blood Pressure: 562 mmHg     Is BP treated: Yes     HDL Cholesterol: 54 mg/dL     Total Cholesterol: 170 mg/dL     Social History   Tobacco Use  Smoking Status Former   Years: 20.00   Types: Cigarettes   Quit date: 09/05/1979   Years since quitting: 41.2  Smokeless Tobacco Former   BP Readings from Last 3 Encounters:  11/23/20 (!) 139/57  11/09/20 120/68  09/25/20 (!) 125/58   Pulse Readings from Last 3 Encounters:  11/23/20 85  11/09/20 78  07/24/20 81   Wt Readings from Last 3 Encounters:  11/23/20 230 lb 9.6 oz (104.6 kg)  11/09/20 230  lb 12.8 oz (104.7 kg)  09/23/20 231 lb (104.8 kg)    Assessment: Review of patient past medical history, allergies, medications, health status, including review of consultants reports, laboratory and other test data, was performed as part of comprehensive evaluation and provision of chronic care management services.   SDOH:  (Social Determinants of Health) assessments and interventions performed: none   CCM Care Plan  Allergies  Allergen Reactions   Ferumoxytol Swelling    Swelling in neck   Iron    Other     Other reaction(s): Other (See Comments) She refuses blood products-Jehovah witness   Penicillins Rash, Swelling and Hives    Medications Reviewed Today     Reviewed by Rennis Petty, RPH-CPP (Pharmacist) on 12/11/20 at 1013  Med List Status: <None>   Medication Order Taking? Sig Documenting Provider Last Dose Status Informant  ACCU-CHEK GUIDE test strip [id number]  Use to check blood sugar twice daily [name], DO  Active   Accu-Chek Softclix Lancets lancets [id number]  Use to check blood sugar twice daily [name], DO  Active   acetaminophen (TYLENOL) 500 MG tablet [id number]  Take 1,000 mg by mouth every 6 (six) hours as needed for moderate pain.  [provider]  Active Child  Artificial Tear Solution (GENTEAL TEARS) 0.1-0.2-0.3 % SOLN [id number]  Place 1 drop into both eyes 3 (three) times daily as needed (dry/irritated eyes.). [provider]  Active Child  aspirin 81 MG chewable tablet [id number]  Chew 1 tablet (81 mg total) by mouth daily. [name]  Active Child  B COMPLEX-C PO [id number]  Take 10 mLs by mouth daily. [provider]  Active Child  baclofen (LIORESAL) 10 MG tablet [id number]  TAKE 1 TO 2 TABLETS BY MOUTH TWICE DAILY AS NEEDED FOR MUSCLE SPASM [place], Devonne Doughty, DO  Active Child  Blood Glucose Monitoring Suppl (ACCU-CHEK GUIDE) w/Device KIT [id number]  Use to check blood sugar twice daily [name], DO  Active   Cholecalciferol (VITAMIN D3) 125 MCG (5000 UT) TABS [id number]  Take 5,000 Units by mouth daily. [provider]  Active Child    Discontinued 09/05/19 1316   Ferrous Sulfate (IRON) 325 (65 Fe) MG TABS [id number]  TAKE 1 TABLET BY MOUTH TWICE DAILY WITH A MEAL [name], Devonne Doughty, DO  Active Child  furosemide (LASIX) 20 MG tablet [id number] Yes Take 1 tablet (20 mg total) by mouth daily as needed for edema. End, Harrell Gave, MD Taking Active Child  hydrochlorothiazide (HYDRODIURIL) 25 MG tablet [id number] Yes Take 1 tablet (25 mg total) by mouth daily. End, Harrell Gave, MD Taking Active   lactobacillus acidophilus (BACID) TABS tablet [id number]  Take  2 tablets by mouth 2 (two) times daily as needed (digestive health.). [provider]  Active            Med Note Vianne Bulls Nov 03, 2019  3:06 PM)    levocetirizine (XYZAL) 5 MG tablet [id number] Yes TAKE 1 TABLET BY MOUTH ONCE DAILY IN THE EVENING  Patient taking differently: Take 1/2 tablet (2.5 mg) daily in the evening as needed for allergies   [name], DO Taking Active   losartan (COZAAR) 50 MG tablet [id number] Yes Take 1 tablet (50 mg total) by mouth daily. End, Harrell Gave, MD Taking Active   magnesium oxide (MAG-OX) 400 MG tablet [id number]  Take 400 mg by mouth daily. [provider]  Active   [facility] injection  [id number]  Inject 0.5 mL into muscle for shingles vaccine. Repeat dose in 2-6 months. [name], DO  Active   simvastatin (ZOCOR) 20 MG tablet [id number] Yes TAKE 1 TABLET BY MOUTH ONCE DAILY AT  6  PM [name], Devonne Doughty, DO Taking Active   TRULICITY 5.02 DX/4.1OI SOPN [id number] Yes Inject 0.75 mg into the skin once a week. [name], DO Taking Active             Patient Active Problem List   Diagnosis Date Noted   S/P TAVR (transcatheter aortic valve replacement) 11/09/2019   Patient is [name]    Severe aortic stenosis    Thyroid disease    Chronic heart failure with preserved ejection fraction (HFpEF) ([place]) 09/30/2019   Normocytic anemia 02/17/2019   Morbid obesity ([place]) 11/05/2017   Neutropenia ([place]) 04/24/2017   Vitamin D deficiency 10/08/2016   Meniere's disease of right ear 07/04/2016   TIA (transient ischemic attack) 07/04/2016   Nonrheumatic aortic valve stenosis 07/04/2016   Seasonal allergies 01/30/2016   Recurrent UTI 10/26/2015   Iron deficiency anemia due to chronic blood loss 02/28/2015   History of adenomatous polyp of colon 02/13/2015   Osteoarthritis of both knees 12/05/2014   Acid reflux 12/05/2014   Benign neoplasm of colon 12/05/2014   Type 2 diabetes  mellitus with other specified complication ([place]) 47/01/6282   Essential hypertension 12/05/2014   Hyperlipidemia associated with type 2 diabetes mellitus ([place]) 12/05/2014   Arthritis, degenerative 12/05/2014   Anemia due to multiple mechanisms 12/05/2014   Abnormal liver enzymes 12/05/2014    Immunization History  Administered Date(s) Administered   Fluad Quad(high Dose 65+) 01/12/2019, 02/11/2020   Influenza, High Dose Seasonal PF 02/09/2014, 01/05/2015, 01/30/2016, 01/14/2017   Influenza,inj,Quad PF,6+ Mos 04/05/2013   Influenza-Unspecified 02/03/2014, 02/03/2018   Moderna Sars-Covid-2 Vaccination 09/25/2019, 10/23/2019, 04/23/2020   Pneumococcal Conjugate-13 08/05/2014   Pneumococcal Polysaccharide-23 04/05/2013    Conditions to be addressed/monitored: HTN, HLD, and DMII  Care Plan : PharmD - Medication Assistance/Management  Updates made by Rennis Petty, RPH-CPP since 12/11/2020 12:00 AM     Problem: Disease Progression      Long-Range Goal: Disease Progression Prevented or Minimized   Start Date: 10/30/2020  Expected End Date: 01/28/2021  This Visit's Progress: On track  Recent Progress: On track  Priority: High  Note:   Current Barriers:  Unable to independently afford treatment regimen Patient APPROVED for [facility] Patient Assistance Program for Trulicity, eligible from 11/21/20-05/05/21  Pharmacist Clinical Goal(s):  Over the next 90 days, patient will verbalize ability to afford treatment regimen through collaboration with PharmD and provider.   Interventions: 1:1 collaboration with [name], DO regarding development and update of comprehensive plan of care as evidenced by provider attestation and co-signature Inter-disciplinary care team collaboration (see longitudinal plan of care) Perform chart review. Patient seen for Office Visit with PCP on 7/21 for diabetes follow up Patient provided with Rx for Shingrix vaccine Reports contacted [facility] and plans to receive 1st dose of Shingrix vaccine this week Renal dose adjustment:  Patient currently takes levocetirizine 5 mg QHS as needed for allergy symptoms Creatinine lab work, calculate CrCl (based on adjusted body weight): ~49 mL/min Based on renal function, advise patient to decrease levocetirizine dose to 1/2 tablet (2.5 mg) daily in evening as needed for allergy symptoms  T2DM: Current treatment: Trulicity 6.62 mg weekly on Saturdays Previous therapies: metformin Reports recent home blood sugars readings: 8/1: 148 8/2: 125  8/3: 115 8/4: 146 Current dietary patterns: breakfast: an egg and slice of rye toast; lunch: salad and/or [place] sandwich with lettuce and tomato; supper: fish and 1/2 baked potato and coleslaw; drinks: water and cup of coffee with breakfast Reports working on dietary changes: Limiting portion sizes Working on drinking more water, but occasionally has ginger ale (has cut back) Trying to eat more salads by chopping up ingredients (difficult if not chopped due to dental limitations) Reports using blood sugar readings to reinforce dietary changes Cholesterol management: simvastatin 20 mg daily Identify simvastatin Rx currently out of refills.  Send message to clinical team requesting renewal Exercise Reports exercise limited by arthritis.  Reports misses going to the pool, but stopped going due to COVID-19 pandemic Reports uses upright exercise bike at home some depending on her arthritis   HTN: Current treatment: HCTZ 25 mg daily Losartan 50 mg daily Furosemide 20 mg daily as needed for edema Reports recent home BP readings: 7/26: 115/45, HR 81 7/23: 108/46, HR 81 Denies symptoms unless takes positional changes quickly Encourage patient to monitor daily weights and follow direction from Cardiology for taking furosemide as needed  Reports also uses compression socks and elevates legs to help with swelling Encourage patient to bring record  of daily weights and home BP readings with her to medical appointments   Medication Assistance: Counsel on process for ordering refill of Trulicity from patient assistance program   Patient Goals/Self-Care Activities Over the next 90 days, patient will:  - take medications as prescribed - check glucose, document, and provide at future appointments - check blood pressure, document, and provide at future appointments - collaborate with provider on medication access solutions - attend medical appointments as scheduled  Next appointment with [facility] 8/9 Next appointment with [facility] on 9/1  Follow Up Plan: Telephone follow up appointment with care management team member scheduled for: 9/7 at 9:15 am      Medication Assistance: Patient APPROVED for [facility] Patient Assistance Program for Trulicity, eligible from 11/21/20-05/05/21  Patient's preferred pharmacy is:  [facility] [place] - [address] [place] [address] Phone: [phone] Fax: [phone]  [email or web address] - [place], [place] - [phone] [facility] [address] [facility] [address] [place] [address] Phone: [phone] Fax: [phone]   Follow Up:  Patient agrees to Care Plan and Follow-up.  Wallace Cullens, PharmD, [place], CPP Clinical Pharmacist [facility] [phone]

## 2020-12-11 NOTE — Telephone Encounter (Signed)
Sent to [facility] in [place].

## 2020-12-12 DIAGNOSIS — N189 Chronic kidney disease, unspecified: Secondary | ICD-10-CM | POA: Diagnosis not present

## 2020-12-20 ENCOUNTER — Telehealth: Payer: Self-pay | Admitting: Internal Medicine

## 2020-12-20 NOTE — Telephone Encounter (Signed)
Pt c/o of Chest Pain: STAT if CP now or developed within 24 hours  1. Are you having CP right now? Yes, tight  2. Are you experiencing any other symptoms (ex. SOB, nausea, vomiting, sweating)? No, just some diarrhea  3. How long have you been experiencing CP? From about 6:30a  4. Is your CP continuous or coming and going? Continuous   5. Have you taken Nitroglycerin? No, doesn't have any  ?

## 2020-12-20 NOTE — Telephone Encounter (Signed)
Call transferred directly to this RN from scheduling when the patient called in this morning.  The patient reports that: -  when she woke up ~ 6:30 am she was having right sided chest pain/ pain under the arm (8/10 pain) - she does have some right sides neck pain that is not made worse with positional changes - she did have an episode of diarrhea this morning - she has not taken anything for discomfort such as tylenol/ ibuprofen - her pain is currently (5/10) - the pain does become a little worse when she raises her arm - her chest discomfort is not worse with pressing on the affected area - she denies any SOB at this time  The patient does not have a history of CAD. She is s/p TAVR from 11/09/19.   I have confirmed with the patient that she has not had any change in her activity over the last few days-- no pushing/ pulling/ lifting. She does sleep on her right side.  The patient wanting to know if Dr. Saunders Revel can see her today. I have advised her I will review her symptoms with Dr. Saunders Revel and call her back.  The patient voices understanding and is agreeable.   Her daughter is currently with her and was on the line as well.

## 2020-12-20 NOTE — Telephone Encounter (Signed)
I reviewed the patient's symptoms with Dr. Saunders Revel. Per Dr. Saunders Revel, the patient had a cath 10/2019 that did not show any obstructive CAD. He did not feel her TAVR would cause any acute chest pain. He advised that the patient should take tylenol and wait 1 hour. If symptoms do not improve, she can call back to the office to be added on at 3:20 pm to his schedule.   I have called the patient back, her daughter was also on the line, and advised her of  Dr. Darnelle Bos recommendations to take tylenol now and see how she is in 1 hour. I have advised her to call back only if symptoms do not improve and we will add her on to his schedule this afternoon at 3:20 pm. Otherwise, if symptoms improve, she does not need to call back. The patient and her daughter are aware I will told a hold off the 3:20 pm slot at 9:50 am.   They voice understanding of the above recommendations and are agreeable. They were very appreciative for the call back.

## 2021-01-03 DIAGNOSIS — M19041 Primary osteoarthritis, right hand: Secondary | ICD-10-CM

## 2021-01-03 DIAGNOSIS — M19042 Primary osteoarthritis, left hand: Secondary | ICD-10-CM | POA: Diagnosis not present

## 2021-01-03 DIAGNOSIS — I5032 Chronic diastolic (congestive) heart failure: Secondary | ICD-10-CM | POA: Diagnosis not present

## 2021-01-03 DIAGNOSIS — I10 Essential (primary) hypertension: Secondary | ICD-10-CM

## 2021-01-03 DIAGNOSIS — E1169 Type 2 diabetes mellitus with other specified complication: Secondary | ICD-10-CM | POA: Diagnosis not present

## 2021-01-03 DIAGNOSIS — E785 Hyperlipidemia, unspecified: Secondary | ICD-10-CM

## 2021-01-04 DIAGNOSIS — R768 Other specified abnormal immunological findings in serum: Secondary | ICD-10-CM | POA: Diagnosis not present

## 2021-01-04 DIAGNOSIS — M542 Cervicalgia: Secondary | ICD-10-CM | POA: Diagnosis not present

## 2021-01-04 DIAGNOSIS — G5603 Carpal tunnel syndrome, bilateral upper limbs: Secondary | ICD-10-CM | POA: Diagnosis not present

## 2021-01-10 ENCOUNTER — Ambulatory Visit (INDEPENDENT_AMBULATORY_CARE_PROVIDER_SITE_OTHER): Payer: Medicare Other | Admitting: Pharmacist

## 2021-01-10 DIAGNOSIS — E1169 Type 2 diabetes mellitus with other specified complication: Secondary | ICD-10-CM

## 2021-01-10 DIAGNOSIS — I10 Essential (primary) hypertension: Secondary | ICD-10-CM

## 2021-01-10 NOTE — Chronic Care Management (AMB) (Signed)
Chronic Care Management Pharmacy Note  01/10/2021 Name:  Diana Harmon MRN:  [id number] DOB:  15-Dec-1945   Subjective: Diana Harmon is an 75 y.o. year old female who is a primary patient of [name], DO.  The CCM team was consulted for assistance with disease management and care coordination needs.    Engaged with patient by telephone for follow up visit in response to provider referral for pharmacy case management and/or care coordination services.   Consent to Services:  The patient was given information about Chronic Care Management services, agreed to services, and gave verbal consent prior to initiation of services.  Please see initial visit note for detailed documentation.   Patient Care Team: [name], DO as PCP - General (Family Medicine) End, Harrell Gave, MD as PCP - Cardiology (Cardiology) Cammie Sickle, MD as Consulting Physician (Internal Medicine) Lorelee Cover., MD (Ophthalmology) End, Harrell Gave, MD as Consulting Physician (Cardiology) Vanita Ingles, RN as Case Manager ([place]) Cammie Sickle, MD as Consulting Physician (Internal Medicine) Curley Spice Virl [name], RPH-CPP as Pharmacist  Recent consult visits: Office Visit with [facility] on 8/9 for CKD Office Visit with [facility] on [facility] visits: None in previous 6 months  Objective:  Lab Results  Component Value Date   CREATININE 1.28 (H) 07/13/2020   CREATININE 1.36 (H) 05/03/2020   CREATININE 1.19 (H) 01/14/2020    Lab Results  Component Value Date   HGBA1C 7.4 10/04/2020   Last diabetic Eye exam:  Lab Results  Component Value Date/Time   HMDIABEYEEXA No Retinopathy 10/19/2020 12:00 AM    Last diabetic Foot exam: No results found for: HMDIABFOOTEX      Component Value Date/Time   CHOL 170 05/03/2020 0935   TRIG 103 05/03/2020 0935   HDL 54 05/03/2020 0935   CHOLHDL 3.1  05/03/2020 0935   CHOLHDL 2.4 07/21/2017 0000   VLDL 26 06/03/2016 0001   LDLCALC 97 05/03/2020 0935   LDLCALC 77 07/21/2017 0000    Hepatic Function Latest Ref Rng & Units 05/03/2020 11/12/2019 11/10/2019  Total Protein 6.0 - 8.5 g/dL 7.3 6.3(L) 7.2  Albumin 3.7 - 4.7 g/dL 3.9 2.5(L) 3.1(L)  AST 0 - 40 IU/L 41(H) 34 45(H)  ALT 0 - 32 IU/L 26 29 33  Alk Phosphatase 44 - 121 IU/L 72 43 54  Total Bilirubin 0.0 - 1.2 mg/dL 0.2 0.6 0.5  Bilirubin, Direct 0.00 - 0.40 mg/dL - - -    Clinical ASCVD: Yes  The 10-year ASCVD risk score Mikey Bussing DC Jr., et al., 2013) is: 29.3%   Values used to calculate the score:     Age: 56 years     Sex: Female     Is Non-Hispanic African American: Yes     Diabetic: Yes     Tobacco smoker: No     Systolic Blood Pressure: 381 mmHg     Is BP treated: Yes     HDL Cholesterol: 54 mg/dL     Total Cholesterol: 170 mg/dL    Social History   Tobacco Use  Smoking Status Former   Years: 20.00   Types: Cigarettes   Quit date: 09/05/1979   Years since quitting: 41.3  Smokeless Tobacco Former   BP Readings from Last 3 Encounters:  11/23/20 (!) 139/57  11/09/20 120/68  09/25/20 (!) 125/58   Pulse Readings from Last 3 Encounters:  11/23/20 85  11/09/20 78  07/24/20 81  Wt Readings from Last 3 Encounters:  11/23/20 230 lb 9.6 oz (104.6 kg)  11/09/20 230 lb 12.8 oz (104.7 kg)  09/23/20 231 lb (104.8 kg)    Assessment: Review of patient past medical history, allergies, medications, health status, including review of consultants reports, laboratory and other test data, was performed as part of comprehensive evaluation and provision of chronic care management services.   SDOH:  (Social Determinants of Health) assessments and interventions performed:    CCM Care Plan  Allergies  Allergen Reactions   Ferumoxytol Swelling    Swelling in neck   Iron    Other     Other reaction(s): Other (See Comments) She refuses blood products-Jehovah witness    Penicillins Rash, Swelling and Hives    Medications Reviewed Today     Reviewed by Rennis Petty, RPH-CPP (Pharmacist) on 01/10/21 at 51  Med List Status: <None>   Medication Order Taking? Sig Documenting Provider Last Dose Status Informant  ACCU-CHEK GUIDE test strip [id number]  Use to check blood sugar twice daily [name], DO  Active   Accu-Chek Softclix Lancets lancets [id number]  Use to check blood sugar twice daily [name], DO  Active   acetaminophen (TYLENOL) 500 MG tablet [id number]  Take 1,000 mg by mouth every 6 (six) hours as needed for moderate pain.  [provider]  Active Child  Artificial Tear Solution (GENTEAL TEARS) 0.1-0.2-0.3 % SOLN [id number]  Place 1 drop into both eyes 3 (three) times daily as needed (dry/irritated eyes.). [provider]  Active Child  aspirin 81 MG chewable tablet [id number]  Chew 1 tablet (81 mg total) by mouth daily. [name]  Active Child  B COMPLEX-C PO [id number]  Take 10 mLs by mouth daily. [provider]  Active Child  baclofen (LIORESAL) 10 MG tablet [id number]  TAKE 1 TO 2 TABLETS BY MOUTH TWICE DAILY AS NEEDED FOR MUSCLE SPASM Parks Ranger, Devonne Doughty, DO  Active Child  Blood Glucose Monitoring Suppl (ACCU-CHEK GUIDE) w/Device KIT [id number]  Use to check blood sugar twice daily [name], DO  Active   Cholecalciferol (VITAMIN D3) 125 MCG (5000 UT) TABS [id number]  Take 5,000 Units by mouth daily. [provider]  Active Child    Discontinued 09/05/19 1316   Ferrous Sulfate (IRON) 325 (65 Fe) MG TABS [id number]  TAKE 1 TABLET BY MOUTH TWICE DAILY WITH A MEAL [name], Devonne Doughty, DO  Active Child  furosemide (LASIX) 20 MG tablet [id number] Yes Take 1 tablet (20 mg total) by mouth daily as needed for edema. End, Harrell Gave, MD Taking Active Child  hydrochlorothiazide (HYDRODIURIL) 25 MG tablet [id number] Yes Take 1 tablet (25 mg total) by mouth  daily. End, Harrell Gave, MD Taking Active   lactobacillus acidophilus (BACID) TABS tablet [id number]  Take 2 tablets by mouth 2 (two) times daily as needed (digestive health.). [provider]  Active            Med Note Vianne Bulls Nov 03, 2019  3:06 PM)    levocetirizine (XYZAL) 5 MG tablet [id number] Yes TAKE 1 TABLET BY MOUTH ONCE DAILY IN THE EVENING  Patient taking differently: Take 1/2 tablet (2.5 mg) daily in the evening as needed for allergies   [name], DO Taking Active   losartan (COZAAR) 50 MG tablet [id number]  Take 1 tablet (50 mg total) by mouth daily. End, Harrell Gave, MD  Active   magnesium oxide (MAG-OX) 400 MG tablet  [id number]  Take 400 mg by mouth daily. [provider]  Active   [facility] injection [id number]  Inject 0.5 mL into muscle for shingles vaccine. Repeat dose in 2-6 months. [name], DO  Active   simvastatin (ZOCOR) 20 MG tablet [id number]  TAKE 1 TABLET BY MOUTH ONCE DAILY AT  6  PM [name], Devonne Doughty, DO  Active   TRULICITY 8.78 MV/6.7MC SOPN [id number] Yes Inject 0.75 mg into the skin once a week. [name], DO Taking Active             Patient Active Problem List   Diagnosis Date Noted   S/P TAVR (transcatheter aortic valve replacement) 11/09/2019   Patient is [name]    Severe aortic stenosis    Thyroid disease    Chronic heart failure with preserved ejection fraction (HFpEF) ([place]) 09/30/2019   Normocytic anemia 02/17/2019   Morbid obesity ([place]) 11/05/2017   Neutropenia ([place]) 04/24/2017   Vitamin D deficiency 10/08/2016   Meniere's disease of right ear 07/04/2016   TIA (transient ischemic attack) 07/04/2016   Nonrheumatic aortic valve stenosis 07/04/2016   Seasonal allergies 01/30/2016   Recurrent UTI 10/26/2015   Iron deficiency anemia due to chronic blood loss 02/28/2015   History of adenomatous polyp of colon 02/13/2015   Osteoarthritis of both knees  12/05/2014   Acid reflux 12/05/2014   Benign neoplasm of colon 12/05/2014   Type 2 diabetes mellitus with other specified complication ([place]) 66/29/4765   Essential hypertension 12/05/2014   Hyperlipidemia associated with type 2 diabetes mellitus ([place]) 12/05/2014   Arthritis, degenerative 12/05/2014   Anemia due to multiple mechanisms 12/05/2014   Abnormal liver enzymes 12/05/2014    Immunization History  Administered Date(s) Administered   Fluad Quad(high Dose 65+) 01/12/2019, 02/11/2020   Influenza, High Dose Seasonal PF 02/09/2014, 01/05/2015, 01/30/2016, 01/14/2017   Influenza,inj,Quad PF,6+ Mos 04/05/2013   Influenza-Unspecified 02/03/2014, 02/03/2018   Moderna Sars-Covid-2 Vaccination 09/25/2019, 10/23/2019, 04/23/2020   Pneumococcal Conjugate-13 08/05/2014   Pneumococcal Polysaccharide-23 04/05/2013    Conditions to be addressed/monitored: HTN, HLD, and DMII  Care Plan : PharmD - Medication Assistance/Management  Updates made by Rennis Petty, RPH-CPP since 01/10/2021 12:00 AM     Problem: Disease Progression      Long-Range Goal: Disease Progression Prevented or Minimized   Start Date: 10/30/2020  Expected End Date: 01/28/2021  This Visit's Progress: On track  Recent Progress: On track  Priority: High  Note:   Current Barriers:  Unable to independently afford treatment regimen Patient APPROVED for [facility] Patient Assistance Program for Trulicity, eligible from 11/21/20-05/05/21  Pharmacist Clinical Goal(s):  Over the next 90 days, patient will verbalize ability to afford treatment regimen through collaboration with PharmD and provider.   Interventions: 1:1 collaboration with [name], DO regarding development and update of comprehensive plan of care as evidenced by provider attestation and co-signature Inter-disciplinary care team collaboration (see longitudinal plan of care) Perform chart review. Office Visit with [facility] on  8/9 for CKD. Provider advised patient to: Begin restricting salt intake to 2 grams/day Consider meeting with a dietician to discuss loosing weight and helping control diabetes Return to clinic in 3 months Per telephone encounter on 8/17, patient contacted [facility] regarding chest pain Office Visit with [facility] Neurology on 9/1 Consider follow-up with orthopedics to discuss chronic knee pain  Continue to ambulate with walker to reduce risk of falls  Referral placed to Rheumatology Order placed for home health consult  with physical therapy  Reports on 8/17 chest pain resolved after taking Tylenol and resting as suggested by Cardiologist. Patient suspects that she pulled a muscle on her right side, but says resolved now Reports received first dose of Shingrix vaccine and scheduled for 2nd dose  T2DM: Current treatment: Trulicity 3.83 mg weekly on Saturdays Previous therapies: metformin Reports recent home blood sugars readings primarily ranging: 118-123, but did have a reading of 153 after eating breaded fish Discuss dietary changes including having regular well balanced meals and limiting carbohydrate portion sizes Reports has been working on limiting portion sizes and cutting out snacking around bedtime Note dietary options have been limited by dental concerns, but reports going to see dentist on 9/9 for partial denture Denies interest in seeing a dietitian at this time Cholesterol management: simvastatin 20 mg daily Exercise Reports exercise limited by arthritis.  Starting yoga with her daughter Reported misses going to the pool, but stopped going due to COVID-19 pandemic Reported uses upright exercise bike at home some depending on her arthritis   HTN: Current treatment: HCTZ 25 mg daily Losartan 50 mg daily Furosemide 20 mg daily as needed for edema Reports recent home BP readings: Yesterday: 118/54 Reports working on reducing salt/sodium in diet as  recommended by Nephrologist Patient currently using wrist monitor.  Encourage patient to consider obtaining upper arm blood pressure monitor for greater accuracy Counsel on using Part D health plan OTC benefit for obtaining monitor Counsel on importance of obtaining correct cuff size. Patient confirms will order a size large cuff Encourage patient to monitor daily weights and follow direction from Cardiology for taking furosemide as needed  Reports also uses compression socks and elevates legs to help with swelling Encouraged patient to bring record of daily weights and home BP readings with her to medical appointments   Medication Assistance: Schedule appointment for re-enrollment in patient assistance program for Trulicity   Patient Goals/Self-Care Activities Over the next 90 days, patient will:  - take medications as prescribed - check glucose, document, and provide at future appointments - check blood pressure, document, and provide at future appointments - collaborate with provider on medication access solutions - attend medical appointments as scheduled  Next appointment with [facility] Nephrology 8/9 Next appointment with [facility] on 9/1  Follow Up Plan: Telephone follow up appointment with care management team member scheduled for: 03/14/2021 at 9:15 AM       Patient's preferred pharmacy is:  [facility] [address] [place] - [place] [address] Phone: [phone] Fax: [phone]  [email or web address] - [place], [place] - [phone] [address] [facility] [address] [place] [address] Phone: [phone] Fax: [phone]   Follow Up:  Patient agrees to Care Plan and Follow-up.  Wallace Cullens, PharmD, [place], CPP Clinical Pharmacist [facility] [phone]

## 2021-01-10 NOTE — Patient Instructions (Signed)
Visit Information  PATIENT GOALS:  Goals Addressed             This Visit's Progress    Pharmacy Goals       Our goal A1c is less than 7%. This corresponds with fasting sugars less than 130 and 2 hour after meal sugars less than 180. Please keep a log of your results when checking your blood sugar  Our goal bad cholesterol, or LDL, is less than 70 . This is why it is important to continue taking your simvastatin  Please check your home blood pressure, keep a log of the results and bring this with you to your medical appointments.  Feel free to call me with any questions or concerns. I look forward to our next call!    Wallace Cullens, PharmD, Para [place], CPP Clinical Pharmacist [facility] [phone]         The patient verbalized understanding of instructions, educational materials, and care plan provided today and declined offer to receive copy of patient instructions, educational materials, and care plan.   Telephone follow up appointment with care management team member scheduled for: 03/14/2021 at 9:15 AM

## 2021-01-11 ENCOUNTER — Inpatient Hospital Stay: Payer: Medicare Other | Attending: Internal Medicine

## 2021-01-11 ENCOUNTER — Inpatient Hospital Stay: Payer: Medicare Other | Admitting: Internal Medicine

## 2021-01-11 ENCOUNTER — Other Ambulatory Visit: Payer: Self-pay

## 2021-01-11 DIAGNOSIS — D72819 Decreased white blood cell count, unspecified: Secondary | ICD-10-CM | POA: Insufficient documentation

## 2021-01-11 DIAGNOSIS — Z7982 Long term (current) use of aspirin: Secondary | ICD-10-CM | POA: Insufficient documentation

## 2021-01-11 DIAGNOSIS — D509 Iron deficiency anemia, unspecified: Secondary | ICD-10-CM | POA: Insufficient documentation

## 2021-01-11 DIAGNOSIS — I129 Hypertensive chronic kidney disease with stage 1 through stage 4 chronic kidney disease, or unspecified chronic kidney disease: Secondary | ICD-10-CM | POA: Insufficient documentation

## 2021-01-11 DIAGNOSIS — D649 Anemia, unspecified: Secondary | ICD-10-CM | POA: Diagnosis not present

## 2021-01-11 DIAGNOSIS — Z79899 Other long term (current) drug therapy: Secondary | ICD-10-CM | POA: Diagnosis not present

## 2021-01-11 DIAGNOSIS — E538 Deficiency of other specified B group vitamins: Secondary | ICD-10-CM | POA: Diagnosis not present

## 2021-01-11 DIAGNOSIS — E079 Disorder of thyroid, unspecified: Secondary | ICD-10-CM | POA: Diagnosis not present

## 2021-01-11 DIAGNOSIS — E119 Type 2 diabetes mellitus without complications: Secondary | ICD-10-CM | POA: Insufficient documentation

## 2021-01-11 DIAGNOSIS — N183 Chronic kidney disease, stage 3 unspecified: Secondary | ICD-10-CM | POA: Diagnosis not present

## 2021-01-11 DIAGNOSIS — Z87891 Personal history of nicotine dependence: Secondary | ICD-10-CM | POA: Insufficient documentation

## 2021-01-11 DIAGNOSIS — D500 Iron deficiency anemia secondary to blood loss (chronic): Secondary | ICD-10-CM

## 2021-01-11 DIAGNOSIS — Z803 Family history of malignant neoplasm of breast: Secondary | ICD-10-CM | POA: Insufficient documentation

## 2021-01-11 DIAGNOSIS — I350 Nonrheumatic aortic (valve) stenosis: Secondary | ICD-10-CM | POA: Diagnosis not present

## 2021-01-11 LAB — CBC WITH DIFFERENTIAL/PLATELET
Abs Immature Granulocytes: 0 10*3/uL (ref 0.00–0.07)
Basophils Absolute: 0 10*3/uL (ref 0.0–0.1)
Basophils Relative: 1 %
Eosinophils Absolute: 0.2 10*3/uL (ref 0.0–0.5)
Eosinophils Relative: 5 %
HCT: 31.2 % — ABNORMAL LOW (ref 36.0–46.0)
Hemoglobin: 9.9 g/dL — ABNORMAL LOW (ref 12.0–15.0)
Immature Granulocytes: 0 %
Lymphocytes Relative: 50 %
Lymphs Abs: 1.8 10*3/uL (ref 0.7–4.0)
MCH: 30.3 pg (ref 26.0–34.0)
MCHC: 31.7 g/dL (ref 30.0–36.0)
MCV: 95.4 fL (ref 80.0–100.0)
Monocytes Absolute: 0.4 10*3/uL (ref 0.1–1.0)
Monocytes Relative: 11 %
Neutro Abs: 1.2 10*3/uL — ABNORMAL LOW (ref 1.7–7.7)
Neutrophils Relative %: 33 %
Platelets: 191 10*3/uL (ref 150–400)
RBC: 3.27 MIL/uL — ABNORMAL LOW (ref 3.87–5.11)
RDW: 13.2 % (ref 11.5–15.5)
WBC: 3.5 10*3/uL — ABNORMAL LOW (ref 4.0–10.5)
nRBC: 0 % (ref 0.0–0.2)

## 2021-01-11 LAB — IRON AND TIBC
Iron: 100 ug/dL (ref 28–170)
Saturation Ratios: 32 % — ABNORMAL HIGH (ref 10.4–31.8)
TIBC: 312 ug/dL (ref 250–450)
UIBC: 212 ug/dL

## 2021-01-11 LAB — BASIC METABOLIC PANEL
Anion gap: 7 (ref 5–15)
BUN: 29 mg/dL — ABNORMAL HIGH (ref 8–23)
CO2: 27 mmol/L (ref 22–32)
Calcium: 9 mg/dL (ref 8.9–10.3)
Chloride: 103 mmol/L (ref 98–111)
Creatinine, Ser: 1.04 mg/dL — ABNORMAL HIGH (ref 0.44–1.00)
GFR, Estimated: 56 mL/min — ABNORMAL LOW (ref >60)
Glucose, Bld: 150 mg/dL — ABNORMAL HIGH (ref 70–99)
Potassium: 3.9 mmol/L (ref 3.5–5.1)
Sodium: 137 mmol/L (ref 135–145)

## 2021-01-11 LAB — VITAMIN B12: Vitamin B-12: 5170 pg/mL — ABNORMAL HIGH (ref 180–914)

## 2021-01-11 LAB — FERRITIN: Ferritin: 196 ng/mL (ref 11–307)

## 2021-01-11 NOTE — Assessment & Plan Note (Addendum)
#  Normocytic anemia unclear etiology; previous history of AVM iron deficiency; September 2020-iron studies negative for deficiency.  Bone marrow biopsy negative for any significant dyspoiesis.  Cytogenetics normal.  #Today hemoglobin is 9.9 STABLE- stable.   Slightly low; otherwise fairly asymptomatic.Patient is on p.o. iron.                            #White count 3.5 chronic benign ethnic neutropenia-neutrophil count 1.6- STABLE.   # Relative B12 deficiency-200; March 2021 -normal continue sublingual B12 tablets.   #Chronic kidney disease-stage III mild [[facility] Nephrology-[facility]]; reviewed the note.  # DISPOSITION: # in 6 months-MD; labs- cbc/bmp/ iron studies/ferritin/ b12---Dr.B

## 2021-01-11 NOTE — Progress Notes (Signed)
[place] NOTE  Patient Care Team: [name], DO as PCP - General (Family Medicine) End, Harrell Gave, MD as PCP - Cardiology (Cardiology) Cammie Sickle, MD as Consulting Physician (Internal Medicine) Lorelee Cover., MD (Ophthalmology) End, Harrell Gave, MD as Consulting Physician (Cardiology) Vanita Ingles, RN as Case Manager (General Practice) Cammie Sickle, MD as Consulting Physician (Internal Medicine) Curley Spice Virl [name], RPH-CPP as Pharmacist  CHIEF COMPLAINTS/PURPOSE OF CONSULTATION:   # AUG 2016- IRON DEFICIENCY ANEMIA-  Possible AVMs of colon [s/p Argon laser; Dr.Elliot;EGD-neg Nov 2016] s/p IV Ferriheme x2 [[id number]; ALLERGY]; Venofer IV [pre-meds]; worsening anemia not responding to iron- November 2020-bone marrow biopsy-erythroid hyperplasia but no dysplasia no blasts. NOV 2020- CT A/P-negative; CXR-NEG.  #Relative B12 deficiency-sublingual B12 [Jan 2021]  #December 2020 -elevated anti-CCP- [s/p evaluation Dr. Elder Negus  # MILD LEUCOPENIA/NEUTROPENIA [facility] 1.2-1.3]-chronic ethnic neutropenia/asymptomatic  # Rectal well diff carcinoid [1.2CM; s/p polypectomy 2011]; Allergy- [facility] [Nov 2016]- neck/face swelling [improved with benadryl]; June 2021- TAVR for severe AS  HISTORY OF PRESENTING ILLNESS: Patient accompanied by daughter.  In a wheelchair. Gizell Danser Gilberto 75 y.o.  female is here for follow-up for anemia of unclear etiology is here for follow-up.  No new shortness of breath no cough.  No weight loss no blood in stools or black or stools.  Review of Systems  Constitutional:  Negative for chills, diaphoresis, fever and weight loss.  HENT:  Negative for nosebleeds and sore throat.   Eyes:  Negative for double vision.  Respiratory:  Negative for cough, hemoptysis, sputum production, shortness of breath and wheezing.   Cardiovascular:  Negative for chest pain, palpitations, orthopnea and leg  swelling.  Gastrointestinal:  Negative for abdominal pain, blood in stool, constipation, diarrhea, heartburn, melena, nausea and vomiting.  Genitourinary:  Negative for dysuria, frequency and urgency.  Musculoskeletal:  Positive for joint pain. Negative for back pain.  Skin: Negative.  Negative for itching and rash.  Neurological:  Negative for dizziness, tingling, focal weakness, weakness and headaches.  Endo/Heme/Allergies:  Does not bruise/bleed easily.  Psychiatric/Behavioral:  Negative for depression. The patient is not nervous/anxious and does not have insomnia.     MEDICAL HISTORY:  Past Medical History:  Diagnosis Date  . Anemia   . Arthritis    legs, knees, back, wrists  . Diabetes mellitus without complication ([place])   . Hypertension   . Nausea   . Patient is [name]   . S/P TAVR (transcatheter aortic valve replacement) 11/09/2019   s/p TAVR with a 26 mm Edwards S3U via the TF approach by Dr. Angelena Form & Cyndia Bent  . Severe aortic stenosis   . SIRS (systemic inflammatory response syndrome) ([place]) 11/2019  . Thyroid disease     SURGICAL HISTORY: Past Surgical History:  Procedure Laterality Date  . CARDIAC CATHETERIZATION  10/19/2019  . COLON SURGERY     caudarized polyps; rectal carcinoid excision ~ 2011  . COLONOSCOPY    . COLONOSCOPY WITH PROPOFOL N/A 03/13/2015   Procedure: COLONOSCOPY WITH PROPOFOL;  Surgeon: Manya Silvas, MD;  Location: [facility] ENDOSCOPY;  Service: Endoscopy;  Laterality: N/A;  . ESOPHAGOGASTRODUODENOSCOPY N/A 03/13/2015   Procedure: ESOPHAGOGASTRODUODENOSCOPY (EGD);  Surgeon: Manya Silvas, MD;  Location[facility] ENDOSCOPY;  Service: Endoscopy;  Laterality: N/A;  . INTRAOPERATIVE TRANSTHORACIC ECHOCARDIOGRAM N/A 11/09/2019   Procedure: INTRAOPERATIVE TRANSTHORACIC ECHOCARDIOGRAM;  Surgeon: Burnell Blanks, MD;  Location: [place];  Service: Open Heart Surgery;  Laterality: N/A;  . PARATHYROIDECTOMY  11/20/2004   right  superior  parathyroidecomy for primary hyperparathyroidism/parathyroid adenoma  . RIGHT/LEFT HEART CATH AND CORONARY ANGIOGRAPHY N/A 10/19/2019   Procedure: RIGHT/LEFT HEART CATH AND CORONARY ANGIOGRAPHY;  Surgeon: Nelva Bush, MD;  Location: [place] CV LAB;  Service: Cardiovascular;  Laterality: N/A;  . THYROIDECTOMY    . TRANSCATHETER AORTIC VALVE REPLACEMENT, TRANSFEMORAL N/A 11/09/2019   Procedure: TRANSCATHETER AORTIC VALVE REPLACEMENT, TRANSFEMORAL;  Surgeon: Burnell Blanks, MD;  Location: [place];  Service: Open Heart Surgery;  Laterality: N/A;    SOCIAL HISTORY: Social History   Socioeconomic History  . Marital status: Divorced    Spouse name: Not on file  . Number of children: 2  . Years of education: 84  . Highest education level: 12th grade  Occupational History  . Occupation: retired-worked with nandicapped adults  Tobacco Use  . Smoking status: Former    Years: 20.00    Types: Cigarettes    Quit date: 09/05/1979    Years since quitting: 41.3  . Smokeless tobacco: Former  Media planner  . Vaping Use: Never used  Substance and Sexual Activity  . Alcohol use: Yes    Alcohol/week: 0.0 - 1.0 standard drinks    Comment: occ. wine  . Drug use: No  . Sexual activity: Not on file  Other Topics Concern  . Not on file  Social History Narrative  . Not on file   Social Determinants of Health   Financial Resource Strain: Low Risk   . Difficulty of Paying Living Expenses: Not hard at all  Food Insecurity: No Food Insecurity  . Worried About Charity fundraiser in the Last Year: Never true  . Ran Out of Food in the Last Year: Never true  Transportation Needs: No Transportation Needs  . Lack of Transportation (Medical): No  . Lack of Transportation (Non-Medical): No  Physical Activity: Insufficiently Active  . Days of Exercise per Week: 2 days  . Minutes of Exercise per Session: 20 min  Stress: No Stress Concern Present  . Feeling of Stress : Not at all  Social  Connections: Not on file  Intimate Partner Violence: Not on file    FAMILY HISTORY: Family History  Problem Relation Age of Onset  . Parkinson's disease Mother   . Heart disease Father   . Lupus Sister   . Aneurysm Son   . Lupus Son   . Breast cancer Maternal Aunt        mat great aunt    ALLERGIES:  is allergic to ferumoxytol, iron, other, and penicillins.  MEDICATIONS:  Current Outpatient Medications  Medication Sig Dispense Refill  . ACCU-CHEK GUIDE test strip Use to check blood sugar twice daily 200 each 12  . Accu-Chek Softclix Lancets lancets Use to check blood sugar twice daily 200 each 12  . acetaminophen (TYLENOL) 500 MG tablet Take 1,000 mg by mouth every 6 (six) hours as needed for moderate pain.     . Artificial Tear Solution (GENTEAL TEARS) 0.1-0.2-0.3 % SOLN Place 1 drop into both eyes 3 (three) times daily as needed (dry/irritated eyes.).    Marland Kitchen aspirin 81 MG chewable tablet Chew 1 tablet (81 mg total) by mouth daily.    . B COMPLEX-C PO Take 10 mLs by mouth daily.    . baclofen (LIORESAL) 10 MG tablet TAKE 1 TO 2 TABLETS BY MOUTH TWICE DAILY AS NEEDED FOR MUSCLE SPASM 60 tablet 2  . Blood Glucose Monitoring Suppl (ACCU-CHEK GUIDE) w/Device KIT Use to check blood sugar twice daily 1  kit 0  . Cholecalciferol (VITAMIN D3) 125 MCG (5000 UT) TABS Take 5,000 Units by mouth daily.    . Ferrous Sulfate (IRON) 325 (65 Fe) MG TABS TAKE 1 TABLET BY MOUTH TWICE DAILY WITH A MEAL 180 each 1  . furosemide (LASIX) 20 MG tablet Take 1 tablet (20 mg total) by mouth daily as needed for edema. 45 tablet 6  . hydrochlorothiazide (HYDRODIURIL) 25 MG tablet Take 1 tablet (25 mg total) by mouth daily. 90 tablet 3  . lactobacillus acidophilus (BACID) TABS tablet Take 2 tablets by mouth 2 (two) times daily as needed (digestive health.).    Marland Kitchen levocetirizine (XYZAL) 5 MG tablet TAKE 1 TABLET BY MOUTH ONCE DAILY IN THE EVENING (Patient taking differently: Take 1/2 tablet (2.5 mg) daily in the  evening as needed for allergies) 90 tablet 3  . losartan (COZAAR) 50 MG tablet Take 1 tablet (50 mg total) by mouth daily. 90 tablet 3  . magnesium oxide (MAG-OX) 400 MG tablet Take 400 mg by mouth daily.    Marland Kitchen SHINGRIX injection Inject 0.5 mL into muscle for shingles vaccine. Repeat dose in 2-6 months. 0.5 mL 1  . simvastatin (ZOCOR) 20 MG tablet TAKE 1 TABLET BY MOUTH ONCE DAILY AT  6  PM 90 tablet 3  . TRULICITY 9.52 WU/1.3KG SOPN Inject 0.75 mg into the skin once a week. 12 mL 1   No current facility-administered medications for this visit.      Marland Kitchen  PHYSICAL EXAMINATION: ECOG PERFORMANCE STATUS: 1 - Symptomatic but completely ambulatory  Vitals:   01/11/21 1049  BP: 136/69  Pulse: 80  Resp: 16  Temp: (!) 97 F (36.1 C)  SpO2: 100%   Filed Weights   01/11/21 1049  Weight: 232 lb 9.6 oz (105.5 kg)    Physical Exam Constitutional:      Comments: Patient is obese.  In a wheelchair.  Accompanied by daughter.  HENT:     Head: Normocephalic and atraumatic.     Mouth/Throat:     Pharynx: No oropharyngeal exudate.  Eyes:     Pupils: Pupils are equal, round, and reactive to light.  Cardiovascular:     Rate and Rhythm: Normal rate and regular rhythm.  Pulmonary:     Effort: Pulmonary effort is normal. No respiratory distress.     Breath sounds: Normal breath sounds. No wheezing.  Abdominal:     General: Bowel sounds are normal. There is no distension.     Palpations: Abdomen is soft. There is no mass.     Tenderness: There is no abdominal tenderness. There is no guarding or rebound.  Musculoskeletal:        General: No tenderness. Normal range of motion.     Cervical back: Normal range of motion and neck supple.  Skin:    General: Skin is warm.  Neurological:     Mental Status: She is alert and oriented to person, place, and time.  Psychiatric:        Mood and Affect: Affect normal.    LABORATORY DATA:  I have reviewed the data as listed Lab Results  Component  Value Date   WBC 3.5 (L) 01/11/2021   HGB 9.9 (L) 01/11/2021   HCT 31.2 (L) 01/11/2021   MCV 95.4 01/11/2021   PLT 191 01/11/2021   Recent Labs    01/14/20 0948 05/03/20 0935 07/13/20 1018 01/11/21 1008  NA 138 140 141 137  K 4.3 4.2 4.3 3.9  CL 104 102 108  103  CO2 '22 23 24 27  ' GLUCOSE 214* 212* 225* 150*  BUN 36* 39* 42* 29*  CREATININE 1.19* 1.36* 1.28* 1.04*  CALCIUM 9.2 9.7 9.2 9.0  GFRNONAA 45* 39* 44* 56*  GFRAA 52* 45*  --   --   PROT  --  7.3  --   --   ALBUMIN  --  3.9  --   --   AST  --  41*  --   --   ALT  --  26  --   --   ALKPHOS  --  72  --   --   BILITOT  --  0.2  --   --     ASSESSMENT & PLAN:  Normocytic anemia #Normocytic anemia unclear etiology; previous history of AVM iron deficiency; September 2020-iron studies negative for deficiency.  Bone marrow biopsy negative for any significant dyspoiesis.  Cytogenetics normal.  #Today hemoglobin is 9.9 STABLE- stable.   Slightly low; otherwise fairly asymptomatic.Patient is on p.o. iron.                            #White count 3.5 chronic benign ethnic neutropenia-neutrophil count 1.6- STABLE.   # Relative B12 deficiency-200; March 2021 -normal continue sublingual B12 tablets.   #Chronic kidney disease-stage III mild [[facility]]; reviewed the note.  # DISPOSITION: # in 6 months-MD; labs- cbc/bmp/ iron studies/ferritin/ b12---Dr.B      Cammie Sickle, MD 01/11/2021 5:16 PM

## 2021-01-11 NOTE — Progress Notes (Signed)
Pt has no new concerns or complaints at this time.

## 2021-01-16 DIAGNOSIS — E785 Hyperlipidemia, unspecified: Secondary | ICD-10-CM | POA: Diagnosis not present

## 2021-01-16 DIAGNOSIS — D709 Neutropenia, unspecified: Secondary | ICD-10-CM | POA: Diagnosis not present

## 2021-01-16 DIAGNOSIS — M5124 Other intervertebral disc displacement, thoracic region: Secondary | ICD-10-CM | POA: Diagnosis not present

## 2021-01-16 DIAGNOSIS — H8102 Meniere's disease, left ear: Secondary | ICD-10-CM | POA: Diagnosis not present

## 2021-01-16 DIAGNOSIS — D509 Iron deficiency anemia, unspecified: Secondary | ICD-10-CM | POA: Diagnosis not present

## 2021-01-16 DIAGNOSIS — M542 Cervicalgia: Secondary | ICD-10-CM | POA: Diagnosis not present

## 2021-01-16 DIAGNOSIS — Z952 Presence of prosthetic heart valve: Secondary | ICD-10-CM | POA: Diagnosis not present

## 2021-01-16 DIAGNOSIS — Z7982 Long term (current) use of aspirin: Secondary | ICD-10-CM | POA: Diagnosis not present

## 2021-01-16 DIAGNOSIS — G8929 Other chronic pain: Secondary | ICD-10-CM | POA: Diagnosis not present

## 2021-01-16 DIAGNOSIS — N1832 Chronic kidney disease, stage 3b: Secondary | ICD-10-CM | POA: Diagnosis not present

## 2021-01-16 DIAGNOSIS — Z9089 Acquired absence of other organs: Secondary | ICD-10-CM | POA: Diagnosis not present

## 2021-01-16 DIAGNOSIS — D631 Anemia in chronic kidney disease: Secondary | ICD-10-CM | POA: Diagnosis not present

## 2021-01-16 DIAGNOSIS — Z9181 History of falling: Secondary | ICD-10-CM | POA: Diagnosis not present

## 2021-01-16 DIAGNOSIS — E1122 Type 2 diabetes mellitus with diabetic chronic kidney disease: Secondary | ICD-10-CM | POA: Diagnosis not present

## 2021-01-16 DIAGNOSIS — M19032 Primary osteoarthritis, left wrist: Secondary | ICD-10-CM | POA: Diagnosis not present

## 2021-01-16 DIAGNOSIS — M19031 Primary osteoarthritis, right wrist: Secondary | ICD-10-CM | POA: Diagnosis not present

## 2021-01-16 DIAGNOSIS — M170 Bilateral primary osteoarthritis of knee: Secondary | ICD-10-CM | POA: Diagnosis not present

## 2021-01-16 DIAGNOSIS — Z87891 Personal history of nicotine dependence: Secondary | ICD-10-CM | POA: Diagnosis not present

## 2021-01-16 DIAGNOSIS — Z79899 Other long term (current) drug therapy: Secondary | ICD-10-CM | POA: Diagnosis not present

## 2021-01-16 DIAGNOSIS — I129 Hypertensive chronic kidney disease with stage 1 through stage 4 chronic kidney disease, or unspecified chronic kidney disease: Secondary | ICD-10-CM | POA: Diagnosis not present

## 2021-01-16 DIAGNOSIS — E538 Deficiency of other specified B group vitamins: Secondary | ICD-10-CM | POA: Diagnosis not present

## 2021-01-16 DIAGNOSIS — M479 Spondylosis, unspecified: Secondary | ICD-10-CM | POA: Diagnosis not present

## 2021-01-16 DIAGNOSIS — Z85040 Personal history of malignant carcinoid tumor of rectum: Secondary | ICD-10-CM | POA: Diagnosis not present

## 2021-01-19 ENCOUNTER — Telehealth: Payer: Self-pay | Admitting: Family Medicine

## 2021-01-19 DIAGNOSIS — M5124 Other intervertebral disc displacement, thoracic region: Secondary | ICD-10-CM | POA: Diagnosis not present

## 2021-01-19 DIAGNOSIS — E785 Hyperlipidemia, unspecified: Secondary | ICD-10-CM | POA: Diagnosis not present

## 2021-01-19 DIAGNOSIS — M479 Spondylosis, unspecified: Secondary | ICD-10-CM | POA: Diagnosis not present

## 2021-01-19 DIAGNOSIS — Z952 Presence of prosthetic heart valve: Secondary | ICD-10-CM | POA: Diagnosis not present

## 2021-01-19 DIAGNOSIS — E538 Deficiency of other specified B group vitamins: Secondary | ICD-10-CM | POA: Diagnosis not present

## 2021-01-19 DIAGNOSIS — D509 Iron deficiency anemia, unspecified: Secondary | ICD-10-CM | POA: Diagnosis not present

## 2021-01-19 DIAGNOSIS — E1122 Type 2 diabetes mellitus with diabetic chronic kidney disease: Secondary | ICD-10-CM | POA: Diagnosis not present

## 2021-01-19 DIAGNOSIS — M19031 Primary osteoarthritis, right wrist: Secondary | ICD-10-CM | POA: Diagnosis not present

## 2021-01-19 DIAGNOSIS — G8929 Other chronic pain: Secondary | ICD-10-CM | POA: Diagnosis not present

## 2021-01-19 DIAGNOSIS — M542 Cervicalgia: Secondary | ICD-10-CM | POA: Diagnosis not present

## 2021-01-19 DIAGNOSIS — M19032 Primary osteoarthritis, left wrist: Secondary | ICD-10-CM | POA: Diagnosis not present

## 2021-01-19 DIAGNOSIS — I129 Hypertensive chronic kidney disease with stage 1 through stage 4 chronic kidney disease, or unspecified chronic kidney disease: Secondary | ICD-10-CM | POA: Diagnosis not present

## 2021-01-19 DIAGNOSIS — Z87891 Personal history of nicotine dependence: Secondary | ICD-10-CM | POA: Diagnosis not present

## 2021-01-19 DIAGNOSIS — D709 Neutropenia, unspecified: Secondary | ICD-10-CM | POA: Diagnosis not present

## 2021-01-19 DIAGNOSIS — M170 Bilateral primary osteoarthritis of knee: Secondary | ICD-10-CM | POA: Diagnosis not present

## 2021-01-19 DIAGNOSIS — H8102 Meniere's disease, left ear: Secondary | ICD-10-CM | POA: Diagnosis not present

## 2021-01-19 DIAGNOSIS — Z7982 Long term (current) use of aspirin: Secondary | ICD-10-CM | POA: Diagnosis not present

## 2021-01-19 DIAGNOSIS — D631 Anemia in chronic kidney disease: Secondary | ICD-10-CM | POA: Diagnosis not present

## 2021-01-19 DIAGNOSIS — N1832 Chronic kidney disease, stage 3b: Secondary | ICD-10-CM | POA: Diagnosis not present

## 2021-01-19 DIAGNOSIS — Z9181 History of falling: Secondary | ICD-10-CM | POA: Diagnosis not present

## 2021-01-19 DIAGNOSIS — Z85040 Personal history of malignant carcinoid tumor of rectum: Secondary | ICD-10-CM | POA: Diagnosis not present

## 2021-01-19 DIAGNOSIS — Z79899 Other long term (current) drug therapy: Secondary | ICD-10-CM | POA: Diagnosis not present

## 2021-01-19 DIAGNOSIS — Z9089 Acquired absence of other organs: Secondary | ICD-10-CM | POA: Diagnosis not present

## 2021-01-19 NOTE — Telephone Encounter (Signed)
Home Health Verbal Orders - Caller/Agency: Glorious Peach with [facility] Number: [phone]  Requesting OT/PT/Skilled Nursing/Social Work/Speech Therapy:   Frequency: Ot therapy for 1week 1, 2 weeks 3

## 2021-01-22 DIAGNOSIS — N189 Chronic kidney disease, unspecified: Secondary | ICD-10-CM | POA: Diagnosis not present

## 2021-01-22 NOTE — Telephone Encounter (Signed)
Verbal orders have been confirmed with [place].

## 2021-01-22 NOTE — Telephone Encounter (Signed)
Okay to give verbal for orders  [name], [facility] 01/22/2021, 10:20 AM

## 2021-01-23 DIAGNOSIS — I129 Hypertensive chronic kidney disease with stage 1 through stage 4 chronic kidney disease, or unspecified chronic kidney disease: Secondary | ICD-10-CM | POA: Diagnosis not present

## 2021-01-23 DIAGNOSIS — Z7982 Long term (current) use of aspirin: Secondary | ICD-10-CM | POA: Diagnosis not present

## 2021-01-23 DIAGNOSIS — Z952 Presence of prosthetic heart valve: Secondary | ICD-10-CM | POA: Diagnosis not present

## 2021-01-23 DIAGNOSIS — D631 Anemia in chronic kidney disease: Secondary | ICD-10-CM | POA: Diagnosis not present

## 2021-01-23 DIAGNOSIS — M479 Spondylosis, unspecified: Secondary | ICD-10-CM | POA: Diagnosis not present

## 2021-01-23 DIAGNOSIS — M542 Cervicalgia: Secondary | ICD-10-CM | POA: Diagnosis not present

## 2021-01-23 DIAGNOSIS — D709 Neutropenia, unspecified: Secondary | ICD-10-CM | POA: Diagnosis not present

## 2021-01-23 DIAGNOSIS — Z87891 Personal history of nicotine dependence: Secondary | ICD-10-CM | POA: Diagnosis not present

## 2021-01-23 DIAGNOSIS — D509 Iron deficiency anemia, unspecified: Secondary | ICD-10-CM | POA: Diagnosis not present

## 2021-01-23 DIAGNOSIS — E1122 Type 2 diabetes mellitus with diabetic chronic kidney disease: Secondary | ICD-10-CM | POA: Diagnosis not present

## 2021-01-23 DIAGNOSIS — Z9181 History of falling: Secondary | ICD-10-CM | POA: Diagnosis not present

## 2021-01-23 DIAGNOSIS — Z9089 Acquired absence of other organs: Secondary | ICD-10-CM | POA: Diagnosis not present

## 2021-01-23 DIAGNOSIS — M170 Bilateral primary osteoarthritis of knee: Secondary | ICD-10-CM | POA: Diagnosis not present

## 2021-01-23 DIAGNOSIS — E538 Deficiency of other specified B group vitamins: Secondary | ICD-10-CM | POA: Diagnosis not present

## 2021-01-23 DIAGNOSIS — Z79899 Other long term (current) drug therapy: Secondary | ICD-10-CM | POA: Diagnosis not present

## 2021-01-23 DIAGNOSIS — M19032 Primary osteoarthritis, left wrist: Secondary | ICD-10-CM | POA: Diagnosis not present

## 2021-01-23 DIAGNOSIS — M5124 Other intervertebral disc displacement, thoracic region: Secondary | ICD-10-CM | POA: Diagnosis not present

## 2021-01-23 DIAGNOSIS — E785 Hyperlipidemia, unspecified: Secondary | ICD-10-CM | POA: Diagnosis not present

## 2021-01-23 DIAGNOSIS — M19031 Primary osteoarthritis, right wrist: Secondary | ICD-10-CM | POA: Diagnosis not present

## 2021-01-23 DIAGNOSIS — Z85040 Personal history of malignant carcinoid tumor of rectum: Secondary | ICD-10-CM | POA: Diagnosis not present

## 2021-01-23 DIAGNOSIS — N1832 Chronic kidney disease, stage 3b: Secondary | ICD-10-CM | POA: Diagnosis not present

## 2021-01-23 DIAGNOSIS — G8929 Other chronic pain: Secondary | ICD-10-CM | POA: Diagnosis not present

## 2021-01-23 DIAGNOSIS — H8102 Meniere's disease, left ear: Secondary | ICD-10-CM | POA: Diagnosis not present

## 2021-01-24 DIAGNOSIS — D631 Anemia in chronic kidney disease: Secondary | ICD-10-CM | POA: Diagnosis not present

## 2021-01-24 DIAGNOSIS — E1122 Type 2 diabetes mellitus with diabetic chronic kidney disease: Secondary | ICD-10-CM | POA: Diagnosis not present

## 2021-01-24 DIAGNOSIS — D509 Iron deficiency anemia, unspecified: Secondary | ICD-10-CM | POA: Diagnosis not present

## 2021-01-24 DIAGNOSIS — I129 Hypertensive chronic kidney disease with stage 1 through stage 4 chronic kidney disease, or unspecified chronic kidney disease: Secondary | ICD-10-CM | POA: Diagnosis not present

## 2021-01-24 DIAGNOSIS — M5124 Other intervertebral disc displacement, thoracic region: Secondary | ICD-10-CM | POA: Diagnosis not present

## 2021-01-24 DIAGNOSIS — Z9089 Acquired absence of other organs: Secondary | ICD-10-CM | POA: Diagnosis not present

## 2021-01-24 DIAGNOSIS — M19031 Primary osteoarthritis, right wrist: Secondary | ICD-10-CM | POA: Diagnosis not present

## 2021-01-24 DIAGNOSIS — D709 Neutropenia, unspecified: Secondary | ICD-10-CM | POA: Diagnosis not present

## 2021-01-24 DIAGNOSIS — Z85040 Personal history of malignant carcinoid tumor of rectum: Secondary | ICD-10-CM | POA: Diagnosis not present

## 2021-01-24 DIAGNOSIS — Z9181 History of falling: Secondary | ICD-10-CM | POA: Diagnosis not present

## 2021-01-24 DIAGNOSIS — Z87891 Personal history of nicotine dependence: Secondary | ICD-10-CM | POA: Diagnosis not present

## 2021-01-24 DIAGNOSIS — E785 Hyperlipidemia, unspecified: Secondary | ICD-10-CM | POA: Diagnosis not present

## 2021-01-24 DIAGNOSIS — E538 Deficiency of other specified B group vitamins: Secondary | ICD-10-CM | POA: Diagnosis not present

## 2021-01-24 DIAGNOSIS — Z952 Presence of prosthetic heart valve: Secondary | ICD-10-CM | POA: Diagnosis not present

## 2021-01-24 DIAGNOSIS — H8102 Meniere's disease, left ear: Secondary | ICD-10-CM | POA: Diagnosis not present

## 2021-01-24 DIAGNOSIS — Z79899 Other long term (current) drug therapy: Secondary | ICD-10-CM | POA: Diagnosis not present

## 2021-01-24 DIAGNOSIS — Z7982 Long term (current) use of aspirin: Secondary | ICD-10-CM | POA: Diagnosis not present

## 2021-01-24 DIAGNOSIS — M19032 Primary osteoarthritis, left wrist: Secondary | ICD-10-CM | POA: Diagnosis not present

## 2021-01-24 DIAGNOSIS — M479 Spondylosis, unspecified: Secondary | ICD-10-CM | POA: Diagnosis not present

## 2021-01-24 DIAGNOSIS — M542 Cervicalgia: Secondary | ICD-10-CM | POA: Diagnosis not present

## 2021-01-24 DIAGNOSIS — M170 Bilateral primary osteoarthritis of knee: Secondary | ICD-10-CM | POA: Diagnosis not present

## 2021-01-24 DIAGNOSIS — G8929 Other chronic pain: Secondary | ICD-10-CM | POA: Diagnosis not present

## 2021-01-24 DIAGNOSIS — N1832 Chronic kidney disease, stage 3b: Secondary | ICD-10-CM | POA: Diagnosis not present

## 2021-01-25 DIAGNOSIS — Z7982 Long term (current) use of aspirin: Secondary | ICD-10-CM | POA: Diagnosis not present

## 2021-01-25 DIAGNOSIS — M542 Cervicalgia: Secondary | ICD-10-CM | POA: Diagnosis not present

## 2021-01-25 DIAGNOSIS — G8929 Other chronic pain: Secondary | ICD-10-CM | POA: Diagnosis not present

## 2021-01-25 DIAGNOSIS — D631 Anemia in chronic kidney disease: Secondary | ICD-10-CM | POA: Diagnosis not present

## 2021-01-25 DIAGNOSIS — M5124 Other intervertebral disc displacement, thoracic region: Secondary | ICD-10-CM | POA: Diagnosis not present

## 2021-01-25 DIAGNOSIS — I129 Hypertensive chronic kidney disease with stage 1 through stage 4 chronic kidney disease, or unspecified chronic kidney disease: Secondary | ICD-10-CM | POA: Diagnosis not present

## 2021-01-25 DIAGNOSIS — D709 Neutropenia, unspecified: Secondary | ICD-10-CM | POA: Diagnosis not present

## 2021-01-25 DIAGNOSIS — Z87891 Personal history of nicotine dependence: Secondary | ICD-10-CM | POA: Diagnosis not present

## 2021-01-25 DIAGNOSIS — Z9181 History of falling: Secondary | ICD-10-CM | POA: Diagnosis not present

## 2021-01-25 DIAGNOSIS — M170 Bilateral primary osteoarthritis of knee: Secondary | ICD-10-CM | POA: Diagnosis not present

## 2021-01-25 DIAGNOSIS — M479 Spondylosis, unspecified: Secondary | ICD-10-CM | POA: Diagnosis not present

## 2021-01-25 DIAGNOSIS — E538 Deficiency of other specified B group vitamins: Secondary | ICD-10-CM | POA: Diagnosis not present

## 2021-01-25 DIAGNOSIS — D509 Iron deficiency anemia, unspecified: Secondary | ICD-10-CM | POA: Diagnosis not present

## 2021-01-25 DIAGNOSIS — Z85040 Personal history of malignant carcinoid tumor of rectum: Secondary | ICD-10-CM | POA: Diagnosis not present

## 2021-01-25 DIAGNOSIS — Z952 Presence of prosthetic heart valve: Secondary | ICD-10-CM | POA: Diagnosis not present

## 2021-01-25 DIAGNOSIS — M19031 Primary osteoarthritis, right wrist: Secondary | ICD-10-CM | POA: Diagnosis not present

## 2021-01-25 DIAGNOSIS — Z9089 Acquired absence of other organs: Secondary | ICD-10-CM | POA: Diagnosis not present

## 2021-01-25 DIAGNOSIS — E785 Hyperlipidemia, unspecified: Secondary | ICD-10-CM | POA: Diagnosis not present

## 2021-01-25 DIAGNOSIS — Z79899 Other long term (current) drug therapy: Secondary | ICD-10-CM | POA: Diagnosis not present

## 2021-01-25 DIAGNOSIS — M19032 Primary osteoarthritis, left wrist: Secondary | ICD-10-CM | POA: Diagnosis not present

## 2021-01-25 DIAGNOSIS — N1832 Chronic kidney disease, stage 3b: Secondary | ICD-10-CM | POA: Diagnosis not present

## 2021-01-25 DIAGNOSIS — E1122 Type 2 diabetes mellitus with diabetic chronic kidney disease: Secondary | ICD-10-CM | POA: Diagnosis not present

## 2021-01-25 DIAGNOSIS — H8102 Meniere's disease, left ear: Secondary | ICD-10-CM | POA: Diagnosis not present

## 2021-01-27 IMAGING — CR DG CHEST 2V
2 series · 2 of 2 positions shown · non-contrast
Comparison: 03/25/2019

CLINICAL DATA: Preop TAVR

EXAM:
CHEST - 2 VIEW

[w chest pa]
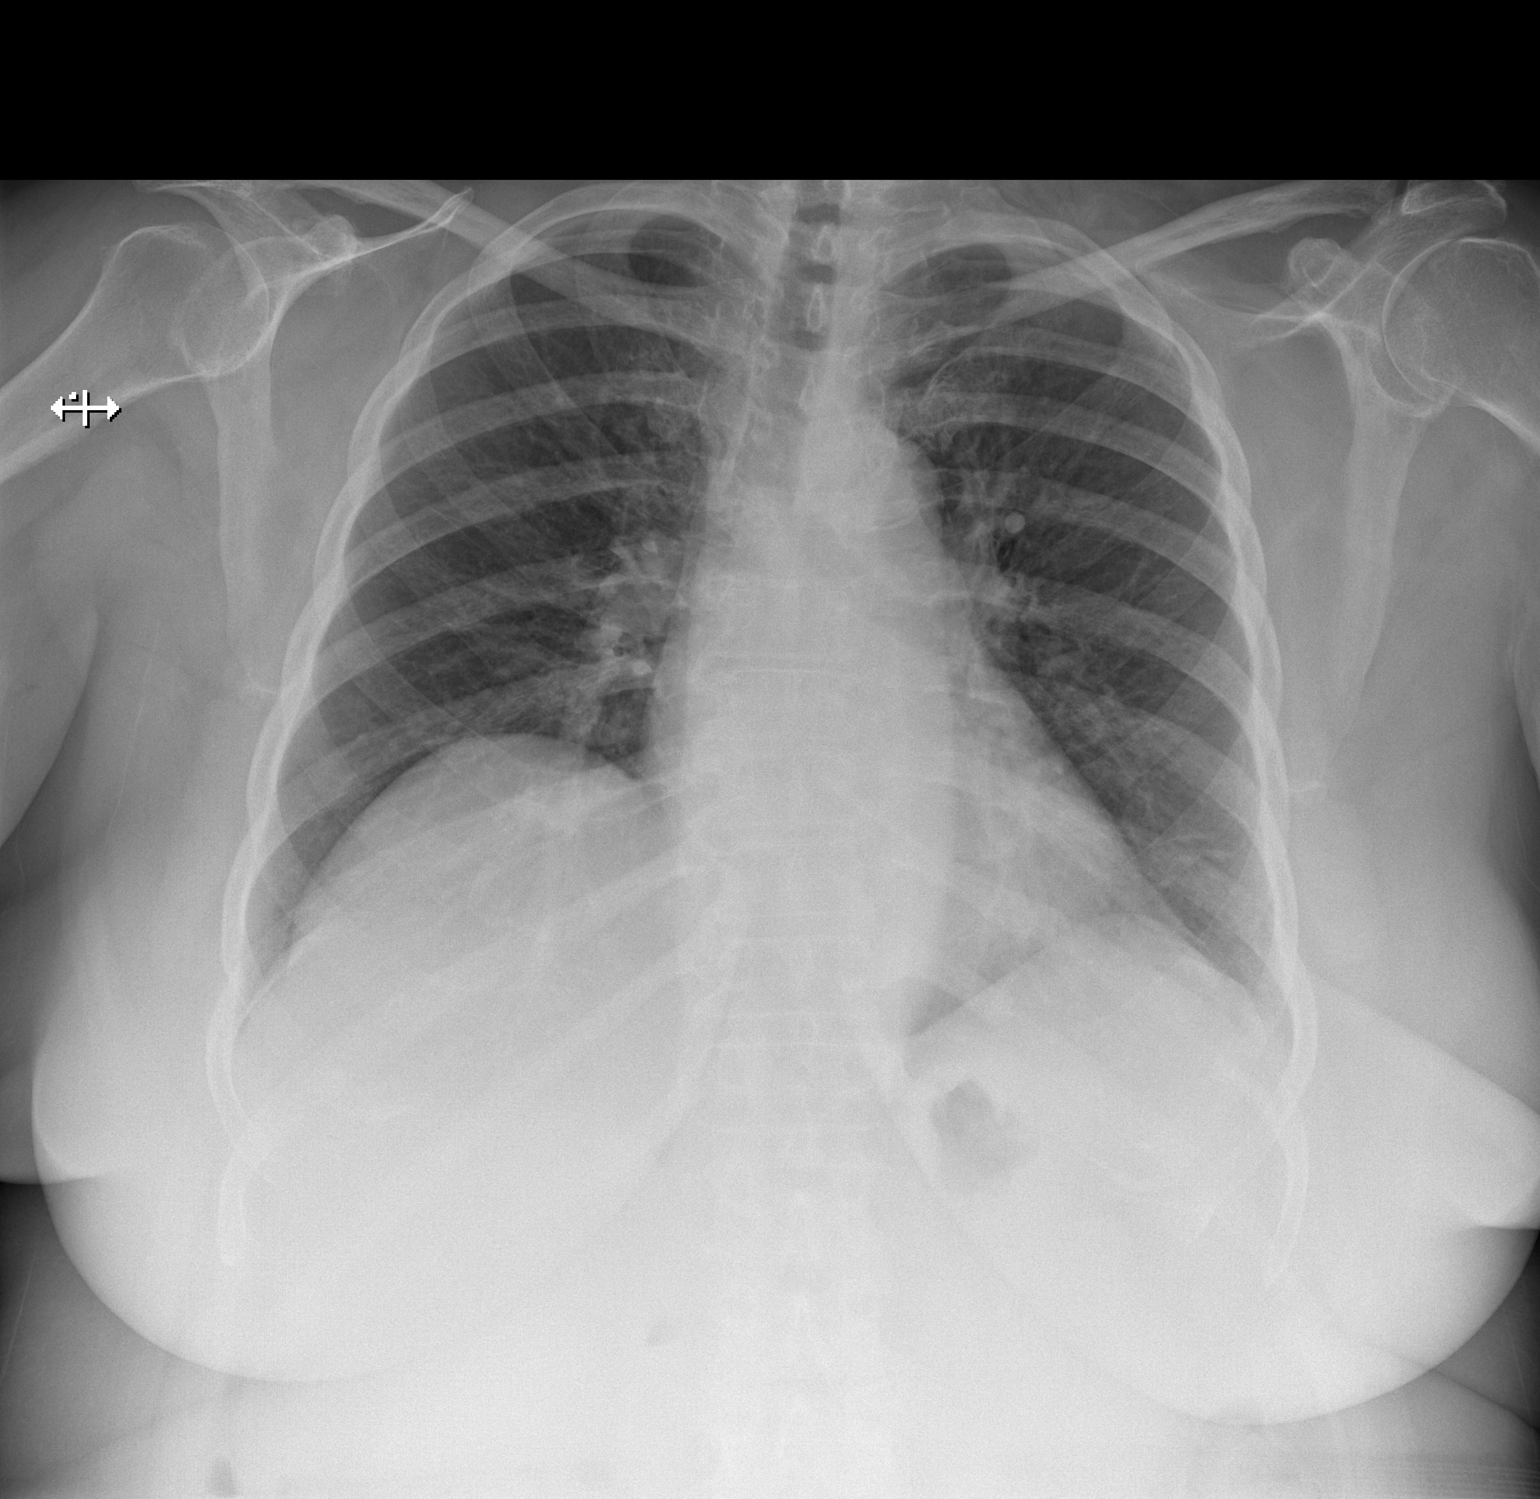

[w chest lat]
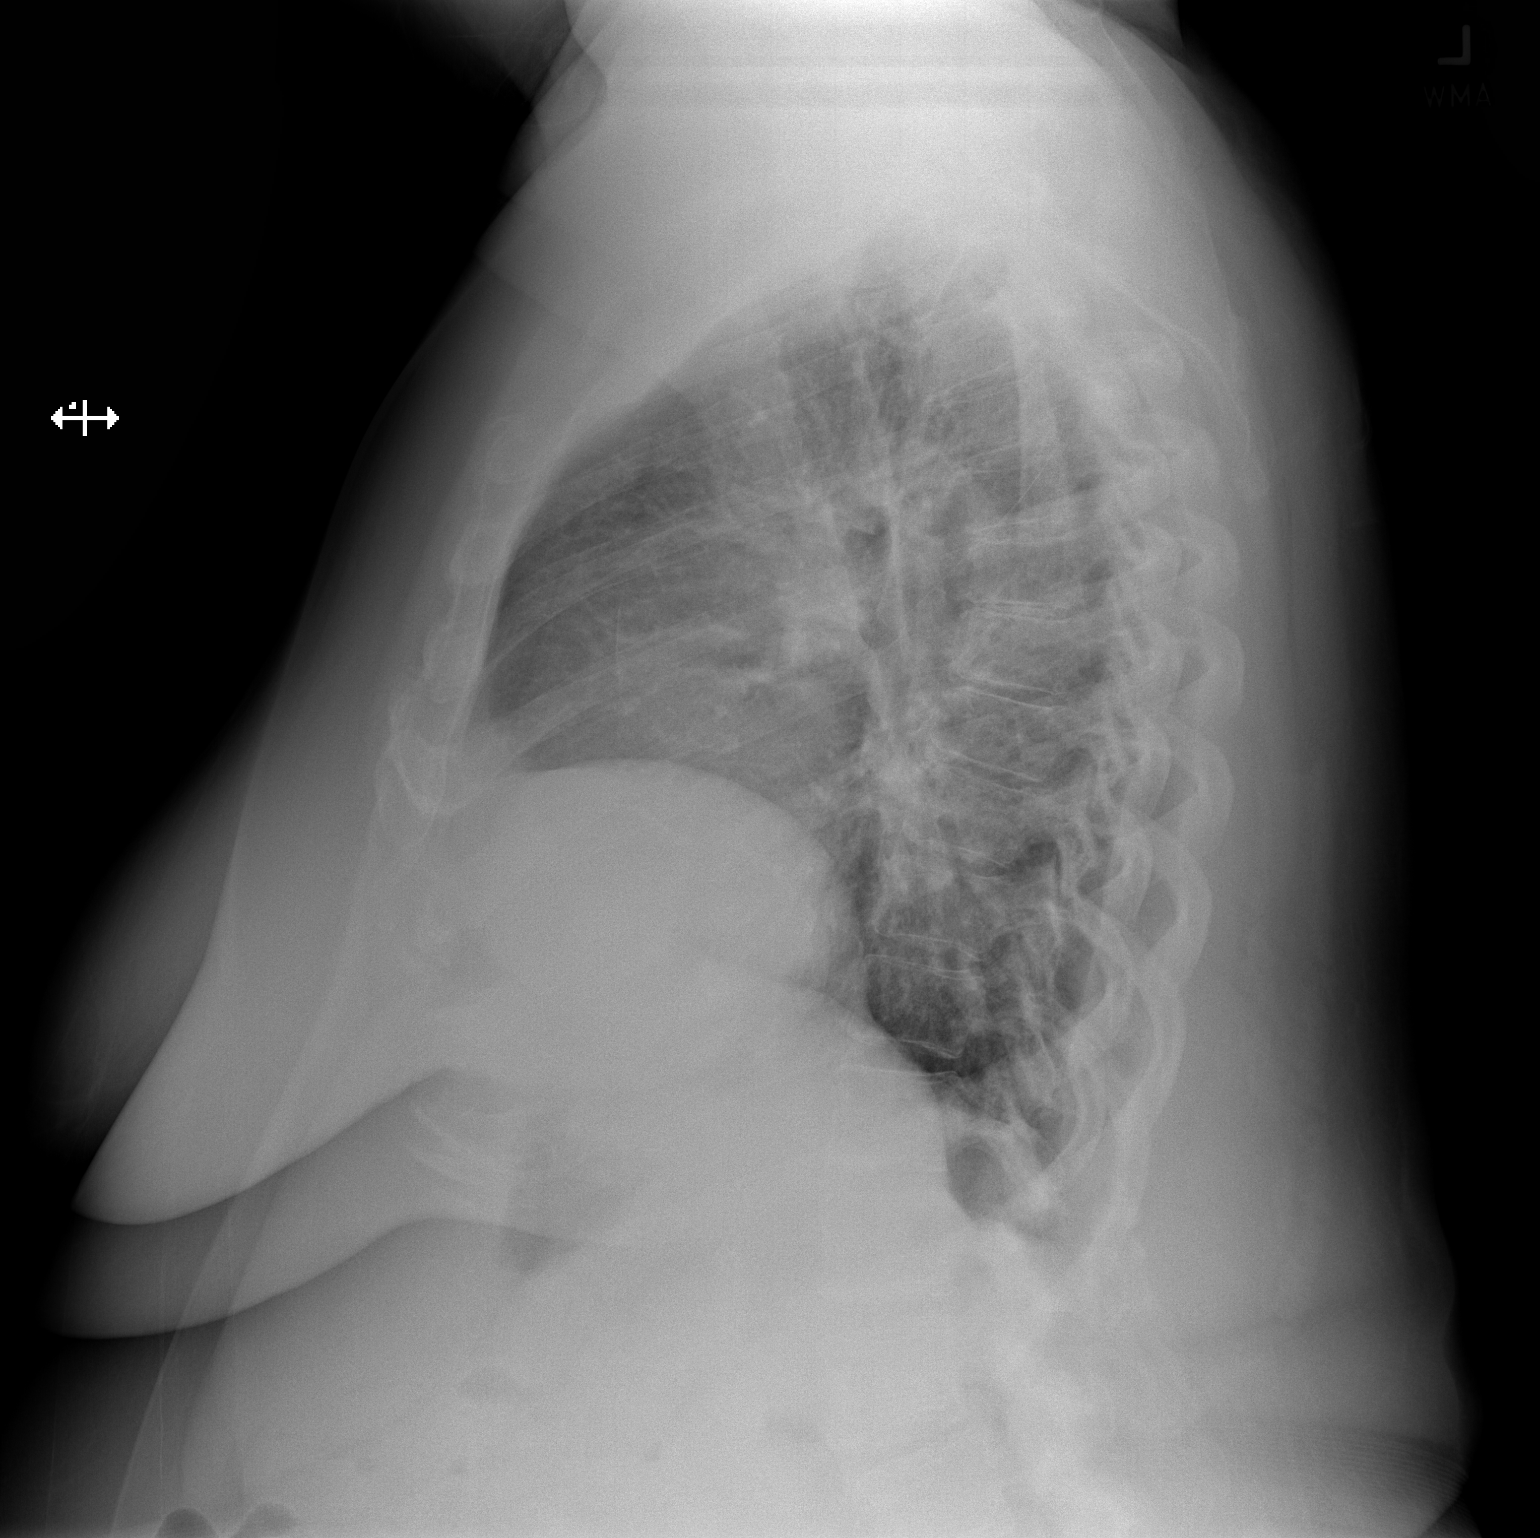

[2 of 2 positions shown; findings below may reference images not displayed]

FINDINGS: The heart size and mediastinal contours are within normal limits.
Aortic atherosclerosis. Both lungs are clear. The visualized
skeletal structures are unremarkable.
IMPRESSION: No active cardiopulmonary disease.

## 2021-01-29 DIAGNOSIS — I129 Hypertensive chronic kidney disease with stage 1 through stage 4 chronic kidney disease, or unspecified chronic kidney disease: Secondary | ICD-10-CM | POA: Diagnosis not present

## 2021-01-29 DIAGNOSIS — E785 Hyperlipidemia, unspecified: Secondary | ICD-10-CM | POA: Diagnosis not present

## 2021-01-29 DIAGNOSIS — N1832 Chronic kidney disease, stage 3b: Secondary | ICD-10-CM | POA: Diagnosis not present

## 2021-01-29 DIAGNOSIS — G8929 Other chronic pain: Secondary | ICD-10-CM | POA: Diagnosis not present

## 2021-01-29 DIAGNOSIS — Z952 Presence of prosthetic heart valve: Secondary | ICD-10-CM | POA: Diagnosis not present

## 2021-01-29 DIAGNOSIS — M19032 Primary osteoarthritis, left wrist: Secondary | ICD-10-CM | POA: Diagnosis not present

## 2021-01-29 DIAGNOSIS — E538 Deficiency of other specified B group vitamins: Secondary | ICD-10-CM | POA: Diagnosis not present

## 2021-01-29 DIAGNOSIS — Z79899 Other long term (current) drug therapy: Secondary | ICD-10-CM | POA: Diagnosis not present

## 2021-01-29 DIAGNOSIS — M5124 Other intervertebral disc displacement, thoracic region: Secondary | ICD-10-CM | POA: Diagnosis not present

## 2021-01-29 DIAGNOSIS — D631 Anemia in chronic kidney disease: Secondary | ICD-10-CM | POA: Diagnosis not present

## 2021-01-29 DIAGNOSIS — Z87891 Personal history of nicotine dependence: Secondary | ICD-10-CM | POA: Diagnosis not present

## 2021-01-29 DIAGNOSIS — E1122 Type 2 diabetes mellitus with diabetic chronic kidney disease: Secondary | ICD-10-CM | POA: Diagnosis not present

## 2021-01-29 DIAGNOSIS — D509 Iron deficiency anemia, unspecified: Secondary | ICD-10-CM | POA: Diagnosis not present

## 2021-01-29 DIAGNOSIS — Z9089 Acquired absence of other organs: Secondary | ICD-10-CM | POA: Diagnosis not present

## 2021-01-29 DIAGNOSIS — M479 Spondylosis, unspecified: Secondary | ICD-10-CM | POA: Diagnosis not present

## 2021-01-29 DIAGNOSIS — H8102 Meniere's disease, left ear: Secondary | ICD-10-CM | POA: Diagnosis not present

## 2021-01-29 DIAGNOSIS — M19031 Primary osteoarthritis, right wrist: Secondary | ICD-10-CM | POA: Diagnosis not present

## 2021-01-29 DIAGNOSIS — Z9181 History of falling: Secondary | ICD-10-CM | POA: Diagnosis not present

## 2021-01-29 DIAGNOSIS — M542 Cervicalgia: Secondary | ICD-10-CM | POA: Diagnosis not present

## 2021-01-29 DIAGNOSIS — M170 Bilateral primary osteoarthritis of knee: Secondary | ICD-10-CM | POA: Diagnosis not present

## 2021-01-29 DIAGNOSIS — D709 Neutropenia, unspecified: Secondary | ICD-10-CM | POA: Diagnosis not present

## 2021-01-29 DIAGNOSIS — Z85040 Personal history of malignant carcinoid tumor of rectum: Secondary | ICD-10-CM | POA: Diagnosis not present

## 2021-01-29 DIAGNOSIS — Z7982 Long term (current) use of aspirin: Secondary | ICD-10-CM | POA: Diagnosis not present

## 2021-01-31 DIAGNOSIS — M19032 Primary osteoarthritis, left wrist: Secondary | ICD-10-CM | POA: Diagnosis not present

## 2021-01-31 DIAGNOSIS — D509 Iron deficiency anemia, unspecified: Secondary | ICD-10-CM | POA: Diagnosis not present

## 2021-01-31 DIAGNOSIS — N1832 Chronic kidney disease, stage 3b: Secondary | ICD-10-CM | POA: Diagnosis not present

## 2021-01-31 DIAGNOSIS — Z9089 Acquired absence of other organs: Secondary | ICD-10-CM | POA: Diagnosis not present

## 2021-01-31 DIAGNOSIS — E785 Hyperlipidemia, unspecified: Secondary | ICD-10-CM | POA: Diagnosis not present

## 2021-01-31 DIAGNOSIS — M542 Cervicalgia: Secondary | ICD-10-CM | POA: Diagnosis not present

## 2021-01-31 DIAGNOSIS — H8102 Meniere's disease, left ear: Secondary | ICD-10-CM | POA: Diagnosis not present

## 2021-01-31 DIAGNOSIS — Z7982 Long term (current) use of aspirin: Secondary | ICD-10-CM | POA: Diagnosis not present

## 2021-01-31 DIAGNOSIS — M479 Spondylosis, unspecified: Secondary | ICD-10-CM | POA: Diagnosis not present

## 2021-01-31 DIAGNOSIS — Z79899 Other long term (current) drug therapy: Secondary | ICD-10-CM | POA: Diagnosis not present

## 2021-01-31 DIAGNOSIS — M170 Bilateral primary osteoarthritis of knee: Secondary | ICD-10-CM | POA: Diagnosis not present

## 2021-01-31 DIAGNOSIS — E538 Deficiency of other specified B group vitamins: Secondary | ICD-10-CM | POA: Diagnosis not present

## 2021-01-31 DIAGNOSIS — G8929 Other chronic pain: Secondary | ICD-10-CM | POA: Diagnosis not present

## 2021-01-31 DIAGNOSIS — D631 Anemia in chronic kidney disease: Secondary | ICD-10-CM | POA: Diagnosis not present

## 2021-01-31 DIAGNOSIS — D709 Neutropenia, unspecified: Secondary | ICD-10-CM | POA: Diagnosis not present

## 2021-01-31 DIAGNOSIS — M5124 Other intervertebral disc displacement, thoracic region: Secondary | ICD-10-CM | POA: Diagnosis not present

## 2021-01-31 DIAGNOSIS — E1122 Type 2 diabetes mellitus with diabetic chronic kidney disease: Secondary | ICD-10-CM | POA: Diagnosis not present

## 2021-01-31 DIAGNOSIS — Z85040 Personal history of malignant carcinoid tumor of rectum: Secondary | ICD-10-CM | POA: Diagnosis not present

## 2021-01-31 DIAGNOSIS — Z87891 Personal history of nicotine dependence: Secondary | ICD-10-CM | POA: Diagnosis not present

## 2021-01-31 DIAGNOSIS — Z9181 History of falling: Secondary | ICD-10-CM | POA: Diagnosis not present

## 2021-01-31 DIAGNOSIS — M19031 Primary osteoarthritis, right wrist: Secondary | ICD-10-CM | POA: Diagnosis not present

## 2021-01-31 DIAGNOSIS — I129 Hypertensive chronic kidney disease with stage 1 through stage 4 chronic kidney disease, or unspecified chronic kidney disease: Secondary | ICD-10-CM | POA: Diagnosis not present

## 2021-01-31 DIAGNOSIS — Z952 Presence of prosthetic heart valve: Secondary | ICD-10-CM | POA: Diagnosis not present

## 2021-01-31 IMAGING — DX DG CHEST 1V PORT
1 series · 1 of 1 positions shown · non-contrast
Comparison: Chest radiograph dated 11/05/2019.

CLINICAL DATA: 74-year-old female with TAVR.

EXAM:
PORTABLE CHEST 1 VIEW

[chest]
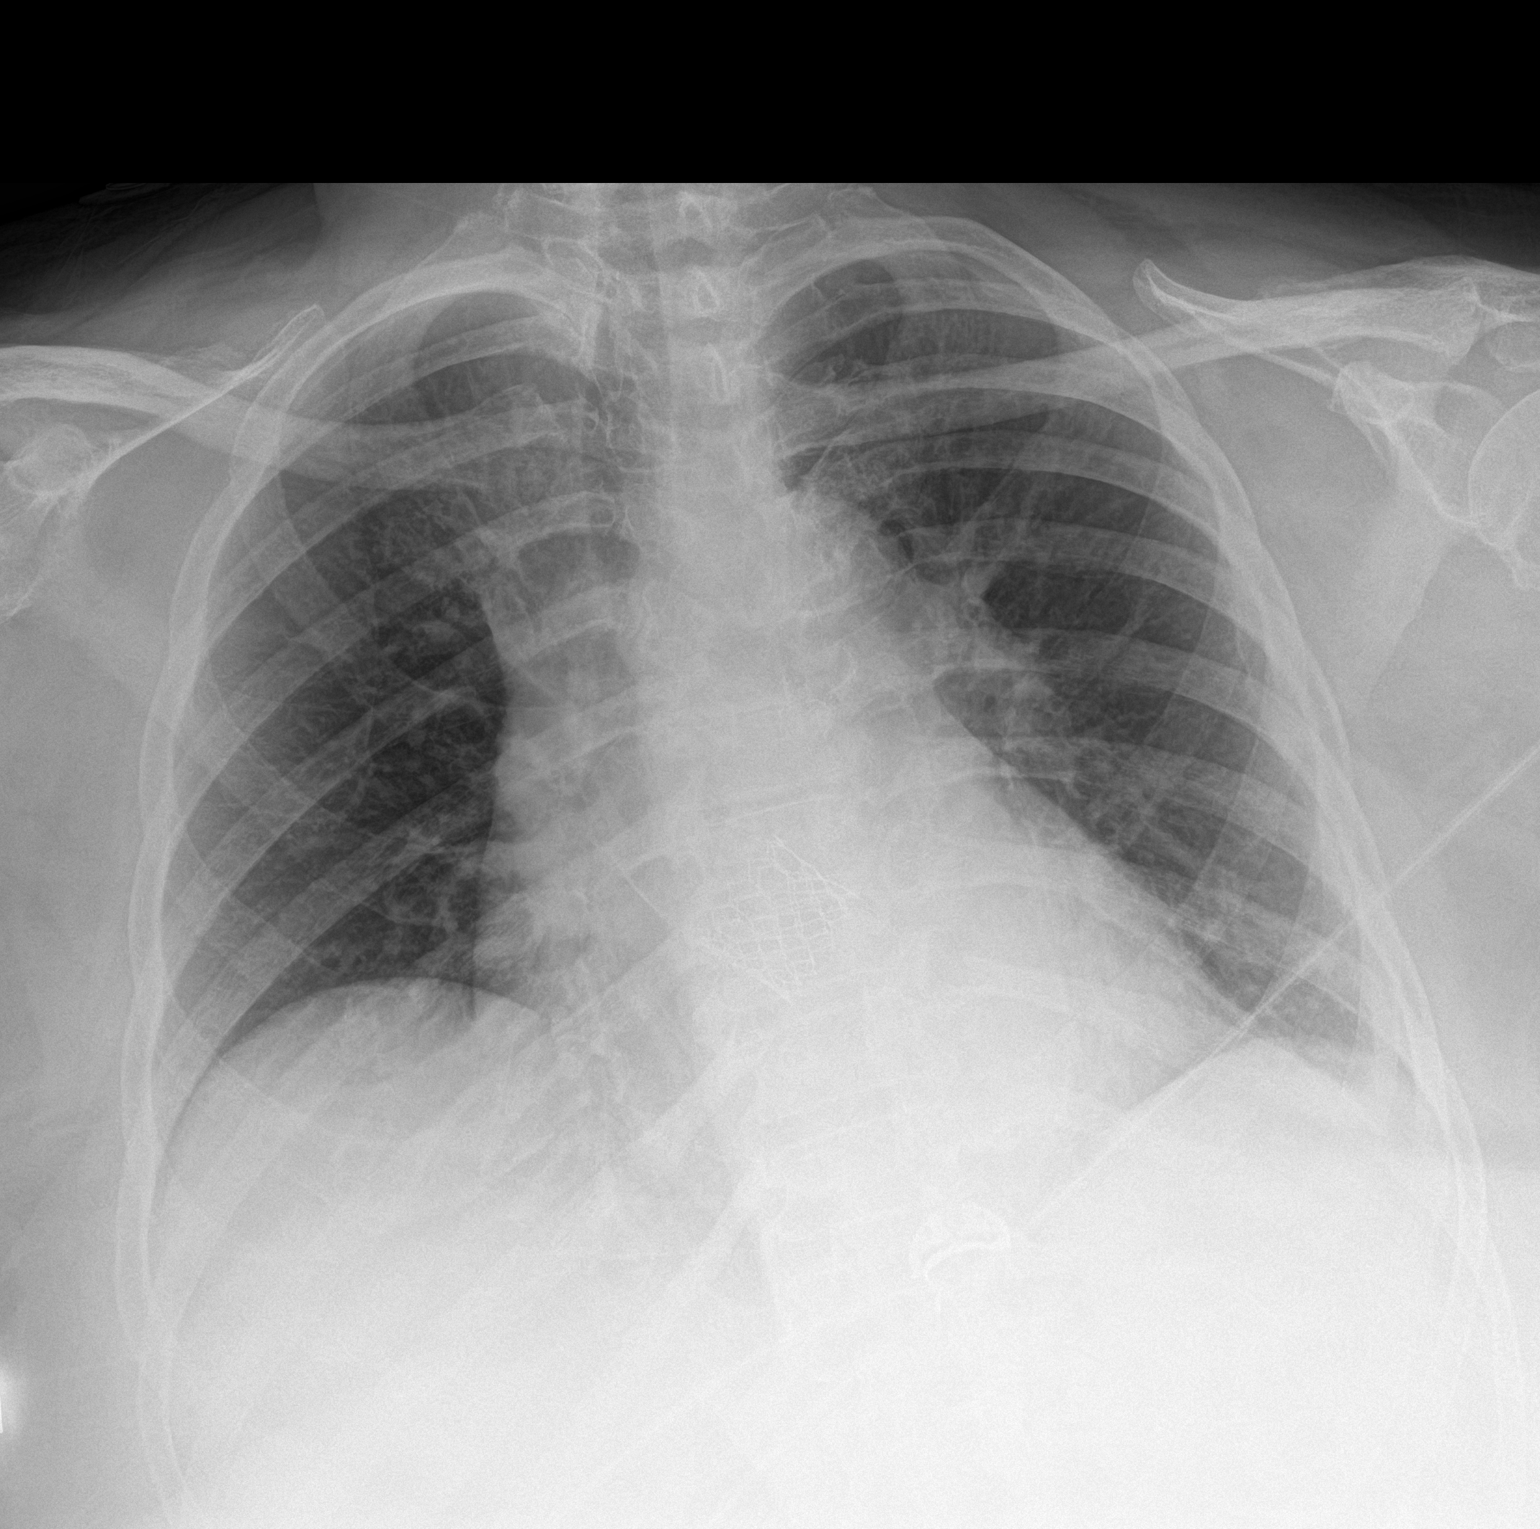

[1 of 1 positions shown; findings below may reference images not displayed]

FINDINGS: Bibasilar minimal atelectasis. No focal consolidation, pleural
effusion, or pneumothorax. Mild cardiomegaly. Aortic valve repair.
No acute osseous pathology.
IMPRESSION: Status post TAVR.  No acute findings.

## 2021-02-02 DIAGNOSIS — E1169 Type 2 diabetes mellitus with other specified complication: Secondary | ICD-10-CM | POA: Diagnosis not present

## 2021-02-02 DIAGNOSIS — I10 Essential (primary) hypertension: Secondary | ICD-10-CM | POA: Diagnosis not present

## 2021-02-05 DIAGNOSIS — Z79899 Other long term (current) drug therapy: Secondary | ICD-10-CM | POA: Diagnosis not present

## 2021-02-05 DIAGNOSIS — M19031 Primary osteoarthritis, right wrist: Secondary | ICD-10-CM | POA: Diagnosis not present

## 2021-02-05 DIAGNOSIS — E1122 Type 2 diabetes mellitus with diabetic chronic kidney disease: Secondary | ICD-10-CM | POA: Diagnosis not present

## 2021-02-05 DIAGNOSIS — G8929 Other chronic pain: Secondary | ICD-10-CM | POA: Diagnosis not present

## 2021-02-05 DIAGNOSIS — Z952 Presence of prosthetic heart valve: Secondary | ICD-10-CM | POA: Diagnosis not present

## 2021-02-05 DIAGNOSIS — D631 Anemia in chronic kidney disease: Secondary | ICD-10-CM | POA: Diagnosis not present

## 2021-02-05 DIAGNOSIS — I129 Hypertensive chronic kidney disease with stage 1 through stage 4 chronic kidney disease, or unspecified chronic kidney disease: Secondary | ICD-10-CM | POA: Diagnosis not present

## 2021-02-05 DIAGNOSIS — N1832 Chronic kidney disease, stage 3b: Secondary | ICD-10-CM | POA: Diagnosis not present

## 2021-02-05 DIAGNOSIS — Z9089 Acquired absence of other organs: Secondary | ICD-10-CM | POA: Diagnosis not present

## 2021-02-05 DIAGNOSIS — M5124 Other intervertebral disc displacement, thoracic region: Secondary | ICD-10-CM | POA: Diagnosis not present

## 2021-02-05 DIAGNOSIS — M479 Spondylosis, unspecified: Secondary | ICD-10-CM | POA: Diagnosis not present

## 2021-02-05 DIAGNOSIS — D509 Iron deficiency anemia, unspecified: Secondary | ICD-10-CM | POA: Diagnosis not present

## 2021-02-05 DIAGNOSIS — D709 Neutropenia, unspecified: Secondary | ICD-10-CM | POA: Diagnosis not present

## 2021-02-05 DIAGNOSIS — E538 Deficiency of other specified B group vitamins: Secondary | ICD-10-CM | POA: Diagnosis not present

## 2021-02-05 DIAGNOSIS — H8102 Meniere's disease, left ear: Secondary | ICD-10-CM | POA: Diagnosis not present

## 2021-02-05 DIAGNOSIS — Z85040 Personal history of malignant carcinoid tumor of rectum: Secondary | ICD-10-CM | POA: Diagnosis not present

## 2021-02-05 DIAGNOSIS — M170 Bilateral primary osteoarthritis of knee: Secondary | ICD-10-CM | POA: Diagnosis not present

## 2021-02-05 DIAGNOSIS — M542 Cervicalgia: Secondary | ICD-10-CM | POA: Diagnosis not present

## 2021-02-05 DIAGNOSIS — Z7982 Long term (current) use of aspirin: Secondary | ICD-10-CM | POA: Diagnosis not present

## 2021-02-05 DIAGNOSIS — Z87891 Personal history of nicotine dependence: Secondary | ICD-10-CM | POA: Diagnosis not present

## 2021-02-05 DIAGNOSIS — M19032 Primary osteoarthritis, left wrist: Secondary | ICD-10-CM | POA: Diagnosis not present

## 2021-02-05 DIAGNOSIS — E785 Hyperlipidemia, unspecified: Secondary | ICD-10-CM | POA: Diagnosis not present

## 2021-02-05 DIAGNOSIS — Z9181 History of falling: Secondary | ICD-10-CM | POA: Diagnosis not present

## 2021-02-06 DIAGNOSIS — N1832 Chronic kidney disease, stage 3b: Secondary | ICD-10-CM | POA: Diagnosis not present

## 2021-02-06 DIAGNOSIS — M5124 Other intervertebral disc displacement, thoracic region: Secondary | ICD-10-CM | POA: Diagnosis not present

## 2021-02-06 DIAGNOSIS — Z9089 Acquired absence of other organs: Secondary | ICD-10-CM | POA: Diagnosis not present

## 2021-02-06 DIAGNOSIS — E538 Deficiency of other specified B group vitamins: Secondary | ICD-10-CM | POA: Diagnosis not present

## 2021-02-06 DIAGNOSIS — H8102 Meniere's disease, left ear: Secondary | ICD-10-CM | POA: Diagnosis not present

## 2021-02-06 DIAGNOSIS — D709 Neutropenia, unspecified: Secondary | ICD-10-CM | POA: Diagnosis not present

## 2021-02-06 DIAGNOSIS — M19032 Primary osteoarthritis, left wrist: Secondary | ICD-10-CM | POA: Diagnosis not present

## 2021-02-06 DIAGNOSIS — Z85040 Personal history of malignant carcinoid tumor of rectum: Secondary | ICD-10-CM | POA: Diagnosis not present

## 2021-02-06 DIAGNOSIS — D509 Iron deficiency anemia, unspecified: Secondary | ICD-10-CM | POA: Diagnosis not present

## 2021-02-06 DIAGNOSIS — M170 Bilateral primary osteoarthritis of knee: Secondary | ICD-10-CM | POA: Diagnosis not present

## 2021-02-06 DIAGNOSIS — G8929 Other chronic pain: Secondary | ICD-10-CM | POA: Diagnosis not present

## 2021-02-06 DIAGNOSIS — Z79899 Other long term (current) drug therapy: Secondary | ICD-10-CM | POA: Diagnosis not present

## 2021-02-06 DIAGNOSIS — M479 Spondylosis, unspecified: Secondary | ICD-10-CM | POA: Diagnosis not present

## 2021-02-06 DIAGNOSIS — Z7982 Long term (current) use of aspirin: Secondary | ICD-10-CM | POA: Diagnosis not present

## 2021-02-06 DIAGNOSIS — E1122 Type 2 diabetes mellitus with diabetic chronic kidney disease: Secondary | ICD-10-CM | POA: Diagnosis not present

## 2021-02-06 DIAGNOSIS — M19031 Primary osteoarthritis, right wrist: Secondary | ICD-10-CM | POA: Diagnosis not present

## 2021-02-06 DIAGNOSIS — E785 Hyperlipidemia, unspecified: Secondary | ICD-10-CM | POA: Diagnosis not present

## 2021-02-06 DIAGNOSIS — I129 Hypertensive chronic kidney disease with stage 1 through stage 4 chronic kidney disease, or unspecified chronic kidney disease: Secondary | ICD-10-CM | POA: Diagnosis not present

## 2021-02-06 DIAGNOSIS — M542 Cervicalgia: Secondary | ICD-10-CM | POA: Diagnosis not present

## 2021-02-06 DIAGNOSIS — Z952 Presence of prosthetic heart valve: Secondary | ICD-10-CM | POA: Diagnosis not present

## 2021-02-06 DIAGNOSIS — Z9181 History of falling: Secondary | ICD-10-CM | POA: Diagnosis not present

## 2021-02-06 DIAGNOSIS — Z87891 Personal history of nicotine dependence: Secondary | ICD-10-CM | POA: Diagnosis not present

## 2021-02-06 DIAGNOSIS — D631 Anemia in chronic kidney disease: Secondary | ICD-10-CM | POA: Diagnosis not present

## 2021-02-07 DIAGNOSIS — M542 Cervicalgia: Secondary | ICD-10-CM | POA: Diagnosis not present

## 2021-02-07 DIAGNOSIS — M479 Spondylosis, unspecified: Secondary | ICD-10-CM | POA: Diagnosis not present

## 2021-02-07 DIAGNOSIS — M5124 Other intervertebral disc displacement, thoracic region: Secondary | ICD-10-CM | POA: Diagnosis not present

## 2021-02-07 DIAGNOSIS — D509 Iron deficiency anemia, unspecified: Secondary | ICD-10-CM | POA: Diagnosis not present

## 2021-02-07 DIAGNOSIS — G8929 Other chronic pain: Secondary | ICD-10-CM | POA: Diagnosis not present

## 2021-02-07 DIAGNOSIS — Z79899 Other long term (current) drug therapy: Secondary | ICD-10-CM | POA: Diagnosis not present

## 2021-02-07 DIAGNOSIS — N1832 Chronic kidney disease, stage 3b: Secondary | ICD-10-CM | POA: Diagnosis not present

## 2021-02-07 DIAGNOSIS — Z952 Presence of prosthetic heart valve: Secondary | ICD-10-CM | POA: Diagnosis not present

## 2021-02-07 DIAGNOSIS — Z7982 Long term (current) use of aspirin: Secondary | ICD-10-CM | POA: Diagnosis not present

## 2021-02-07 DIAGNOSIS — D631 Anemia in chronic kidney disease: Secondary | ICD-10-CM | POA: Diagnosis not present

## 2021-02-07 DIAGNOSIS — M170 Bilateral primary osteoarthritis of knee: Secondary | ICD-10-CM | POA: Diagnosis not present

## 2021-02-07 DIAGNOSIS — E785 Hyperlipidemia, unspecified: Secondary | ICD-10-CM | POA: Diagnosis not present

## 2021-02-07 DIAGNOSIS — D709 Neutropenia, unspecified: Secondary | ICD-10-CM | POA: Diagnosis not present

## 2021-02-07 DIAGNOSIS — E538 Deficiency of other specified B group vitamins: Secondary | ICD-10-CM | POA: Diagnosis not present

## 2021-02-07 DIAGNOSIS — M19031 Primary osteoarthritis, right wrist: Secondary | ICD-10-CM | POA: Diagnosis not present

## 2021-02-07 DIAGNOSIS — Z9181 History of falling: Secondary | ICD-10-CM | POA: Diagnosis not present

## 2021-02-07 DIAGNOSIS — Z87891 Personal history of nicotine dependence: Secondary | ICD-10-CM | POA: Diagnosis not present

## 2021-02-07 DIAGNOSIS — E1122 Type 2 diabetes mellitus with diabetic chronic kidney disease: Secondary | ICD-10-CM | POA: Diagnosis not present

## 2021-02-07 DIAGNOSIS — M19032 Primary osteoarthritis, left wrist: Secondary | ICD-10-CM | POA: Diagnosis not present

## 2021-02-07 DIAGNOSIS — H8102 Meniere's disease, left ear: Secondary | ICD-10-CM | POA: Diagnosis not present

## 2021-02-07 DIAGNOSIS — I129 Hypertensive chronic kidney disease with stage 1 through stage 4 chronic kidney disease, or unspecified chronic kidney disease: Secondary | ICD-10-CM | POA: Diagnosis not present

## 2021-02-07 DIAGNOSIS — Z85040 Personal history of malignant carcinoid tumor of rectum: Secondary | ICD-10-CM | POA: Diagnosis not present

## 2021-02-07 DIAGNOSIS — Z9089 Acquired absence of other organs: Secondary | ICD-10-CM | POA: Diagnosis not present

## 2021-02-15 DIAGNOSIS — M19031 Primary osteoarthritis, right wrist: Secondary | ICD-10-CM | POA: Diagnosis not present

## 2021-02-15 DIAGNOSIS — M170 Bilateral primary osteoarthritis of knee: Secondary | ICD-10-CM | POA: Diagnosis not present

## 2021-02-15 DIAGNOSIS — N1832 Chronic kidney disease, stage 3b: Secondary | ICD-10-CM | POA: Diagnosis not present

## 2021-02-15 DIAGNOSIS — M479 Spondylosis, unspecified: Secondary | ICD-10-CM | POA: Diagnosis not present

## 2021-02-15 DIAGNOSIS — E1122 Type 2 diabetes mellitus with diabetic chronic kidney disease: Secondary | ICD-10-CM | POA: Diagnosis not present

## 2021-02-15 DIAGNOSIS — E538 Deficiency of other specified B group vitamins: Secondary | ICD-10-CM | POA: Diagnosis not present

## 2021-02-15 DIAGNOSIS — M542 Cervicalgia: Secondary | ICD-10-CM | POA: Diagnosis not present

## 2021-02-15 DIAGNOSIS — M5124 Other intervertebral disc displacement, thoracic region: Secondary | ICD-10-CM | POA: Diagnosis not present

## 2021-02-15 DIAGNOSIS — Z87891 Personal history of nicotine dependence: Secondary | ICD-10-CM | POA: Diagnosis not present

## 2021-02-15 DIAGNOSIS — Z9181 History of falling: Secondary | ICD-10-CM | POA: Diagnosis not present

## 2021-02-15 DIAGNOSIS — D709 Neutropenia, unspecified: Secondary | ICD-10-CM | POA: Diagnosis not present

## 2021-02-15 DIAGNOSIS — Z79899 Other long term (current) drug therapy: Secondary | ICD-10-CM | POA: Diagnosis not present

## 2021-02-15 DIAGNOSIS — Z7982 Long term (current) use of aspirin: Secondary | ICD-10-CM | POA: Diagnosis not present

## 2021-02-15 DIAGNOSIS — Z952 Presence of prosthetic heart valve: Secondary | ICD-10-CM | POA: Diagnosis not present

## 2021-02-15 DIAGNOSIS — Z9089 Acquired absence of other organs: Secondary | ICD-10-CM | POA: Diagnosis not present

## 2021-02-15 DIAGNOSIS — D509 Iron deficiency anemia, unspecified: Secondary | ICD-10-CM | POA: Diagnosis not present

## 2021-02-15 DIAGNOSIS — I129 Hypertensive chronic kidney disease with stage 1 through stage 4 chronic kidney disease, or unspecified chronic kidney disease: Secondary | ICD-10-CM | POA: Diagnosis not present

## 2021-02-15 DIAGNOSIS — E785 Hyperlipidemia, unspecified: Secondary | ICD-10-CM | POA: Diagnosis not present

## 2021-02-15 DIAGNOSIS — M19032 Primary osteoarthritis, left wrist: Secondary | ICD-10-CM | POA: Diagnosis not present

## 2021-02-15 DIAGNOSIS — G8929 Other chronic pain: Secondary | ICD-10-CM | POA: Diagnosis not present

## 2021-02-15 DIAGNOSIS — H8102 Meniere's disease, left ear: Secondary | ICD-10-CM | POA: Diagnosis not present

## 2021-02-15 DIAGNOSIS — D631 Anemia in chronic kidney disease: Secondary | ICD-10-CM | POA: Diagnosis not present

## 2021-02-15 DIAGNOSIS — Z85040 Personal history of malignant carcinoid tumor of rectum: Secondary | ICD-10-CM | POA: Diagnosis not present

## 2021-02-19 DIAGNOSIS — M3505 Sjogren syndrome with inflammatory arthritis: Secondary | ICD-10-CM | POA: Insufficient documentation

## 2021-02-19 DIAGNOSIS — M159 Polyosteoarthritis, unspecified: Secondary | ICD-10-CM | POA: Diagnosis not present

## 2021-02-19 DIAGNOSIS — M5124 Other intervertebral disc displacement, thoracic region: Secondary | ICD-10-CM | POA: Diagnosis not present

## 2021-02-19 DIAGNOSIS — Z7960 Long term (current) use of unspecified immunomodulators and immunosuppressants: Secondary | ICD-10-CM | POA: Diagnosis not present

## 2021-02-19 DIAGNOSIS — D72819 Decreased white blood cell count, unspecified: Secondary | ICD-10-CM | POA: Diagnosis not present

## 2021-02-19 DIAGNOSIS — R768 Other specified abnormal immunological findings in serum: Secondary | ICD-10-CM | POA: Diagnosis not present

## 2021-02-21 DIAGNOSIS — M170 Bilateral primary osteoarthritis of knee: Secondary | ICD-10-CM | POA: Diagnosis not present

## 2021-02-21 DIAGNOSIS — M19031 Primary osteoarthritis, right wrist: Secondary | ICD-10-CM | POA: Diagnosis not present

## 2021-02-21 DIAGNOSIS — Z87891 Personal history of nicotine dependence: Secondary | ICD-10-CM | POA: Diagnosis not present

## 2021-02-21 DIAGNOSIS — I129 Hypertensive chronic kidney disease with stage 1 through stage 4 chronic kidney disease, or unspecified chronic kidney disease: Secondary | ICD-10-CM | POA: Diagnosis not present

## 2021-02-21 DIAGNOSIS — D509 Iron deficiency anemia, unspecified: Secondary | ICD-10-CM | POA: Diagnosis not present

## 2021-02-21 DIAGNOSIS — E1122 Type 2 diabetes mellitus with diabetic chronic kidney disease: Secondary | ICD-10-CM | POA: Diagnosis not present

## 2021-02-21 DIAGNOSIS — M542 Cervicalgia: Secondary | ICD-10-CM | POA: Diagnosis not present

## 2021-02-21 DIAGNOSIS — N1832 Chronic kidney disease, stage 3b: Secondary | ICD-10-CM | POA: Diagnosis not present

## 2021-02-21 DIAGNOSIS — Z9089 Acquired absence of other organs: Secondary | ICD-10-CM | POA: Diagnosis not present

## 2021-02-21 DIAGNOSIS — Z9181 History of falling: Secondary | ICD-10-CM | POA: Diagnosis not present

## 2021-02-21 DIAGNOSIS — Z952 Presence of prosthetic heart valve: Secondary | ICD-10-CM | POA: Diagnosis not present

## 2021-02-21 DIAGNOSIS — E538 Deficiency of other specified B group vitamins: Secondary | ICD-10-CM | POA: Diagnosis not present

## 2021-02-21 DIAGNOSIS — M5124 Other intervertebral disc displacement, thoracic region: Secondary | ICD-10-CM | POA: Diagnosis not present

## 2021-02-21 DIAGNOSIS — Z79899 Other long term (current) drug therapy: Secondary | ICD-10-CM | POA: Diagnosis not present

## 2021-02-21 DIAGNOSIS — H8102 Meniere's disease, left ear: Secondary | ICD-10-CM | POA: Diagnosis not present

## 2021-02-21 DIAGNOSIS — Z85040 Personal history of malignant carcinoid tumor of rectum: Secondary | ICD-10-CM | POA: Diagnosis not present

## 2021-02-21 DIAGNOSIS — E785 Hyperlipidemia, unspecified: Secondary | ICD-10-CM | POA: Diagnosis not present

## 2021-02-21 DIAGNOSIS — G8929 Other chronic pain: Secondary | ICD-10-CM | POA: Diagnosis not present

## 2021-02-21 DIAGNOSIS — D709 Neutropenia, unspecified: Secondary | ICD-10-CM | POA: Diagnosis not present

## 2021-02-21 DIAGNOSIS — Z7982 Long term (current) use of aspirin: Secondary | ICD-10-CM | POA: Diagnosis not present

## 2021-02-21 DIAGNOSIS — M479 Spondylosis, unspecified: Secondary | ICD-10-CM | POA: Diagnosis not present

## 2021-02-21 DIAGNOSIS — D631 Anemia in chronic kidney disease: Secondary | ICD-10-CM | POA: Diagnosis not present

## 2021-02-21 DIAGNOSIS — M19032 Primary osteoarthritis, left wrist: Secondary | ICD-10-CM | POA: Diagnosis not present

## 2021-02-22 ENCOUNTER — Ambulatory Visit (INDEPENDENT_AMBULATORY_CARE_PROVIDER_SITE_OTHER): Payer: Medicare Other

## 2021-02-22 ENCOUNTER — Telehealth: Payer: Medicare Other | Admitting: General Practice

## 2021-02-22 DIAGNOSIS — M159 Polyosteoarthritis, unspecified: Secondary | ICD-10-CM

## 2021-02-22 DIAGNOSIS — M150 Primary generalized (osteo)arthritis: Secondary | ICD-10-CM

## 2021-02-22 DIAGNOSIS — M3505 Sjogren syndrome with inflammatory arthritis: Secondary | ICD-10-CM

## 2021-02-22 DIAGNOSIS — E7800 Pure hypercholesterolemia, unspecified: Secondary | ICD-10-CM

## 2021-02-22 DIAGNOSIS — M79641 Pain in right hand: Secondary | ICD-10-CM

## 2021-02-22 DIAGNOSIS — E1169 Type 2 diabetes mellitus with other specified complication: Secondary | ICD-10-CM

## 2021-02-22 DIAGNOSIS — I10 Essential (primary) hypertension: Secondary | ICD-10-CM

## 2021-02-22 DIAGNOSIS — I5032 Chronic diastolic (congestive) heart failure: Secondary | ICD-10-CM

## 2021-02-22 DIAGNOSIS — M79642 Pain in left hand: Secondary | ICD-10-CM

## 2021-02-22 DIAGNOSIS — G5602 Carpal tunnel syndrome, left upper limb: Secondary | ICD-10-CM

## 2021-02-22 NOTE — Chronic Care Management (AMB) (Signed)
Chronic Care Management   CCM RN Visit Note  02/22/2021 Name: Diana Harmon MRN: [id number] DOB: Feb 13, 1946  Subjective: Diana Harmon is a 75 y.o. year old female who is a primary care patient of [name], DO. The care management team was consulted for assistance with disease management and care coordination needs.    Engaged with patient by telephone for follow up visit in response to provider referral for case management and/or care coordination services.   Consent to Services:  The patient was given information about Chronic Care Management services, agreed to services, and gave verbal consent prior to initiation of services.  Please see initial visit note for detailed documentation.   Patient agreed to services and verbal consent obtained.   Assessment: Review of patient past medical history, allergies, medications, health status, including review of consultants reports, laboratory and other test data, was performed as part of comprehensive evaluation and provision of chronic care management services.   SDOH (Social Determinants of Health) assessments and interventions performed:  SDOH Interventions    Flowsheet Row Most Recent Value  SDOH Interventions   Food Insecurity Interventions Intervention Not Indicated  Financial Strain Interventions Intervention Not Indicated  Housing Interventions Intervention Not Indicated  Intimate Partner Violence Interventions Intervention Not Indicated  Physical Activity Interventions Other (Comments)  [The patient is doing exercises the PT has showed her]  Stress Interventions Intervention Not Indicated  Social Connections Interventions Other (Comment)  [good support system]  Transportation Interventions Intervention Not Indicated        CCM Care Plan  Allergies  Allergen Reactions   Ferumoxytol Swelling    Swelling in neck   Iron    Other     Other reaction(s): Other (See Comments) She refuses  blood products-Jehovah witness   Penicillins Rash, Swelling and Hives    Outpatient Encounter Medications as of 02/22/2021  Medication Sig   ACCU-CHEK GUIDE test strip Use to check blood sugar twice daily   Accu-Chek Softclix Lancets lancets Use to check blood sugar twice daily   acetaminophen (TYLENOL) 500 MG tablet Take 1,000 mg by mouth every 6 (six) hours as needed for moderate pain.    Artificial Tear Solution (GENTEAL TEARS) 0.1-0.2-0.3 % SOLN Place 1 drop into both eyes 3 (three) times daily as needed (dry/irritated eyes.).   aspirin 81 MG chewable tablet Chew 1 tablet (81 mg total) by mouth daily.   B COMPLEX-C PO Take 10 mLs by mouth daily.   baclofen (LIORESAL) 10 MG tablet TAKE 1 TO 2 TABLETS BY MOUTH TWICE DAILY AS NEEDED FOR MUSCLE SPASM   Blood Glucose Monitoring Suppl (ACCU-CHEK GUIDE) w/Device KIT Use to check blood sugar twice daily   Cholecalciferol (VITAMIN D3) 125 MCG (5000 UT) TABS Take 5,000 Units by mouth daily.   Ferrous Sulfate (IRON) 325 (65 Fe) MG TABS TAKE 1 TABLET BY MOUTH TWICE DAILY WITH A MEAL   furosemide (LASIX) 20 MG tablet Take 1 tablet (20 mg total) by mouth daily as needed for edema.   hydrochlorothiazide (HYDRODIURIL) 25 MG tablet Take 1 tablet (25 mg total) by mouth daily.   lactobacillus acidophilus (BACID) TABS tablet Take 2 tablets by mouth 2 (two) times daily as needed (digestive health.).   levocetirizine (XYZAL) 5 MG tablet TAKE 1 TABLET BY MOUTH ONCE DAILY IN THE EVENING (Patient taking differently: Take 1/2 tablet (2.5 mg) daily in the evening as needed for allergies)   losartan (COZAAR) 50 MG tablet Take 1 tablet (50 mg total) by  mouth daily.   magnesium oxide (MAG-OX) 400 MG tablet Take 400 mg by mouth daily.   SHINGRIX injection Inject 0.5 mL into muscle for shingles vaccine. Repeat dose in 2-6 months.   simvastatin (ZOCOR) 20 MG tablet TAKE 1 TABLET BY MOUTH ONCE DAILY AT  6  PM   TRULICITY 8.14 GY/1.8HU SOPN Inject 0.75 mg into the skin  once a week.   [DISCONTINUED] Cromolyn Sodium (NASAL ALLERGY NA) Place 1 spray into the nose daily as needed (allergies).   No facility-administered encounter medications on file as of 02/22/2021.    Patient Active Problem List   Diagnosis Date Noted   S/P TAVR (transcatheter aortic valve replacement) 11/09/2019   Patient is [name]    Severe aortic stenosis    Thyroid disease    Chronic heart failure with preserved ejection fraction (HFpEF) ([place]) 09/30/2019   Normocytic anemia 02/17/2019   Morbid obesity ([place]) 11/05/2017   Neutropenia ([place]) 04/24/2017   Vitamin D deficiency 10/08/2016   Meniere's disease of right ear 07/04/2016   TIA (transient ischemic attack) 07/04/2016   Nonrheumatic aortic valve stenosis 07/04/2016   Seasonal allergies 01/30/2016   Recurrent UTI 10/26/2015   Iron deficiency anemia due to chronic blood loss 02/28/2015   History of adenomatous polyp of colon 02/13/2015   Osteoarthritis of both knees 12/05/2014   Acid reflux 12/05/2014   Benign neoplasm of colon 12/05/2014   Type 2 diabetes mellitus with other specified complication ([place]) 31/49/7026   Essential hypertension 12/05/2014   Hyperlipidemia associated with type 2 diabetes mellitus ([place]) 12/05/2014   Arthritis, degenerative 12/05/2014   Anemia due to multiple mechanisms 12/05/2014   Abnormal liver enzymes 12/05/2014    Conditions to be addressed/monitored:CHF, HTN, HLD, DMII, Osteoarthritis, and Chronic Pain  Care Plan : RNCM: Heart Failure (Adult)  Updates made by Vanita Ingles, RN since 02/22/2021 12:00 AM  Completed 02/22/2021   Problem: RNCM: Management of HF Resolved 02/22/2021  Priority: Medium     Long-Range Goal: RNCM: HF Symptom Exacerbation Prevented or Minimized Completed 02/22/2021  Start Date: 05/22/2020  Expected End Date: 12/02/2021  Recent Progress: On track  Priority: Medium  Note:   Current Barriers: Resolving, duplicate goal Wt Readings from Last 3  Encounters:  11/23/20 230 lb 9.6 oz (104.6 kg)  11/09/20 230 lb 12.8 oz (104.7 kg)  09/23/20 231 lb (104.8 kg)     Knowledge deficits related to basic heart failure pathophysiology and self care management Unable to independently manage HF Does not contact provider office for questions/concerns Lack of scale in home- weighs every other day- 12-07-2020: Discussed checking weights daily, especially when she is having edema in her feet and legs.  Financial strain  Occupational hygienist):  patient will weigh self daily and record  patient will verbalize understanding of Heart Failure Action Plan and when to call doctor  patient will take all Heart Failure mediations as prescribed Interventions:  Collaboration with [name], DO regarding development and update of comprehensive plan of care as evidenced by provider attestation and co-signature Inter-disciplinary care team collaboration (see longitudinal plan of care) Basic overview and discussion of pathophysiology of Heart Failure. 12-07-2020: Review of HF and how to tell subtle changes that cause exacerbation.  Provided written and verbal education on low sodium diet- 12-07-2020: Review of heart healthy/ADA diet and how water follows sodium and the need to monitor sodium content in foods.  Reviewed Heart Failure Action Plan in depth and provided written copy- 12-07-2020:  Discussed with patient talking to DR. End about action plan. She has been taking her diuretic; however she has been taking x 3 days and the edema is not better.  Assessed for scales in home Discussed importance of daily weight- 12-07-2020- reviewed and ask the patient to weigh daily.  Reviewed role of diuretics in prevention of fluid overload. 09-25-2020: The patient is compliant with Lasix, has some edema noted in his feet or legs. Is having some swelling in her hands but likely from OA pain. Will see specialist for that in June. 12-07-2020: Has had swelling in  her feet and legs x 3 days. Is taking Lasix. Recommended she call the provider if edema is not resolved. Extensive education and support given.   Patient Goals/Self-Care Activities Over the next 120 days, patient will:  - Take Heart Failure Medications as prescribed - Weigh daily and record (notify MD with 3 lb weight gain over night or 5 lb in a week)- 12-07-2020- reviewed  - Follow CHF Action Plan- 12-07-2020: Following plan of care - Adhere to low sodium diet- 12-07-2020: The patient states she sometimes slips and doesn't eat right - barriers to lifestyle changes reviewed and addressed - barriers to treatment reviewed and addressed - self-awareness of signs/symptoms of worsening disease encouraged Follow Up Plan: Telephone follow up appointment with care management team member scheduled for: 02-22-2021 at 345 pm    Care Plan : RNCM: Hypertension (Adult)  Updates made by Vanita Ingles, RN since 02/22/2021 12:00 AM  Completed 02/22/2021   Problem: RNCM: Hypertension (Hypertension) Resolved 02/22/2021  Priority: Medium     Long-Range Goal: RNCM: Hypertension Monitored Completed 02/22/2021  Start Date: 05/22/2020  Expected End Date: 12/02/2021  Recent Progress: On track  Priority: Medium  Note:   Objective: Resolving duplicate goal Last practice recorded BP readings:  BP Readings from Last 3 Encounters:  11/23/20 (!) 139/57  11/09/20 120/68  09/25/20 (!) 125/58      Most recent eGFR/CrCl: No results found for: EGFR  No components found for: CRCL Current Barriers:  Knowledge Deficits related to basic understanding of hypertension pathophysiology and self care management Knowledge Deficits related to understanding of medications prescribed for management of hypertension Limited Social Support Unable to independently manage HTN Lacks social connections Does not contact provider office for questions/concerns Case Manager Clinical Goal(s):  patient will verbalize understanding of plan  for hypertension management  patient will demonstrate improved adherence to prescribed treatment plan for hypertension as evidenced by taking all medications as prescribed, monitoring and recording blood pressure as directed, adhering to low sodium/DASH diet  patient will demonstrate improved health management independence as evidenced by checking blood pressure as directed and notifying PCP if SBP>160 or DBP > 90, taking all medications as prescribe, and adhering to a low sodium diet as discussed. Interventions:  Collaboration with [name], DO regarding development and update of comprehensive plan of care as evidenced by provider attestation and co-signature Inter-disciplinary care team collaboration (see longitudinal plan of care) Evaluation of current treatment plan related to hypertension self management and patient's adherence to plan as established by provider. 12-07-2020: The patient states that she is consistently taking her blood pressure. Discussed systolic <428 or <76 diastolic. The patient little concerned at diastolic 57.  Looking at her baseline she is consistently around 56 to 58. Education and support given. Several reading provided today. She is consistently staying in 81'L diastolic and 572 to 620'B systolic. Will continue to monitor. Praised for keeping record of  blood pressures.  Provided education to patient re: stroke prevention, s/s of heart attack and stroke, DASH diet, complications of uncontrolled blood pressure. 12-07-2020: Discussed heart healthy/ADA diet and compliance. The patient is trying to eat better and monitor her dietary restrictions.  Evaluation of blood pressure readings. 12-07-2020: The patient states her blood pressure is doing well. Last reading was 117/54  pulse 61 at home today. She is taking more frequently and recording.  Reviewed medications with patient and discussed importance of compliance. 12-07-2020: The patient is compliant with medications  regimen. The patient denies issues with medications  Discussed plans with patient for ongoing care management follow up and provided patient with direct contact information for care management team Advised patient, providing education and rationale, to monitor blood pressure daily and record, calling PCP for findings outside established parameters.  Patient Goals/Self-Care Activities Over the next 120 days, patient will:  - - blood pressure trends reviewed - depression screen reviewed - home or ambulatory blood pressure monitoring encouraged  Follow Up Plan: Telephone follow up appointment with care management team member scheduled for: 02-22-2021 at 345 pm    Care Plan : RNCM: Diabetes Type 2 (Adult)  Updates made by Vanita Ingles, RN since 02/22/2021 12:00 AM  Completed 02/22/2021   Problem: RNCM: Glycemic Management (Diabetes, Type 2) Resolved 02/22/2021  Priority: Medium     Long-Range Goal: RNCM: Glycemic Management Optimized Completed 02/22/2021  Start Date: 05/22/2020  Expected End Date: 12/02/2021  Recent Progress: On track  Priority: Medium  Note:   Objective: Resolving, duplicate goal Lab Results  Component Value Date   HGBA1C 7.4 10/04/2020     Lab Results  Component Value Date   CREATININE 1.28 (H) 07/13/2020   CREATININE 1.36 (H) 05/03/2020   CREATININE 1.19 (H) 01/14/2020    No results found for: EGFR Current Barriers:  Knowledge Deficits related to basic Diabetes pathophysiology and self care/management Knowledge Deficits related to medications used for management of diabetes Limited Social Support Unable to independently manage DM Lacks social connections Does not contact provider office for questions/concerns Case Manager Clinical Goal(s):  Collaboration with [name], DO regarding development and update of comprehensive plan of care as evidenced by provider attestation and co-signature Inter-disciplinary care team collaboration (see  longitudinal plan of care) patient will demonstrate improved adherence to prescribed treatment plan for diabetes self care/management as evidenced by:  daily monitoring and recording of CBG, 12-07-2020: The patient is checking her blood sugars consistently and range has been 115 to 161 the highest. Usually highest is around 149. Education and support given. The patient states she is so thankful for the help and support of the CCM team.  adherence to ADA/ carb modified diet adherence to prescribed medication regimen Interventions:  Provided education to patient about basic DM disease process Reviewed medications with patient and discussed importance of medication adherence. 12-07-2020: Is compliant with medications, denies any issues with cost constraints. Is so thankful for pcp and pharm D support to get help with her medications cost.  Discussed plans with patient for ongoing care management follow up and provided patient with direct contact information for care management team Provided patient with written educational materials related to hypo and hyperglycemia and importance of correct treatment. 12-07-2020: Denies any low blood sugars. Her range is 115 to 161.  She is eating well and monitoring dietary intake.  Review of patient status, including review of consultants reports, relevant laboratory and other test results, and medications completed. Patient Goals/Self-Care Activities  Over the next 120 days, patient will:  - Self administers oral medications as prescribed Attends all scheduled provider appointments Checks blood sugars as prescribed and utilize hyper and hypoglycemia protocol as needed Adheres to prescribed ADA/carb modified - barriers to adherence to treatment plan identified - blood glucose readings reviewed- range is 115 to 161 - mutual A1C goal set or reviewed - resources required to improve adherence to care identified - self-awareness of signs/symptoms of hypo or hyperglycemia  encouraged - use of blood glucose monitoring log promoted Follow Up Plan: Telephone follow up appointment with care management team member scheduled for: 02-22-2021 at 345 pm    Care Plan : RNCM: HLD managment  Updates made by Vanita Ingles, RN since 02/22/2021 12:00 AM  Completed 02/22/2021   Problem: RNCM: HLD management Resolved 02/22/2021  Priority: Medium     Long-Range Goal: RNCM: HLD Health Literacy Improved Completed 02/22/2021  Start Date: 05/22/2020  Expected End Date: 12/02/2021  Recent Progress: On track  Priority: Medium  Note:   Current Barriers: Resolving, duplicate goal Poorly controlled hyperlipidemia, complicated by HTN, DM Current antihyperlipidemic regimen: Zocor 54m QD Most recent lipid panel:     Component Value Date/Time   CHOL 170 05/03/2020 0935   TRIG 103 05/03/2020 0935   HDL 54 05/03/2020 0935   CHOLHDL 3.1 05/03/2020 0935   CHOLHDL 2.4 07/21/2017 0000   VLDL 26 06/03/2016 0001   LDLCALC 97 05/03/2020 0935   LDLCALC 77 07/21/2017 0000   ASCVD risk enhancing conditions: age >>68 DM, HTN, CHF Unable to independently manage HLD  Lacks social connections Does not contact provider office for questions/concerns  RN Care Manager Clinical Goal(s):   patient will work with RConsulting civil engineer providers, and care team towards execution of optimized self-health management plan  patient will verbalize understanding of plan for effective management of HLD  patient will work with RNCM and pcp to address needs related to compliance, heart healthy diet, and working with the CCM team for optimal health and well being.   Interventions: Collaboration with KOlin Hauser DO regarding development and update of comprehensive plan of care as evidenced by provider attestation and co-signature Inter-disciplinary care team collaboration (see longitudinal plan of care) Medication review performed; medication list updated in electronic medical record.   Inter-disciplinary care team collaboration (see longitudinal plan of care) Referred to pharmacy team for assistance with HLD medication management Evaluation of current treatment plan related to HLD and patient's adherence to plan as established by provider. 12-07-2020: Denies any issues related to HLD. States compliance with heart healthy/ADA diet  Advised patient to call the office for changes or questions   Patient Goals/Self-Care Activities: Over the next 120 days, patient will:   - call for medicine refill 2 or 3 days before it runs out - call if I am sick and can't take my medicine - keep a list of all the medicines I take; vitamins and herbals too - learn to read medicine labels - use a pillbox to sort medicine - drink 6 to 8 glasses of water each day - eat 5 or 6 small meals each day - fill half the plate with nonstarchy vegetables - limit fast food meals to no more than 1 per week - manage portion size - read food labels for fat, fiber, carbohydrates and portion size - be open to making changes - I can manage, know and watch for signs of a heart attack - if I have chest pain, call for  help - learn about small changes that will make a big difference - learn my personal risk factors  - patient's preferred learning methods utilized - questions encouraged - readiness to learn monitored  Follow Up Plan: Telephone follow up appointment with care management team member scheduled for: 02-22-2021 at 345 pm      Care Plan : RNCM: Osteoarthritis (Adult)  Updates made by Vanita Ingles, RN since 02/22/2021 12:00 AM  Completed 02/22/2021   Problem: RNCM: Pain (Osteoarthritis) lower back, knees, hands and sometimes feet Resolved 02/22/2021  Priority: High     Long-Range Goal: RNCM: Manage Pain Completed 02/22/2021  Start Date: 05/22/2020  Expected End Date: 12/02/2021  Recent Progress: On track  Priority: High  Note:   Current Barriers: Resolving, duplicate goal Knowledge  Deficits related to managing acute/chronic pain Non-adherence to scheduled provider appointments Non-adherence to prescribed medication regimen Difficulty obtaining medications Chronic Disease Management support and education needs related to chronic pain Unable to independently manage new onset of pain in bilateral hands  Does not contact provider office for questions/concerns Nurse Case Manager Clinical Goal(s):  patient will verbalize understanding of plan for managing pain. 12-07-2020:The patient is concerned about this pain and discomfort. Is hopeful the specialist will have some answers. Sees specialist again on 01-04-2021 after testing and other things she has been trying.  patient will attend all scheduled medical appointments: The patient sees the specialist on 01-04-2021 patient will demonstrate use of different relaxation  skills and/or diversional activities to assist with pain reduction (distraction, imagery, relaxation, massage, acupressure, TENS, heat, and cold application patient will report pain at a level less than 3 to 4 on a 10-10 rating scale patient will use pharmacological and nonpharmacological pain relief strategies patient will verbalize acceptable level of pain relief and ability to engage in desired activities patient will engage in desired activities without an increase in pain level Interventions:  Collaboration with [name], DO regarding development and update of comprehensive plan of care as evidenced by provider attestation and co-signature Inter-disciplinary care team collaboration (see longitudinal plan of care) - careful application of heat or ice encouraged - deep breathing, relaxation and mindfulness use promoted - effectiveness of pharmacologic therapy monitored- 12-07-2020: The muscle relaxer's are helping  - medication-induced side effects managed - misuse of pain medication assessed - motivation and barriers to change assessed and addressed -  mutually acceptable comfort goal set - pain assessed- 12-07-2020: States she is still having a lot of pain in her hands and now her knees. She says the specialist thinks that it is carpal tunnel but is not sure. Is doing testing.  - pain treatment goals reviewed- 12-07-2020: The patient wants to know how to best control her pain and is open to options.  - premedication prior to activity encouraged Evaluation of current treatment plan related to pain and discomfort  and patient's adherence to plan as established by provider. 09-25-2020: The patient is using topical pain cream. States her hands are "drawing" and it is both hands, sometimes her left is worse. She now is having pain in her knees also. The muscle relaxer's help some. The patient sees the specialist again 01-04-2021. She takes tylenol for pain relief. Heat does not help.  Advised patient to call the office for changes in level or intensity of pain and discomfort Provided education to patient re: alternate pain relief measures. Review of neuropathy pain and if the patient has had any injuries to the area. Several years ago the  patient had surgery on her left hand/wrist for carpal tunnel syndrome.  Reviewed medications with patient and discussed compliance.12-07-2020 The patient takes extra strength Tylenol for pain relief and also use cream. She also uses heat at times.  Collaborated with pcp regarding new concern of patients hands feeling like they are "drawing".  The patient states that her left hand is worse than her right. She says she is putting creams on it but that does not help. Ask for recommendations from the pcp. Advised she may need to come in for evaluation by pcp.  Discussed plans with patient for ongoing care management follow up and provided patient with direct contact information for care management team Allow patient to maintain a diary of pain ratings, timing, precipitating events, medications, treatments, and what works best to relieve  pain,  Refer to support groups and self-help groups Educate patient about the use of pharmacological interventions for pain management- antianxiety, antidepressants, NSAIDS, opioid analgesics,  Explain the importance of lifestyle modifications to effective pain management  Patient Goals/Self Care Activities:  - healthy lifestyle promoted - medication side effects managed - multimodal pain management plan developed - pain assessed - response to pain management plan monitored - response to pharmacologic therapy monitored - support and encouragement provided Self-administers medications as prescribed Attends all scheduled provider appointments Calls pharmacy for medication refills Calls provider office for new concerns or questions Follow Up Plan: Telephone follow up appointment with care management team member scheduled for: 02-22-2021 at 345 pm      Care Plan : RNCM; General Plan of Care (Adult) for Chronic Disease Management and Care Coordination Needs  Updates made by Vanita [place], RN since 02/22/2021 12:00 AM     Problem: RNCM: Development of Plan of care for Chronic Disease Management (HTN/HLD/HF/DM/Chronic pain)   Priority: High     Long-Range Goal: RNCM: Effective Management of Plan of care for Chronic Disease Management (HTN/HLD/HF/DM/Chronic pain)   Start Date: 02/22/2021  Expected End Date: 02/22/2022  Priority: High  Note:   Current Barriers:  Knowledge Deficits related to plan of care for management of CHF, HTN, HLD, DMII, and Chronic pain   Chronic Disease Management support and education needs related to CHF, HTN, HLD, DMII, and Chronic Pain  RNCM Clinical Goal(s):  Patient will verbalize understanding of plan for management of CHF, HTN, HLD, DMII, and Chronic Pain   verbalize basic understanding of CHF, HTN, HLD, DMII, Osteoarthritis, and Chronic Pain disease process and self health management plan   take all medications exactly as prescribed and will call  provider for medication related questions demonstrate understanding of rationale for each prescribed medication   attend all scheduled medical appointments: 04-25-2021 at 0840 am demonstrate improved and ongoing adherence to prescribed treatment plan for CHF, HTN, HLD, DMII, Osteoarthritis, and Chronic Pain as evidenced by daily monitoring and recording of CBG  adherence to ADA/ carb modified diet adherence to prescribed medication regimen contacting provider for new or worsened symptoms or questions   demonstrate improved and ongoing health management independence   demonstrate a decrease in CHF, HTN, HLD, DMII, Osteoarthritis, and chronic pain exacerbations   demonstrate ongoing self health care management ability of effective management of chronic conditions  through collaboration with RN Care manager, provider, and care team.   Interventions: 1:1 collaboration with primary care provider regarding development and update of comprehensive plan of care as evidenced by provider attestation and co-signature Inter-disciplinary care team collaboration (see longitudinal plan of care) Evaluation of current treatment  plan related to  self management and patient's adherence to plan as established by provider   SDOH Barriers (Status: Goal on track: YES.)  Patient interviewed and SDOH assessment performed        SDOH Interventions    Flowsheet Row Most Recent Value  SDOH Interventions   Food Insecurity Interventions Intervention Not Indicated  Financial Strain Interventions Intervention Not Indicated  Housing Interventions Intervention Not Indicated  Intimate Partner Violence Interventions Intervention Not Indicated  Physical Activity Interventions Other (Comments)  [The patient is doing exercises the PT has showed her]  Stress Interventions Intervention Not Indicated  Social Connections Interventions Other (Comment)  [good support system]  Transportation Interventions Intervention Not Indicated      Patient interviewed and appropriate assessments performed Provided patient with information about resources available for changes in SDOH and care guides available for any needs or resources in [facility] Discussed plans with patient for ongoing care management follow up and provided patient with direct contact information for care management team Advised patient to call the office for changes in SDOH, questions, or concerns    Heart Failure Interventions:  (Status: Goal on track: YES.) Basic overview and discussion of pathophysiology of Heart Failure reviewed; Provided education on low sodium diet; Reviewed Heart Failure Action Plan in depth and provided written copy; Assessed need for readable accurate scales in home; Provided education about placing scale on hard, flat surface; Advised patient to weigh each morning after emptying bladder; Discussed importance of daily weight and advised patient to weigh and record daily; Reviewed role of diuretics in prevention of fluid overload and management of heart failure; Discussed the importance of keeping all appointments with provider; Provided patient with education about the role of exercise in the management of heart failure;  Diabetes:  (Status: Goal on track: YES.) Lab Results  Component Value Date   HGBA1C 7.4 10/04/2020  Assessed patient's understanding of A1c goal: <7% Provided education to patient about basic DM disease process; Reviewed medications with patient and discussed importance of medication adherence;        Reviewed prescribed diet with patient heart healthy/ADA diet ; Counseled on importance of regular laboratory monitoring as prescribed;        Discussed plans with patient for ongoing care management follow up and provided patient with direct contact information for care management team;      Provided patient with written educational materials related to hypo and hyperglycemia and importance of correct  treatment. 02-22-2021: The patient states that she has not had any lows;       Reviewed scheduled/upcoming provider appointments including: 04-25-2021 at 0840 am;         Advised patient, providing education and rationale, to check cbg as directed and record. 02-22-2021: The patient states that her glucose readings have been good. 120-162 with the highest in the last two weeks of 147.         call provider for findings outside established parameters;       Referral made to pharmacy team for assistance with ongoing support and education with medication management;       Review of patient status, including review of consultants reports, relevant laboratory and other test results, and medications completed;       Screening for signs and symptoms of depression related to chronic disease state;        Assessed social determinant of health barriers;         Hyperlipidemia:  (Status: Goal on track: YES.) Lab  Results  Component Value Date   CHOL 170 05/03/2020   HDL 54 05/03/2020   LDLCALC 97 05/03/2020   TRIG 103 05/03/2020   CHOLHDL 3.1 05/03/2020     Medication review performed; medication list updated in electronic medical record.  Provider established cholesterol goals reviewed; Counseled on importance of regular laboratory monitoring as prescribed; Provided HLD educational materials; Reviewed role and benefits of statin for ASCVD risk reduction; Discussed strategies to manage statin-induced myalgias; Reviewed importance of limiting foods high in cholesterol;  Hypertension: (Status: Goal on track: YES.) Last practice recorded BP readings:  BP Readings from Last 3 Encounters:  01/11/21 136/69  11/23/20 (!) 139/57  11/09/20 120/68  Most recent eGFR/CrCl: No results found for: EGFR  No components found for: CRCL  Evaluation of current treatment plan related to hypertension self management and patient's adherence to plan as established by provider;   Provided education to patient re:  stroke prevention, s/s of heart attack and stroke; Reviewed prescribed diet heart healthy/ADA Reviewed medications with patient and discussed importance of compliance;  Discussed plans with patient for ongoing care management follow up and provided patient with direct contact information for care management team; Advised patient, providing education and rationale, to monitor blood pressure daily and record, calling PCP for findings outside established parameters;  Reviewed scheduled/upcoming provider appointments including:  Provided education on prescribed diet heart healthy/ADA ;  Discussed complications of poorly controlled blood pressure such as heart disease, stroke, circulatory complications, vision complications, kidney impairment, sexual dysfunction;   Pain, OA, Sjogren's syndrome:  (Status: Goal on track: YES.) Pain assessment performed Medications reviewed. 02-22-2021: Patient has been started on Plaquinel 200 mg BID. The patient takes Tylenol for OA pain and discomfort Reviewed provider established plan for pain management. 02-22-2021: The patient saw the specialist on Monday. She is going to take the Planquinel and see how that works. She has been working with PT and has strengthening exercises to do. She states that they may do more testing but are going to see how this works for now. She has been diagnosed with Sjogren's syndrome.  Also she has carpal tunnel syndrome and OA. Discussed importance of adherence to all scheduled medical appointments; Counseled on the importance of reporting any/all new or changed pain symptoms or management strategies to pain management provider; Advised patient to report to care team affect of pain on daily activities; Discussed use of relaxation techniques and/or diversional activities to assist with pain reduction (distraction, imagery, relaxation, massage, acupressure, TENS, heat, and cold application; Reviewed with patient prescribed pharmacological  and nonpharmacological pain relief strategies; Advised patient to discuss unresolved pain or changes in level or intensity of pain with provider; Screening for signs and symptoms of depression related to chronic disease state;  Assessed social determinant of health barriers;   Patient Goals/Self-Care Activities: Patient will self administer medications as prescribed as evidenced by self report/primary caregiver report  Patient will attend all scheduled provider appointments as evidenced by clinician review of documented attendance to scheduled appointments and patient/caregiver report Patient will call pharmacy for medication refills as evidenced by patient report and review of pharmacy fill history as appropriate Patient will attend church or other social activities as evidenced by patient report Patient will continue to perform ADL's independently as evidenced by patient/caregiver report Patient will continue to perform IADL's independently as evidenced by patient/caregiver report Patient will call provider office for new concerns or questions as evidenced by review of documented incoming telephone call notes and patient report Patient  will work with BSW to address care coordination needs and will continue to work with the clinical team to address health care and disease management related needs as evidenced by documented adherence to scheduled care management/care coordination appointments - call office if I gain more than 2 pounds in one day or 5 pounds in one week - keep legs up while sitting - track weight in diary - use salt in moderation - watch for swelling in feet, ankles and legs every day - weigh myself daily - begin a heart failure diary - bring diary to all appointments - develop a rescue plan - follow rescue plan if symptoms flare-up - eat more whole grains, fruits and vegetables, lean meats and healthy fats - know when to call the doctor:for changes in HF and a rescue plan  for periods of exacerbation - track symptoms and what helps feel better or worse - keep appointment with eye doctor - check blood sugar at prescribed times: before meals and at bedtime, when you have symptoms of low or high blood sugar, and before and after exercise - check feet daily for cuts, sores or redness - enter blood sugar readings and medication or insulin into daily log - take the blood sugar log to all doctor visits - trim toenails straight across - drink 6 to 8 glasses of water each day - eat fish at least once per week - fill half of plate with vegetables - keep a food diary - limit fast food meals to no more than 1 per week - manage portion size - prepare main meal at home 3 to 5 days each week - read food labels for fat, fiber, carbohydrates and portion size - reduce red meat to 2 to 3 times a week - keep feet up while sitting - wash and dry feet carefully every day - wear comfortable, cotton socks - wear comfortable, well-fitting shoes - check blood pressure 3 times per week - choose a place to take my blood pressure (home, clinic or office, retail store) - write blood pressure results in a log or diary - learn about high blood pressure - keep a blood pressure log - take blood pressure log to all doctor appointments - call doctor for signs and symptoms of high blood pressure - develop an action plan for high blood pressure - keep all doctor appointments - take medications for blood pressure exactly as prescribed - report new symptoms to your doctor - eat more whole grains, fruits and vegetables, lean meats and healthy fats - call for medicine refill 2 or 3 days before it runs out - take all medications exactly as prescribed - call doctor with any symptoms you believe are related to your medicine - call doctor when you experience any new symptoms - go to all doctor appointments as scheduled - adhere to prescribed diet: heart healthy/ADA       Plan:Telephone  follow up appointment with care management team member scheduled for:  04-05-2021 at 230 pm  [place], MSN, [place] Mobile: [phone]

## 2021-02-22 NOTE — Patient Instructions (Signed)
Visit Information  PATIENT GOALS:  Goals Addressed             This Visit's Progress    RNCM: Manage Pain-Osteoarthritis       Timeframe:  Long-Range Goal Priority:  High Start Date:    07-17-2020                         Expected End Date:    10-02-2021                  Follow Up Date 04-05-2021   - call for medicine refill 2 or 3 days before it runs out - develop a personal pain management plan - keep track of prescription refills - plan exercise or activity when pain is best controlled - prioritize tasks for the day - start a pain diary - stay active - track times pain is worst and when it is best - track what makes the pain worse and what makes it better - use ice or heat for pain relief - work slower and less intense when having pain    Why is this important?   Day-to-day life can be hard when you have joint pain.  Pain medicine is just one piece of the treatment puzzle. There are many things you can do to manage pain and stay strong.   Lifestyle changes like stopping smoking and eating foods with Vitamin D and calcium keeps your bones and muscles healthy. Your joints are better when supported by strong muscles.  You can try these action steps to help you manage your pain.     Notes: Pain in back and knees and now in hands and sometimes feet. States: "My hands feel like they are drawing" 09-25-2020: The patient is going to see a specialist for her hands on 10-04-2020. 12-07-2020: The patient has had test and has seen specialist. Is using muscle relaxer's that help. She goes back to see the specialist 01-04-2021. She states she is still having issues with her hands and now in her knees. She denies any acute distress today. 05-25-2020: The patient states that she is working with the specialist and they have started her on Plaquinel 200 mg BID. She has some answers and has several things impacting her pain level. She has carpal tunnel and h as been diagnosed with sjogren's syndrome.  She  has been working with PT and they have showed her exercises to do that at helping her with her over all muscular skeletal health. Denies any acute findings at this time. Will continue to monitor.         Patient verbalizes understanding of instructions provided today and agrees to view in [place].   Telephone follow up appointment with care management team member scheduled for: 04-05-2021 at 230 pm  Noreene Larsson RN, MSN, [place] Mobile: [phone]

## 2021-03-05 ENCOUNTER — Ambulatory Visit: Payer: Self-pay

## 2021-03-05 DIAGNOSIS — I5032 Chronic diastolic (congestive) heart failure: Secondary | ICD-10-CM

## 2021-03-05 DIAGNOSIS — E7800 Pure hypercholesterolemia, unspecified: Secondary | ICD-10-CM

## 2021-03-05 DIAGNOSIS — I10 Essential (primary) hypertension: Secondary | ICD-10-CM

## 2021-03-05 DIAGNOSIS — M159 Polyosteoarthritis, unspecified: Secondary | ICD-10-CM

## 2021-03-05 DIAGNOSIS — E1169 Type 2 diabetes mellitus with other specified complication: Secondary | ICD-10-CM

## 2021-03-05 DIAGNOSIS — E785 Hyperlipidemia, unspecified: Secondary | ICD-10-CM

## 2021-03-05 NOTE — Patient Instructions (Signed)
Visit Information  PATIENT GOALS:  Goals Addressed             This Visit's Progress    RNCM: Manage Pain-Osteoarthritis       Timeframe:  Long-Range Goal Priority:  High Start Date:    07-17-2020                         Expected End Date:    10-02-2021                  Follow Up Date 04-05-2021   - call for medicine refill 2 or 3 days before it runs out - develop a personal pain management plan - keep track of prescription refills - plan exercise or activity when pain is best controlled - prioritize tasks for the day - start a pain diary - stay active - track times pain is worst and when it is best - track what makes the pain worse and what makes it better - use ice or heat for pain relief - work slower and less intense when having pain    Why is this important?   Day-to-day life can be hard when you have joint pain.  Pain medicine is just one piece of the treatment puzzle. There are many things you can do to manage pain and stay strong.   Lifestyle changes like stopping smoking and eating foods with Vitamin D and calcium keeps your bones and muscles healthy. Your joints are better when supported by strong muscles.  You can try these action steps to help you manage your pain.     Notes: Pain in back and knees and now in hands and sometimes feet. States: "My hands feel like they are drawing" 09-25-2020: The patient is going to see a specialist for her hands on 10-04-2020. 12-07-2020: The patient has had test and has seen specialist. Is using muscle relaxer's that help. She goes back to see the specialist 01-04-2021. She states she is still having issues with her hands and now in her knees. She denies any acute distress today. 02-22-2021: The patient states that she is working with the specialist and they have started her on Plaquinel 200 mg BID. She has some answers and has several things impacting her pain level. She has carpal tunnel and h as been diagnosed with sjogren's syndrome.  She  has been working with PT and they have showed her exercises to do that at helping her with her over all muscular skeletal health. Denies any acute findings at this time. Will continue to monitor. 03-05-2021: The patient called RNCM for assistance with contact information for pharm D. Denies any acute needs with OA at this time. Will continue to monitor.         Patient verbalizes understanding of instructions provided today and agrees to view in [place].   Telephone follow up appointment with care management team member scheduled for: 04-05-2021   Noreene Larsson RN, MSN, [place] Mobile: [phone]

## 2021-03-05 NOTE — Chronic Care Management (AMB) (Signed)
Chronic Care Management   CCM RN Visit Note  03/05/2021 Name: Diana Harmon MRN: [id number] DOB: [birth year]  Subjective: Diana Harmon is a 75 y.o. year old female who is a primary care patient of [name], DO. The care management team was consulted for assistance with disease management and care coordination needs.    Engaged with patient by telephone for follow up visit in response to provider referral for case management and/or care coordination services.   Consent to Services:  The patient was given information about Chronic Care Management services, agreed to services, and gave verbal consent prior to initiation of services.  Please see initial visit note for detailed documentation.   Patient agreed to services and verbal consent obtained.   Assessment: Review of patient past medical history, allergies, medications, health status, including review of consultants reports, laboratory and other test data, was performed as part of comprehensive evaluation and provision of chronic care management services.   SDOH (Social Determinants of Health) assessments and interventions performed:    CCM Care Plan  Allergies  Allergen Reactions   Ferumoxytol Swelling    Swelling in neck   Iron    Other     Other reaction(s): Other (See Comments) She refuses blood products-Jehovah witness   Penicillins Rash, Swelling and Hives    Outpatient Encounter Medications as of 03/05/2021  Medication Sig   ACCU-CHEK GUIDE test strip Use to check blood sugar twice daily   Accu-Chek Softclix Lancets lancets Use to check blood sugar twice daily   acetaminophen (TYLENOL) 500 MG tablet Take 1,000 mg by mouth every 6 (six) hours as needed for moderate pain.    Artificial Tear Solution (GENTEAL TEARS) 0.1-0.2-0.3 % SOLN Place 1 drop into both eyes 3 (three) times daily as needed (dry/irritated eyes.).   aspirin 81 MG chewable tablet Chew 1 tablet (81 mg total) by mouth  daily.   B COMPLEX-C PO Take 10 mLs by mouth daily.   baclofen (LIORESAL) 10 MG tablet TAKE 1 TO 2 TABLETS BY MOUTH TWICE DAILY AS NEEDED FOR MUSCLE SPASM   Blood Glucose Monitoring Suppl (ACCU-CHEK GUIDE) w/Device KIT Use to check blood sugar twice daily   Cholecalciferol (VITAMIN D3) 125 MCG (5000 UT) TABS Take 5,000 Units by mouth daily.   Ferrous Sulfate (IRON) 325 (65 Fe) MG TABS TAKE 1 TABLET BY MOUTH TWICE DAILY WITH A MEAL   furosemide (LASIX) 20 MG tablet Take 1 tablet (20 mg total) by mouth daily as needed for edema.   hydrochlorothiazide (HYDRODIURIL) 25 MG tablet Take 1 tablet (25 mg total) by mouth daily.   lactobacillus acidophilus (BACID) TABS tablet Take 2 tablets by mouth 2 (two) times daily as needed (digestive health.).   levocetirizine (XYZAL) 5 MG tablet TAKE 1 TABLET BY MOUTH ONCE DAILY IN THE EVENING (Patient taking differently: Take 1/2 tablet (2.5 mg) daily in the evening as needed for allergies)   losartan (COZAAR) 50 MG tablet Take 1 tablet (50 mg total) by mouth daily.   magnesium oxide (MAG-OX) 400 MG tablet Take 400 mg by mouth daily.   SHINGRIX injection Inject 0.5 mL into muscle for shingles vaccine. Repeat dose in 2-6 months.   simvastatin (ZOCOR) 20 MG tablet TAKE 1 TABLET BY MOUTH ONCE DAILY AT  6  PM   TRULICITY 0.01 VC/9.4WH SOPN Inject 0.75 mg into the skin once a week.   [DISCONTINUED] Cromolyn Sodium (NASAL ALLERGY NA) Place 1 spray into the nose daily as needed (allergies).  No facility-administered encounter medications on file as of 03/05/2021.    Patient Active Problem List   Diagnosis Date Noted   S/P TAVR (transcatheter aortic valve replacement) 11/09/2019   Patient is [name]    Severe aortic stenosis    Thyroid disease    Chronic heart failure with preserved ejection fraction (HFpEF) ([place]) 09/30/2019   Normocytic anemia 02/17/2019   Morbid obesity ([place]) 11/05/2017   Neutropenia ([place]) 04/24/2017   Vitamin D deficiency 10/08/2016    Meniere's disease of right ear 07/04/2016   TIA (transient ischemic attack) 07/04/2016   Nonrheumatic aortic valve stenosis 07/04/2016   Seasonal allergies 01/30/2016   Recurrent UTI 10/26/2015   Iron deficiency anemia due to chronic blood loss 02/28/2015   History of adenomatous polyp of colon 02/13/2015   Osteoarthritis of both knees 12/05/2014   Acid reflux 12/05/2014   Benign neoplasm of colon 12/05/2014   Type 2 diabetes mellitus with other specified complication ([place]) 47/42/5956   Essential hypertension 12/05/2014   Hyperlipidemia associated with type 2 diabetes mellitus ([place]) 12/05/2014   Arthritis, degenerative 12/05/2014   Anemia due to multiple mechanisms 12/05/2014   Abnormal liver enzymes 12/05/2014    Conditions to be addressed/monitored:HTN, HLD, and DMII  Care Plan : RNCM; General Plan of Care (Adult) for Chronic Disease Management and Care Coordination Needs  Updates made by Vanita Ingles, RN since 03/05/2021 12:00 AM     Problem: RNCM: Development of Plan of care for Chronic Disease Management (HTN/HLD/HF/DM/Chronic pain)   Priority: High     Long-Range Goal: RNCM: Effective Management of Plan of care for Chronic Disease Management (HTN/HLD/HF/DM/Chronic pain)   Start Date: 02/22/2021  Expected End Date: 02/22/2022  Priority: High  Note:   Current Barriers:  Knowledge Deficits related to plan of care for management of CHF, HTN, HLD, DMII, and Chronic pain   Chronic Disease Management support and education needs related to CHF, HTN, HLD, DMII, and Chronic Pain  RNCM Clinical Goal(s):  Patient will verbalize understanding of plan for management of CHF, HTN, HLD, DMII, and Chronic Pain   verbalize basic understanding of CHF, HTN, HLD, DMII, Osteoarthritis, and Chronic Pain disease process and self health management plan   take all medications exactly as prescribed and will call provider for medication related questions demonstrate understanding of rationale  for each prescribed medication   attend all scheduled medical appointments: 04-25-2021 at 0840 am demonstrate improved and ongoing adherence to prescribed treatment plan for CHF, HTN, HLD, DMII, Osteoarthritis, and Chronic Pain as evidenced by daily monitoring and recording of CBG  adherence to ADA/ carb modified diet adherence to prescribed medication regimen contacting provider for new or worsened symptoms or questions   demonstrate improved and ongoing health management independence   demonstrate a decrease in CHF, HTN, HLD, DMII, Osteoarthritis, and chronic pain exacerbations   demonstrate ongoing self health care management ability of effective management of chronic conditions  through collaboration with RN Care manager, provider, and care team.   Interventions: 1:1 collaboration with primary care provider regarding development and update of comprehensive plan of care as evidenced by provider attestation and co-signature Inter-disciplinary care team collaboration (see longitudinal plan of care) Evaluation of current treatment plan related to  self management and patient's adherence to plan as established by provider   SDOH Barriers (Status: Goal on track: YES.)  Patient interviewed and SDOH assessment performed        SDOH Interventions    Flowsheet Row Most Recent Value  SDOH  Interventions   Food Insecurity Interventions Intervention Not Indicated  Financial Strain Interventions Intervention Not Indicated  Housing Interventions Intervention Not Indicated  Intimate Partner Violence Interventions Intervention Not Indicated  Physical Activity Interventions Other (Comments)  [The patient is doing exercises the PT has showed her]  Stress Interventions Intervention Not Indicated  Social Connections Interventions Other (Comment)  [good support system]  Transportation Interventions Intervention Not Indicated     Patient interviewed and appropriate assessments performed Provided patient  with information about resources available for changes in SDOH and care guides available for any needs or resources in [facility] Discussed plans with patient for ongoing care management follow up and provided patient with direct contact information for care management team Advised patient to call the office for changes in [place], questions, or concerns    Heart Failure Interventions:  (Status: Goal on track: YES.) Basic overview and discussion of pathophysiology of Heart Failure reviewed; Provided education on low sodium diet; Reviewed Heart Failure Action Plan in depth and provided written copy; Assessed need for readable accurate scales in home; Provided education about placing scale on hard, flat surface; Advised patient to weigh each morning after emptying bladder; Discussed importance of daily weight and advised patient to weigh and record daily; Reviewed role of diuretics in prevention of fluid overload and management of heart failure; Discussed the importance of keeping all appointments with provider; Provided patient with education about the role of exercise in the management of heart failure;  Diabetes:  (Status: Goal on track: YES.) Lab Results  Component Value Date   HGBA1C 7.4 10/04/2020  Assessed patient's understanding of A1c goal: <7% Provided education to patient about basic DM disease process; Reviewed medications with patient and discussed importance of medication adherence. 03-05-2021: Incoming call from the patient asking if she could get the contact information for the pharm D. She is not out of her Trulicity but will need assistance with the refill. Number provided. Will also send an inbasket message to pharm D for follow up ;        Reviewed prescribed diet with patient heart healthy/ADA diet ; Counseled on importance of regular laboratory monitoring as prescribed;        Discussed plans with patient for ongoing care management follow up and provided patient with  direct contact information for care management team;      Provided patient with written educational materials related to hypo and hyperglycemia and importance of correct treatment. 02-22-2021: The patient states that she has not had any lows;       Reviewed scheduled/upcoming provider appointments including: 04-25-2021 at 0840 am;         Advised patient, providing education and rationale, to check cbg as directed and record. 02-22-2021: The patient states that her glucose readings have been good. 120-162 with the highest in the last two weeks of 147.         call provider for findings outside established parameters;       Referral made to pharmacy team for assistance with ongoing support and education with medication management;       Review of patient status, including review of consultants reports, relevant laboratory and other test results, and medications completed;       Screening for signs and symptoms of depression related to chronic disease state;        Assessed social determinant of health barriers;         Hyperlipidemia:  (Status: Goal on track: YES.) Lab Results  Component  Value Date   CHOL 170 05/03/2020   HDL 54 05/03/2020   LDLCALC 97 05/03/2020   TRIG 103 05/03/2020   CHOLHDL 3.1 05/03/2020     Medication review performed; medication list updated in electronic medical record.  Provider established cholesterol goals reviewed; Counseled on importance of regular laboratory monitoring as prescribed. 03-05-2021: Scheduled for labs in December 2022; Provided HLD educational materials; Reviewed role and benefits of statin for ASCVD risk reduction; Discussed strategies to manage statin-induced myalgias; Reviewed importance of limiting foods high in cholesterol;  Hypertension: (Status: Goal on track: YES.) Last practice recorded BP readings:  BP Readings from Last 3 Encounters:  01/11/21 136/69  11/23/20 (!) 139/57  11/09/20 120/68  Most recent eGFR/CrCl: No results found  for: EGFR  No components found for: CRCL  Evaluation of current treatment plan related to hypertension self management and patient's adherence to plan as established by provider. 03-05-2021: The patient is compliant with plan of care for HTN and maintenance of Heart health;   Provided education to patient re: stroke prevention, s/s of heart attack and stroke; Reviewed prescribed diet heart healthy/ADA Reviewed medications with patient and discussed importance of compliance;  Discussed plans with patient for ongoing care management follow up and provided patient with direct contact information for care management team; Advised patient, providing education and rationale, to monitor blood pressure daily and record, calling PCP for findings outside established parameters;  Reviewed scheduled/upcoming provider appointments including:  Provided education on prescribed diet heart healthy/ADA ;  Discussed complications of poorly controlled blood pressure such as heart disease, stroke, circulatory complications, vision complications, kidney impairment, sexual dysfunction;   Pain, OA, Sjogren's syndrome:  (Status: Goal on track: YES.) Pain assessment performed Medications reviewed. 02-22-2021: Patient has been started on Plaquinel 200 mg BID. The patient takes Tylenol for OA pain and discomfort Reviewed provider established plan for pain management. 02-22-2021: The patient saw the specialist on Monday. She is going to take the Planquinel and see how that works. She has been working with PT and has strengthening exercises to do. She states that they may do more testing but are going to see how this works for now. She has been diagnosed with Sjogren's syndrome.  Also she has carpal tunnel syndrome and OA. Discussed importance of adherence to all scheduled medical appointments; Counseled on the importance of reporting any/all new or changed pain symptoms or management strategies to pain management  provider; Advised patient to report to care team affect of pain on daily activities; Discussed use of relaxation techniques and/or diversional activities to assist with pain reduction (distraction, imagery, relaxation, massage, acupressure, TENS, heat, and cold application; Reviewed with patient prescribed pharmacological and nonpharmacological pain relief strategies; Advised patient to discuss unresolved pain or changes in level or intensity of pain with provider; Screening for signs and symptoms of depression related to chronic disease state;  Assessed social determinant of health barriers;   Patient Goals/Self-Care Activities: Patient will self administer medications as prescribed as evidenced by self report/primary caregiver report  Patient will attend all scheduled provider appointments as evidenced by clinician review of documented attendance to scheduled appointments and patient/caregiver report Patient will call pharmacy for medication refills as evidenced by patient report and review of pharmacy fill history as appropriate Patient will attend church or other social activities as evidenced by patient report Patient will continue to perform ADL's independently as evidenced by patient/caregiver report Patient will continue to perform IADL's independently as evidenced by patient/caregiver report Patient will call provider  office for new concerns or questions as evidenced by review of documented incoming telephone call notes and patient report Patient will work with BSW to address care coordination needs and will continue to work with the clinical team to address health care and disease management related needs as evidenced by documented adherence to scheduled care management/care coordination appointments - call office if I gain more than 2 pounds in one day or 5 pounds in one week - keep legs up while sitting - track weight in diary - use salt in moderation - watch for swelling in feet,  ankles and legs every day - weigh myself daily - begin a heart failure diary - bring diary to all appointments - develop a rescue plan - follow rescue plan if symptoms flare-up - eat more whole grains, fruits and vegetables, lean meats and healthy fats - know when to call the doctor:for changes in HF and a rescue plan for periods of exacerbation - track symptoms and what helps feel better or worse - keep appointment with eye doctor - check blood sugar at prescribed times: before meals and at bedtime, when you have symptoms of low or high blood sugar, and before and after exercise - check feet daily for cuts, sores or redness - enter blood sugar readings and medication or insulin into daily log - take the blood sugar log to all doctor visits - trim toenails straight across - drink 6 to 8 glasses of water each day - eat fish at least once per week - fill half of plate with vegetables - keep a food diary - limit fast food meals to no more than 1 per week - manage portion size - prepare main meal at home 3 to 5 days each week - read food labels for fat, fiber, carbohydrates and portion size - reduce red meat to 2 to 3 times a week - keep feet up while sitting - wash and dry feet carefully every day - wear comfortable, cotton socks - wear comfortable, well-fitting shoes - check blood pressure 3 times per week - choose a place to take my blood pressure (home, clinic or office, retail store) - write blood pressure results in a log or diary - learn about high blood pressure - keep a blood pressure log - take blood pressure log to all doctor appointments - call doctor for signs and symptoms of high blood pressure - develop an action plan for high blood pressure - keep all doctor appointments - take medications for blood pressure exactly as prescribed - report new symptoms to your doctor - eat more whole grains, fruits and vegetables, lean meats and healthy fats - call for medicine  refill 2 or 3 days before it runs out - take all medications exactly as prescribed - call doctor with any symptoms you believe are related to your medicine - call doctor when you experience any new symptoms - go to all doctor appointments as scheduled - adhere to prescribed diet: heart healthy/ADA       Plan:Telephone follow up appointment with care management team member scheduled for:  04-05-2021  Noreene Larsson RN, MSN, [place] Mobile: [phone]

## 2021-03-07 ENCOUNTER — Ambulatory Visit (INDEPENDENT_AMBULATORY_CARE_PROVIDER_SITE_OTHER): Payer: Medicare Other | Admitting: Pharmacist

## 2021-03-07 DIAGNOSIS — E1169 Type 2 diabetes mellitus with other specified complication: Secondary | ICD-10-CM

## 2021-03-07 NOTE — Chronic Care Management (AMB) (Signed)
Chronic Care Management Pharmacy Note  03/07/2021 Name:  Diana Harmon MRN:  [id number] DOB:  [birth year]   Subjective: Diana Harmon is an 75 y.o. year old female who is a primary patient of [name], DO.  The CCM team was consulted for assistance with disease management and care coordination needs.    Engaged with patient by telephone for follow up visit in response to provider referral for pharmacy case management and/or care coordination services.   Consent to Services:  The patient was given information about Chronic Care Management services, agreed to services, and gave verbal consent prior to initiation of services.  Please see initial visit note for detailed documentation.   Patient Care Team: [name], DO as PCP - General (Family Medicine) End, Harrell Gave, MD as PCP - Cardiology (Cardiology) Cammie Sickle, MD as Consulting Physician (Internal Medicine) Lorelee Cover., MD (Ophthalmology) End, Harrell Gave, MD as Consulting Physician (Cardiology) Vanita Ingles, RN as Case Manager ([place]) Cammie Sickle, MD as Consulting Physician (Internal Medicine) Curley Spice Virl [name], RPH-CPP as Pharmacist   Objective:  Lab Results  Component Value Date   CREATININE 1.04 (H) 01/11/2021   CREATININE 1.28 (H) 07/13/2020   CREATININE 1.36 (H) 05/03/2020    Lab Results  Component Value Date   HGBA1C 7.4 10/04/2020   Last diabetic Eye exam:  Lab Results  Component Value Date/Time   HMDIABEYEEXA No Retinopathy 10/19/2020 12:00 AM    Last diabetic Foot exam: No results found for: HMDIABFOOTEX      Component Value Date/Time   CHOL 170 05/03/2020 0935   TRIG 103 05/03/2020 0935   HDL 54 05/03/2020 0935   CHOLHDL 3.1 05/03/2020 0935   CHOLHDL 2.4 07/21/2017 0000   VLDL 26 06/03/2016 0001   LDLCALC 97 05/03/2020 0935   LDLCALC 77 07/21/2017 0000    Hepatic Function Latest Ref Rng & Units  05/03/2020 11/12/2019 11/10/2019  Total Protein 6.0 - 8.5 g/dL 7.3 6.3(L) 7.2  Albumin 3.7 - 4.7 g/dL 3.9 2.5(L) 3.1(L)  AST 0 - 40 IU/L 41(H) 34 45(H)  ALT 0 - 32 IU/L 26 29 33  Alk Phosphatase 44 - 121 IU/L 72 43 54  Total Bilirubin 0.0 - 1.2 mg/dL 0.2 0.6 0.5  Bilirubin, Direct 0.00 - 0.40 mg/dL - - -     Social History   Tobacco Use  Smoking Status Former   Years: 20.00   Types: Cigarettes   Quit date: 09/05/1979   Years since quitting: 41.5  Smokeless Tobacco Former   BP Readings from Last 3 Encounters:  01/11/21 136/69  11/23/20 (!) 139/57  11/09/20 120/68   Pulse Readings from Last 3 Encounters:  01/11/21 80  11/23/20 85  11/09/20 78   Wt Readings from Last 3 Encounters:  01/11/21 232 lb 9.6 oz (105.5 kg)  11/23/20 230 lb 9.6 oz (104.6 kg)  11/09/20 230 lb 12.8 oz (104.7 kg)    Assessment: Review of patient past medical history, allergies, medications, health status, including review of consultants reports, laboratory and other test data, was performed as part of comprehensive evaluation and provision of chronic care management services.   SDOH:  (Social Determinants of Health) assessments and interventions performed:    CCM Care Plan  Allergies  Allergen Reactions   Ferumoxytol Swelling    Swelling in neck   Iron    Other     Other reaction(s): Other (See Comments) She refuses blood products-Jehovah witness   Penicillins Rash,  Swelling and Hives    Medications Reviewed Today     Reviewed by Rennis Petty, RPH-CPP (Pharmacist) on 03/07/21 at 1431  Med List Status: <None>   Medication Order Taking? Sig Documenting Provider Last Dose Status Informant  ACCU-CHEK GUIDE test strip [id number]  Use to check blood sugar twice daily [name], DO  Active   Accu-Chek Softclix Lancets lancets [id number]  Use to check blood sugar twice daily [name], DO  Active   acetaminophen (TYLENOL) 500 MG tablet [id number]  Take 1,000 mg by  mouth every 6 (six) hours as needed for moderate pain.  [provider]  Active Child  Artificial Tear Solution (GENTEAL TEARS) 0.1-0.2-0.3 % SOLN [id number]  Place 1 drop into both eyes 3 (three) times daily as needed (dry/irritated eyes.). [provider]  Active Child  aspirin 81 MG chewable tablet [id number]  Chew 1 tablet (81 mg total) by mouth daily. [name]  Active Child  B COMPLEX-C PO [id number]  Take 10 mLs by mouth daily. [provider]  Active Child  baclofen (LIORESAL) 10 MG tablet [id number]  TAKE 1 TO 2 TABLETS BY MOUTH TWICE DAILY AS NEEDED FOR MUSCLE SPASM Parks Ranger, Devonne Doughty, DO  Active Child  Blood Glucose Monitoring Suppl (ACCU-CHEK GUIDE) w/Device KIT [id number]  Use to check blood sugar twice daily [name], DO  Active   Cholecalciferol (VITAMIN D3) 125 MCG (5000 UT) TABS [id number]  Take 5,000 Units by mouth daily. [provider]  Active Child    Discontinued 09/05/19 1316   Ferrous Sulfate (IRON) 325 (65 Fe) MG TABS [id number]  TAKE 1 TABLET BY MOUTH TWICE DAILY WITH A MEAL [name], Devonne Doughty, DO  Active Child  furosemide (LASIX) 20 MG tablet [id number]  Take 1 tablet (20 mg total) by mouth daily as needed for edema. End, Harrell Gave, MD  Active Child  hydrochlorothiazide (HYDRODIURIL) 25 MG tablet [id number]  Take 1 tablet (25 mg total) by mouth daily. End, Harrell Gave, MD  Active   hydroxychloroquine (PLAQUENIL) 200 MG tablet [id number] Yes Take 200 mg by mouth 2 (two) times daily. [provider] Taking Active   lactobacillus acidophilus (BACID) TABS tablet [id number]  Take 2 tablets by mouth 2 (two) times daily as needed (digestive health.). [provider]  Active            Med Note Nat Christen   Wed Nov 03, 2019  3:06 PM)    levocetirizine (XYZAL) 5 MG tablet [id number]  TAKE 1 TABLET BY MOUTH ONCE DAILY IN THE EVENING  Patient taking differently: Take 1/2 tablet (2.5 mg)  daily in the evening as needed for allergies   [name], DO  Active   losartan (COZAAR) 50 MG tablet [id number]  Take 1 tablet (50 mg total) by mouth daily. End, Harrell Gave, MD  Active   magnesium oxide (MAG-OX) 400 MG tablet [id number]  Take 400 mg by mouth daily. [provider]  Active   [facility] injection [id number]  Inject 0.5 mL into muscle for shingles vaccine. Repeat dose in 2-6 months. [name], DO  Active   simvastatin (ZOCOR) 20 MG tablet [id number]  TAKE 1 TABLET BY MOUTH ONCE DAILY AT  6  PM [name], Devonne Doughty, DO  Active   TRULICITY 3.55 HR/4.1UL SOPN [id number]  Inject 0.75 mg into the skin once a week. [name], DO  Active             Patient  Active Problem List   Diagnosis Date Noted   S/P TAVR (transcatheter aortic valve replacement) 11/09/2019   Patient is [name]    Severe aortic stenosis    Thyroid disease    Chronic heart failure with preserved ejection fraction (HFpEF) ([place]) 09/30/2019   Normocytic anemia 02/17/2019   Morbid obesity ([place]) 11/05/2017   Neutropenia ([place]) 04/24/2017   Vitamin D deficiency 10/08/2016   Meniere's disease of right ear 07/04/2016   TIA (transient ischemic attack) 07/04/2016   Nonrheumatic aortic valve stenosis 07/04/2016   Seasonal allergies 01/30/2016   Recurrent UTI 10/26/2015   Iron deficiency anemia due to chronic blood loss 02/28/2015   History of adenomatous polyp of colon 02/13/2015   Osteoarthritis of both knees 12/05/2014   Acid reflux 12/05/2014   Benign neoplasm of colon 12/05/2014   Type 2 diabetes mellitus with other specified complication ([place]) 45/36/4680   Essential hypertension 12/05/2014   Hyperlipidemia associated with type 2 diabetes mellitus ([place]) 12/05/2014   Arthritis, degenerative 12/05/2014   Anemia due to multiple mechanisms 12/05/2014   Abnormal liver enzymes 12/05/2014    Immunization History  Administered Date(s) Administered    Fluad Quad(high Dose 65+) 01/12/2019, 02/11/2020   Influenza, High Dose Seasonal PF 02/09/2014, 01/05/2015, 01/30/2016, 01/14/2017, 02/17/2021   Influenza,inj,Quad PF,6+ Mos 04/05/2013   Influenza-Unspecified 02/03/2014, 02/03/2018   Moderna Sars-Covid-2 Vaccination 09/25/2019, 10/23/2019, 04/23/2020   Pneumococcal Conjugate-13 08/05/2014   Pneumococcal Polysaccharide-23 04/05/2013    Conditions to be addressed/monitored: T2DM, HLD, HTN, HFpEF  Care Plan : PharmD - Medication Assistance/Management  Updates made by Rennis Petty, RPH-CPP since 03/07/2021 12:00 AM     Problem: Disease Progression      Long-Range Goal: Disease Progression Prevented or Minimized   Start Date: 10/30/2020  Expected End Date: 01/28/2021  This Visit's Progress: On track  Recent Progress: On track  Priority: High  Note:   Current Barriers:  Unable to independently afford treatment regimen Patient APPROVED for [facility] Patient Assistance Program for Trulicity, eligible from 11/21/20-05/05/21  Pharmacist Clinical Goal(s):  Over the next 90 days, patient will verbalize ability to afford treatment regimen through collaboration with PharmD and provider.   Interventions: 1:1 collaboration with [name], DO regarding development and update of comprehensive plan of care as evidenced by provider attestation and co-signature Inter-disciplinary care team collaboration (see longitudinal plan of care) Receive a message from CCM Nurse Case Manager requesting call to patient regarding questions about patient assistance program Perform chart review. Office Visit with [facility] on 9/8 Office Visit with [facility] Rheumatology on 10/17. Provider advised patient: Start Plaquenil 200 mg Twice Daily Referral placed to Neurosurgery for further evaluation of abnormal MRI Return for follow up in 3 months Reports started taking Plaquenil Rx as directed by Rheumatologist Reports has  an eye exam schedule in the next month  T2DM: Current treatment: Trulicity 3.21 mg weekly on Saturdays Previous therapies: metformin Have discussed dietary changes including having regular well balanced meals and limiting carbohydrate portion sizes Cholesterol management: simvastatin 20 mg daily Exercise Reported exercise limited by arthritis.  Started yoga with her daughter Reported misses going to the pool, but stopped going due to COVID-19 pandemic Reported uses upright exercise bike at home some depending on her arthritis   HTN: Current treatment: HCTZ 25 mg daily Losartan 50 mg daily Furosemide 20 mg daily as needed for edema Patient currently using wrist monitor.  Have encouraged patient to consider obtaining upper arm blood pressure monitor for greater accuracy  Counseled on using Part D health plan OTC benefit for obtaining monitor Counseled on importance of obtaining correct cuff size. Patient confirms will order a size large cuff Encouraged patient to monitor daily weights and follow direction from Cardiology for taking furosemide as needed  Encouraged patient to bring record of daily weights and home BP readings with her to medical appointments   Medication Assistance: Counsel patient to contact [employer] patient assistance program regarding refill of Trulicity Note Trulicity should be on automatic refill with program Patient interested in re-enrolling in [place] patient assistance program Will ask [place] to aid patient in reapply to [employer] patient assistance program for 2023 calendar year   Patient Goals/Self-Care Activities Over the next 90 days, patient will:  - take medications as prescribed - check glucose, document, and provide at future appointments - check blood pressure, document, and provide at future appointments - collaborate with provider on medication access solutions - attend medical appointments as scheduled  Follow Up Plan: Telephone follow  up appointment with care management team member scheduled for: 04/11/2021 at 10:00 AM       Patient's preferred pharmacy is:  [facility] [address], [place] [address] Phone: [phone] Fax: [phone]  [email or web address] - [place], [place] - [phone] [facility] [address] [facility] [address] [place] [address] Phone: [phone] Fax: [phone]  Follow Up:  Patient agrees to Care Plan and Follow-up.  Wallace Cullens, PharmD, Para [place], CPP Clinical Pharmacist [facility] [phone]

## 2021-03-07 NOTE — Patient Instructions (Signed)
Visit Information  Our goal A1c is less than 7%. This corresponds with fasting sugars less than 130 and 2 hour after meal sugars less than 180. Please keep a log of your results when checking your blood sugar  Our goal bad cholesterol, or LDL, is less than 70 . This is why it is important to continue taking your simvastatin  Please check your home blood pressure, keep a log of the results and bring this with you to your medical appointments.  Feel free to call me with any questions or concerns. I look forward to our next call!   Wallace Cullens, PharmD, Para [place], CPP Clinical Pharmacist [facility] [phone]  The patient verbalized understanding of instructions, educational materials, and care plan provided today and declined offer to receive copy of patient instructions, educational materials, and care plan.   Telephone follow up appointment with care management team member scheduled for: 04/11/2021 at 10:00 AM

## 2021-03-12 DIAGNOSIS — I10 Essential (primary) hypertension: Secondary | ICD-10-CM | POA: Diagnosis not present

## 2021-03-12 DIAGNOSIS — E119 Type 2 diabetes mellitus without complications: Secondary | ICD-10-CM | POA: Diagnosis not present

## 2021-03-12 DIAGNOSIS — N1831 Chronic kidney disease, stage 3a: Secondary | ICD-10-CM | POA: Diagnosis not present

## 2021-03-12 DIAGNOSIS — D649 Anemia, unspecified: Secondary | ICD-10-CM | POA: Diagnosis not present

## 2021-03-14 ENCOUNTER — Telehealth: Payer: Medicare Other

## 2021-03-14 ENCOUNTER — Telehealth: Payer: Self-pay | Admitting: Pharmacy Technician

## 2021-03-14 DIAGNOSIS — Z596 Low income: Secondary | ICD-10-CM

## 2021-03-14 NOTE — Progress Notes (Signed)
[facility])                                            [place] Team    03/14/2021  Diana Harmon [birth year] [id number]  FOR 2023 RE ENROLLMENT                                      Medication Assistance Referral  Referral From: [facility] Embedded RPh Dorthula Perfect   Medication/Company: Danelle Berry / [name] Patient application portion:  Mailed Provider application portion: Faxed  to Dr. Parks Ranger Provider address/fax verified via: Office website    Lai Hendriks P. Keyleen Cerrato, [place]  [phone]

## 2021-03-15 DIAGNOSIS — M545 Low back pain, unspecified: Secondary | ICD-10-CM | POA: Diagnosis not present

## 2021-03-15 DIAGNOSIS — G8929 Other chronic pain: Secondary | ICD-10-CM | POA: Diagnosis not present

## 2021-03-15 DIAGNOSIS — M5136 Other intervertebral disc degeneration, lumbar region: Secondary | ICD-10-CM | POA: Diagnosis not present

## 2021-03-15 DIAGNOSIS — M5124 Other intervertebral disc displacement, thoracic region: Secondary | ICD-10-CM | POA: Diagnosis not present

## 2021-03-15 DIAGNOSIS — M542 Cervicalgia: Secondary | ICD-10-CM | POA: Diagnosis not present

## 2021-03-16 ENCOUNTER — Ambulatory Visit: Payer: Self-pay

## 2021-03-16 NOTE — Telephone Encounter (Signed)
Patient called and says she's having vaginal itching and discharge x 2 days. She says it started after taking Plaquenil. She says the vaginal itching on the outside was relieved with the topical monistat cream, so she knew it was a yeast infection starting. Denies other symptoms. Advised PCP is not in the office today and she will need a provider to order the medication. Advised E-Visit on MyChart, she says her daughter will do it for her. Care advice given, advised to call back if appointment is needed for next week or if symptoms worsen, patient verbalized understanding.   Summary: Possible Yeast Infection   Patient states since she has started taking hydroxychloroquine (PLAQUENIL) 200 MG tablet for the last 2 days she has been experiencing vaginal itchiness, and vaginal order. Patient states topical monistat has worked for the irration but would like antibiotic sent to [place], [place].      Reason for Disposition  MODERATE-SEVERE itching (i.e., interferes with school, work, or sleep)  Answer Assessment - Initial Assessment Questions 1. SYMPTOM: "What's the main symptom you're concerned about?" (e.g., pain, itching, dryness)     Vaginal itching and discharge 2. LOCATION: "Where is the itching located?" (e.g., inside/outside, left/right)     Outside mostly 3. ONSET: "When did the symptoms start?"     A couple of days ago 4. PAIN: "Is there any pain?" If Yes, ask: "How bad is it?" (Scale: 1-10; mild, moderate, severe)     No 5. ITCHING: "Is there any itching?" If Yes, ask: "How bad is it?" (Scale: 1-10; mild, moderate, severe)     Medium 6. CAUSE: "What do you think is causing the discharge?" "Have you had the same problem before? What happened then?"     Possible yeast infection 7. OTHER SYMPTOMS: "Do you have any other symptoms?" (e.g., fever, itching, vaginal bleeding, pain with urination, injury to genital area, vaginal foreign body)     No 8.  PREGNANCY: "Is there any chance you are pregnant?" "When was your last menstrual period?"     No  Protocols used: Vaginal Symptoms-A-AH

## 2021-03-18 NOTE — Telephone Encounter (Signed)
noted 

## 2021-03-27 ENCOUNTER — Telehealth: Payer: Self-pay | Admitting: Pharmacy Technician

## 2021-03-27 DIAGNOSIS — Z596 Low income: Secondary | ICD-10-CM

## 2021-03-27 NOTE — Progress Notes (Signed)
[facility])                                            [place] Team    03/27/2021  Diana Harmon July 12, 1945 [id number]  Received both patient and provider portion(s) of patient assistance application(s) for Trulicity. Faxed completed application and required documents into [employer].    Sallyann Kinnaird P. Areli Jowett, [place]  [phone]

## 2021-04-04 DIAGNOSIS — E1169 Type 2 diabetes mellitus with other specified complication: Secondary | ICD-10-CM | POA: Diagnosis not present

## 2021-04-05 ENCOUNTER — Telehealth: Payer: Self-pay

## 2021-04-05 ENCOUNTER — Ambulatory Visit (INDEPENDENT_AMBULATORY_CARE_PROVIDER_SITE_OTHER): Payer: Medicare Other

## 2021-04-05 ENCOUNTER — Telehealth: Payer: Medicare Other

## 2021-04-05 DIAGNOSIS — M3505 Sjogren syndrome with inflammatory arthritis: Secondary | ICD-10-CM

## 2021-04-05 DIAGNOSIS — L989 Disorder of the skin and subcutaneous tissue, unspecified: Secondary | ICD-10-CM

## 2021-04-05 DIAGNOSIS — M159 Polyosteoarthritis, unspecified: Secondary | ICD-10-CM

## 2021-04-05 DIAGNOSIS — I10 Essential (primary) hypertension: Secondary | ICD-10-CM

## 2021-04-05 DIAGNOSIS — E785 Hyperlipidemia, unspecified: Secondary | ICD-10-CM

## 2021-04-05 DIAGNOSIS — E1169 Type 2 diabetes mellitus with other specified complication: Secondary | ICD-10-CM

## 2021-04-05 DIAGNOSIS — M79641 Pain in right hand: Secondary | ICD-10-CM

## 2021-04-05 DIAGNOSIS — G5602 Carpal tunnel syndrome, left upper limb: Secondary | ICD-10-CM

## 2021-04-05 DIAGNOSIS — I5032 Chronic diastolic (congestive) heart failure: Secondary | ICD-10-CM

## 2021-04-05 DIAGNOSIS — E7800 Pure hypercholesterolemia, unspecified: Secondary | ICD-10-CM

## 2021-04-05 NOTE — Telephone Encounter (Signed)
  Care Management   Follow Up Note   04/05/2021 Name: Diana Harmon MRN: [id number] DOB: 1946-02-07   Referred by: [name], DO Reason for referral : Chronic Care Management (RNCM: Follow up for Chronic Disease Management and Care Coordination Needs )   Reached back out to the patient and was able to complete the call. See new encounter for further information.   Follow Up Plan: Telephone follow up appointment with care management team member scheduled for:06-07-2021 at 87 pm  [place], MSN, [place] Mobile: [phone]

## 2021-04-05 NOTE — Chronic Care Management (AMB) (Signed)
Chronic Care Management   CCM RN Visit Note  04/05/2021 Name: Diana Harmon MRN: [id number] DOB: Sep 28, 1945  Subjective: Diana Harmon is a 75 y.o. year old female who is a primary care patient of [name], DO. The care management team was consulted for assistance with disease management and care coordination needs.    Engaged with patient by telephone for follow up visit in response to provider referral for case management and/or care coordination services.   Consent to Services:  The patient was given information about Chronic Care Management services, agreed to services, and gave verbal consent prior to initiation of services.  Please see initial visit note for detailed documentation.   Patient agreed to services and verbal consent obtained.   Assessment: Review of patient past medical history, allergies, medications, health status, including review of consultants reports, laboratory and other test data, was performed as part of comprehensive evaluation and provision of chronic care management services.   SDOH (Social Determinants of Health) assessments and interventions performed:    CCM Care Plan  Allergies  Allergen Reactions   Ferumoxytol Swelling    Swelling in neck   Iron    Other     Other reaction(s): Other (See Comments) She refuses blood products-Jehovah witness   Penicillins Rash, Swelling and Hives    Outpatient Encounter Medications as of 04/05/2021  Medication Sig   ACCU-CHEK GUIDE test strip Use to check blood sugar twice daily   Accu-Chek Softclix Lancets lancets Use to check blood sugar twice daily   acetaminophen (TYLENOL) 500 MG tablet Take 1,000 mg by mouth every 6 (six) hours as needed for moderate pain.    Artificial Tear Solution (GENTEAL TEARS) 0.1-0.2-0.3 % SOLN Place 1 drop into both eyes 3 (three) times daily as needed (dry/irritated eyes.).   aspirin 81 MG chewable tablet Chew 1 tablet (81 mg total) by mouth  daily.   B COMPLEX-C PO Take 10 mLs by mouth daily.   baclofen (LIORESAL) 10 MG tablet TAKE 1 TO 2 TABLETS BY MOUTH TWICE DAILY AS NEEDED FOR MUSCLE SPASM   Blood Glucose Monitoring Suppl (ACCU-CHEK GUIDE) w/Device KIT Use to check blood sugar twice daily   Cholecalciferol (VITAMIN D3) 125 MCG (5000 UT) TABS Take 5,000 Units by mouth daily.   Ferrous Sulfate (IRON) 325 (65 Fe) MG TABS TAKE 1 TABLET BY MOUTH TWICE DAILY WITH A MEAL   furosemide (LASIX) 20 MG tablet Take 1 tablet (20 mg total) by mouth daily as needed for edema.   hydrochlorothiazide (HYDRODIURIL) 25 MG tablet Take 1 tablet (25 mg total) by mouth daily.   hydroxychloroquine (PLAQUENIL) 200 MG tablet Take 200 mg by mouth 2 (two) times daily.   lactobacillus acidophilus (BACID) TABS tablet Take 2 tablets by mouth 2 (two) times daily as needed (digestive health.).   levocetirizine (XYZAL) 5 MG tablet TAKE 1 TABLET BY MOUTH ONCE DAILY IN THE EVENING (Patient taking differently: Take 1/2 tablet (2.5 mg) daily in the evening as needed for allergies)   losartan (COZAAR) 50 MG tablet Take 1 tablet (50 mg total) by mouth daily.   magnesium oxide (MAG-OX) 400 MG tablet Take 400 mg by mouth daily.   SHINGRIX injection Inject 0.5 mL into muscle for shingles vaccine. Repeat dose in 2-6 months.   simvastatin (ZOCOR) 20 MG tablet TAKE 1 TABLET BY MOUTH ONCE DAILY AT  6  PM   TRULICITY 4.19 FX/9.0WI SOPN Inject 0.75 mg into the skin once a week.   [DISCONTINUED]  Cromolyn Sodium (NASAL ALLERGY NA) Place 1 spray into the nose daily as needed (allergies).   No facility-administered encounter medications on file as of 04/05/2021.    Patient Active Problem List   Diagnosis Date Noted   S/P TAVR (transcatheter aortic valve replacement) 11/09/2019   Patient is [name]    Severe aortic stenosis    Thyroid disease    Chronic heart failure with preserved ejection fraction (HFpEF) ([place]) 09/30/2019   Normocytic anemia 02/17/2019   Morbid  obesity ([place]) 11/05/2017   Neutropenia ([place]) 04/24/2017   Vitamin D deficiency 10/08/2016   Meniere's disease of right ear 07/04/2016   TIA (transient ischemic attack) 07/04/2016   Nonrheumatic aortic valve stenosis 07/04/2016   Seasonal allergies 01/30/2016   Recurrent UTI 10/26/2015   Iron deficiency anemia due to chronic blood loss 02/28/2015   History of adenomatous polyp of colon 02/13/2015   Osteoarthritis of both knees 12/05/2014   Acid reflux 12/05/2014   Benign neoplasm of colon 12/05/2014   Type 2 diabetes mellitus with other specified complication ([place]) 98/33/8250   Essential hypertension 12/05/2014   Hyperlipidemia associated with type 2 diabetes mellitus ([place]) 12/05/2014   Arthritis, degenerative 12/05/2014   Anemia due to multiple mechanisms 12/05/2014   Abnormal liver enzymes 12/05/2014    Conditions to be addressed/monitored:CHF, HTN, HLD, DMII, and Chronic pain and area on her back that is concerning to her  Care Plan : RNCM; General Plan of Care (Adult) for Chronic Disease Management and Care Coordination Needs  Updates made by Vanita Ingles, RN since 04/05/2021 12:00 AM     Problem: RNCM: Development of Plan of care for Chronic Disease Management (HTN/HLD/HF/DM/Chronic pain)   Priority: High     Long-Range Goal: RNCM: Effective Management of Plan of care for Chronic Disease Management (HTN/HLD/HF/DM/Chronic pain)   Start Date: 02/22/2021  Expected End Date: 02/22/2022  Priority: High  Note:   Current Barriers:  Knowledge Deficits related to plan of care for management of CHF, HTN, HLD, DMII, and Chronic pain   Chronic Disease Management support and education needs related to CHF, HTN, HLD, DMII, and Chronic Pain  RNCM Clinical Goal(s):  Patient will verbalize understanding of plan for management of CHF, HTN, HLD, DMII, and Chronic Pain   verbalize basic understanding of CHF, HTN, HLD, DMII, Osteoarthritis, and Chronic Pain disease process and self health  management plan  take all medications exactly as prescribed and will call provider for medication related questions demonstrate understanding of rationale for each prescribed medication  attend all scheduled medical appointments: 04-25-2021 at 0840 am demonstrate improved and ongoing adherence to prescribed treatment plan for CHF, HTN, HLD, DMII, Osteoarthritis, and Chronic Pain as evidenced by daily monitoring and recording of CBG  adherence to ADA/ carb modified diet adherence to prescribed medication regimen contacting provider for new or worsened symptoms or questions  demonstrate improved and ongoing health management independence  demonstrate a decrease in CHF, HTN, HLD, DMII, Osteoarthritis, and chronic pain exacerbations  demonstrate ongoing self health care management ability of effective management of chronic conditions through collaboration with RN Care manager, provider, and care team.   Interventions: 1:1 collaboration with primary care provider regarding development and update of comprehensive plan of care as evidenced by provider attestation and co-signature Inter-disciplinary care team collaboration (see longitudinal plan of care) Evaluation of current treatment plan related to  self management and patient's adherence to plan as established by provider   SDOH Barriers (Status: Goal on track: YES.)  Patient  interviewed and SDOH assessment performed        SDOH Interventions    Flowsheet Row Most Recent Value  SDOH Interventions   Food Insecurity Interventions Intervention Not Indicated  Financial Strain Interventions Intervention Not Indicated  Housing Interventions Intervention Not Indicated  Intimate Partner Violence Interventions Intervention Not Indicated  Physical Activity Interventions Other (Comments)  [The patient is doing exercises the PT has showed her]  Stress Interventions Intervention Not Indicated  Social Connections Interventions Other (Comment)  [good  support system]  Transportation Interventions Intervention Not Indicated     Patient interviewed and appropriate assessments performed Provided patient with information about resources available for changes in SDOH and care guides available for any needs or resources in [facility] Discussed plans with patient for ongoing care management follow up and provided patient with direct contact information for care management team Advised patient to call the office for changes in SDOH, questions, or concerns    Heart Failure Interventions:  (Status: Goal on track: YES.) Basic overview and discussion of pathophysiology of Heart Failure reviewed; Provided education on low sodium diet; Reviewed Heart Failure Action Plan in depth and provided written copy; Assessed need for readable accurate scales in home; Provided education about placing scale on hard, flat surface; Advised patient to weigh each morning after emptying bladder; Discussed importance of daily weight and advised patient to weigh and record daily; Reviewed role of diuretics in prevention of fluid overload and management of heart failure; Discussed the importance of keeping all appointments with provider. 04-05-2021: Has upcoming appointment with pcp on 04-25-2021 at 0840 am; Provided patient with education about the role of exercise in the management of heart failure;  Diabetes:  (Status: Goal on track: YES.) Lab Results  Component Value Date   HGBA1C 7.4 10/04/2020  Assessed patient's understanding of A1c goal: <7% Provided education to patient about basic DM disease process; Reviewed medications with patient and discussed importance of medication adherence. 03-05-2021: Incoming call from the patient asking if she could get the contact information for the pharm D. She is not out of her Trulicity but will need assistance with the refill. Number provided. Will also send an inbasket message to pharm D for follow up. 04-05-2021: Is  working with the pharm D for ongoing support and management of medications ;        Reviewed prescribed diet with patient heart healthy/ADA diet. 04-05-2021: Is monitoring her dietary intake and is compliant with heart healthy/ADA diet ; Counseled on importance of regular laboratory monitoring as prescribed;        Discussed plans with patient for ongoing care management follow up and provided patient with direct contact information for care management team;      Provided patient with written educational materials related to hypo and hyperglycemia and importance of correct treatment. 04-05-2021: The patient states last week she had a low of 64. States she was asymptomatic. She states she did not feel worse. Education on making sure when blood sugars are that low to eat well. Review of sx and sx of hypoglycemia and hyperglycemia;       Reviewed scheduled/upcoming provider appointments including: 04-25-2021 at 0840 am;         Advised patient, providing education and rationale, to check cbg as directed and record. 02-22-2021: The patient states that her glucose readings have been good. 120-162 with the highest in the last two weeks of 147. 04-05-2021: The patient provided several readings of her blood sugars. Range has been 64  to 146. Denies any acute findings. Has her log to take with her to the provider office at upcoming visit.         call provider for findings outside established parameters;       Referral made to pharmacy team for assistance with ongoing support and education with medication management;       Review of patient status, including review of consultants reports, relevant laboratory and other test results, and medications completed;       Screening for signs and symptoms of depression related to chronic disease state;        Assessed social determinant of health barriers;         Hyperlipidemia:  (Status: Goal on track: YES.) Lab Results  Component Value Date   CHOL 170 05/03/2020    HDL 54 05/03/2020   LDLCALC 97 05/03/2020   TRIG 103 05/03/2020   CHOLHDL 3.1 05/03/2020     Medication review performed; medication list updated in electronic medical record.  Provider established cholesterol goals reviewed; Counseled on importance of regular laboratory monitoring as prescribed. 03-05-2021: Scheduled for labs in December 2022; Provided HLD educational materials; Reviewed role and benefits of statin for ASCVD risk reduction; Discussed strategies to manage statin-induced myalgias; Reviewed importance of limiting foods high in cholesterol. 04-05-2021: Is compliant with heart healthy/ADA diet ;  Hypertension: (Status: Goal on track: YES.) Last practice recorded BP readings:  BP Readings from Last 3 Encounters:  01/11/21 136/69  11/23/20 (!) 139/57  11/09/20 120/68  Most recent eGFR/CrCl: No results found for: EGFR  No components found for: CRCL  Evaluation of current treatment plan related to hypertension self management and patient's adherence to plan as established by provider. 04-05-2021: The patient is compliant with plan of care for HTN and maintenance of Heart health;   Provided education to patient re: stroke prevention, s/s of heart attack and stroke; Reviewed prescribed diet heart healthy/ADA Reviewed medications with patient and discussed importance of compliance;  Discussed plans with patient for ongoing care management follow up and provided patient with direct contact information for care management team; Advised patient, providing education and rationale, to monitor blood pressure daily and record, calling PCP for findings outside established parameters. 04-05-2021: The patient is checking her blood pressures daily. The patient denies any issues with blood pressures. Systolic range is 035 to 009 and diastolic 38-18. Review of goal of systolic of <299 and diastolic <37. The patient denies any headaches or other issues related to possible elevation in blood  pressures;  Reviewed scheduled/upcoming provider appointments including: 04-25-2021 at 0840 am Provided education on prescribed diet heart healthy/ADA ;  Discussed complications of poorly controlled blood pressure such as heart disease, stroke, circulatory complications, vision complications, kidney impairment, sexual dysfunction;   Pain, OA, Sjogren's syndrome:  (Status: Goal on track: YES.) Pain assessment performed. 04-05-2021: The patient states her pain level today is 5 and she is tolerating her pain much better.  Medications reviewed. 02-22-2021: Patient has been started on Plaquinel 200 mg BID. The patient takes Tylenol for OA pain and discomfort. 04-05-2021: The patient is compliant with medications, has ongoing support from the pharm D.  Reviewed provider established plan for pain management. 02-22-2021: The patient saw the specialist on Monday. She is going to take the Planquinel and see how that works. She has been working with PT and has strengthening exercises to do. She states that they may do more testing but are going to see how this works for now. She  has been diagnosed with Sjogren's syndrome.  Also she has carpal tunnel syndrome and OA. 04-05-2021: The patient goes tomorrow to the specialist at 2 pm to follow up with the pain and numbness in her hands. She states she is doing much better with the exercises she has been doing and the recommendations given to her by the provider.  Discussed importance of adherence to all scheduled medical appointments; Counseled on the importance of reporting any/all new or changed pain symptoms or management strategies to pain management provider; Advised patient to report to care team affect of pain on daily activities; Discussed use of relaxation techniques and/or diversional activities to assist with pain reduction (distraction, imagery, relaxation, massage, acupressure, TENS, heat, and cold application; Reviewed with patient prescribed  pharmacological and nonpharmacological pain relief strategies; Advised patient to discuss unresolved pain or changes in level or intensity of pain with provider; Screening for signs and symptoms of depression related to chronic disease state;  Assessed social determinant of health barriers;    Questionable Abscess to back  (Status: New goal.) Short Term Goal  Evaluation of current treatment plan related to  Possible abscess to back  ,  self-management and patient's adherence to plan as established by provider. Discussed plans with patient for ongoing care management follow up and provided patient with direct contact information for care management team Advised patient to call the office for changes in the area on her back, for sx and sx of infection, and to call the office for a sooner appointment if need for evaluation on what the patient believes to be an abscess on her back. Many years ago she had something similar to this on her back in the same area and it had to be drained. ; Provided education to patient re: sx and sx of infection, monitoring for chills, elevation in body temperature, increased redness to the area, pustulant drainage from the area; Reviewed scheduled/upcoming provider appointments including 04-25-2021 at [place] am; Advised patient to discuss area to the back that is sore with a little redness. with provider;  Collaboration with pcp about the patients concern about area to her back she calls a "possible abscess". The patient had something similar in the same area several years ago and it had to have surgical intervention to be drained. The patient states this are is sore and per her daughter a little red. The patient has upcoming appointment with pcp. Advised the patient to monitor for sx and sx of infection and to call for a sooner appointment if needed.   Patient Goals/Self-Care Activities: Patient will self administer medications as prescribed as evidenced by self  report/primary caregiver report  Patient will attend all scheduled provider appointments as evidenced by clinician review of documented attendance to scheduled appointments and patient/caregiver report Patient will call pharmacy for medication refills as evidenced by patient report and review of pharmacy fill history as appropriate Patient will attend church or other social activities as evidenced by patient report Patient will continue to perform ADL's independently as evidenced by patient/caregiver report Patient will continue to perform IADL's independently as evidenced by patient/caregiver report Patient will call provider office for new concerns or questions as evidenced by review of documented incoming telephone call notes and patient report Patient will work with BSW to address care coordination needs and will continue to work with the clinical team to address health care and disease management related needs as evidenced by documented adherence to scheduled care management/care coordination appointments - call office if I  gain more than 2 pounds in one day or 5 pounds in one week - keep legs up while sitting - track weight in diary - use salt in moderation - watch for swelling in feet, ankles and legs every day - weigh myself daily - begin a heart failure diary - bring diary to all appointments - develop a rescue plan - follow rescue plan if symptoms flare-up - eat more whole grains, fruits and vegetables, lean meats and healthy fats - know when to call the doctor:for changes in HF and a rescue plan for periods of exacerbation - track symptoms and what helps feel better or worse - keep appointment with eye doctor - check blood sugar at prescribed times: before meals and at bedtime, when you have symptoms of low or high blood sugar, and before and after exercise - check feet daily for cuts, sores or redness - enter blood sugar readings and medication or insulin into daily log - take  the blood sugar log to all doctor visits - trim toenails straight across - drink 6 to 8 glasses of water each day - eat fish at least once per week - fill half of plate with vegetables - keep a food diary - limit fast food meals to no more than 1 per week - manage portion size - prepare main meal at home 3 to 5 days each week - read food labels for fat, fiber, carbohydrates and portion size - reduce red meat to 2 to 3 times a week - keep feet up while sitting - wash and dry feet carefully every day - wear comfortable, cotton socks - wear comfortable, well-fitting shoes - check blood pressure 3 times per week - choose a place to take my blood pressure (home, clinic or office, retail store) - write blood pressure results in a log or diary - learn about high blood pressure - keep a blood pressure log - take blood pressure log to all doctor appointments - call doctor for signs and symptoms of high blood pressure - develop an action plan for high blood pressure - keep all doctor appointments - take medications for blood pressure exactly as prescribed - report new symptoms to your doctor - eat more whole grains, fruits and vegetables, lean meats and healthy fats - call for medicine refill 2 or 3 days before it runs out - take all medications exactly as prescribed - call doctor with any symptoms you believe are related to your medicine - call doctor when you experience any new symptoms - go to all doctor appointments as scheduled - adhere to prescribed diet: heart healthy/ADA       Plan:Telephone follow up appointment with care management team member scheduled for:  06-07-2021 at 230 pm  [place], MSN, [place] Mobile: [phone]

## 2021-04-05 NOTE — Patient Instructions (Signed)
Visit Information  Thank you for taking time to visit with me today. Please don't hesitate to contact me if I can be of assistance to you before our next scheduled telephone appointment.  Following are the goals we discussed today:  RNCM Clinical Goal(s):  Patient will verbalize understanding of plan for management of CHF, HTN, HLD, DMII, and Chronic Pain   verbalize basic understanding of CHF, HTN, HLD, DMII, Osteoarthritis, and Chronic Pain disease process and self health management plan  take all medications exactly as prescribed and will call provider for medication related questions demonstrate understanding of rationale for each prescribed medication  attend all scheduled medical appointments: 04-25-2021 at 0840 am demonstrate improved and ongoing adherence to prescribed treatment plan for CHF, HTN, HLD, DMII, Osteoarthritis, and Chronic Pain as evidenced by daily monitoring and recording of CBG  adherence to ADA/ carb modified diet adherence to prescribed medication regimen contacting provider for new or worsened symptoms or questions  demonstrate improved and ongoing health management independence  demonstrate a decrease in CHF, HTN, HLD, DMII, Osteoarthritis, and chronic pain exacerbations  demonstrate ongoing self health care management ability of effective management of chronic conditions through collaboration with RN Care manager, provider, and care team.    Interventions: 1:1 collaboration with primary care provider regarding development and update of comprehensive plan of care as evidenced by provider attestation and co-signature Inter-disciplinary care team collaboration (see longitudinal plan of care) Evaluation of current treatment plan related to  self management and patient's adherence to plan as established by provider     SDOH Barriers (Status: Goal on track: YES.)  Patient interviewed and SDOH assessment performed        SDOH Interventions     Flowsheet Row Most  Recent Value  SDOH Interventions    Food Insecurity Interventions Intervention Not Indicated  Financial Strain Interventions Intervention Not Indicated  Housing Interventions Intervention Not Indicated  Intimate Partner Violence Interventions Intervention Not Indicated  Physical Activity Interventions Other (Comments)  [The patient is doing exercises the PT has showed her]  Stress Interventions Intervention Not Indicated  Social Connections Interventions Other (Comment)  [good support system]  Transportation Interventions Intervention Not Indicated       Patient interviewed and appropriate assessments performed Provided patient with information about resources available for changes in SDOH and care guides available for any needs or resources in [facility] Discussed plans with patient for ongoing care management follow up and provided patient with direct contact information for care management team Advised patient to call the office for changes in [place], questions, or concerns       Heart Failure Interventions:  (Status: Goal on track: YES.) Basic overview and discussion of pathophysiology of Heart Failure reviewed; Provided education on low sodium diet; Reviewed Heart Failure Action Plan in depth and provided written copy; Assessed need for readable accurate scales in home; Provided education about placing scale on hard, flat surface; Advised patient to weigh each morning after emptying bladder; Discussed importance of daily weight and advised patient to weigh and record daily; Reviewed role of diuretics in prevention of fluid overload and management of heart failure; Discussed the importance of keeping all appointments with provider. 04-05-2021: Has upcoming appointment with pcp on 04-25-2021 at 0840 am; Provided patient with education about the role of exercise in the management of heart failure;   Diabetes:  (Status: Goal on track: YES.)      Lab Results  Component Value Date     HGBA1C 7.4 10/04/2020  Assessed patient's understanding of A1c goal: <7% Provided education to patient about basic DM disease process; Reviewed medications with patient and discussed importance of medication adherence. 03-05-2021: Incoming call from the patient asking if she could get the contact information for the pharm D. She is not out of her Trulicity but will need assistance with the refill. Number provided. Will also send an inbasket message to pharm D for follow up. 04-05-2021: Is working with the pharm D for ongoing support and management of medications ;        Reviewed prescribed diet with patient heart healthy/ADA diet. 04-05-2021: Is monitoring her dietary intake and is compliant with heart healthy/ADA diet ; Counseled on importance of regular laboratory monitoring as prescribed;        Discussed plans with patient for ongoing care management follow up and provided patient with direct contact information for care management team;      Provided patient with written educational materials related to hypo and hyperglycemia and importance of correct treatment. 04-05-2021: The patient states last week she had a low of 64. States she was asymptomatic. She states she did not feel worse. Education on making sure when blood sugars are that low to eat well. Review of sx and sx of hypoglycemia and hyperglycemia;       Reviewed scheduled/upcoming provider appointments including: 04-25-2021 at 0840 am;         Advised patient, providing education and rationale, to check cbg as directed and record. 02-22-2021: The patient states that her glucose readings have been good. 120-162 with the highest in the last two weeks of 147. 04-05-2021: The patient provided several readings of her blood sugars. Range has been 64 to 146. Denies any acute findings. Has her log to take with her to the provider office at upcoming visit.         call provider for findings outside established parameters;       Referral made to  pharmacy team for assistance with ongoing support and education with medication management;       Review of patient status, including review of consultants reports, relevant laboratory and other test results, and medications completed;       Screening for signs and symptoms of depression related to chronic disease state;        Assessed social determinant of health barriers;          Hyperlipidemia:  (Status: Goal on track: YES.)      Lab Results  Component Value Date    CHOL 170 05/03/2020    HDL 54 05/03/2020    LDLCALC 97 05/03/2020    TRIG 103 05/03/2020    CHOLHDL 3.1 05/03/2020      Medication review performed; medication list updated in electronic medical record.  Provider established cholesterol goals reviewed; Counseled on importance of regular laboratory monitoring as prescribed. 03-05-2021: Scheduled for labs in December 2022; Provided HLD educational materials; Reviewed role and benefits of statin for ASCVD risk reduction; Discussed strategies to manage statin-induced myalgias; Reviewed importance of limiting foods high in cholesterol. 04-05-2021: Is compliant with heart healthy/ADA diet ;   Hypertension: (Status: Goal on track: YES.) Last practice recorded BP readings:     BP Readings from Last 3 Encounters:  01/11/21 136/69  11/23/20 (!) 139/57  11/09/20 120/68  Most recent eGFR/CrCl: No results found for: EGFR  No components found for: CRCL   Evaluation of current treatment plan related to hypertension self management and patient's adherence to plan as established  by provider. 04-05-2021: The patient is compliant with plan of care for HTN and maintenance of Heart health;   Provided education to patient re: stroke prevention, s/s of heart attack and stroke; Reviewed prescribed diet heart healthy/ADA Reviewed medications with patient and discussed importance of compliance;  Discussed plans with patient for ongoing care management follow up and provided patient with  direct contact information for care management team; Advised patient, providing education and rationale, to monitor blood pressure daily and record, calling PCP for findings outside established parameters. 04-05-2021: The patient is checking her blood pressures daily. The patient denies any issues with blood pressures. Systolic range is 979 to 892 and diastolic 11-94. Review of goal of systolic of <174 and diastolic <08. The patient denies any headaches or other issues related to possible elevation in blood pressures;  Reviewed scheduled/upcoming provider appointments including: 04-25-2021 at 0840 am Provided education on prescribed diet heart healthy/ADA ;  Discussed complications of poorly controlled blood pressure such as heart disease, stroke, circulatory complications, vision complications, kidney impairment, sexual dysfunction;    Pain, OA, Sjogren's syndrome:  (Status: Goal on track: YES.) Pain assessment performed. 04-05-2021: The patient states her pain level today is 5 and she is tolerating her pain much better.  Medications reviewed. 02-22-2021: Patient has been started on Plaquinel 200 mg BID. The patient takes Tylenol for OA pain and discomfort. 04-05-2021: The patient is compliant with medications, has ongoing support from the pharm D.  Reviewed provider established plan for pain management. 02-22-2021: The patient saw the specialist on Monday. She is going to take the Planquinel and see how that works. She has been working with PT and has strengthening exercises to do. She states that they may do more testing but are going to see how this works for now. She has been diagnosed with Sjogren's syndrome.  Also she has carpal tunnel syndrome and OA. 04-05-2021: The patient goes tomorrow to the specialist at 2 pm to follow up with the pain and numbness in her hands. She states she is doing much better with the exercises she has been doing and the recommendations given to her by the provider.   Discussed importance of adherence to all scheduled medical appointments; Counseled on the importance of reporting any/all new or changed pain symptoms or management strategies to pain management provider; Advised patient to report to care team affect of pain on daily activities; Discussed use of relaxation techniques and/or diversional activities to assist with pain reduction (distraction, imagery, relaxation, massage, acupressure, TENS, heat, and cold application; Reviewed with patient prescribed pharmacological and nonpharmacological pain relief strategies; Advised patient to discuss unresolved pain or changes in level or intensity of pain with provider; Screening for signs and symptoms of depression related to chronic disease state;  Assessed social determinant of health barriers;      Questionable Abscess to back  (Status: New goal.) Short Term Goal  Evaluation of current treatment plan related to  Possible abscess to back  ,  self-management and patient's adherence to plan as established by provider. Discussed plans with patient for ongoing care management follow up and provided patient with direct contact information for care management team Advised patient to call the office for changes in the area on her back, for sx and sx of infection, and to call the office for a sooner appointment if need for evaluation on what the patient believes to be an abscess on her back. Many years ago she had something similar to this on her back  in the same area and it had to be drained. ; Provided education to patient re: sx and sx of infection, monitoring for chills, elevation in body temperature, increased redness to the area, pustulant drainage from the area; Reviewed scheduled/upcoming provider appointments including 04-25-2021 at [place] am; Advised patient to discuss area to the back that is sore with a little redness. with provider;  Collaboration with pcp about the patients concern about area to her back  she calls a "possible abscess". The patient had something similar in the same area several years ago and it had to have surgical intervention to be drained. The patient states this are is sore and per her daughter a little red. The patient has upcoming appointment with pcp. Advised the patient to monitor for sx and sx of infection and to call for a sooner appointment if needed.    Patient Goals/Self-Care Activities: Patient will self administer medications as prescribed as evidenced by self report/primary caregiver report  Patient will attend all scheduled provider appointments as evidenced by clinician review of documented attendance to scheduled appointments and patient/caregiver report Patient will call pharmacy for medication refills as evidenced by patient report and review of pharmacy fill history as appropriate Patient will attend church or other social activities as evidenced by patient report Patient will continue to perform ADL's independently as evidenced by patient/caregiver report Patient will continue to perform IADL's independently as evidenced by patient/caregiver report Patient will call provider office for new concerns or questions as evidenced by review of documented incoming telephone call notes and patient report Patient will work with BSW to address care coordination needs and will continue to work with the clinical team to address health care and disease management related needs as evidenced by documented adherence to scheduled care management/care coordination appointments - call office if I gain more than 2 pounds in one day or 5 pounds in one week - keep legs up while sitting - track weight in diary - use salt in moderation - watch for swelling in feet, ankles and legs every day - weigh myself daily - begin a heart failure diary - bring diary to all appointments - develop a rescue plan - follow rescue plan if symptoms flare-up - eat more whole grains, fruits and  vegetables, lean meats and healthy fats - know when to call the doctor:for changes in HF and a rescue plan for periods of exacerbation - track symptoms and what helps feel better or worse - keep appointment with eye doctor - check blood sugar at prescribed times: before meals and at bedtime, when you have symptoms of low or high blood sugar, and before and after exercise - check feet daily for cuts, sores or redness - enter blood sugar readings and medication or insulin into daily log - take the blood sugar log to all doctor visits - trim toenails straight across - drink 6 to 8 glasses of water each day - eat fish at least once per week - fill half of plate with vegetables - keep a food diary - limit fast food meals to no more than 1 per week - manage portion size - prepare main meal at home 3 to 5 days each week - read food labels for fat, fiber, carbohydrates and portion size - reduce red meat to 2 to 3 times a week - keep feet up while sitting - wash and dry feet carefully every day - wear comfortable, cotton socks - wear comfortable, well-fitting shoes - check blood pressure  3 times per week - choose a place to take my blood pressure (home, clinic or office, retail store) - write blood pressure results in a log or diary - learn about high blood pressure - keep a blood pressure log - take blood pressure log to all doctor appointments - call doctor for signs and symptoms of high blood pressure - develop an action plan for high blood pressure - keep all doctor appointments - take medications for blood pressure exactly as prescribed - report new symptoms to your doctor - eat more whole grains, fruits and vegetables, lean meats and healthy fats - call for medicine refill 2 or 3 days before it runs out - take all medications exactly as prescribed - call doctor with any symptoms you believe are related to your medicine - call doctor when you experience any new symptoms - go to all  doctor appointments as scheduled - adhere to prescribed diet: heart healthy/ADA          Our next appointment is by telephone on 06-07-2021 at 230 pm  Please call the care guide team at [phone] if you need to cancel or reschedule your appointment.   If you are experiencing a Mental Health or [place] or need someone to talk to, please call the Suicide and Crisis Lifeline: 988 call the [place] National Suicide Prevention Lifeline: 442-134-5709 or TTY: 802-525-7930 TTY (252)785-0526) to talk to a trained counselor call 1-800-273-TALK (toll free, 24 hour hotline)   Patient verbalizes understanding of instructions provided today and agrees to view in [place].   Noreene Larsson RN, MSN, [place] Mobile: [phone]

## 2021-04-06 DIAGNOSIS — G5603 Carpal tunnel syndrome, bilateral upper limbs: Secondary | ICD-10-CM | POA: Diagnosis not present

## 2021-04-06 DIAGNOSIS — R200 Anesthesia of skin: Secondary | ICD-10-CM | POA: Diagnosis not present

## 2021-04-06 DIAGNOSIS — G249 Dystonia, unspecified: Secondary | ICD-10-CM | POA: Diagnosis not present

## 2021-04-06 DIAGNOSIS — M542 Cervicalgia: Secondary | ICD-10-CM | POA: Diagnosis not present

## 2021-04-11 ENCOUNTER — Ambulatory Visit: Payer: Medicare Other | Admitting: Pharmacist

## 2021-04-11 DIAGNOSIS — I10 Essential (primary) hypertension: Secondary | ICD-10-CM

## 2021-04-11 DIAGNOSIS — E1169 Type 2 diabetes mellitus with other specified complication: Secondary | ICD-10-CM

## 2021-04-11 NOTE — Chronic Care Management (AMB) (Signed)
Chronic Care Management CCM Pharmacy Note  04/11/2021 Name:  Diana Harmon MRN:  [id number] DOB:  1945-05-24   Subjective: Diana Harmon is an 75 y.o. year old female who is a primary patient of Diana Hauser, DO.  The CCM team was consulted for assistance with disease management and care coordination needs.    Engaged with patient by telephone for follow up visit for pharmacy case management and/or care coordination services.   Objective:  Medications Reviewed Today     Reviewed by Diana Ingles, RN (Case Manager) on 04/05/21 at 32  Med List Status: <None>   Medication Order Taking? Sig Documenting Provider Last Dose Status Informant  ACCU-CHEK GUIDE test strip [id number] No Use to check blood sugar twice daily Diana Hauser, DO Taking Active   Accu-Chek Softclix Lancets lancets [id number] No Use to check blood sugar twice daily Diana Hauser, DO Taking Active   acetaminophen (TYLENOL) 500 MG tablet [id number] No Take 1,000 mg by mouth every 6 (six) hours as needed for moderate pain.  [provider] Taking Active Child  Artificial Tear Solution (GENTEAL TEARS) 0.1-0.2-0.3 % SOLN [id number] No Place 1 drop into both eyes 3 (three) times daily as needed (dry/irritated eyes.). [provider] Taking Active Child  aspirin 81 MG chewable tablet [id number] No Chew 1 tablet (81 mg total) by mouth daily. Eileen Stanford, PA-C Taking Active Child  B COMPLEX-C PO [id number] No Take 10 mLs by mouth daily. [provider] Taking Active Child  baclofen (LIORESAL) 10 MG tablet [id number] No TAKE 1 TO 2 TABLETS BY MOUTH TWICE DAILY AS NEEDED FOR MUSCLE SPASM Parks Ranger, Devonne Doughty, DO Taking Active Child  Blood Glucose Monitoring Suppl (ACCU-CHEK GUIDE) w/Device KIT [id number] No Use to check blood sugar twice daily Diana Hauser, DO Taking Active   Cholecalciferol (VITAMIN D3) 125 MCG (5000 UT) TABS [id number]  No Take 5,000 Units by mouth daily. [provider] Taking Active Child  Discontinued 09/05/19 1316   Ferrous Sulfate (IRON) 325 (65 Fe) MG TABS [id number] No TAKE 1 TABLET BY MOUTH TWICE DAILY WITH A MEAL [name], Devonne Doughty, DO Taking Active Child  furosemide (LASIX) 20 MG tablet [id number] No Take 1 tablet (20 mg total) by mouth daily as needed for edema. End, Harrell Gave, MD Taking Active Child  hydrochlorothiazide (HYDRODIURIL) 25 MG tablet [id number] No Take 1 tablet (25 mg total) by mouth daily. End, Harrell Gave, MD Taking Active   hydroxychloroquine (PLAQUENIL) 200 MG tablet [id number] No Take 200 mg by mouth 2 (two) times daily. [provider] Taking Active   lactobacillus acidophilus (BACID) TABS tablet [id number] No Take 2 tablets by mouth 2 (two) times daily as needed (digestive health.). [provider] Taking Active            Med Note Vianne Bulls Nov 03, 2019  3:06 PM)    levocetirizine (XYZAL) 5 MG tablet [id number] No TAKE 1 TABLET BY MOUTH ONCE DAILY IN THE EVENING  Patient taking differently: Take 1/2 tablet (2.5 mg) daily in the evening as needed for allergies   Diana Hauser, DO Taking Active   losartan (COZAAR) 50 MG tablet [id number] No Take 1 tablet (50 mg total) by mouth daily. End, Harrell Gave, MD Taking Active   magnesium oxide (MAG-OX) 400 MG tablet [id number] No Take 400 mg by mouth daily. [provider] Taking Active   [facility] injection [id number] No Inject 0.5 mL into muscle for shingles vaccine.  Repeat dose in 2-6 months. Diana Hauser, DO Taking Active   simvastatin (ZOCOR) 20 MG tablet [id number] No TAKE 1 TABLET BY MOUTH ONCE DAILY AT  6  PM [name], Devonne Doughty, DO Taking Active   TRULICITY 9.20 FE/0.7HQ SOPN [id number] No Inject 0.75 mg into the skin once a week. Diana Hauser, DO Taking Active             Pertinent Labs:  Lab Results  Component Value Date   HGBA1C 7.4  10/04/2020   Lab Results  Component Value Date   CHOL 170 05/03/2020   HDL 54 05/03/2020   LDLCALC 97 05/03/2020   TRIG 103 05/03/2020   CHOLHDL 3.1 05/03/2020   Lab Results  Component Value Date   CREATININE 1.04 (H) 01/11/2021   BUN 29 (H) 01/11/2021   NA 137 01/11/2021   K 3.9 01/11/2021   CL 103 01/11/2021   CO2 27 01/11/2021   BP Readings from Last 3 Encounters:  01/11/21 136/69  11/23/20 (!) 139/57  11/09/20 120/68   Pulse Readings from Last 3 Encounters:  01/11/21 80  11/23/20 85  11/09/20 78     SDOH:  (Social Determinants of Health) assessments and interventions performed:    [place]  Review of patient past medical history, allergies, medications, health status, including review of consultants reports, laboratory and other test data, was performed as part of comprehensive evaluation and provision of chronic care management services.   Care Plan : PharmD - Medication Assistance/Management  Updates made by Rennis Petty, RPH-CPP since 04/11/2021 12:00 AM     Problem: Disease Progression      Long-Range Goal: Disease Progression Prevented or Minimized   Start Date: 10/30/2020  Expected End Date: 01/28/2021  This Visit's Progress: On track  Recent Progress: On track  Priority: High  Note:   Current Barriers:  Unable to independently afford treatment regimen Patient APPROVED for [facility] Patient Assistance Program for Trulicity, eligible from 11/21/20-05/05/21  Pharmacist Clinical Goal(s):  Over the next 90 days, patient will verbalize ability to afford treatment regimen through collaboration with PharmD and provider.   Interventions: 1:1 collaboration with Diana Hauser, DO regarding development and update of comprehensive plan of care as evidenced by provider attestation and co-signature Inter-disciplinary care team collaboration (see longitudinal plan of care) Receive a message from CCM Nurse Case Manager requesting call to patient  regarding questions about patient assistance program Perform chart review. Patient seen for Office Visit with [facility] [place] on 11/7 Office Visit with [facility] on 11/10 Office Visit with [facility] on 12/2 Patient interested in receiving 90 day supply of hydroxychloroquine Rx Encourage patient to follow up with [facility] regarding request for 90 day supply and to schedule 3 month follow up appointment  T2DM: Current treatment: Trulicity 4.98 mg weekly Previous therapies: metformin Reports recent home blood sugar readings:  Morning Fasting After Breakfast Before Supper After Supper Notes  1 - December 152 - 132 -   2 - December - 136 - 156   3 - December 64* - - - *Denies symptoms with reading  4 - December - 129 131 -   5 - December 130 - 159 -   6 - December - 128 - 114   7 - December -      Denies symptoms of hypoglycemia Attributes variation in blood sugar to dietary choices Reports working on making positive dietary changes, including: Drinking more water and having  more non-starchy vegetables  Baking chicken and eating at home more Noticing impact of dietary choices on blood sugar Cholesterol management: simvastatin 20 mg daily Exercise Reported exercise limited by arthritis.  Previously went to the pool, but stopped going due to COVID-19 pandemic Reported uses upright exercise bike at home some depending on her arthritis Encourage patient to continue to monitor home blood sugar, keep log and bring record with her to upcoming appointment with PCP   HTN: Current treatment: HCTZ 25 mg daily Losartan 50 mg daily Furosemide 20 mg daily as needed for edema Reports recent home blood pressure readings:  Morning BP Evening BP  1 - December - 153/72, HR 75  2 - December 125/58, HR 75 117/55, HR 91  3 - December 122/57, HR 87 133/55, HR 75  4 - December 113/57, HR 76 -  5 - December 118/55, HR 76 125/60, HR 87   6 - December - 130/60, HR 83  7 - December     Patient currently using wrist monitor.  Encouraged patient to consider obtaining upper arm blood pressure monitor for greater accuracy Counsel on using Part D health plan OTC benefit Patient plans to use OTC benefit card in January to obtain monitor Counsel on importance of obtaining correct cuff size. Patient confirms will order a size large cuff Counsel on impact of salt/sodium in diet on blood pressure. Encourage patient to limit sodium intake Encouraged patient to monitor daily weights and follow direction from Cardiology for taking furosemide as needed  Encourage patient to bring record of daily weights and home BP readings with her to upcoming appointment with PCP   Medication Assistance: Reports has sufficient supply of Trulicity to last until end of calendar year Continue to collaborate with Elizabeth Simcox to aid patient in reapply to [place] patient assistance program for 2023 calendar year   Patient Goals/Self-Care Activities Over the next 90 days, patient will:  - take medications as prescribed - check glucose, document, and provide at future appointments - check blood pressure, document, and provide at future appointments - collaborate with provider on medication access solutions - attend medical appointments as scheduled  Follow Up Plan: Telephone follow up appointment with care management team member scheduled for: 05/30/2021 at 10:30 am       Wallace Cullens, PharmD, Para [place], [place] [phone]

## 2021-04-11 NOTE — Patient Instructions (Signed)
Visit Information  Thank you for taking time to visit with me today. Please don't hesitate to contact me if I can be of assistance to you before our next scheduled telephone appointment.  Following are the goals we discussed today:  Our goal A1c is less than 7%. This corresponds with fasting sugars less than 130 and 2 hour after meal sugars less than 180. Please keep a log of your results when checking your blood sugar  Our goal bad cholesterol, or LDL, is less than 70 . This is why it is important to continue taking your simvastatin  Please check your home blood pressure, keep a log of the results and bring this with you to your medical appointments.  Feel free to call me with any questions or concerns. I look forward to our next call!   Wallace Cullens, PharmD, Para [place], CPP Clinical Pharmacist [facility] [phone]  Our next appointment is by telephone on 05/30/2021 at 10:30 am  Please call the care guide team at [phone] if you need to cancel or reschedule your appointment.    The patient verbalized understanding of instructions, educational materials, and care plan provided today and declined offer to receive copy of patient instructions, educational materials, and care plan.

## 2021-04-14 ENCOUNTER — Other Ambulatory Visit: Payer: Self-pay | Admitting: Family Medicine

## 2021-04-14 ENCOUNTER — Other Ambulatory Visit: Payer: Self-pay | Admitting: Internal Medicine

## 2021-04-14 DIAGNOSIS — M62838 Other muscle spasm: Secondary | ICD-10-CM

## 2021-04-14 NOTE — Telephone Encounter (Signed)
Requested medication (s) are due for refill today: yes  Requested medication (s) are on the active medication list: yes  Last refill:  07/07/19 #60 2 RF  Future visit scheduled: yes  Notes to clinic:  med not delegated to NT to RF   Requested Prescriptions  Pending Prescriptions Disp Refills   baclofen (LIORESAL) 10 MG tablet [Pharmacy Med Name: Baclofen 10 MG Oral Tablet] 60 tablet 0    Sig: TAKE 1 TO 2 TABLETS BY MOUTH TWICE DAILY AS NEEDED FOR MUSCLE SPASM     Not Delegated - Analgesics:  Muscle Relaxants Failed - 04/14/2021 11:39 AM      Failed - This refill cannot be delegated      Passed - Valid encounter within last 6 months    Recent Outpatient Visits           4 months ago Type 2 diabetes mellitus with other specified complication, without long-term current use of insulin ([place])   [facility], Devonne Doughty, DO   8 months ago Type 2 diabetes mellitus with other specified complication, without long-term current use of insulin ([place])   [facility], Devonne Doughty, DO   1 year ago Type 2 diabetes mellitus with other specified complication, without long-term current use of insulin [facility])   [place], DO   1 year ago SIRS (systemic inflammatory response syndrome) [facility])   [place], DO   1 year ago Herpes zoster without complication   [facility], Devonne Doughty, DO       Future Appointments             In 1 week Parks Ranger, Devonne Doughty, DO [facility], [place]   In 2 weeks  [facility], [facility]

## 2021-04-16 NOTE — Telephone Encounter (Signed)
Please schedule 12 month F/U appointment. Thank you! 

## 2021-04-16 NOTE — Telephone Encounter (Signed)
Scheduled

## 2021-04-19 ENCOUNTER — Other Ambulatory Visit: Payer: Self-pay

## 2021-04-19 DIAGNOSIS — H524 Presbyopia: Secondary | ICD-10-CM | POA: Diagnosis not present

## 2021-04-19 DIAGNOSIS — E1169 Type 2 diabetes mellitus with other specified complication: Secondary | ICD-10-CM

## 2021-04-19 DIAGNOSIS — E785 Hyperlipidemia, unspecified: Secondary | ICD-10-CM

## 2021-04-19 DIAGNOSIS — Z0000 Encounter for general adult medical examination without abnormal findings: Secondary | ICD-10-CM

## 2021-04-19 DIAGNOSIS — I5032 Chronic diastolic (congestive) heart failure: Secondary | ICD-10-CM

## 2021-04-19 DIAGNOSIS — I10 Essential (primary) hypertension: Secondary | ICD-10-CM

## 2021-04-19 LAB — HM DIABETES EYE EXAM

## 2021-04-20 ENCOUNTER — Other Ambulatory Visit: Payer: Medicare Other

## 2021-04-20 DIAGNOSIS — E1169 Type 2 diabetes mellitus with other specified complication: Secondary | ICD-10-CM | POA: Diagnosis not present

## 2021-04-20 DIAGNOSIS — I10 Essential (primary) hypertension: Secondary | ICD-10-CM | POA: Diagnosis not present

## 2021-04-20 DIAGNOSIS — I5032 Chronic diastolic (congestive) heart failure: Secondary | ICD-10-CM | POA: Diagnosis not present

## 2021-04-20 DIAGNOSIS — E785 Hyperlipidemia, unspecified: Secondary | ICD-10-CM | POA: Diagnosis not present

## 2021-04-21 LAB — LIPID PANEL
Cholesterol: 155 mg/dL (ref <200)
HDL: 65 mg/dL (ref >=50)
LDL Cholesterol (Calc): 72 mg/dL (calc)
Non-HDL Cholesterol (Calc): 90 mg/dL (calc) (ref <130)
Total CHOL/HDL Ratio: 2.4 (calc) (ref <5.0)
Triglycerides: 95 mg/dL (ref <150)

## 2021-04-21 LAB — CBC WITH DIFFERENTIAL/PLATELET
Absolute Monocytes: 347 cells/uL (ref 200–950)
Basophils Absolute: 20 cells/uL (ref 0–200)
Basophils Relative: 0.6 %
Eosinophils Absolute: 150 cells/uL (ref 15–500)
Eosinophils Relative: 4.4 %
HCT: 32.4 % — ABNORMAL LOW (ref 35.0–45.0)
Hemoglobin: 10.3 g/dL — ABNORMAL LOW (ref 11.7–15.5)
Lymphs Abs: 1482 cells/uL (ref 850–3900)
MCH: 29.9 pg (ref 27.0–33.0)
MCHC: 31.8 g/dL — ABNORMAL LOW (ref 32.0–36.0)
MCV: 94.2 fL (ref 80.0–100.0)
MPV: 10 fL (ref 7.5–12.5)
Monocytes Relative: 10.2 %
Neutro Abs: 1401 cells/uL — ABNORMAL LOW (ref 1500–7800)
Neutrophils Relative %: 41.2 %
Platelets: 160 10*3/uL (ref 140–400)
RBC: 3.44 10*6/uL — ABNORMAL LOW (ref 3.80–5.10)
RDW: 13.1 % (ref 11.0–15.0)
Total Lymphocyte: 43.6 %
WBC: 3.4 10*3/uL — ABNORMAL LOW (ref 3.8–10.8)

## 2021-04-21 LAB — COMPLETE METABOLIC PANEL WITH GFR
AG Ratio: 1.1 (calc) (ref 1.0–2.5)
ALT: 17 U/L (ref 6–29)
AST: 26 U/L (ref 10–35)
Albumin: 3.6 g/dL (ref 3.6–5.1)
Alkaline phosphatase (APISO): 56 U/L (ref 37–153)
BUN/Creatinine Ratio: 24 (calc) — ABNORMAL HIGH (ref 6–22)
BUN: 28 mg/dL — ABNORMAL HIGH (ref 7–25)
CO2: 29 mmol/L (ref 20–32)
Calcium: 9.1 mg/dL (ref 8.6–10.4)
Chloride: 106 mmol/L (ref 98–110)
Creat: 1.18 mg/dL — ABNORMAL HIGH (ref 0.60–1.00)
Globulin: 3.4 g/dL (calc) (ref 1.9–3.7)
Glucose, Bld: 213 mg/dL — ABNORMAL HIGH (ref 65–99)
Potassium: 4 mmol/L (ref 3.5–5.3)
Sodium: 142 mmol/L (ref 135–146)
Total Bilirubin: 0.3 mg/dL (ref 0.2–1.2)
Total Protein: 7 g/dL (ref 6.1–8.1)
eGFR: 48 mL/min/{1.73_m2} — ABNORMAL LOW (ref >=60)

## 2021-04-21 LAB — HEMOGLOBIN A1C
Hgb A1c MFr Bld: 6.7 % of total Hgb — ABNORMAL HIGH (ref <5.7)
Mean Plasma Glucose: 146 mg/dL
eAG (mmol/L): 8.1 mmol/L

## 2021-04-21 LAB — TSH: TSH: 3 mIU/L (ref 0.40–4.50)

## 2021-04-25 ENCOUNTER — Other Ambulatory Visit: Payer: Self-pay

## 2021-04-25 ENCOUNTER — Encounter: Payer: Self-pay | Admitting: Family Medicine

## 2021-04-25 ENCOUNTER — Ambulatory Visit (INDEPENDENT_AMBULATORY_CARE_PROVIDER_SITE_OTHER): Payer: Medicare Other | Admitting: Family Medicine

## 2021-04-25 VITALS — BP 140/55 | HR 86 | Ht 60.0 in | Wt 232.2 lb

## 2021-04-25 DIAGNOSIS — I10 Essential (primary) hypertension: Secondary | ICD-10-CM | POA: Diagnosis not present

## 2021-04-25 DIAGNOSIS — E785 Hyperlipidemia, unspecified: Secondary | ICD-10-CM | POA: Diagnosis not present

## 2021-04-25 DIAGNOSIS — E1169 Type 2 diabetes mellitus with other specified complication: Secondary | ICD-10-CM | POA: Diagnosis not present

## 2021-04-25 DIAGNOSIS — Z0000 Encounter for general adult medical examination without abnormal findings: Secondary | ICD-10-CM | POA: Diagnosis not present

## 2021-04-25 NOTE — Patient Instructions (Addendum)
Thank you for coming to the office today.  Likely some scar tissue on back from the prior cyst removal. No sign of active cyst or infection.  BP overall steady  Labs look good, kidney liver excellent.  A1c  Recent Labs    07/24/20 1036 10/04/20 0000 04/20/21 0821  HGBA1C 6.9* 7.4 6.7*   No change to medicines. Refills should be available.  We can order more Baclofen if you need it.  Updated vaccines.  Go ahead and proceed w/ COVID19 Omicron Bivalent Booster when ready at pharmacy.    Please schedule a Follow-up Appointment to: Return in about 6 months (around 10/24/2021) for 6 month follow-up DM A1c HTN.  If you have any other questions or concerns, please feel free to call the office or send a message through [place]. You may also schedule an earlier appointment if necessary.  Additionally, you may be receiving a survey about your experience at our office within a few days to 1 week by e-mail or mail. We value your feedback.  [name], DO [place]

## 2021-04-25 NOTE — Assessment & Plan Note (Signed)
A1c 6.7 improved No hyperglycemia or hypoglycemia Complications - other including hyperlipidemia, GERD, obesity - increases risk of future cardiovascular complications   Plan:  Continue Trulicity 0.75mg  weekly inj doing well on GLP1 - Lilly Cares PAP Off Metformin 2. Encourage improved lifestyle - low carb, low sugar diet, reduce portion size, continue improving regular exercise 3. Check CBG, bring log to next visit for review 4. Continue ASA, ARB, Statin

## 2021-04-25 NOTE — Progress Notes (Signed)
Subjective:    Patient ID: Diana Harmon, female    DOB: 11/21/1945, 75 y.o.   MRN: [id number]  Diana Harmon is a 75 y.o. female presenting on 04/25/2021 for Annual Exam  Here with daughter, [name]  HPI  Home BP readings 130-139/60-80 CBG 107-154, avg 135  CHRONIC DM, Type 2 with Hyperlipidemia, Morbid Obesity HTN CKD-III Improved A1c to 6.7 Admits diet changes since last visit had been on Trulicity 5.85FY weekly inj and major improved diet Side effect on metformin has stopped. CBGs: Improved. Avg 140s - stable overall Meds - Trulicity 9.24MQ weekly inj - on financial assistance Reports good compliance, tolerating med well Currently on ARB, ASA 81, Simvastatin 80m Lifestyle: Improving DM diet. Low carb - Followed by Dr PLorie Apleyoptometry - last exam done 10/19/20. Negative. Denies hypoglycemia  Cyst on back, prior history surgical excision, resolved. Maybe some scar tissue now  Health Maintenance: Due for COVID19 Booster new updated bivalent version Updated Shingles vaccine.  Depression screen [facility] 11/23/2020 04/25/2020 03/10/2020  Decreased Interest 1 0 0  Down, Depressed, Hopeless 0 0 0  PHQ - 2 Score 1 0 0  Altered sleeping 0 - -  Tired, decreased energy 2 - -  Change in appetite 0 - -  Feeling bad or failure about yourself  0 - -  Trouble concentrating 0 - -  Moving slowly or fidgety/restless 0 - -  Suicidal thoughts 0 - -  PHQ-9 Score 3 - -  Difficult doing work/chores Not difficult at all - -  Some recent data might be hidden    Past Medical History:  Diagnosis Date   Anemia    Arthritis    legs, knees, back, wrists   Diabetes mellitus without complication (HCC)    Hypertension    Nausea    Patient is [name]    S/P TAVR (transcatheter aortic valve replacement) 11/09/2019   s/p TAVR with a 26 mm Edwards S3U via the TF approach by Dr. MAngelena Form& Bartle   Severe aortic stenosis    S[place] (systemic inflammatory  response syndrome) [place]   Thyroid disease    Past Surgical History:  Procedure Laterality Date   CARDIAC CATHETERIZATION  10/19/2019   COLON SURGERY     caudarized polyps; rectal carcinoid excision ~ 2011   COLONOSCOPY     COLONOSCOPY WITH PROPOFOL N/A 03/13/2015   Procedure: COLONOSCOPY WITH PROPOFOL;  Surgeon: RManya Silvas MD;  [facility]  Service: Endoscopy;  Laterality: N/A;   ESOPHAGOGASTRODUODENOSCOPY N/A 03/13/2015   Procedure: ESOPHAGOGASTRODUODENOSCOPY (EGD);  Surgeon: RManya Silvas MD;  [facility];  Service: Endoscopy;  Laterality: N/A;   INTRAOPERATIVE TRANSTHORACIC ECHOCARDIOGRAM N/A 11/09/2019   Procedure: INTRAOPERATIVE TRANSTHORACIC ECHOCARDIOGRAM;  Surgeon: MBurnell Blanks MD;  [place]  Service: Open Heart Surgery;  Laterality: N/A;   PARATHYROIDECTOMY  11/20/2004   right superior parathyroidecomy for primary hyperparathyroidism/parathyroid adenoma   RIGHT/LEFT HEART CATH AND CORONARY ANGIOGRAPHY N/A 10/19/2019   Procedure: RIGHT/LEFT HEART CATH AND CORONARY ANGIOGRAPHY;  Surgeon: ENelva Bush MD;  [facility];  Service: Cardiovascular;  Laterality: N/A;   THYROIDECTOMY     TRANSCATHETER AORTIC VALVE REPLACEMENT, TRANSFEMORAL N/A 11/09/2019   Procedure: TRANSCATHETER AORTIC VALVE REPLACEMENT, TRANSFEMORAL;  Surgeon: MBurnell Blanks MD;  [place] [facility]  Service: Open Heart Surgery;  Laterality: N/A;   Social History   Socioeconomic History   Marital status: Divorced    Spouse name: Not on file  Number of children: 2   Years of education: 12   Highest education level: 12th grade  Occupational History   Occupation: retired-worked with nandicapped adults  Tobacco Use   Smoking status: Former    Years: 20.00    Types: Cigarettes    Quit date: 09/05/1979    Years since quitting: 41.6   Smokeless tobacco: Former  Scientific laboratory technician Use: Never used  Substance and Sexual  Activity   Alcohol use: Yes    Alcohol/week: 0.0 - 1.0 standard drinks    Comment: occ. wine   Drug use: No   Sexual activity: Not on file  Other Topics Concern   Not on file  Social History Narrative   Not on file   Social Determinants of Health   Financial Resource Strain: Low Risk    Difficulty of Paying Living Expenses: Not hard at all  Food Insecurity: No Food Insecurity   Worried About Charity fundraiser in the Last Year: Never true   [place] in the Last Year: Never true  Transportation Needs: No Transportation Needs   Lack of Transportation (Medical): No   Lack of Transportation (Non-Medical): No  Physical Activity: Insufficiently Active   Days of Exercise per Week: 2 days   Minutes of Exercise per Session: 40 min  Stress: No Stress Concern Present   Feeling of Stress : Not at all  Social Connections: Moderately Integrated   Frequency of Communication with Friends and Family: More than three times a week   Frequency of Social Gatherings with Friends and Family: Never   Attends Religious Services: More than 4 times per year   Active Member of [employer] or Organizations: Yes   Attends Music therapist: More than 4 times per year   Marital Status: Divorced  Human resources officer Violence: Not At Risk   Fear of Current or Ex-Partner: No   Emotionally Abused: No   Physically Abused: No   Sexually Abused: No   Family History  Problem Relation Age of Onset   Parkinson's disease Mother    Heart disease Father    Lupus Sister    Aneurysm Son    Lupus Son    Breast cancer Maternal Aunt        mat great aunt   Current Outpatient Medications on File Prior to Visit  Medication Sig   ACCU-CHEK GUIDE test strip Use to check blood sugar twice daily   Accu-Chek Softclix Lancets lancets Use to check blood sugar twice daily   acetaminophen (TYLENOL) 500 MG tablet Take 1,000 mg by mouth every 6 (six) hours as needed for moderate pain.    Artificial Tear Solution  (GENTEAL TEARS) 0.1-0.2-0.3 % SOLN Place 1 drop into both eyes 3 (three) times daily as needed (dry/irritated eyes.).   aspirin 81 MG chewable tablet Chew 1 tablet (81 mg total) by mouth daily.   B COMPLEX-C PO Take 10 mLs by mouth daily.   baclofen (LIORESAL) 10 MG tablet TAKE 1 TO 2 TABLETS BY MOUTH TWICE DAILY AS NEEDED FOR MUSCLE SPASM   Blood Glucose Monitoring Suppl (ACCU-CHEK GUIDE) w/Device KIT Use to check blood sugar twice daily   Cholecalciferol (VITAMIN D3) 125 MCG (5000 UT) TABS Take 5,000 Units by mouth daily.   Ferrous Sulfate (IRON) 325 (65 Fe) MG TABS TAKE 1 TABLET BY MOUTH TWICE DAILY WITH A MEAL   furosemide (LASIX) 20 MG tablet Take 1 tablet (20 mg total) by mouth daily as needed  for edema.   hydrochlorothiazide (HYDRODIURIL) 25 MG tablet Take 1 tablet (25 mg total) by mouth daily. PLEASE SCHEDULE OFFICE VISIT FOR FURTHER REFILLS. THANK YOU!   hydroxychloroquine (PLAQUENIL) 200 MG tablet Take 200 mg by mouth 2 (two) times daily.   lactobacillus acidophilus (BACID) TABS tablet Take 2 tablets by mouth 2 (two) times daily as needed (digestive health.).   levocetirizine (XYZAL) 5 MG tablet TAKE 1 TABLET BY MOUTH ONCE DAILY IN THE EVENING (Patient taking differently: Take 1/2 tablet (2.5 mg) daily in the evening as needed for allergies)   losartan (COZAAR) 50 MG tablet Take 1 tablet (50 mg total) by mouth daily.   magnesium oxide (MAG-OX) 400 MG tablet Take 400 mg by mouth daily.   SHINGRIX injection Inject 0.5 mL into muscle for shingles vaccine. Repeat dose in 2-6 months.   simvastatin (ZOCOR) 20 MG tablet TAKE 1 TABLET BY MOUTH ONCE DAILY AT  6  PM   TRULICITY 2.22 LN/9.8XQ SOPN Inject 0.75 mg into the skin once a week.   [DISCONTINUED] Cromolyn Sodium (NASAL ALLERGY NA) Place 1 spray into the nose daily as needed (allergies).   No current facility-administered medications on file prior to visit.    Review of Systems Per HPI unless specifically indicated above       Objective:    BP (!) 140/55    Pulse 86    Ht 5' (1.524 m)    Wt 232 lb 3.2 oz (105.3 kg)    SpO2 100%    BMI 45.35 kg/m   Wt Readings from Last 3 Encounters:  04/25/21 232 lb 3.2 oz (105.3 kg)  01/11/21 232 lb 9.6 oz (105.5 kg)  11/23/20 230 lb 9.6 oz (104.6 kg)    Physical Exam Vitals and nursing note reviewed.  Constitutional:      General: She is not in acute distress.    Appearance: She is well-developed. She is not diaphoretic.     Comments: Well-appearing, comfortable, cooperative  HENT:     Head: Normocephalic and atraumatic.  Eyes:     General:        Right eye: No discharge.        Left eye: No discharge.     Conjunctiva/sclera: Conjunctivae normal.  Neck:     Thyroid: No thyromegaly.  Cardiovascular:     Rate and Rhythm: Normal rate and regular rhythm.     Heart sounds: Normal heart sounds. No murmur heard. Pulmonary:     Effort: Pulmonary effort is normal. No respiratory distress.     Breath sounds: Normal breath sounds. No wheezing or rales.  Musculoskeletal:        General: Normal range of motion.     Cervical back: Normal range of motion and neck supple.  Lymphadenopathy:     Cervical: No cervical adenopathy.  Skin:    General: Skin is warm and dry.     Findings: No erythema or rash.  Neurological:     Mental Status: She is alert and oriented to person, place, and time.  Psychiatric:        Behavior: Behavior normal.     Comments: Well groomed, good eye contact, normal speech and thoughts    Diabetic Foot Exam - Simple   Simple Foot Form Diabetic Foot exam was performed with the following findings: Yes 04/25/2021  9:05 AM  Visual Inspection See comments: Yes Sensation Testing Intact to touch and monofilament testing bilaterally: Yes Pulse Check Posterior Tibialis and Dorsalis pulse intact bilaterally: Yes  Comments Bilateral feet with mild dry skin some callus formation, no breaks in skin no ulceration. Intact monofilament.      Results for  orders placed or performed in visit on 04/19/21  TSH  Result Value Ref Range   TSH 3.00 0.40 - 4.50 mIU/L  Hemoglobin A1c  Result Value Ref Range   Hgb A1c MFr Bld 6.7 (H) <5.7 % of total Hgb   Mean Plasma Glucose 146 mg/dL   eAG (mmol/L) 8.1 mmol/L  CBC with Differential/Platelet  Result Value Ref Range   WBC 3.4 (L) 3.8 - 10.8 Thousand/uL   RBC 3.44 (L) 3.80 - 5.10 Million/uL   Hemoglobin 10.3 (L) 11.7 - 15.5 g/dL   HCT 32.4 (L) 35.0 - 45.0 %   MCV 94.2 80.0 - 100.0 fL   MCH 29.9 27.0 - 33.0 pg   MCHC 31.8 (L) 32.0 - 36.0 g/dL   RDW 13.1 11.0 - 15.0 %   Platelets 160 140 - 400 Thousand/uL   MPV 10.0 7.5 - 12.5 fL   Neutro Abs 1,401 (L) 1,500 - 7,800 cells/uL   Lymphs Abs 1,482 850 - 3,900 cells/uL   Absolute Monocytes 347 200 - 950 cells/uL   Eosinophils Absolute 150 15 - 500 cells/uL   Basophils Absolute 20 0 - 200 cells/uL   Neutrophils Relative % 41.2 %   Total Lymphocyte 43.6 %   Monocytes Relative 10.2 %   Eosinophils Relative 4.4 %   Basophils Relative 0.6 %  Lipid panel  Result Value Ref Range   Cholesterol 155 <200 mg/dL   HDL 65 > OR = 50 mg/dL   Triglycerides 95 <150 mg/dL   LDL Cholesterol (Calc) 72 mg/dL (calc)   Total CHOL/HDL Ratio 2.4 <5.0 (calc)   Non-HDL Cholesterol (Calc) 90 <130 mg/dL (calc)  COMPLETE METABOLIC PANEL WITH GFR  Result Value Ref Range   Glucose, Bld 213 (H) 65 - 99 mg/dL   BUN 28 (H) 7 - 25 mg/dL   Creat 1.18 (H) 0.60 - 1.00 mg/dL   eGFR 48 (L) > OR = 60 mL/min/1.70m   BUN/Creatinine Ratio 24 (H) 6 - 22 (calc)   Sodium 142 135 - 146 mmol/L   Potassium 4.0 3.5 - 5.3 mmol/L   Chloride 106 98 - 110 mmol/L   CO2 29 20 - 32 mmol/L   Calcium 9.1 8.6 - 10.4 mg/dL   Total Protein 7.0 6.1 - 8.1 g/dL   Albumin 3.6 3.6 - 5.1 g/dL   Globulin 3.4 1.9 - 3.7 g/dL (calc)   AG Ratio 1.1 1.0 - 2.5 (calc)   Total Bilirubin 0.3 0.2 - 1.2 mg/dL   Alkaline phosphatase (APISO) 56 37 - 153 U/L   AST 26 10 - 35 U/L   ALT 17 6 - 29 U/L       Assessment & Plan:   Problem List Items Addressed This Visit     Type 2 diabetes mellitus with other specified complication (HCC)    AE7O6.7 improved No hyperglycemia or hypoglycemia Complications - other including hyperlipidemia, GERD, obesity - increases risk of future cardiovascular complications   Plan:  Continue Trulicity 03.50KXweekly inj doing well on GLP1 - Lilly Cares PAP Off Metformin 2. Encourage improved lifestyle - low carb, low sugar diet, reduce portion size, continue improving regular exercise 3. Check CBG, bring log to next visit for review 4. Continue ASA, ARB, Statin       Morbid obesity ([place]   Hyperlipidemia associated with type 2 diabetes mellitus (  [place])    Controlled  Plan: 1. Continue current meds - Simvastatin 102m daily 2. Encourage improved lifestyle - low carb/cholesterol, reduce portion size, continue improving activity as tolerated  Future return for lipid panel      Essential hypertension    Well-controlled HTN - Home BP readings reviewed  Complication meniere's disease, mild AS, CKD II   Plan:  1.  Continue current BP regimen HCTZ 284mdaily, Losartan 5026maily refill 2. Encourage improved lifestyle - low sodium diet, regular exercise 3. Continue monitor BP outside office, bring readings to next visit, if persistently >140/90 or new symptoms notify office sooner      Other Visit Diagnoses     Annual physical exam    -  Primary       Updated Health Maintenance information Future COVID19 Booster at pharmacy. Reviewed recent lab results with patient Encouraged improvement to lifestyle with diet and exercise Goal of weight loss  Prior epidermoid cyst on back s/p excision years ago - resolved Likely some scar tissue on back from the prior cyst removal. No sign of active cyst or infection.  No change to medicines. Refills should be available.  We can order more Baclofen if you need it.    No orders of the defined types were  placed in this encounter.     Follow up plan: Return in about 6 months (around 10/24/2021) for 6 month follow-up DM A1c HTN.  AleNobie PutnamO [facility] 04/25/2021, 8:58 AM

## 2021-04-25 NOTE — Assessment & Plan Note (Signed)
Well-controlled HTN - Home BP readings reviewed  Complication meniere's disease, mild AS, CKD II   Plan:  1.  Continue current BP regimen HCTZ 25mg  daily, Losartan 50mg  daily refill 2. Encourage improved lifestyle - low sodium diet, regular exercise 3. Continue monitor BP outside office, bring readings to next visit, if persistently >140/90 or new symptoms notify office sooner

## 2021-04-25 NOTE — Assessment & Plan Note (Signed)
Controlled  Plan: 1. Continue current meds - Simvastatin 20mg  daily 2. Encourage improved lifestyle - low carb/cholesterol, reduce portion size, continue improving activity as tolerated  Future return for lipid panel

## 2021-04-26 ENCOUNTER — Ambulatory Visit: Payer: Medicare Other

## 2021-04-26 ENCOUNTER — Telehealth: Payer: Self-pay | Admitting: Pharmacy Technician

## 2021-04-26 DIAGNOSIS — Z596 Low income: Secondary | ICD-10-CM

## 2021-04-26 NOTE — Progress Notes (Signed)
[facility])                                            [place] Team    04/26/2021  Diana Harmon [birth year] [id number]  Care coordination call placed to [facility] in regard to Trulicity application.  Spoke to [name] who informed patient was APPROVED 05/06/21-05/05/22. She informs medication would begin to ship in January based on last fill date in 2022 and medication would be delivered to patient's home.  She informs [name] has a new pharmacy for 2023 named [facility] and their phone number is [phone].  [name] P. [name], [place]  [phone]

## 2021-05-04 ENCOUNTER — Ambulatory Visit: Payer: Medicare Other

## 2021-05-05 DIAGNOSIS — E1169 Type 2 diabetes mellitus with other specified complication: Secondary | ICD-10-CM | POA: Diagnosis not present

## 2021-05-05 DIAGNOSIS — I5032 Chronic diastolic (congestive) heart failure: Secondary | ICD-10-CM | POA: Diagnosis not present

## 2021-05-05 DIAGNOSIS — E7800 Pure hypercholesterolemia, unspecified: Secondary | ICD-10-CM

## 2021-05-05 DIAGNOSIS — I10 Essential (primary) hypertension: Secondary | ICD-10-CM

## 2021-05-05 DIAGNOSIS — M159 Polyosteoarthritis, unspecified: Secondary | ICD-10-CM

## 2021-05-05 DIAGNOSIS — E785 Hyperlipidemia, unspecified: Secondary | ICD-10-CM

## 2021-05-11 ENCOUNTER — Telehealth: Payer: Medicare Other

## 2021-05-12 ENCOUNTER — Other Ambulatory Visit: Payer: Self-pay | Admitting: Internal Medicine

## 2021-05-12 DIAGNOSIS — I10 Essential (primary) hypertension: Secondary | ICD-10-CM

## 2021-05-18 ENCOUNTER — Ambulatory Visit (INDEPENDENT_AMBULATORY_CARE_PROVIDER_SITE_OTHER): Payer: Medicare Other

## 2021-05-18 ENCOUNTER — Other Ambulatory Visit: Payer: Self-pay

## 2021-05-18 VITALS — BP 148/70 | HR 87 | Temp 98.0°F | Ht 60.0 in | Wt 232.6 lb

## 2021-05-18 DIAGNOSIS — Z0000 Encounter for general adult medical examination without abnormal findings: Secondary | ICD-10-CM | POA: Diagnosis not present

## 2021-05-18 NOTE — Patient Instructions (Signed)
Diana Harmon , Thank you for taking time to come for your Medicare Wellness Visit. I appreciate your ongoing commitment to your health goals. Please review the following plan we discussed and let me know if I can assist you in the future.   Screening recommendations/referrals: Colonoscopy: 03/13/15 Mammogram: 11/23/20 Bone Density: 05/18/14 Recommended yearly ophthalmology/optometry visit for glaucoma screening and checkup Recommended yearly dental visit for hygiene and checkup  Vaccinations: Influenza vaccine: 02/17/21 Pneumococcal vaccine: 08/05/14 Tdap vaccine: n/d Shingles vaccine: Shingrix  08/04/20, 12/13/20   Covid-19:09/25/19, 10/23/19, 04/23/20, 04/25/21  Advanced directives: no  Conditions/risks identified: no  Next appointment: Follow up in one year for your annual wellness visit    Preventive Care 75 Years and Older, Female Preventive care refers to lifestyle choices and visits with your health care provider that can promote health and wellness. What does preventive care include? A yearly physical exam. This is also called an annual well check. Dental exams once or twice a year. Routine eye exams. Ask your health care provider how often you should have your eyes checked. Personal lifestyle choices, including: Daily care of your teeth and gums. Regular physical activity. Eating a healthy diet. Avoiding tobacco and drug use. Limiting alcohol use. Practicing safe sex. Taking low-dose aspirin every day. Taking vitamin and mineral supplements as recommended by your health care provider. What happens during an annual well check? The services and screenings done by your health care provider during your annual well check will depend on your age, overall health, lifestyle risk factors, and family history of disease. Counseling  Your health care provider may ask you questions about your: Alcohol use. Tobacco use. Drug use. Emotional well-being. Home and relationship  well-being. Sexual activity. Eating habits. History of falls. Memory and ability to understand (cognition). Work and work Statistician. Reproductive health. Screening  You may have the following tests or measurements: Height, weight, and BMI. Blood pressure. Lipid and cholesterol levels. These may be checked every 5 years, or more frequently if you are over 50 years old. Skin check. Lung cancer screening. You may have this screening every year starting at age 75 if you have a 30-pack-year history of smoking and currently smoke or have quit within the past 15 years. Fecal occult blood test (FOBT) of the stool. You may have this test every year starting at age 75. Flexible sigmoidoscopy or colonoscopy. You may have a sigmoidoscopy every 5 years or a colonoscopy every 10 years starting at age 75. Hepatitis C blood test. Hepatitis B blood test. Sexually transmitted disease (STD) testing. Diabetes screening. This is done by checking your blood sugar (glucose) after you have not eaten for a while (fasting). You may have this done every 1-3 years. Bone density scan. This is done to screen for osteoporosis. You may have this done starting at age 75. Mammogram. This may be done every 1-2 years. Talk to your health care provider about how often you should have regular mammograms. Talk with your health care provider about your test results, treatment options, and if necessary, the need for more tests. Vaccines  Your health care provider may recommend certain vaccines, such as: Influenza vaccine. This is recommended every year. Tetanus, diphtheria, and acellular pertussis (Tdap, Td) vaccine. You may need a Td booster every 10 years. Zoster vaccine. You may need this after age 36. Pneumococcal 13-valent conjugate (PCV13) vaccine. One dose is recommended after age 16. Pneumococcal polysaccharide (PPSV23) vaccine. One dose is recommended after age 75. Talk to your health care provider  about which  screenings and vaccines you need and how often you need them. This information is not intended to replace advice given to you by your health care provider. Make sure you discuss any questions you have with your health care provider. Document Released: 05/19/2015 Document Revised: 01/10/2016 Document Reviewed: 02/21/2015 Elsevier Interactive Patient Education  2017 [employer] Prevention in the Home Falls can cause injuries. They can happen to people of all ages. There are many things you can do to make your home safe and to help prevent falls. What can I do on the outside of my home? Regularly fix the edges of walkways and driveways and fix any cracks. Remove anything that might make you trip as you walk through a door, such as a raised step or threshold. Trim any bushes or trees on the path to your home. Use bright outdoor lighting. Clear any walking paths of anything that might make someone trip, such as rocks or tools. Regularly check to see if handrails are loose or broken. Make sure that both sides of any steps have handrails. Any raised decks and porches should have guardrails on the edges. Have any leaves, snow, or ice cleared regularly. Use sand or salt on walking paths during winter. Clean up any spills in your garage right away. This includes oil or grease spills. What can I do in the bathroom? Use night lights. Install grab bars by the toilet and in the tub and shower. Do not use towel bars as grab bars. Use non-skid mats or decals in the tub or shower. If you need to sit down in the shower, use a plastic, non-slip stool. Keep the floor dry. Clean up any water that spills on the floor as soon as it happens. Remove soap buildup in the tub or shower regularly. Attach bath mats securely with double-sided non-slip rug tape. Do not have throw rugs and other things on the floor that can make you trip. What can I do in the bedroom? Use night lights. Make sure that you have a  light by your bed that is easy to reach. Do not use any sheets or blankets that are too big for your bed. They should not hang down onto the floor. Have a firm chair that has side arms. You can use this for support while you get dressed. Do not have throw rugs and other things on the floor that can make you trip. What can I do in the kitchen? Clean up any spills right away. Avoid walking on wet floors. Keep items that you use a lot in easy-to-reach places. If you need to reach something above you, use a strong step stool that has a grab bar. Keep electrical cords out of the way. Do not use floor polish or wax that makes floors slippery. If you must use wax, use non-skid floor wax. Do not have throw rugs and other things on the floor that can make you trip. What can I do with my stairs? Do not leave any items on the stairs. Make sure that there are handrails on both sides of the stairs and use them. Fix handrails that are broken or loose. Make sure that handrails are as long as the stairways. Check any carpeting to make sure that it is firmly attached to the stairs. Fix any carpet that is loose or worn. Avoid having throw rugs at the top or bottom of the stairs. If you do have throw rugs, attach them to the floor  with carpet tape. Make sure that you have a light switch at the top of the stairs and the bottom of the stairs. If you do not have them, ask someone to add them for you. What else can I do to help prevent falls? Wear shoes that: Do not have high heels. Have rubber bottoms. Are comfortable and fit you well. Are closed at the toe. Do not wear sandals. If you use a stepladder: Make sure that it is fully opened. Do not climb a closed stepladder. Make sure that both sides of the stepladder are locked into place. Ask someone to hold it for you, if possible. Clearly mark and make sure that you can see: Any grab bars or handrails. First and last steps. Where the edge of each step  is. Use tools that help you move around (mobility aids) if they are needed. These include: Canes. Walkers. Scooters. Crutches. Turn on the lights when you go into a dark area. Replace any light bulbs as soon as they burn out. Set up your furniture so you have a clear path. Avoid moving your furniture around. If any of your floors are uneven, fix them. If there are any pets around you, be aware of where they are. Review your medicines with your doctor. Some medicines can make you feel dizzy. This can increase your chance of falling. Ask your doctor what other things that you can do to help prevent falls. This information is not intended to replace advice given to you by your health care provider. Make sure you discuss any questions you have with your health care provider. Document Released: 02/16/2009 Document Revised: 09/28/2015 Document Reviewed: 05/27/2014 Elsevier Interactive Patient Education  2017 [employer].

## 2021-05-18 NOTE — Progress Notes (Signed)
Subjective:   Diana Harmon is a 76 y.o. female who presents for Medicare Annual (Subsequent) preventive examination.  Review of Systems           Objective:    Today's Vitals   05/18/21 1018  BP: (!) 148/70  Pulse: 87  Temp: 98 F (36.7 C)  TempSrc: Oral  SpO2: 98%  Weight: 232 lb 9.6 oz (105.5 kg)  Height: 5' (1.524 m)   Body mass index is 45.43 kg/m.  Advanced Directives 01/11/2021 07/13/2020 04/25/2020 11/10/2019 11/05/2019 11/04/2019 10/19/2019  Does Patient Have a Medical Advance Directive? Yes Yes Yes Yes Yes Yes Yes  Type of Paramedic of [place];Living will [place];Living will Healthcare Power of [place]  Does patient want to make changes to medical advance directive? - No - Patient declined - No - Patient declined - - No - Patient declined  Copy of [place] in Chart? No - copy requested Yes - validated most recent copy scanned in chart (See row information) Yes - validated most recent copy scanned in chart (See row information) No - copy requested Yes - validated most recent copy scanned in chart (See row information) - No - copy requested  Would patient like information on creating a medical advance directive? No - Patient declined No - Patient declined - - - - -    Current Medications (verified) Outpatient Encounter Medications as of 05/18/2021  Medication Sig   ACCU-CHEK GUIDE test strip Use to check blood sugar twice daily   Accu-Chek Softclix Lancets lancets Use to check blood sugar twice daily   acetaminophen (TYLENOL) 500 MG tablet Take 1,000 mg by mouth every 6 (six) hours as needed for moderate pain.    Artificial Tear Solution (GENTEAL TEARS) 0.1-0.2-0.3 % SOLN Place 1 drop into both eyes 3 (three) times daily as needed (dry/irritated eyes.).   Artificial Tear Solution (JUST  TEARS EYE DROPS) SOLN Apply to eye.   aspirin 81 MG chewable tablet Chew 1 tablet (81 mg total) by mouth daily.   B COMPLEX-C PO Take 10 mLs by mouth daily.   baclofen (LIORESAL) 10 MG tablet TAKE 1 TO 2 TABLETS BY MOUTH TWICE DAILY AS NEEDED FOR MUSCLE SPASM   Blood Glucose Monitoring Suppl (ACCU-CHEK GUIDE) w/Device KIT Use to check blood sugar twice daily   Blood Glucose Monitoring Suppl (GLUCOCOM BLOOD GLUCOSE MONITOR) DEVI Use to check blood sugar twice daily   Cholecalciferol (VITAMIN D3) 125 MCG (5000 UT) TABS Take 5,000 Units by mouth daily.   cyanocobalamin 1000 MCG tablet Take by mouth.   Ferrous Sulfate (IRON) 325 (65 Fe) MG TABS TAKE 1 TABLET BY MOUTH TWICE DAILY WITH A MEAL   ferrous sulfate 325 (65 FE) MG tablet Take 1 tablet by mouth 2 (two) times daily with a meal.   furosemide (LASIX) 20 MG tablet Take 1 tablet (20 mg total) by mouth daily as needed for edema.   hydrochlorothiazide (HYDRODIURIL) 25 MG tablet Take 1 tablet (25 mg total) by mouth daily. PLEASE SCHEDULE OFFICE VISIT FOR FURTHER REFILLS. THANK YOU!   hydrochlorothiazide (HYDRODIURIL) 25 MG tablet Take by mouth.   hydroxychloroquine (PLAQUENIL) 200 MG tablet Take 200 mg by mouth 2 (two) times daily.   hydroxychloroquine (PLAQUENIL) 200 MG tablet Take 1 tablet by mouth 2 (two) times daily.   Lancets Misc. KIT Use to check blood sugar  twice daily   levocetirizine (XYZAL) 5 MG tablet TAKE 1 TABLET BY MOUTH ONCE DAILY IN THE EVENING (Patient taking differently: Take 1/2 tablet (2.5 mg) daily in the evening as needed for allergies)   levocetirizine (XYZAL) 5 MG tablet Take 1 tablet by mouth every evening.   losartan (COZAAR) 50 MG tablet Take 1 tablet by mouth once daily   losartan (COZAAR) 50 MG tablet Take by mouth.   magnesium oxide (MAG-OX) 400 MG tablet Take 400 mg by mouth daily.   simvastatin (ZOCOR) 20 MG tablet TAKE 1 TABLET BY MOUTH ONCE DAILY AT  6  PM   TRULICITY 0.62 IR/4.8NI SOPN Inject 0.75 mg into the  skin once a week.   aspirin 81 MG EC tablet Take by mouth.   cephALEXin (KEFLEX) 500 MG capsule Take 4 tablets (2000 mg) by mouth 60 minutes prior to any dental procedures or cleanings (Patient not taking: Reported on 05/18/2021)   ibuprofen (ADVIL) 800 MG tablet Take by mouth. (Patient not taking: Reported on 05/18/2021)   lactobacillus acidophilus (BACID) TABS tablet Take 2 tablets by mouth 2 (two) times daily as needed (digestive health.). (Patient not taking: Reported on 05/18/2021)   Lactobacillus TABS Take by mouth. (Patient not taking: Reported on 05/18/2021)   SHINGRIX injection Inject 0.5 mL into muscle for shingles vaccine. Repeat dose in 2-6 months. (Patient not taking: Reported on 05/18/2021)   [DISCONTINUED] Cromolyn Sodium (NASAL ALLERGY NA) Place 1 spray into the nose daily as needed (allergies).   No facility-administered encounter medications on file as of 05/18/2021.    Allergies (verified) Ferumoxytol, Penicillins, Iron, and Other   History: Past Medical History:  Diagnosis Date   Anemia    Arthritis    legs, knees, back, wrists   Diabetes mellitus without complication (HCC)    Hypertension    Nausea    Patient is [name]    S/P TAVR (transcatheter aortic valve replacement) 11/09/2019   s/p TAVR with a 26 mm Edwards S3U via the TF approach by Dr. Angelena Form & Bartle   Severe aortic stenosis    SIRS (systemic inflammatory response syndrome) ([place]) 11/2019   Thyroid disease    Past Surgical History:  Procedure Laterality Date   CARDIAC CATHETERIZATION  10/19/2019   COLON SURGERY     caudarized polyps; rectal carcinoid excision ~ 2011   COLONOSCOPY     COLONOSCOPY WITH PROPOFOL N/A 03/13/2015   Procedure: COLONOSCOPY WITH PROPOFOL;  Surgeon: Manya Silvas, MD;  Location: [place];  Service: Endoscopy;  Laterality: N/A;   ESOPHAGOGASTRODUODENOSCOPY N/A 03/13/2015   Procedure: ESOPHAGOGASTRODUODENOSCOPY (EGD);  Surgeon: Manya Silvas, MD;  Location:  [facility] ENDOSCOPY;  Service: Endoscopy;  Laterality: N/A;   INTRAOPERATIVE TRANSTHORACIC ECHOCARDIOGRAM N/A 11/09/2019   Procedure: INTRAOPERATIVE TRANSTHORACIC ECHOCARDIOGRAM;  Surgeon: Burnell Blanks, MD;  Location: [place];  Service: Open Heart Surgery;  Laterality: N/A;   PARATHYROIDECTOMY  11/20/2004   right superior parathyroidecomy for primary hyperparathyroidism/parathyroid adenoma   RIGHT/LEFT HEART CATH AND CORONARY ANGIOGRAPHY N/A 10/19/2019   Procedure: RIGHT/LEFT HEART CATH AND CORONARY ANGIOGRAPHY;  Surgeon: Nelva Bush, MD;  Location: [place] CV LAB;  Service: Cardiovascular;  Laterality: N/A;   THYROIDECTOMY     TRANSCATHETER AORTIC VALVE REPLACEMENT, TRANSFEMORAL N/A 11/09/2019   Procedure: TRANSCATHETER AORTIC VALVE REPLACEMENT, TRANSFEMORAL;  Surgeon: Burnell Blanks, MD;  Location: [place];  Service: Open Heart Surgery;  Laterality: N/A;   Family History  Problem Relation Age of Onset   Parkinson's disease Mother  Heart disease Father    Lupus Sister    Aneurysm Son    Lupus Son    Breast cancer Maternal Aunt        mat great aunt   Social History   Socioeconomic History   Marital status: Divorced    Spouse name: Not on file   Number of children: 2   Years of education: 12   Highest education level: 12th grade  Occupational History   Occupation: retired-worked with nandicapped adults  Tobacco Use   Smoking status: Former    Years: 20.00    Types: Cigarettes    Quit date: 09/05/1979    Years since quitting: 41.7   Smokeless tobacco: Former  Scientific laboratory technician Use: Never used  Substance and Sexual Activity   Alcohol use: Yes    Alcohol/week: 0.0 - 1.0 standard drinks    Comment: occ. wine   Drug use: No   Sexual activity: Not on file  Other Topics Concern   Not on file  Social History Narrative   Not on file   Social Determinants of Health   Financial Resource Strain: Low Risk    Difficulty of Paying Living Expenses: Not hard at  all  Food Insecurity: No Food Insecurity   Worried About Charity fundraiser in the Last Year: Never true   [place] in the Last Year: Never true  Transportation Needs: No Transportation Needs   Lack of Transportation (Medical): No   Lack of Transportation (Non-Medical): No  Physical Activity: Insufficiently Active   Days of Exercise per Week: 2 days   Minutes of Exercise per Session: 40 min  Stress: No Stress Concern Present   Feeling of Stress : Not at all  Social Connections: Socially Isolated   Frequency of Communication with Friends and Family: Twice a week   Frequency of Social Gatherings with Friends and Family: Never   Attends Religious Services: More than 4 times per year   Active Member of [employer] or Organizations: No   Attends Music therapist: Never   Marital Status: Divorced    Tobacco Counseling Counseling given: Not Answered   Clinical Intake:  Pre-visit preparation completed: Yes  Pain : No/denies pain     Nutritional Status: BMI > 30  Obese Nutritional Risks: None Diabetes: Yes CBG done?: No Did pt. bring in CBG monitor from home?: No  How often do you need to have someone help you when you read instructions, pamphlets, or other written materials from your doctor or pharmacy?: 1 - Never  Diabetic?yes Nutrition Risk Assessment:  Has the patient had any N/V/D within the last 2 months?  No  Does the patient have any non-healing wounds?  No  Has the patient had any unintentional weight loss or weight gain?  No   Diabetes:  Is the patient diabetic?  Yes  If diabetic, was a CBG obtained today?  No  Did the patient bring in their glucometer from home?  No  How often do you monitor your CBG's? Every now and then, mostly at MD's office- keeps journal.   Financial Strains and Diabetes Management:  Are you having any financial strains with the device, your supplies or your medication? No .  Does the patient want to be seen by Chronic  Care Management for management of their diabetes?  No  Would the patient like to be referred to a Nutritionist or for Diabetic Management?  No   Diabetic Exams:  Diabetic Eye Exam: Completed 04/19/21.  Diabetic Foot Exam: Completed 04/25/21. Pt has been advised about the importance in completing this exam.   Interpreter Needed?: No  Information entered by :: Kirke Shaggy, LPN   Activities of Daily Living No flowsheet data found.  Patient Care Team: [name], DO as PCP - General (Family Medicine) End, Harrell Gave, MD as PCP - Cardiology (Cardiology) Cammie Sickle, MD as Consulting Physician (Internal Medicine) Lorelee Cover., MD (Ophthalmology) End, Harrell Gave, MD as Consulting Physician (Cardiology) Vanita Ingles, RN as Case Manager ([place]) Cammie Sickle, MD as Consulting Physician (Internal Medicine) Curley Spice, Virl [name], RPH-CPP as Pharmacist  Indicate any recent Medical Services you may have received from other than [facility] providers in the past year (date may be approximate).     Assessment:   This is a routine wellness examination for Kayzlee.  Hearing/Vision screen No results found.  Dietary issues and exercise activities discussed:     Goals Addressed             This Visit's Progress    DIET - REDUCE SALT INTAKE TO 2 GRAMS PER DAY OR LESS       Drink more water       Depression Screen PHQ 2/9 Scores 05/18/2021 11/23/2020 04/25/2020 03/10/2020 09/14/2019 09/13/2019 04/20/2019  PHQ - 2 Score 0 1 0 0 0 0 0  PHQ- 9 Score - 3 - - - - -    Fall Risk Fall Risk  04/25/2020 03/10/2020 03/10/2020 09/14/2019 04/20/2019  Falls in the past year? 0 0 0 0 0  Number falls in past yr: - 0 0 0 0  Injury with Fall? - 0 0 0 0  Risk for fall due to : Medication side effect - - - -  Risk for fall due to: Comment - - - - -  Follow up Falls evaluation completed;Education provided;Falls prevention discussed Falls evaluation completed  Falls evaluation completed Falls evaluation completed -    FALL RISK PREVENTION PERTAINING TO THE HOME:  Any stairs in or around the home? No  If so, are there any without handrails? No  Home free of loose throw rugs in walkways, pet beds, electrical cords, etc? Yes  Adequate lighting in your home to reduce risk of falls? Yes   ASSISTIVE DEVICES UTILIZED TO PREVENT FALLS:  Life alert? No  Use of a cane, walker or w/c? Yes  Grab bars in the bathroom? No  Shower chair or bench in shower? Yes  Elevated toilet seat or a handicapped toilet? Yes   TIMED UP AND GO:  Was the test performed? Yes .  Length of time to ambulate 10 feet: 7 sec.   Gait slow and steady with assistive device  Cognitive Function:     6CIT Screen 04/25/2020 04/14/2018 04/08/2017  What Year? 0 points 0 points 0 points  What month? 0 points 0 points 0 points  What time? 0 points 0 points 0 points  Count back from 20 0 points 0 points 0 points  Months in reverse 0 points 0 points 0 points  Repeat phrase 0 points 6 points 0 points  Total Score 0 6 0    Immunizations Immunization History  Administered Date(s) Administered   Fluad Quad(high Dose 65+) 01/12/2019, 02/11/2020   Influenza, High Dose Seasonal PF 02/09/2014, 01/05/2015, 01/30/2016, 01/14/2017, 02/17/2021   Influenza,inj,Quad PF,6+ Mos 04/05/2013   Influenza-Unspecified 02/03/2014, 02/03/2018   Moderna Sars-Covid-2 Vaccination 09/25/2019, 10/23/2019, 04/23/2020   Pneumococcal Conjugate-13 08/05/2014  Pneumococcal Polysaccharide-23 04/05/2013   Zoster Recombinat (Shingrix) 08/04/2020, 12/13/2020    TDAP status: Due, Education has been provided regarding the importance of this vaccine. Advised may receive this vaccine at local pharmacy or Health Dept. Aware to provide a copy of the vaccination record if obtained from local pharmacy or Health Dept. Verbalized acceptance and understanding.  Flu Vaccine status: Up to date  Pneumococcal vaccine  status: Up to date  Covid-19 vaccine status: Completed vaccines  Qualifies for Shingles Vaccine? Yes   Zostavax completed No   Shingrix Completed?: Yes  Screening Tests Health Maintenance  Topic Date Due   COVID-19 Vaccine (4 - Booster for Moderna series) 06/18/2020   HEMOGLOBIN A1C  10/19/2021   OPHTHALMOLOGY EXAM  04/19/2022   FOOT EXAM  04/25/2022   MAMMOGRAM  11/24/2022   DEXA SCAN  05/18/2024   TETANUS/TDAP  12/04/2024   COLONOSCOPY (Pts 45-1yr Insurance coverage will need to be confirmed)  03/12/2025   Pneumonia Vaccine 75 Years old  Completed   INFLUENZA VACCINE  Completed   Hepatitis C Screening  Completed   Zoster Vaccines- Shingrix  Completed   HPV VACCINES  Aged Out    Health Maintenance  Health Maintenance Due  Topic Date Due   COVID-19 Vaccine (4 - Booster for Moderna series) 06/18/2020    Colorectal cancer screening: Type of screening: Colonoscopy. Completed 03/13/15. Repeat every 10 years  Mammogram status: Completed 11/23/20. Repeat every year  Bone Density status: Completed 05/18/14. Results reflect: Bone density results: NORMAL. Repeat every 5 years.- declined  Lung Cancer Screening: (Low Dose CT Chest recommended if Age 247-80years, 30 pack-year currently smoking OR have quit w/in 15years.) does not qualify.    Additional Screening:  Hepatitis C Screening: does qualify; Completed 07/21/17  Vision Screening: Recommended annual ophthalmology exams for early detection of glaucoma and other disorders of the eye. Is the patient up to date with their annual eye exam?  Yes  Who is the provider or what is the name of the office in which the patient attends annual eye exams? Dr. BGloriann LoanIf pt is not established with a provider, would they like to be referred to a provider to establish care? No .   Dental Screening: Recommended annual dental exams for proper oral hygiene  Community Resource Referral / Chronic Care Management: CRR required this visit?  No    CCM required this visit?  No      Plan:     I have personally reviewed and noted the following in the patients chart:   Medical and social history Use of alcohol, tobacco or illicit drugs  Current medications and supplements including opioid prescriptions.  Functional ability and status Nutritional status Physical activity Advanced directives List of other physicians Hospitalizations, surgeries, and ER visits in previous 12 months Vitals Screenings to include cognitive, depression, and falls Referrals and appointments  In addition, I have reviewed and discussed with patient certain preventive protocols, quality metrics, and best practice recommendations. A written personalized care plan for preventive services as well as general preventive health recommendations were provided to patient.     LDionisio David LPN   14/13/2440  Nurse Notes: none

## 2021-05-30 ENCOUNTER — Ambulatory Visit (INDEPENDENT_AMBULATORY_CARE_PROVIDER_SITE_OTHER): Payer: Medicare Other | Admitting: Pharmacist

## 2021-05-30 DIAGNOSIS — I10 Essential (primary) hypertension: Secondary | ICD-10-CM

## 2021-05-30 DIAGNOSIS — E1169 Type 2 diabetes mellitus with other specified complication: Secondary | ICD-10-CM

## 2021-05-30 DIAGNOSIS — E785 Hyperlipidemia, unspecified: Secondary | ICD-10-CM

## 2021-05-30 NOTE — Patient Instructions (Signed)
Visit Information  Thank you for taking time to visit with me today. Please don't hesitate to contact me if I can be of assistance to you before our next scheduled telephone appointment.  Following are the goals we discussed today:   Goals Addressed             This Visit's Progress    Pharmacy Goals       Our goal A1c is less than 7%. This corresponds with fasting sugars less than 130 and 2 hour after meal sugars less than 180. Please keep a log of your results when checking your blood sugar  Our goal bad cholesterol, or LDL, is less than 70 . This is why it is important to continue taking your simvastatin  Please check your home blood pressure, keep a log of the results and bring this with you to your medical appointments.  Feel free to call me with any questions or concerns. I look forward to our next call!  Wallace Cullens, PharmD, Para [place], CPP Clinical Pharmacist [facility] [phone]          Our next appointment is by telephone on 06/20/2021 at 10:00 AM  Please call the care guide team at [phone] if you need to cancel or reschedule your appointment.    Patient verbalizes understanding of instructions and care plan provided today and agrees to view in [place]. Active MyChart status confirmed with patient.

## 2021-05-30 NOTE — Chronic Care Management (AMB) (Signed)
Chronic Care Management CCM Pharmacy Note  05/30/2021 Name:  Diana Harmon MRN:  [id number] DOB:  Sep 23, 1945   Subjective: Diana Harmon is an 76 y.o. year old female who is a primary patient of [name], DO.  The CCM team was consulted for assistance with disease management and care coordination needs.    Engaged with patient by telephone for follow up visit for pharmacy case management and/or care coordination services.   Objective:  Medications Reviewed Today     Reviewed by Rennis Petty, RPH-CPP (Pharmacist) on 05/30/21 at 1106  Med List Status: <None>   Medication Order Taking? Sig Documenting Provider Last Dose Status Informant  ACCU-CHEK GUIDE test strip [id number]  Use to check blood sugar twice daily [name], DO  Active   Accu-Chek Softclix Lancets lancets [id number]  Use to check blood sugar twice daily [name], DO  Active   acetaminophen (TYLENOL) 500 MG tablet [id number] Yes Take 1,000 mg by mouth every 6 (six) hours as needed for moderate pain.  [provider] Taking Active Child  Artificial Tear Solution (GENTEAL TEARS) 0.1-0.2-0.3 % SOLN [id number]  Place 1 drop into both eyes 3 (three) times daily as needed (dry/irritated eyes.). [provider]  Active Child  Artificial Tear Solution (JUST TEARS EYE DROPS) Bailey Mech [id number]  Apply to eye. [provider]  Active   aspirin 81 MG chewable tablet [id number] Yes Chew 1 tablet (81 mg total) by mouth daily. Eileen Stanford, PA-C Taking Active Child  B COMPLEX-C PO [id number]  Take 10 mLs by mouth daily. [provider]  Active Child  baclofen (LIORESAL) 10 MG tablet [id number] Yes TAKE 1 TO 2 TABLETS BY MOUTH TWICE DAILY AS NEEDED FOR MUSCLE SPASM [place] Ranger, Devonne Doughty, DO Taking Active   Blood Glucose Monitoring Suppl (ACCU-CHEK GUIDE) w/Device KIT [id number]  Use to check blood sugar twice daily [name], Devonne Doughty, DO  Active   Blood Glucose Monitoring Suppl (GLUCOCOM BLOOD GLUCOSE MONITOR) DEVI [id number]  Use to check blood sugar twice daily [provider]  Active   cephALEXin (KEFLEX) 500 MG capsule [id number]  Take 4 tablets (2000 mg) by mouth 60 minutes prior to any dental procedures or cleanings  Patient not taking: Reported on 05/18/2021   [provider]  Active   Cholecalciferol (VITAMIN D3) 125 MCG (5000 UT) TABS [id number] Yes Take 5,000 Units by mouth daily. [provider] Taking Active Child    Discontinued 09/05/19 1316   cyanocobalamin 1000 MCG tablet [id number] Yes Take by mouth. [provider] Taking Active   Ferrous Sulfate (IRON) 325 (65 Fe) MG TABS [id number] Yes TAKE 1 TABLET BY MOUTH TWICE DAILY WITH A MEAL [name], Devonne Doughty, DO Taking Active Child  furosemide (LASIX) 20 MG tablet [id number] Yes Take 1 tablet (20 mg total) by mouth daily as needed for edema. End, Harrell Gave, MD Taking Active Child  hydrochlorothiazide (HYDRODIURIL) 25 MG tablet [id number] Yes Take 1 tablet (25 mg total) by mouth daily. PLEASE SCHEDULE OFFICE VISIT FOR FURTHER REFILLS. THANK YOU! End, Harrell Gave, MD Taking Active   hydroxychloroquine (PLAQUENIL) 200 MG tablet [id number] Yes Take 1 tablet by mouth 2 (two) times daily. [provider] Taking Active   lactobacillus acidophilus (BACID) TABS tablet [id number] Yes Take 1 tablet by mouth daily as needed (digestive health.). [provider] Taking Active            Med Note Vianne Bulls Nov 03, 2019  3:06 PM)    Lancets Misc. KIT [id number]  Use to check blood sugar twice daily [provider]  Active   levocetirizine (XYZAL) 5 MG tablet [id number] Yes TAKE 1 TABLET BY MOUTH ONCE DAILY IN THE EVENING  Patient taking differently: Take 1/2 tablet (2.5 mg) daily in the evening as needed for allergies   [name], DO Taking Active   losartan (COZAAR) 50 MG tablet [id number]  Yes Take 1 tablet by mouth once daily End, Christopher, MD Taking Active   magnesium oxide (MAG-OX) 400 MG tablet [id number] Yes Take 400 mg by mouth daily. [provider] Taking Active   simvastatin (ZOCOR) 20 MG tablet [id number] Yes TAKE 1 TABLET BY MOUTH ONCE DAILY AT  6  PM [name], Devonne Doughty, DO Taking Active   TRULICITY 6.37 CH/8.8FO SOPN [id number] Yes Inject 0.75 mg into the skin once a week. [name], DO Taking Active             Pertinent Labs:  Lab Results  Component Value Date   HGBA1C 6.7 (H) 04/20/2021   Lab Results  Component Value Date   CHOL 155 04/20/2021   HDL 65 04/20/2021   LDLCALC 72 04/20/2021   TRIG 95 04/20/2021   CHOLHDL 2.4 04/20/2021   Lab Results  Component Value Date   CREATININE 1.18 (H) 04/20/2021   BUN 28 (H) 04/20/2021   NA 142 04/20/2021   K 4.0 04/20/2021   CL 106 04/20/2021   CO2 29 04/20/2021    SDOH:  (Social Determinants of Health) assessments and interventions performed:    [place]  Review of patient past medical history, allergies, medications, health status, including review of consultants reports, laboratory and other test data, was performed as part of comprehensive evaluation and provision of chronic care management services.   Care Plan : PharmD - Medication Assistance/Management  Updates made by Rennis Petty, RPH-CPP since 05/30/2021 12:00 AM     Problem: Disease Progression      Long-Range Goal: Disease Progression Prevented or Minimized   Start Date: 10/30/2020  Expected End Date: 01/28/2021  This Visit's Progress: On track  Recent Progress: On track  Priority: High  Note:   Current Barriers:  Unable to independently afford treatment regimen Patient APPROVED for [facility] Patient Assistance Program for Trulicity, eligible from 11/21/20-05/05/22  Pharmacist Clinical Goal(s):  Over the next 90 days, patient will verbalize ability to afford treatment regimen through  collaboration with PharmD and provider.   Interventions: 1:1 collaboration with [name], DO regarding development and update of comprehensive plan of care as evidenced by provider attestation and co-signature Inter-disciplinary care team collaboration (see longitudinal plan of care) Receive a message from CCM Nurse Case Manager requesting call to patient regarding questions about patient assistance program Perform chart review. Patient seen for Office Visit with PCP on 12/21 for annual physical Comprehensive medication review performed; medication list updated in electronic medical record Patient using weekly pillbox to organize her medications; denies missed doses  T2DM: Control improved based on latest A1C reading; current treatment: Trulicity 6.76 mg weekly Previous therapies: metformin Reports recent home blood sugar readings:  Morning Fasting Before Supper After Supper  19 - January 116  127  20 - January 135    21 - January 119 107   22 - January - - -  23 - January -  154  24 - January 122    25 - January -  Reported working on making positive dietary changes, including: °Drinking more water and having more non-starchy vegetables  °Baking chicken and eating at home more °Noticing impact of dietary choices on blood sugar °Cholesterol management: simvastatin 20 mg daily °Exercise °Reports exercise limited by arthritis.  °Previously went to the pool, but stopped going due to COVID-19 pandemic °Reports uses upright exercise bike, exercise from PT and/or yoga at home for 30 minutes x 3 days/week °Encourage patient to continue to monitor home blood sugar, keep log and bring record with her to upcoming appointment with PCP °  °HTN: °Current treatment: °HCTZ 25 mg daily °Losartan 50 mg daily °Furosemide 20 mg daily as needed for edema °Reports recent home blood pressure readings: ° AM BP PM BP  °19 - January 116/46, HR 69 108/49, HR 79  °20 - January 134/51, HR 75 154/74, HR  86  °21 - January 135/59, HR 75 139/56, HR 85  °22 - January 138/65, HR 88 150/72, HR -  °23 - January - 132/60, HR 85  °24 - January - 159/73, HR 80  °25 - January -   °Notes some elevated evening readings may be related to arthritis knee pain °Patient obtained and started using upper arm blood pressure monitor 2 weeks ago for greater accuracy °Counsel on blood pressure monitoring technique, particularly on resting prior to readings °Counsel on impact of salt/sodium in diet on blood pressure. Encourage patient to limit sodium intake °Encouraged patient to monitor daily weights and follow direction from Cardiology for taking furosemide as needed  °Encourage patient to bring record of daily weights and home BP readings with her to medical appointments °  °Medication Assistance: °Reports has sufficient supply of Trulicity to last until end of calendar year °Continue to collaborate with [facility] Jill Simcox to aid patient in reapply to [employer] patient assistance program for 2023 calendar year °From review of chart, note [facility] Jill Simcox contacted [employer] in regard to patient's Trulicity re-enrollment application on 04/26/2021 and was advised patient APPROVED 05/06/21-05/05/22 and medication would begin to ship in January based on last fill date in 2022, to be delivered to patient's home °Reports has a couple of weeks of Trulicity remaining.  °Reports has called assistance program regarding refill and should be on its way °Encourage patient to call to follow up if not received by next week ° ° °Patient Goals/Self-Care Activities °Over the next 90 days, patient will:  °- take medications as prescribed °- check glucose, document, and provide at future appointments °- check blood pressure, document, and provide at future appointments °- collaborate with provider on medication access solutions °- attend medical appointments as scheduled ° °Follow Up Plan: Telephone follow up appointment with care management team member  scheduled for: 06/20/2021 at 10 am ° °  °  ° °Elisabeth Delles, PharmD, BCACP, CPP °Clinical Pharmacist °[facility] °[place] °[phone] ° ° ° ° ° °

## 2021-06-01 ENCOUNTER — Other Ambulatory Visit: Payer: Self-pay | Admitting: Internal Medicine

## 2021-06-01 DIAGNOSIS — I10 Essential (primary) hypertension: Secondary | ICD-10-CM

## 2021-06-01 MED ORDER — LOSARTAN POTASSIUM 50 MG PO TABS: 50.0000 mg | ORAL_TABLET | Freq: Every day | ORAL | 0 refills | Status: DC

## 2021-06-01 MED ORDER — HYDROCHLOROTHIAZIDE 25 MG PO TABS: 25.0000 mg | ORAL_TABLET | Freq: Every day | ORAL | 0 refills | Status: DC

## 2021-06-01 NOTE — Telephone Encounter (Signed)
*  STAT* If patient is at the pharmacy, call can be transferred to refill team.   1. Which medications need to be refilled? (please list name of each medication and dose if known) Losartan, Hydrochlorothiazide  2. Which pharmacy/location (including street and city if local pharmacy) is medication to be sent to? [facility] [place]  3. Do they need a 30 day or 90 day supply? [place]

## 2021-06-05 DIAGNOSIS — E785 Hyperlipidemia, unspecified: Secondary | ICD-10-CM | POA: Diagnosis not present

## 2021-06-05 DIAGNOSIS — E1169 Type 2 diabetes mellitus with other specified complication: Secondary | ICD-10-CM

## 2021-06-05 DIAGNOSIS — I10 Essential (primary) hypertension: Secondary | ICD-10-CM

## 2021-06-05 NOTE — Progress Notes (Signed)
Cardiology Office Note:    Date:  06/06/2021   ID:  Diana Harmon, DOB 02/19/1946, MRN [id number]  PCP:  [name], DO  [facility] Cardiologist:  Nelva Bush, MD  [facility] Electrophysiologist:  None   Referring MD: [name] *   Chief Complaint: 6 month follow-up  History of Present Illness:    Diana Harmon is a 76 y.o. female with a hx of chronic systolic CHF, RBBB, obesity, HTN, HLD, diabetes mellitus, thyroid disease, anemia on chronic iron infusions, [name], and severe aortic stenosis s/p TAVR (11/09/19) who presents to clinic for follow up.    Pt has a history of aortic stenosis that progressed to severe last year. Echocardiogram on 08/19/2019 showed an increase in the mean gradient to 32 mmHg with a peak gradient of 75 mmHg.  Aortic valve area had decreased to 0.85 cm consistent with severe aortic stenosis.  Left ventricular ejection fraction is 60 to 65%.  She underwent cardiac catheterization on 10/19/2019 showing no evidence of coronary disease.  The mean gradient by catheterization was 29 mmHg with an aortic valve area of 0.8 cm.   She was evaluated by the multidisciplinary valve team and underwent successful TAVR with a 26 mm Edwards Sapien 3 Ultra THV via the TF approach on 11/09/19. Post operative echo showed EF 65%, normally functioning TAVR with a mean gradient of 18 mm hg and no PVL. She was discharged on aspirin and palvix.    She was then readmitted from 7/7-7/10/21 for fever with SIRS. Primary source unknown. Work up was negative and she was treated for a UTI. 1 mont echo showed EF 65%, normally functioning TAVR with a mean gradient of 13 mmHg (improved from 18 mm hg) and no PVL.  Last seen by structural team 11/09/20 and reported occasional LLE on PRN lasix. Echo showed 60%, normally functioning TAVR with a mean gradient of 14 mm hg and no PVL.  Today, the patient reports occasional swelling in her feet.  She takes lasix PRN. She took lasix  1 week ago for 3 days and this improved swelling, swelling occurred due to increased salt intake. Generally she very rarely has to take lasix. No chest pain or SOB. Patient uses a walker. She lives with her daughter. Can perform all ADLs. No orthopnea, pnd, palpitations. She sees hematology every 4-6 months.   Past Medical History:  Diagnosis Date   Anemia    Arthritis    legs, knees, back, wrists   Diabetes mellitus without complication (HCC)    Hypertension    Nausea    Patient is [name]    S/P TAVR (transcatheter aortic valve replacement) 11/09/2019   s/p TAVR with a 26 mm Edwards S3U via the TF approach by Dr. Angelena Form & Bartle   Severe aortic stenosis    SIRS (systemic inflammatory response syndrome) ([place]) 11/2019   Thyroid disease     Past Surgical History:  Procedure Laterality Date   CARDIAC CATHETERIZATION  10/19/2019   COLON SURGERY     caudarized polyps; rectal carcinoid excision ~ 2011   COLONOSCOPY     COLONOSCOPY WITH PROPOFOL N/A 03/13/2015   Procedure: COLONOSCOPY WITH PROPOFOL;  Surgeon: Manya Silvas, MD;  Location: [place];  Service: Endoscopy;  Laterality: N/A;   ESOPHAGOGASTRODUODENOSCOPY N/A 03/13/2015   Procedure: ESOPHAGOGASTRODUODENOSCOPY (EGD);  Surgeon: Manya Silvas, MD;  Location: [facility] ENDOSCOPY;  Service: Endoscopy;  Laterality: N/A;   INTRAOPERATIVE TRANSTHORACIC ECHOCARDIOGRAM N/A 11/09/2019   Procedure:  INTRAOPERATIVE TRANSTHORACIC ECHOCARDIOGRAM;  Surgeon: Burnell Blanks, MD;  Location: [place];  Service: Open Heart Surgery;  Laterality: N/A;   PARATHYROIDECTOMY  11/20/2004   right superior parathyroidecomy for primary hyperparathyroidism/parathyroid adenoma   RIGHT/LEFT HEART CATH AND CORONARY ANGIOGRAPHY N/A 10/19/2019   Procedure: RIGHT/LEFT HEART CATH AND CORONARY ANGIOGRAPHY;  Surgeon: Nelva Bush, MD;  Location: [place] [facility];  Service: Cardiovascular;  Laterality: N/A;    THYROIDECTOMY     TRANSCATHETER AORTIC VALVE REPLACEMENT, TRANSFEMORAL N/A 11/09/2019   Procedure: TRANSCATHETER AORTIC VALVE REPLACEMENT, TRANSFEMORAL;  Surgeon: Burnell Blanks, MD;  Location: [place];  Service: Open Heart Surgery;  Laterality: N/A;    Current Medications: Current Meds  Medication Sig   ACCU-CHEK GUIDE test strip Use to check blood sugar twice daily   Accu-Chek Softclix Lancets lancets Use to check blood sugar twice daily   acetaminophen (TYLENOL) 500 MG tablet Take 1,000 mg by mouth every 6 (six) hours as needed for moderate pain.    Artificial Tear Solution (GENTEAL TEARS) 0.1-0.2-0.3 % SOLN Place 1 drop into both eyes 3 (three) times daily as needed (dry/irritated eyes.).   aspirin 81 MG chewable tablet Chew 1 tablet (81 mg total) by mouth daily.   B COMPLEX-C PO Take 10 mLs by mouth daily.   baclofen (LIORESAL) 10 MG tablet TAKE 1 TO 2 TABLETS BY MOUTH TWICE DAILY AS NEEDED FOR MUSCLE SPASM   Blood Glucose Monitoring Suppl (ACCU-CHEK GUIDE) w/Device KIT Use to check blood sugar twice daily   Blood Glucose Monitoring Suppl (GLUCOCOM BLOOD GLUCOSE MONITOR) DEVI Use to check blood sugar twice daily   cephALEXin (KEFLEX) 500 MG capsule    Cholecalciferol (VITAMIN D3) 125 MCG (5000 UT) TABS Take 5,000 Units by mouth daily.   cyanocobalamin 1000 MCG tablet Take by mouth.   Ferrous Sulfate (IRON) 325 (65 Fe) MG TABS TAKE 1 TABLET BY MOUTH TWICE DAILY WITH A MEAL   furosemide (LASIX) 20 MG tablet Take 1 tablet (20 mg total) by mouth daily as needed for edema.   hydroxychloroquine (PLAQUENIL) 200 MG tablet Take 1 tablet by mouth 2 (two) times daily.   lactobacillus acidophilus (BACID) TABS tablet Take 1 tablet by mouth daily as needed (digestive health.).   Lancets Misc. KIT Use to check blood sugar twice daily   levocetirizine (XYZAL) 5 MG tablet TAKE 1 TABLET BY MOUTH ONCE DAILY IN THE EVENING (Patient taking differently: Take 1/2 tablet (2.5 mg) daily in the evening  as needed for allergies)   magnesium oxide (MAG-OX) 400 MG tablet Take 400 mg by mouth daily.   simvastatin (ZOCOR) 20 MG tablet TAKE 1 TABLET BY MOUTH ONCE DAILY AT  6  PM   TRULICITY 8.01 KP/5.3ZS SOPN Inject 0.75 mg into the skin once a week.   [DISCONTINUED] hydrochlorothiazide (HYDRODIURIL) 25 MG tablet Take 1 tablet (25 mg total) by mouth daily.   [DISCONTINUED] losartan (COZAAR) 50 MG tablet Take 1 tablet (50 mg total) by mouth daily.     Allergies:   Ferumoxytol, Penicillins, Iron, and Other   Social History   Socioeconomic History   Marital status: Divorced    Spouse name: Not on file   Number of children: 2   Years of education: 12   Highest education level: 12th grade  Occupational History   Occupation: retired-worked with nandicapped adults  Tobacco Use   Smoking status: Former    Years: 20.00    Types: Cigarettes    Quit date: 09/05/1979  Years since quitting: 41.7   Smokeless tobacco: Former  Scientific laboratory technician Use: Never used  Substance and Sexual Activity   Alcohol use: Yes    Alcohol/week: 0.0 - 1.0 standard drinks    Comment: occ. wine   Drug use: No   Sexual activity: Not on file  Other Topics Concern   Not on file  Social History Narrative   Not on file   Social Determinants of Health   Financial Resource Strain: Low Risk    Difficulty of Paying Living Expenses: Not hard at all  Food Insecurity: No Food Insecurity   Worried About Charity fundraiser in the Last Year: Never true   [place] in the Last Year: Never true  Transportation Needs: No Transportation Needs   Lack of Transportation (Medical): No   Lack of Transportation (Non-Medical): No  Physical Activity: Insufficiently Active   Days of Exercise per Week: 2 days   Minutes of Exercise per Session: 40 min  Stress: No Stress Concern Present   Feeling of Stress : Not at all  Social Connections: Socially Isolated   Frequency of Communication with Friends and Family: Twice a week    Frequency of Social Gatherings with Friends and Family: Never   Attends Religious Services: More than 4 times per year   Active Member of [employer] or Organizations: No   Attends Archivist Meetings: Never   Marital Status: Divorced     Family History: The patient's family history includes Aneurysm in her son; Breast cancer in her maternal aunt; Heart disease in her father; Lupus in her sister and son; Parkinson's disease in her mother.  ROS:   Please see the history of present illness.     All other systems reviewed and are negative.  EKGs/Labs/Other Studies Reviewed:    The following studies were reviewed today:  Echo 11/09/20  1. 26 mm S3 (11/09/2019). V max 2.5 m/s, MG 14 mmHG, EOA 1.88 cm2, DI 0.45.  No regurgitation or PVL. Normal prosthesis. The aortic valve has been  repaired/replaced. Aortic valve regurgitation is not visualized. There is  a 26 mm Sapien prosthetic (TAVR)  valve present in the aortic position. Procedure Date: 11/09/2019. Echo  findings are consistent with normal structure and function of the aortic  valve prosthesis.   2. Left ventricular ejection fraction, by estimation, is 60 to 65%. The  left ventricle has normal function. The left ventricle has no regional  wall motion abnormalities. There is mild asymmetric left ventricular  hypertrophy of the basal-septal segment.  Left ventricular diastolic parameters are consistent with Grade I  diastolic dysfunction (impaired relaxation).   3. Right ventricular systolic function is normal. The right ventricular  size is normal. There is normal pulmonary artery systolic pressure. The  estimated right ventricular systolic pressure is 60.6 mmHg.   4. The mitral valve is grossly normal. No evidence of mitral valve  regurgitation. No evidence of mitral stenosis.   5. The inferior vena cava is normal in size with greater than 50%  respiratory variability, suggesting right atrial pressure of 3 mmHg.   Echo  12/20/19 IMPRESSIONS   1. Left ventricular ejection fraction, by estimation, is 65 to 70%. The  left ventricle has normal function. The left ventricle has no regional  wall motion abnormalities. There is moderate left ventricular hypertrophy.  Left ventricular diastolic  parameters are consistent with Grade I diastolic dysfunction (impaired  relaxation).   2. Right ventricular systolic function is  normal. The right ventricular  size is normal. There is normal pulmonary artery systolic pressure.   3. The mitral valve is abnormal. Trivial mitral valve regurgitation.   4. The aortic valve has been repaired/replaced. Aortic valve  regurgitation is not visualized. There is a 26 mm Edwards Edwards Sapien  prosthetic (TAVR) valve present in the aortic position. Procedure Date:  11/09/19. Echo findings are consistent with  normal structure and function of the aortic valve prosthesis. Aortic valve  area, by VTI measures 1.41 cm. Aortic valve mean gradient measures 13.0  mmHg. Aortic valve Vmax measures 2.42 m/s.   5. The inferior vena cava is normal in size with greater than 50%  respiratory variability, suggesting right atrial pressure of 3 mmHg.   Comparison(s): Changes from prior study are noted. 11/10/19: 65-70%, AOV  mean gradient 18 mmHg.     ____________________   Echo 11/09/20 IMPRESSIONS   1. 26 mm S3 (11/09/2019). V max 2.5 m/s, MG 14 mmHG, EOA 1.88 cm2, DI 0.45. No regurgitation or PVL. Normal prosthesis. The aortic valve has been repaired/replaced. Aortic valve regurgitation is not visualized. There is  a 26 mm Sapien prosthetic (TAVR)  valve present in the aortic position. Procedure Date: 11/09/2019. Echo  findings are consistent with normal structure and function of the aortic  valve prosthesis.   2. Left ventricular ejection fraction, by estimation, is 60 to 65%. The  left ventricle has normal function. The left ventricle has no regional  wall motion abnormalities. There is mild  asymmetric left ventricular  hypertrophy of the basal-septal segment.  Left ventricular diastolic parameters are consistent with Grade I  diastolic dysfunction (impaired relaxation).   3. Right ventricular systolic function is normal. The right ventricular  size is normal. There is normal pulmonary artery systolic pressure. The  estimated right ventricular systolic pressure is 62.9 mmHg.   4. The mitral valve is grossly normal. No evidence of mitral valve  regurgitation. No evidence of mitral stenosis.   5. The inferior vena cava is normal in size with greater than 50%  respiratory variability, suggesting right atrial pressure of 3 mmHg.  IVCD, no significant changes  EKG:  EKG is  ordered today.  The ekg ordered today demonstrates NSR, 76bpm,   Recent Labs: 04/20/2021: ALT 17; BUN 28; Creat 1.18; Hemoglobin 10.3; Platelets 160; Potassium 4.0; Sodium 142; TSH 3.00  Recent Lipid Panel    Component Value Date/Time   CHOL 155 04/20/2021 0821   CHOL 170 05/03/2020 0935   TRIG 95 04/20/2021 0821   HDL 65 04/20/2021 0821   HDL 54 05/03/2020 0935   CHOLHDL 2.4 04/20/2021 0821   VLDL 26 06/03/2016 0001   LDLCALC 72 04/20/2021 0821    Physical Exam:    VS:  BP 138/62 (BP Location: Left Arm, Patient Position: Sitting, Cuff Size: Large)    Pulse 76    Ht 5' (1.524 m)    Wt 235 lb 6 oz (106.8 kg)    SpO2 99%    BMI 45.97 kg/m     Wt Readings from Last 3 Encounters:  06/06/21 235 lb 6 oz (106.8 kg)  05/18/21 232 lb 9.6 oz (105.5 kg)  04/25/21 232 lb 3.2 oz (105.3 kg)     GEN:  Well nourished, well developed in no acute distress HEENT: Normal NECK: No JVD; No carotid bruits LYMPHATICS: No lymphadenopathy CARDIAC: RRR, + murmur,no rubs, gallops RESPIRATORY:  Clear to auscultation without rales, wheezing or rhonchi  ABDOMEN: Soft, non-tender, non-distended MUSCULOSKELETAL:  No edema; No deformity  SKIN: Warm and dry NEUROLOGIC:  Alert and oriented x 3 PSYCHIATRIC:  Normal affect    ASSESSMENT:    1. S/P TAVR (transcatheter aortic valve replacement)   2. Essential hypertension   3. Hypercholesteremia   4. Severe aortic stenosis   5. Hyperlipidemia associated with type 2 diabetes mellitus ([place])   6. Anemia due to multiple mechanisms    PLAN:    In order of problems listed above:  Severe AS s/p TAVR Patient follows with structural heart team. Last echo 11/2020 showed LVEF 60%,  stable valve function with a mean gradient of 3mHg. SBE prophylaxis needed with Keflex due to PCN allergy. Continue Aspirin.   HTN BP reasonable today. Continue current medications  Chronic Anemia/Jehova's witness She has chronic anemia with history or iron infusions. Most recent Hgb 10.3. She follows with hematology every 4-6 months  Chronic HFpEF She has occasional LLE for which she takes lasix PRN. She needed lasix last week due to dietary noncompliance (increased salt intake). She normally does not need lasix. No worsening SOB, orthopnea. Continue Lasix PRN.   HLD LDL 72, goal less than 70 with diabetes. Diet changes encouraged. Continue Simvastatin.   Disposition: Follow up in 1 year(s) with MD/APP   Signed, Nashika Coker HNinfa Meeker PA-C  06/06/2021 10:28 AM    [facility]

## 2021-06-06 ENCOUNTER — Other Ambulatory Visit: Payer: Self-pay

## 2021-06-06 ENCOUNTER — Ambulatory Visit: Payer: Medicare Other | Admitting: Medical

## 2021-06-06 ENCOUNTER — Encounter: Payer: Self-pay | Admitting: Medical

## 2021-06-06 VITALS — BP 138/62 | HR 76 | Ht 60.0 in | Wt 235.4 lb

## 2021-06-06 DIAGNOSIS — D6489 Other specified anemias: Secondary | ICD-10-CM | POA: Diagnosis not present

## 2021-06-06 DIAGNOSIS — E1169 Type 2 diabetes mellitus with other specified complication: Secondary | ICD-10-CM

## 2021-06-06 DIAGNOSIS — I350 Nonrheumatic aortic (valve) stenosis: Secondary | ICD-10-CM

## 2021-06-06 DIAGNOSIS — E7800 Pure hypercholesterolemia, unspecified: Secondary | ICD-10-CM

## 2021-06-06 DIAGNOSIS — E785 Hyperlipidemia, unspecified: Secondary | ICD-10-CM | POA: Diagnosis not present

## 2021-06-06 DIAGNOSIS — I10 Essential (primary) hypertension: Secondary | ICD-10-CM | POA: Diagnosis not present

## 2021-06-06 DIAGNOSIS — Z952 Presence of prosthetic heart valve: Secondary | ICD-10-CM | POA: Diagnosis not present

## 2021-06-06 MED ORDER — HYDROCHLOROTHIAZIDE 25 MG PO TABS: 25.0000 mg | ORAL_TABLET | Freq: Every day | ORAL | 2 refills | Status: DC

## 2021-06-06 MED ORDER — LOSARTAN POTASSIUM 50 MG PO TABS: 50.0000 mg | ORAL_TABLET | Freq: Every day | ORAL | 2 refills | Status: DC

## 2021-06-06 NOTE — Patient Instructions (Signed)
Medication Instructions:   Your physician recommends that you continue on your current medications as directed. Please refer to the Current Medication list given to you today.   *If you need a refill on your cardiac medications before your next appointment, please call your pharmacy*   Lab Work: None ordered  If you have labs (blood work) drawn today and your tests are completely normal, you will receive your results only by: [place] (if you have MyChart) OR A paper copy in the mail If you have any lab test that is abnormal or we need to change your treatment, we will call you to review the results.   Testing/Procedures: None ordered   Follow-Up: At [facility], you and your health needs are our priority.  As part of our continuing mission to provide you with exceptional heart care, we have created designated Provider Care Teams.  These Care Teams include your primary Cardiologist (physician) and Advanced Practice Providers (APPs -  Physician Assistants and Nurse Practitioners) who all work together to provide you with the care you need, when you need it.  We recommend signing up for the patient portal called "MyChart".  Sign up information is provided on this After Visit Summary.  MyChart is used to connect with patients for Virtual Visits (Telemedicine).  Patients are able to view lab/test results, encounter notes, upcoming appointments, etc.  Non-urgent messages can be sent to your provider as well.   To learn more about what you can do with MyChart, go to [email or web address].    Your next appointment:    Your physician wants you to follow-up in: 1 year.   You will receive a reminder letter in the mail two months in advance. If you don't receive a letter, please call our office to schedule the follow-up appointment.   The format for your next appointment:   In Person  Provider:   You may see Nelva Bush, MD or one of the following Advanced Practice Providers  on your designated Care Team:   Murray Hodgkins, NP Christell Faith, PA-C Cadence Kathlen Mody, PA-C :1}    Other Instructions N/A

## 2021-06-07 ENCOUNTER — Telehealth: Payer: Medicare Other

## 2021-06-07 ENCOUNTER — Ambulatory Visit (INDEPENDENT_AMBULATORY_CARE_PROVIDER_SITE_OTHER): Payer: Medicare Other

## 2021-06-07 DIAGNOSIS — M19041 Primary osteoarthritis, right hand: Secondary | ICD-10-CM

## 2021-06-07 DIAGNOSIS — E1169 Type 2 diabetes mellitus with other specified complication: Secondary | ICD-10-CM

## 2021-06-07 DIAGNOSIS — I5032 Chronic diastolic (congestive) heart failure: Secondary | ICD-10-CM

## 2021-06-07 DIAGNOSIS — M19042 Primary osteoarthritis, left hand: Secondary | ICD-10-CM

## 2021-06-07 DIAGNOSIS — M79641 Pain in right hand: Secondary | ICD-10-CM

## 2021-06-07 DIAGNOSIS — I10 Essential (primary) hypertension: Secondary | ICD-10-CM

## 2021-06-07 NOTE — Chronic Care Management (AMB) (Signed)
Chronic Care Management   CCM RN Visit Note  06/07/2021 Name: Diana Harmon MRN: [id number] DOB: 1946-05-03  Subjective: Diana Harmon is a 76 y.o. year old female who is a primary care patient of [name], DO. The care management team was consulted for assistance with disease management and care coordination needs.    Engaged with patient by telephone for follow up visit in response to provider referral for case management and/or care coordination services.   Consent to Services:  The patient was given information about Chronic Care Management services, agreed to services, and gave verbal consent prior to initiation of services.  Please see initial visit note for detailed documentation.   Patient agreed to services and verbal consent obtained.   Assessment: Review of patient past medical history, allergies, medications, health status, including review of consultants reports, laboratory and other test data, was performed as part of comprehensive evaluation and provision of chronic care management services.   SDOH (Social Determinants of Health) assessments and interventions performed:    CCM Care Plan  Allergies  Allergen Reactions   Ferumoxytol Swelling    Swelling in neck Swelling in neck Swelling in neck   Penicillins Rash, Swelling and Hives   Iron    Other Other (See Comments)    Other reaction(s): Other (See Comments) She refuses blood products-Jehovah witness She refuses blood products-Jehovah witness Other reaction(s): Other (See Comments) She refuses blood products-Jehovah witness    Outpatient Encounter Medications as of 06/07/2021  Medication Sig   ACCU-CHEK GUIDE test strip Use to check blood sugar twice daily   Accu-Chek Softclix Lancets lancets Use to check blood sugar twice daily   acetaminophen (TYLENOL) 500 MG tablet Take 1,000 mg by mouth every 6 (six) hours as needed for moderate pain.    Artificial Tear Solution (GENTEAL  TEARS) 0.1-0.2-0.3 % SOLN Place 1 drop into both eyes 3 (three) times daily as needed (dry/irritated eyes.).   aspirin 81 MG chewable tablet Chew 1 tablet (81 mg total) by mouth daily.   B COMPLEX-C PO Take 10 mLs by mouth daily.   baclofen (LIORESAL) 10 MG tablet TAKE 1 TO 2 TABLETS BY MOUTH TWICE DAILY AS NEEDED FOR MUSCLE SPASM   Blood Glucose Monitoring Suppl (ACCU-CHEK GUIDE) w/Device KIT Use to check blood sugar twice daily   Blood Glucose Monitoring Suppl (GLUCOCOM BLOOD GLUCOSE MONITOR) DEVI Use to check blood sugar twice daily   cephALEXin (KEFLEX) 500 MG capsule    Cholecalciferol (VITAMIN D3) 125 MCG (5000 UT) TABS Take 5,000 Units by mouth daily.   cyanocobalamin 1000 MCG tablet Take by mouth.   Ferrous Sulfate (IRON) 325 (65 Fe) MG TABS TAKE 1 TABLET BY MOUTH TWICE DAILY WITH A MEAL   furosemide (LASIX) 20 MG tablet Take 1 tablet (20 mg total) by mouth daily as needed for edema.   hydrochlorothiazide (HYDRODIURIL) 25 MG tablet Take 1 tablet (25 mg total) by mouth daily.   hydroxychloroquine (PLAQUENIL) 200 MG tablet Take 1 tablet by mouth 2 (two) times daily.   lactobacillus acidophilus (BACID) TABS tablet Take 1 tablet by mouth daily as needed (digestive health.).   Lancets Misc. KIT Use to check blood sugar twice daily   levocetirizine (XYZAL) 5 MG tablet TAKE 1 TABLET BY MOUTH ONCE DAILY IN THE EVENING (Patient taking differently: Take 1/2 tablet (2.5 mg) daily in the evening as needed for allergies)   losartan (COZAAR) 50 MG tablet Take 1 tablet (50 mg total) by mouth daily.  magnesium oxide (MAG-OX) 400 MG tablet Take 400 mg by mouth daily.   simvastatin (ZOCOR) 20 MG tablet TAKE 1 TABLET BY MOUTH ONCE DAILY AT  6  PM   TRULICITY 7.32 KG/2.5KY SOPN Inject 0.75 mg into the skin once a week.   [DISCONTINUED] Cromolyn Sodium (NASAL ALLERGY NA) Place 1 spray into the nose daily as needed (allergies).   No facility-administered encounter medications on file as of 06/07/2021.     Patient Active Problem List   Diagnosis Date Noted   Protrusion of intervertebral disc of thoracic region 02/19/2021   Sjogren's syndrome with inflammatory arthritis ([place]) 02/19/2021   S/P TAVR (transcatheter aortic valve replacement) 11/09/2019   Patient is [name]    Severe aortic stenosis    Thyroid disease    Chronic heart failure with preserved ejection fraction (HFpEF) ([place]) 70/62/3762   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/22/2019   Arthralgia 04/22/2019   Normocytic anemia 02/17/2019   Primary osteoarthritis of right knee 02/06/2018   Morbid obesity ([place]) 11/05/2017   Neutropenia ([place]) 04/24/2017   Vitamin D deficiency 10/08/2016   Meniere's disease of right ear 07/04/2016   TIA (transient ischemic attack) 07/04/2016   Nonrheumatic aortic valve stenosis 07/04/2016   Seasonal allergies 01/30/2016   Recurrent UTI 10/26/2015   Iron deficiency anemia due to chronic blood loss 02/28/2015   History of adenomatous polyp of colon 02/13/2015   Osteoarthritis of both knees 12/05/2014   Acid reflux 12/05/2014   Benign neoplasm of colon 12/05/2014   Type 2 diabetes mellitus with other specified complication ([place]) 83/15/1761   Essential hypertension 12/05/2014   Hyperlipidemia associated with type 2 diabetes mellitus ([place]) 12/05/2014   Arthritis, degenerative 12/05/2014   Anemia due to multiple mechanisms 12/05/2014   Abnormal liver enzymes 12/05/2014   Anemia 12/05/2014    Conditions to be addressed/monitored:CHF, HTN, HLD, DMII, and Chronic pain  Care Plan : RNCM; General Plan of Care (Adult) for Chronic Disease Management and Care Coordination Needs  Updates made by Vanita Ingles, RN since 06/07/2021 12:00 AM     Problem: RNCM: Development of Plan of care for Chronic Disease Management (HTN/HLD/HF/DM/Chronic pain)   Priority: High     Long-Range Goal: RNCM: Effective Management of Plan of care for Chronic Disease Management  (HTN/HLD/HF/DM/Chronic pain)   Start Date: 02/22/2021  Expected End Date: 02/22/2022  Priority: High  Note:   Current Barriers:  Knowledge Deficits related to plan of care for management of CHF, HTN, HLD, DMII, and Chronic pain   Chronic Disease Management support and education needs related to CHF, HTN, HLD, DMII, and Chronic Pain  RNCM Clinical Goal(s):  Patient will verbalize understanding of plan for management of CHF, HTN, HLD, DMII, and Chronic Pain   verbalize basic understanding of CHF, HTN, HLD, DMII, Osteoarthritis, and Chronic Pain disease process and self health management plan  take all medications exactly as prescribed and will call provider for medication related questions demonstrate understanding of rationale for each prescribed medication  attend all scheduled medical appointments: 10-24-2021 at 0920 am demonstrate improved and ongoing adherence to prescribed treatment plan for CHF, HTN, HLD, DMII, Osteoarthritis, and Chronic Pain as evidenced by daily monitoring and recording of CBG  adherence to ADA/ carb modified diet adherence to prescribed medication regimen contacting provider for new or worsened symptoms or questions  demonstrate improved and ongoing health management independence  demonstrate a decrease in CHF, HTN, HLD, DMII, Osteoarthritis, and chronic pain exacerbations  demonstrate ongoing self health care  management ability of effective management of chronic conditions through collaboration with RN Care manager, provider, and care team.   Interventions: 1:1 collaboration with primary care provider regarding development and update of comprehensive plan of care as evidenced by provider attestation and co-signature Inter-disciplinary care team collaboration (see longitudinal plan of care) Evaluation of current treatment plan related to  self management and patient's adherence to plan as established by provider   SDOH Barriers (Status: Goal on track: YES.)   Patient interviewed and SDOH assessment performed        SDOH Interventions    Flowsheet Row Most Recent Value  SDOH Interventions   Food Insecurity Interventions Intervention Not Indicated  Financial Strain Interventions Intervention Not Indicated  Housing Interventions Intervention Not Indicated  Intimate Partner Violence Interventions Intervention Not Indicated  Physical Activity Interventions Other (Comments)  [The patient is doing exercises the PT has showed her]  Stress Interventions Intervention Not Indicated  Social Connections Interventions Other (Comment)  [good support system]  Transportation Interventions Intervention Not Indicated     Patient interviewed and appropriate assessments performed Provided patient with information about resources available for changes in SDOH and care guides available for any needs or resources in [facility] Discussed plans with patient for ongoing care management follow up and provided patient with direct contact information for care management team Advised patient to call the office for changes in [place], questions, or concerns    Heart Failure Interventions:  (Status: Goal on track: YES.) Basic overview and discussion of pathophysiology of Heart Failure reviewed; Provided education on low sodium diet. 06-07-2021: The patient is eating better and incorporating more vegetables in her diet. She is a Teaching laboratory technician ; Reviewed Heart Failure Action Plan in depth and provided written copy; Assessed need for readable accurate scales in home; Provided education about placing scale on hard, flat surface; Advised patient to weigh each morning after emptying bladder; Discussed importance of daily weight and advised patient to weigh and record daily; Reviewed role of diuretics in prevention of fluid overload and management of heart failure; Discussed the importance of keeping all appointments with provider. 10-24-2021 at 0920 am Provided patient with  education about the role of exercise in the management of heart failure;  Diabetes:  (Status: Goal on track: YES.) Lab Results  Component Value Date   HGBA1C 6.7 (H) 04/20/2021  Assessed patient's understanding of A1c goal: <7% Provided education to patient about basic DM disease process; Reviewed medications with patient and discussed importance of medication adherence. 03-05-2021: Incoming call from the patient asking if she could get the contact information for the pharm D. She is not out of her Trulicity but will need assistance with the refill. Number provided. Will also send an inbasket message to pharm D for follow up. 06-07-2021: Is working with the pharm D for ongoing support and management of medications ;        Reviewed prescribed diet with patient heart healthy/ADA diet. 06-07-2021: Is monitoring her dietary intake and is compliant with heart healthy/ADA diet. She is eating more vegetables and doing better with her dietary  restrictions ; Counseled on importance of regular laboratory monitoring as prescribed;        Discussed plans with patient for ongoing care management follow up and provided patient with direct contact information for care management team;      Provided patient with written educational materials related to hypo and hyperglycemia and importance of correct treatment. 04-05-2021: The patient states last week she had  a low of 64. States she was asymptomatic. She states she did not feel worse. Education on making sure when blood sugars are that low to eat well. Review of sx and sx of hypoglycemia and hyperglycemia. 06-07-2021: The patient denies any lows or highs.        Reviewed scheduled/upcoming provider appointments including: 04-25-2021 at 0840 am;         Advised patient, providing education and rationale, to check cbg as directed and record. 02-22-2021: The patient states that her glucose readings have been good. 120-162 with the highest in the last two weeks of 147.  04-05-2021: The patient provided several readings of her blood sugars. Range has been 64 to 146. Denies any acute findings. Has her log to take with her to the provider office at upcoming visit.  06-07-2021: The patient states her blood sugar this am at 116. She says her blood sugars are doing well. She is happy that they are doing so good.        call provider for findings outside established parameters;       Referral made to pharmacy team for assistance with ongoing support and education with medication management;       Review of patient status, including review of consultants reports, relevant laboratory and other test results, and medications completed;       Screening for signs and symptoms of depression related to chronic disease state;        Assessed social determinant of health barriers;       Last eye exam on 04-19-2021. No evidence of diabetic neuropathy. Has a cataract that has not matured yet that they are monitoring.    Hyperlipidemia:  (Status: Goal on track: YES.) Lab Results  Component Value Date   CHOL 155 04/20/2021   HDL 65 04/20/2021   LDLCALC 72 04/20/2021   TRIG 95 04/20/2021   CHOLHDL 2.4 04/20/2021     Medication review performed; medication list updated in electronic medical record. 06-07-2021: The patient is compliant with medications. Works with the [employer] D as well.  Provider established cholesterol goals reviewed. 06-07-2021: The patient is at goal, praised for being at goal. Counseled on importance of regular laboratory monitoring as prescribed. 06-07-2021: The patient has regular blood work done.  Provided HLD educational materials; Reviewed role and benefits of statin for ASCVD risk reduction; Discussed strategies to manage statin-induced myalgias; Reviewed importance of limiting foods high in cholesterol. 06-07-2021: Is compliant with heart healthy/ADA diet. The patient states that she is doing well with her diet and much better eating habits;  Hypertension:  (Status: Goal on track: YES.) Last practice recorded BP readings:  BP Readings from Last 3 Encounters:  06/06/21 138/62  05/18/21 (!) 148/70  04/25/21 (!) 140/55  06-07-2021: Self reported blood pressure from today's reading is 136/71 with a pulse of 78 Most recent eGFR/CrCl: No results found for: EGFR  No components found for: CRCL  Evaluation of current treatment plan related to hypertension self management and patient's adherence to plan as established by provider. 06-07-2021: The patient is compliant with plan of care for HTN and maintenance of Heart health. She saw the pcp in December and saw the cardiologist yesterday. She had a good report and no changes in her medications regimen;   Provided education to patient re: stroke prevention, s/s of heart attack and stroke; Reviewed prescribed diet heart healthy/ADA. 06-07-2021: Review of heart healthy/ADA diet. The patient is doing well with dietary restrictions. Is being more mindful of  what she eats and drinks.  Reviewed medications with patient and discussed importance of compliance. 06-07-2021: The patient is compliant with medications. Has follow up with the pcp in February;  Discussed plans with patient for ongoing care management follow up and provided patient with direct contact information for care management team; Advised patient, providing education and rationale, to monitor blood pressure daily and record, calling PCP for findings outside established parameters. 04-05-2021: The patient is checking her blood pressures daily. The patient denies any issues with blood pressures. Systolic range is 644 to 034 and diastolic 74-25. Review of goal of systolic of <956 and diastolic <38. The patient denies any headaches or other issues related to possible elevation in blood pressures. 06-07-2021: The patients self reported blood pressure today was 136/64 with a pulse of 78. She is doing well;  Reviewed scheduled/upcoming provider appointments including:  10-24-2021 at 0920am Provided education on prescribed diet heart healthy/ADA ;  Discussed complications of poorly controlled blood pressure such as heart disease, stroke, circulatory complications, vision complications, kidney impairment, sexual dysfunction;   Pain, OA, Sjogren's syndrome:  (Status: Goal on track: YES.) Pain assessment performed. 04-05-2021: The patient states her pain level today is 5 and she is tolerating her pain much better. 06-07-2021: The patient denies pain today. States that she is not having to wear the braces as much and the exercises have helped a lot.  Medications reviewed. 02-22-2021: Patient has been started on Plaquinel 200 mg BID. The patient takes Tylenol for OA pain and discomfort. 06-07-2021 The patient is compliant with medications, has ongoing support from the pharm D.  Reviewed provider established plan for pain management. 02-22-2021: The patient saw the specialist on Monday. She is going to take the Planquinel and see how that works. She has been working with PT and has strengthening exercises to do. She states that they may do more testing but are going to see how this works for now. She has been diagnosed with Sjogren's syndrome.  Also she has carpal tunnel syndrome and OA. 04-05-2021: The patient goes tomorrow to the specialist at 2 pm to follow up with the pain and numbness in her hands. She states she is doing much better with the exercises she has been doing and the recommendations given to her by the provider. 06-07-2021: The patient continues to do her exercises and other things and it is really helping her pain in her hands. She said the doctor told her they could do injections but she does not wish to do that.  Discussed importance of adherence to all scheduled medical appointments. 06-07-2021: [place] appointments.  Counseled on the importance of reporting any/all new or changed pain symptoms or management strategies to pain management provider; Advised patient to  report to care team affect of pain on daily activities; Discussed use of relaxation techniques and/or diversional activities to assist with pain reduction (distraction, imagery, relaxation, massage, acupressure, TENS, heat, and cold application; Reviewed with patient prescribed pharmacological and nonpharmacological pain relief strategies; Advised patient to discuss unresolved pain or changes in level or intensity of pain with provider; Screening for signs and symptoms of depression related to chronic disease state;  Assessed social determinant of health barriers;    Questionable Abscess to back  (Status: Goal Met.) Short Term Goal  Evaluation of current treatment plan related to  Possible abscess to back  ,  self-management and patient's adherence to plan as established by provider. Discussed plans with patient for ongoing care management follow up and provided  patient with direct contact information for care management team Advised patient to call the office for changes in the area on her back, for sx and sx of infection, and to call the office for a sooner appointment if need for evaluation on what the patient believes to be an abscess on her back. Many years ago she had something similar to this on her back in the same area and it had to be drained. ; Provided education to patient re: sx and sx of infection, monitoring for chills, elevation in body temperature, increased redness to the area, pustulant drainage from the area; Reviewed scheduled/upcoming provider appointments including 04-25-2021 at [place] am; Advised patient to discuss area to the back that is sore with a little redness. with provider;  Collaboration with pcp about the patients concern about area to her back she calls a "possible abscess". The patient had something similar in the same area several years ago and it had to have surgical intervention to be drained. The patient states this are is sore and per her daughter a little red.  The patient has upcoming appointment with pcp. Advised the patient to monitor for sx and sx of infection and to call for a sooner appointment if needed. 06-07-2021: The patient was evaluated at the pcp visit on 04-25-2022 and there were not findings with infection or new concerns for cyst. Pcp wrote in notes: "Prior epidermoid cyst on back s/p excision years ago - resolved. Likely some scar tissue on back from the prior cyst removal. No sign of active cyst or infection."  Patient Goals/Self-Care Activities: Patient will self administer medications as prescribed as evidenced by self report/primary caregiver report  Patient will attend all scheduled provider appointments as evidenced by clinician review of documented attendance to scheduled appointments and patient/caregiver report Patient will call pharmacy for medication refills as evidenced by patient report and review of pharmacy fill history as appropriate Patient will attend church or other social activities as evidenced by patient report Patient will continue to perform ADL's independently as evidenced by patient/caregiver report Patient will continue to perform IADL's independently as evidenced by patient/caregiver report Patient will call provider office for new concerns or questions as evidenced by review of documented incoming telephone call notes and patient report Patient will work with BSW to address care coordination needs and will continue to work with the clinical team to address health care and disease management related needs as evidenced by documented adherence to scheduled care management/care coordination appointments - call office if I gain more than 2 pounds in one day or 5 pounds in one week - keep legs up while sitting - track weight in diary - use salt in moderation - watch for swelling in feet, ankles and legs every day - weigh myself daily - begin a heart failure diary - bring diary to all appointments - develop a rescue  plan - follow rescue plan if symptoms flare-up - eat more whole grains, fruits and vegetables, lean meats and healthy fats - know when to call the doctor:for changes in HF and a rescue plan for periods of exacerbation - track symptoms and what helps feel better or worse - keep appointment with eye doctor - check blood sugar at prescribed times: before meals and at bedtime, when you have symptoms of low or high blood sugar, and before and after exercise - check feet daily for cuts, sores or redness - enter blood sugar readings and medication or insulin into daily log - take the  blood sugar log to all doctor visits - trim toenails straight across - drink 6 to 8 glasses of water each day - eat fish at least once per week - fill half of plate with vegetables - keep a food diary - limit fast food meals to no more than 1 per week - manage portion size - prepare main meal at home 3 to 5 days each week - read food labels for fat, fiber, carbohydrates and portion size - reduce red meat to 2 to 3 times a week - keep feet up while sitting - wash and dry feet carefully every day - wear comfortable, cotton socks - wear comfortable, well-fitting shoes - check blood pressure 3 times per week - choose a place to take my blood pressure (home, clinic or office, retail store) - write blood pressure results in a log or diary - learn about high blood pressure - keep a blood pressure log - take blood pressure log to all doctor appointments - call doctor for signs and symptoms of high blood pressure - develop an action plan for high blood pressure - keep all doctor appointments - take medications for blood pressure exactly as prescribed - report new symptoms to your doctor - eat more whole grains, fruits and vegetables, lean meats and healthy fats - call for medicine refill 2 or 3 days before it runs out - take all medications exactly as prescribed - call doctor with any symptoms you believe are  related to your medicine - call doctor when you experience any new symptoms - go to all doctor appointments as scheduled - adhere to prescribed diet: heart healthy/ADA       Plan:Telephone follow up appointment with care management team member scheduled for:  08-09-2021 at 230  pm  Noreene Larsson RN, MSN, [facility] Mobile: [phone]

## 2021-06-07 NOTE — Patient Instructions (Signed)
Visit Information  Thank you for taking time to visit with me today. Please don't hesitate to contact me if I can be of assistance to you before our next scheduled telephone appointment.  Following are the goals we discussed today:  RNCM Clinical Goal(s):  Patient will verbalize understanding of plan for management of CHF, HTN, HLD, DMII, and Chronic Pain   verbalize basic understanding of CHF, HTN, HLD, DMII, Osteoarthritis, and Chronic Pain disease process and self health management plan  take all medications exactly as prescribed and will call provider for medication related questions demonstrate understanding of rationale for each prescribed medication  attend all scheduled medical appointments: 10-24-2021 at 0920 am demonstrate improved and ongoing adherence to prescribed treatment plan for CHF, HTN, HLD, DMII, Osteoarthritis, and Chronic Pain as evidenced by daily monitoring and recording of CBG  adherence to ADA/ carb modified diet adherence to prescribed medication regimen contacting provider for new or worsened symptoms or questions  demonstrate improved and ongoing health management independence  demonstrate a decrease in CHF, HTN, HLD, DMII, Osteoarthritis, and chronic pain exacerbations  demonstrate ongoing self health care management ability of effective management of chronic conditions through collaboration with RN Care manager, provider, and care team.    Interventions: 1:1 collaboration with primary care provider regarding development and update of comprehensive plan of care as evidenced by provider attestation and co-signature Inter-disciplinary care team collaboration (see longitudinal plan of care) Evaluation of current treatment plan related to  self management and patient's adherence to plan as established by provider     SDOH Barriers (Status: Goal on track: YES.)  Patient interviewed and SDOH assessment performed        SDOH Interventions     Flowsheet Row Most  Recent Value  SDOH Interventions    Food Insecurity Interventions Intervention Not Indicated  Financial Strain Interventions Intervention Not Indicated  Housing Interventions Intervention Not Indicated  Intimate Partner Violence Interventions Intervention Not Indicated  Physical Activity Interventions Other (Comments)  [The patient is doing exercises the PT has showed her]  Stress Interventions Intervention Not Indicated  Social Connections Interventions Other (Comment)  [good support system]  Transportation Interventions Intervention Not Indicated       Patient interviewed and appropriate assessments performed Provided patient with information about resources available for changes in SDOH and care guides available for any needs or resources in [facility] Discussed plans with patient for ongoing care management follow up and provided patient with direct contact information for care management team Advised patient to call the office for changes in [place], questions, or concerns       Heart Failure Interventions:  (Status: Goal on track: YES.) Basic overview and discussion of pathophysiology of Heart Failure reviewed; Provided education on low sodium diet. 06-07-2021: The patient is eating better and incorporating more vegetables in her diet. She is a Teaching laboratory technician ; Reviewed Heart Failure Action Plan in depth and provided written copy; Assessed need for readable accurate scales in home; Provided education about placing scale on hard, flat surface; Advised patient to weigh each morning after emptying bladder; Discussed importance of daily weight and advised patient to weigh and record daily; Reviewed role of diuretics in prevention of fluid overload and management of heart failure; Discussed the importance of keeping all appointments with provider. 10-24-2021 at 0920 am Provided patient with education about the role of exercise in the management of heart failure;   Diabetes:  (Status:  Goal on track: YES.)  Lab Results  Component Value Date    HGBA1C 6.7 (H) 04/20/2021  Assessed patient's understanding of A1c goal: <7% Provided education to patient about basic DM disease process; Reviewed medications with patient and discussed importance of medication adherence. 03-05-2021: Incoming call from the patient asking if she could get the contact information for the pharm D. She is not out of her Trulicity but will need assistance with the refill. Number provided. Will also send an inbasket message to pharm D for follow up. 06-07-2021: Is working with the pharm D for ongoing support and management of medications ;        Reviewed prescribed diet with patient heart healthy/ADA diet. 06-07-2021: Is monitoring her dietary intake and is compliant with heart healthy/ADA diet. She is eating more vegetables and doing better with her dietary  restrictions ; Counseled on importance of regular laboratory monitoring as prescribed;        Discussed plans with patient for ongoing care management follow up and provided patient with direct contact information for care management team;      Provided patient with written educational materials related to hypo and hyperglycemia and importance of correct treatment. 04-05-2021: The patient states last week she had a low of 64. States she was asymptomatic. She states she did not feel worse. Education on making sure when blood sugars are that low to eat well. Review of sx and sx of hypoglycemia and hyperglycemia. 06-07-2021: The patient denies any lows or highs.        Reviewed scheduled/upcoming provider appointments including: 04-25-2021 at 0840 am;         Advised patient, providing education and rationale, to check cbg as directed and record. 02-22-2021: The patient states that her glucose readings have been good. 120-162 with the highest in the last two weeks of 147. 04-05-2021: The patient provided several readings of her blood sugars. Range has been 64 to  146. Denies any acute findings. Has her log to take with her to the provider office at upcoming visit.  06-07-2021: The patient states her blood sugar this am at 116. She says her blood sugars are doing well. She is happy that they are doing so good.        call provider for findings outside established parameters;       Referral made to pharmacy team for assistance with ongoing support and education with medication management;       Review of patient status, including review of consultants reports, relevant laboratory and other test results, and medications completed;       Screening for signs and symptoms of depression related to chronic disease state;        Assessed social determinant of health barriers;       Last eye exam on 04-19-2021. No evidence of diabetic neuropathy. Has a cataract that has not matured yet that they are monitoring.     Hyperlipidemia:  (Status: Goal on track: YES.)      Lab Results  Component Value Date    CHOL 155 04/20/2021    HDL 65 04/20/2021    LDLCALC 72 04/20/2021    TRIG 95 04/20/2021    CHOLHDL 2.4 04/20/2021      Medication review performed; medication list updated in electronic medical record. 06-07-2021: The patient is compliant with medications. Works with the [place] D as well.  Provider established cholesterol goals reviewed. 06-07-2021: The patient is at goal, praised for being at goal. Counseled on importance of regular laboratory monitoring  as prescribed. 06-07-2021: The patient has regular blood work done.  Provided HLD educational materials; Reviewed role and benefits of statin for ASCVD risk reduction; Discussed strategies to manage statin-induced myalgias; Reviewed importance of limiting foods high in cholesterol. 06-07-2021: Is compliant with heart healthy/ADA diet. The patient states that she is doing well with her diet and much better eating habits;   Hypertension: (Status: Goal on track: YES.) Last practice recorded BP readings:     BP Readings  from Last 3 Encounters:  06/06/21 138/62  05/18/21 (!) 148/70  04/25/21 (!) 140/55  06-07-2021: Self reported blood pressure from today's reading is 136/71 with a pulse of 78 Most recent eGFR/CrCl: No results found for: EGFR  No components found for: CRCL   Evaluation of current treatment plan related to hypertension self management and patient's adherence to plan as established by provider. 06-07-2021: The patient is compliant with plan of care for HTN and maintenance of Heart health. She saw the pcp in December and saw the cardiologist yesterday. She had a good report and no changes in her medications regimen;   Provided education to patient re: stroke prevention, s/s of heart attack and stroke; Reviewed prescribed diet heart healthy/ADA. 06-07-2021: Review of heart healthy/ADA diet. The patient is doing well with dietary restrictions. Is being more mindful of what she eats and drinks.  Reviewed medications with patient and discussed importance of compliance. 06-07-2021: The patient is compliant with medications. Has follow up with the pcp in February;  Discussed plans with patient for ongoing care management follow up and provided patient with direct contact information for care management team; Advised patient, providing education and rationale, to monitor blood pressure daily and record, calling PCP for findings outside established parameters. 04-05-2021: The patient is checking her blood pressures daily. The patient denies any issues with blood pressures. Systolic range is 203 to 559 and diastolic 74-16. Review of goal of systolic of <384 and diastolic <53. The patient denies any headaches or other issues related to possible elevation in blood pressures. 06-07-2021: The patients self reported blood pressure today was 136/64 with a pulse of 78. She is doing well;  Reviewed scheduled/upcoming provider appointments including: 10-24-2021 at 0920am Provided education on prescribed diet heart healthy/ADA ;   Discussed complications of poorly controlled blood pressure such as heart disease, stroke, circulatory complications, vision complications, kidney impairment, sexual dysfunction;    Pain, OA, Sjogren's syndrome:  (Status: Goal on track: YES.) Pain assessment performed. 04-05-2021: The patient states her pain level today is 5 and she is tolerating her pain much better. 06-07-2021: The patient denies pain today. States that she is not having to wear the braces as much and the exercises have helped a lot.  Medications reviewed. 02-22-2021: Patient has been started on Plaquinel 200 mg BID. The patient takes Tylenol for OA pain and discomfort. 06-07-2021 The patient is compliant with medications, has ongoing support from the pharm D.  Reviewed provider established plan for pain management. 02-22-2021: The patient saw the specialist on Monday. She is going to take the Planquinel and see how that works. She has been working with PT and has strengthening exercises to do. She states that they may do more testing but are going to see how this works for now. She has been diagnosed with Sjogren's syndrome.  Also she has carpal tunnel syndrome and OA. 04-05-2021: The patient goes tomorrow to the specialist at 2 pm to follow up with the pain and numbness in her hands. She states  she is doing much better with the exercises she has been doing and the recommendations given to her by the provider. 06-07-2021: The patient continues to do her exercises and other things and it is really helping her pain in her hands. She said the doctor told her they could do injections but she does not wish to do that.  Discussed importance of adherence to all scheduled medical appointments. 06-07-2021: [place] appointments.  Counseled on the importance of reporting any/all new or changed pain symptoms or management strategies to pain management provider; Advised patient to report to care team affect of pain on daily activities; Discussed use of  relaxation techniques and/or diversional activities to assist with pain reduction (distraction, imagery, relaxation, massage, acupressure, TENS, heat, and cold application; Reviewed with patient prescribed pharmacological and nonpharmacological pain relief strategies; Advised patient to discuss unresolved pain or changes in level or intensity of pain with provider; Screening for signs and symptoms of depression related to chronic disease state;  Assessed social determinant of health barriers;      Questionable Abscess to back  (Status: Goal Met.) Short Term Goal  Evaluation of current treatment plan related to  Possible abscess to back  ,  self-management and patient's adherence to plan as established by provider. Discussed plans with patient for ongoing care management follow up and provided patient with direct contact information for care management team Advised patient to call the office for changes in the area on her back, for sx and sx of infection, and to call the office for a sooner appointment if need for evaluation on what the patient believes to be an abscess on her back. Many years ago she had something similar to this on her back in the same area and it had to be drained. ; Provided education to patient re: sx and sx of infection, monitoring for chills, elevation in body temperature, increased redness to the area, pustulant drainage from the area; Reviewed scheduled/upcoming provider appointments including 04-25-2021 at [place] am; Advised patient to discuss area to the back that is sore with a little redness. with provider;  Collaboration with pcp about the patients concern about area to her back she calls a "possible abscess". The patient had something similar in the same area several years ago and it had to have surgical intervention to be drained. The patient states this are is sore and per her daughter a little red. The patient has upcoming appointment with pcp. Advised the patient to  monitor for sx and sx of infection and to call for a sooner appointment if needed. 06-07-2021: The patient was evaluated at the pcp visit on 04-25-2022 and there were not findings with infection or new concerns for cyst. Pcp wrote in notes: "Prior epidermoid cyst on back s/p excision years ago - resolved. Likely some scar tissue on back from the prior cyst removal. No sign of active cyst or infection."   Patient Goals/Self-Care Activities: Patient will self administer medications as prescribed as evidenced by self report/primary caregiver report  Patient will attend all scheduled provider appointments as evidenced by clinician review of documented attendance to scheduled appointments and patient/caregiver report Patient will call pharmacy for medication refills as evidenced by patient report and review of pharmacy fill history as appropriate Patient will attend church or other social activities as evidenced by patient report Patient will continue to perform ADL's independently as evidenced by patient/caregiver report Patient will continue to perform IADL's independently as evidenced by patient/caregiver report Patient will call provider office  for new concerns or questions as evidenced by review of documented incoming telephone call notes and patient report Patient will work with BSW to address care coordination needs and will continue to work with the clinical team to address health care and disease management related needs as evidenced by documented adherence to scheduled care management/care coordination appointments - call office if I gain more than 2 pounds in one day or 5 pounds in one week - keep legs up while sitting - track weight in diary - use salt in moderation - watch for swelling in feet, ankles and legs every day - weigh myself daily - begin a heart failure diary - bring diary to all appointments - develop a rescue plan - follow rescue plan if symptoms flare-up - eat more whole  grains, fruits and vegetables, lean meats and healthy fats - know when to call the doctor:for changes in HF and a rescue plan for periods of exacerbation - track symptoms and what helps feel better or worse - keep appointment with eye doctor - check blood sugar at prescribed times: before meals and at bedtime, when you have symptoms of low or high blood sugar, and before and after exercise - check feet daily for cuts, sores or redness - enter blood sugar readings and medication or insulin into daily log - take the blood sugar log to all doctor visits - trim toenails straight across - drink 6 to 8 glasses of water each day - eat fish at least once per week - fill half of plate with vegetables - keep a food diary - limit fast food meals to no more than 1 per week - manage portion size - prepare main meal at home 3 to 5 days each week - read food labels for fat, fiber, carbohydrates and portion size - reduce red meat to 2 to 3 times a week - keep feet up while sitting - wash and dry feet carefully every day - wear comfortable, cotton socks - wear comfortable, well-fitting shoes - check blood pressure 3 times per week - choose a place to take my blood pressure (home, clinic or office, retail store) - write blood pressure results in a log or diary - learn about high blood pressure - keep a blood pressure log - take blood pressure log to all doctor appointments - call doctor for signs and symptoms of high blood pressure - develop an action plan for high blood pressure - keep all doctor appointments - take medications for blood pressure exactly as prescribed - report new symptoms to your doctor - eat more whole grains, fruits and vegetables, lean meats and healthy fats - call for medicine refill 2 or 3 days before it runs out - take all medications exactly as prescribed - call doctor with any symptoms you believe are related to your medicine - call doctor when you experience any new  symptoms - go to all doctor appointments as scheduled - adhere to prescribed diet: heart healthy/ADA      Our next appointment is by telephone on 08-09-2021 at 230 pm  Please call the care guide team at [phone] if you need to cancel or reschedule your appointment.   If you are experiencing a Mental Health or [place] or need someone to talk to, please call the Suicide and Crisis Lifeline: 988 call the [place] National Suicide Prevention Lifeline: (367) 600-3630 or TTY: 270-307-0043 TTY (660)767-0456) to talk to a trained counselor call 1-800-273-TALK (toll free, 24 hour hotline)  Patient verbalizes understanding of instructions and care plan provided today and agrees to view in [place]. Active MyChart status confirmed with patient.    Noreene Larsson RN, MSN, [place] Mobile: [phone]

## 2021-06-20 ENCOUNTER — Ambulatory Visit: Payer: Medicare Other | Admitting: Pharmacist

## 2021-06-20 DIAGNOSIS — E1169 Type 2 diabetes mellitus with other specified complication: Secondary | ICD-10-CM

## 2021-06-20 DIAGNOSIS — I10 Essential (primary) hypertension: Secondary | ICD-10-CM

## 2021-06-20 NOTE — Chronic Care Management (AMB) (Signed)
Chronic Care Management CCM Pharmacy Note  06/20/2021 Name:  Diana Harmon MRN:  [id number] DOB:  1945-11-02  Subjective: Diana Harmon is an 76 y.o. year old female who is a primary patient of [name], DO.  The CCM team was consulted for assistance with disease management and care coordination needs.    Engaged with patient by telephone for follow up visit for pharmacy case management and/or care coordination services.   Objective:  Medications Reviewed Today     Reviewed by Vanita Ingles, RN (Case Manager) on 06/07/21 at [place] List Status: <None>   Medication Order Taking? Sig Documenting Provider Last Dose Status Informant  ACCU-CHEK GUIDE test strip [id number] No Use to check blood sugar twice daily [name], DO Taking Active   Accu-Chek Softclix Lancets lancets [id number] No Use to check blood sugar twice daily [name], DO Taking Active   acetaminophen (TYLENOL) 500 MG tablet [id number] No Take 1,000 mg by mouth every 6 (six) hours as needed for moderate pain.  [provider] Taking Active Child  Artificial Tear Solution (GENTEAL TEARS) 0.1-0.2-0.3 % SOLN [id number] No Place 1 drop into both eyes 3 (three) times daily as needed (dry/irritated eyes.). [provider] Taking Active Child  aspirin 81 MG chewable tablet [id number] No Chew 1 tablet (81 mg total) by mouth daily. Eileen Stanford, PA-C Taking Active Child  B COMPLEX-C PO [id number] No Take 10 mLs by mouth daily. [provider] Taking Active Child  baclofen (LIORESAL) 10 MG tablet [id number] No TAKE 1 TO 2 TABLETS BY MOUTH TWICE DAILY AS NEEDED FOR MUSCLE SPASM Parks Ranger, Devonne Doughty, DO Taking Active   Blood Glucose Monitoring Suppl (ACCU-CHEK GUIDE) w/Device KIT [id number] No Use to check blood sugar twice daily [name], DO Taking Active   Blood Glucose Monitoring Suppl (GLUCOCOM BLOOD GLUCOSE MONITOR) DEVI  [id number] No Use to check blood sugar twice daily [provider] Taking Active   cephALEXin (KEFLEX) 500 MG capsule [id number] No  [provider] Taking Active   Cholecalciferol (VITAMIN D3) 125 MCG (5000 UT) TABS [id number] No Take 5,000 Units by mouth daily. [provider] Taking Active Child  Discontinued 09/05/19 1316   cyanocobalamin 1000 MCG tablet [id number] No Take by mouth. [provider] Taking Active   Ferrous Sulfate (IRON) 325 (65 Fe) MG TABS [id number] No TAKE 1 TABLET BY MOUTH TWICE DAILY WITH A MEAL [name], Devonne Doughty, DO Taking Active Child  furosemide (LASIX) 20 MG tablet [id number] No Take 1 tablet (20 mg total) by mouth daily as needed for edema. End, Harrell Gave, MD Taking Active Child  hydrochlorothiazide (HYDRODIURIL) 25 MG tablet [id number]  Take 1 tablet (25 mg total) by mouth daily. Furth, Cadence H, PA-C  Active   hydroxychloroquine (PLAQUENIL) 200 MG tablet [id number] No Take 1 tablet by mouth 2 (two) times daily. [provider] Taking Active   lactobacillus acidophilus (BACID) TABS tablet [id number] No Take 1 tablet by mouth daily as needed (digestive health.). [provider] Taking Active            Med Note Nat Christen   Wed Nov 03, 2019  3:06 PM)    Lancets Misc. KIT [id number] No Use to check blood sugar twice daily [provider] Taking Active   levocetirizine (XYZAL) 5 MG tablet [id number] No TAKE 1 TABLET BY MOUTH ONCE DAILY IN THE EVENING  Patient taking differently: Take 1/2 tablet (2.5 mg) daily  in the evening as needed for allergies   [name], DO Taking Active   losartan (COZAAR) 50 MG tablet [id number]  Take 1 tablet (50 mg total) by mouth daily. Furth, Cadence H, PA-C  Active   magnesium oxide (MAG-OX) 400 MG tablet [id number] No Take 400 mg by mouth daily. [provider] Taking Active   simvastatin (ZOCOR) 20 MG tablet [id number] No TAKE 1 TABLET BY MOUTH ONCE  DAILY AT  6  PM [name], Devonne Doughty, DO Taking Active   TRULICITY 9.16 BW/4.6KZ SOPN [id number] No Inject 0.75 mg into the skin once a week. [name], DO Taking Active             Pertinent Labs:  Lab Results  Component Value Date   HGBA1C 6.7 (H) 04/20/2021   Lab Results  Component Value Date   CHOL 155 04/20/2021   HDL 65 04/20/2021   LDLCALC 72 04/20/2021   TRIG 95 04/20/2021   CHOLHDL 2.4 04/20/2021   Lab Results  Component Value Date   CREATININE 1.18 (H) 04/20/2021   BUN 28 (H) 04/20/2021   NA 142 04/20/2021   K 4.0 04/20/2021   CL 106 04/20/2021   CO2 29 04/20/2021    SDOH:  (Social Determinants of Health) assessments and interventions performed:    [place]  Review of patient past medical history, allergies, medications, health status, including review of consultants reports, laboratory and other test data, was performed as part of comprehensive evaluation and provision of chronic care management services.   Care Plan : PharmD - Medication Assistance/Management  Updates made by Rennis Petty, RPH-CPP since 06/20/2021 12:00 AM     Problem: Disease Progression      Long-Range Goal: Disease Progression Prevented or Minimized   Start Date: 10/30/2020  Expected End Date: 01/28/2021  This Visit's Progress: On track  Recent Progress: On track  Priority: High  Note:   Current Barriers:  Unable to independently afford treatment regimen Patient APPROVED for [facility] Patient Assistance Program for Trulicity, eligible from 11/21/20-05/05/22  Pharmacist Clinical Goal(s):  Over the next 90 days, patient will verbalize ability to afford treatment regimen through collaboration with PharmD and provider.   Interventions: 1:1 collaboration with [name], DO regarding development and update of comprehensive plan of care as evidenced by provider attestation and co-signature Inter-disciplinary care team collaboration (see  longitudinal plan of care) Perform chart review. Patient seen for Office Visit with [facility] on 2/1 Note patient uses weekly pillbox to organize her medications; denies missed doses Reports/note patient called [facility] to request 90 day supply of Planquenil Rx. From chart, appears office sent Rx for #90 tablets, rather than 90 day supply Today place call to [facility] [place] [facility] and leave message with Jinny Blossom to request Rx be resent for #180 tablets for 90 day supply  T2DM: Controlled; current treatment: Trulicity 9.39 mg weekly Previous therapies: metformin Reports recent morning fasting blood sugar readings ranging: 118- 127 Denies symptoms of hypoglycemia Reports enjoying making positive dietary changes, including: Drinking more water and having more non-starchy vegetables  Baking chicken and eating salads at home more Noticing impact of dietary choices on blood sugar Encourage patient to continue to review nutrition labels for total carbohydrate content and serving size Cholesterol management: simvastatin 20 mg daily Exercise Reports exercise limited by arthritis.  Previously went to the pool, but stopped going due to COVID-19 pandemic Reports uses upright exercise bike, exercise from PT and/or yoga  at home for 30 minutes x 3 days/week Encourage patient to continue to monitor home blood sugar, keep log and bring record with her to medical appointments   HTN: Current treatment: HCTZ 25 mg daily Losartan 50 mg daily Furosemide 20 mg daily as needed for edema Reports last checked home blood pressure yesterday: 147/63, HR 78 Attributes elevated reading to arthritis knee pain Note patient has home upper arm blood pressure monitor Counsel on impact of salt/sodium in diet on blood pressure. Encourage patient to limit sodium intake Encouraged patient to monitor daily weights and follow direction from Cardiology for taking furosemide as needed   Encourage patient to bring record of daily weights and home BP readings with her to medical appointments, but to call provider sooner if readings outside of established parameters   Medication Assistance: Collaborated with [employer] to aid patient in reapply to [place] patient assistance program for 2023 calendar year From review of chart, note [facility] Sharee Pimple [place] contacted [employer] in regard to patient's Trulicity re-enrollment application on 82/50/0370 and was advised patient APPROVED 05/06/21-05/05/22 and medication would begin to ship in January based on last fill date in 2022, to be delivered to patient's home Today reports has 5 pens of Trulicity remaining.  Reports called assistance program regarding refill and was told it should arrive this week Reports will call to follow up if not received by next week   Patient Goals/Self-Care Activities Over the next 90 days, patient will:  - take medications as prescribed - check glucose, document, and provide at future appointments - check blood pressure, document, and provide at future appointments - collaborate with provider on medication access solutions - attend medical appointments as scheduled  Follow Up Plan: Telephone follow up appointment with care management team member scheduled for: 07/16/2021 at 10:45 AM       Wallace Cullens, PharmD, Para [place], [place] [phone]

## 2021-06-20 NOTE — Patient Instructions (Signed)
Visit Information  Thank you for taking time to visit with me today. Please don't hesitate to contact me if I can be of assistance to you before our next scheduled telephone appointment.  Following are the goals we discussed today:   Goals Addressed             This Visit's Progress    Pharmacy Goals       Our goal A1c is less than 7%. This corresponds with fasting sugars less than 130 and 2 hour after meal sugars less than 180. Please keep a log of your results when checking your blood sugar  Our goal bad cholesterol, or LDL, is less than 70 . This is why it is important to continue taking your simvastatin  Please check your home blood pressure, keep a log of the results and bring this with you to your medical appointments.  Feel free to call me with any questions or concerns. I look forward to our next call!   Wallace Cullens, PharmD, Para March, CPP Clinical Pharmacist [facility] [phone]          Our next appointment is by telephone on 07/16/2021 at 10:45 AM  Please call the care guide team at [phone] if you need to cancel or reschedule your appointment.    Patient verbalizes understanding of instructions and care plan provided today and agrees to view in [place]. Active MyChart status confirmed with patient.

## 2021-07-03 DIAGNOSIS — I10 Essential (primary) hypertension: Secondary | ICD-10-CM | POA: Diagnosis not present

## 2021-07-03 DIAGNOSIS — I5032 Chronic diastolic (congestive) heart failure: Secondary | ICD-10-CM

## 2021-07-03 DIAGNOSIS — E785 Hyperlipidemia, unspecified: Secondary | ICD-10-CM

## 2021-07-03 DIAGNOSIS — E1169 Type 2 diabetes mellitus with other specified complication: Secondary | ICD-10-CM | POA: Diagnosis not present

## 2021-07-03 DIAGNOSIS — M19042 Primary osteoarthritis, left hand: Secondary | ICD-10-CM | POA: Diagnosis not present

## 2021-07-03 DIAGNOSIS — M19041 Primary osteoarthritis, right hand: Secondary | ICD-10-CM | POA: Diagnosis not present

## 2021-07-05 ENCOUNTER — Other Ambulatory Visit: Payer: Self-pay | Admitting: *Deleted

## 2021-07-05 DIAGNOSIS — D500 Iron deficiency anemia secondary to blood loss (chronic): Secondary | ICD-10-CM

## 2021-07-12 ENCOUNTER — Inpatient Hospital Stay: Payer: Medicare Other | Admitting: Internal Medicine

## 2021-07-12 ENCOUNTER — Other Ambulatory Visit: Payer: Self-pay

## 2021-07-12 ENCOUNTER — Encounter: Payer: Self-pay | Admitting: Internal Medicine

## 2021-07-12 ENCOUNTER — Inpatient Hospital Stay: Payer: Medicare Other | Attending: Internal Medicine

## 2021-07-12 DIAGNOSIS — E1122 Type 2 diabetes mellitus with diabetic chronic kidney disease: Secondary | ICD-10-CM | POA: Diagnosis not present

## 2021-07-12 DIAGNOSIS — N183 Chronic kidney disease, stage 3 unspecified: Secondary | ICD-10-CM | POA: Insufficient documentation

## 2021-07-12 DIAGNOSIS — D649 Anemia, unspecified: Secondary | ICD-10-CM | POA: Diagnosis not present

## 2021-07-12 DIAGNOSIS — D509 Iron deficiency anemia, unspecified: Secondary | ICD-10-CM | POA: Insufficient documentation

## 2021-07-12 DIAGNOSIS — N189 Chronic kidney disease, unspecified: Secondary | ICD-10-CM | POA: Diagnosis not present

## 2021-07-12 DIAGNOSIS — Z7982 Long term (current) use of aspirin: Secondary | ICD-10-CM | POA: Insufficient documentation

## 2021-07-12 DIAGNOSIS — D500 Iron deficiency anemia secondary to blood loss (chronic): Secondary | ICD-10-CM

## 2021-07-12 DIAGNOSIS — M3500 Sicca syndrome, unspecified: Secondary | ICD-10-CM | POA: Diagnosis not present

## 2021-07-12 DIAGNOSIS — E079 Disorder of thyroid, unspecified: Secondary | ICD-10-CM | POA: Diagnosis not present

## 2021-07-12 DIAGNOSIS — D709 Neutropenia, unspecified: Secondary | ICD-10-CM | POA: Diagnosis not present

## 2021-07-12 DIAGNOSIS — E538 Deficiency of other specified B group vitamins: Secondary | ICD-10-CM | POA: Diagnosis not present

## 2021-07-12 DIAGNOSIS — I129 Hypertensive chronic kidney disease with stage 1 through stage 4 chronic kidney disease, or unspecified chronic kidney disease: Secondary | ICD-10-CM | POA: Diagnosis not present

## 2021-07-12 DIAGNOSIS — Z87891 Personal history of nicotine dependence: Secondary | ICD-10-CM | POA: Diagnosis not present

## 2021-07-12 DIAGNOSIS — Z79899 Other long term (current) drug therapy: Secondary | ICD-10-CM | POA: Diagnosis not present

## 2021-07-12 LAB — CBC WITH DIFFERENTIAL/PLATELET
Abs Immature Granulocytes: 0 10*3/uL (ref 0.00–0.07)
Basophils Absolute: 0 10*3/uL (ref 0.0–0.1)
Basophils Relative: 1 %
Eosinophils Absolute: 0.1 10*3/uL (ref 0.0–0.5)
Eosinophils Relative: 4 %
HCT: 33.7 % — ABNORMAL LOW (ref 36.0–46.0)
Hemoglobin: 10.6 g/dL — ABNORMAL LOW (ref 12.0–15.0)
Immature Granulocytes: 0 %
Lymphocytes Relative: 45 %
Lymphs Abs: 1.6 10*3/uL (ref 0.7–4.0)
MCH: 29.9 pg (ref 26.0–34.0)
MCHC: 31.5 g/dL (ref 30.0–36.0)
MCV: 95.2 fL (ref 80.0–100.0)
Monocytes Absolute: 0.4 10*3/uL (ref 0.1–1.0)
Monocytes Relative: 11 %
Neutro Abs: 1.4 10*3/uL — ABNORMAL LOW (ref 1.7–7.7)
Neutrophils Relative %: 39 %
Platelets: 140 10*3/uL — ABNORMAL LOW (ref 150–400)
RBC: 3.54 MIL/uL — ABNORMAL LOW (ref 3.87–5.11)
RDW: 13.7 % (ref 11.5–15.5)
WBC: 3.4 10*3/uL — ABNORMAL LOW (ref 4.0–10.5)
nRBC: 0 % (ref 0.0–0.2)

## 2021-07-12 LAB — BASIC METABOLIC PANEL
Anion gap: 9 (ref 5–15)
BUN: 34 mg/dL — ABNORMAL HIGH (ref 8–23)
CO2: 28 mmol/L (ref 22–32)
Calcium: 9.4 mg/dL (ref 8.9–10.3)
Chloride: 102 mmol/L (ref 98–111)
Creatinine, Ser: 1.43 mg/dL — ABNORMAL HIGH (ref 0.44–1.00)
GFR, Estimated: 38 mL/min — ABNORMAL LOW (ref >60)
Glucose, Bld: 131 mg/dL — ABNORMAL HIGH (ref 70–99)
Potassium: 4.1 mmol/L (ref 3.5–5.1)
Sodium: 139 mmol/L (ref 135–145)

## 2021-07-12 LAB — IRON AND TIBC
Iron: 82 ug/dL (ref 28–170)
Saturation Ratios: 23 % (ref 10.4–31.8)
TIBC: 351 ug/dL (ref 250–450)
UIBC: 269 ug/dL

## 2021-07-12 LAB — VITAMIN B12: Vitamin B-12: 4215 pg/mL — ABNORMAL HIGH (ref 180–914)

## 2021-07-12 LAB — FERRITIN: Ferritin: 87 ng/mL (ref 11–307)

## 2021-07-12 NOTE — Assessment & Plan Note (Addendum)
#  Normocytic anemia unclear etiology; previous history of AVM iron deficiency; September 2020-iron studies negative for deficiency.  Bone marrow biopsy negative for any significant dyspoiesis.  Cytogenetics normal. ? ?# Today hemoglobin is 10.6/? CKD vs others STABLE-  Slightly low; otherwise fairly asymptomatic.Patient is on p.o. iron.  March 2023 iron saturation 18% ferritin 83. ?                           ?#White count 3.4 chronic benign ethnic neutropenia-neutrophil count 1.6- STABLE.  ? ?# Relative B12 deficiency-200; March 2021 -normal continue sublingual B12 tablets.  ? ?#Chronic kidney disease-stage III mild [fu Nephrology-[facility]]; reviewed the note; again reviewed the potential causes of CKD include-diabetes hypertension age obesity etc. ? ?# DISPOSITION: ?# in 6 months-MD; labs- cbc/bmp/ iron studies/ferritin/ b12---Dr.B ? ?

## 2021-07-12 NOTE — Progress Notes (Signed)
[place] NOTE  Patient Care Team: [name], DO as PCP - General (Family Medicine) End, Harrell Gave, MD as PCP - Cardiology (Cardiology) Cammie Sickle, MD as Consulting Physician (Internal Medicine) Lorelee Cover., MD (Ophthalmology) End, Harrell Gave, MD as Consulting Physician (Cardiology) Vanita Ingles, RN as Case Manager (General Practice) Cammie Sickle, MD as Consulting Physician (Internal Medicine) Curley Spice Virl [name], RPH-CPP as Pharmacist  CHIEF COMPLAINTS/PURPOSE OF CONSULTATION:   # AUG 2016- IRON DEFICIENCY ANEMIA-  Possible AVMs of colon [s/p Argon laser; Dr.Elliot;EGD-neg Nov 2016] s/p IV Ferriheme x2 [[id number]; ALLERGY]; Venofer IV [pre-meds]; worsening anemia not responding to iron- November 2020-bone marrow biopsy-erythroid hyperplasia but no dysplasia no blasts. NOV 2020- CT A/P-negative; CXR-NEG.  #Relative B12 deficiency-sublingual B12 [Jan 2021]  #December 2020 -elevated anti-CCP- [s/p evaluation Dr. Elder Negus  # MILD LEUCOPENIA/NEUTROPENIA [facility] 1.2-1.3]-chronic ethnic neutropenia/asymptomatic  # Rectal well diff carcinoid [1.2CM; s/p polypectomy 2011]; Allergy- [facility] Bouchard [Nov 2016]- neck/face swelling [improved with benadryl]; June 2021- TAVR for severe AS  HISTORY OF PRESENTING ILLNESS: Patient accompanied by daughter.  In a wheelchair. Diana Harmon 76 y.o.  female is here for follow-up for anemia of unclear etiology is here for follow-up.  No new shortness of breath no cough.  No weight loss no blood in stools or black or stools.  Patient continues to be compliant with her iron tablets.  Review of Systems  Constitutional:  Negative for chills, diaphoresis, fever and weight loss.  HENT:  Negative for nosebleeds and sore throat.   Eyes:  Negative for double vision.  Respiratory:  Negative for cough, hemoptysis, sputum production, shortness of breath and wheezing.   Cardiovascular:   Negative for chest pain, palpitations, orthopnea and leg swelling.  Gastrointestinal:  Negative for abdominal pain, blood in stool, constipation, diarrhea, heartburn, melena, nausea and vomiting.  Genitourinary:  Negative for dysuria, frequency and urgency.  Musculoskeletal:  Positive for joint pain. Negative for back pain.  Skin: Negative.  Negative for itching and rash.  Neurological:  Negative for dizziness, tingling, focal weakness, weakness and headaches.  Endo/Heme/Allergies:  Does not bruise/bleed easily.  Psychiatric/Behavioral:  Negative for depression. The patient is not nervous/anxious and does not have insomnia.     MEDICAL HISTORY:  Past Medical History:  Diagnosis Date   Anemia    Arthritis    legs, knees, back, wrists   Diabetes mellitus without complication (HCC)    Hypertension    Nausea    Patient is [name]    S/P TAVR (transcatheter aortic valve replacement) 11/09/2019   s/p TAVR with a 26 mm Edwards S3U via the TF approach by Dr. Angelena Form & Bartle   Severe aortic stenosis    SIRS (systemic inflammatory response syndrome) ([place]) 11/2019   Sjogren's disease ([place])    Thyroid disease     SURGICAL HISTORY: Past Surgical History:  Procedure Laterality Date   CARDIAC CATHETERIZATION  10/19/2019   COLON SURGERY     caudarized polyps; rectal carcinoid excision ~ 2011   COLONOSCOPY     COLONOSCOPY WITH PROPOFOL N/A 03/13/2015   Procedure: COLONOSCOPY WITH PROPOFOL;  Surgeon: Manya Silvas, MD;  Location: [place];  Service: Endoscopy;  Laterality: N/A;   ESOPHAGOGASTRODUODENOSCOPY N/A 03/13/2015   Procedure: ESOPHAGOGASTRODUODENOSCOPY (EGD);  Surgeon: Manya Silvas, MD;  Location: [facility] ENDOSCOPY;  Service: Endoscopy;  Laterality: N/A;   INTRAOPERATIVE TRANSTHORACIC ECHOCARDIOGRAM N/A 11/09/2019   Procedure: INTRAOPERATIVE TRANSTHORACIC ECHOCARDIOGRAM;  Surgeon: Burnell Blanks, MD;  Location: [place];  Service: Open Heart  Surgery;  Laterality: N/A;   PARATHYROIDECTOMY  11/20/2004   right superior parathyroidecomy for primary hyperparathyroidism/parathyroid adenoma   RIGHT/LEFT HEART CATH AND CORONARY ANGIOGRAPHY N/A 10/19/2019   Procedure: RIGHT/LEFT HEART CATH AND CORONARY ANGIOGRAPHY;  Surgeon: Nelva Bush, MD;  Location: [place] CV LAB;  Service: Cardiovascular;  Laterality: N/A;   THYROIDECTOMY     TRANSCATHETER AORTIC VALVE REPLACEMENT, TRANSFEMORAL N/A 11/09/2019   Procedure: TRANSCATHETER AORTIC VALVE REPLACEMENT, TRANSFEMORAL;  Surgeon: Burnell Blanks, MD;  Location: [place];  Service: Open Heart Surgery;  Laterality: N/A;    SOCIAL HISTORY: Social History   Socioeconomic History   Marital status: Divorced    Spouse name: Not on file   Number of children: 2   Years of education: 12   Highest education level: 12th grade  Occupational History   Occupation: retired-worked with nandicapped adults  Tobacco Use   Smoking status: Former    Years: 20.00    Types: Cigarettes    Quit date: 09/05/1979    Years since quitting: 41.8   Smokeless tobacco: Former  Scientific laboratory technician Use: Never used  Substance and Sexual Activity   Alcohol use: Yes    Alcohol/week: 0.0 - 1.0 standard drinks    Comment: occ. wine   Drug use: No   Sexual activity: Not on file  Other Topics Concern   Not on file  Social History Narrative   Not on file   Social Determinants of Health   Financial Resource Strain: Low Risk    Difficulty of Paying Living Expenses: Not hard at all  Food Insecurity: No Food Insecurity   Worried About Charity fundraiser in the Last Year: Never true   [place] in the Last Year: Never true  Transportation Needs: No Transportation Needs   Lack of Transportation (Medical): No   Lack of Transportation (Non-Medical): No  Physical Activity: Insufficiently Active   Days of Exercise per Week: 2 days   Minutes of Exercise per Session: 40 min   Stress: No Stress Concern Present   Feeling of Stress : Not at all  Social Connections: Socially Isolated   Frequency of Communication with Friends and Family: Twice a week   Frequency of Social Gatherings with Friends and Family: Never   Attends Religious Services: More than 4 times per year   Active Member of [employer] or Organizations: No   Attends Music therapist: Never   Marital Status: Divorced  Human resources officer Violence: Not At Risk   Fear of Current or Ex-Partner: No   Emotionally Abused: No   Physically Abused: No   Sexually Abused: No    FAMILY HISTORY: Family History  Problem Relation Age of Onset   Parkinson's disease Mother    Heart disease Father    Lupus Sister    Aneurysm Son    Lupus Son    Breast cancer Maternal Aunt        mat great aunt    ALLERGIES:  is allergic to ferumoxytol, penicillins, iron, and other.  MEDICATIONS:  Current Outpatient Medications  Medication Sig Dispense Refill   ACCU-CHEK GUIDE test strip Use to check blood sugar twice daily 200 each 12   Accu-Chek Softclix Lancets lancets Use to check blood sugar twice daily 200 each 12   acetaminophen (TYLENOL) 500 MG tablet Take 1,000 mg by mouth every 6 (six) hours as needed for moderate pain.      Artificial Tear Solution (GENTEAL  TEARS) 0.1-0.2-0.3 % SOLN Place 1 drop into both eyes 3 (three) times daily as needed (dry/irritated eyes.).     aspirin 81 MG chewable tablet Chew 1 tablet (81 mg total) by mouth daily.     B COMPLEX-C PO Take 10 mLs by mouth daily.     baclofen (LIORESAL) 10 MG tablet TAKE 1 TO 2 TABLETS BY MOUTH TWICE DAILY AS NEEDED FOR MUSCLE SPASM 60 tablet 0   Blood Glucose Monitoring Suppl (ACCU-CHEK GUIDE) w/Device KIT Use to check blood sugar twice daily 1 kit 0   Blood Glucose Monitoring Suppl (GLUCOCOM BLOOD GLUCOSE MONITOR) DEVI Use to check blood sugar twice daily     Cholecalciferol (VITAMIN D3) 125 MCG (5000 UT) TABS Take 5,000  Units by mouth daily.     cyanocobalamin 1000 MCG tablet Take by mouth.     Ferrous Sulfate (IRON) 325 (65 Fe) MG TABS TAKE 1 TABLET BY MOUTH TWICE DAILY WITH A MEAL 180 each 1   furosemide (LASIX) 20 MG tablet Take 1 tablet (20 mg total) by mouth daily as needed for edema. 45 tablet 6   hydrochlorothiazide (HYDRODIURIL) 25 MG tablet Take 1 tablet (25 mg total) by mouth daily. 90 tablet 2   hydroxychloroquine (PLAQUENIL) 200 MG tablet Take 1 tablet by mouth 2 (two) times daily.     lactobacillus acidophilus (BACID) TABS tablet Take 1 tablet by mouth daily as needed (digestive health.).     Lancets Misc. KIT Use to check blood sugar twice daily     levocetirizine (XYZAL) 5 MG tablet TAKE 1 TABLET BY MOUTH ONCE DAILY IN THE EVENING (Patient taking differently: Take 1/2 tablet (2.5 mg) daily in the evening as needed for allergies) 90 tablet 3   losartan (COZAAR) 50 MG tablet Take 1 tablet (50 mg total) by mouth daily. 90 tablet 2   magnesium oxide (MAG-OX) 400 MG tablet Take 400 mg by mouth daily.     simvastatin (ZOCOR) 20 MG tablet TAKE 1 TABLET BY MOUTH ONCE DAILY AT  6  PM 90 tablet 3   TRULICITY 4.80 XK/5.5VZ SOPN Inject 0.75 mg into the skin once a week. 12 mL 1   cephALEXin (KEFLEX) 500 MG capsule  (Patient not taking: Reported on 07/12/2021)     No current facility-administered medications for this visit.    Patient is obese.  PHYSICAL EXAMINATION: ECOG PERFORMANCE STATUS: 1 - Symptomatic but completely ambulatory  Vitals:   07/12/21 0958  BP: (!) 130/56  Pulse: 86  Temp: 98 F (36.7 C)  SpO2: 99%   Filed Weights   07/12/21 0958  Weight: 236 lb 14.4 oz (107.5 kg)    Physical Exam HENT:     Head: Normocephalic and atraumatic.     Mouth/Throat:     Pharynx: No oropharyngeal exudate.  Eyes:     Pupils: Pupils are equal, round, and reactive to light.  Cardiovascular:     Rate and Rhythm: Normal rate and regular rhythm.  Pulmonary:     Effort: Pulmonary effort  is normal. No respiratory distress.     Breath sounds: Normal breath sounds. No wheezing.  Abdominal:     General: Bowel sounds are normal. There is no distension.     Palpations: Abdomen is soft. There is no mass.     Tenderness: There is no abdominal tenderness. There is no guarding or rebound.  Musculoskeletal:        General: No tenderness. Normal range of motion.     Cervical back:  Normal range of motion and neck supple.  Skin:    General: Skin is warm.  Neurological:     Mental Status: She is alert and oriented to person, place, and time.  Psychiatric:        Mood and Affect: Affect normal.    Latest Reference Range & Units 01/11/21 10:08  Iron 28 - 170 ug/dL 100  UIBC ug/dL 212  TIBC 250 - 450 ug/dL 312  Saturation Ratios 10.4 - 31.8 % 32 (H)  Ferritin 11 - 307 ng/mL 196  (H): Data is abnormally high  LABORATORY DATA:  I have reviewed the data as listed Lab Results  Component Value Date   WBC 3.4 (L) 07/12/2021   HGB 10.6 (L) 07/12/2021   HCT 33.7 (L) 07/12/2021   MCV 95.2 07/12/2021   PLT 140 (L) 07/12/2021   Recent Labs    07/13/20 1018 01/11/21 1008 04/20/21 0821 07/12/21 0940  NA 141 137 142 139  K 4.3 3.9 4.0 4.1  CL 108 103 106 102  CO2 _0 GLUCOSE 225* 150* 213* 131*  BUN 42* 29* 28* 34*  CREATININE 1.28* 1.04* 1.18* 1.43*  CALCIUM 9.2 9.0 9.1 9.4  GFRNONAA 44* 56*  --  38*  PROT  --   --  7.0  --   AST  --   --  26  --   ALT  --   --  17  --   BILITOT  --   --  0.3  --     ASSESSMENT & PLAN:  Normocytic anemia #Normocytic anemia unclear etiology; previous history of AVM iron deficiency; September 2020-iron studies negative for deficiency.  Bone marrow biopsy negative for any significant dyspoiesis.  Cytogenetics normal.  # Today hemoglobin is 10.6/? CKD vs others STABLE-  Slightly low; otherwise fairly asymptomatic.Patient is on p.o. iron.  March 2023 iron saturation 18% ferritin 83.                            #White count 3.4  chronic benign ethnic neutropenia-neutrophil count 1.6- STABLE.   # Relative B12 deficiency-200; March 2021 -normal continue sublingual B12 tablets.   #Chronic kidney disease-stage III mild [fu Nephrology-[facility]]; reviewed the note; again reviewed the potential causes of CKD include-diabetes hypertension age obesity etc.  # DISPOSITION: # in 6 months-MD; labs- cbc/bmp/ iron studies/ferritin/ b12---Dr.B       Cammie Sickle, MD 07/12/2021 12:01 PM

## 2021-07-16 ENCOUNTER — Ambulatory Visit (INDEPENDENT_AMBULATORY_CARE_PROVIDER_SITE_OTHER): Payer: Medicare Other | Admitting: Pharmacist

## 2021-07-16 DIAGNOSIS — E1169 Type 2 diabetes mellitus with other specified complication: Secondary | ICD-10-CM

## 2021-07-16 DIAGNOSIS — I10 Essential (primary) hypertension: Secondary | ICD-10-CM

## 2021-07-16 NOTE — Patient Instructions (Signed)
Visit Information ? ?Thank you for taking time to visit with me today. Please don't hesitate to contact me if I can be of assistance to you before our next scheduled telephone appointment. ? ?Following are the goals we discussed today:  ? Goals Addressed   ? ?  ?  ?  ?  ? This Visit's Progress  ?  Pharmacy Goals     ?  Our goal A1c is less than 7%. This corresponds with fasting sugars less than 130 and 2 hour after meal sugars less than 180. Please keep a log of your results when checking your blood sugar ? ?Our goal bad cholesterol, or LDL, is less than 70 . This is why it is important to continue taking your simvastatin ? ?Please check your home blood pressure, keep a log of the results and bring this with you to your medical appointments. ? ?Feel free to call me with any questions or concerns. I look forward to our next call! ? ?Wallace Cullens, PharmD, BCACP, CPP ?Clinical Pharmacist ?[facility] ?[place] ?[phone] ? ?  ? ?  ? ? ? ?Our next appointment is by telephone on 11/12/2021 at 10:45 am ? ?Please call the care guide team at [phone] if you need to cancel or reschedule your appointment.  ? ? ?Patient verbalizes understanding of instructions and care plan provided today and agrees to view in [place]. Active MyChart status confirmed with patient.   ? ?

## 2021-07-16 NOTE — Chronic Care Management (AMB) (Signed)
Chronic Care Management CCM Pharmacy Note  07/16/2021 Name:  Diana Harmon MRN:  [id number] DOB:  November 17, 1945   Subjective: Diana Harmon is an 76 y.o. year old female who is a primary patient of [name], DO.  The CCM team was consulted for assistance with disease management and care coordination needs.    Engaged with patient by telephone for follow up visit for pharmacy case management and/or care coordination services.   Objective:  Medications Reviewed Today     Reviewed by Rennis Petty, RPH-CPP (Pharmacist) on 07/16/21 at 1125  Med List Status: <None>   Medication Order Taking? Sig Documenting Provider Last Dose Status Informant  ACCU-CHEK GUIDE test strip [id number]  Use to check blood sugar twice daily [name], DO  Active   Accu-Chek Softclix Lancets lancets [id number]  Use to check blood sugar twice daily [name], DO  Active   acetaminophen (TYLENOL) 500 MG tablet [id number]  Take 1,000 mg by mouth every 6 (six) hours as needed for moderate pain.  [provider]  Active Child  Artificial Tear Solution (GENTEAL TEARS) 0.1-0.2-0.3 % SOLN [id number]  Place 1 drop into both eyes 3 (three) times daily as needed (dry/irritated eyes.). [provider]  Active Child  aspirin 81 MG chewable tablet [id number]  Chew 1 tablet (81 mg total) by mouth daily. [name]  Active Child  B COMPLEX-C PO [id number]  Take 10 mLs by mouth daily. [provider]  Active Child  baclofen (LIORESAL) 10 MG tablet [id number]  TAKE 1 TO 2 TABLETS BY MOUTH TWICE DAILY AS NEEDED FOR MUSCLE SPASM [name], Devonne Doughty, DO  Active   Blood Glucose Monitoring Suppl (ACCU-CHEK GUIDE) w/Device KIT [id number]  Use to check blood sugar twice daily [name], Devonne Doughty, DO  Active   Blood Glucose Monitoring Suppl (GLUCOCOM BLOOD GLUCOSE MONITOR) DEVI [id number]  Use to check blood sugar twice daily  [provider]  Active   cephALEXin (KEFLEX) 500 MG capsule [id number]    Patient not taking: Reported on 07/12/2021   [provider]  Active   Cholecalciferol (VITAMIN D3) 125 MCG (5000 UT) TABS [id number]  Take 5,000 Units by mouth daily. [provider]  Active Child    Discontinued 09/05/19 1316   cyanocobalamin 1000 MCG tablet [id number]  Take by mouth. [provider]  Active   Ferrous Sulfate (IRON) 325 (65 Fe) MG TABS [id number]  TAKE 1 TABLET BY MOUTH TWICE DAILY WITH A MEAL [name], Devonne Doughty, DO  Active Child  furosemide (LASIX) 20 MG tablet [id number] Yes Take 1 tablet (20 mg total) by mouth daily as needed for edema. End, Harrell Gave, MD Taking Active Child  hydrochlorothiazide (HYDRODIURIL) 25 MG tablet [id number] Yes Take 1 tablet (25 mg total) by mouth daily. Furth, Cadence H, PA-C Taking Active   hydroxychloroquine (PLAQUENIL) 200 MG tablet [id number]  Take 1 tablet by mouth 2 (two) times daily. [provider]  Active   lactobacillus acidophilus (BACID) TABS tablet [id number]  Take 1 tablet by mouth daily as needed (digestive health.). [provider]  Active            Med Note Nat Christen   Wed Nov 03, 2019  3:06 PM)    Lancets Misc. KIT [id number]  Use to check blood sugar twice daily [provider]  Active   levocetirizine (XYZAL) 5 MG tablet [id number]  TAKE 1 TABLET BY MOUTH ONCE DAILY IN THE  EVENING  Patient taking differently: Take 1/2 tablet (2.5 mg) daily in the evening as needed for allergies   Parks Ranger, Devonne Doughty, DO  Active   losartan (COZAAR) 50 MG tablet [id number] Yes Take 1 tablet (50 mg total) by mouth daily. Furth, Cadence H, PA-C Taking Active   magnesium oxide (MAG-OX) 400 MG tablet [id number]  Take 400 mg by mouth daily. [provider]  Active   simvastatin (ZOCOR) 20 MG tablet [id number]  TAKE 1 TABLET BY MOUTH ONCE DAILY AT  6  PM [name], Devonne Doughty, DO  Active    TRULICITY 1.94 RD/4.0CX SOPN [id number] Yes Inject 0.75 mg into the skin once a week. [name], DO Taking Active             Pertinent Labs:  Lab Results  Component Value Date   HGBA1C 6.7 (H) 04/20/2021   Lab Results  Component Value Date   CHOL 155 04/20/2021   HDL 65 04/20/2021   LDLCALC 72 04/20/2021   TRIG 95 04/20/2021   CHOLHDL 2.4 04/20/2021   Lab Results  Component Value Date   CREATININE 1.43 (H) 07/12/2021   BUN 34 (H) 07/12/2021   NA 139 07/12/2021   K 4.1 07/12/2021   CL 102 07/12/2021   CO2 28 07/12/2021    SDOH:  (Social Determinants of Health) assessments and interventions performed:    [place]  Review of patient past medical history, allergies, medications, health status, including review of consultants reports, laboratory and other test data, was performed as part of comprehensive evaluation and provision of chronic care management services.   Care Plan : PharmD - Medication Assistance/Management  Updates made by Rennis Petty, RPH-CPP since 07/16/2021 12:00 AM     Problem: Disease Progression      Long-Range Goal: Disease Progression Prevented or Minimized   Start Date: 10/30/2020  Expected End Date: 01/28/2021  Recent Progress: On track  Priority: High  Note:   Current Barriers:  Unable to independently afford treatment regimen Patient APPROVED for [facility] Patient Assistance Program for Trulicity, eligible from 11/21/20-05/05/22  Pharmacist Clinical Goal(s):  Over the next 90 days, patient will verbalize ability to afford treatment regimen through collaboration with PharmD and provider.   Interventions: 1:1 collaboration with [name], DO regarding development and update of comprehensive plan of care as evidenced by provider attestation and co-signature Inter-disciplinary care team collaboration (see longitudinal plan of care) Perform chart review. Patient seen for Office Visit with [facility] on 3/9 Per note review of notes from lab work on 3/9, provider advised patient: "vitamin B12 levels are high. I would recommend taking B12 pills once a week and we will repeat the blood work again the next visit" Patient states will review lab result message from provider in [place] and take Vitamin B12 accordingly  T2DM: Control improved based on latest A1C reading; current treatment: Trulicity 4.97 mg weekly Previous therapies: metformin Reports recent morning fasting blood sugar readings ranging: 107-142 Denies symptoms of hypoglycemia Reports doing well with having well balanced meals, while limiting carbohydrate content Encourage patient to continue to review nutrition labels for total carbohydrate content and serving size Cholesterol management: simvastatin 20 mg daily Exercise Reports exercise limited by arthritis.  Previously went to the pool, but stopped going due to COVID-19 pandemic Reports uses upright exercise bike, exercise from PT and/or yoga at home for 30 minutes x 3 days/week Encourage patient to continue to monitor home blood sugar, keep log  and bring record with her to medical appointments   HTN: Current treatment: HCTZ 25 mg daily Losartan 50 mg daily Furosemide 20 mg daily as needed for edema Reports recent blood pressure results:  AM BP PM BP  10 - March 124/51, HR 81 131/62, HR 87  11 - March - -  12 - March - 157/73, HR 82  13 - March 133/61, HR 83   Attributes elevated reading to having fried fish for supper last night Note patient has home upper arm blood pressure monitor Counsel on impact of salt/sodium in diet on blood pressure. Encourage patient to limit sodium intake Encouraged patient to monitor daily weights and follow direction from Cardiology for taking furosemide as needed  Encourage patient to bring record of daily weights and home BP readings with her to medical appointments, but to call provider sooner if readings outside  of established parameters   Medication Assistance: Collaborated with [place] to aid patient in reapply to [place] patient assistance program for 2023 calendar year From review of chart, note [facility] Sharee [facility] contacted [employer] in regard to patient's Trulicity re-enrollment application on 91/69/4503 and was advised patient APPROVED 05/06/21-05/05/22 and medication would begin to ship in January based on last fill date in 2022, to be delivered to patient's home Today patient confirms received shipment from assistance program   Patient Goals/Self-Care Activities Over the next 90 days, patient will:  - take medications as prescribed - check glucose, document, and provide at future appointments - check blood pressure, document, and provide at future appointments - collaborate with provider on medication access solutions - attend medical appointments as scheduled  Follow Up Plan: Telephone follow up appointment with care management team member scheduled for: 11/12/2021 at 10:45 am       Wallace Cullens, PharmD, Para [place], [place] [phone]

## 2021-08-03 DIAGNOSIS — E1169 Type 2 diabetes mellitus with other specified complication: Secondary | ICD-10-CM

## 2021-08-03 DIAGNOSIS — I10 Essential (primary) hypertension: Secondary | ICD-10-CM

## 2021-08-09 ENCOUNTER — Telehealth: Payer: Medicare Other

## 2021-08-09 ENCOUNTER — Telehealth: Payer: Self-pay

## 2021-08-09 NOTE — Telephone Encounter (Signed)
?  Care Management  ? ?Follow Up Note ? ? ?08/09/2021 ?Name: Diana Harmon MRN: [id number] DOB: 01/04/1946 ? ? ?Referred by: [name], DO ?Reason for referral : Chronic Care Management (RNCM: Follow up for Chronic Disease Management and Care Coordination Needs) ? ? ?An unsuccessful telephone outreach was attempted today. The patient was referred to the case management team for assistance with care management and care coordination.  ? ?Follow Up Plan: The care management team will reach out to the patient again over the next 30 days.  ? ?Noreene Larsson RN, MSN, CCM ?Community Care Coordinator ?[facility] ?[facility] ?Mobile: [phone]  ?

## 2021-08-10 ENCOUNTER — Ambulatory Visit (INDEPENDENT_AMBULATORY_CARE_PROVIDER_SITE_OTHER): Payer: Medicare Other

## 2021-08-10 DIAGNOSIS — E1169 Type 2 diabetes mellitus with other specified complication: Secondary | ICD-10-CM

## 2021-08-10 DIAGNOSIS — M159 Polyosteoarthritis, unspecified: Secondary | ICD-10-CM

## 2021-08-10 DIAGNOSIS — I10 Essential (primary) hypertension: Secondary | ICD-10-CM

## 2021-08-10 DIAGNOSIS — M19042 Primary osteoarthritis, left hand: Secondary | ICD-10-CM

## 2021-08-10 DIAGNOSIS — M19041 Primary osteoarthritis, right hand: Secondary | ICD-10-CM

## 2021-08-10 DIAGNOSIS — I5032 Chronic diastolic (congestive) heart failure: Secondary | ICD-10-CM

## 2021-08-10 DIAGNOSIS — M150 Primary generalized (osteo)arthritis: Secondary | ICD-10-CM

## 2021-08-10 NOTE — Patient Instructions (Signed)
Visit Information ? ?Thank you for taking time to visit with me today. Please don't hesitate to contact me if I can be of assistance to you before our next scheduled telephone appointment. ? ?Following are the goals we discussed today:  ?RNCM Clinical Goal(s):  ?Patient will verbalize understanding of plan for management of CHF, HTN, HLD, DMII, and Chronic Pain   ?verbalize basic understanding of CHF, HTN, HLD, DMII, Osteoarthritis, and Chronic Pain disease process and self health management plan  ?take all medications exactly as prescribed and will call provider for medication related questions ?demonstrate understanding of rationale for each prescribed medication  ?attend all scheduled medical appointments: 10-24-2021 at 0920 am ?demonstrate improved and ongoing adherence to prescribed treatment plan for CHF, HTN, HLD, DMII, Osteoarthritis, and Chronic Pain as evidenced by daily monitoring and recording of CBG  adherence to ADA/ carb modified diet adherence to prescribed medication regimen contacting provider for new or worsened symptoms or questions  ?demonstrate improved and ongoing health management independence  ?demonstrate a decrease in CHF, HTN, HLD, DMII, Osteoarthritis, and chronic pain exacerbations  ?demonstrate ongoing self health care management ability of effective management of chronic conditions through collaboration with RN Care manager, provider, and care team.  ?  ?Interventions: ?1:1 collaboration with primary care provider regarding development and update of comprehensive plan of care as evidenced by provider attestation and co-signature ?Inter-disciplinary care team collaboration (see longitudinal plan of care) ?Evaluation of current treatment plan related to  self management and patient's adherence to plan as established by provider ?  ?  ?SDOH Barriers (Status: Goal on track: YES.)  ?Patient interviewed and SDOH assessment performed ?       ?SDOH Interventions   ?  ?Flowsheet Row Most  Recent Value  ?SDOH Interventions    ?Food Insecurity Interventions Intervention Not Indicated  ?Financial Strain Interventions Intervention Not Indicated  ?Housing Interventions Intervention Not Indicated  ?Intimate Partner Violence Interventions Intervention Not Indicated  ?Physical Activity Interventions Other (Comments)  [The patient is doing exercises the PT has showed her]  ?Stress Interventions Intervention Not Indicated  ?Social Connections Interventions Other (Comment)  [good support system]  ?Transportation Interventions Intervention Not Indicated  ?  ?   ?Patient interviewed and appropriate assessments performed ?Provided patient with information about resources available for changes in SDOH and care guides available for any needs or resources in [facility] ?Discussed plans with patient for ongoing care management follow up and provided patient with direct contact information for care management team ?Advised patient to call the office for changes in SDOH, questions, or concerns ?  ?  ?  ?Heart Failure Interventions:  (Status: Goal on track: YES.) ?Basic overview and discussion of pathophysiology of Heart Failure reviewed; ?Provided education on low sodium diet. 08-10-2021: The patient is eating better and incorporating more vegetables in her diet. She is a Teaching laboratory technician ; ?Reviewed Heart Failure Action Plan in depth and provided written copy; ?Assessed need for readable accurate scales in home; ?Provided education about placing scale on hard, flat surface; ?Advised patient to weigh each morning after emptying bladder; ?Discussed importance of daily weight and advised patient to weigh and record daily. 08-10-2021: She took a fluid pill today because she has extra fluid on board. Education on monitoring for weight gain and talked about water weight versus actual weight; ?Reviewed role of diuretics in prevention of fluid overload and management of heart failure. 08-10-2021: The patient is compliant with  medications; ?Discussed the importance of keeping all appointments  with provider. 10-24-2021 at 0920 am ?Provided patient with education about the role of exercise in the management of heart failure; ?  ?Diabetes:  (Status: Goal on track: YES.) ?     ?Lab Results  ?Component Value Date  ?  HGBA1C 6.7 (H) 04/20/2021  ?Assessed patient's understanding of A1c goal: <7% ?Provided education to patient about basic DM disease process; ?Reviewed medications with patient and discussed importance of medication adherence. 03-05-2021: Incoming call from the patient asking if she could get the contact information for the pharm D. She is not out of her Trulicity but will need assistance with the refill. Number provided. Will also send an inbasket message to pharm D for follow up. 08-10-2021: Is working with the pharm D for ongoing support and management of medications;        ?Reviewed prescribed diet with patient heart healthy/ADA diet. 08-10-2021: Is monitoring her dietary intake and is compliant with heart healthy/ADA diet. She is eating more vegetables and doing better with her dietary  restrictions ; ?Counseled on importance of regular laboratory monitoring as prescribed;        ?Discussed plans with patient for ongoing care management follow up and provided patient with direct contact information for care management team;      ?Provided patient with written educational materials related to hypo and hyperglycemia and importance of correct treatment. 04-05-2021: The patient states last week she had a low of 64. States she was asymptomatic. She states she did not feel worse. Education on making sure when blood sugars are that low to eat well. Review of sx and sx of hypoglycemia and hyperglycemia. 08-10-2021: The patient denies any lows or highs.        ?Reviewed scheduled/upcoming provider appointments including: 10-24-2021 0920 am         ?Advised patient, providing education and rationale, to check cbg as directed and record.  02-22-2021: The patient states that her glucose readings have been good. 120-162 with the highest in the last two weeks of 147. 04-05-2021: The patient provided several readings of her blood sugars. Range has been 64 to 146. Denies any acute findings. Has her log to take with her to the provider office at upcoming visit.  08-10-2021: The patient states her blood sugar this am at 102. She says her blood sugars are doing well. She is happy that they are doing so good.        ?call provider for findings outside established parameters;       ?Referral made to pharmacy team for assistance with ongoing support and education with medication management;       ?Review of patient status, including review of consultants reports, relevant laboratory and other test results, and medications completed;       ?Screening for signs and symptoms of depression related to chronic disease state;        ?Assessed social determinant of health barriers;       ?Last eye exam on 04-19-2021. No evidence of diabetic neuropathy. Has a cataract that has not matured yet that they are monitoring.   ?  ?Hyperlipidemia:  (Status: Goal on track: YES.) ?     ?Lab Results  ?Component Value Date  ?  CHOL 155 04/20/2021  ?  HDL 65 04/20/2021  ?  [place] 72 04/20/2021  ?  TRIG 95 04/20/2021  ?  CHOLHDL 2.4 04/20/2021  ?  ?  ?Medication review performed; medication list updated in electronic medical record. 08-10-2021: The patient is compliant  with medications. Works with the [place] [facility] as well.  ?Provider established cholesterol goals reviewed. 08-10-2021: The patient is at goal, praised for being at goal. ?Counseled on importance of regular laboratory monitoring as prescribed. 08-10-2021: The patient has regular blood work done.  ?Provided HLD educational materials; ?Reviewed role and benefits of statin for ASCVD risk reduction; ?Discussed strategies to manage statin-induced myalgias; ?Reviewed importance of limiting foods high in cholesterol. 08-10-2021: Is compliant  with heart healthy/ADA diet. The patient states that she is doing well with her diet and much better eating habits; ?  ?Hypertension: (Status: Goal on track: YES.) ?Last practice recorded BP readings:  ?   ?BP Ulyess Mort

## 2021-08-10 NOTE — Chronic Care Management (AMB) (Signed)
?Chronic Care Management  ? ?CCM RN Visit Note ? ?08/10/2021 ?Name: Diana Harmon MRN: [id number] DOB: Apr 02, 1946 ? ?Subjective: ?Diana Harmon is a 76 y.o. year old female who is a primary care patient of [name], DO. The care management team was consulted for assistance with disease management and care coordination needs.   ? ?Engaged with patient by telephone for follow up visit in response to provider referral for case management and/or care coordination services.  ? ?Consent to Services:  ?The patient was given information about Chronic Care Management services, agreed to services, and gave verbal consent prior to initiation of services.  Please see initial visit note for detailed documentation.  ? ?Patient agreed to services and verbal consent obtained.  ? ?Assessment: Review of patient past medical history, allergies, medications, health status, including review of consultants reports, laboratory and other test data, was performed as part of comprehensive evaluation and provision of chronic care management services.  ? ?SDOH (Social Determinants of Health) assessments and interventions performed:   ? ?CCM Care Plan ? ?Allergies  ?Allergen Reactions  ? Ferumoxytol Swelling  ?  Swelling in neck ?Swelling in neck ?Swelling in neck  ? Penicillins Rash, Swelling and Hives  ? Iron   ? Other Other (See Comments)  ?  Other reaction(s): Other (See Comments) ?She refuses blood products-Jehovah witness ?She refuses blood products-Jehovah witness ?Other reaction(s): Other (See Comments) ?She refuses blood products-Jehovah witness  ? ? ?Outpatient Encounter Medications as of 08/10/2021  ?Medication Sig  ? ACCU-CHEK GUIDE test strip Use to check blood sugar twice daily  ? Accu-Chek Softclix Lancets lancets Use to check blood sugar twice daily  ? acetaminophen (TYLENOL) 500 MG tablet Take 1,000 mg by mouth every 6 (six) hours as needed for moderate pain.   ? Artificial Tear Solution (GENTEAL  TEARS) 0.1-0.2-0.3 % SOLN Place 1 drop into both eyes 3 (three) times daily as needed (dry/irritated eyes.).  ? aspirin 81 MG chewable tablet Chew 1 tablet (81 mg total) by mouth daily.  ? B COMPLEX-C PO Take 10 mLs by mouth daily.  ? baclofen (LIORESAL) 10 MG tablet TAKE 1 TO 2 TABLETS BY MOUTH TWICE DAILY AS NEEDED FOR MUSCLE SPASM  ? Blood Glucose Monitoring Suppl (ACCU-CHEK GUIDE) w/Device KIT Use to check blood sugar twice daily  ? Blood Glucose Monitoring Suppl (GLUCOCOM BLOOD GLUCOSE MONITOR) DEVI Use to check blood sugar twice daily  ? cephALEXin (KEFLEX) 500 MG capsule  (Patient not taking: Reported on 07/12/2021)  ? Cholecalciferol (VITAMIN D3) 125 MCG (5000 UT) TABS Take 5,000 Units by mouth daily.  ? cyanocobalamin 1000 MCG tablet Take by mouth.  ? Ferrous Sulfate (IRON) 325 (65 Fe) MG TABS TAKE 1 TABLET BY MOUTH TWICE DAILY WITH A MEAL  ? furosemide (LASIX) 20 MG tablet Take 1 tablet (20 mg total) by mouth daily as needed for edema.  ? hydrochlorothiazide (HYDRODIURIL) 25 MG tablet Take 1 tablet (25 mg total) by mouth daily.  ? hydroxychloroquine (PLAQUENIL) 200 MG tablet Take 1 tablet by mouth 2 (two) times daily.  ? lactobacillus acidophilus (BACID) TABS tablet Take 1 tablet by mouth daily as needed (digestive health.).  ? Lancets Misc. KIT Use to check blood sugar twice daily  ? levocetirizine (XYZAL) 5 MG tablet TAKE 1 TABLET BY MOUTH ONCE DAILY IN THE EVENING (Patient taking differently: Take 1/2 tablet (2.5 mg) daily in the evening as needed for allergies)  ? losartan (COZAAR) 50 MG tablet Take 1 tablet (50  mg total) by mouth daily.  ? magnesium oxide (MAG-OX) 400 MG tablet Take 400 mg by mouth daily.  ? simvastatin (ZOCOR) 20 MG tablet TAKE 1 TABLET BY MOUTH ONCE DAILY AT  6  PM  ? TRULICITY 0.96 GE/3.6OQ SOPN Inject 0.75 mg into the skin once a week.  ? [DISCONTINUED] Cromolyn Sodium (NASAL ALLERGY NA) Place 1 spray into the nose daily as needed (allergies).  ? ?No facility-administered encounter  medications on file as of 08/10/2021.  ? ? ?Patient Active Problem List  ? Diagnosis Date Noted  ? Protrusion of intervertebral disc of thoracic region 02/19/2021  ? Sjogren's syndrome with inflammatory arthritis ([place]) 02/19/2021  ? S/P TAVR (transcatheter aortic valve replacement) 11/09/2019  ? Patient is [name]   ? Severe aortic stenosis   ? Thyroid disease   ? Chronic heart failure with preserved ejection fraction (HFpEF) ([place]) 09/30/2019  ? 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/22/2019  ? Arthralgia 04/22/2019  ? Normocytic anemia 02/17/2019  ? Primary osteoarthritis of right knee 02/06/2018  ? Morbid obesity ([place]) 11/05/2017  ? Neutropenia ([place]) 04/24/2017  ? Vitamin D deficiency 10/08/2016  ? Meniere's disease of right ear 07/04/2016  ? TIA (transient ischemic attack) 07/04/2016  ? Nonrheumatic aortic valve stenosis 07/04/2016  ? Seasonal allergies 01/30/2016  ? Recurrent UTI 10/26/2015  ? Iron deficiency anemia due to chronic blood loss 02/28/2015  ? History of adenomatous polyp of colon 02/13/2015  ? Osteoarthritis of both knees 12/05/2014  ? Acid reflux 12/05/2014  ? Benign neoplasm of colon 12/05/2014  ? Type 2 diabetes mellitus with other specified complication ([place]) 94/76/5465  ? Essential hypertension 12/05/2014  ? Hyperlipidemia associated with type 2 diabetes mellitus ([place]) 12/05/2014  ? Arthritis, degenerative 12/05/2014  ? Anemia due to multiple mechanisms 12/05/2014  ? Abnormal liver enzymes 12/05/2014  ? Anemia 12/05/2014  ? ? ?Conditions to be addressed/monitored:CHF, HTN, HLD, DMII, and Chronic pain ? ?Care Plan : RNCM; General Plan of Care (Adult) for Chronic Disease Management and Care Coordination Needs  ?Updates made by Vanita Ingles, RN since 08/10/2021 12:00 AM  ?  ? ?Problem: RNCM: Development of Plan of care for Chronic Disease Management (HTN/HLD/HF/DM/Chronic pain)   ?Priority: High  ?  ? ?Long-Range Goal: RNCM: Effective Management of Plan of care for Chronic  Disease Management (HTN/HLD/HF/DM/Chronic pain)   ?Start Date: 02/22/2021  ?Expected End Date: 02/22/2022  ?Priority: High  ?Note:   ?Current Barriers:  ?Knowledge Deficits related to plan of care for management of CHF, HTN, HLD, DMII, and Chronic pain   ?Chronic Disease Management support and education needs related to CHF, HTN, HLD, DMII, and Chronic Pain ? ?RNCM Clinical Goal(s):  ?Patient will verbalize understanding of plan for management of CHF, HTN, HLD, DMII, and Chronic Pain   ?verbalize basic understanding of CHF, HTN, HLD, DMII, Osteoarthritis, and Chronic Pain disease process and self health management plan  ?take all medications exactly as prescribed and will call provider for medication related questions ?demonstrate understanding of rationale for each prescribed medication  ?attend all scheduled medical appointments: 10-24-2021 at 0920 am ?demonstrate improved and ongoing adherence to prescribed treatment plan for CHF, HTN, HLD, DMII, Osteoarthritis, and Chronic Pain as evidenced by daily monitoring and recording of CBG  adherence to ADA/ carb modified diet adherence to prescribed medication regimen contacting provider for new or worsened symptoms or questions  ?demonstrate improved and ongoing health management independence  ?demonstrate a decrease in CHF, HTN, HLD, DMII, Osteoarthritis, and chronic pain  exacerbations  ?demonstrate ongoing self health care management ability of effective management of chronic conditions through collaboration with RN Care manager, provider, and care team.  ? ?Interventions: ?1:1 collaboration with primary care provider regarding development and update of comprehensive plan of care as evidenced by provider attestation and co-signature ?Inter-disciplinary care team collaboration (see longitudinal plan of care) ?Evaluation of current treatment plan related to  self management and patient's adherence to plan as established by provider ? ? ?SDOH Barriers (Status: Goal on  track: YES.)  ?Patient interviewed and SDOH assessment performed ?       ?SDOH Interventions   ? ?Flowsheet Row Most Recent Value  ?SDOH Interventions   ?Food Insecurity Interventions Intervention Not

## 2021-09-02 DIAGNOSIS — E785 Hyperlipidemia, unspecified: Secondary | ICD-10-CM | POA: Diagnosis not present

## 2021-09-02 DIAGNOSIS — M19042 Primary osteoarthritis, left hand: Secondary | ICD-10-CM | POA: Diagnosis not present

## 2021-09-02 DIAGNOSIS — I5032 Chronic diastolic (congestive) heart failure: Secondary | ICD-10-CM

## 2021-09-02 DIAGNOSIS — Z7984 Long term (current) use of oral hypoglycemic drugs: Secondary | ICD-10-CM

## 2021-09-02 DIAGNOSIS — I110 Hypertensive heart disease with heart failure: Secondary | ICD-10-CM | POA: Diagnosis not present

## 2021-09-02 DIAGNOSIS — E119 Type 2 diabetes mellitus without complications: Secondary | ICD-10-CM | POA: Diagnosis not present

## 2021-09-02 DIAGNOSIS — M19041 Primary osteoarthritis, right hand: Secondary | ICD-10-CM

## 2021-09-02 DIAGNOSIS — Z87891 Personal history of nicotine dependence: Secondary | ICD-10-CM | POA: Diagnosis not present

## 2021-09-24 ENCOUNTER — Telehealth: Payer: Medicare Other

## 2021-09-24 ENCOUNTER — Ambulatory Visit (INDEPENDENT_AMBULATORY_CARE_PROVIDER_SITE_OTHER): Payer: Medicare Other

## 2021-09-24 DIAGNOSIS — E1169 Type 2 diabetes mellitus with other specified complication: Secondary | ICD-10-CM

## 2021-09-24 DIAGNOSIS — I5032 Chronic diastolic (congestive) heart failure: Secondary | ICD-10-CM

## 2021-09-24 DIAGNOSIS — I10 Essential (primary) hypertension: Secondary | ICD-10-CM

## 2021-09-24 DIAGNOSIS — M19042 Primary osteoarthritis, left hand: Secondary | ICD-10-CM

## 2021-09-24 DIAGNOSIS — M170 Bilateral primary osteoarthritis of knee: Secondary | ICD-10-CM

## 2021-09-24 NOTE — Chronic Care Management (AMB) (Signed)
Chronic Care Management   CCM RN Visit Note  09/24/2021 Name: Diana Harmon MRN: [id number] DOB: 1945-05-09  Subjective: Diana Harmon is a 76 y.o. year old female who is a primary care patient of [name], DO. The care management team was consulted for assistance with disease management and care coordination needs.    Engaged with patient by telephone for follow up visit in response to provider referral for case management and/or care coordination services.   Consent to Services:  The patient was given information about Chronic Care Management services, agreed to services, and gave verbal consent prior to initiation of services.  Please see initial visit note for detailed documentation.   Patient agreed to services and verbal consent obtained.   Assessment: Review of patient past medical history, allergies, medications, health status, including review of consultants reports, laboratory and other test data, was performed as part of comprehensive evaluation and provision of chronic care management services.   SDOH (Social Determinants of Health) assessments and interventions performed:    CCM Care Plan  Allergies  Allergen Reactions   Ferumoxytol Swelling    Swelling in neck Swelling in neck Swelling in neck   Penicillins Rash, Swelling and Hives   Iron    Other Other (See Comments)    Other reaction(s): Other (See Comments) She refuses blood products-Jehovah witness She refuses blood products-Jehovah witness Other reaction(s): Other (See Comments) She refuses blood products-Jehovah witness    Outpatient Encounter Medications as of 09/24/2021  Medication Sig   ACCU-CHEK GUIDE test strip Use to check blood sugar twice daily   Accu-Chek Softclix Lancets lancets Use to check blood sugar twice daily   acetaminophen (TYLENOL) 500 MG tablet Take 1,000 mg by mouth every 6 (six) hours as needed for moderate pain.    Artificial Tear Solution  (GENTEAL TEARS) 0.1-0.2-0.3 % SOLN Place 1 drop into both eyes 3 (three) times daily as needed (dry/irritated eyes.).   aspirin 81 MG chewable tablet Chew 1 tablet (81 mg total) by mouth daily.   B COMPLEX-C PO Take 10 mLs by mouth daily.   baclofen (LIORESAL) 10 MG tablet TAKE 1 TO 2 TABLETS BY MOUTH TWICE DAILY AS NEEDED FOR MUSCLE SPASM   Blood Glucose Monitoring Suppl (ACCU-CHEK GUIDE) w/Device KIT Use to check blood sugar twice daily   Blood Glucose Monitoring Suppl (GLUCOCOM BLOOD GLUCOSE MONITOR) DEVI Use to check blood sugar twice daily   cephALEXin (KEFLEX) 500 MG capsule  (Patient not taking: Reported on 07/12/2021)   Cholecalciferol (VITAMIN D3) 125 MCG (5000 UT) TABS Take 5,000 Units by mouth daily.   cyanocobalamin 1000 MCG tablet Take by mouth.   Ferrous Sulfate (IRON) 325 (65 Fe) MG TABS TAKE 1 TABLET BY MOUTH TWICE DAILY WITH A MEAL   furosemide (LASIX) 20 MG tablet Take 1 tablet (20 mg total) by mouth daily as needed for edema.   hydrochlorothiazide (HYDRODIURIL) 25 MG tablet Take 1 tablet (25 mg total) by mouth daily.   hydroxychloroquine (PLAQUENIL) 200 MG tablet Take 1 tablet by mouth 2 (two) times daily.   lactobacillus acidophilus (BACID) TABS tablet Take 1 tablet by mouth daily as needed (digestive health.).   Lancets Misc. KIT Use to check blood sugar twice daily   levocetirizine (XYZAL) 5 MG tablet TAKE 1 TABLET BY MOUTH ONCE DAILY IN THE EVENING (Patient taking differently: Take 1/2 tablet (2.5 mg) daily in the evening as needed for allergies)   losartan (COZAAR) 50 MG tablet Take 1 tablet (50  mg total) by mouth daily.   magnesium oxide (MAG-OX) 400 MG tablet Take 400 mg by mouth daily.   simvastatin (ZOCOR) 20 MG tablet TAKE 1 TABLET BY MOUTH ONCE DAILY AT  6  PM   TRULICITY 3.41 PF/7.9KW SOPN Inject 0.75 mg into the skin once a week.   [DISCONTINUED] Cromolyn Sodium (NASAL ALLERGY NA) Place 1 spray into the nose daily as needed (allergies).   No facility-administered  encounter medications on file as of 09/24/2021.    Patient Active Problem List   Diagnosis Date Noted   Protrusion of intervertebral disc of thoracic region 02/19/2021   Sjogren's syndrome with inflammatory arthritis ([place]) 02/19/2021   S/P TAVR (transcatheter aortic valve replacement) 11/09/2019   Patient is [name]    Severe aortic stenosis    Thyroid disease    Chronic heart failure with preserved ejection fraction (HFpEF) ([place]) 40/97/3532   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/22/2019   Arthralgia 04/22/2019   Normocytic anemia 02/17/2019   Primary osteoarthritis of right knee 02/06/2018   Morbid obesity ([place]) 11/05/2017   Neutropenia ([place]) 04/24/2017   Vitamin D deficiency 10/08/2016   Meniere's disease of right ear 07/04/2016   TIA (transient ischemic attack) 07/04/2016   Nonrheumatic aortic valve stenosis 07/04/2016   Seasonal allergies 01/30/2016   Recurrent UTI 10/26/2015   Iron deficiency anemia due to chronic blood loss 02/28/2015   History of adenomatous polyp of colon 02/13/2015   Osteoarthritis of both knees 12/05/2014   Acid reflux 12/05/2014   Benign neoplasm of colon 12/05/2014   Type 2 diabetes mellitus with other specified complication ([place]) 99/24/2683   Essential hypertension 12/05/2014   Hyperlipidemia associated with type 2 diabetes mellitus ([place]) 12/05/2014   Arthritis, degenerative 12/05/2014   Anemia due to multiple mechanisms 12/05/2014   Abnormal liver enzymes 12/05/2014   Anemia 12/05/2014    Conditions to be addressed/monitored:CHF, HTN, HLD, DMII, and Chronic Pain  Care Plan : RNCM; General Plan of Care (Adult) for Chronic Disease Management and Care Coordination Needs  Updates made by Vanita Ingles, RN since 09/24/2021 12:00 AM     Problem: RNCM: Development of Plan of care for Chronic Disease Management (HTN/HLD/HF/DM/Chronic pain)   Priority: High     Long-Range Goal: RNCM: Effective Management of Plan of care  for Chronic Disease Management (HTN/HLD/HF/DM/Chronic pain)   Start Date: 02/22/2021  Expected End Date: 02/22/2022  Priority: High  Note:   Current Barriers:  Knowledge Deficits related to plan of care for management of CHF, HTN, HLD, DMII, and Chronic pain   Chronic Disease Management support and education needs related to CHF, HTN, HLD, DMII, and Chronic Pain  RNCM Clinical Goal(s):  Patient will verbalize understanding of plan for management of CHF, HTN, HLD, DMII, and Chronic Pain   verbalize basic understanding of CHF, HTN, HLD, DMII, Osteoarthritis, and Chronic Pain disease process and self health management plan  take all medications exactly as prescribed and will call provider for medication related questions demonstrate understanding of rationale for each prescribed medication  attend all scheduled medical appointments: 10-24-2021 at 0920 am demonstrate improved and ongoing adherence to prescribed treatment plan for CHF, HTN, HLD, DMII, Osteoarthritis, and Chronic Pain as evidenced by daily monitoring and recording of CBG  adherence to ADA/ carb modified diet adherence to prescribed medication regimen contacting provider for new or worsened symptoms or questions  demonstrate improved and ongoing health management independence  demonstrate a decrease in CHF, HTN, HLD, DMII, Osteoarthritis, and chronic pain  exacerbations  demonstrate ongoing self health care management ability of effective management of chronic conditions through collaboration with RN Care manager, provider, and care team.   Interventions: 1:1 collaboration with primary care provider regarding development and update of comprehensive plan of care as evidenced by provider attestation and co-signature Inter-disciplinary care team collaboration (see longitudinal plan of care) Evaluation of current treatment plan related to  self management and patient's adherence to plan as established by provider   SDOH Barriers  (Status: Goal on track: YES.)  Patient interviewed and SDOH assessment performed        SDOH Interventions    Flowsheet Row Most Recent Value  SDOH Interventions   Food Insecurity Interventions Intervention Not Indicated  Financial Strain Interventions Intervention Not Indicated  Housing Interventions Intervention Not Indicated  Intimate Partner Violence Interventions Intervention Not Indicated  Physical Activity Interventions Other (Comments)  [The patient is doing exercises the PT has showed her]  Stress Interventions Intervention Not Indicated  Social Connections Interventions Other (Comment)  [good support system]  Transportation Interventions Intervention Not Indicated     Patient interviewed and appropriate assessments performed Provided patient with information about resources available for changes in SDOH and care guides available for any needs or resources in [facility] Discussed plans with patient for ongoing care management follow up and provided patient with direct contact information for care management team Advised patient to call the office for changes in [place], questions, or concerns    Heart Failure Interventions:  (Status: Goal on track: YES.) Basic overview and discussion of pathophysiology of Heart Failure reviewed; Provided education on low sodium diet. 09-24-2021: The patient is eating better and incorporating more vegetables in her diet. She is a Teaching laboratory technician. The patient continues to choose healthy eating options and is drinking more juices that are good for her for kidney protections and management of chronic conditions.  Reviewed Heart Failure Action Plan in depth and provided written copy. 09-24-2021: Review and education. The patient sees her providers on a regular basis. ; Assessed need for readable accurate scales in home; Provided education about placing scale on hard, flat surface; Advised patient to weigh each morning after emptying  bladder; Discussed importance of daily weight and advised patient to weigh and record daily. 08-10-2021: She took a fluid pill today because she has extra fluid on board. Education on monitoring for weight gain and talked about water weight versus actual weight. 09-24-2021: Denies any issues with HF exacerbations at this time. States her conditions are stable.  Reviewed role of diuretics in prevention of fluid overload and management of heart failure. 09-24-2021: The patient is compliant with medications; Discussed the importance of keeping all appointments with provider. 10-24-2021 at 0920 am Provided patient with education about the role of exercise in the management of heart failure;  Diabetes:  (Status: Goal on track: YES.) Lab Results  Component Value Date   HGBA1C 6.7 (H) 04/20/2021  09-24-2021: Will have upcoming blood work in June at [place] patient's understanding of A1c goal: <7%. 09-24-2021: The patient knows goals of care and is at goal with A1C. Will have new A1C in June.  Provided education to patient about basic DM disease process; Reviewed medications with patient and discussed importance of medication adherence. 03-05-2021: Incoming call from the patient asking if she could get the contact information for the pharm D. She is not out of her Trulicity but will need assistance with the refill. Number provided. Will also send an inbasket message  to pharm D for follow up. 09-24-2021: Is working with the pharm D for ongoing support and management of medications;        Reviewed prescribed diet with patient heart healthy/ADA diet. 09-24-2021: Is monitoring her dietary intake and is compliant with heart healthy/ADA diet. She is eating more vegetables and doing better with her dietary  restrictions. Is happy that she is eating healthier and feeling good; Counseled on importance of regular laboratory monitoring as prescribed. 09-24-2021: The patient has regular labs done.        Discussed  plans with patient for ongoing care management follow up and provided patient with direct contact information for care management team;      Provided patient with written educational materials related to hypo and hyperglycemia and importance of correct treatment. 04-05-2021: The patient states last week she had a low of 64. States she was asymptomatic. She states she did not feel worse. Education on making sure when blood sugars are that low to eat well. Review of sx and sx of hypoglycemia and hyperglycemia. 5-22--2023: The patient denies any lows or highs.        Reviewed scheduled/upcoming provider appointments including: 10-24-2021 0920 am         Advised patient, providing education and rationale, to check cbg as directed and record. 02-22-2021: The patient states that her glucose readings have been good. 120-162 with the highest in the last two weeks of 147. 04-05-2021: The patient provided several readings of her blood sugars. Range has been 64 to 146. Denies any acute findings. Has her log to take with her to the provider office at upcoming visit.  08-10-2021: The patient states her blood sugar this am at 102. She says her blood sugars are doing well. She is happy that they are doing so good.   09-24-2021: The patient states that her blood sugars are staying below 120 for the most part. She has seen 134 and 137 but not that consistently.    call provider for findings outside established parameters;       Referral made to pharmacy team for assistance with ongoing support and education with medication management;       Review of patient status, including review of consultants reports, relevant laboratory and other test results, and medications completed;       Screening for signs and symptoms of depression related to chronic disease state;        Assessed social determinant of health barriers;       Last eye exam on 04-19-2021. No evidence of diabetic neuropathy. Has a cataract that has not matured yet  that they are monitoring.    Hyperlipidemia:  (Status: Goal on track: YES.) Lab Results  Component Value Date   CHOL 155 04/20/2021   HDL 65 04/20/2021   LDLCALC 72 04/20/2021   TRIG 95 04/20/2021   CHOLHDL 2.4 04/20/2021     Medication review performed; medication list updated in electronic medical record. 09-24-2021: The patient is compliant with medications. Works with the [facility] D as well.  Provider established cholesterol goals reviewed. 09-24-2021: The patient is at goal, praised for being at goal. Counseled on importance of regular laboratory monitoring as prescribed. 09-24-2021: The patient has regular blood work done.  Provided HLD educational materials; Reviewed role and benefits of statin for ASCVD risk reduction; Discussed strategies to manage statin-induced myalgias; Reviewed importance of limiting foods high in cholesterol. 09-24-2021: Is compliant with heart healthy/ADA diet. The patient states that she is doing  well with her diet and much better eating habits;  Hypertension: (Status: Goal on track: YES.) Last practice recorded BP readings:  BP Readings from Last 3 Encounters:  07/12/21 (!) 130/56  06/06/21 138/62  05/18/21 (!) 148/70  06-07-2021: Self reported blood pressure from today's reading is 136/71 with a pulse of 78 08-10-2021: Self reported 139/62, 150/72, and 126/56 09-24-2021: Self reported this am 130/56 with pulse of 86 Most recent eGFR/CrCl: No results found for: EGFR  No components found for: CRCL  Evaluation of current treatment plan related to hypertension self management and patient's adherence to plan as established by provider. 06-07-2021: The patient is compliant with plan of care for HTN and maintenance of Heart health. She saw the pcp in December and saw the cardiologist yesterday. She had a good report and no changes in her medications regimen. 09-24-2021: The patient denies any issues with medications or management of HTN or heart health. The patient is  consistently having stable blood pressures;   Provided education to patient re: stroke prevention, s/s of heart attack and stroke; Reviewed prescribed diet heart healthy/ADA. 09-24-2021: Review of heart healthy/ADA diet. The patient is doing well with dietary restrictions. Is being more mindful of what she eats and drinks.  Reviewed medications with patient and discussed importance of compliance. 09-24-2021: The patient is compliant with medications. Has follow up with the pcp in June;  Discussed plans with patient for ongoing care management follow up and provided patient with direct contact information for care management team; Advised patient, providing education and rationale, to monitor blood pressure daily and record, calling PCP for findings outside established parameters. 04-05-2021: The patient is checking her blood pressures daily. The patient denies any issues with blood pressures. Systolic range is 308 to 657 and diastolic 84-69. Review of goal of systolic of <629 and diastolic <52. The patient denies any headaches or other issues related to possible elevation in blood pressures. 06-07-2021: The patients self reported blood pressure today was 136/64 with a pulse of 78. She is doing well. 08-10-2021: Self reported 139/62, 150/72 and 126/56.  09-24-2021: Her blood pressure today was 130/56 with pulse of 86 Reviewed scheduled/upcoming provider appointments including: 10-24-2021 at 0920am Provided education on prescribed diet heart healthy/ADA ;  Discussed complications of poorly controlled blood pressure such as heart disease, stroke, circulatory complications, vision complications, kidney impairment, sexual dysfunction;   Pain, OA, Sjogren's syndrome:  (Status: Goal on track: YES.) Pain assessment performed. 04-05-2021: The patient states her pain level today is 5 and she is tolerating her pain much better. 06-07-2021: The patient denies pain today. States that she is not having to wear the braces as much  and the exercises have helped a lot. 08-10-2021: Rates her knee pain at a 7 today and her neck pain at a 5 today. Says the weather is likely causing some of the issues. Education and support given. 09-24-2021: Denies any pain today. States she is continuing to do her exercises she learned from PT Medications reviewed. 02-22-2021: Patient has been started on Plaquinel 200 mg BID. The patient takes Tylenol for OA pain and discomfort. 09-24-2021 The patient is compliant with medications, has ongoing support from the pharm D.  Reviewed provider established plan for pain management. 02-22-2021: The patient saw the specialist on Monday. She is going to take the Planquinel and see how that works. She has been working with PT and has strengthening exercises to do. She states that they may do more testing but are going to  see how this works for now. She has been diagnosed with Sjogren's syndrome.  Also she has carpal tunnel syndrome and OA. 04-05-2021: The patient goes tomorrow to the specialist at 2 pm to follow up with the pain and numbness in her hands. She states she is doing much better with the exercises she has been doing and the recommendations given to her by the provider. 06-07-2021: The patient continues to do her exercises and other things and it is really helping her pain in her hands. She said the doctor told her they could do injections but she does not wish to do that.  Discussed importance of adherence to all scheduled medical appointments. Next pcp appointment on 10-24-2021 at 0920 am.  Counseled on the importance of reporting any/all new or changed pain symptoms or management strategies to pain management provider; Advised patient to report to care team affect of pain on daily activities; Discussed use of relaxation techniques and/or diversional activities to assist with pain reduction (distraction, imagery, relaxation, massage, acupressure, TENS, heat, and cold application; Reviewed with patient  prescribed pharmacological and nonpharmacological pain relief strategies; Advised patient to discuss unresolved pain or changes in level or intensity of pain with provider; Screening for signs and symptoms of depression related to chronic disease state;  Assessed social determinant of health barriers;    Questionable Abscess to back  (Status: Goal Met.) Short Term Goal  Evaluation of current treatment plan related to  Possible abscess to back  ,  self-management and patient's adherence to plan as established by provider. Discussed plans with patient for ongoing care management follow up and provided patient with direct contact information for care management team Advised patient to call the office for changes in the area on her back, for sx and sx of infection, and to call the office for a sooner appointment if need for evaluation on what the patient believes to be an abscess on her back. Many years ago she had something similar to this on her back in the same area and it had to be drained. ; Provided education to patient re: sx and sx of infection, monitoring for chills, elevation in body temperature, increased redness to the area, pustulant drainage from the area; Reviewed scheduled/upcoming provider appointments including 04-25-2021 at [place] am; Advised patient to discuss area to the back that is sore with a little redness. with provider;  Collaboration with pcp about the patients concern about area to her back she calls a "possible abscess". The patient had something similar in the same area several years ago and it had to have surgical intervention to be drained. The patient states this are is sore and per her daughter a little red. The patient has upcoming appointment with pcp. Advised the patient to monitor for sx and sx of infection and to call for a sooner appointment if needed. 06-07-2021: The patient was evaluated at the pcp visit on 04-25-2022 and there were not findings with infection or new  concerns for cyst. Pcp wrote in notes: "Prior epidermoid cyst on back s/p excision years ago - resolved. Likely some scar tissue on back from the prior cyst removal. No sign of active cyst or infection."  Patient Goals/Self-Care Activities: Patient will self administer medications as prescribed as evidenced by self report/primary caregiver report  Patient will attend all scheduled provider appointments as evidenced by clinician review of documented attendance to scheduled appointments and patient/caregiver report Patient will call pharmacy for medication refills as evidenced by patient report and review  of pharmacy fill history as appropriate Patient will attend church or other social activities as evidenced by patient report Patient will continue to perform ADL's independently as evidenced by patient/caregiver report Patient will continue to perform IADL's independently as evidenced by patient/caregiver report Patient will call provider office for new concerns or questions as evidenced by review of documented incoming telephone call notes and patient report Patient will work with BSW to address care coordination needs and will continue to work with the clinical team to address health care and disease management related needs as evidenced by documented adherence to scheduled care management/care coordination appointments - call office if I gain more than 2 pounds in one day or 5 pounds in one week - keep legs up while sitting - track weight in diary - use salt in moderation - watch for swelling in feet, ankles and legs every day - weigh myself daily - begin a heart failure diary - bring diary to all appointments - develop a rescue plan - follow rescue plan if symptoms flare-up - eat more whole grains, fruits and vegetables, lean meats and healthy fats - know when to call the doctor:for changes in HF and a rescue plan for periods of exacerbation - track symptoms and what helps feel better or  worse - keep appointment with eye doctor - check blood sugar at prescribed times: before meals and at bedtime, when you have symptoms of low or high blood sugar, and before and after exercise - check feet daily for cuts, sores or redness - enter blood sugar readings and medication or insulin into daily log - take the blood sugar log to all doctor visits - trim toenails straight across - drink 6 to 8 glasses of water each day - eat fish at least once per week - fill half of plate with vegetables - keep a food diary - limit fast food meals to no more than 1 per week - manage portion size - prepare main meal at home 3 to 5 days each week - read food labels for fat, fiber, carbohydrates and portion size - reduce red meat to 2 to 3 times a week - keep feet up while sitting - wash and dry feet carefully every day - wear comfortable, cotton socks - wear comfortable, well-fitting shoes - check blood pressure 3 times per week - choose a place to take my blood pressure (home, clinic or office, retail store) - write blood pressure results in a log or diary - learn about high blood pressure - keep a blood pressure log - take blood pressure log to all doctor appointments - call doctor for signs and symptoms of high blood pressure - develop an action plan for high blood pressure - keep all doctor appointments - take medications for blood pressure exactly as prescribed - report new symptoms to your doctor - eat more whole grains, fruits and vegetables, lean meats and healthy fats - call for medicine refill 2 or 3 days before it runs out - take all medications exactly as prescribed - call doctor with any symptoms you believe are related to your medicine - call doctor when you experience any new symptoms - go to all doctor appointments as scheduled - adhere to prescribed diet: heart healthy/ADA       Plan:Telephone follow up appointment with care management team member scheduled for:   12-10-2021 at 145 pm  Noreene Larsson RN, MSN, [facility] Mobile: [phone]

## 2021-09-24 NOTE — Patient Instructions (Signed)
Visit Information  Thank you for taking time to visit with me today. Please don't hesitate to contact me if I can be of assistance to you before our next scheduled telephone appointment.  Following are the goals we discussed today:  Heart Failure Interventions:  (Status: Goal on track: YES.) Basic overview and discussion of pathophysiology of Heart Failure reviewed; Provided education on low sodium diet. 09-24-2021: The patient is eating better and incorporating more vegetables in her diet. She is a Teaching laboratory technician. The patient continues to choose healthy eating options and is drinking more juices that are good for her for kidney protections and management of chronic conditions.  Reviewed Heart Failure Action Plan in depth and provided written copy. 09-24-2021: Review and education. The patient sees her providers on a regular basis. ; Assessed need for readable accurate scales in home; Provided education about placing scale on hard, flat surface; Advised patient to weigh each morning after emptying bladder; Discussed importance of daily weight and advised patient to weigh and record daily. 08-10-2021: She took a fluid pill today because she has extra fluid on board. Education on monitoring for weight gain and talked about water weight versus actual weight. 09-24-2021: Denies any issues with HF exacerbations at this time. States her conditions are stable.  Reviewed role of diuretics in prevention of fluid overload and management of heart failure. 09-24-2021: The patient is compliant with medications; Discussed the importance of keeping all appointments with provider. 10-24-2021 at 0920 am Provided patient with education about the role of exercise in the management of heart failure;   Diabetes:  (Status: Goal on track: YES.)      Lab Results  Component Value Date    HGBA1C 6.7 (H) 04/20/2021  09-24-2021: Will have upcoming blood work in June at [place] patient's understanding of A1c goal:  <7%. 09-24-2021: The patient knows goals of care and is at goal with A1C. Will have new A1C in June.  Provided education to patient about basic DM disease process; Reviewed medications with patient and discussed importance of medication adherence. 03-05-2021: Incoming call from the patient asking if she could get the contact information for the pharm D. She is not out of her Trulicity but will need assistance with the refill. Number provided. Will also send an inbasket message to pharm D for follow up. 09-24-2021: Is working with the pharm D for ongoing support and management of medications;        Reviewed prescribed diet with patient heart healthy/ADA diet. 09-24-2021: Is monitoring her dietary intake and is compliant with heart healthy/ADA diet. She is eating more vegetables and doing better with her dietary  restrictions. Is happy that she is eating healthier and feeling good; Counseled on importance of regular laboratory monitoring as prescribed. 09-24-2021: The patient has regular labs done.        Discussed plans with patient for ongoing care management follow up and provided patient with direct contact information for care management team;      Provided patient with written educational materials related to hypo and hyperglycemia and importance of correct treatment. 04-05-2021: The patient states last week she had a low of 64. States she was asymptomatic. She states she did not feel worse. Education on making sure when blood sugars are that low to eat well. Review of sx and sx of hypoglycemia and hyperglycemia. 5-22--2023: The patient denies any lows or highs.        Reviewed scheduled/upcoming provider appointments including: 10-24-2021 0920 am  Advised patient, providing education and rationale, to check cbg as directed and record. 02-22-2021: The patient states that her glucose readings have been good. 120-162 with the highest in the last two weeks of 147. 04-05-2021: The patient provided several  readings of her blood sugars. Range has been 64 to 146. Denies any acute findings. Has her log to take with her to the provider office at upcoming visit.  08-10-2021: The patient states her blood sugar this am at 102. She says her blood sugars are doing well. She is happy that they are doing so good.   09-24-2021: The patient states that her blood sugars are staying below 120 for the most part. She has seen 134 and 137 but not that consistently.    call provider for findings outside established parameters;       Referral made to pharmacy team for assistance with ongoing support and education with medication management;       Review of patient status, including review of consultants reports, relevant laboratory and other test results, and medications completed;       Screening for signs and symptoms of depression related to chronic disease state;        Assessed social determinant of health barriers;       Last eye exam on 04-19-2021. No evidence of diabetic neuropathy. Has a cataract that has not matured yet that they are monitoring.     Hyperlipidemia:  (Status: Goal on track: YES.)      Lab Results  Component Value Date    CHOL 155 04/20/2021    HDL 65 04/20/2021    LDLCALC 72 04/20/2021    TRIG 95 04/20/2021    CHOLHDL 2.4 04/20/2021      Medication review performed; medication list updated in electronic medical record. 09-24-2021: The patient is compliant with medications. Works with the [facility] as well.  Provider established cholesterol goals reviewed. 09-24-2021: The patient is at goal, praised for being at goal. Counseled on importance of regular laboratory monitoring as prescribed. 09-24-2021: The patient has regular blood work done.  Provided HLD educational materials; Reviewed role and benefits of statin for ASCVD risk reduction; Discussed strategies to manage statin-induced myalgias; Reviewed importance of limiting foods high in cholesterol. 09-24-2021: Is compliant with heart  healthy/ADA diet. The patient states that she is doing well with her diet and much better eating habits;   Hypertension: (Status: Goal on track: YES.) Last practice recorded BP readings:     BP Readings from Last 3 Encounters:  07/12/21 (!) 130/56  06/06/21 138/62  05/18/21 (!) 148/70  06-07-2021: Self reported blood pressure from today's reading is 136/71 with a pulse of 78 08-10-2021: Self reported 139/62, 150/72, and 126/56 09-24-2021: Self reported this am 130/56 with pulse of 86 Most recent eGFR/CrCl: No results found for: EGFR  No components found for: CRCL   Evaluation of current treatment plan related to hypertension self management and patient's adherence to plan as established by provider. 06-07-2021: The patient is compliant with plan of care for HTN and maintenance of Heart health. She saw the pcp in December and saw the cardiologist yesterday. She had a good report and no changes in her medications regimen. 09-24-2021: The patient denies any issues with medications or management of HTN or heart health. The patient is consistently having stable blood pressures;   Provided education to patient re: stroke prevention, s/s of heart attack and stroke; Reviewed prescribed diet heart healthy/ADA. 09-24-2021: Review of heart healthy/ADA diet. The  patient is doing well with dietary restrictions. Is being more mindful of what she eats and drinks.  Reviewed medications with patient and discussed importance of compliance. 09-24-2021: The patient is compliant with medications. Has follow up with the pcp in June;  Discussed plans with patient for ongoing care management follow up and provided patient with direct contact information for care management team; Advised patient, providing education and rationale, to monitor blood pressure daily and record, calling PCP for findings outside established parameters. 04-05-2021: The patient is checking her blood pressures daily. The patient denies any issues with blood  pressures. Systolic range is 924 to 462 and diastolic 86-38. Review of goal of systolic of <177 and diastolic <11. The patient denies any headaches or other issues related to possible elevation in blood pressures. 06-07-2021: The patients self reported blood pressure today was 136/64 with a pulse of 78. She is doing well. 08-10-2021: Self reported 139/62, 150/72 and 126/56.  09-24-2021: Her blood pressure today was 130/56 with pulse of 86 Reviewed scheduled/upcoming provider appointments including: 10-24-2021 at 0920am Provided education on prescribed diet heart healthy/ADA ;  Discussed complications of poorly controlled blood pressure such as heart disease, stroke, circulatory complications, vision complications, kidney impairment, sexual dysfunction;    Pain, OA, Sjogren's syndrome:  (Status: Goal on track: YES.) Pain assessment performed. 04-05-2021: The patient states her pain level today is 5 and she is tolerating her pain much better. 06-07-2021: The patient denies pain today. States that she is not having to wear the braces as much and the exercises have helped a lot. 08-10-2021: Rates her knee pain at a 7 today and her neck pain at a 5 today. Says the weather is likely causing some of the issues. Education and support given. 09-24-2021: Denies any pain today. States she is continuing to do her exercises she learned from PT Medications reviewed. 02-22-2021: Patient has been started on Plaquinel 200 mg BID. The patient takes Tylenol for OA pain and discomfort. 09-24-2021 The patient is compliant with medications, has ongoing support from the pharm D.  Reviewed provider established plan for pain management. 02-22-2021: The patient saw the specialist on Monday. She is going to take the Planquinel and see how that works. She has been working with PT and has strengthening exercises to do. She states that they may do more testing but are going to see how this works for now. She has been diagnosed with Sjogren's  syndrome.  Also she has carpal tunnel syndrome and OA. 04-05-2021: The patient goes tomorrow to the specialist at 2 pm to follow up with the pain and numbness in her hands. She states she is doing much better with the exercises she has been doing and the recommendations given to her by the provider. 06-07-2021: The patient continues to do her exercises and other things and it is really helping her pain in her hands. She said the doctor told her they could do injections but she does not wish to do that.  Discussed importance of adherence to all scheduled medical appointments. Next pcp appointment on 10-24-2021 at 0920 am.  Counseled on the importance of reporting any/all new or changed pain symptoms or management strategies to pain management provider; Advised patient to report to care team affect of pain on daily activities; Discussed use of relaxation techniques and/or diversional activities to assist with pain reduction (distraction, imagery, relaxation, massage, acupressure, TENS, heat, and cold application; Reviewed with patient prescribed pharmacological and nonpharmacological pain relief strategies; Advised patient to discuss  unresolved pain or changes in level or intensity of pain with provider; Screening for signs and symptoms of depression related to chronic disease state;  Assessed social determinant of health barriers;   Our next appointment is by telephone on 12-10-2021 at 145 pm  Please call the care guide team at [phone] if you need to cancel or reschedule your appointment.   If you are experiencing a Mental Health or [place] or need someone to talk to, please call the Suicide and Crisis Lifeline: 988 call the [place] National Suicide Prevention Lifeline: (878)829-5890 or TTY: 703-797-7424 TTY (216)441-6971) to talk to a trained counselor call 1-800-273-TALK (toll free, 24 hour hotline)   Patient verbalizes understanding of instructions and care plan provided today and  agrees to view in [place]. Active MyChart status and patient understanding of how to access instructions and care plan via MyChart confirmed with patient.     Noreene Larsson RN, MSN, [facility] Mobile: [phone]

## 2021-10-03 DIAGNOSIS — Z794 Long term (current) use of insulin: Secondary | ICD-10-CM

## 2021-10-03 DIAGNOSIS — I509 Heart failure, unspecified: Secondary | ICD-10-CM | POA: Diagnosis not present

## 2021-10-03 DIAGNOSIS — E1159 Type 2 diabetes mellitus with other circulatory complications: Secondary | ICD-10-CM

## 2021-10-03 DIAGNOSIS — E785 Hyperlipidemia, unspecified: Secondary | ICD-10-CM | POA: Diagnosis not present

## 2021-10-03 DIAGNOSIS — Z87891 Personal history of nicotine dependence: Secondary | ICD-10-CM

## 2021-10-03 DIAGNOSIS — I10 Essential (primary) hypertension: Secondary | ICD-10-CM | POA: Diagnosis not present

## 2021-10-05 DIAGNOSIS — N1832 Chronic kidney disease, stage 3b: Secondary | ICD-10-CM | POA: Diagnosis not present

## 2021-10-05 DIAGNOSIS — N189 Chronic kidney disease, unspecified: Secondary | ICD-10-CM | POA: Diagnosis not present

## 2021-10-05 DIAGNOSIS — D649 Anemia, unspecified: Secondary | ICD-10-CM | POA: Diagnosis not present

## 2021-10-05 DIAGNOSIS — G249 Dystonia, unspecified: Secondary | ICD-10-CM | POA: Diagnosis not present

## 2021-10-05 DIAGNOSIS — G5603 Carpal tunnel syndrome, bilateral upper limbs: Secondary | ICD-10-CM | POA: Diagnosis not present

## 2021-10-05 DIAGNOSIS — M542 Cervicalgia: Secondary | ICD-10-CM | POA: Diagnosis not present

## 2021-10-05 DIAGNOSIS — R030 Elevated blood-pressure reading, without diagnosis of hypertension: Secondary | ICD-10-CM | POA: Diagnosis not present

## 2021-10-05 DIAGNOSIS — R768 Other specified abnormal immunological findings in serum: Secondary | ICD-10-CM | POA: Diagnosis not present

## 2021-10-22 DIAGNOSIS — E113393 Type 2 diabetes mellitus with moderate nonproliferative diabetic retinopathy without macular edema, bilateral: Secondary | ICD-10-CM | POA: Diagnosis not present

## 2021-10-22 LAB — HM DIABETES EYE EXAM

## 2021-10-24 ENCOUNTER — Ambulatory Visit: Payer: Medicare Other | Admitting: Family Medicine

## 2021-10-29 ENCOUNTER — Encounter: Payer: Self-pay | Admitting: Family Medicine

## 2021-10-29 ENCOUNTER — Ambulatory Visit (INDEPENDENT_AMBULATORY_CARE_PROVIDER_SITE_OTHER): Payer: Medicare Other | Admitting: Family Medicine

## 2021-10-29 ENCOUNTER — Other Ambulatory Visit: Payer: Self-pay | Admitting: Family Medicine

## 2021-10-29 VITALS — BP 135/44 | HR 80 | Ht 60.0 in | Wt 237.6 lb

## 2021-10-29 DIAGNOSIS — E1169 Type 2 diabetes mellitus with other specified complication: Secondary | ICD-10-CM | POA: Diagnosis not present

## 2021-10-29 DIAGNOSIS — D649 Anemia, unspecified: Secondary | ICD-10-CM

## 2021-10-29 DIAGNOSIS — L8991 Pressure ulcer of unspecified site, stage 1: Secondary | ICD-10-CM

## 2021-10-29 DIAGNOSIS — M3505 Sjogren syndrome with inflammatory arthritis: Secondary | ICD-10-CM

## 2021-10-29 DIAGNOSIS — Z0000 Encounter for general adult medical examination without abnormal findings: Secondary | ICD-10-CM

## 2021-10-29 LAB — POCT GLYCOSYLATED HEMOGLOBIN (HGB A1C): Hemoglobin A1C: 6.1 % — AB (ref 4.0–5.6)

## 2021-10-29 MED ORDER — MUPIROCIN 2 % EX OINT: 1.0000 | TOPICAL_OINTMENT | Freq: Two times a day (BID) | CUTANEOUS | 3 refills | Status: AC

## 2021-10-29 NOTE — Progress Notes (Signed)
Subjective:    Patient ID: Diana Harmon, female    DOB: February 18, 1946, 76 y.o.   MRN: [id number]  Diana Harmon is a 76 y.o. female presenting on 10/29/2021 for Diabetes and Hypertension   HPI  CHRONIC DM, Type 2 with Hyperlipidemia, Morbid Obesity HTN CKD-III Improved A1c to 6.1 now today. Previous 6.7 Admits diet changes since last visit had been on Trulicity 0.75mg  weekly inj and major improved diet Side effect on metformin has stopped. CBGs: Improved. Avg 140s - stable overall Meds - Trulicity 0.75mg  weekly inj - on financial assistance Reports good compliance, tolerating med well Currently on ARB, ASA 81, Simvastatin 20mg  Lifestyle: Improving DM diet. Low carb. Improved diet with more fish, BBQ, more greens, kale. - Followed by Dr Hulen Luster optometry - last exam done 10/22/21. Negative. Denies hypoglycemia   PMH Sjogren's Syndrome Controlled on Hydroxychloroquine       05/18/2021   10:31 AM 11/23/2020    9:46 AM 04/25/2020   10:53 AM  Depression screen PHQ 2/9  Decreased Interest 0 1 0  Down, Depressed, Hopeless 0 0 0  PHQ - 2 Score 0 1 0  Altered sleeping  0   Tired, decreased energy  2   Change in appetite  0   Feeling bad or failure about yourself   0   Trouble concentrating  0   Moving slowly or fidgety/restless  0   Suicidal thoughts  0   PHQ-9 Score  3   Difficult doing work/chores  Not difficult at all     Social History   Tobacco Use   Smoking status: Former    Years: 20.00    Types: Cigarettes    Quit date: 09/05/1979    Years since quitting: 42.1   Smokeless tobacco: Former  Building services engineer Use: Never used  Substance Use Topics   Alcohol use: Yes    Alcohol/week: 0.0 - 1.0 standard drinks of alcohol    Comment: occ. wine   Drug use: No    Review of Systems Per HPI unless specifically indicated above     Objective:    BP (!) 135/44   Pulse 80   Ht 5' (1.524 m)   Wt 237 lb 9.6 oz (107.8 kg)   SpO2 100%    BMI 46.40 kg/m   Wt Readings from Last 3 Encounters:  10/29/21 237 lb 9.6 oz (107.8 kg)  07/12/21 236 lb 14.4 oz (107.5 kg)  06/06/21 235 lb 6 oz (106.8 kg)    Physical Exam Vitals and nursing note reviewed.  Constitutional:      General: She is not in acute distress.    Appearance: She is well-developed. She is obese. She is not diaphoretic.     Comments: Well-appearing, comfortable, cooperative  HENT:     Head: Normocephalic and atraumatic.  Eyes:     General:        Right eye: No discharge.        Left eye: No discharge.     Conjunctiva/sclera: Conjunctivae normal.  Neck:     Thyroid: No thyromegaly.  Cardiovascular:     Rate and Rhythm: Normal rate and regular rhythm.     Heart sounds: Normal heart sounds. No murmur heard. Pulmonary:     Effort: Pulmonary effort is normal. No respiratory distress.     Breath sounds: Normal breath sounds. No wheezing or rales.  Musculoskeletal:        General: Normal range of motion.  Cervical back: Normal range of motion and neck supple.  Lymphadenopathy:     Cervical: No cervical adenopathy.  Skin:    General: Skin is warm and dry.     Findings: No erythema or rash.  Neurological:     Mental Status: She is alert and oriented to person, place, and time.  Psychiatric:        Behavior: Behavior normal.     Comments: Well groomed, good eye contact, normal speech and thoughts      Results for orders placed or performed in visit on 10/29/21  POCT glycosylated hemoglobin (Hb A1C)  Result Value Ref Range   Hemoglobin A1C 6.1 (A) 4.0 - 5.6 %  HM DIABETES EYE EXAM  Result Value Ref Range   HM Diabetic Eye Exam No Retinopathy No Retinopathy      Assessment & Plan:   Problem List Items Addressed This Visit     Type 2 diabetes mellitus with other specified complication (HCC) - Primary    A1c improved to 6.1 No hyperglycemia or hypoglycemia Complications - other including hyperlipidemia, GERD, obesity - increases risk of future  cardiovascular complications   Plan:  Continue Trulicity 0.75mg  weekly inj doing well on GLP1 - [facility] PAP Off Metformin 2. Encourage improved lifestyle - low carb, low sugar diet, reduce portion size, continue improving regular exercise 3. Check CBG, bring log to next visit for review 4. Continue ASA, ARB, Statin       Relevant Orders   POCT glycosylated hemoglobin (Hb A1C) (Completed)   Sjogren's syndrome with inflammatory arthritis (HCC)   Other Visit Diagnoses     Healing pressure injury, stage 1       Relevant Medications   mupirocin ointment (BACTROBAN) 2 %       Sjogren's Syndrome Followed by Rheumatology On Hydroxychloroquine  Topical ointment for pressure sore    Meds ordered this encounter  Medications   mupirocin ointment (BACTROBAN) 2 %    Sig: Apply 1 Application topically 2 (two) times daily.    Dispense:  22 g    Refill:  3      Follow up plan: Return in about 6 months (around 04/30/2022) for 6 month fasting lab only then 1 week later Annual Physical.  Future labs ordered for 04/24/22  Saralyn Pilar, DO [facility] 10/29/2021, 10:55 AM

## 2021-11-12 ENCOUNTER — Ambulatory Visit (INDEPENDENT_AMBULATORY_CARE_PROVIDER_SITE_OTHER): Payer: Medicare Other | Admitting: Pharmacist

## 2021-11-12 DIAGNOSIS — E1169 Type 2 diabetes mellitus with other specified complication: Secondary | ICD-10-CM

## 2021-11-12 DIAGNOSIS — I10 Essential (primary) hypertension: Secondary | ICD-10-CM

## 2021-11-12 NOTE — Patient Instructions (Signed)
Visit Information  Thank you for taking time to visit with me today. Please don't hesitate to contact me if I can be of assistance to you before our next scheduled telephone appointment.  Following are the goals we discussed today:   Goals Addressed             This Visit's Progress    Pharmacy Goals       Our goal A1c is less than 7%. This corresponds with fasting sugars less than 130 and 2 hour after meal sugars less than 180. Please keep a log of your results when checking your blood sugar  Our goal bad cholesterol, or LDL, is less than 70 . This is why it is important to continue taking your simvastatin  Please check your home blood pressure, keep a log of the results and bring this with you to your medical appointments.  Feel free to call me with any questions or concerns. I look forward to our next call!   Wallace Cullens, PharmD, [place], CPP Clinical Pharmacist [facility] [phone]          Our next appointment is by telephone on 10/11 at 10:30 am  Please call the care guide team at [phone] if you need to cancel or reschedule your appointment.    Patient verbalizes understanding of instructions and care plan provided today and agrees to view in [place]. Active MyChart status and patient understanding of how to access instructions and care plan via MyChart confirmed with patient.

## 2021-11-12 NOTE — Chronic Care Management (AMB) (Signed)
Chronic Care Management CCM Pharmacy Note  11/12/2021 Name:  Diana Harmon MRN:  [id number] DOB:  [birth year]   Subjective: Diana Harmon is an 76 y.o. year old female who is a primary patient of [name], DO.  The CCM team was consulted for assistance with disease management and care coordination needs.    Engaged with patient by telephone for follow up visit for pharmacy case management and/or care coordination services.   Objective:  Medications Reviewed Today     Reviewed by Rennis Petty, RPH-CPP (Pharmacist) on 11/12/21 at 1054  Med List Status: <None>   Medication Order Taking? Sig Documenting Provider Last Dose Status Informant  ACCU-CHEK GUIDE test strip [id number]  Use to check blood sugar twice daily [name], DO  Active   Accu-Chek Softclix Lancets lancets [id number]  Use to check blood sugar twice daily [name], DO  Active   acetaminophen (TYLENOL) 500 MG tablet [id number]  Take 1,000 mg by mouth every 6 (six) hours as needed for moderate pain.  [provider]  Active Child  Artificial Tear Solution (GENTEAL TEARS) 0.1-0.2-0.3 % SOLN [id number]  Place 1 drop into both eyes 3 (three) times daily as needed (dry/irritated eyes.). [provider]  Active Child  aspirin 81 MG chewable tablet [id number]  Chew 1 tablet (81 mg total) by mouth daily. [name]  Active Child  B COMPLEX-C PO [id number]  Take 10 mLs by mouth daily. [provider]  Active Child  baclofen (LIORESAL) 10 MG tablet [id number] Yes TAKE 1 TO 2 TABLETS BY MOUTH TWICE DAILY AS NEEDED FOR MUSCLE SPASM [place] Ranger, Devonne Doughty, DO Taking Active   Blood Glucose Monitoring Suppl (ACCU-CHEK GUIDE) w/Device KIT [id number]  Use to check blood sugar twice daily [name], Devonne Doughty, DO  Active   Blood Glucose Monitoring Suppl (GLUCOCOM BLOOD GLUCOSE MONITOR) DEVI [id number]  Use to check blood sugar twice  daily [provider]  Active   Cholecalciferol (VITAMIN D3) 125 MCG (5000 UT) TABS [id number]  Take 5,000 Units by mouth daily. [provider]  Active Child    Discontinued 09/05/19 1316   cyanocobalamin 1000 MCG tablet [id number]  Take by mouth. [provider]  Active   Ferrous Sulfate (IRON) 325 (65 Fe) MG TABS [id number]  TAKE 1 TABLET BY MOUTH TWICE DAILY WITH A MEAL [name], Devonne Doughty, DO  Active Child  furosemide (LASIX) 20 MG tablet [id number]  Take 1 tablet (20 mg total) by mouth daily as needed for edema. Nelva Bush, MD  Expired 09/28/21 2359 Child  hydrochlorothiazide (HYDRODIURIL) 25 MG tablet [id number]  Take 1 tablet (25 mg total) by mouth daily. Furth, Cadence H, PA-C  Active   hydroxychloroquine (PLAQUENIL) 200 MG tablet [id number]  Take 1 tablet by mouth 2 (two) times daily. [provider]  Active   lactobacillus acidophilus (BACID) TABS tablet [id number]  Take 1 tablet by mouth daily as needed (digestive health.). [provider]  Active            Med Note Nat Christen   Wed Nov 03, 2019  3:06 PM)    Lancets Misc. KIT [id number]  Use to check blood sugar twice daily [provider]  Active   levocetirizine (XYZAL) 5 MG tablet [id number]  TAKE 1 TABLET BY MOUTH ONCE DAILY IN THE EVENING  Patient taking differently: Take 1/2 tablet (2.5 mg) daily in the evening as needed for allergies   [name],  DO  Active   losartan (COZAAR) 50 MG tablet [id number]  Take 1 tablet (50 mg total) by mouth daily. Furth, Cadence H, PA-C  Active   magnesium oxide (MAG-OX) 400 MG tablet [id number]  Take 400 mg by mouth daily. [provider]  Active   mupirocin ointment (BACTROBAN) 2 % [id number] Yes Apply 1 Application topically 2 (two) times daily. [name], DO Taking Active   simvastatin (ZOCOR) 20 MG tablet [id number]  TAKE 1 TABLET BY MOUTH ONCE DAILY AT  6  PM [name], Devonne Doughty, DO  Active    TRULICITY 2.22 LN/9.8XQ SOPN [id number] Yes Inject 0.75 mg into the skin once a week. [name], DO Taking Active             Pertinent Labs:  Lab Results  Component Value Date   HGBA1C 6.1 (A) 10/29/2021   Lab Results  Component Value Date   CHOL 155 04/20/2021   HDL 65 04/20/2021   LDLCALC 72 04/20/2021   TRIG 95 04/20/2021   CHOLHDL 2.4 04/20/2021   Lab Results  Component Value Date   CREATININE 1.43 (H) 07/12/2021   BUN 34 (H) 07/12/2021   NA 139 07/12/2021   K 4.1 07/12/2021   CL 102 07/12/2021   CO2 28 07/12/2021   BP Readings from Last 3 Encounters:  10/29/21 (!) 135/44  07/12/21 (!) 130/56  06/06/21 138/62   Pulse Readings from Last 3 Encounters:  10/29/21 80  07/12/21 86  06/06/21 76     SDOH:  (Social Determinants of Health) assessments and interventions performed:    [place]  Review of patient past medical history, allergies, medications, health status, including review of consultants reports, laboratory and other test data, was performed as part of comprehensive evaluation and provision of chronic care management services.   Care Plan : PharmD - Medication Assistance/Management  Updates made by Rennis Petty, RPH-CPP since 11/12/2021 12:00 AM     Problem: Disease Progression      Long-Range Goal: Disease Progression Prevented or Minimized   Start Date: 10/30/2020  Expected End Date: 01/28/2021  Recent Progress: On track  Priority: High  Note:   Current Barriers:  Unable to independently afford treatment regimen Patient APPROVED for [facility] Patient Assistance Program for Trulicity, eligible from 11/21/20-05/05/22  Pharmacist Clinical Goal(s):  Over the next 90 days, patient will verbalize ability to afford treatment regimen through collaboration with PharmD and provider.   Interventions: 1:1 collaboration with [name], DO regarding development and update of comprehensive plan of care as evidenced  by provider attestation and co-signature Inter-disciplinary care team collaboration (see longitudinal plan of care) Perform chart review. Office Visit with [facility] on 6/2 Office Visit with PCP on 6/26 A1C 6.1% Provider placed order for mupirocin ointment Rx Today reports pressure spot has healed with use of mupirocin ointment Reports continues to have discomfort and spasms in neck and shoulders. Reports discussed with Neurologist last month, but not sure if she wants to try recommended injections.  Reports heating pad, massage therapy and baclofen (only takes at bedtime) help  T2DM: Controlled; current treatment: Trulicity 1.44 mg weekly Previous therapies: metformin Reports recent morning fasting blood sugar readings ranging: 89-115 Denies symptoms of hypoglycemia Reports doing well with having well balanced meals, while limiting carbohydrate content Cholesterol management: simvastatin 20 mg daily Exercise Reports exercise limited by arthritis Reports uses upright exercise bike, exercise from PT and/or yoga at home  Encourage patient to continue to  monitor home blood sugar, keep log and bring record with her to medical appointments   HTN: Current treatment: HCTZ 25 mg daily Losartan 50 mg daily Furosemide 20 mg daily as needed for edema Note patient has home upper arm blood pressure monitor Unable to access her blood pressure log today, but reports recent readings have been "good" Denies symptoms of hypotension Have counseled on impact of salt/sodium in diet on blood pressure. Encourage patient to limit sodium intake Have encouraged patient to bring record of daily weights and home BP readings with her to medical appointments, but to call provider sooner if readings outside of established parameters    Patient Goals/Self-Care Activities Over the next 90 days, patient will:  - take medications as prescribed - check glucose, document, and provide at future  appointments - check blood pressure, document, and provide at future appointments - collaborate with provider on medication access solutions - attend medical appointments as scheduled  Follow Up Plan: Telephone follow up appointment with care management team member scheduled for: 10/11 at 10:30 am      Wallace Cullens, PharmD, Para [place], [place] [phone]

## 2021-11-15 ENCOUNTER — Emergency Department: Payer: Medicare Other

## 2021-11-15 ENCOUNTER — Emergency Department
Admission: EM | Admit: 2021-11-15 | Discharge: 2021-11-15 | Disposition: A | Discharge status: Home / Self Care | Payer: Medicare Other | Attending: Emergency Medicine | Admitting: Emergency Medicine

## 2021-11-15 ENCOUNTER — Encounter: Payer: Self-pay | Admitting: Emergency Medicine

## 2021-11-15 ENCOUNTER — Other Ambulatory Visit: Payer: Self-pay

## 2021-11-15 DIAGNOSIS — R202 Paresthesia of skin: Secondary | ICD-10-CM | POA: Diagnosis not present

## 2021-11-15 DIAGNOSIS — S161XXA Strain of muscle, fascia and tendon at neck level, initial encounter: Secondary | ICD-10-CM | POA: Insufficient documentation

## 2021-11-15 DIAGNOSIS — H9202 Otalgia, left ear: Secondary | ICD-10-CM | POA: Insufficient documentation

## 2021-11-15 DIAGNOSIS — R519 Headache, unspecified: Secondary | ICD-10-CM | POA: Diagnosis not present

## 2021-11-15 DIAGNOSIS — R9431 Abnormal electrocardiogram [ECG] [EKG]: Secondary | ICD-10-CM | POA: Diagnosis not present

## 2021-11-15 DIAGNOSIS — M4802 Spinal stenosis, cervical region: Secondary | ICD-10-CM | POA: Diagnosis not present

## 2021-11-15 DIAGNOSIS — R29818 Other symptoms and signs involving the nervous system: Secondary | ICD-10-CM | POA: Diagnosis not present

## 2021-11-15 DIAGNOSIS — R200 Anesthesia of skin: Secondary | ICD-10-CM | POA: Diagnosis not present

## 2021-11-15 DIAGNOSIS — I10 Essential (primary) hypertension: Secondary | ICD-10-CM | POA: Diagnosis not present

## 2021-11-15 DIAGNOSIS — Z7982 Long term (current) use of aspirin: Secondary | ICD-10-CM | POA: Insufficient documentation

## 2021-11-15 DIAGNOSIS — E119 Type 2 diabetes mellitus without complications: Secondary | ICD-10-CM | POA: Insufficient documentation

## 2021-11-15 DIAGNOSIS — R531 Weakness: Secondary | ICD-10-CM | POA: Diagnosis not present

## 2021-11-15 DIAGNOSIS — X58XXXA Exposure to other specified factors, initial encounter: Secondary | ICD-10-CM | POA: Insufficient documentation

## 2021-11-15 DIAGNOSIS — S199XXA Unspecified injury of neck, initial encounter: Secondary | ICD-10-CM | POA: Diagnosis present

## 2021-11-15 LAB — COMPREHENSIVE METABOLIC PANEL
ALT: 20 U/L (ref 0–44)
AST: 32 U/L (ref 15–41)
Albumin: 3.4 g/dL — ABNORMAL LOW (ref 3.5–5.0)
Alkaline Phosphatase: 62 U/L (ref 38–126)
Anion gap: 10 (ref 5–15)
BUN: 30 mg/dL — ABNORMAL HIGH (ref 8–23)
CO2: 23 mmol/L (ref 22–32)
Calcium: 9.2 mg/dL (ref 8.9–10.3)
Chloride: 106 mmol/L (ref 98–111)
Creatinine, Ser: 1.22 mg/dL — ABNORMAL HIGH (ref 0.44–1.00)
GFR, Estimated: 46 mL/min — ABNORMAL LOW (ref >60)
Glucose, Bld: 157 mg/dL — ABNORMAL HIGH (ref 70–99)
Potassium: 4 mmol/L (ref 3.5–5.1)
Sodium: 139 mmol/L (ref 135–145)
Total Bilirubin: 0.5 mg/dL (ref 0.3–1.2)
Total Protein: 7.9 g/dL (ref 6.5–8.1)

## 2021-11-15 LAB — DIFFERENTIAL
Abs Immature Granulocytes: 0.01 10*3/uL (ref 0.00–0.07)
Basophils Absolute: 0 10*3/uL (ref 0.0–0.1)
Basophils Relative: 1 %
Eosinophils Absolute: 0.1 10*3/uL (ref 0.0–0.5)
Eosinophils Relative: 2 %
Immature Granulocytes: 0 %
Lymphocytes Relative: 24 %
Lymphs Abs: 1 10*3/uL (ref 0.7–4.0)
Monocytes Absolute: 0.5 10*3/uL (ref 0.1–1.0)
Monocytes Relative: 11 %
Neutro Abs: 2.8 10*3/uL (ref 1.7–7.7)
Neutrophils Relative %: 62 %

## 2021-11-15 LAB — CBC
HCT: 34.3 % — ABNORMAL LOW (ref 36.0–46.0)
Hemoglobin: 10.6 g/dL — ABNORMAL LOW (ref 12.0–15.0)
MCH: 28.8 pg (ref 26.0–34.0)
MCHC: 30.9 g/dL (ref 30.0–36.0)
MCV: 93.2 fL (ref 80.0–100.0)
Platelets: 191 10*3/uL (ref 150–400)
RBC: 3.68 MIL/uL — ABNORMAL LOW (ref 3.87–5.11)
RDW: 13.3 % (ref 11.5–15.5)
WBC: 4.4 10*3/uL (ref 4.0–10.5)
nRBC: 0 % (ref 0.0–0.2)

## 2021-11-15 LAB — PROTIME-INR
INR: 1 (ref 0.8–1.2)
Prothrombin Time: 13.4 seconds (ref 11.4–15.2)

## 2021-11-15 LAB — ETHANOL: Alcohol, Ethyl (B): <10 mg/dL (ref <10)

## 2021-11-15 LAB — APTT: aPTT: 27 seconds (ref 24–36)

## 2021-11-15 LAB — CBG MONITORING, ED: Glucose-Capillary: 174 mg/dL — ABNORMAL HIGH (ref 70–99)

## 2021-11-15 MED ORDER — HYDROCODONE-ACETAMINOPHEN 5-325 MG PO TABS: 1.0000 | ORAL_TABLET | Freq: Three times a day (TID) | ORAL | 0 refills | Status: AC | PRN

## 2021-11-15 MED ORDER — CYCLOBENZAPRINE HCL 10 MG PO TABS
10.0000 mg | ORAL_TABLET | Freq: Once | ORAL | Status: AC
  Administered 2021-11-15: 10 mg via ORAL
  Filled 2021-11-15: qty 1

## 2021-11-15 MED ORDER — LIDOCAINE 5 % EX PTCH: 1.0000 | MEDICATED_PATCH | Freq: Two times a day (BID) | CUTANEOUS | 0 refills | Status: AC

## 2021-11-15 MED ORDER — HYDROCODONE-ACETAMINOPHEN 5-325 MG PO TABS
1.0000 | ORAL_TABLET | Freq: Once | ORAL | Status: AC
  Administered 2021-11-15: 1 via ORAL
  Filled 2021-11-15: qty 1

## 2021-11-15 MED ORDER — ONDANSETRON 4 MG PO TBDP
4.0000 mg | ORAL_TABLET | Freq: Once | ORAL | Status: AC
  Administered 2021-11-15: 4 mg via ORAL
  Filled 2021-11-15: qty 1

## 2021-11-15 MED ORDER — PREDNISONE 20 MG PO TABS: 40.0000 mg | ORAL_TABLET | Freq: Every day | ORAL | 0 refills | Status: AC

## 2021-11-15 MED ORDER — METHOCARBAMOL 500 MG PO TABS
500.0000 mg | ORAL_TABLET | Freq: Three times a day (TID) | ORAL | 0 refills | Status: AC
Start: 2021-11-15 — End: 2021-11-22

## 2021-11-15 MED ORDER — LIDOCAINE 5 % EX PTCH
1.0000 | MEDICATED_PATCH | Freq: Once | CUTANEOUS | Status: DC
  Administered 2021-11-15: 1 via TRANSDERMAL
  Filled 2021-11-15: qty 1

## 2021-11-15 MED ORDER — PREDNISONE 20 MG PO TABS
60.0000 mg | ORAL_TABLET | Freq: Once | ORAL | Status: AC
  Administered 2021-11-15: 60 mg via ORAL
  Filled 2021-11-15: qty 3

## 2021-11-15 NOTE — ED Notes (Signed)
Pt. Returned from MRI 

## 2021-11-15 NOTE — ED Triage Notes (Signed)
Pt via POV from home. Pt c/o R arm numbness and pain that started about a week ago, states pain then travelled to the L arm numbness either Tuesday or Wednesday. Denies numbness or pain in the lower extremities but states that she has felt like the L side of her face has been swollen and painful for the past 2 days. Facial droop on the L side noticed. Pt takes baby ASA. Denies hx of a stroke. States she is now having pain behind her L ear. Denies dizziness. Denies vision changes. Pt is A&Ox4 and NAD

## 2021-11-15 NOTE — ED Notes (Signed)
Pt. In MRI.

## 2021-11-15 NOTE — ED Notes (Signed)
ED Provider at bedside. 

## 2021-11-15 NOTE — ED Provider Notes (Signed)
[facility] Emergency Department Provider Note     Event Date/Time   First MD Initiated Contact with Patient 11/15/21 1118     (approximate)   History   Numbness   HPI  Diana Harmon is a 76 y.o. female with a history of type 2 diabetes, hypertension, Mnire's disease, TIA, aortic valve replacement, presents to the ED via personal vehicle from home.  Patient presents for right arm numbness and weakness that started out about a week ago.  She reports then the onset of left arm numbness a day or 2 ago.  She denies any lower extremity numbness but has felt some fullness since pain to the left side of her face , but baseline per family.  Patient does take a baby aspirin but denies any blood thinner use.  She denies history of stroke, but chart review reveals a history of TIA.  Patient reports now some pain behind her left ear.  She denies any associated dizziness, vision loss, or slurred speech.  She also denies any recent injury, trauma, or falls.     Physical Exam   Triage Vital Signs: ED Triage Vitals  Enc Vitals Group     BP 11/15/21 1021 (!) 145/66     Pulse Rate 11/15/21 1021 82     Resp 11/15/21 1021 16     Temp 11/15/21 1021 98.9 F (37.2 C)     Temp Source 11/15/21 1021 Oral     SpO2 11/15/21 1021 99 %     Weight 11/15/21 1017 230 lb (104.3 kg)     Height 11/15/21 1017 5' (1.524 m)     Head Circumference --      Peak Flow --      Pain Score 11/15/21 1017 8     Pain Loc --      Pain Edu? --      Excl. in [place]? --     Most recent vital signs: Vitals:   11/15/21 1526 11/15/21 1635  BP: 139/68 (!) 142/72  Pulse: 72 75  Resp: 14 16  Temp:    SpO2: 98% 98%    General Awake, no distress. NAD HEENT NCAT. PERRL. EOMI. No rhinorrhea. Mucous membranes are moist.  CV:  Good peripheral perfusion.  RESP:  Normal effort.  ABD:  No distention.  MSK:  Normal composite fist, right > left. Neck supple except for palpation along the left  SCM. Left neck rotation and lateral bending elicit pain. AROM LUE with pain to shoulder with external rotation. No rotator cuff deficits noted.  NEURO: CN II-XII grossly intact. Normal UE DTRs bilaterally.    ED Results / Procedures / Treatments   Labs (all labs ordered are listed, but only abnormal results are displayed) Labs Reviewed  CBC - Abnormal; Notable for the following components:      Result Value   RBC 3.68 (*)    Hemoglobin 10.6 (*)    HCT 34.3 (*)    All other components within normal limits  COMPREHENSIVE METABOLIC PANEL - Abnormal; Notable for the following components:   Glucose, Bld 157 (*)    BUN 30 (*)    Creatinine, Ser 1.22 (*)    Albumin 3.4 (*)    GFR, Estimated 46 (*)    All other components within normal limits  CBG MONITORING, ED - Abnormal; Notable for the following components:   Glucose-Capillary 174 (*)    All other components within normal limits  PROTIME-INR  APTT  DIFFERENTIAL  ETHANOL     EKG  Vent. rate 80 BPM PR interval 204 ms QRS duration 140 ms QT/QTcB 420/484 ms P-R-T axes 61 -10 51  RADIOLOGY  I personally viewed and evaluated these images as part of my medical decision making, as well as reviewing the written report by the radiologist.  ED Provider Interpretation: no acute findings}  MR Cervical Spine Wo Contrast  Result Date: 11/15/2021 CLINICAL DATA:  Neuro deficit, acute, stroke suspected; LUE weakness/paresthesias EXAM: MRI HEAD WITHOUT CONTRAST MRI CERVICAL SPINE WITHOUT CONTRAST TECHNIQUE: Multiplanar, multiecho pulse sequences of the brain and surrounding structures, and cervical spine, to include the craniocervical junction and cervicothoracic junction, were obtained without intravenous contrast. COMPARISON:  None Available. FINDINGS: MRI HEAD FINDINGS Brain: No acute infarction, hemorrhage, hydrocephalus, extra-axial collection or mass lesion. Mild to moderate for age scattered T2/FLAIR hyperintensities in the white  matter, nonspecific but compatible with chronic microvascular ischemic disease. Vascular: Major arterial flow voids are maintained at the skull base. Skull and upper cervical spine: Normal marrow signal. Sinuses/Orbits: Clear sinuses.  No acute orbital finding. Other: No mastoid effusions MRI CERVICAL SPINE FINDINGS Alignment: Normal. Vertebrae: Vertebral body heights are maintained. No focal marrow edema chest acute fracture or discitis/osteomyelitis. No suspicious bone lesions. Cord: Normal cord signal. Posterior Fossa, vertebral arteries, paraspinal tissues: Visualized vertebral artery flow voids are maintained. Disc levels: C2-C3: No significant disc protrusion, foraminal stenosis, or canal stenosis. C3-C4: Small posterior disc osteophyte complex with mild bilateral facet arthropathy. No significant canal or foraminal stenosis. C4-C5: Small posterior disc osteophyte complex with right greater than left facet arthropathy. Resulting mild right foraminal stenosis. Patent canal and left foramen. C5-C6: Posterior disc osteophyte complex with right greater than left facet and uncovertebral hypertrophy. Resulting mild right foraminal stenosis. Mild canal stenosis. Patent left foramen. C6-C7: Small posterior disc osteophyte complex and bilateral facet arthropathy. Mild canal stenosis. No significant foraminal stenosis C7-T1: No significant disc protrusion, foraminal stenosis, or canal stenosis. IMPRESSION: 1. No evidence of acute intracranial abnormality. 2. Multilevel degenerative change (detailed above) without high-grade canal or foraminal stenosis. Electronically Signed   By: Margaretha Sheffield M.D.   On: 11/15/2021 15:01   MR BRAIN WO CONTRAST  Result Date: 11/15/2021 CLINICAL DATA:  Neuro deficit, acute, stroke suspected; LUE weakness/paresthesias EXAM: MRI HEAD WITHOUT CONTRAST MRI CERVICAL SPINE WITHOUT CONTRAST TECHNIQUE: Multiplanar, multiecho pulse sequences of the brain and surrounding structures, and  cervical spine, to include the craniocervical junction and cervicothoracic junction, were obtained without intravenous contrast. COMPARISON:  None Available. FINDINGS: MRI HEAD FINDINGS Brain: No acute infarction, hemorrhage, hydrocephalus, extra-axial collection or mass lesion. Mild to moderate for age scattered T2/FLAIR hyperintensities in the white matter, nonspecific but compatible with chronic microvascular ischemic disease. Vascular: Major arterial flow voids are maintained at the skull base. Skull and upper cervical spine: Normal marrow signal. Sinuses/Orbits: Clear sinuses.  No acute orbital finding. Other: No mastoid effusions MRI CERVICAL SPINE FINDINGS Alignment: Normal. Vertebrae: Vertebral body heights are maintained. No focal marrow edema chest acute fracture or discitis/osteomyelitis. No suspicious bone lesions. Cord: Normal cord signal. Posterior Fossa, vertebral arteries, paraspinal tissues: Visualized vertebral artery flow voids are maintained. Disc levels: C2-C3: No significant disc protrusion, foraminal stenosis, or canal stenosis. C3-C4: Small posterior disc osteophyte complex with mild bilateral facet arthropathy. No significant canal or foraminal stenosis. C4-C5: Small posterior disc osteophyte complex with right greater than left facet arthropathy. Resulting mild right foraminal stenosis. Patent canal and left foramen. C5-C6: Posterior disc osteophyte complex with right  greater than left facet and uncovertebral hypertrophy. Resulting mild right foraminal stenosis. Mild canal stenosis. Patent left foramen. C6-C7: Small posterior disc osteophyte complex and bilateral facet arthropathy. Mild canal stenosis. No significant foraminal stenosis C7-T1: No significant disc protrusion, foraminal stenosis, or canal stenosis. IMPRESSION: 1. No evidence of acute intracranial abnormality. 2. Multilevel degenerative change (detailed above) without high-grade canal or foraminal stenosis. Electronically  Signed   By: Margaretha Sheffield M.D.   On: 11/15/2021 15:01   CT HEAD WO CONTRAST  Result Date: 11/15/2021 CLINICAL DATA:  Stroke follow-up Numbness and pain in right arm EXAM: CT HEAD WITHOUT CONTRAST TECHNIQUE: Contiguous axial images were obtained from the base of the skull through the vertex without intravenous contrast. RADIATION DOSE REDUCTION: This exam was performed according to the departmental dose-optimization program which includes automated exposure control, adjustment of the mA and/or kV according to patient size and/or use of iterative reconstruction technique. COMPARISON:  None available FINDINGS: Brain: No evidence of acute infarction, hemorrhage, hydrocephalus, extra-axial collection or mass lesion/mass effect. Vascular: No hyperdense vessel or unexpected calcification. Skull: Normal. Negative for fracture or focal lesion. Sinuses/Orbits: No acute finding. Other: None. IMPRESSION: No acute intracranial abnormality. Electronically Signed   By: Miachel Roux M.D.   On: 11/15/2021 11:00     PROCEDURES:  Critical Care performed: No  Procedures   MEDICATIONS ORDERED IN ED: Medications  lidocaine (LIDODERM) 5 % 1 patch (1 patch Transdermal Patch Applied 11/15/21 1631)  cyclobenzaprine (FLEXERIL) tablet 10 mg (10 mg Oral Given 11/15/21 1338)  predniSONE (DELTASONE) tablet 60 mg (60 mg Oral Given 11/15/21 1632)  ondansetron (ZOFRAN-ODT) disintegrating tablet 4 mg (4 mg Oral Given 11/15/21 1632)  HYDROcodone-acetaminophen (NORCO/VICODIN) 5-325 MG per tablet 1 tablet (1 tablet Oral Given 11/15/21 1632)     IMPRESSION / MDM / ASSESSMENT AND PLAN / ED COURSE  I reviewed the triage vital signs and the nursing notes.                              Differential diagnosis includes, but is not limited to, short, TIA, cervical radiculopathy, rotator cuff tendinitis, cervical compression injury  Patient's presentation is most consistent with acute complicated illness / injury requiring  diagnostic workup.  Geriatric patient to the ED for evaluation of several weeks of left-sided neck pain and left upper extremity radicular symptoms.  Patient presents in no acute distress from home.  Initial work-up is reassuring as a Labs show no acute abnormalities to include leukocytosis or electrolyte abnormalities.  Further evaluation with a CT of the head does not reveal any acute hemorrhagic process.  Given patient's ongoing symptoms and left upper extremity weakness, it was decided to proceed with an MRI of the brain as well as cervical spine.  No acute MRI brain findings to indicate the patient's symptoms.  MRI cervical spine did not reveal any acute spinal cord compression or spinal nerve injury.  Patient symptoms may represent musculoskeletal etiology at this time given her reproducible symptoms.  Patient's diagnosis is consistent with paresthesias and cervical strain on the left. Patient will be discharged home with prescriptions for prednisone, lido-Derm patches, methocarbamol, and hydrocodone. Patient is to follow up with neurology as needed or otherwise directed. Patient is given ED precautions to return to the ED for any worsening or new symptoms.     FINAL CLINICAL IMPRESSION(S) / ED DIAGNOSES   Final diagnoses:  Paresthesias  Cervical strain, acute, initial encounter  Rx / DC Orders   ED Discharge Orders          Ordered    lidocaine (LIDODERM) 5 %  Every 12 hours        11/15/21 1531    predniSONE (DELTASONE) 20 MG tablet  Daily with breakfast        11/15/21 1531    methocarbamol (ROBAXIN) 500 MG tablet  3 times daily        11/15/21 1531    HYDROcodone-acetaminophen (NORCO) 5-325 MG tablet  3 times daily PRN        11/15/21 1531             Note:  This document was prepared using Dragon voice recognition software and may include unintentional dictation errors.    Melvenia Needles, PA-C 11/15/21 1859    Duffy Bruce, MD 11/17/21 2110

## 2021-11-15 NOTE — Discharge Instructions (Signed)
Your exam, labs, CT, and MRI are all normal and reassuring at this time.  No signs of a stroke, or any herniated disc causing any nerve impingement.  Your symptoms may be due to musculoskeletal strain.  Take the prescription meds as directed, including the steroid.  Monitor your blood sugars closely as they would likely be affected by the prednisone.  Take the pain medicine as needed and the muscle relaxant as directed.  Follow-up with your primary provider or your neurologist for ongoing symptoms.

## 2021-11-28 DIAGNOSIS — G5603 Carpal tunnel syndrome, bilateral upper limbs: Secondary | ICD-10-CM | POA: Diagnosis not present

## 2021-11-28 DIAGNOSIS — M542 Cervicalgia: Secondary | ICD-10-CM | POA: Diagnosis not present

## 2021-11-28 DIAGNOSIS — R200 Anesthesia of skin: Secondary | ICD-10-CM | POA: Diagnosis not present

## 2021-11-29 ENCOUNTER — Other Ambulatory Visit: Payer: Self-pay | Admitting: Student

## 2021-11-29 DIAGNOSIS — R200 Anesthesia of skin: Secondary | ICD-10-CM

## 2021-12-03 DIAGNOSIS — E1169 Type 2 diabetes mellitus with other specified complication: Secondary | ICD-10-CM | POA: Diagnosis not present

## 2021-12-03 DIAGNOSIS — I10 Essential (primary) hypertension: Secondary | ICD-10-CM

## 2021-12-10 ENCOUNTER — Telehealth: Payer: Medicare Other

## 2021-12-10 ENCOUNTER — Ambulatory Visit: Payer: Self-pay

## 2021-12-10 ENCOUNTER — Ambulatory Visit
Admission: RE | Admit: 2021-12-10 | Discharge: 2021-12-10 | Disposition: A | Discharge status: Home / Self Care | Payer: Medicare Other | Source: Ambulatory Visit | Attending: Student | Admitting: Student

## 2021-12-10 DIAGNOSIS — R200 Anesthesia of skin: Secondary | ICD-10-CM | POA: Insufficient documentation

## 2021-12-10 DIAGNOSIS — I6523 Occlusion and stenosis of bilateral carotid arteries: Secondary | ICD-10-CM | POA: Diagnosis not present

## 2021-12-10 NOTE — Patient Instructions (Signed)
Visit Information  Thank you for taking time to visit with me today. Please don't hesitate to contact me if I can be of assistance to you.   Following are the goals we discussed today:   Goals Addressed             This Visit's Progress    RNCM: Effective Management of Chronic Pain       Care Coordination Interventions: Patient rates her neck and arm pain at a 5 today on a scale of 0-10 Reviewed provider established plan for pain management. 12-10-2021: The patient was on her way with her daughter to have an ultrasound of her neck today to see if they can determine why she is having so much pain. The patient states her pain level is better than it was when she went to the ER for evaluation. The patient states she can tolerate it much better but wants to know what is going on so they can help her. Reflective listening and support given. Discussed importance of adherence to all scheduled medical appointments. 12-10-2021: Having testing today and follow ups with the specialist coming up Counseled on the importance of reporting any/all new or changed pain symptoms or management strategies to pain management provider. 12-10-2021: Education and support given Advised patient to report to care team affect of pain on daily activities. 12-10-2021: The patient has referral in place for PT, OT, and in home health. The patient states compliance with the plan of care.  Discussed use of relaxation techniques and/or diversional activities to assist with pain reduction (distraction, imagery, relaxation, massage, acupressure, TENS, heat, and cold application. 12-10-2021: Education and support. Will attach information on ways to help with pain relief Reviewed with patient prescribed pharmacological and nonpharmacological pain relief strategies. 12-10-2021: Education and support given Advised patient to discuss changes in level and intensity of pain and unresolved pain  with provider Screening for signs and symptoms of  depression related to chronic disease state  Assessed social determinant of health barriers. 12-10-2021: Education and review AWV completed on 05-18-2021  RICE Therapy for Routine Care of Injuries Many injuries can be cared for with rest, ice, compression, and elevation (RICE therapy). This includes: Resting the injured body part. Putting ice on the injury. Putting pressure (compression) on the injury. Raising the injured part (elevation). Using RICE therapy can help to lessen pain and swelling. Supplies needed: Ice. Plastic bag. Towel. Elastic bandage. Pillow or pillows to raise your injured body part. How to care for your injury with RICE therapy Rest Try to rest the injured part of your body. You can go back to your normal activities when your doctor says it is okay to do them and when you can do them without pain. If you rest the injury too much, it may not heal as well. Some injuries heal better with early movement instead of resting for too long. Ask your doctor if you should do exercises to help your injury get better. Ice  If told, put ice on the injured area. To do this: Put ice in a plastic bag. Place a towel between your skin and the bag. Leave the ice on for 20 minutes, 2-3 times a day. Take off the ice if your skin turns bright red. This is very important. If you cannot feel pain, heat, or cold, you have a greater risk of damage to the area. Do not put ice on your bare skin. Use ice for as many days as your doctor tells you to  use it. Compression Put pressure on the injured area. This can be done with an elastic bandage. If this type of bandage has been put on your injury: Follow instructions on the package the bandage came in about how to use it. Do not wrap the bandage too tightly. Wrap the bandage more loosely if part of your body beyond the bandage is blue, swollen, cold, painful, or loses feeling. Take off the bandage and put it on again every 3-4 hours or as told by  your doctor. See your doctor if the bandage seems to make your problems worse.  Elevation Raise the injured area above the level of your heart while you are sitting or lying down. Follow these instructions at home: If your symptoms get worse or last a long time, make a follow-up appointment with your doctor. You may need to have imaging tests, such as X-rays or an MRI. If you have imaging tests, ask how to get your results when they are ready. Return to your normal activities when your doctor says that it is safe. Keep all follow-up visits. Contact a doctor if: You keep having pain and swelling. Your symptoms get worse. Get help right away if: You have sudden, very bad pain at your injury or lower than your injury. You have redness or more swelling around your injury. You have tingling or numbness at your injury or lower than your injury, and it does not go away when you take off the bandage. Summary Many injuries can be cared for using rest, ice, compression, and elevation (RICE therapy). You can go back to your normal activities when your doctor says it is okay and when you can do them without pain. Put ice on the injured area as told by your doctor. Get help if your symptoms get worse or if you keep having pain and swelling. This information is not intended to replace advice given to you by your health care provider. Make sure you discuss any questions you have with your health care provider. Document Revised: 02/10/2020 Document Reviewed: 02/10/2020 Elsevier Patient Education  [place]. Chronic Pain, Adult Chronic pain is a type of pain that lasts or keeps coming back for at least 3-6 months. You may have headaches, pain in the abdomen, or pain in other areas of the body. Chronic pain may be related to an illness, such as fibromyalgia or complex regional pain syndrome. Chronic pain may also be related to an injury or a health condition. Sometimes, the cause of chronic pain is  not known. Chronic pain can make it hard for you to do daily activities. If not treated, chronic pain can lead to anxiety and depression. Treatment depends on the cause and severity of your pain. You may need to work with a pain specialist to come up with a treatment plan. The plan may include medicine, counseling, and physical therapy. Many people benefit from a combination of two or more types of treatment to control their pain. Follow these instructions at home: Medicines Take over-the-counter and prescription medicines only as told by your health care provider. Ask your health care provider if the medicine prescribed to you: Requires you to avoid driving or using machinery. Can cause constipation. You may need to take these actions to prevent or treat constipation: Drink enough fluid to keep your urine pale yellow. Take over-the-counter or prescription medicines. Eat foods that are high in fiber, such as beans, whole grains, and fresh fruits and vegetables. Limit foods that are high  in fat and processed sugars, such as fried or sweet foods. Treatment plan Follow your treatment plan as told by your health care provider. This may include: Gentle, regular exercise. Eating a healthy diet that includes foods such as vegetables, fruits, fish, and lean meats. Cognitive or behavioral therapy that changes the way you think or act in response to the pain. This may help improve how you feel. Working with a physical therapist. Meditation, yoga, acupuncture, or massage therapy. Aroma, color, light, or sound therapy. Local electrical stimulation. The electrical pulses help to relieve pain by temporarily stopping the nerve impulses that cause you to feel pain. Injections. These deliver numbing or pain-relieving medicines into the spine or the area of pain.  Lifestyle  Ask your health care provider whether you should keep a pain diary. Your health care provider will tell you what information to write in  the diary. This may include when you have pain, what the pain feels like, and how medicines and other behaviors or treatments help to reduce the pain. Consider talking with a mental health care provider about how to manage chronic pain. Consider joining a chronic pain support group. Try to control or lower your stress levels. Talk with your health care provider about ways to do this. General instructions Learn as much as you can about how to manage your chronic pain. Ask your health care provider if an intensive pain rehabilitation program or a chronic pain specialist would be helpful. Check your pain level as told by your health care provider. Ask your health care provider if you should use a pain scale. It is up to you to get the results of any tests that were done. Ask your health care provider, or the department that is doing the tests, when your results will be ready. Keep all follow-up visits as told by your health care provider. This is important. Contact a health care provider if: Your pain gets worse, or you have new pain. You have trouble sleeping. You have trouble doing your normal activities. Your pain is not controlled with treatment. You have side effects from pain medicine. You feel weak. You notice any other changes that show that your condition is getting worse. Get help right away if: You lose feeling or have numbness in your body. You lose control of bowel or bladder function. Your pain suddenly gets much worse. You develop shaking or chills. You develop confusion. You develop chest pain. You have trouble breathing or shortness of breath. You pass out. You have thoughts about hurting yourself or others. If you ever feel like you may hurt yourself or others, or have thoughts about taking your own life, get help right away. Go to your nearest emergency department or: Call your local emergency services (911 in the [place] Call a suicide crisis helpline, such as the  [place] at (343)313-6713 or 988 in the [place]. This is open 24 hours a day in the [place] Text the Crisis Text Line at 778-608-5398 (in the [place].). Summary Chronic pain is a type of pain that lasts or keeps coming back for at least 3-6 months. Chronic pain may be related to an illness, injury, or other health condition. Sometimes, the cause of chronic pain is not known. Treatment depends on the cause and severity of your pain. Many people benefit from a combination of two or more types of treatment to control their pain. Follow your treatment plan as told by your health care provider. This information is not  intended to replace advice given to you by your health care provider. Make sure you discuss any questions you have with your health care provider. Document Revised: 09/06/2021 Document Reviewed: 01/07/2019 Elsevier Patient Education  [place] next appointment is by telephone on 01-21-2022 at 1 pm  Please call the care guide team at [phone] if you need to cancel or reschedule your appointment.   If you are experiencing a Mental Health or [place] or need someone to talk to, please call the Suicide and Crisis Lifeline: 988 call the [place] National Suicide Prevention Lifeline: (507)746-5877 or TTY: 681-883-9411 TTY (317)233-9777) to talk to a trained counselor call 1-800-273-TALK (toll free, 24 hour hotline)  Patient verbalizes understanding of instructions and care plan provided today and agrees to view in [place]. Active MyChart status and patient understanding of how to access instructions and care plan via MyChart confirmed with patient.     Telephone follow up appointment with care management team member scheduled for: 01-21-2022 at 1 pm  [place], MSN, [facility] Mobile: [phone]

## 2021-12-10 NOTE — Patient Outreach (Signed)
Care Coordination   Follow Up Visit Note   12/10/2021 Name: Diana Harmon MRN: [id number] DOB: [birth year]  Diana Harmon is a 76 y.o. year old female who sees [name], DO for primary care. I spoke with  Diana Harmon by phone today  What matters to the patients health and wellness today?  What is going on with her neck and arm pain    Goals Addressed             This Visit's Progress    RNCM: Effective Management of Chronic Pain       Care Coordination Interventions: Patient rates her neck and arm pain at a 5 today on a scale of 0-10 Reviewed provider established plan for pain management. 12-10-2021: The patient was on her way with her daughter to have an ultrasound of her neck today to see if they can determine why she is having so much pain. The patient states her pain level is better than it was when she went to the ER for evaluation. The patient states she can tolerate it much better but wants to know what is going on so they can help her. Reflective listening and support given. Discussed importance of adherence to all scheduled medical appointments. 12-10-2021: Having testing today and follow ups with the specialist coming up Counseled on the importance of reporting any/all new or changed pain symptoms or management strategies to pain management provider. 12-10-2021: Education and support given Advised patient to report to care team affect of pain on daily activities. 12-10-2021: The patient has referral in place for PT, OT, and in home health. The patient states compliance with the plan of care.  Discussed use of relaxation techniques and/or diversional activities to assist with pain reduction (distraction, imagery, relaxation, massage, acupressure, TENS, heat, and cold application. 12-10-2021: Education and support. Will attach information on ways to help with pain relief Reviewed with patient prescribed pharmacological and nonpharmacological pain  relief strategies. 12-10-2021: Education and support given Advised patient to discuss changes in level and intensity of pain and unresolved pain  with provider Screening for signs and symptoms of depression related to chronic disease state  Assessed social determinant of health barriers. 12-10-2021: Education and review AWV completed on 05-18-2021  RICE Therapy for Routine Care of Injuries Many injuries can be cared for with rest, ice, compression, and elevation (RICE therapy). This includes: Resting the injured body part. Putting ice on the injury. Putting pressure (compression) on the injury. Raising the injured part (elevation). Using RICE therapy can help to lessen pain and swelling. Supplies needed: Ice. Plastic bag. Towel. Elastic bandage. Pillow or pillows to raise your injured body part. How to care for your injury with RICE therapy Rest Try to rest the injured part of your body. You can go back to your normal activities when your doctor says it is okay to do them and when you can do them without pain. If you rest the injury too much, it may not heal as well. Some injuries heal better with early movement instead of resting for too long. Ask your doctor if you should do exercises to help your injury get better. Ice  If told, put ice on the injured area. To do this: Put ice in a plastic bag. Place a towel between your skin and the bag. Leave the ice on for 20 minutes, 2-3 times a day. Take off the ice if your skin turns bright red. This is very important. If you cannot  feel pain, heat, or cold, you have a greater risk of damage to the area. Do not put ice on your bare skin. Use ice for as many days as your doctor tells you to use it. Compression Put pressure on the injured area. This can be done with an elastic bandage. If this type of bandage has been put on your injury: Follow instructions on the package the bandage came in about how to use it. Do not wrap the bandage too  tightly. Wrap the bandage more loosely if part of your body beyond the bandage is blue, swollen, cold, painful, or loses feeling. Take off the bandage and put it on again every 3-4 hours or as told by your doctor. See your doctor if the bandage seems to make your problems worse.  Elevation Raise the injured area above the level of your heart while you are sitting or lying down. Follow these instructions at home: If your symptoms get worse or last a long time, make a follow-up appointment with your doctor. You may need to have imaging tests, such as X-rays or an MRI. If you have imaging tests, ask how to get your results when they are ready. Return to your normal activities when your doctor says that it is safe. Keep all follow-up visits. Contact a doctor if: You keep having pain and swelling. Your symptoms get worse. Get help right away if: You have sudden, very bad pain at your injury or lower than your injury. You have redness or more swelling around your injury. You have tingling or numbness at your injury or lower than your injury, and it does not go away when you take off the bandage. Summary Many injuries can be cared for using rest, ice, compression, and elevation (RICE therapy). You can go back to your normal activities when your doctor says it is okay and when you can do them without pain. Put ice on the injured area as told by your doctor. Get help if your symptoms get worse or if you keep having pain and swelling. This information is not intended to replace advice given to you by your health care provider. Make sure you discuss any questions you have with your health care provider. Document Revised: 02/10/2020 Document Reviewed: 02/10/2020 Elsevier Patient Education  [place]. Chronic Pain, Adult Chronic pain is a type of pain that lasts or keeps coming back for at least 3-6 months. You may have headaches, pain in the abdomen, or pain in other areas of the body.  Chronic pain may be related to an illness, such as fibromyalgia or complex regional pain syndrome. Chronic pain may also be related to an injury or a health condition. Sometimes, the cause of chronic pain is not known. Chronic pain can make it hard for you to do daily activities. If not treated, chronic pain can lead to anxiety and depression. Treatment depends on the cause and severity of your pain. You may need to work with a pain specialist to come up with a treatment plan. The plan may include medicine, counseling, and physical therapy. Many people benefit from a combination of two or more types of treatment to control their pain. Follow these instructions at home: Medicines Take over-the-counter and prescription medicines only as told by your health care provider. Ask your health care provider if the medicine prescribed to you: Requires you to avoid driving or using machinery. Can cause constipation. You may need to take these actions to prevent or treat constipation: Drink  enough fluid to keep your urine pale yellow. Take over-the-counter or prescription medicines. Eat foods that are high in fiber, such as beans, whole grains, and fresh fruits and vegetables. Limit foods that are high in fat and processed sugars, such as fried or sweet foods. Treatment plan Follow your treatment plan as told by your health care provider. This may include: Gentle, regular exercise. Eating a healthy diet that includes foods such as vegetables, fruits, fish, and lean meats. Cognitive or behavioral therapy that changes the way you think or act in response to the pain. This may help improve how you feel. Working with a physical therapist. Meditation, yoga, acupuncture, or massage therapy. Aroma, color, light, or sound therapy. Local electrical stimulation. The electrical pulses help to relieve pain by temporarily stopping the nerve impulses that cause you to feel pain. Injections. These deliver numbing or  pain-relieving medicines into the spine or the area of pain.  Lifestyle  Ask your health care provider whether you should keep a pain diary. Your health care provider will tell you what information to write in the diary. This may include when you have pain, what the pain feels like, and how medicines and other behaviors or treatments help to reduce the pain. Consider talking with a mental health care provider about how to manage chronic pain. Consider joining a chronic pain support group. Try to control or lower your stress levels. Talk with your health care provider about ways to do this. General instructions Learn as much as you can about how to manage your chronic pain. Ask your health care provider if an intensive pain rehabilitation program or a chronic pain specialist would be helpful. Check your pain level as told by your health care provider. Ask your health care provider if you should use a pain scale. It is up to you to get the results of any tests that were done. Ask your health care provider, or the department that is doing the tests, when your results will be ready. Keep all follow-up visits as told by your health care provider. This is important. Contact a health care provider if: Your pain gets worse, or you have new pain. You have trouble sleeping. You have trouble doing your normal activities. Your pain is not controlled with treatment. You have side effects from pain medicine. You feel weak. You notice any other changes that show that your condition is getting worse. Get help right away if: You lose feeling or have numbness in your body. You lose control of bowel or bladder function. Your pain suddenly gets much worse. You develop shaking or chills. You develop confusion. You develop chest pain. You have trouble breathing or shortness of breath. You pass out. You have thoughts about hurting yourself or others. If you ever feel like you may hurt yourself or others, or  have thoughts about taking your own life, get help right away. Go to your nearest emergency department or: Call your local emergency services (911 in the [place] Call a suicide crisis helpline, such as the [place] at (812)803-7338 or 988 in the [place]. This is open 24 hours a day in the [place] Text the Crisis Text Line at 414-640-2414 (in the [place].). Summary Chronic pain is a type of pain that lasts or keeps coming back for at least 3-6 months. Chronic pain may be related to an illness, injury, or other health condition. Sometimes, the cause of chronic pain is not known. Treatment depends on the cause and severity  of your pain. Many people benefit from a combination of two or more types of treatment to control their pain. Follow your treatment plan as told by your health care provider. This information is not intended to replace advice given to you by your health care provider. Make sure you discuss any questions you have with your health care provider. Document Revised: 09/06/2021 Document Reviewed: 01/07/2019 Elsevier Patient Education  [place] assessments and interventions completed:  Yes  SDOH Interventions Today    Flowsheet Row Most Recent Value  SDOH Interventions   Food Insecurity Interventions Intervention Not Indicated  Financial Strain Interventions Intervention Not Indicated  Housing Interventions Intervention Not Indicated  Physical Activity Interventions Other (Comments)  [patient is working with specialist due to issues with muscles in neck and arm. She is limited in what she can do currently with being active]  Stress Interventions Intervention Not Indicated  [concerned over what is going on with her neck and arm muscles]  Social Connections Interventions Intervention Not Indicated  Transportation Interventions Intervention Not Indicated        Care Coordination Interventions Activated:  Yes  Care Coordination  Interventions:  Yes, provided   Follow up plan: Follow up call scheduled for 01-21-2022 at 1 pm    Encounter Outcome:  Pt. Visit Completed   Noreene Larsson RN, MSN, [facility] Mobile: [phone]

## 2021-12-15 ENCOUNTER — Other Ambulatory Visit: Payer: Self-pay | Admitting: Family Medicine

## 2021-12-15 DIAGNOSIS — E7800 Pure hypercholesterolemia, unspecified: Secondary | ICD-10-CM

## 2021-12-17 NOTE — Telephone Encounter (Signed)
Requested Prescriptions  Pending Prescriptions Disp Refills  . simvastatin (ZOCOR) 20 MG tablet [Pharmacy Med Name: Simvastatin 20 MG Oral Tablet] 90 tablet 1    Sig: TAKE 1 TABLET BY MOUTH ONCE DAILY AT 6 PM     Cardiovascular:  Antilipid - Statins Failed - 12/15/2021  4:46 PM      Failed - Lipid Panel in normal range within the last 12 months    Cholesterol, Total  Date Value Ref Range Status  05/03/2020 170 100 - 199 mg/dL Final   Cholesterol  Date Value Ref Range Status  04/20/2021 155 <200 mg/dL Final   LDL Cholesterol (Calc)  Date Value Ref Range Status  04/20/2021 72 mg/dL (calc) Final    Comment:    Reference range: <100 . Desirable range <100 mg/dL for primary prevention;   <70 mg/dL for patients with CHD or diabetic patients  with > or = 2 CHD risk factors. Marland Kitchen LDL-C is now calculated using the Martin-Hopkins  calculation, which is a validated novel method providing  better accuracy than the Friedewald equation in the  estimation of LDL-C.  Cresenciano Genre et al. Annamaria Helling. 1696;789(38): 2061-2068  (http://education.[facility].com/faq/FAQ164)    HDL  Date Value Ref Range Status  04/20/2021 65 > OR = 50 mg/dL Final  05/03/2020 54 >39 mg/dL Final   Triglycerides  Date Value Ref Range Status  04/20/2021 95 <150 mg/dL Final         Passed - Patient is not pregnant      Passed - Valid encounter within last 12 months    Recent Outpatient Visits          1 month ago Type 2 diabetes mellitus with other specified complication, without long-term current use of insulin ([place])   [facility], Devonne Doughty, DO   7 months ago Annual physical exam   [facility] [name], DO   1 year ago Type 2 diabetes mellitus with other specified complication, without long-term current use of insulin [facility])   [facility], Devonne Doughty, DO   1 year ago Type 2 diabetes mellitus with other specified  complication, without long-term current use of insulin [facility])   [facility], Devonne Doughty, DO   1 year ago Type 2 diabetes mellitus with other specified complication, without long-term current use of insulin ([place])   [facility], Devonne Doughty, DO      Future Appointments            In 4 months [facility], Devonne Doughty, DO [facility], [facility]

## 2021-12-24 DIAGNOSIS — M542 Cervicalgia: Secondary | ICD-10-CM | POA: Diagnosis not present

## 2021-12-24 DIAGNOSIS — M5412 Radiculopathy, cervical region: Secondary | ICD-10-CM | POA: Diagnosis not present

## 2021-12-24 DIAGNOSIS — M9931 Osseous stenosis of neural canal of cervical region: Secondary | ICD-10-CM | POA: Diagnosis not present

## 2021-12-25 DIAGNOSIS — D631 Anemia in chronic kidney disease: Secondary | ICD-10-CM | POA: Diagnosis not present

## 2021-12-25 DIAGNOSIS — Z7982 Long term (current) use of aspirin: Secondary | ICD-10-CM | POA: Diagnosis not present

## 2021-12-25 DIAGNOSIS — E785 Hyperlipidemia, unspecified: Secondary | ICD-10-CM | POA: Diagnosis not present

## 2021-12-25 DIAGNOSIS — N1832 Chronic kidney disease, stage 3b: Secondary | ICD-10-CM | POA: Diagnosis not present

## 2021-12-25 DIAGNOSIS — I129 Hypertensive chronic kidney disease with stage 1 through stage 4 chronic kidney disease, or unspecified chronic kidney disease: Secondary | ICD-10-CM | POA: Diagnosis not present

## 2021-12-25 DIAGNOSIS — E890 Postprocedural hypothyroidism: Secondary | ICD-10-CM | POA: Diagnosis not present

## 2021-12-25 DIAGNOSIS — Z7985 Long-term (current) use of injectable non-insulin antidiabetic drugs: Secondary | ICD-10-CM | POA: Diagnosis not present

## 2021-12-25 DIAGNOSIS — E1122 Type 2 diabetes mellitus with diabetic chronic kidney disease: Secondary | ICD-10-CM | POA: Diagnosis not present

## 2021-12-25 DIAGNOSIS — Z9181 History of falling: Secondary | ICD-10-CM | POA: Diagnosis not present

## 2021-12-25 DIAGNOSIS — M4722 Other spondylosis with radiculopathy, cervical region: Secondary | ICD-10-CM | POA: Diagnosis not present

## 2021-12-25 DIAGNOSIS — M1990 Unspecified osteoarthritis, unspecified site: Secondary | ICD-10-CM | POA: Diagnosis not present

## 2021-12-25 DIAGNOSIS — Z952 Presence of prosthetic heart valve: Secondary | ICD-10-CM | POA: Diagnosis not present

## 2021-12-25 DIAGNOSIS — G8929 Other chronic pain: Secondary | ICD-10-CM | POA: Diagnosis not present

## 2021-12-27 ENCOUNTER — Other Ambulatory Visit: Payer: Self-pay | Admitting: Internal Medicine

## 2021-12-27 NOTE — Telephone Encounter (Signed)
*  STAT* If patient is at the pharmacy, call can be transferred to refill team.   1. Which medications need to be refilled? (please list name of each medication and dose if known) need a new prescription for her Furosemide  2. Which pharmacy/location (including street and city if local pharmacy) is medication to be sent to? [facility]  3. Do they need a 30 day or 90 day supply? 90 days and refills

## 2021-12-28 DIAGNOSIS — E1122 Type 2 diabetes mellitus with diabetic chronic kidney disease: Secondary | ICD-10-CM | POA: Diagnosis not present

## 2021-12-28 DIAGNOSIS — R899 Unspecified abnormal finding in specimens from other organs, systems and tissues: Secondary | ICD-10-CM | POA: Diagnosis not present

## 2021-12-28 DIAGNOSIS — M1990 Unspecified osteoarthritis, unspecified site: Secondary | ICD-10-CM | POA: Diagnosis not present

## 2021-12-28 DIAGNOSIS — Z952 Presence of prosthetic heart valve: Secondary | ICD-10-CM | POA: Diagnosis not present

## 2021-12-28 DIAGNOSIS — D631 Anemia in chronic kidney disease: Secondary | ICD-10-CM | POA: Diagnosis not present

## 2021-12-28 DIAGNOSIS — N1832 Chronic kidney disease, stage 3b: Secondary | ICD-10-CM | POA: Diagnosis not present

## 2021-12-28 DIAGNOSIS — G8929 Other chronic pain: Secondary | ICD-10-CM | POA: Diagnosis not present

## 2021-12-28 DIAGNOSIS — M4722 Other spondylosis with radiculopathy, cervical region: Secondary | ICD-10-CM | POA: Diagnosis not present

## 2021-12-28 DIAGNOSIS — Z7982 Long term (current) use of aspirin: Secondary | ICD-10-CM | POA: Diagnosis not present

## 2021-12-28 DIAGNOSIS — E785 Hyperlipidemia, unspecified: Secondary | ICD-10-CM | POA: Diagnosis not present

## 2021-12-28 DIAGNOSIS — I129 Hypertensive chronic kidney disease with stage 1 through stage 4 chronic kidney disease, or unspecified chronic kidney disease: Secondary | ICD-10-CM | POA: Diagnosis not present

## 2021-12-28 DIAGNOSIS — Z9181 History of falling: Secondary | ICD-10-CM | POA: Diagnosis not present

## 2021-12-28 DIAGNOSIS — E890 Postprocedural hypothyroidism: Secondary | ICD-10-CM | POA: Diagnosis not present

## 2021-12-28 DIAGNOSIS — Z7985 Long-term (current) use of injectable non-insulin antidiabetic drugs: Secondary | ICD-10-CM | POA: Diagnosis not present

## 2022-01-02 DIAGNOSIS — E785 Hyperlipidemia, unspecified: Secondary | ICD-10-CM | POA: Diagnosis not present

## 2022-01-02 DIAGNOSIS — M4722 Other spondylosis with radiculopathy, cervical region: Secondary | ICD-10-CM | POA: Diagnosis not present

## 2022-01-02 DIAGNOSIS — G8929 Other chronic pain: Secondary | ICD-10-CM | POA: Diagnosis not present

## 2022-01-02 DIAGNOSIS — D631 Anemia in chronic kidney disease: Secondary | ICD-10-CM | POA: Diagnosis not present

## 2022-01-02 DIAGNOSIS — I129 Hypertensive chronic kidney disease with stage 1 through stage 4 chronic kidney disease, or unspecified chronic kidney disease: Secondary | ICD-10-CM | POA: Diagnosis not present

## 2022-01-02 DIAGNOSIS — Z952 Presence of prosthetic heart valve: Secondary | ICD-10-CM | POA: Diagnosis not present

## 2022-01-02 DIAGNOSIS — Z7985 Long-term (current) use of injectable non-insulin antidiabetic drugs: Secondary | ICD-10-CM | POA: Diagnosis not present

## 2022-01-02 DIAGNOSIS — E1122 Type 2 diabetes mellitus with diabetic chronic kidney disease: Secondary | ICD-10-CM | POA: Diagnosis not present

## 2022-01-02 DIAGNOSIS — N1832 Chronic kidney disease, stage 3b: Secondary | ICD-10-CM | POA: Diagnosis not present

## 2022-01-02 DIAGNOSIS — E890 Postprocedural hypothyroidism: Secondary | ICD-10-CM | POA: Diagnosis not present

## 2022-01-02 DIAGNOSIS — Z7982 Long term (current) use of aspirin: Secondary | ICD-10-CM | POA: Diagnosis not present

## 2022-01-02 DIAGNOSIS — M1990 Unspecified osteoarthritis, unspecified site: Secondary | ICD-10-CM | POA: Diagnosis not present

## 2022-01-02 DIAGNOSIS — Z9181 History of falling: Secondary | ICD-10-CM | POA: Diagnosis not present

## 2022-01-03 DIAGNOSIS — I129 Hypertensive chronic kidney disease with stage 1 through stage 4 chronic kidney disease, or unspecified chronic kidney disease: Secondary | ICD-10-CM | POA: Diagnosis not present

## 2022-01-03 DIAGNOSIS — E890 Postprocedural hypothyroidism: Secondary | ICD-10-CM | POA: Diagnosis not present

## 2022-01-03 DIAGNOSIS — E1122 Type 2 diabetes mellitus with diabetic chronic kidney disease: Secondary | ICD-10-CM | POA: Diagnosis not present

## 2022-01-03 DIAGNOSIS — Z9181 History of falling: Secondary | ICD-10-CM | POA: Diagnosis not present

## 2022-01-03 DIAGNOSIS — Z952 Presence of prosthetic heart valve: Secondary | ICD-10-CM | POA: Diagnosis not present

## 2022-01-03 DIAGNOSIS — G8929 Other chronic pain: Secondary | ICD-10-CM | POA: Diagnosis not present

## 2022-01-03 DIAGNOSIS — Z7982 Long term (current) use of aspirin: Secondary | ICD-10-CM | POA: Diagnosis not present

## 2022-01-03 DIAGNOSIS — M1990 Unspecified osteoarthritis, unspecified site: Secondary | ICD-10-CM | POA: Diagnosis not present

## 2022-01-03 DIAGNOSIS — D631 Anemia in chronic kidney disease: Secondary | ICD-10-CM | POA: Diagnosis not present

## 2022-01-03 DIAGNOSIS — N1832 Chronic kidney disease, stage 3b: Secondary | ICD-10-CM | POA: Diagnosis not present

## 2022-01-03 DIAGNOSIS — E785 Hyperlipidemia, unspecified: Secondary | ICD-10-CM | POA: Diagnosis not present

## 2022-01-03 DIAGNOSIS — Z7985 Long-term (current) use of injectable non-insulin antidiabetic drugs: Secondary | ICD-10-CM | POA: Diagnosis not present

## 2022-01-03 DIAGNOSIS — M4722 Other spondylosis with radiculopathy, cervical region: Secondary | ICD-10-CM | POA: Diagnosis not present

## 2022-01-09 DIAGNOSIS — I129 Hypertensive chronic kidney disease with stage 1 through stage 4 chronic kidney disease, or unspecified chronic kidney disease: Secondary | ICD-10-CM | POA: Diagnosis not present

## 2022-01-09 DIAGNOSIS — Z9181 History of falling: Secondary | ICD-10-CM | POA: Diagnosis not present

## 2022-01-09 DIAGNOSIS — Z7982 Long term (current) use of aspirin: Secondary | ICD-10-CM | POA: Diagnosis not present

## 2022-01-09 DIAGNOSIS — D631 Anemia in chronic kidney disease: Secondary | ICD-10-CM | POA: Diagnosis not present

## 2022-01-09 DIAGNOSIS — E890 Postprocedural hypothyroidism: Secondary | ICD-10-CM | POA: Diagnosis not present

## 2022-01-09 DIAGNOSIS — E1122 Type 2 diabetes mellitus with diabetic chronic kidney disease: Secondary | ICD-10-CM | POA: Diagnosis not present

## 2022-01-09 DIAGNOSIS — Z952 Presence of prosthetic heart valve: Secondary | ICD-10-CM | POA: Diagnosis not present

## 2022-01-09 DIAGNOSIS — M1990 Unspecified osteoarthritis, unspecified site: Secondary | ICD-10-CM | POA: Diagnosis not present

## 2022-01-09 DIAGNOSIS — Z7985 Long-term (current) use of injectable non-insulin antidiabetic drugs: Secondary | ICD-10-CM | POA: Diagnosis not present

## 2022-01-09 DIAGNOSIS — M4722 Other spondylosis with radiculopathy, cervical region: Secondary | ICD-10-CM | POA: Diagnosis not present

## 2022-01-09 DIAGNOSIS — E785 Hyperlipidemia, unspecified: Secondary | ICD-10-CM | POA: Diagnosis not present

## 2022-01-09 DIAGNOSIS — G8929 Other chronic pain: Secondary | ICD-10-CM | POA: Diagnosis not present

## 2022-01-09 DIAGNOSIS — N1832 Chronic kidney disease, stage 3b: Secondary | ICD-10-CM | POA: Diagnosis not present

## 2022-01-11 ENCOUNTER — Encounter: Payer: Self-pay | Admitting: Internal Medicine

## 2022-01-11 ENCOUNTER — Inpatient Hospital Stay: Payer: Medicare Other | Admitting: Internal Medicine

## 2022-01-11 ENCOUNTER — Inpatient Hospital Stay: Payer: Medicare Other | Attending: Internal Medicine

## 2022-01-11 DIAGNOSIS — Z79899 Other long term (current) drug therapy: Secondary | ICD-10-CM | POA: Diagnosis not present

## 2022-01-11 DIAGNOSIS — N183 Chronic kidney disease, stage 3 unspecified: Secondary | ICD-10-CM | POA: Diagnosis not present

## 2022-01-11 DIAGNOSIS — E1122 Type 2 diabetes mellitus with diabetic chronic kidney disease: Secondary | ICD-10-CM | POA: Diagnosis not present

## 2022-01-11 DIAGNOSIS — D709 Neutropenia, unspecified: Secondary | ICD-10-CM | POA: Insufficient documentation

## 2022-01-11 DIAGNOSIS — D649 Anemia, unspecified: Secondary | ICD-10-CM

## 2022-01-11 DIAGNOSIS — M4722 Other spondylosis with radiculopathy, cervical region: Secondary | ICD-10-CM | POA: Diagnosis not present

## 2022-01-11 DIAGNOSIS — M3500 Sicca syndrome, unspecified: Secondary | ICD-10-CM | POA: Diagnosis not present

## 2022-01-11 DIAGNOSIS — E785 Hyperlipidemia, unspecified: Secondary | ICD-10-CM | POA: Diagnosis not present

## 2022-01-11 DIAGNOSIS — Z803 Family history of malignant neoplasm of breast: Secondary | ICD-10-CM | POA: Diagnosis not present

## 2022-01-11 DIAGNOSIS — Z9181 History of falling: Secondary | ICD-10-CM | POA: Diagnosis not present

## 2022-01-11 DIAGNOSIS — Z7982 Long term (current) use of aspirin: Secondary | ICD-10-CM | POA: Diagnosis not present

## 2022-01-11 DIAGNOSIS — N1832 Chronic kidney disease, stage 3b: Secondary | ICD-10-CM | POA: Diagnosis not present

## 2022-01-11 DIAGNOSIS — Z952 Presence of prosthetic heart valve: Secondary | ICD-10-CM | POA: Diagnosis not present

## 2022-01-11 DIAGNOSIS — E538 Deficiency of other specified B group vitamins: Secondary | ICD-10-CM | POA: Diagnosis not present

## 2022-01-11 DIAGNOSIS — D72829 Elevated white blood cell count, unspecified: Secondary | ICD-10-CM | POA: Diagnosis not present

## 2022-01-11 DIAGNOSIS — G8929 Other chronic pain: Secondary | ICD-10-CM | POA: Diagnosis not present

## 2022-01-11 DIAGNOSIS — D631 Anemia in chronic kidney disease: Secondary | ICD-10-CM | POA: Diagnosis not present

## 2022-01-11 DIAGNOSIS — D509 Iron deficiency anemia, unspecified: Secondary | ICD-10-CM | POA: Diagnosis not present

## 2022-01-11 DIAGNOSIS — I129 Hypertensive chronic kidney disease with stage 1 through stage 4 chronic kidney disease, or unspecified chronic kidney disease: Secondary | ICD-10-CM | POA: Insufficient documentation

## 2022-01-11 DIAGNOSIS — Z7985 Long-term (current) use of injectable non-insulin antidiabetic drugs: Secondary | ICD-10-CM | POA: Insufficient documentation

## 2022-01-11 DIAGNOSIS — M1990 Unspecified osteoarthritis, unspecified site: Secondary | ICD-10-CM | POA: Diagnosis not present

## 2022-01-11 DIAGNOSIS — E890 Postprocedural hypothyroidism: Secondary | ICD-10-CM | POA: Diagnosis not present

## 2022-01-11 LAB — CBC WITH DIFFERENTIAL/PLATELET
Abs Immature Granulocytes: 0 10*3/uL (ref 0.00–0.07)
Basophils Absolute: 0 10*3/uL (ref 0.0–0.1)
Basophils Relative: 1 %
Eosinophils Absolute: 0.2 10*3/uL (ref 0.0–0.5)
Eosinophils Relative: 5 %
HCT: 35 % — ABNORMAL LOW (ref 36.0–46.0)
Hemoglobin: 10.8 g/dL — ABNORMAL LOW (ref 12.0–15.0)
Immature Granulocytes: 0 %
Lymphocytes Relative: 32 %
Lymphs Abs: 1.1 10*3/uL (ref 0.7–4.0)
MCH: 29.3 pg (ref 26.0–34.0)
MCHC: 30.9 g/dL (ref 30.0–36.0)
MCV: 94.9 fL (ref 80.0–100.0)
Monocytes Absolute: 0.4 10*3/uL (ref 0.1–1.0)
Monocytes Relative: 13 %
Neutro Abs: 1.6 10*3/uL — ABNORMAL LOW (ref 1.7–7.7)
Neutrophils Relative %: 49 %
Platelets: 163 10*3/uL (ref 150–400)
RBC: 3.69 MIL/uL — ABNORMAL LOW (ref 3.87–5.11)
RDW: 14.3 % (ref 11.5–15.5)
WBC: 3.3 10*3/uL — ABNORMAL LOW (ref 4.0–10.5)
nRBC: 0 % (ref 0.0–0.2)

## 2022-01-11 LAB — IRON AND TIBC
Iron: 89 ug/dL (ref 28–170)
Saturation Ratios: 23 % (ref 10.4–31.8)
TIBC: 395 ug/dL (ref 250–450)
UIBC: 306 ug/dL

## 2022-01-11 LAB — BASIC METABOLIC PANEL
Anion gap: 6 (ref 5–15)
BUN: 38 mg/dL — ABNORMAL HIGH (ref 8–23)
CO2: 27 mmol/L (ref 22–32)
Calcium: 9.2 mg/dL (ref 8.9–10.3)
Chloride: 108 mmol/L (ref 98–111)
Creatinine, Ser: 1.27 mg/dL — ABNORMAL HIGH (ref 0.44–1.00)
GFR, Estimated: 44 mL/min — ABNORMAL LOW (ref >60)
Glucose, Bld: 198 mg/dL — ABNORMAL HIGH (ref 70–99)
Potassium: 4 mmol/L (ref 3.5–5.1)
Sodium: 141 mmol/L (ref 135–145)

## 2022-01-11 LAB — FERRITIN: Ferritin: 39 ng/mL (ref 11–307)

## 2022-01-11 LAB — VITAMIN B12: Vitamin B-12: 1067 pg/mL — ABNORMAL HIGH (ref 180–914)

## 2022-01-11 NOTE — Progress Notes (Signed)
[place] NOTE  Patient Care Team: [name], DO as PCP - General (Family Medicine) End, Harrell Gave, MD as PCP - Cardiology (Cardiology) Cammie Sickle, MD as Consulting Physician (Internal Medicine) Lorelee Cover., MD (Ophthalmology) End, Harrell Gave, MD as Consulting Physician (Cardiology) Vanita Ingles, RN as Case Manager (General Practice) Cammie Sickle, MD as Consulting Physician (Internal Medicine) Curley Spice Virl [name], RPH-CPP as Pharmacist  CHIEF COMPLAINTS/PURPOSE OF CONSULTATION:   # AUG 2016- IRON DEFICIENCY ANEMIA-  Possible AVMs of colon [s/p Argon laser; Dr.Elliot;EGD-neg Nov 2016] s/p IV Ferriheme x2 [[id number]; ALLERGY]; Venofer IV [pre-meds]; worsening anemia not responding to iron- November 2020-bone marrow biopsy-erythroid hyperplasia but no dysplasia no blasts. NOV 2020- CT A/P-negative; CXR-NEG.  #Relative B12 deficiency-sublingual B12 [Jan 2021]  #December 2020 -elevated anti-CCP- [s/p evaluation Dr. Elder Negus  # MILD LEUCOPENIA/NEUTROPENIA [facility] 1.2-1.3]-chronic ethnic neutropenia/asymptomatic  # Rectal well diff carcinoid [1.2CM; s/p polypectomy 2011]; Allergy- Kirtland Bouchard [Nov 2016]- neck/face swelling [improved with benadryl]; June 2021- TAVR for severe AS  HISTORY OF PRESENTING ILLNESS: Patient accompanied by daughter.  In a wheelchair.  Diana Harmon 76 y.o.  female is here for follow-up for anemia of unclear etiology is here for follow-up.  No new shortness of breath no cough.  No weight loss no blood in stools or black or stools.  Patient continues to be compliant with her iron tablets.  Review of Systems  Constitutional:  Negative for chills, diaphoresis, fever and weight loss.  HENT:  Negative for nosebleeds and sore throat.   Eyes:  Negative for double vision.  Respiratory:  Negative for cough, hemoptysis, sputum production, shortness of breath and wheezing.   Cardiovascular:   Negative for chest pain, palpitations, orthopnea and leg swelling.  Gastrointestinal:  Negative for abdominal pain, blood in stool, constipation, diarrhea, heartburn, melena, nausea and vomiting.  Genitourinary:  Negative for dysuria, frequency and urgency.  Musculoskeletal:  Positive for joint pain. Negative for back pain.  Skin: Negative.  Negative for itching and rash.  Neurological:  Negative for dizziness, tingling, focal weakness, weakness and headaches.  Endo/Heme/Allergies:  Does not bruise/bleed easily.  Psychiatric/Behavioral:  Negative for depression. The patient is not nervous/anxious and does not have insomnia.      MEDICAL HISTORY:  Past Medical History:  Diagnosis Date   Anemia    Arthritis    legs, knees, back, wrists   Diabetes mellitus without complication (HCC)    Hypertension    Nausea    Patient is [name]    S/P TAVR (transcatheter aortic valve replacement) 11/09/2019   s/p TAVR with a 26 mm Edwards S3U via the TF approach by Dr. Angelena Form & Bartle   Severe aortic stenosis    SIRS (systemic inflammatory response syndrome) ([place]) 11/2019   Sjogren's disease ([place])    Thyroid disease     SURGICAL HISTORY: Past Surgical History:  Procedure Laterality Date   CARDIAC CATHETERIZATION  10/19/2019   COLON SURGERY     caudarized polyps; rectal carcinoid excision ~ 2011   COLONOSCOPY     COLONOSCOPY WITH PROPOFOL N/A 03/13/2015   Procedure: COLONOSCOPY WITH PROPOFOL;  Surgeon: Manya Silvas, MD;  Location: [place];  Service: Endoscopy;  Laterality: N/A;   ESOPHAGOGASTRODUODENOSCOPY N/A 03/13/2015   Procedure: ESOPHAGOGASTRODUODENOSCOPY (EGD);  Surgeon: Manya Silvas, MD;  Location: [facility] ENDOSCOPY;  Service: Endoscopy;  Laterality: N/A;   INTRAOPERATIVE TRANSTHORACIC ECHOCARDIOGRAM N/A 11/09/2019   Procedure: INTRAOPERATIVE TRANSTHORACIC ECHOCARDIOGRAM;  Surgeon: Burnell Blanks, MD;  Location:  [place] OR;  Service: Open Heart Surgery;   Laterality: N/A;   PARATHYROIDECTOMY  11/20/2004   right superior parathyroidecomy for primary hyperparathyroidism/parathyroid adenoma   RIGHT/LEFT HEART CATH AND CORONARY ANGIOGRAPHY N/A 10/19/2019   Procedure: RIGHT/LEFT HEART CATH AND CORONARY ANGIOGRAPHY;  Surgeon: Nelva Bush, MD;  Location: [place] [facility];  Service: Cardiovascular;  Laterality: N/A;   THYROIDECTOMY     TRANSCATHETER AORTIC VALVE REPLACEMENT, TRANSFEMORAL N/A 11/09/2019   Procedure: TRANSCATHETER AORTIC VALVE REPLACEMENT, TRANSFEMORAL;  Surgeon: Burnell Blanks, MD;  Location: [place];  Service: Open Heart Surgery;  Laterality: N/A;    SOCIAL HISTORY: Social History   Socioeconomic History   Marital status: Divorced    Spouse name: Not on file   Number of children: 2   Years of education: 12   Highest education level: 12th grade  Occupational History   Occupation: retired-worked with nandicapped adults  Tobacco Use   Smoking status: Former    Years: 20.00    Types: Cigarettes    Quit date: 09/05/1979    Years since quitting: 42.3   Smokeless tobacco: Former  Scientific laboratory technician Use: Never used  Substance and Sexual Activity   Alcohol use: Yes    Alcohol/week: 0.0 - 1.0 standard drinks of alcohol    Comment: occ. wine   Drug use: No   Sexual activity: Not on file  Other Topics Concern   Not on file  Social History Narrative   Not on file   Social Determinants of Health   Financial Resource Strain: Low Risk  (12/10/2021)   Overall Financial Resource Strain (CARDIA)    Difficulty of Paying Living Expenses: Not hard at all  Food Insecurity: No Food Insecurity (12/10/2021)   Hunger Vital Sign    Worried About Running Out of Food in the Last Year: Never true    Ran Out of Food in the Last Year: Never true  Transportation Needs: No Transportation Needs (12/10/2021)   PRAPARE - Hydrologist (Medical): No    Lack of Transportation (Non-Medical): No  Physical  Activity: Insufficiently Active (12/10/2021)   Exercise Vital Sign    Days of Exercise per Week: 5 days    Minutes of Exercise per Session: 20 min  Stress: No Stress Concern Present (12/10/2021)   [place]    Feeling of Stress : Only a little  Social Connections: Moderately Isolated (12/10/2021)   Social Connection and Isolation Panel [NHANES]    Frequency of Communication with Friends and Family: Twice a week    Frequency of Social Gatherings with Friends and Family: Never    Attends Religious Services: More than 4 times per year    Active Member of [employer] or Organizations: Yes    Attends Archivist Meetings: 1 to 4 times per year    Marital Status: Divorced  Human resources officer Violence: Not At Risk (12/10/2021)   Humiliation, Afraid, Rape, and Kick questionnaire    Fear of Current or Ex-Partner: No    Emotionally Abused: No    Physically Abused: No    Sexually Abused: No    FAMILY HISTORY: Family History  Problem Relation Age of Onset   Parkinson's disease Mother    Heart disease Father    Lupus Sister    Aneurysm Son    Lupus Son    Breast cancer Maternal Aunt        mat great aunt  ALLERGIES:  is allergic to ferumoxytol, penicillins, iron, and other.  MEDICATIONS:  Current Outpatient Medications  Medication Sig Dispense Refill   ACCU-CHEK GUIDE test strip Use to check blood sugar twice daily 200 each 12   Accu-Chek Softclix Lancets lancets Use to check blood sugar twice daily 200 each 12   acetaminophen (TYLENOL) 500 MG tablet Take 1,000 mg by mouth every 6 (six) hours as needed for moderate pain.      Artificial Tear Solution (GENTEAL TEARS) 0.1-0.2-0.3 % SOLN Place 1 drop into both eyes 3 (three) times daily as needed (dry/irritated eyes.).     aspirin 81 MG chewable tablet Chew 1 tablet (81 mg total) by mouth daily.     B COMPLEX-C PO Take 10 mLs by mouth daily.     baclofen (LIORESAL) 10 MG  tablet TAKE 1 TO 2 TABLETS BY MOUTH TWICE DAILY AS NEEDED FOR MUSCLE SPASM 60 tablet 0   Blood Glucose Monitoring Suppl (ACCU-CHEK GUIDE) w/Device KIT Use to check blood sugar twice daily 1 kit 0   Blood Glucose Monitoring Suppl (GLUCOCOM BLOOD GLUCOSE MONITOR) DEVI Use to check blood sugar twice daily     Cholecalciferol (VITAMIN D3) 125 MCG (5000 UT) TABS Take 5,000 Units by mouth daily.     cyanocobalamin 1000 MCG tablet Take by mouth.     Ferrous Sulfate (IRON) 325 (65 Fe) MG TABS TAKE 1 TABLET BY MOUTH TWICE DAILY WITH A MEAL 180 each 1   furosemide (LASIX) 20 MG tablet Take 1 tablet by mouth once daily 90 tablet 1   hydrochlorothiazide (HYDRODIURIL) 25 MG tablet Take 1 tablet (25 mg total) by mouth daily. 90 tablet 2   hydroxychloroquine (PLAQUENIL) 200 MG tablet Take 1 tablet by mouth 2 (two) times daily.     lactobacillus acidophilus (BACID) TABS tablet Take 1 tablet by mouth daily as needed (digestive health.).     Lancets Misc. KIT Use to check blood sugar twice daily     levocetirizine (XYZAL) 5 MG tablet TAKE 1 TABLET BY MOUTH ONCE DAILY IN THE EVENING (Patient taking differently: Take 1/2 tablet (2.5 mg) daily in the evening as needed for allergies) 90 tablet 3   losartan (COZAAR) 50 MG tablet Take 1 tablet (50 mg total) by mouth daily. 90 tablet 2   magnesium oxide (MAG-OX) 400 MG tablet Take 400 mg by mouth daily.     mupirocin ointment (BACTROBAN) 2 % Apply 1 Application topically 2 (two) times daily. 22 g 3   simvastatin (ZOCOR) 20 MG tablet TAKE 1 TABLET BY MOUTH ONCE DAILY AT 6 PM 90 tablet 1   TRULICITY 7.20 NO/7.0JG SOPN Inject 0.75 mg into the skin once a week. 12 mL 1   No current facility-administered medications for this visit.    Patient is obese.  PHYSICAL EXAMINATION: ECOG PERFORMANCE STATUS: 1 - Symptomatic but completely ambulatory  Vitals:   01/11/22 1103  BP: (!) 127/55  Pulse: 78  Temp: (!) 96.4 F (35.8 C)  SpO2: 100%   Filed Weights   01/11/22 1103   Weight: 238 lb 1.6 oz (108 kg)    Physical Exam HENT:     Head: Normocephalic and atraumatic.     Mouth/Throat:     Pharynx: No oropharyngeal exudate.  Eyes:     Pupils: Pupils are equal, round, and reactive to light.  Cardiovascular:     Rate and Rhythm: Normal rate and regular rhythm.  Pulmonary:     Effort: Pulmonary effort is normal. No  respiratory distress.     Breath sounds: Normal breath sounds. No wheezing.  Abdominal:     General: Bowel sounds are normal. There is no distension.     Palpations: Abdomen is soft. There is no mass.     Tenderness: There is no abdominal tenderness. There is no guarding or rebound.  Musculoskeletal:        General: No tenderness. Normal range of motion.     Cervical back: Normal range of motion and neck supple.  Skin:    General: Skin is warm.  Neurological:     Mental Status: She is alert and oriented to person, place, and time.  Psychiatric:        Mood and Affect: Affect normal.     Latest Reference Range & Units 01/11/21 10:08  Iron 28 - 170 ug/dL 100  UIBC ug/dL 212  TIBC 250 - 450 ug/dL 312  Saturation Ratios 10.4 - 31.8 % 32 (H)  Ferritin 11 - 307 ng/mL 196  (H): Data is abnormally high  LABORATORY DATA:  I have reviewed the data as listed Lab Results  Component Value Date   WBC 3.3 (L) 01/11/2022   HGB 10.8 (L) 01/11/2022   HCT 35.0 (L) 01/11/2022   MCV 94.9 01/11/2022   PLT 163 01/11/2022   Recent Labs    04/20/21 0821 07/12/21 0940 11/15/21 1021 01/11/22 1032  NA 142 139 139 141  K 4.0 4.1 4.0 4.0  CL 106 102 106 108  CO2 _0 GLUCOSE 213* 131* 157* 198*  BUN 28* 34* 30* 38*  CREATININE 1.18* 1.43* 1.22* 1.27*  CALCIUM 9.1 9.4 9.2 9.2  GFRNONAA  --  38* 46* 44*  PROT 7.0  --  7.9  --   ALBUMIN  --   --  3.4*  --   AST 26  --  32  --   ALT 17  --  20  --   ALKPHOS  --   --  62  --   BILITOT 0.3  --  0.5  --     ASSESSMENT & PLAN:  Normocytic anemia #Normocytic anemia unclear etiology;  previous history of AVM iron deficiency; September 2020-iron studies negative for deficiency.  Bone marrow biopsy negative for any significant dyspoiesis.  Cytogenetics normal.  # Today hemoglobin is 10-11-/? CKD vs others  Slightly low; otherwise fairly asymptomatic.Patient is on p.o. iron.  March 2023 iron saturation 18% ferritin 83. STABLE.                             #White count 3.4 chronic benign ethnic neutropenia-neutrophil count 1.6- STABLE.   # Relative B12 deficiency-200; March 2021 -normal continue sublingual B12 tablets 2-3 times a week. Await B12 level from today.   #Chronic kidney disease-stage III mild [[facility]]; reviewed the note; again reviewed the potential causes of CKD include-diabetes hypertension age obesity etc.  # DISPOSITION: # in 6 months-MD; labs- cbc/bmp/ iron studies/ferritin/ b12---Dr.B       Cammie Sickle, MD 01/11/2022 12:22 PM

## 2022-01-11 NOTE — Progress Notes (Signed)
No concerns. 

## 2022-01-11 NOTE — Assessment & Plan Note (Addendum)
#  Normocytic anemia unclear etiology; previous history of AVM iron deficiency; September 2020-iron studies negative for deficiency.  Bone marrow biopsy negative for any significant dyspoiesis.  Cytogenetics normal.  # Today hemoglobin is 10-11-/? CKD vs others  Slightly low; otherwise fairly asymptomatic.Patient is on p.o. iron.  March 2023 iron saturation 18% ferritin 83.  Iron studies pending today.  STABLE.                            # White count 3.4 chronic benign ethnic neutropenia-neutrophil count 1.6- STABLE.   # Relative B12 deficiency-200; March 2023- 5000+- continue sublingual B12 tablets 2-3 times a week. Await B12 level from today.   #Chronic kidney disease-stage III mild [[facility]]-overall stable.  # DISPOSITION: # in 6 months-MD; labs- cbc/bmp/ iron studies/ferritin/ b12---Dr.B

## 2022-01-16 DIAGNOSIS — I129 Hypertensive chronic kidney disease with stage 1 through stage 4 chronic kidney disease, or unspecified chronic kidney disease: Secondary | ICD-10-CM | POA: Diagnosis not present

## 2022-01-16 DIAGNOSIS — E890 Postprocedural hypothyroidism: Secondary | ICD-10-CM | POA: Diagnosis not present

## 2022-01-16 DIAGNOSIS — Z9181 History of falling: Secondary | ICD-10-CM | POA: Diagnosis not present

## 2022-01-16 DIAGNOSIS — Z7982 Long term (current) use of aspirin: Secondary | ICD-10-CM | POA: Diagnosis not present

## 2022-01-16 DIAGNOSIS — E785 Hyperlipidemia, unspecified: Secondary | ICD-10-CM | POA: Diagnosis not present

## 2022-01-16 DIAGNOSIS — E1122 Type 2 diabetes mellitus with diabetic chronic kidney disease: Secondary | ICD-10-CM | POA: Diagnosis not present

## 2022-01-16 DIAGNOSIS — Z952 Presence of prosthetic heart valve: Secondary | ICD-10-CM | POA: Diagnosis not present

## 2022-01-16 DIAGNOSIS — G8929 Other chronic pain: Secondary | ICD-10-CM | POA: Diagnosis not present

## 2022-01-16 DIAGNOSIS — N1832 Chronic kidney disease, stage 3b: Secondary | ICD-10-CM | POA: Diagnosis not present

## 2022-01-16 DIAGNOSIS — Z7985 Long-term (current) use of injectable non-insulin antidiabetic drugs: Secondary | ICD-10-CM | POA: Diagnosis not present

## 2022-01-16 DIAGNOSIS — M1990 Unspecified osteoarthritis, unspecified site: Secondary | ICD-10-CM | POA: Diagnosis not present

## 2022-01-16 DIAGNOSIS — M4722 Other spondylosis with radiculopathy, cervical region: Secondary | ICD-10-CM | POA: Diagnosis not present

## 2022-01-16 DIAGNOSIS — D631 Anemia in chronic kidney disease: Secondary | ICD-10-CM | POA: Diagnosis not present

## 2022-01-17 DIAGNOSIS — E1122 Type 2 diabetes mellitus with diabetic chronic kidney disease: Secondary | ICD-10-CM | POA: Diagnosis not present

## 2022-01-17 DIAGNOSIS — G8929 Other chronic pain: Secondary | ICD-10-CM | POA: Diagnosis not present

## 2022-01-17 DIAGNOSIS — M4722 Other spondylosis with radiculopathy, cervical region: Secondary | ICD-10-CM | POA: Diagnosis not present

## 2022-01-17 DIAGNOSIS — Z9181 History of falling: Secondary | ICD-10-CM | POA: Diagnosis not present

## 2022-01-17 DIAGNOSIS — Z7985 Long-term (current) use of injectable non-insulin antidiabetic drugs: Secondary | ICD-10-CM | POA: Diagnosis not present

## 2022-01-17 DIAGNOSIS — E890 Postprocedural hypothyroidism: Secondary | ICD-10-CM | POA: Diagnosis not present

## 2022-01-17 DIAGNOSIS — D631 Anemia in chronic kidney disease: Secondary | ICD-10-CM | POA: Diagnosis not present

## 2022-01-17 DIAGNOSIS — E785 Hyperlipidemia, unspecified: Secondary | ICD-10-CM | POA: Diagnosis not present

## 2022-01-17 DIAGNOSIS — I129 Hypertensive chronic kidney disease with stage 1 through stage 4 chronic kidney disease, or unspecified chronic kidney disease: Secondary | ICD-10-CM | POA: Diagnosis not present

## 2022-01-17 DIAGNOSIS — Z952 Presence of prosthetic heart valve: Secondary | ICD-10-CM | POA: Diagnosis not present

## 2022-01-17 DIAGNOSIS — M1990 Unspecified osteoarthritis, unspecified site: Secondary | ICD-10-CM | POA: Diagnosis not present

## 2022-01-17 DIAGNOSIS — N1832 Chronic kidney disease, stage 3b: Secondary | ICD-10-CM | POA: Diagnosis not present

## 2022-01-17 DIAGNOSIS — Z7982 Long term (current) use of aspirin: Secondary | ICD-10-CM | POA: Diagnosis not present

## 2022-01-21 ENCOUNTER — Ambulatory Visit: Payer: Self-pay

## 2022-01-21 NOTE — Patient Instructions (Signed)
Visit Information  Thank you for taking time to visit with me today. Please don't hesitate to contact me if I can be of assistance to you.   Following are the goals we discussed today:   Goals Addressed             This Visit's Progress    RNCM: Effective Management of Chronic Pain       Care Coordination Interventions: Patient rates her neck and arm pain at a 7 today on a scale of 0-10 Reviewed provider established plan for pain management. The patient continues to work with PT on exercises and she says this has helped her a lot. She is working with the exercises to help her neck and other things. Education and support given.  Discussed importance of adherence to all scheduled medical appointments. Sees specialist and pcp on a regular basis.  Counseled on the importance of reporting any/all new or changed pain symptoms or management strategies to pain management provider. Education and support given Advised patient to report to care team affect of pain on daily activities. Patient is working with PT.  Discussed use of relaxation techniques and/or diversional activities to assist with pain reduction (distraction, imagery, relaxation, massage, acupressure, TENS, heat, and cold application. Patient is compliant with recommendations from her providers. Reviewed with patient prescribed pharmacological and nonpharmacological pain relief strategies. The patient is compliant with medications Advised patient to discuss changes in level and intensity of pain and unresolved pain  with provider Screening for signs and symptoms of depression related to chronic disease state  Assessed social determinant of health barriers.           Our next appointment is by telephone on 03-11-2022 at 1 pm  Please call the care guide team at [phone] if you need to cancel or reschedule your appointment.   If you are experiencing a Mental Health or [place] or need someone to talk to, please  call the Suicide and Crisis Lifeline: 988 call the [place] National Suicide Prevention Lifeline: 458-545-3623 or TTY: 226-648-3831 TTY (563)657-3135) to talk to a trained counselor call 1-800-273-TALK (toll free, 24 hour hotline)  Patient verbalizes understanding of instructions and care plan provided today and agrees to view in [place]. Active MyChart status and patient understanding of how to access instructions and care plan via MyChart confirmed with patient.     Telephone follow up appointment with care management team member scheduled for: 03-11-2022 at 1 pm  [place], MSN, [place]  Mobile: [phone]

## 2022-01-21 NOTE — Patient Outreach (Signed)
  Care Coordination   Follow Up Visit Note   01/21/2022 Name: Diana Harmon MRN: [id number] DOB: [birth year]  Diana Harmon is a 76 y.o. year old female who sees Diana Hauser, DO for primary care. I spoke with  Diana Harmon by phone today.  What matters to the patients health and wellness today?  Management of pain    Goals Addressed             This Visit's Progress    RNCM: Effective Management of Chronic Pain       Care Coordination Interventions: Patient rates her neck and arm pain at a 7 today on a scale of 0-10 Reviewed provider established plan for pain management. The patient continues to work with PT on exercises and she says this has helped her a lot. She is working with the exercises to help her neck and other things. Education and support given.  Discussed importance of adherence to all scheduled medical appointments. Sees specialist and pcp on a regular basis.  Counseled on the importance of reporting any/all new or changed pain symptoms or management strategies to pain management provider. Education and support given Advised patient to report to care team affect of pain on daily activities. Patient is working with PT.  Discussed use of relaxation techniques and/or diversional activities to assist with pain reduction (distraction, imagery, relaxation, massage, acupressure, TENS, heat, and cold application. Patient is compliant with recommendations from her providers. Reviewed with patient prescribed pharmacological and nonpharmacological pain relief strategies. The patient is compliant with medications Advised patient to discuss changes in level and intensity of pain and unresolved pain  with provider Screening for signs and symptoms of depression related to chronic disease state  Assessed social determinant of health barriers.           SDOH assessments and interventions completed:  Yes  SDOH Interventions Today     Flowsheet Row Most Recent Value  SDOH Interventions   Food Insecurity Interventions Intervention Not Indicated  Housing Interventions Intervention Not Indicated  Transportation Interventions Intervention Not Indicated  Utilities Interventions Intervention Not Indicated        Care Coordination Interventions Activated:  Yes  Care Coordination Interventions:  Yes, provided   Follow up plan: Follow up call scheduled for 03-11-2022 at 1 pm    Encounter Outcome:  Pt. Visit Completed   Diana Larsson RN, MSN, [place]  Mobile: [phone]

## 2022-01-23 DIAGNOSIS — E785 Hyperlipidemia, unspecified: Secondary | ICD-10-CM | POA: Diagnosis not present

## 2022-01-23 DIAGNOSIS — Z9181 History of falling: Secondary | ICD-10-CM | POA: Diagnosis not present

## 2022-01-23 DIAGNOSIS — E1122 Type 2 diabetes mellitus with diabetic chronic kidney disease: Secondary | ICD-10-CM | POA: Diagnosis not present

## 2022-01-23 DIAGNOSIS — Z952 Presence of prosthetic heart valve: Secondary | ICD-10-CM | POA: Diagnosis not present

## 2022-01-23 DIAGNOSIS — M1990 Unspecified osteoarthritis, unspecified site: Secondary | ICD-10-CM | POA: Diagnosis not present

## 2022-01-23 DIAGNOSIS — Z7985 Long-term (current) use of injectable non-insulin antidiabetic drugs: Secondary | ICD-10-CM | POA: Diagnosis not present

## 2022-01-23 DIAGNOSIS — I129 Hypertensive chronic kidney disease with stage 1 through stage 4 chronic kidney disease, or unspecified chronic kidney disease: Secondary | ICD-10-CM | POA: Diagnosis not present

## 2022-01-23 DIAGNOSIS — Z7982 Long term (current) use of aspirin: Secondary | ICD-10-CM | POA: Diagnosis not present

## 2022-01-23 DIAGNOSIS — M4722 Other spondylosis with radiculopathy, cervical region: Secondary | ICD-10-CM | POA: Diagnosis not present

## 2022-01-23 DIAGNOSIS — D631 Anemia in chronic kidney disease: Secondary | ICD-10-CM | POA: Diagnosis not present

## 2022-01-23 DIAGNOSIS — N1832 Chronic kidney disease, stage 3b: Secondary | ICD-10-CM | POA: Diagnosis not present

## 2022-01-23 DIAGNOSIS — E890 Postprocedural hypothyroidism: Secondary | ICD-10-CM | POA: Diagnosis not present

## 2022-01-23 DIAGNOSIS — G8929 Other chronic pain: Secondary | ICD-10-CM | POA: Diagnosis not present

## 2022-01-30 DIAGNOSIS — M4722 Other spondylosis with radiculopathy, cervical region: Secondary | ICD-10-CM | POA: Diagnosis not present

## 2022-01-30 DIAGNOSIS — Z952 Presence of prosthetic heart valve: Secondary | ICD-10-CM | POA: Diagnosis not present

## 2022-01-30 DIAGNOSIS — Z7982 Long term (current) use of aspirin: Secondary | ICD-10-CM | POA: Diagnosis not present

## 2022-01-30 DIAGNOSIS — Z9181 History of falling: Secondary | ICD-10-CM | POA: Diagnosis not present

## 2022-01-30 DIAGNOSIS — Z7985 Long-term (current) use of injectable non-insulin antidiabetic drugs: Secondary | ICD-10-CM | POA: Diagnosis not present

## 2022-01-30 DIAGNOSIS — M1990 Unspecified osteoarthritis, unspecified site: Secondary | ICD-10-CM | POA: Diagnosis not present

## 2022-01-30 DIAGNOSIS — E785 Hyperlipidemia, unspecified: Secondary | ICD-10-CM | POA: Diagnosis not present

## 2022-01-30 DIAGNOSIS — I129 Hypertensive chronic kidney disease with stage 1 through stage 4 chronic kidney disease, or unspecified chronic kidney disease: Secondary | ICD-10-CM | POA: Diagnosis not present

## 2022-01-30 DIAGNOSIS — E1122 Type 2 diabetes mellitus with diabetic chronic kidney disease: Secondary | ICD-10-CM | POA: Diagnosis not present

## 2022-01-30 DIAGNOSIS — D631 Anemia in chronic kidney disease: Secondary | ICD-10-CM | POA: Diagnosis not present

## 2022-01-30 DIAGNOSIS — E890 Postprocedural hypothyroidism: Secondary | ICD-10-CM | POA: Diagnosis not present

## 2022-01-30 DIAGNOSIS — N1832 Chronic kidney disease, stage 3b: Secondary | ICD-10-CM | POA: Diagnosis not present

## 2022-01-30 DIAGNOSIS — G8929 Other chronic pain: Secondary | ICD-10-CM | POA: Diagnosis not present

## 2022-01-31 DIAGNOSIS — E785 Hyperlipidemia, unspecified: Secondary | ICD-10-CM | POA: Diagnosis not present

## 2022-01-31 DIAGNOSIS — M1990 Unspecified osteoarthritis, unspecified site: Secondary | ICD-10-CM | POA: Diagnosis not present

## 2022-01-31 DIAGNOSIS — M4722 Other spondylosis with radiculopathy, cervical region: Secondary | ICD-10-CM | POA: Diagnosis not present

## 2022-01-31 DIAGNOSIS — Z7982 Long term (current) use of aspirin: Secondary | ICD-10-CM | POA: Diagnosis not present

## 2022-01-31 DIAGNOSIS — G8929 Other chronic pain: Secondary | ICD-10-CM | POA: Diagnosis not present

## 2022-01-31 DIAGNOSIS — N1832 Chronic kidney disease, stage 3b: Secondary | ICD-10-CM | POA: Diagnosis not present

## 2022-01-31 DIAGNOSIS — I129 Hypertensive chronic kidney disease with stage 1 through stage 4 chronic kidney disease, or unspecified chronic kidney disease: Secondary | ICD-10-CM | POA: Diagnosis not present

## 2022-01-31 DIAGNOSIS — Z7985 Long-term (current) use of injectable non-insulin antidiabetic drugs: Secondary | ICD-10-CM | POA: Diagnosis not present

## 2022-01-31 DIAGNOSIS — Z9181 History of falling: Secondary | ICD-10-CM | POA: Diagnosis not present

## 2022-01-31 DIAGNOSIS — E890 Postprocedural hypothyroidism: Secondary | ICD-10-CM | POA: Diagnosis not present

## 2022-01-31 DIAGNOSIS — Z952 Presence of prosthetic heart valve: Secondary | ICD-10-CM | POA: Diagnosis not present

## 2022-01-31 DIAGNOSIS — D631 Anemia in chronic kidney disease: Secondary | ICD-10-CM | POA: Diagnosis not present

## 2022-01-31 DIAGNOSIS — E1122 Type 2 diabetes mellitus with diabetic chronic kidney disease: Secondary | ICD-10-CM | POA: Diagnosis not present

## 2022-02-04 ENCOUNTER — Other Ambulatory Visit: Payer: Self-pay | Admitting: Family Medicine

## 2022-02-04 ENCOUNTER — Telehealth: Payer: Self-pay | Admitting: Family Medicine

## 2022-02-04 DIAGNOSIS — E1169 Type 2 diabetes mellitus with other specified complication: Secondary | ICD-10-CM

## 2022-02-04 NOTE — Telephone Encounter (Signed)
Medication Refill - Medication:  accu check nano smart view w/ device kit  Has the patient contacted their pharmacy? No.  Preferred Pharmacy (with phone number or street name):  [facility], [place] Phone:  [phone]  Fax:  [phone]     Has the patient been seen for an appointment in the last year OR does the patient have an upcoming appointment? Yes.    Pt requesting a new glucose meter due to current one inaccurately reading  Pt does not want same meter

## 2022-02-05 MED ORDER — ACCU-CHEK GUIDE W/DEVICE KIT: PACK | 0 refills | Status: AC

## 2022-02-05 NOTE — Addendum Note (Signed)
Addended by: [name] on: 02/05/2022 12:34 PM   Modules accepted: Orders

## 2022-02-05 NOTE — Telephone Encounter (Signed)
Requested Prescriptions  Pending Prescriptions Disp Refills  . ACCU-CHEK GUIDE test strip [Pharmacy Med Name: Accu-Chek Guide In Vitro Strip] 200 each 1    Sig: USE TO [place] DAILY     Endocrinology: Diabetes - Testing Supplies Passed - 02/04/2022  4:09 PM      Passed - Valid encounter within last 12 months    Recent Outpatient Visits          3 months ago Type 2 diabetes mellitus with other specified complication, without long-term current use of insulin ([place])   [facility], Devonne Doughty, DO   9 months ago Annual physical exam   [facility], Devonne Doughty, DO   1 year ago Type 2 diabetes mellitus with other specified complication, without long-term current use of insulin [facility])   [facility], Devonne Doughty, DO   1 year ago Type 2 diabetes mellitus with other specified complication, without long-term current use of insulin [facility])   [facility], Devonne Doughty, DO   1 year ago Type 2 diabetes mellitus with other specified complication, without long-term current use of insulin ([place])   [facility], Devonne Doughty, DO      Future Appointments            In 2 months [facility], Devonne Doughty, DO [facility], [place]           . Accu-Chek Softclix Lancets lancets [Pharmacy Med Name: [place] 200 each 1    Sig: USE TO [place] DAILY     Endocrinology: Diabetes - Testing Supplies Passed - 02/04/2022  4:09 PM      Passed - Valid encounter within last 12 months    Recent Outpatient Visits          3 months ago Type 2 diabetes mellitus with other specified complication, without long-term current use of insulin ([place])   [facility], Devonne Doughty, DO   9 months ago Annual physical exam   [facility] [name], DO   1 year ago Type 2 diabetes mellitus with  other specified complication, without long-term current use of insulin [facility])   [facility], Devonne Doughty, DO   1 year ago Type 2 diabetes mellitus with other specified complication, without long-term current use of insulin [facility])   [facility], Devonne Doughty, DO   1 year ago Type 2 diabetes mellitus with other specified complication, without long-term current use of insulin ([place])   [facility] Parks Ranger, Devonne Doughty, DO      Future Appointments            In 2 months Parks [facility], Devonne Doughty, DO [facility], [facility]

## 2022-02-06 DIAGNOSIS — Z952 Presence of prosthetic heart valve: Secondary | ICD-10-CM | POA: Diagnosis not present

## 2022-02-06 DIAGNOSIS — E785 Hyperlipidemia, unspecified: Secondary | ICD-10-CM | POA: Diagnosis not present

## 2022-02-06 DIAGNOSIS — N1832 Chronic kidney disease, stage 3b: Secondary | ICD-10-CM | POA: Diagnosis not present

## 2022-02-06 DIAGNOSIS — G8929 Other chronic pain: Secondary | ICD-10-CM | POA: Diagnosis not present

## 2022-02-06 DIAGNOSIS — M4722 Other spondylosis with radiculopathy, cervical region: Secondary | ICD-10-CM | POA: Diagnosis not present

## 2022-02-06 DIAGNOSIS — D631 Anemia in chronic kidney disease: Secondary | ICD-10-CM | POA: Diagnosis not present

## 2022-02-06 DIAGNOSIS — Z9181 History of falling: Secondary | ICD-10-CM | POA: Diagnosis not present

## 2022-02-06 DIAGNOSIS — I129 Hypertensive chronic kidney disease with stage 1 through stage 4 chronic kidney disease, or unspecified chronic kidney disease: Secondary | ICD-10-CM | POA: Diagnosis not present

## 2022-02-06 DIAGNOSIS — Z7982 Long term (current) use of aspirin: Secondary | ICD-10-CM | POA: Diagnosis not present

## 2022-02-06 DIAGNOSIS — E1122 Type 2 diabetes mellitus with diabetic chronic kidney disease: Secondary | ICD-10-CM | POA: Diagnosis not present

## 2022-02-06 DIAGNOSIS — M1990 Unspecified osteoarthritis, unspecified site: Secondary | ICD-10-CM | POA: Diagnosis not present

## 2022-02-06 DIAGNOSIS — Z7985 Long-term (current) use of injectable non-insulin antidiabetic drugs: Secondary | ICD-10-CM | POA: Diagnosis not present

## 2022-02-06 DIAGNOSIS — E890 Postprocedural hypothyroidism: Secondary | ICD-10-CM | POA: Diagnosis not present

## 2022-02-13 ENCOUNTER — Telehealth: Payer: Medicare Other

## 2022-02-13 ENCOUNTER — Telehealth: Payer: Self-pay | Admitting: Pharmacist

## 2022-02-13 NOTE — Telephone Encounter (Signed)
   Outreach Note  02/13/2022 Name: Diana Harmon MRN: [id number] DOB: Jan 24, 1946  Referred by: [name], DO Reason for referral : No chief complaint on file.   Was unable to reach patient via telephone and unable to leave a message as no voicemail picked up.  Later receive message that patient's daughter contacted [facility] and requested outreach to reschedule appointment with clinic pharmacist   Follow Up Plan: Will collaborate with Care Guide to outreach to reschedule follow up with me  Wallace Cullens, PharmD, [place] [facility] [phone]

## 2022-02-14 ENCOUNTER — Other Ambulatory Visit: Payer: Self-pay | Admitting: Family Medicine

## 2022-02-14 DIAGNOSIS — E1169 Type 2 diabetes mellitus with other specified complication: Secondary | ICD-10-CM

## 2022-02-14 DIAGNOSIS — I10 Essential (primary) hypertension: Secondary | ICD-10-CM

## 2022-02-14 DIAGNOSIS — I5032 Chronic diastolic (congestive) heart failure: Secondary | ICD-10-CM

## 2022-02-22 ENCOUNTER — Ambulatory Visit (INDEPENDENT_AMBULATORY_CARE_PROVIDER_SITE_OTHER): Payer: Medicare Other | Admitting: Pharmacist

## 2022-02-22 DIAGNOSIS — E1169 Type 2 diabetes mellitus with other specified complication: Secondary | ICD-10-CM

## 2022-02-22 DIAGNOSIS — I5032 Chronic diastolic (congestive) heart failure: Secondary | ICD-10-CM

## 2022-02-22 DIAGNOSIS — I10 Essential (primary) hypertension: Secondary | ICD-10-CM

## 2022-02-22 NOTE — Chronic Care Management (AMB) (Signed)
Chronic Care Management CCM Pharmacy Note  02/22/2022 Name:  Diana Harmon MRN:  [id number] DOB:  October 19, 1945   Subjective: Diana Harmon is an 76 y.o. year old female who is a primary patient of [name], DO.  The CCM team was consulted for assistance with disease management and care coordination needs.    Engaged with patient by telephone for follow up visit for pharmacy case management and/or care coordination services.   Objective:  Medications Reviewed Today     Reviewed by Rennis Petty, RPH-CPP (Pharmacist) on 02/22/22 at 1012  Med List Status: <None>   Medication Order Taking? Sig Documenting Provider Last Dose Status Informant  ACCU-CHEK GUIDE test strip [id number]  USE TO CHECK BLOOD SUGAR TWICE DAILY [name], DO  Active   Accu-Chek Softclix Lancets lancets [id number]  USE TO CHECK GLUCOSE TWICE DAILY [place], Devonne Doughty, DO  Active   acetaminophen (TYLENOL) 500 MG tablet [id number] Yes Take 1,000 mg by mouth every 6 (six) hours as needed for moderate pain.  [provider] Taking Active Child  Artificial Tear Solution (GENTEAL TEARS) 0.1-0.2-0.3 % SOLN [id number] Yes Place 1 drop into both eyes 3 (three) times daily as needed (dry/irritated eyes.). [provider] Taking Active Child  aspirin 81 MG chewable tablet [id number] Yes Chew 1 tablet (81 mg total) by mouth daily. Eileen Stanford, PA-C Taking Active Child  B COMPLEX-C PO [id number] Yes Take 10 mLs by mouth daily. [provider] Taking Active Child  baclofen (LIORESAL) 10 MG tablet [id number]  TAKE 1 TO 2 TABLETS BY MOUTH TWICE DAILY AS NEEDED FOR MUSCLE SPASM Parks [place], Devonne Doughty, DO  Active   Blood Glucose Monitoring Suppl (ACCU-CHEK GUIDE) w/Device KIT [id number]  Use to check blood sugar twice per day [name], DO  Active   Cholecalciferol (VITAMIN D3) 125 MCG (5000 UT) TABS [id number] Yes Take 5,000 Units by  mouth daily. [provider] Taking Active Child    Discontinued 09/05/19 1316   cyanocobalamin 1000 MCG tablet [id number] Yes Take by mouth every other day. [provider] Taking Active   Ferrous Sulfate (IRON) 325 (65 Fe) MG TABS [id number] Yes TAKE 1 TABLET BY MOUTH TWICE DAILY WITH A MEAL [name], Devonne Doughty, DO Taking Active Child  furosemide (LASIX) 20 MG tablet [id number] Yes Take 1 tablet by mouth once daily  Patient taking differently: Take 20 mg by mouth daily as needed.   End, Harrell Gave, MD Taking Active   hydrochlorothiazide (HYDRODIURIL) 25 MG tablet [id number] Yes Take 1 tablet (25 mg total) by mouth daily. Furth, Cadence H, PA-C Taking Active   hydroxychloroquine (PLAQUENIL) 200 MG tablet [id number] Yes Take 1 tablet by mouth 2 (two) times daily. [provider] Taking Active   lactobacillus acidophilus (BACID) TABS tablet [id number] Yes Take 1 tablet by mouth daily as needed (digestive health.). [provider] Taking Active            Med Note Nat Christen   Wed Nov 03, 2019  3:06 PM)    Lancets Misc. KIT [id number]  Use to check blood sugar twice daily [provider]  Active   levocetirizine (XYZAL) 5 MG tablet [id number] Yes TAKE 1 TABLET BY MOUTH ONCE DAILY IN THE EVENING  Patient taking differently: Take 1/2 tablet (2.5 mg) daily in the evening as needed for allergies   [name], DO Taking Active   losartan (COZAAR) 50 MG tablet [id number] Yes Take 1 tablet (50  mg total) by mouth daily. Furth, Cadence H, PA-C Taking Active   magnesium oxide (MAG-OX) 400 MG tablet [id number] Yes Take 400 mg by mouth daily. [provider] Taking Active   mupirocin ointment (BACTROBAN) 2 % [id number] Yes Apply 1 Application topically 2 (two) times daily.  Patient taking differently: Apply 1 Application topically 2 (two) times daily as needed.   [name], DO Taking Active   simvastatin (ZOCOR) 20 MG tablet  [id number] Yes TAKE 1 TABLET BY MOUTH ONCE DAILY AT 6 PM [name], Devonne Doughty, DO Taking Active   TRULICITY 2.70 WC/3.7SE SOPN [id number] Yes Inject 0.75 mg into the skin once a week. [name], DO Taking Active             Pertinent Labs:  Lab Results  Component Value Date   HGBA1C 6.1 (A) 10/29/2021   Lab Results  Component Value Date   CHOL 155 04/20/2021   HDL 65 04/20/2021   LDLCALC 72 04/20/2021   TRIG 95 04/20/2021   CHOLHDL 2.4 04/20/2021   Lab Results  Component Value Date   CREATININE 1.27 (H) 01/11/2022   BUN 38 (H) 01/11/2022   NA 141 01/11/2022   K 4.0 01/11/2022   CL 108 01/11/2022   CO2 27 01/11/2022    SDOH:  (Social Determinants of Health) assessments and interventions performed:  SDOH Interventions    [place] Coordination from 01/21/2022 in [place] Coordination from 12/10/2021 in [place] from 05/18/2021 in [place] [facility] from 02/22/2021 in [place] [facility] from 10/30/2020 in [place] [facility] from 09/13/2019 in [place]  SDOH Interventions        Food Insecurity Interventions Intervention Not Indicated Intervention Not Indicated Intervention Not Indicated Intervention Not Indicated -- --  Housing Interventions Intervention Not Indicated Intervention Not Indicated Intervention Not Indicated Intervention Not Indicated -- --  Transportation Interventions Intervention Not Indicated Intervention Not Indicated Intervention Not Indicated Intervention Not Indicated -- --  Utilities Interventions Intervention Not Indicated -- -- -- -- --  Financial Strain Interventions -- Intervention Not Indicated Intervention Not Indicated Intervention Not Indicated -- --  Physical Activity Interventions -- Other (Comments)   [patient is working with specialist due to issues with muscles in neck and arm. She is limited in what she can do currently with being active] Intervention Not Indicated Other (Comments)  [The patient is doing exercises the PT has showed her] Other (Comments)  [Encourage physical activity] Other (Comments)  [does not do any structured activity]  Stress Interventions -- Intervention Not Indicated  [concerned over what is going on with her neck and arm muscles] Intervention Not Indicated Intervention Not Indicated -- --  Social Connections Interventions -- Intervention Not Indicated Intervention Not Indicated Other (Comment)  [good support system] -- --       CCM Care Plan  Review of patient past medical history, allergies, medications, health status, including review of consultants reports, laboratory and other test data, was performed as part of comprehensive evaluation and provision of chronic care management services.   Care Plan : PharmD - Medication Assistance/Management  Updates made by Rennis Petty, RPH-CPP since 02/22/2022 12:00 AM     Problem: Disease Progression      Long-Range Goal: Disease Progression Prevented or Minimized   Start Date: 10/30/2020  Expected End Date: 01/28/2021  Recent Progress:  On track  Priority: High  Note:   Current Barriers:  Unable to independently afford treatment regimen Patient APPROVED for [facility] Patient Assistance Program for Trulicity, eligible from 11/21/20-05/05/22  Pharmacist Clinical Goal(s):  Over the next 90 days, patient will verbalize ability to afford treatment regimen through collaboration with PharmD and provider.   Interventions: 1:1 collaboration with [name], DO regarding development and update of comprehensive plan of care as evidenced by provider attestation and co-signature Inter-disciplinary care team collaboration (see longitudinal plan of care) Today reports just completed working with PT and OT last  week and neck and knee pain has improved. Reports continuing exercises that they provided Noticed not needing baclofen recently, but has to use if needed Comprehensive medication review performed; medication list updated in electronic medical record From review of chart, note patient due for follow up appointment with Rheumatology. Clinic phone number provided  Patient states that she will call to schedule appointment.  T2DM: Control improved based on latest A1C reading; current treatment: Trulicity 2.22 mg weekly Previous therapies: metformin Reports recent blood sugar readings: Last checked 10/14: 96 before breakfast; after lunch 118 Denies symptoms of hypoglycemia Cholesterol management: simvastatin 20 mg daily Exercise Reports exercise limited by arthritis Continuing exercises from recent PT/OT Encourage patient to continue to monitor home blood sugar, keep log and bring record with her to medical appointments  Medication Assistance: Reports received latest shipment of Trulicity from assistance program ~2 weeks ago Patient requesting assistance with re-enrollment in [place] Patient Assistance Program for Trulicity for 9798 calendar year Will collaborate with Templeton Simcox to request her assistance to patient with completing this application  HTN/Chronic HFpEF: Current treatment: HCTZ 25 mg daily Losartan 50 mg daily Furosemide 20 mg daily as needed for edema Reports rarely needing furosemide recently with elevating legs and using compression socks during the day Note patient has home upper arm blood pressure monitor Reports recent home blood pressure readings: Today: 127/55, HR 76 Yesterday: 126/53, HR 77 Denies symptoms of hypotension Counsel on impact of salt/sodium in diet on blood pressure Encouraged patient to bring record of daily weights and home BP readings with her to medical appointments, but to call provider sooner if readings outside of established  parameters    Patient Goals/Self-Care Activities Over the next 90 days, patient will:  - take medications as prescribed - check glucose, document, and provide at future appointments - check blood pressure, document, and provide at future appointments - collaborate with provider on medication access solutions - attend medical appointments as scheduled  Follow Up Plan: Telephone follow up appointment with care management team member scheduled for: 04/10/2022 at [place], PharmD, Para March, [place] [phone]

## 2022-02-22 NOTE — Patient Instructions (Signed)
Goals Addressed             This Visit's Progress    Pharmacy Goals       Our goal A1c is less than 7%. This corresponds with fasting sugars less than 130 and 2 hour after meal sugars less than 180. Please keep a log of your results when checking your blood sugar  Our goal bad cholesterol, or LDL, is less than 70 . This is why it is important to continue taking your simvastatin  Please check your home blood pressure, keep a log of the results and bring this with you to your medical appointments.  Feel free to call me with any questions or concerns. I look forward to our next call!   Wallace Cullens, PharmD, Para March, CPP Clinical Pharmacist [facility] [phone]

## 2022-02-27 ENCOUNTER — Telehealth: Payer: Self-pay | Admitting: Pharmacy Technician

## 2022-02-27 DIAGNOSIS — Z596 Low income: Secondary | ICD-10-CM

## 2022-02-27 NOTE — Progress Notes (Signed)
[place] [facility])                                            Gas Team    02/27/2022  Shannan Slinker Straw 1945/08/11 [id number]                                      Medication Assistance Referral-FOR 2023 RE ENROLLMENT  Referral From: [facility] Embedded RPh Dorthula Perfect   Medication/Company: Danelle Berry / [name] Patient application portion:  Mailed Provider application portion: Faxed  to Dr. Parks Ranger Provider address/fax verified via: Office website  Gelsey Amyx P. Kamelia Lampkins, [place]  [phone]

## 2022-03-05 DIAGNOSIS — I110 Hypertensive heart disease with heart failure: Secondary | ICD-10-CM | POA: Diagnosis not present

## 2022-03-05 DIAGNOSIS — Z7985 Long-term (current) use of injectable non-insulin antidiabetic drugs: Secondary | ICD-10-CM | POA: Diagnosis not present

## 2022-03-05 DIAGNOSIS — E1159 Type 2 diabetes mellitus with other circulatory complications: Secondary | ICD-10-CM | POA: Diagnosis not present

## 2022-03-05 DIAGNOSIS — I5032 Chronic diastolic (congestive) heart failure: Secondary | ICD-10-CM | POA: Diagnosis not present

## 2022-03-06 DIAGNOSIS — M3505 Sjogren syndrome with inflammatory arthritis: Secondary | ICD-10-CM | POA: Diagnosis not present

## 2022-03-06 DIAGNOSIS — Z7960 Long term (current) use of unspecified immunomodulators and immunosuppressants: Secondary | ICD-10-CM | POA: Diagnosis not present

## 2022-03-06 DIAGNOSIS — M0609 Rheumatoid arthritis without rheumatoid factor, multiple sites: Secondary | ICD-10-CM | POA: Insufficient documentation

## 2022-03-08 ENCOUNTER — Ambulatory Visit (INDEPENDENT_AMBULATORY_CARE_PROVIDER_SITE_OTHER): Payer: Medicare Other | Admitting: Pharmacist

## 2022-03-08 DIAGNOSIS — I5032 Chronic diastolic (congestive) heart failure: Secondary | ICD-10-CM

## 2022-03-08 DIAGNOSIS — E1169 Type 2 diabetes mellitus with other specified complication: Secondary | ICD-10-CM

## 2022-03-08 NOTE — Patient Instructions (Signed)
Visit Information  Thank you for taking time to visit with me today. Please don't hesitate to contact me if I can be of assistance to you before our next scheduled telephone appointment.  Following are the goals we discussed today:   Goals Addressed             This Visit's Progress    Pharmacy Goals       Our goal A1c is less than 7%. This corresponds with fasting sugars less than 130 and 2 hour after meal sugars less than 180. Please keep a log of your results when checking your blood sugar  Our goal bad cholesterol, or LDL, is less than 70 . This is why it is important to continue taking your simvastatin  Please check your home blood pressure, keep a log of the results and bring this with you to your medical appointments.  Feel free to call me with any questions or concerns. I look forward to our next call!    Wallace Cullens, PharmD, Para March, CPP Clinical Pharmacist [facility] [phone]          Our next appointment is by telephone on 04/10/2022 at 9:15 AM  Please call the care guide team at [phone] if you need to cancel or reschedule your appointment.    Patient verbalizes understanding of instructions and care plan provided today and agrees to view in [place]. Active MyChart status and patient understanding of how to access instructions and care plan via MyChart confirmed with patient.

## 2022-03-08 NOTE — Chronic Care Management (AMB) (Signed)
Chronic Care Management CCM Pharmacy Note  03/08/2022 Name:  Diana Harmon MRN:  [id number] DOB:  1945/05/28   Subjective: Diana Harmon is an 76 y.o. year old female who is a primary patient of [name], DO.  The CCM team was consulted for assistance with disease management and care coordination needs.    Receive a voicemail from patient requesting a call back regarding cost/questions about a new medication - Enbrel  Engaged with patient and daughter by telephone for follow up visit for pharmacy case management and/or care coordination services.   Objective:  Medications Reviewed Today     Reviewed by Rennis Petty, RPH-CPP (Pharmacist) on 02/22/22 at 1012  Med List Status: <None>   Medication Order Taking? Sig Documenting Provider Last Dose Status Informant  ACCU-CHEK GUIDE test strip [id number]  USE TO CHECK BLOOD SUGAR TWICE DAILY [name], DO  Active   Accu-Chek Softclix Lancets lancets [id number]  USE TO CHECK GLUCOSE TWICE DAILY Parks Ranger, Devonne Doughty, DO  Active   acetaminophen (TYLENOL) 500 MG tablet [id number] Yes Take 1,000 mg by mouth every 6 (six) hours as needed for moderate pain.  [provider] Taking Active Child  Artificial Tear Solution (GENTEAL TEARS) 0.1-0.2-0.3 % SOLN [id number] Yes Place 1 drop into both eyes 3 (three) times daily as needed (dry/irritated eyes.). [provider] Taking Active Child  aspirin 81 MG chewable tablet [id number] Yes Chew 1 tablet (81 mg total) by mouth daily. Eileen Stanford, PA-C Taking Active Child  B COMPLEX-C PO [id number] Yes Take 10 mLs by mouth daily. [provider] Taking Active Child  baclofen (LIORESAL) 10 MG tablet [id number]  TAKE 1 TO 2 TABLETS BY MOUTH TWICE DAILY AS NEEDED FOR MUSCLE SPASM Parks Ranger, Devonne Doughty, DO  Active   Blood Glucose Monitoring Suppl (ACCU-CHEK GUIDE) w/Device KIT [id number]  Use to check blood sugar twice per day  [name], DO  Active   Cholecalciferol (VITAMIN D3) 125 MCG (5000 UT) TABS [id number] Yes Take 5,000 Units by mouth daily. [provider] Taking Active Child    Discontinued 09/05/19 1316   cyanocobalamin 1000 MCG tablet [id number] Yes Take by mouth every other day. [provider] Taking Active   Ferrous Sulfate (IRON) 325 (65 Fe) MG TABS [id number] Yes TAKE 1 TABLET BY MOUTH TWICE DAILY WITH A MEAL [name], Devonne Doughty, DO Taking Active Child  furosemide (LASIX) 20 MG tablet [id number] Yes Take 1 tablet by mouth once daily  Patient taking differently: Take 20 mg by mouth daily as needed.   End, Harrell Gave, MD Taking Active   hydrochlorothiazide (HYDRODIURIL) 25 MG tablet [id number] Yes Take 1 tablet (25 mg total) by mouth daily. Furth, Cadence H, PA-C Taking Active   hydroxychloroquine (PLAQUENIL) 200 MG tablet [id number] Yes Take 1 tablet by mouth 2 (two) times daily. [provider] Taking Active   lactobacillus acidophilus (BACID) TABS tablet [id number] Yes Take 1 tablet by mouth daily as needed (digestive health.). [provider] Taking Active            Med Note Nat Christen   Wed Nov 03, 2019  3:06 PM)    Lancets Misc. KIT [id number]  Use to check blood sugar twice daily [provider]  Active   levocetirizine (XYZAL) 5 MG tablet [id number] Yes TAKE 1 TABLET BY MOUTH ONCE DAILY IN THE EVENING  Patient taking differently: Take 1/2 tablet (2.5 mg) daily in the evening as needed for allergies  [name], DO Taking Active   losartan (COZAAR) 50 MG tablet [id number] Yes Take 1 tablet (50 mg total) by mouth daily. Furth, Cadence H, PA-C Taking Active   magnesium oxide (MAG-OX) 400 MG tablet [id number] Yes Take 400 mg by mouth daily. [provider] Taking Active   mupirocin ointment (BACTROBAN) 2 % [id number] Yes Apply 1 Application topically 2 (two) times daily.  Patient taking differently: Apply 1  Application topically 2 (two) times daily as needed.   [name], DO Taking Active   simvastatin (ZOCOR) 20 MG tablet [id number] Yes TAKE 1 TABLET BY MOUTH ONCE DAILY AT 6 PM [name], Devonne Doughty, DO Taking Active   TRULICITY 5.99 JT/7.0VX SOPN [id number] Yes Inject 0.75 mg into the skin once a week. [name], DO Taking Active             Pertinent Labs:  Lab Results  Component Value Date   HGBA1C 6.1 (A) 10/29/2021   Lab Results  Component Value Date   CHOL 155 04/20/2021   HDL 65 04/20/2021   LDLCALC 72 04/20/2021   TRIG 95 04/20/2021   CHOLHDL 2.4 04/20/2021   Lab Results  Component Value Date   CREATININE 1.27 (H) 01/11/2022   BUN 38 (H) 01/11/2022   NA 141 01/11/2022   K 4.0 01/11/2022   CL 108 01/11/2022   CO2 27 01/11/2022    SDOH:  (Social Determinants of Health) assessments and interventions performed:  SDOH Interventions    [place] Coordination from 01/21/2022 in [place] Coordination from 12/10/2021 in [place] from 05/18/2021 in [place] Management from 02/22/2021 in [place] Management from 10/30/2020 in [place] Management from 09/13/2019 in [place]  SDOH Interventions        Food Insecurity Interventions Intervention Not Indicated Intervention Not Indicated Intervention Not Indicated Intervention Not Indicated -- --  Housing Interventions Intervention Not Indicated Intervention Not Indicated Intervention Not Indicated Intervention Not Indicated -- --  Transportation Interventions Intervention Not Indicated Intervention Not Indicated Intervention Not Indicated Intervention Not Indicated -- --  Utilities Interventions Intervention Not Indicated -- -- -- -- --  Financial Strain Interventions --  Intervention Not Indicated Intervention Not Indicated Intervention Not Indicated -- --  Physical Activity Interventions -- Other (Comments)  [patient is working with specialist due to issues with muscles in neck and arm. She is limited in what she can do currently with being active] Intervention Not Indicated Other (Comments)  [The patient is doing exercises the PT has showed her] Other (Comments)  [Encourage physical activity] Other (Comments)  [does not do any structured activity]  Stress Interventions -- Intervention Not Indicated  [concerned over what is going on with her neck and arm muscles] Intervention Not Indicated Intervention Not Indicated -- --  Social Connections Interventions -- Intervention Not Indicated Intervention Not Indicated Other (Comment)  [good support system] -- --       CCM Care Plan  Review of patient past medical history, allergies, medications, health status, including review of consultants reports, laboratory and other test data, was performed as part of comprehensive evaluation and provision of chronic care management services.   Care Plan : PharmD - Medication Assistance/Management  Updates made by Rennis Petty, RPH-CPP since 03/08/2022 12:00 AM     Problem: Disease Progression      Long-Range  Goal: Disease Progression Prevented or Minimized   Start Date: 10/30/2020  Expected End Date: 01/28/2021  Recent Progress: On track  Priority: High  Note:   Current Barriers:  Unable to independently afford treatment regimen Patient APPROVED for [facility] Patient Assistance Program for Trulicity, eligible from 11/21/20-05/05/22  Pharmacist Clinical Goal(s):  Over the next 90 days, patient will verbalize ability to afford treatment regimen through collaboration with PharmD and provider.   Interventions: 1:1 collaboration with [name], DO regarding development and update of comprehensive plan of care as evidenced by provider attestation and  co-signature Inter-disciplinary care team collaboration (see longitudinal plan of care) Receive a voicemail from patient requesting a call back regarding cost/questions about a new medication - Enbrel From review of chart, note patient seen for Office Visit with [facility] Rheumatology on 03/06/2022. Related to Rheumatoid Arthrits, provider advised: -- Unable to use Methotrexate due to leukopenia -- Continue Plaquenil 200 mg twice Daily -- Start Enbrel  Return phone call to patient today. She reports provider sent Enbrel Rx to specialty pharmacy and advised patient to expect a call from specialty pharmacy within the next 2 weeks. Discuss medication assistance information related to Enbrel Address questions with patient/daughter regarding Enbrel Report they discussed with Rheumatologist treatment options and patient's other medical conditions, including history of aortic valve replacement. However, patient/daughter do not recall discussion of patient's heart failure. From review of chart, patient with diagnosis of chronic HFpEF for which patient takes Lasix PRN Note TNF blockers, such as Enbrel to be used with caution in patient's with HF due to risk of worsening this condition. Daughter plans to follow up with Rheumatology office to discuss further with provider  Medication Assistance: Collaborating with Revere Simcox to request her assistance to patient with re-enrollment in [place] Patient Assistance Program for Trulicity for 8329 calendar year Today patient/daughter report that they received, completed and mailed this application back to [facility]  Patient Goals/Self-Care Activities Over the next 90 days, patient will:  - take medications as prescribed - check glucose, document, and provide at future appointments - check blood pressure, document, and provide at future appointments - collaborate with provider on medication access solutions - attend medical appointments as  scheduled  Follow Up Plan: Telephone follow up appointment with care management team member scheduled for: 04/10/2022 at [place], PharmD, Para [place], [place] [phone]

## 2022-03-11 ENCOUNTER — Encounter: Payer: Self-pay | Admitting: Family Medicine

## 2022-03-11 ENCOUNTER — Other Ambulatory Visit: Payer: Self-pay | Admitting: Family Medicine

## 2022-03-11 ENCOUNTER — Telehealth: Payer: Medicare Other

## 2022-03-11 ENCOUNTER — Ambulatory Visit: Payer: Self-pay

## 2022-03-11 DIAGNOSIS — I5032 Chronic diastolic (congestive) heart failure: Secondary | ICD-10-CM

## 2022-03-11 DIAGNOSIS — E1169 Type 2 diabetes mellitus with other specified complication: Secondary | ICD-10-CM

## 2022-03-11 DIAGNOSIS — I10 Essential (primary) hypertension: Secondary | ICD-10-CM

## 2022-03-11 DIAGNOSIS — M62838 Other muscle spasm: Secondary | ICD-10-CM

## 2022-03-11 MED ORDER — BACLOFEN 10 MG PO TABS: ORAL_TABLET | ORAL | 5 refills | Status: DC

## 2022-03-11 NOTE — Chronic Care Management (AMB) (Signed)
Chronic Care Management   CCM RN Visit Note  03/11/2022 Name: Diana Harmon MRN: [id number] DOB: [birth year]  Subjective: Diana Harmon is a 76 y.o. year old female who is a primary care patient of [name], DO. The patient was referred to the Chronic Care Management team for assistance with care management needs subsequent to provider initiation of CCM services and plan of care.    Today's Visit:  Engaged with patient by telephone for initial visit.        Goals Addressed             This Visit's Progress    CCM Expected Outcome:  Monitor, Self-Manage and Reduce Symptoms of Diabetes       Current Barriers:  Chronic Disease Management support and education needs related to effective management of DM  Planned Interventions: Provided education to patient about basic DM disease process; Reviewed medications with patient and discussed importance of medication adherence;        Reviewed prescribed diet with patient heart healthy/ADA diet ; Counseled on importance of regular laboratory monitoring as prescribed;        Discussed plans with patient for ongoing care management follow up and provided patient with direct contact information for care management team;      Provided patient with written educational materials related to hypo and hyperglycemia and importance of correct treatment;       Reviewed scheduled/upcoming provider appointments including: 05-01-2022 at 1040 am;         Advised patient, providing education and rationale, to check cbg when you have symptoms of low or high blood sugar and as directed   and record        call provider for findings outside established parameters;       Referral made to pharmacy team for assistance with ongoing support and education for effective management of medications;       Review of patient status, including review of consultants reports, relevant laboratory and other test results, and medications  completed;       Advised patient to discuss changes or concerns about DM and DM health and well being with provider;      Screening for signs and symptoms of depression related to chronic disease state;        Assessed social determinant of health barriers;         Symptom Management: Take medications as prescribed   Attend all scheduled provider appointments Call provider office for new concerns or questions  call the Suicide and Crisis Lifeline: 988 call the [place] National Suicide Prevention Lifeline: 314 581 7928 or TTY: 902-210-8422 TTY 559-710-9531) to talk to a trained counselor call 1-800-273-TALK (toll free, 24 hour hotline) if experiencing a Mental Health or [place]  schedule appointment with eye doctor check feet daily for cuts, sores or redness trim toenails straight across wash and dry feet carefully every day wear comfortable, cotton socks wear comfortable, well-fitting shoes  Follow Up Plan: Telephone follow up appointment with care management team member scheduled for: 05-13-2022 at 1 pm       CCM Expected Outcome:  Monitor, Self-Manage and Reduce Symptoms of Heart Failure       Current Barriers:  Knowledge Deficits related to changes in fluid balance with CHF  Chronic Disease Management support and education needs related to effective management of CHF  Planned Interventions: Basic overview and discussion of pathophysiology of Heart Failure reviewed Provided education on low sodium diet Reviewed Heart Failure  Action Plan in depth and provided written copy Assessed need for readable accurate scales in home. The patient has scales and can safely weigh Provided education about placing scale on hard, flat surface Advised patient to weigh each morning after emptying bladder Discussed importance of daily weight and advised patient to weigh and record daily. The patient states her weight sometimes fluctuates between 232 and 237. The patient has been having  to take her Lasix more consistently. Education and support given Reviewed role of diuretics in prevention of fluid overload and management of heart failure. Is taking Lasix daily currently. Knows when she does not have swelling in her ankles to take only as needed.  Discussed the importance of keeping all appointments with provider Provided patient with education about the role of exercise in the management of heart failure Advised patient to discuss changes in water weight, questions or concerns about HF or heart health with provider Screening for signs and symptoms of depression related to chronic disease state  Assessed social determinant of health barriers The patients daughter spoke to the [facility] about a message she sent to Dr. Posey Pronto concerning the medication of Enbrel that he wants the patient to take. The daughter states this is not recommended for patients that have HF. Praised the daughter for being proactive. Will alert the pcp and pharm D as well as the findings and ask for additional information. The daughter states she has not heard back from Dr. Posey Pronto but she and the patient have discussed and the patient does not desire to take Enbrel.   Symptom Management: Take medications as prescribed   Attend all scheduled provider appointments Call pharmacy for medication refills 3-7 days in advance of running out of medications Call provider office for new concerns or questions  call the Suicide and Crisis Lifeline: 988 call the [place] National Suicide Prevention Lifeline: 414-299-8707 or TTY: (206)411-0221 TTY 306-482-9366) to talk to a trained counselor call 1-800-273-TALK (toll free, 24 hour hotline) if experiencing a Mental Health or [name]  call office if I gain more than 2 pounds in one day or 5 pounds in one week keep legs up while sitting track weight in diary use salt in moderation watch for swelling in feet, ankles and legs every day weigh myself daily develop a  rescue plan follow rescue plan if symptoms flare-up track symptoms and what helps feel better or worse dress right for the weather, hot or cold  Follow Up Plan: Telephone follow up appointment with care management team member scheduled for: 05-13-2022 at 1 pm       CCM Expected Outcome:  Monitor, Self-Manage, and Reduce Symptoms of Hypertension       Current Barriers:  Chronic Disease Management support and education needs related to effective management of HTN  Planned Interventions: Evaluation of current treatment plan related to hypertension self management and patient's adherence to plan as established by provider;   Provided education to patient re: stroke prevention, s/s of heart attack and stroke; Reviewed prescribed diet heart healthy/ADA diet  Reviewed medications with patient and discussed importance of compliance. Is compliant with medications. Does need a refill for baclofen. In basket message sent to the pcp for assistance with refill needs;  Discussed plans with patient for ongoing care management follow up and provided patient with direct contact information for care management team; Advised patient, providing education and rationale, to monitor blood pressure daily and record, calling PCP for findings outside established parameters. The patient states that her  blood pressures have been good at home. They have been around 127/65- she states that she does sometimes have elevated when she goes to the providers appointments. It was a little elevated when she saw Dr. Posey Pronto. Discussed white coat syndrome. ;  Reviewed scheduled/upcoming provider appointments including: 05-01-2022 at 1040 am Advised patient to discuss changes in blood pressures and heart health with provider; Provided education on prescribed diet heart healthy/ADA diet ;  Discussed complications of poorly controlled blood pressure such as heart disease, stroke, circulatory complications, vision complications, kidney  impairment, sexual dysfunction;  Screening for signs and symptoms of depression related to chronic disease state;  Assessed social determinant of health barriers;   Symptom Management: Take medications as prescribed   Attend all scheduled provider appointments Call provider office for new concerns or questions  call the Suicide and Crisis Lifeline: 988 call the [place] National Suicide Prevention Lifeline: (548)557-9527 or TTY: (716)515-7242 TTY 417-377-8547) to talk to a trained counselor call 1-800-273-TALK (toll free, 24 hour hotline) if experiencing a Mental Health or [place]  check blood pressure 3 times per week choose a place to take my blood pressure (home, clinic or office, retail store) write blood pressure results in a log or diary learn about high blood pressure keep a blood pressure log take blood pressure log to all doctor appointments call doctor for signs and symptoms of high blood pressure develop an action plan for high blood pressure keep all doctor appointments take medications for blood pressure exactly as prescribed report new symptoms to your doctor  Follow Up Plan: Telephone follow up appointment with care management team member scheduled for:05-13-2022 at 1 pm       COMPLETED: RNCM: Effective Management of Chronic Pain       Care Coordination Interventions:Closing this goal and see new plan of care in CCM Patient rates her neck and arm pain at a 7 today on a scale of 0-10 Reviewed provider established plan for pain management. The patient continues to work with PT on exercises and she says this has helped her a lot. She is working with the exercises to help her neck and other things. Education and support given.  Discussed importance of adherence to all scheduled medical appointments. Sees specialist and pcp on a regular basis.  Counseled on the importance of reporting any/all new or changed pain symptoms or management strategies to pain management  provider. Education and support given Advised patient to report to care team affect of pain on daily activities. Patient is working with PT.  Discussed use of relaxation techniques and/or diversional activities to assist with pain reduction (distraction, imagery, relaxation, massage, acupressure, TENS, heat, and cold application. Patient is compliant with recommendations from her providers. Reviewed with patient prescribed pharmacological and nonpharmacological pain relief strategies. The patient is compliant with medications Advised patient to discuss changes in level and intensity of pain and unresolved pain  with provider Screening for signs and symptoms of depression related to chronic disease state  Assessed social determinant of health barriers.           Plan:Telephone follow up appointment with care management team member scheduled for:  05-13-2022 at 1 pm  [place], MSN, CCM RN Care Manager  Chronic Care Management Direct Number: [phone]

## 2022-03-11 NOTE — Patient Instructions (Signed)
Please call the care guide team at [phone] if you need to cancel or reschedule your appointment.   If you are experiencing a Mental Health or [place] or need someone to talk to, please call the Suicide and Crisis Lifeline: 988 call the [place] National Suicide Prevention Lifeline: 619-530-6900 or TTY: 289-547-5364 TTY 6398514187) to talk to a trained counselor call 1-800-273-TALK (toll free, 24 hour hotline)   Following is a copy of your full provider care plan:   Goals Addressed             This Visit's Progress    CCM Expected Outcome:  Monitor, Self-Manage and Reduce Symptoms of Diabetes       Current Barriers:  Chronic Disease Management support and education needs related to effective management of DM  Planned Interventions: Provided education to patient about basic DM disease process; Reviewed medications with patient and discussed importance of medication adherence;        Reviewed prescribed diet with patient heart healthy/ADA diet ; Counseled on importance of regular laboratory monitoring as prescribed;        Discussed plans with patient for ongoing care management follow up and provided patient with direct contact information for care management team;      Provided patient with written educational materials related to hypo and hyperglycemia and importance of correct treatment;       Reviewed scheduled/upcoming provider appointments including: 05-01-2022 at 1040 am;         Advised patient, providing education and rationale, to check cbg when you have symptoms of low or high blood sugar and as directed   and record        call provider for findings outside established parameters;       Referral made to pharmacy team for assistance with ongoing support and education for effective management of medications;       Review of patient status, including review of consultants reports, relevant laboratory and other test results, and medications completed;        Advised patient to discuss changes or concerns about DM and DM health and well being with provider;      Screening for signs and symptoms of depression related to chronic disease state;        Assessed social determinant of health barriers;         Symptom Management: Take medications as prescribed   Attend all scheduled provider appointments Call provider office for new concerns or questions  call the Suicide and Crisis Lifeline: 988 call the [place] National Suicide Prevention Lifeline: (806)407-1211 or TTY: (276) 170-0429 TTY 606-320-3023) to talk to a trained counselor call 1-800-273-TALK (toll free, 24 hour hotline) if experiencing a Mental Health or [place]  schedule appointment with eye doctor check feet daily for cuts, sores or redness trim toenails straight across wash and dry feet carefully every day wear comfortable, cotton socks wear comfortable, well-fitting shoes  Follow Up Plan: Telephone follow up appointment with care management team member scheduled for: 05-13-2022 at 1 pm       CCM Expected Outcome:  Monitor, Self-Manage and Reduce Symptoms of Heart Failure       Current Barriers:  Knowledge Deficits related to changes in fluid balance with CHF  Chronic Disease Management support and education needs related to effective management of CHF  Planned Interventions: Basic overview and discussion of pathophysiology of Heart Failure reviewed Provided education on low sodium diet Reviewed Heart Failure Action Plan in depth and provided  written copy Assessed need for readable accurate scales in home. The patient has scales and can safely weigh Provided education about placing scale on hard, flat surface Advised patient to weigh each morning after emptying bladder Discussed importance of daily weight and advised patient to weigh and record daily. The patient states her weight sometimes fluctuates between 232 and 237. The patient has been having to take her  Lasix more consistently. Education and support given Reviewed role of diuretics in prevention of fluid overload and management of heart failure. Is taking Lasix daily currently. Knows when she does not have swelling in her ankles to take only as needed.  Discussed the importance of keeping all appointments with provider Provided patient with education about the role of exercise in the management of heart failure Advised patient to discuss changes in water weight, questions or concerns about HF or heart health with provider Screening for signs and symptoms of depression related to chronic disease state  Assessed social determinant of health barriers The patients daughter spoke to the [facility] about a message she sent to Dr. Posey Pronto concerning the medication of Enbrel that he wants the patient to take. The daughter states this is not recommended for patients that have HF. Praised the daughter for being proactive. Will alert the pcp and pharm D as well as the findings and ask for additional information. The daughter states she has not heard back from Dr. Posey Pronto but she and the patient have discussed and the patient does not desire to take Enbrel.   Symptom Management: Take medications as prescribed   Attend all scheduled provider appointments Call pharmacy for medication refills 3-7 days in advance of running out of medications Call provider office for new concerns or questions  call the Suicide and Crisis Lifeline: 988 call the [place] National Suicide Prevention Lifeline: 801 600 0930 or TTY: 281 117 7762 TTY [phone]) to talk to a trained counselor call 1-800-273-TALK (toll free, 24 hour hotline) if experiencing a Mental Health or [place]  call office if I gain more than 2 pounds in one day or 5 pounds in one week keep legs up while sitting track weight in diary use salt in moderation watch for swelling in feet, ankles and legs every day weigh myself daily develop a rescue  plan follow rescue plan if symptoms flare-up track symptoms and what helps feel better or worse dress right for the weather, hot or cold  Follow Up Plan: Telephone follow up appointment with care management team member scheduled for: 05-13-2022 at 1 pm       CCM Expected Outcome:  Monitor, Self-Manage, and Reduce Symptoms of Hypertension       Current Barriers:  Chronic Disease Management support and education needs related to effective management of HTN  Planned Interventions: Evaluation of current treatment plan related to hypertension self management and patient's adherence to plan as established by provider;   Provided education to patient re: stroke prevention, s/s of heart attack and stroke; Reviewed prescribed diet heart healthy/ADA diet  Reviewed medications with patient and discussed importance of compliance. Is compliant with medications. Does need a refill for baclofen. In basket message sent to the pcp for assistance with refill needs;  Discussed plans with patient for ongoing care management follow up and provided patient with direct contact information for care management team; Advised patient, providing education and rationale, to monitor blood pressure daily and record, calling PCP for findings outside established parameters. The patient states that her blood pressures have been good at  home. They have been around 127/65- she states that she does sometimes have elevated when she goes to the providers appointments. It was a little elevated when she saw Dr. Posey Pronto. Discussed white coat syndrome. ;  Reviewed scheduled/upcoming provider appointments including: 05-01-2022 at 1040 am Advised patient to discuss changes in blood pressures and heart health with provider; Provided education on prescribed diet heart healthy/ADA diet ;  Discussed complications of poorly controlled blood pressure such as heart disease, stroke, circulatory complications, vision complications, kidney impairment,  sexual dysfunction;  Screening for signs and symptoms of depression related to chronic disease state;  Assessed social determinant of health barriers;   Symptom Management: Take medications as prescribed   Attend all scheduled provider appointments Call provider office for new concerns or questions  call the Suicide and Crisis Lifeline: 988 call the [place] National Suicide Prevention Lifeline: 279 273 1199 or TTY: 380-220-6543 TTY 5108754649) to talk to a trained counselor call 1-800-273-TALK (toll free, 24 hour hotline) if experiencing a Mental Health or [place]  check blood pressure 3 times per week choose a place to take my blood pressure (home, clinic or office, retail store) write blood pressure results in a log or diary learn about high blood pressure keep a blood pressure log take blood pressure log to all doctor appointments call doctor for signs and symptoms of high blood pressure develop an action plan for high blood pressure keep all doctor appointments take medications for blood pressure exactly as prescribed report new symptoms to your doctor  Follow Up Plan: Telephone follow up appointment with care management team member scheduled for:05-13-2022 at 1 pm       COMPLETED: RNCM: Effective Management of Chronic Pain       Care Coordination Interventions:Closing this goal and see new plan of care in CCM Patient rates her neck and arm pain at a 7 today on a scale of 0-10 Reviewed provider established plan for pain management. The patient continues to work with PT on exercises and she says this has helped her a lot. She is working with the exercises to help her neck and other things. Education and support given.  Discussed importance of adherence to all scheduled medical appointments. Sees specialist and pcp on a regular basis.  Counseled on the importance of reporting any/all new or changed pain symptoms or management strategies to pain management provider.  Education and support given Advised patient to report to care team affect of pain on daily activities. Patient is working with PT.  Discussed use of relaxation techniques and/or diversional activities to assist with pain reduction (distraction, imagery, relaxation, massage, acupressure, TENS, heat, and cold application. Patient is compliant with recommendations from her providers. Reviewed with patient prescribed pharmacological and nonpharmacological pain relief strategies. The patient is compliant with medications Advised patient to discuss changes in level and intensity of pain and unresolved pain  with provider Screening for signs and symptoms of depression related to chronic disease state  Assessed social determinant of health barriers.           Patient verbalizes understanding of instructions and care plan provided today and agrees to view in [place]. Active MyChart status and patient understanding of how to access instructions and care plan via MyChart confirmed with patient.     Telephone follow up appointment with care management team member scheduled for: 05-13-2022 at 1 pm

## 2022-03-11 NOTE — Chronic Care Management (AMB) (Signed)
Chronic Care Management Provider Comprehensive Care Plan    03/11/2022 Name: Diana Harmon MRN: [id number] DOB: 1946-03-25  Referral to Chronic Care Management (CCM) services was placed by Provider:  Dr. Parks Ranger on Date: 02-14-2022.  Chronic Condition 1: CHF Provider Assessment and Plan She has occasional LLE for which she takes lasix PRN. She needed lasix last week due to dietary noncompliance (increased salt intake). She normally does not need lasix. No worsening SOB, orthopnea. Continue Lasix PRN.      Expected Outcome/Goals Addressed This Visit (Provider CCM goals/Provider Assessment and plan  CCM (HEART FAILURE)  EXPECTED OUTCOME:  MONITOR, SELF-MANAGE AND REDUCE SYMPTOMS OF HEART FAILURE   Symptom Management Condition 1: Take all medications as prescribed Attend all scheduled provider appointments Call provider office for new concerns or questions  call the Suicide and Crisis Lifeline: 988 call the [place] National Suicide Prevention Lifeline: (313)794-8213 or TTY: 386-092-0435 TTY [phone]) to talk to a trained counselor call 1-800-273-TALK (toll free, 24 hour hotline) if experiencing a Mental Health or [place]  call office if I gain more than 2 pounds in one day or 5 pounds in one week keep legs up while sitting track weight in diary use salt in moderation watch for swelling in feet, ankles and legs every day weigh myself daily develop a rescue plan follow rescue plan if symptoms flare-up  Chronic Condition 2: HTN Provider Assessment and Plan   Well-controlled HTN - Home BP readings reviewed  Complication meniere's disease, mild AS, CKD II   Plan:  1.  Continue current BP regimen HCTZ 105m daily, Losartan 552mdaily refill 2. Encourage improved lifestyle - low sodium diet, regular exercise 3. Continue monitor BP outside office, bring readings to next visit, if persistently >140/90 or new symptoms notify office sooner        Expected Outcome/Goals Addressed This Visit (Provider CCM goals/Provider Assessment and plan   CCM (HYPERTENSION)  EXPECTED OUTCOME:  MONITOR,SELF- MANAGE AND REDUCE SYMPTOMS OF HYPERTENSION   Symptom Management Condition 2: Take all medications as prescribed Attend all scheduled provider appointments Call provider office for new concerns or questions  call the Suicide and Crisis Lifeline: 988 call the [place] Lifeline: 1-2893657104r TTY: 1-402-683-4494TY (1819 460 1233to talk to a trained counselor call 1-800-273-TALK (toll free, 24 hour hotline) if experiencing a Mental Health or [place] pressure 3 times per week write blood pressure results in a log or diary learn about high blood pressure keep a blood pressure log take blood pressure log to all doctor appointments call doctor for signs and symptoms of high blood pressure keep all doctor appointments take medications for blood pressure exactly as prescribed report new symptoms to your doctor  Chronic Condition 3: DM Provider Assessment and Plan   A1c improved to 6.1 No hyperglycemia or hypoglycemia Complications - other including hyperlipidemia, GERD, obesity - increases risk of future cardiovascular complications    Plan:  Continue Trulicity 0.3.50KXeekly inj doing well on GLP1 - Lilly Cares PAP Off Metformin 2. Encourage improved lifestyle - low carb, low sugar diet, reduce portion size, continue improving regular exercise 3. Check CBG, bring log to next visit for review 4. Continue ASA, ARB, Statin           Relevant Orders    POCT glycosylated hemoglobin (Hb A1C) (Completed)     Expected Outcome/Goals Addressed This Visit (Provider CCM goals/Provider Assessment and plan   CCM (DIABETES) EXPECTED OUTCOME: MONITOR,  SELF-MANAGE AND REDUCE SYMPTOMS OF DIABETES   Symptom Management Condition 3: Take all medications as prescribed Attend all scheduled  provider appointments Attend church or other social activities Call provider office for new concerns or questions  call the Suicide and Crisis Lifeline: 988 call the [place] National Suicide Prevention Lifeline: 814-431-8437 or TTY: 330-116-3953 TTY 670-109-4850) to talk to a trained counselor call 1-800-273-TALK (toll free, 24 hour hotline) if experiencing a Mental Health or [place]  keep appointment with eye doctor check feet daily for cuts, sores or redness trim toenails straight across wash and dry feet carefully every day wear comfortable, cotton socks wear comfortable, well-fitting shoes   Problem List Patient Active Problem List   Diagnosis Date Noted   Protrusion of intervertebral disc of thoracic region 02/19/2021   Sjogren's syndrome with inflammatory arthritis ([place]) 02/19/2021   S/P TAVR (transcatheter aortic valve replacement) 11/09/2019   Patient is [name]    Severe aortic stenosis    Thyroid disease    Chronic heart failure with preserved ejection fraction (HFpEF) ([place]) 26/71/2458   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/22/2019   Arthralgia 04/22/2019   Normocytic anemia 02/17/2019   Primary osteoarthritis of right knee 02/06/2018   Morbid obesity ([place]) 11/05/2017   Neutropenia ([place]) 04/24/2017   Vitamin D deficiency 10/08/2016   Meniere's disease of right ear 07/04/2016   TIA (transient ischemic attack) 07/04/2016   Nonrheumatic aortic valve stenosis 07/04/2016   Seasonal allergies 01/30/2016   Recurrent UTI 10/26/2015   Iron deficiency anemia due to chronic blood loss 02/28/2015   History of adenomatous polyp of colon 02/13/2015   Osteoarthritis of both knees 12/05/2014   Acid reflux 12/05/2014   Benign neoplasm of colon 12/05/2014   Type 2 diabetes mellitus with other specified complication ([place]) 09/98/3382   Essential hypertension 12/05/2014   Hyperlipidemia associated with type 2 diabetes mellitus ([place])  12/05/2014   Arthritis, degenerative 12/05/2014   Anemia due to multiple mechanisms 12/05/2014   Abnormal liver enzymes 12/05/2014   Anemia 12/05/2014    Medication Management  Current Outpatient Medications:    ACCU-CHEK GUIDE test strip, USE TO CHECK BLOOD SUGAR TWICE DAILY, Disp: 200 each, Rfl: 1   Accu-Chek Softclix Lancets lancets, USE TO CHECK GLUCOSE TWICE DAILY, Disp: 200 each, Rfl: 1   acetaminophen (TYLENOL) 500 MG tablet, Take 1,000 mg by mouth every 6 (six) hours as needed for moderate pain. , Disp: , Rfl:    Artificial Tear Solution (GENTEAL TEARS) 0.1-0.2-0.3 % SOLN, Place 1 drop into both eyes 3 (three) times daily as needed (dry/irritated eyes.)., Disp: , Rfl:    aspirin 81 MG chewable tablet, Chew 1 tablet (81 mg total) by mouth daily., Disp: , Rfl:    B COMPLEX-C PO, Take 10 mLs by mouth daily., Disp: , Rfl:    baclofen (LIORESAL) 10 MG tablet, TAKE 1 TO 2 TABLETS BY MOUTH TWICE DAILY AS NEEDED FOR MUSCLE SPASM, Disp: 60 tablet, Rfl: 0   Blood Glucose Monitoring Suppl (ACCU-CHEK GUIDE) w/Device KIT, Use to check blood sugar twice per day, Disp: 1 kit, Rfl: 0   Cholecalciferol (VITAMIN D3) 125 MCG (5000 UT) TABS, Take 5,000 Units by mouth daily., Disp: , Rfl:    cyanocobalamin 1000 MCG tablet, Take by mouth every other day., Disp: , Rfl:    Ferrous Sulfate (IRON) 325 (65 Fe) MG TABS, TAKE 1 TABLET BY MOUTH TWICE DAILY WITH A MEAL, Disp: 180 each, Rfl: 1   furosemide (LASIX) 20 MG tablet,  Take 1 tablet by mouth once daily (Patient taking differently: Take 20 mg by mouth daily as needed.), Disp: 90 tablet, Rfl: 1   hydrochlorothiazide (HYDRODIURIL) 25 MG tablet, Take 1 tablet (25 mg total) by mouth daily., Disp: 90 tablet, Rfl: 2   hydroxychloroquine (PLAQUENIL) 200 MG tablet, Take 1 tablet by mouth 2 (two) times daily., Disp: , Rfl:    lactobacillus acidophilus (BACID) TABS tablet, Take 1 tablet by mouth daily as needed (digestive health.)., Disp: , Rfl:    Lancets Misc. KIT,  Use to check blood sugar twice daily, Disp: , Rfl:    levocetirizine (XYZAL) 5 MG tablet, TAKE 1 TABLET BY MOUTH ONCE DAILY IN THE EVENING (Patient taking differently: Take 1/2 tablet (2.5 mg) daily in the evening as needed for allergies), Disp: 90 tablet, Rfl: 3   losartan (COZAAR) 50 MG tablet, Take 1 tablet (50 mg total) by mouth daily., Disp: 90 tablet, Rfl: 2   magnesium oxide (MAG-OX) 400 MG tablet, Take 400 mg by mouth daily., Disp: , Rfl:    mupirocin ointment (BACTROBAN) 2 %, Apply 1 Application topically 2 (two) times daily. (Patient taking differently: Apply 1 Application topically 2 (two) times daily as needed.), Disp: 22 g, Rfl: 3   simvastatin (ZOCOR) 20 MG tablet, TAKE 1 TABLET BY MOUTH ONCE DAILY AT 6 PM, Disp: 90 tablet, Rfl: 1   TRULICITY 1.93 XT/0.2IO SOPN, Inject 0.75 mg into the skin once a week., Disp: 12 mL, Rfl: 1  Cognitive Assessment Identity Confirmed: : Name; DOB Cognitive Status: Normal   Functional Assessment No data recorded  Caregiver Assessment  No data recorded  Planned Interventions  Provided education to patient about basic DM disease process; Reviewed medications with patient and discussed importance of medication adherence;        Reviewed prescribed diet with patient heart healthy/ADA diet ; Counseled on importance of regular laboratory monitoring as prescribed;        Discussed plans with patient for ongoing care management follow up and provided patient with direct contact information for care management team;      Provided patient with written educational materials related to hypo and hyperglycemia and importance of correct treatment;       Reviewed scheduled/upcoming provider appointments including: 05-01-2022 at 1040 am;         Advised patient, providing education and rationale, to check cbg when you have symptoms of low or high blood sugar and as directed   and record        call provider for findings outside established parameters;        Referral made to pharmacy team for assistance with ongoing support and education for effective management of medications;       Review of patient status, including review of consultants reports, relevant laboratory and other test results, and medications completed;       Advised patient to discuss changes or concerns about DM and DM health and well being with provider;      Screening for signs and symptoms of depression related to chronic disease state;        Assessed social determinant of health barriers;        Basic overview and discussion of pathophysiology of Heart Failure reviewed Provided education on low sodium diet Reviewed Heart Failure Action Plan in depth and provided written copy Assessed need for readable accurate scales in home. The patient has scales and can safely weigh Provided education about placing scale on hard, flat surface Advised  patient to weigh each morning after emptying bladder Discussed importance of daily weight and advised patient to weigh and record daily. The patient states her weight sometimes fluctuates between 232 and 237. The patient has been having to take her Lasix more consistently. Education and support given Reviewed role of diuretics in prevention of fluid overload and management of heart failure. Is taking Lasix daily currently. Knows when she does not have swelling in her ankles to take only as needed.  Discussed the importance of keeping all appointments with provider Provided patient with education about the role of exercise in the management of heart failure Advised patient to discuss changes in water weight, questions or concerns about HF or heart health with provider Screening for signs and symptoms of depression related to chronic disease state  Assessed social determinant of health barriers The patients daughter spoke to the [facility] about a message she sent to Dr. Posey Pronto concerning the medication of Enbrel that he wants the patient to take. The  daughter states this is not recommended for patients that have HF. Praised the daughter for being proactive. Will alert the pcp and pharm D as well as the findings and ask for additional information. The daughter states she has not heard back from Dr. Posey Pronto but she and the patient have discussed and the patient does not desire to take Enbrel.  Take medications as prescribed   Attend all scheduled provider appointments Call provider office for new concerns or questions  call the Suicide and Crisis Lifeline: 988 call the [place] National Suicide Prevention Lifeline: 272-215-5464 or TTY: 772-442-3736 TTY 854 473 4003) to talk to a trained counselor call 1-800-273-TALK (toll free, 24 hour hotline) if experiencing a Mental Health or [place]  check blood pressure 3 times per week choose a place to take my blood pressure (home, clinic or office, retail store) write blood pressure results in a log or diary learn about high blood pressure keep a blood pressure log take blood pressure log to all doctor appointments call doctor for signs and symptoms of high blood pressure develop an action plan for high blood pressure keep all doctor appointments take medications for blood pressure exactly as prescribed report new symptoms to your doctor  Interaction and coordination with outside resources, practitioners, and providers See CCM Referral  Care Plan: Available in MyChart

## 2022-03-13 ENCOUNTER — Telehealth: Payer: Self-pay | Admitting: Pharmacy Technician

## 2022-03-13 DIAGNOSIS — Z596 Low income: Secondary | ICD-10-CM

## 2022-03-13 NOTE — Progress Notes (Signed)
[facility])                                            [place] Team    03/13/2022  Libbie [place]ley [place] 17-Jan-1946 [id number]  Received both patient and provider portion(s) of patient assistance application(s) for Trulicity. Faxed completed application and required documents into [place].    Kayliana Codd P. Nathaniel Yaden, [place]  [phone]

## 2022-04-04 ENCOUNTER — Other Ambulatory Visit: Payer: Self-pay | Admitting: Medical

## 2022-04-04 DIAGNOSIS — I110 Hypertensive heart disease with heart failure: Secondary | ICD-10-CM | POA: Diagnosis not present

## 2022-04-04 DIAGNOSIS — I5030 Unspecified diastolic (congestive) heart failure: Secondary | ICD-10-CM

## 2022-04-04 DIAGNOSIS — E1159 Type 2 diabetes mellitus with other circulatory complications: Secondary | ICD-10-CM

## 2022-04-04 DIAGNOSIS — I10 Essential (primary) hypertension: Secondary | ICD-10-CM

## 2022-04-08 ENCOUNTER — Telehealth: Payer: Self-pay | Admitting: Pharmacy Technician

## 2022-04-08 DIAGNOSIS — Z596 Low income: Secondary | ICD-10-CM

## 2022-04-08 DIAGNOSIS — E119 Type 2 diabetes mellitus without complications: Secondary | ICD-10-CM | POA: Diagnosis not present

## 2022-04-08 DIAGNOSIS — R768 Other specified abnormal immunological findings in serum: Secondary | ICD-10-CM | POA: Diagnosis not present

## 2022-04-08 DIAGNOSIS — M542 Cervicalgia: Secondary | ICD-10-CM | POA: Diagnosis not present

## 2022-04-08 DIAGNOSIS — G5603 Carpal tunnel syndrome, bilateral upper limbs: Secondary | ICD-10-CM | POA: Diagnosis not present

## 2022-04-08 DIAGNOSIS — R200 Anesthesia of skin: Secondary | ICD-10-CM | POA: Diagnosis not present

## 2022-04-08 NOTE — Progress Notes (Signed)
[facility])                                            [place] Team    04/08/2022  Diana Harmon 12-13-1945 [id number]  Care coordination call placed to [place] in regard to Trulicity application.  Spoke to [place] who informs patient is APPROVED 05/06/22-05/06/23. Medication will be delivered to address on file which is her daughter's address. Patient is aware of her approval.  Muneer Leider P. Raniyah Curenton, [place]  [phone]

## 2022-04-10 ENCOUNTER — Ambulatory Visit (INDEPENDENT_AMBULATORY_CARE_PROVIDER_SITE_OTHER): Payer: Medicare Other | Admitting: Pharmacist

## 2022-04-10 DIAGNOSIS — I10 Essential (primary) hypertension: Secondary | ICD-10-CM

## 2022-04-10 DIAGNOSIS — I5032 Chronic diastolic (congestive) heart failure: Secondary | ICD-10-CM

## 2022-04-10 DIAGNOSIS — E1169 Type 2 diabetes mellitus with other specified complication: Secondary | ICD-10-CM

## 2022-04-10 NOTE — Patient Instructions (Signed)
Visit Information  Thank you for taking time to visit with me today. Please don't hesitate to contact me if I can be of assistance to you before our next scheduled telephone appointment.  Following are the goals we discussed today:   Goals Addressed             This Visit's Progress    Pharmacy Goals       Our goal A1c is less than 7%. This corresponds with fasting sugars less than 130 and 2 hour after meal sugars less than 180. Please keep a log of your results when checking your blood sugar  Our goal bad cholesterol, or LDL, is less than 70 . This is why it is important to continue taking your simvastatin  Please check your home blood pressure, keep a log of the results and bring this with you to your medical appointments.  Feel free to call me with any questions or concerns. I look forward to our next call!   Wallace Cullens, PharmD, [place], CPP Clinical Pharmacist [facility] [phone]          Our next appointment is by telephone on 06/26/2022 at 9:15 AM  Please call the care guide team at [phone] if you need to cancel or reschedule your appointment.    Patient verbalizes understanding of instructions and care plan provided today and agrees to view in [place]. Active MyChart status and patient understanding of how to access instructions and care plan via MyChart confirmed with patient.

## 2022-04-10 NOTE — Chronic Care Management (AMB) (Signed)
Chronic Care Management CCM Pharmacy Note  04/10/2022 Name:  Diana Harmon MRN:  [id number] DOB:  1945-07-04   Subjective: Diana Harmon is an 76 y.o. year old female who is a primary patient of [name], DO.  The CCM team was consulted for assistance with disease management and care coordination needs.    Engaged with patient by telephone for follow up visit for pharmacy case management and/or care coordination services.   Objective:  Medications Reviewed Today     Reviewed by Rennis Petty, RPH-CPP (Pharmacist) on 04/10/22 at 1348  Med List Status: <None>   Medication Order Taking? Sig Documenting Provider Last Dose Status Informant  ACCU-CHEK GUIDE test strip [id number]  USE TO CHECK BLOOD SUGAR TWICE DAILY [name], DO  Active   Accu-Chek Softclix Lancets lancets [id number]  USE TO CHECK GLUCOSE TWICE DAILY Parks Ranger, Devonne Doughty, DO  Active   acetaminophen (TYLENOL) 500 MG tablet [id number]  Take 1,000 mg by mouth every 6 (six) hours as needed for moderate pain.  [provider]  Active Child  Artificial Tear Solution (GENTEAL TEARS) 0.1-0.2-0.3 % SOLN [id number]  Place 1 drop into both eyes 3 (three) times daily as needed (dry/irritated eyes.). [provider]  Active Child  aspirin 81 MG chewable tablet [id number]  Chew 1 tablet (81 mg total) by mouth daily. [name]  Active Child  B COMPLEX-C PO [id number]  Take 10 mLs by mouth daily. [provider]  Active Child  baclofen (LIORESAL) 10 MG tablet [id number]  TAKE 1 TO 2 TABLETS BY MOUTH TWICE DAILY AS NEEDED FOR MUSCLE SPASM Parks Ranger, Devonne Doughty, DO  Active   Blood Glucose Monitoring Suppl (ACCU-CHEK GUIDE) w/Device KIT [id number]  Use to check blood sugar twice per day [name], DO  Active   Cholecalciferol (VITAMIN D3) 125 MCG (5000 UT) TABS [id number]  Take 5,000 Units by mouth daily. [provider]   Active Child    Discontinued 09/05/19 1316   cyanocobalamin 1000 MCG tablet [id number]  Take by mouth every other day. [provider]  Active   Ferrous Sulfate (IRON) 325 (65 Fe) MG TABS [id number]  TAKE 1 TABLET BY MOUTH TWICE DAILY WITH A MEAL [name], Devonne Doughty, DO  Active Child  furosemide (LASIX) 20 MG tablet [id number] Yes Take 1 tablet by mouth once daily  Patient taking differently: Take 20 mg by mouth daily as needed.   End, Harrell Gave, MD Taking Active   hydrochlorothiazide (HYDRODIURIL) 25 MG tablet [id number] Yes Take 1 tablet by mouth once daily [name], MD Taking Active   hydroxychloroquine (PLAQUENIL) 200 MG tablet [id number]  Take 1 tablet by mouth 2 (two) times daily. [provider]  Active   lactobacillus acidophilus (BACID) TABS tablet [id number]  Take 1 tablet by mouth daily as needed (digestive health.). [provider]  Active            Med Note Nat Christen   Wed Nov 03, 2019  3:06 PM)    Lancets Misc. KIT [id number]  Use to check blood sugar twice daily [provider]  Active   levocetirizine (XYZAL) 5 MG tablet [id number]  TAKE 1 TABLET BY MOUTH ONCE DAILY IN THE EVENING  Patient taking differently: Take 1/2 tablet (2.5 mg) daily in the evening as needed for allergies   [name], DO  Active   losartan (COZAAR) 50 MG tablet [id number] Yes Take 1 tablet by mouth once  daily [name], MD Taking Active   magnesium oxide (MAG-OX) 400 MG tablet [id number]  Take 400 mg by mouth daily. [provider]  Active   mupirocin ointment (BACTROBAN) 2 % [id number]  Apply 1 Application topically 2 (two) times daily.  Patient taking differently: Apply 1 Application topically 2 (two) times daily as needed.   [name], DO  Active   simvastatin (ZOCOR) 20 MG tablet [id number]  TAKE 1 TABLET BY MOUTH ONCE DAILY AT 6 PM [name], Devonne Doughty, DO  Active   TRULICITY 8.91 QX/4.5WT SOPN  [id number] Yes Inject 0.75 mg into the skin once a week. [name], DO Taking Active             Pertinent Labs:   Lab Results  Component Value Date   HGBA1C 6.1 (A) 10/29/2021   Lab Results  Component Value Date   CHOL 155 04/20/2021   HDL 65 04/20/2021   LDLCALC 72 04/20/2021   TRIG 95 04/20/2021   CHOLHDL 2.4 04/20/2021   Lab Results  Component Value Date   CREATININE 1.27 (H) 01/11/2022   BUN 38 (H) 01/11/2022   NA 141 01/11/2022   K 4.0 01/11/2022   CL 108 01/11/2022   CO2 27 01/11/2022   BP Readings from Last 3 Encounters:  01/11/22 (!) 127/55  11/15/21 (!) 142/72  10/29/21 (!) 135/44   Pulse Readings from Last 3 Encounters:  01/11/22 78  11/15/21 75  10/29/21 80     SDOH:  (Social Determinants of Health) assessments and interventions performed:  SDOH Interventions    [place] Coordination from 01/21/2022 in [place] Coordination from 12/10/2021 in [place] from 05/18/2021 in [place] Management from 02/22/2021 in [place] Management from 10/30/2020 in [place] Management from 09/13/2019 in [place]  SDOH Interventions        Food Insecurity Interventions Intervention Not Indicated Intervention Not Indicated Intervention Not Indicated Intervention Not Indicated -- --  Housing Interventions Intervention Not Indicated Intervention Not Indicated Intervention Not Indicated Intervention Not Indicated -- --  Transportation Interventions Intervention Not Indicated Intervention Not Indicated Intervention Not Indicated Intervention Not Indicated -- --  Utilities Interventions Intervention Not Indicated -- -- -- -- --  Financial Strain Interventions -- Intervention Not Indicated Intervention Not Indicated Intervention Not Indicated  -- --  Physical Activity Interventions -- Other (Comments)  [patient is working with specialist due to issues with muscles in neck and arm. She is limited in what she can do currently with being active] Intervention Not Indicated Other (Comments)  [The patient is doing exercises the PT has showed her] Other (Comments)  [Encourage physical activity] Other (Comments)  [does not do any structured activity]  Stress Interventions -- Intervention Not Indicated  [concerned over what is going on with her neck and arm muscles] Intervention Not Indicated Intervention Not Indicated -- --  Social Connections Interventions -- Intervention Not Indicated Intervention Not Indicated Other (Comment)  [good support system] -- --       CCM Care Plan  Review of patient past medical history, allergies, medications, health status, including review of consultants reports, laboratory and other test data, was performed as part of comprehensive evaluation and provision of chronic care management services.   Care Plan : PharmD - Medication Assistance/Management  Updates made by Rennis Petty, RPH-CPP since  04/10/2022 12:00 AM     Problem: Disease Progression      Long-Range Goal: Disease Progression Prevented or Minimized   Start Date: 10/30/2020  Expected End Date: 01/28/2021  Recent Progress: On track  Priority: High  Note:   Current Barriers:  Unable to independently afford treatment regimen Patient APPROVED for [facility] Patient Assistance Program for Trulicity, eligible from 11/21/20-05/05/22  Pharmacist Clinical Goal(s):  Over the next 90 days, patient will verbalize ability to afford treatment regimen through collaboration with PharmD and provider.   Interventions: 1:1 collaboration with [name], DO regarding development and update of comprehensive plan of care as evidenced by provider attestation and co-signature Inter-disciplinary care team collaboration (see longitudinal plan of  care) From review of chart, note patient sent a message to advise that she spoke with her Rheumatologist, Dr. Posey Pronto, regarding a plan to have her start Orencia, rather than Enbrel. Today patient reports she was advised by Rheumatologist to expect call from his office regarding the start of the [place], but has not yet heard back. States that she will place a call to [facility] Rheumatology today to follow up  Medication Assistance: Collaborated with [place] for assistance to patient with re-enrollment in [place] Patient Assistance Program for Trulicity for 1610 calendar year Received message from [place] on 12/4 advising patient is APPROVED for re-enrollment 05/06/22-05/06/23  Notify patient today Patient interested in patient assistance programs to help with future cost of Orencia. Counsel patient on requirements for the [place] patient assistance program through [employer], including the annual out of pocket expense requirement  T2DM: Control improved based on latest A1C reading; current treatment: Trulicity 9.60 mg weekly Previous therapies: metformin Reports recent blood sugar readings: Last checked this morning: 109; reports recent blood sugar results ranging 115-182 with checking throughout the day (before and after meals) Denies symptoms of hypoglycemia Cholesterol management: simvastatin 20 mg daily Exercise Reports exercise limited by arthritis Continuing exercises from recent PT/OT and stretching at home Encourage patient to have regular well-balanced meals throughout the day, while controlling carbohydrate portion sizes Encourage patient to continue to monitor home blood sugar, keep log and bring record with her to medical appointments   HTN/Chronic HFpEF: Current treatment: HCTZ 25 mg daily Losartan 50 mg daily Furosemide 20 mg daily as needed for edema Note patient has home upper arm blood pressure monitor Reports recent home blood pressure  readings: Today: 130/56, HR 80 Yesterday: 133/54, HR 80 Reports continues to elevate legs and using compression socks during the day Have counseled on impact of salt/sodium in diet on blood pressure Encouraged patient to bring record of daily weights and home BP readings with her to medical appointments, but to call provider sooner if readings outside of established parameters  Patient Goals/Self-Care Activities Over the next 90 days, patient will:  - take medications as prescribed - check glucose, document, and provide at future appointments - check blood pressure, document, and provide at future appointments - collaborate with provider on medication access solutions - attend medical appointments as scheduled         Plan: Telephone follow up appointment with care management team member scheduled for:  06/26/2022 at [place], PharmD, Para March, CPP Clinical Pharmacist [place] [phone]

## 2022-04-23 ENCOUNTER — Other Ambulatory Visit: Payer: Self-pay

## 2022-04-23 DIAGNOSIS — Z0000 Encounter for general adult medical examination without abnormal findings: Secondary | ICD-10-CM

## 2022-04-23 DIAGNOSIS — E113393 Type 2 diabetes mellitus with moderate nonproliferative diabetic retinopathy without macular edema, bilateral: Secondary | ICD-10-CM | POA: Diagnosis not present

## 2022-04-23 DIAGNOSIS — E1169 Type 2 diabetes mellitus with other specified complication: Secondary | ICD-10-CM

## 2022-04-23 DIAGNOSIS — D649 Anemia, unspecified: Secondary | ICD-10-CM

## 2022-04-23 DIAGNOSIS — M3505 Sjogren syndrome with inflammatory arthritis: Secondary | ICD-10-CM

## 2022-04-24 ENCOUNTER — Other Ambulatory Visit: Payer: Medicare Other

## 2022-04-24 DIAGNOSIS — E785 Hyperlipidemia, unspecified: Secondary | ICD-10-CM | POA: Diagnosis not present

## 2022-04-24 DIAGNOSIS — M3505 Sjogren syndrome with inflammatory arthritis: Secondary | ICD-10-CM | POA: Diagnosis not present

## 2022-04-24 DIAGNOSIS — Z0000 Encounter for general adult medical examination without abnormal findings: Secondary | ICD-10-CM | POA: Diagnosis not present

## 2022-04-24 DIAGNOSIS — D649 Anemia, unspecified: Secondary | ICD-10-CM | POA: Diagnosis not present

## 2022-04-24 DIAGNOSIS — E1169 Type 2 diabetes mellitus with other specified complication: Secondary | ICD-10-CM | POA: Diagnosis not present

## 2022-04-25 LAB — LIPID PANEL
Cholesterol: 159 mg/dL (ref <200)
HDL: 76 mg/dL (ref >=50)
LDL Cholesterol (Calc): 67 mg/dL (calc)
Non-HDL Cholesterol (Calc): 83 mg/dL (calc) (ref <130)
Total CHOL/HDL Ratio: 2.1 (calc) (ref <5.0)
Triglycerides: 77 mg/dL (ref <150)

## 2022-04-25 LAB — COMPLETE METABOLIC PANEL WITH GFR
AG Ratio: 1.3 (calc) (ref 1.0–2.5)
ALT: 14 U/L (ref 6–29)
AST: 21 U/L (ref 10–35)
Albumin: 3.8 g/dL (ref 3.6–5.1)
Alkaline phosphatase (APISO): 63 U/L (ref 37–153)
BUN/Creatinine Ratio: 24 (calc) — ABNORMAL HIGH (ref 6–22)
BUN: 34 mg/dL — ABNORMAL HIGH (ref 7–25)
CO2: 28 mmol/L (ref 20–32)
Calcium: 9.2 mg/dL (ref 8.6–10.4)
Chloride: 105 mmol/L (ref 98–110)
Creat: 1.44 mg/dL — ABNORMAL HIGH (ref 0.60–1.00)
Globulin: 3 g/dL (calc) (ref 1.9–3.7)
Glucose, Bld: 148 mg/dL — ABNORMAL HIGH (ref 65–99)
Potassium: 4 mmol/L (ref 3.5–5.3)
Sodium: 145 mmol/L (ref 135–146)
Total Bilirubin: 0.4 mg/dL (ref 0.2–1.2)
Total Protein: 6.8 g/dL (ref 6.1–8.1)
eGFR: 38 mL/min/{1.73_m2} — ABNORMAL LOW (ref >=60)

## 2022-04-25 LAB — CBC WITH DIFFERENTIAL/PLATELET
Absolute Monocytes: 443 cells/uL (ref 200–950)
Basophils Absolute: 31 cells/uL (ref 0–200)
Basophils Relative: 1 %
Eosinophils Absolute: 112 cells/uL (ref 15–500)
Eosinophils Relative: 3.6 %
HCT: 33.6 % — ABNORMAL LOW (ref 35.0–45.0)
Hemoglobin: 10.9 g/dL — ABNORMAL LOW (ref 11.7–15.5)
Lymphs Abs: 1299 cells/uL (ref 850–3900)
MCH: 30 pg (ref 27.0–33.0)
MCHC: 32.4 g/dL (ref 32.0–36.0)
MCV: 92.6 fL (ref 80.0–100.0)
MPV: 10.3 fL (ref 7.5–12.5)
Monocytes Relative: 14.3 %
Neutro Abs: 1215 cells/uL — ABNORMAL LOW (ref 1500–7800)
Neutrophils Relative %: 39.2 %
Platelets: 162 10*3/uL (ref 140–400)
RBC: 3.63 10*6/uL — ABNORMAL LOW (ref 3.80–5.10)
RDW: 12.9 % (ref 11.0–15.0)
Total Lymphocyte: 41.9 %
WBC: 3.1 10*3/uL — ABNORMAL LOW (ref 3.8–10.8)

## 2022-04-25 LAB — HEMOGLOBIN A1C
Hgb A1c MFr Bld: 6.7 % of total Hgb — ABNORMAL HIGH (ref <5.7)
Mean Plasma Glucose: 146 mg/dL
eAG (mmol/L): 8.1 mmol/L

## 2022-04-25 LAB — TSH: TSH: 2.36 mIU/L (ref 0.40–4.50)

## 2022-05-01 ENCOUNTER — Ambulatory Visit (INDEPENDENT_AMBULATORY_CARE_PROVIDER_SITE_OTHER): Payer: Medicare Other | Admitting: Family Medicine

## 2022-05-01 ENCOUNTER — Other Ambulatory Visit: Payer: Self-pay | Admitting: Family Medicine

## 2022-05-01 VITALS — BP 138/68 | HR 79 | Resp 15 | Ht 60.0 in | Wt 243.6 lb

## 2022-05-01 DIAGNOSIS — M3505 Sjogren syndrome with inflammatory arthritis: Secondary | ICD-10-CM | POA: Diagnosis not present

## 2022-05-01 DIAGNOSIS — E1169 Type 2 diabetes mellitus with other specified complication: Secondary | ICD-10-CM

## 2022-05-01 DIAGNOSIS — D708 Other neutropenia: Secondary | ICD-10-CM | POA: Diagnosis not present

## 2022-05-01 DIAGNOSIS — I10 Essential (primary) hypertension: Secondary | ICD-10-CM | POA: Diagnosis not present

## 2022-05-01 DIAGNOSIS — D709 Neutropenia, unspecified: Secondary | ICD-10-CM | POA: Diagnosis not present

## 2022-05-01 DIAGNOSIS — D649 Anemia, unspecified: Secondary | ICD-10-CM

## 2022-05-01 DIAGNOSIS — Z0000 Encounter for general adult medical examination without abnormal findings: Secondary | ICD-10-CM

## 2022-05-01 DIAGNOSIS — I5032 Chronic diastolic (congestive) heart failure: Secondary | ICD-10-CM | POA: Diagnosis not present

## 2022-05-01 DIAGNOSIS — E785 Hyperlipidemia, unspecified: Secondary | ICD-10-CM

## 2022-05-01 NOTE — Assessment & Plan Note (Addendum)
Followed by Rheumatology On Hydroxychloroquine, Prednisone

## 2022-05-01 NOTE — Progress Notes (Signed)
 Subjective:    Patient ID: Diana Harmon, female    DOB: 11/18/1945, 76 y.o.   MRN: [id number]  Diana Harmon is a 76 y.o. female presenting on 05/01/2022 for Annual Exam  Here with daughter, [name]   HPI  Here for Annual Physical and lab Review  CHRONIC DM, Type 2 with Hyperlipidemia, Morbid Obesity HTN CKD-III Creatinine mild elevated 1.4 range, prior 1.2 to 1.4 Prior A1c trend 6.1 to 6.7 in past. She has maintained now at A1c 6.7 Admits diet changes since last visit had been on Trulicity 0.75mg weekly inj and major improved diet Side effect on metformin has stopped. CBGs: Improved. Avg 140s - stable overall Meds - Trulicity 0.75mg weekly inj - on financial assistance Reports good compliance, tolerating med well Currently on ARB, ASA 81, Simvastatin 20mg Lifestyle: Wt gain 5 lbs in 3-4 months Improving DM diet. Low carb. Improved diet with more fish, BBQ, more greens, kale. - Followed by Dr Phillip Bell optometry - last exam done 10/22/21. Negative. Denies hypoglycemia   Anemia, chronic Last lab Hgb 10.9 stable to improved   PMH Sjogren's Syndrome Controlled on Hydroxychloroquine She admits issue with itching of skin. She is using eye drops and compresses on eyes.  CHF No recent flare up or sudden wt gain or fluid build up  Health Maintenance: UTD Flu vaccine     05/01/2022   10:43 AM 12/10/2021    2:21 PM 05/18/2021   10:31 AM  Depression screen PHQ 2/9  Decreased Interest 0 0 0  Down, Depressed, Hopeless 0 0 0  PHQ - 2 Score 0 0 0  Altered sleeping 0    Tired, decreased energy 0    Change in appetite 0    Feeling bad or failure about yourself  0    Trouble concentrating 0    Moving slowly or fidgety/restless 0    Suicidal thoughts 0    PHQ-9 Score 0    Difficult doing work/chores Not difficult at all      Past Medical History:  Diagnosis Date   Anemia    Arthritis    legs, knees, back, wrists   Diabetes mellitus without  complication (HCC)    Hypertension    Nausea    Patient is [name]    S/P TAVR (transcatheter aortic valve replacement) 11/09/2019   s/p TAVR with a 26 mm Edwards S3U via the TF approach by Dr. McAlhany & Bartle   Severe aortic stenosis    SIRS (systemic inflammatory response syndrome) (HCC) 11/2019   Sjogren's disease (HCC)    Thyroid disease    Past Surgical History:  Procedure Laterality Date   CARDIAC CATHETERIZATION  10/19/2019   COLON SURGERY     caudarized polyps; rectal carcinoid excision ~ 2011   COLONOSCOPY     COLONOSCOPY WITH PROPOFOL N/A 03/13/2015   Procedure: COLONOSCOPY WITH PROPOFOL;  Surgeon: Robert T Elliott, MD;  Location: [facility];  Service: Endoscopy;  Laterality: N/A;   ESOPHAGOGASTRODUODENOSCOPY N/A 03/13/2015   Procedure: ESOPHAGOGASTRODUODENOSCOPY (EGD);  Surgeon: Robert T Elliott, MD;  Location: [facility];  Service: Endoscopy;  Laterality: N/A;   INTRAOPERATIVE TRANSTHORACIC ECHOCARDIOGRAM N/A 11/09/2019   Procedure: INTRAOPERATIVE TRANSTHORACIC ECHOCARDIOGRAM;  Surgeon: McAlhany, Christopher D, MD;  Location: [facility];  Service: Open Heart Surgery;  Laterality: N/A;   PARATHYROIDECTOMY  11/20/2004   right superior parathyroidecomy for primary hyperparathyroidism/parathyroid adenoma   RIGHT/LEFT HEART CATH AND CORONARY ANGIOGRAPHY N/A 10/19/2019   Procedure: RIGHT/LEFT HEART CATH AND CORONARY   ANGIOGRAPHY;  Surgeon: End, Christopher, MD;  Location: [facility];  Service: Cardiovascular;  Laterality: N/A;   THYROIDECTOMY     TRANSCATHETER AORTIC VALVE REPLACEMENT, TRANSFEMORAL N/A 11/09/2019   Procedure: TRANSCATHETER AORTIC VALVE REPLACEMENT, TRANSFEMORAL;  Surgeon: McAlhany, Christopher D, MD;  Location: [facility] OR;  Service: Open Heart Surgery;  Laterality: N/A;   Social History   Socioeconomic History   Marital status: Divorced    Spouse name: Not on file   Number of children: 2   Years of education: 12   Highest education level: 12th  grade  Occupational History   Occupation: retired-worked with nandicapped adults  Tobacco Use   Smoking status: Former    Years: 20.00    Types: Cigarettes    Quit date: 09/05/1979    Years since quitting: 42.6   Smokeless tobacco: Former  Vaping Use   Vaping Use: Never used  Substance and Sexual Activity   Alcohol use: Yes    Alcohol/week: 0.0 - 1.0 standard drinks of alcohol    Comment: occ. wine   Drug use: No   Sexual activity: Not on file  Other Topics Concern   Not on file  Social History Narrative   Not on file   Social Determinants of Health   Financial Resource Strain: Low Risk  (12/10/2021)   Overall Financial Resource Strain (CARDIA)    Difficulty of Paying Living Expenses: Not hard at all  Food Insecurity: No Food Insecurity (01/21/2022)   Hunger Vital Sign    Worried About Running Out of Food in the Last Year: Never true    Ran Out of Food in the Last Year: Never true  Transportation Needs: No Transportation Needs (01/21/2022)   PRAPARE - Transportation    Lack of Transportation (Medical): No    Lack of Transportation (Non-Medical): No  Physical Activity: Insufficiently Active (12/10/2021)   Exercise Vital Sign    Days of Exercise per Week: 5 days    Minutes of Exercise per Session: 20 min  Stress: No Stress Concern Present (12/10/2021)   [employer] - Occupational Stress Questionnaire    Feeling of Stress : Only a little  Social Connections: Moderately Isolated (12/10/2021)   Social Connection and Isolation Panel [NHANES]    Frequency of Communication with Friends and Family: Twice a week    Frequency of Social Gatherings with Friends and Family: Never    Attends Religious Services: More than 4 times per year    Active Member of Clubs or Organizations: Yes    Attends Club or Organization Meetings: 1 to 4 times per year    Marital Status: Divorced  Intimate Partner Violence: Not At Risk (01/21/2022)   Humiliation, Afraid, Rape, and Kick  questionnaire    Fear of Current or Ex-Partner: No    Emotionally Abused: No    Physically Abused: No    Sexually Abused: No   Family History  Problem Relation Age of Onset   Parkinson's disease Mother    Heart disease Father    Lupus Sister    Aneurysm Son    Lupus Son    Breast cancer Maternal Aunt        mat great aunt   Current Outpatient Medications on File Prior to Visit  Medication Sig   ACCU-CHEK GUIDE test strip USE TO CHECK BLOOD SUGAR TWICE DAILY   Accu-Chek Softclix Lancets lancets USE TO CHECK GLUCOSE TWICE DAILY   acetaminophen (TYLENOL) 500 MG tablet Take 1,000 mg by mouth   every 6 (six) hours as needed for moderate pain.    Artificial Tear Solution (GENTEAL TEARS) 0.1-0.2-0.3 % SOLN Place 1 drop into both eyes 3 (three) times daily as needed (dry/irritated eyes.).   aspirin 81 MG chewable tablet Chew 1 tablet (81 mg total) by mouth daily.   B COMPLEX-C PO Take 10 mLs by mouth daily.   baclofen (LIORESAL) 10 MG tablet TAKE 1 TO 2 TABLETS BY MOUTH TWICE DAILY AS NEEDED FOR MUSCLE SPASM   Blood Glucose Monitoring Suppl (ACCU-CHEK GUIDE) w/Device KIT Use to check blood sugar twice per day   Cholecalciferol (VITAMIN D3) 125 MCG (5000 UT) TABS Take 5,000 Units by mouth daily.   cyanocobalamin 1000 MCG tablet Take by mouth every other day.   Ferrous Sulfate (IRON) 325 (65 Fe) MG TABS TAKE 1 TABLET BY MOUTH TWICE DAILY WITH A MEAL   furosemide (LASIX) 20 MG tablet Take 1 tablet by mouth once daily (Patient taking differently: Take 20 mg by mouth daily as needed.)   hydrochlorothiazide (HYDRODIURIL) 25 MG tablet Take 1 tablet by mouth once daily   hydroxychloroquine (PLAQUENIL) 200 MG tablet Take 1 tablet by mouth 2 (two) times daily.   lactobacillus acidophilus (BACID) TABS tablet Take 1 tablet by mouth daily as needed (digestive health.).   Lancets Misc. KIT Use to check blood sugar twice daily   levocetirizine (XYZAL) 5 MG tablet TAKE 1 TABLET BY MOUTH ONCE DAILY IN THE  EVENING (Patient taking differently: Take 1/2 tablet (2.5 mg) daily in the evening as needed for allergies)   losartan (COZAAR) 50 MG tablet Take 1 tablet by mouth once daily   magnesium oxide (MAG-OX) 400 MG tablet Take 400 mg by mouth daily.   mupirocin ointment (BACTROBAN) 2 % Apply 1 Application topically 2 (two) times daily. (Patient taking differently: Apply 1 Application topically 2 (two) times daily as needed.)   simvastatin (ZOCOR) 20 MG tablet TAKE 1 TABLET BY MOUTH ONCE DAILY AT 6 PM   TRULICITY 9.89 QJ/1.9ER SOPN Inject 0.75 mg into the skin once a week.   [DISCONTINUED] Cromolyn Sodium (NASAL ALLERGY NA) Place 1 spray into the nose daily as needed (allergies).   No current facility-administered medications on file prior to visit.    Review of Systems  Constitutional:  Negative for activity change, appetite change, chills, diaphoresis, fatigue and fever.  HENT:  Negative for congestion and hearing loss.   Eyes:  Negative for visual disturbance.  Respiratory:  Negative for cough, chest tightness, shortness of breath and wheezing.   Cardiovascular:  Negative for chest pain, palpitations and leg swelling.  Gastrointestinal:  Negative for abdominal pain, constipation, diarrhea, nausea and vomiting.  Genitourinary:  Negative for dysuria, frequency and hematuria.  Musculoskeletal:  Negative for arthralgias and neck pain.  Skin:  Negative for rash.  Neurological:  Negative for dizziness, weakness, light-headedness, numbness and headaches.  Hematological:  Negative for adenopathy.  Psychiatric/Behavioral:  Negative for behavioral problems, dysphoric mood and sleep disturbance.    Per HPI unless specifically indicated above      Objective:    BP 138/68 (BP Location: Right Arm, Patient Position: Sitting, Cuff Size: Large)   Pulse 79   Resp 15   Ht 5' (1.524 m)   Wt 243 lb 9.6 oz (110.5 kg)   SpO2 96%   BMI 47.57 kg/m   Wt Readings from Last 3 Encounters:  05/01/22 243 lb 9.6  oz (110.5 kg)  01/11/22 238 lb 1.6 oz (108 kg)  11/15/21 230  lb (104.3 kg)    Physical Exam Vitals and nursing note reviewed.  Constitutional:      General: She is not in acute distress.    Appearance: She is well-developed. She is not diaphoretic.     Comments: Well-appearing, comfortable, cooperative  HENT:     Head: Normocephalic and atraumatic.  Eyes:     General:        Right eye: No discharge.        Left eye: No discharge.     Conjunctiva/sclera: Conjunctivae normal.     Pupils: Pupils are equal, round, and reactive to light.  Neck:     Thyroid: No thyromegaly.     Vascular: No carotid bruit.  Cardiovascular:     Rate and Rhythm: Normal rate and regular rhythm.     Pulses: Normal pulses.     Heart sounds: Normal heart sounds. No murmur heard. Pulmonary:     Effort: Pulmonary effort is normal. No respiratory distress.     Breath sounds: Normal breath sounds. No wheezing or rales.  Abdominal:     General: Bowel sounds are normal. There is no distension.     Palpations: Abdomen is soft. There is no mass.     Tenderness: There is no abdominal tenderness.  Musculoskeletal:        General: No tenderness. Normal range of motion.     Cervical back: Normal range of motion and neck supple.     Right lower leg: Edema (+1 pitting edema, has compression socks) present.     Left lower leg: Edema present.     Comments: Upper / Lower Extremities: - Normal muscle tone, strength bilateral upper extremities 5/5, lower extremities 5/5  Lymphadenopathy:     Cervical: No cervical adenopathy.  Skin:    General: Skin is warm and dry.     Findings: No erythema or rash.  Neurological:     Mental Status: She is alert and oriented to person, place, and time.     Comments: Distal sensation intact to light touch all extremities  Psychiatric:        Mood and Affect: Mood normal.        Behavior: Behavior normal.        Thought Content: Thought content normal.     Comments: Well groomed,  good eye contact, normal speech and thoughts     Diabetic Foot Exam - Simple   Simple Foot Form Diabetic Foot exam was performed with the following findings: Yes 05/01/2022 11:01 AM  Visual Inspection See comments: Yes Sensation Testing Intact to touch and monofilament testing bilaterally: Yes Pulse Check Posterior Tibialis and Dorsalis pulse intact bilaterally: Yes Comments Bilateral dry skin flaking. Eczema. Callus. No ulceration.      Results for orders placed or performed in visit on 04/23/22  TSH  Result Value Ref Range   TSH 2.36 0.40 - 4.50 mIU/L  Hemoglobin A1c  Result Value Ref Range   Hgb A1c MFr Bld 6.7 (H) <5.7 % of total Hgb   Mean Plasma Glucose 146 mg/dL   eAG (mmol/L) 8.1 mmol/L  Lipid panel  Result Value Ref Range   Cholesterol 159 <200 mg/dL   HDL 76 > OR = 50 mg/dL   Triglycerides 77 <150 mg/dL   LDL Cholesterol (Calc) 67 mg/dL (calc)   Total CHOL/HDL Ratio 2.1 <5.0 (calc)   Non-HDL Cholesterol (Calc) 83 <130 mg/dL (calc)  CBC with Differential/Platelet  Result Value Ref Range   WBC 3.1 (L) 3.8 -   10.8 Thousand/uL   RBC 3.63 (L) 3.80 - 5.10 Million/uL   Hemoglobin 10.9 (L) 11.7 - 15.5 g/dL   HCT 33.6 (L) 35.0 - 45.0 %   MCV 92.6 80.0 - 100.0 fL   MCH 30.0 27.0 - 33.0 pg   MCHC 32.4 32.0 - 36.0 g/dL   RDW 12.9 11.0 - 15.0 %   Platelets 162 140 - 400 Thousand/uL   MPV 10.3 7.5 - 12.5 fL   Neutro Abs 1,215 (L) 1,500 - 7,800 cells/uL   Lymphs Abs 1,299 850 - 3,900 cells/uL   Absolute Monocytes 443 200 - 950 cells/uL   Eosinophils Absolute 112 15 - 500 cells/uL   Basophils Absolute 31 0 - 200 cells/uL   Neutrophils Relative % 39.2 %   Total Lymphocyte 41.9 %   Monocytes Relative 14.3 %   Eosinophils Relative 3.6 %   Basophils Relative 1.0 %  COMPLETE METABOLIC PANEL WITH GFR  Result Value Ref Range   Glucose, Bld 148 (H) 65 - 99 mg/dL   BUN 34 (H) 7 - 25 mg/dL   Creat 1.44 (H) 0.60 - 1.00 mg/dL   eGFR 38 (L) > OR = 60 mL/min/1.5m    BUN/Creatinine Ratio 24 (H) 6 - 22 (calc)   Sodium 145 135 - 146 mmol/L   Potassium 4.0 3.5 - 5.3 mmol/L   Chloride 105 98 - 110 mmol/L   CO2 28 20 - 32 mmol/L   Calcium 9.2 8.6 - 10.4 mg/dL   Total Protein 6.8 6.1 - 8.1 g/dL   Albumin 3.8 3.6 - 5.1 g/dL   Globulin 3.0 1.9 - 3.7 g/dL (calc)   AG Ratio 1.3 1.0 - 2.5 (calc)   Total Bilirubin 0.4 0.2 - 1.2 mg/dL   Alkaline phosphatase (APISO) 63 37 - 153 U/L   AST 21 10 - 35 U/L   ALT 14 6 - 29 U/L      Assessment & Plan:   Problem List Items Addressed This Visit     Chronic heart failure with preserved ejection fraction (HFpEF) (HCC)    Stable without flare Euvolemic On med management      Essential hypertension    Well-controlled HTN - Home BP readings reviewed  Complication meniere's disease, mild AS, CKD II   Plan:  1.  Continue current BP regimen HCTZ 267mdaily, Losartan 5031maily 2. Encourage improved lifestyle - low sodium diet, regular exercise 3. Continue monitor BP outside office, bring readings to next visit, if persistently >140/90 or new symptoms notify office sooner      Hyperlipidemia associated with type 2 diabetes mellitus ([facility]  Controlled  Plan: 1. Continue current meds - Simvastatin 50m56mily 2. Encourage improved lifestyle - low carb/cholesterol, reduce portion size, continue improving activity as tolerated      Morbid obesity (HCC)    BMI >47 Counsel diet lifestyle exercise      Neutropenia (HCC)    [facility] at 3.1      Normocytic anemia   Sjog[place]'s syndrome with inflammatory arthritis (HCC)[place] Followed by Rheumatology On Hydroxychloroquine, Prednisone      Type 2 diabetes mellitus with other specified complication (HCC)    A1c Y6Tto 6.7 but it remains very well controlled No hyperglycemia or hypoglycemia Complications - other including hyperlipidemia, GERD, obesity - increases risk of future cardiovascular complications   Plan:  Continue  Trulicity 0.750.35WSkly inj doing well on GLP1 - Lilly Cares PAP  Off Metformin. Consider future Mounjaro vs higher dose if indicated 2. Encourage improved lifestyle - low carb, low sugar diet, reduce portion size, continue improving regular exercise 3. Check CBG, bring log to next visit for review 4. Continue ASA, ARB, Statin       Relevant Orders   Urine Microalbumin w/creat. ratio   Other Visit Diagnoses     Annual physical exam    -  Primary   Chronic benign neutropenia ([place])   (Chronic)           Orders Placed This Encounter  Procedures   Urine Microalbumin w/creat. ratio     No orders of the defined types were placed in this encounter.     Follow up plan: Return in about 6 months (around 10/31/2022) for 6 month NON fasting lab then 1 week later Follow-up DM CKD.  Future labs 10/2022 CMET A1c CBC  [name], DO [facility] 05/01/2022, 10:59 AM

## 2022-05-01 NOTE — Assessment & Plan Note (Addendum)
A1c up to 6.7 but it remains very well controlled No hyperglycemia or hypoglycemia Complications - other including hyperlipidemia, GERD, obesity - increases risk of future cardiovascular complications   Plan:  Continue Trulicity 0.'75mg'$  weekly inj doing well on GLP1 - Lilly Cares PAP Off Metformin. Consider future Mounjaro vs higher dose if indicated 2. Encourage improved lifestyle - low carb, low sugar diet, reduce portion size, continue improving regular exercise 3. Check CBG, bring log to next visit for review 4. Continue ASA, ARB, Statin

## 2022-05-01 NOTE — Assessment & Plan Note (Signed)
Followed by [facility] Dr Rogue Bussing Stable WBC at 3.1

## 2022-05-01 NOTE — Assessment & Plan Note (Signed)
BMI >47 Counsel diet lifestyle exercise

## 2022-05-01 NOTE — Assessment & Plan Note (Signed)
Well-controlled HTN - Home BP readings reviewed  Complication meniere's disease, mild AS, CKD II   Plan:  1.  Continue current BP regimen HCTZ '25mg'$  daily, Losartan '50mg'$  daily 2. Encourage improved lifestyle - low sodium diet, regular exercise 3. Continue monitor BP outside office, bring readings to next visit, if persistently >140/90 or new symptoms notify office sooner

## 2022-05-01 NOTE — Patient Instructions (Addendum)
Thank you for coming to the office today.  Elevated Creatinine mild 1.4 similar to prior 1.2 to 1.4 range. This suggest slight decrease in kidney function but overall fairly stable in past 1-2 years. We can check every 6 months  Possible Itching due to Plaquenil (Hydroxychloroquine) but it is rare side effect, may check with your Rheumatologist.  Try the Xyzal more regularly to see if this works!  Recent Labs    10/29/21 1056 04/24/22 0812  HGBA1C 6.1* 6.7*   Excellent blood sugar , keep up the great work.  Likely arthritis nodules on finger joints with bone spurs.  DUE for NON FASTING BLOOD WORK   SCHEDULE "Lab Only" visit in the morning at the clinic for lab draw in 6 MONTHS   - Make sure Lab Only appointment is at about 1 week before your next appointment, so that results will be available  For Lab Results, once available within 2-3 days of blood draw, you can can log in to MyChart online to view your results and a brief explanation. Also, we can discuss results at next follow-up visit.   Please schedule a Follow-up Appointment to: Return in about 6 months (around 10/31/2022) for 6 month NON fasting lab then 1 week later Follow-up DM CKD.  If you have any other questions or concerns, please feel free to call the office or send a message through [facility]. You may also schedule an earlier appointment if necessary.  Additionally, you may be receiving a survey about your experience at our office within a few days to 1 week by e-mail or mail. We value your feedback.  [name], DO [place]

## 2022-05-01 NOTE — Assessment & Plan Note (Signed)
Stable without flare Euvolemic On med management

## 2022-05-01 NOTE — Assessment & Plan Note (Addendum)
Controlled  Plan: 1. Continue current meds - Simvastatin '20mg'$  daily 2. Encourage improved lifestyle - low carb/cholesterol, reduce portion size, continue improving activity as tolerated

## 2022-05-02 LAB — MICROALBUMIN / CREATININE URINE RATIO
Creatinine, Urine: 104 mg/dL (ref 20–275)
Microalb, Ur: <0.2 mg/dL

## 2022-05-05 DIAGNOSIS — I110 Hypertensive heart disease with heart failure: Secondary | ICD-10-CM | POA: Diagnosis not present

## 2022-05-05 DIAGNOSIS — I5030 Unspecified diastolic (congestive) heart failure: Secondary | ICD-10-CM | POA: Diagnosis not present

## 2022-05-05 DIAGNOSIS — E1159 Type 2 diabetes mellitus with other circulatory complications: Secondary | ICD-10-CM

## 2022-05-13 ENCOUNTER — Ambulatory Visit (INDEPENDENT_AMBULATORY_CARE_PROVIDER_SITE_OTHER): Payer: Medicare Other

## 2022-05-13 ENCOUNTER — Telehealth: Payer: Medicare Other

## 2022-05-13 DIAGNOSIS — I5032 Chronic diastolic (congestive) heart failure: Secondary | ICD-10-CM

## 2022-05-13 DIAGNOSIS — I10 Essential (primary) hypertension: Secondary | ICD-10-CM

## 2022-05-13 DIAGNOSIS — E1169 Type 2 diabetes mellitus with other specified complication: Secondary | ICD-10-CM

## 2022-05-13 NOTE — Plan of Care (Signed)
Chronic Care Management Provider Comprehensive Care Plan    05/13/2022 Name: Elsy Chiang MRN: [id number] DOB: 1945/09/06  Referral to Chronic Care Management (CCM) services was placed by Provider:  Dr. Parks Ranger on Date: 02-14-2022.  Chronic Condition 1: HF Provider Assessment and Plan She has occasional LLE for which she takes lasix PRN. She needed lasix last week due to dietary noncompliance (increased salt intake). She normally does not need lasix. No worsening SOB, orthopnea. Continue Lasix PRN.    Expected Outcome/Goals Addressed This Visit (Provider CCM goals/Provider Assessment and plan   CCM (HF)  EXPECTED OUTCOME:  MONITOR,SELF- MANAGE AND REDUCE SYMPTOMS OF HF  Symptom Management Condition 1: Take all medications as prescribed Attend all scheduled provider appointments Call provider office for new concerns or questions  call the Suicide and Crisis Lifeline: 988 call the [place] National Suicide Prevention Lifeline: 4785104787 or TTY: 2175461121 TTY (912)621-1449) to talk to a trained counselor call 1-800-273-TALK (toll free, 24 hour hotline) if experiencing a Mental Health or [place]  call office if I gain more than 2 pounds in one day or 5 pounds in one week keep legs up while sitting track weight in diary use salt in moderation watch for swelling in feet, ankles and legs every day weigh myself daily develop a rescue plan follow rescue plan if symptoms flare-up eat more whole grains, fruits and vegetables, lean meats and healthy fats track symptoms and what helps feel better or worse dress right for the weather, hot or cold  Chronic Condition 2: HTN Provider Assessment and Plan      Chronic Care Management Provider Comprehensive Care Plan     03/11/2022 Name: Layne Lebon     MRN: [id number]       DOB: 1945-11-02   Referral to Chronic Care Management (CCM) services was placed by Provider:  Dr. Parks Ranger on Date:  02-14-2022.   Chronic Condition 1: CHF Provider Assessment and Plan She has occasional LLE for which she takes lasix PRN. She needed lasix last week due to dietary noncompliance (increased salt intake). She normally does not need lasix. No worsening SOB, orthopnea. Continue Lasix PRN.       Expected Outcome/Goals Addressed This Visit (Provider CCM goals/Provider Assessment and plan  CCM (HEART FAILURE)  EXPECTED OUTCOME:  MONITOR, SELF-MANAGE AND REDUCE SYMPTOMS OF HEART FAILURE     Symptom Management Condition 1: Take all medications as prescribed Attend all scheduled provider appointments Call provider office for new concerns or questions  call the Suicide and Crisis Lifeline: 988 call the [place] National Suicide Prevention Lifeline: 365-514-5202 or TTY: (304)563-7582 TTY 208-661-0838) to talk to a trained counselor call 1-800-273-TALK (toll free, 24 hour hotline) if experiencing a Mental Health or [place]  call office if I gain more than 2 pounds in one day or 5 pounds in one week keep legs up while sitting track weight in diary use salt in moderation watch for swelling in feet, ankles and legs every day weigh myself daily develop a rescue plan follow rescue plan if symptoms flare-up   Chronic Condition 2: HTN Provider Assessment and Plan   Well-controlled HTN - Home BP readings reviewed  Complication meniere's disease, mild AS, CKD II   Plan:  1.  Continue current BP regimen HCTZ '25mg'$  daily, Losartan '50mg'$  daily refill 2. Encourage improved lifestyle - low sodium diet, regular exercise 3. Continue monitor BP outside office, bring readings to next visit, if persistently >140/90 or new symptoms notify office  sooner       Expected Outcome/Goals Addressed This Visit (Provider CCM goals/Provider Assessment and plan   CCM (HYPERTENSION)  EXPECTED OUTCOME:  MONITOR,SELF- MANAGE AND REDUCE SYMPTOMS OF HYPERTENSION  Symptom Management Condition 2: Take all  medications as prescribed Attend all scheduled provider appointments Call provider office for new concerns or questions  call the Suicide and Crisis Lifeline: 988 call the [place] National Suicide Prevention Lifeline: (336)751-8566 or TTY: 212 497 0931 TTY [phone]) to talk to a trained counselor call 1-800-273-TALK (toll free, 24 hour hotline) if experiencing a Mental Health or [place]  check blood pressure weekly learn about high blood pressure take blood pressure log to all doctor appointments call doctor for signs and symptoms of high blood pressure keep all doctor appointments take medications for blood pressure exactly as prescribed report new symptoms to your doctor  Chronic Condition 3: DM Provider Assessment and Plan A1c  6.7  No hyperglycemia or hypoglycemia Complications - other including hyperlipidemia, GERD, obesity - increases risk of future cardiovascular complications    Plan:  Continue Trulicity 0.'75mg'$  weekly inj doing well on GLP1 - [facility] Cares PAP Off Metformin 2. Encourage improved lifestyle - low carb, low sugar diet, reduce portion size, continue improving regular exercise 3. Check CBG, bring log to next visit for review 4. Continue ASA, ARB, Statin    Expected Outcome/Goals Addressed This Visit (Provider CCM goals/Provider Assessment and plan   CCM (Diabetes)  EXPECTED OUTCOME:  MONITOR,SELF- MANAGE AND REDUCE SYMPTOMS OF Diabetes  Symptom Management Condition 3: Take all medications as prescribed Attend all scheduled provider appointments Call provider office for new concerns or questions  call the Suicide and Crisis Lifeline: 988 call the [place] National Suicide Prevention Lifeline: 873-725-9240 or TTY: 512-235-8888 TTY (423)292-3405) to talk to a trained counselor call 1-800-273-TALK (toll free, 24 hour hotline) if experiencing a Mental Health or [place]  keep appointment with eye doctor check feet daily for cuts,  sores or redness trim toenails straight across manage portion size wash and dry feet carefully every day wear comfortable, cotton socks wear comfortable, well-fitting shoes   Problem List Patient Active Problem List   Diagnosis Date Noted   Protrusion of intervertebral disc of thoracic region 02/19/2021   Sjogren's syndrome with inflammatory arthritis ([place]) 02/19/2021   S/P TAVR (transcatheter aortic valve replacement) 11/09/2019   Patient is [name]    Severe aortic stenosis    Thyroid disease    Chronic heart failure with preserved ejection fraction (HFpEF) ([place]) 89/16/9450   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/22/2019   Arthralgia 04/22/2019   Normocytic anemia 02/17/2019   Primary osteoarthritis of right knee 02/06/2018   Morbid obesity ([place]) 11/05/2017   Neutropenia ([place]) 04/24/2017   Vitamin D deficiency 10/08/2016   Meniere's disease of right ear 07/04/2016   TIA (transient ischemic attack) 07/04/2016   Nonrheumatic aortic valve stenosis 07/04/2016   Seasonal allergies 01/30/2016   Recurrent UTI 10/26/2015   Iron deficiency anemia due to chronic blood loss 02/28/2015   History of adenomatous polyp of colon 02/13/2015   Osteoarthritis of both knees 12/05/2014   Acid reflux 12/05/2014   Benign neoplasm of colon 12/05/2014   Type 2 diabetes mellitus with other specified complication ([place]) 38/88/2800   Essential hypertension 12/05/2014   Hyperlipidemia associated with type 2 diabetes mellitus ([place]) 12/05/2014   Arthritis, degenerative 12/05/2014   Anemia due to multiple mechanisms 12/05/2014   Abnormal liver enzymes 12/05/2014   Anemia 12/05/2014    Medication  Management  Current Outpatient Medications:    ACCU-CHEK GUIDE test strip, USE TO CHECK BLOOD SUGAR TWICE DAILY, Disp: 200 each, Rfl: 1   Accu-Chek Softclix Lancets lancets, USE TO CHECK GLUCOSE TWICE DAILY, Disp: 200 each, Rfl: 1   acetaminophen (TYLENOL) 500 MG tablet, Take  1,000 mg by mouth every 6 (six) hours as needed for moderate pain. , Disp: , Rfl:    Artificial Tear Solution (GENTEAL TEARS) 0.1-0.2-0.3 % SOLN, Place 1 drop into both eyes 3 (three) times daily as needed (dry/irritated eyes.)., Disp: , Rfl:    aspirin 81 MG chewable tablet, Chew 1 tablet (81 mg total) by mouth daily., Disp: , Rfl:    B COMPLEX-C PO, Take 10 mLs by mouth daily., Disp: , Rfl:    baclofen (LIORESAL) 10 MG tablet, TAKE 1 TO 2 TABLETS BY MOUTH TWICE DAILY AS NEEDED FOR MUSCLE SPASM, Disp: 60 tablet, Rfl: 5   Blood Glucose Monitoring Suppl (ACCU-CHEK GUIDE) w/Device KIT, Use to check blood sugar twice per day, Disp: 1 kit, Rfl: 0   Cholecalciferol (VITAMIN D3) 125 MCG (5000 UT) TABS, Take 5,000 Units by mouth daily., Disp: , Rfl:    cyanocobalamin 1000 MCG tablet, Take by mouth every other day., Disp: , Rfl:    Ferrous Sulfate (IRON) 325 (65 Fe) MG TABS, TAKE 1 TABLET BY MOUTH TWICE DAILY WITH A MEAL, Disp: 180 each, Rfl: 1   furosemide (LASIX) 20 MG tablet, Take 1 tablet by mouth once daily (Patient taking differently: Take 20 mg by mouth daily as needed.), Disp: 90 tablet, Rfl: 1   hydrochlorothiazide (HYDRODIURIL) 25 MG tablet, Take 1 tablet by mouth once daily, Disp: 90 tablet, Rfl: 3   hydroxychloroquine (PLAQUENIL) 200 MG tablet, Take 1 tablet by mouth 2 (two) times daily., Disp: , Rfl:    lactobacillus acidophilus (BACID) TABS tablet, Take 1 tablet by mouth daily as needed (digestive health.)., Disp: , Rfl:    Lancets Misc. KIT, Use to check blood sugar twice daily, Disp: , Rfl:    levocetirizine (XYZAL) 5 MG tablet, TAKE 1 TABLET BY MOUTH ONCE DAILY IN THE EVENING (Patient taking differently: Take 1/2 tablet (2.5 mg) daily in the evening as needed for allergies), Disp: 90 tablet, Rfl: 3   losartan (COZAAR) 50 MG tablet, Take 1 tablet by mouth once daily, Disp: 90 tablet, Rfl: 3   magnesium oxide (MAG-OX) 400 MG tablet, Take 400 mg by mouth daily., Disp: , Rfl:    mupirocin  ointment (BACTROBAN) 2 %, Apply 1 Application topically 2 (two) times daily. (Patient taking differently: Apply 1 Application topically 2 (two) times daily as needed.), Disp: 22 g, Rfl: 3   simvastatin (ZOCOR) 20 MG tablet, TAKE 1 TABLET BY MOUTH ONCE DAILY AT 6 PM, Disp: 90 tablet, Rfl: 1   TRULICITY 4.09 WJ/1.9JY SOPN, Inject 0.75 mg into the skin once a week., Disp: 12 mL, Rfl: 1  Cognitive Assessment Identity Confirmed: : Name; DOB Cognitive Status: Normal   Functional Assessment Hearing Difficulty or Deaf: no Wear Glasses or Blind: yes Vision Management: wears glasses Concentrating, Remembering or Making Decisions Difficulty (CP): no Difficulty Communicating: no Difficulty Eating/Swallowing: no Walking or Climbing Stairs Difficulty: yes Walking or Climbing Stairs: ambulation difficulty, requires equipment Mobility Management: uses walker when ambulating Dressing/Bathing Difficulty: no Doing Errands Independently Difficulty (such as shopping) (CP): yes Errands Management: daughter assist if needed with her errands, daughter takes her to her appointments Change in Functional Status Since Onset of Current Illness/Injury: no  Caregiver Assessment  Primary Source of Support/Comfort: child(ren) Name of Support/Comfort Primary Source: [place] in Home: child(ren), adult Name(s) of People in Home: [place] if Needed: child(ren), adult Family Caregiver Names: [name] is there to assist if needed Primary Roles/Responsibilities: retired   Planned Interventions  Provided education to patient about basic DM disease process. Review of goals of A1C. Patient with elevation from last A1C. ; Reviewed medications with patient and discussed importance of medication adherence. The patient is compliant with medications. Also works with pharm D for medications management ;        Reviewed prescribed diet with patient heart healthy/ADA diet. Review of dietary constraints  and the patient states it does get frustrating as she cannot eat the things she wants to eat but she is doing better than what she was with her eating.   ; Counseled on importance of regular laboratory monitoring as prescribed;        Discussed plans with patient for ongoing care management follow up and provided patient with direct contact information for care management team;      Provided patient with written educational materials related to hypo and hyperglycemia and importance of correct treatment. Denies any extreme highs or lows;       Reviewed scheduled/upcoming provider appointments including: Saw pcp on 05-01-2022, next appointment on 11-06-2022 Advised patient, providing education and rationale, to check cbg when you have symptoms of low or high blood sugar and as directed   and record. The patient keeps a record of her blood sugars. The patient states her blood sugar this am was 146. The patient says it was a little higher than normal. Review of goal of fasting blood sugars <130 and post prandial of <180. Education provided.        call provider for findings outside established parameters;       Referral made to pharmacy team for assistance with ongoing support and education for effective management of medications;       Review of patient status, including review of consultants reports, relevant laboratory and other test results, and medications completed;       Advised patient to discuss changes or concerns about DM and DM health and well being with provider;      Screening for signs and symptoms of depression related to chronic disease state;        Assessed social determinant of health barriers;        Basic overview and discussion of pathophysiology of Heart Failure reviewed. Review. The patient states she has gained weight and had a little more fluid on board. Education on monitoring for shifts in water weight and making sure she is watching for swelling and edema.  Provided education on  low sodium diet. Review of Heart healthy/ADA diet. The patient admits she has been eating a little more sodium than usual. Education to monitor for changes in water weight versus actual weight.  Reviewed Heart Failure Action Plan in depth and provided written copy Assessed need for readable accurate scales in home. The patient has scales and can safely weigh Provided education about placing scale on hard, flat surface Advised patient to weigh each morning after emptying bladder Discussed importance of daily weight and advised patient to weigh and record daily. The patient states her weight sometimes fluctuates between 232 and 237, it is up lately to 240's. The patient has been having to take her Lasix more consistently due to increased swelling and edema. Encouraged the patient  to monitor her sodium intake. She also is elevating her feet and legs. Education and support given Reviewed role of diuretics in prevention of fluid overload and management of heart failure. Is taking Lasix daily currently. Knows when she does not have swelling in her ankles to take only as needed. States she has been taking her Lasix daily the last few days due to swelling. Education and support. Discussed the importance of keeping all appointments with provider. Reminders provided. Provided patient with education about the role of exercise in the management of heart failure Advised patient to discuss changes in water weight, questions or concerns about HF or heart health with provider Screening for signs and symptoms of depression related to chronic disease state  Assessed social determinant of health barriers Evaluation of current treatment plan related to hypertension self management and patient's adherence to plan as established by provider. The patient states her blood pressures have been stable. Has had a little extra sodium lately and having some fluid on board. Education and support given;   Provided education to patient  re: stroke prevention, s/s of heart attack and stroke; Reviewed prescribed diet heart healthy/ADA diet. Review and discussed monitoring for hidden sodium and sugars in food and how to effectively balance food intake   Reviewed medications with patient and discussed importance of compliance. Is compliant with medications. Works with the [facility] D on a regular basis for effective management of medications;  Discussed plans with patient for ongoing care management follow up and provided patient with direct contact information for care management team; Advised patient, providing education and rationale, to monitor blood pressure daily and record, calling PCP for findings outside established parameters. The patient states that her blood pressures have been good at home. The patient does have know "white coat syndrome" Reviewed scheduled/upcoming provider appointments including: 11-06-2022. Saw pcp on 05-01-2022. Sees specialist on a regular basis. Advised patient to discuss changes in blood pressures and heart health with provider; Provided education on prescribed diet heart healthy/ADA diet. Education and review. Will send email information for healthy planning for meals ;  Discussed complications of poorly controlled blood pressure such as heart disease, stroke, circulatory complications, vision complications, kidney impairment, sexual dysfunction;  Screening for signs and symptoms of depression related to chronic disease state;  Assessed social determinant of health barriers   Interaction and coordination with outside resources, practitioners, and providers See CCM Referral  Care Plan: Available in MyChart

## 2022-05-13 NOTE — Chronic Care Management (AMB) (Signed)
Chronic Care Management   CCM RN Visit Note  05/13/2022 Name: Diana Harmon MRN: [id number] DOB: 05/05/1946  Subjective: Diana Harmon is a 77 y.o. year old female who is a primary care patient of [name], DO. The patient was referred to the Chronic Care Management team for assistance with care management needs subsequent to provider initiation of CCM services and plan of care.    Today's Visit:  Engaged with patient by telephone for follow up visit.     SDOH Interventions Today    Flowsheet Row Most Recent Value  SDOH Interventions   Food Insecurity Interventions Intervention Not Indicated  Housing Interventions Intervention Not Indicated  Transportation Interventions Intervention Not Indicated  Utilities Interventions Intervention Not Indicated  Alcohol Usage Interventions Intervention Not Indicated (Score <7)  Financial Strain Interventions Intervention Not Indicated  Physical Activity Interventions Intervention Not Indicated  Stress Interventions Intervention Not Indicated  Social Connections Interventions Intervention Not Indicated         Goals Addressed             This Visit's Progress    CCM Expected Outcome:  Monitor, Self-Manage and Reduce Symptoms of Diabetes       Current Barriers:  Chronic Disease Management support and education needs related to effective management of DM Lab Results  Component Value Date   HGBA1C 6.7 (H) 04/24/2022     Planned Interventions: Provided education to patient about basic DM disease process. Review of goals of A1C. Patient with elevation from last A1C. ; Reviewed medications with patient and discussed importance of medication adherence. The patient is compliant with medications. Also works with pharm D for medications management ;        Reviewed prescribed diet with patient heart healthy/ADA diet. Review of dietary constraints and the patient states it does get frustrating as she cannot eat  the things she wants to eat but she is doing better than what she was with her eating.   ; Counseled on importance of regular laboratory monitoring as prescribed;        Discussed plans with patient for ongoing care management follow up and provided patient with direct contact information for care management team;      Provided patient with written educational materials related to hypo and hyperglycemia and importance of correct treatment. Denies any extreme highs or lows;       Reviewed scheduled/upcoming provider appointments including: Saw pcp on 05-01-2022, next appointment on 11-06-2022 Advised patient, providing education and rationale, to check cbg when you have symptoms of low or high blood sugar and as directed   and record. The patient keeps a record of her blood sugars. The patient states her blood sugar this am was 146. The patient says it was a little higher than normal. Review of goal of fasting blood sugars <130 and post prandial of <180. Education provided.        call provider for findings outside established parameters;       Referral made to pharmacy team for assistance with ongoing support and education for effective management of medications;       Review of patient status, including review of consultants reports, relevant laboratory and other test results, and medications completed;       Advised patient to discuss changes or concerns about DM and DM health and well being with provider;      Screening for signs and symptoms of depression related to chronic disease state;  Assessed social determinant of health barriers;         Symptom Management: Take medications as prescribed   Attend all scheduled provider appointments Call provider office for new concerns or questions  call the Suicide and Crisis Lifeline: 988 call the [place] National Suicide Prevention Lifeline: 564-105-5956 or TTY: 778-551-2313 TTY (910) 670-6720) to talk to a trained counselor call 1-800-273-TALK  (toll free, 24 hour hotline) if experiencing a Mental Health or [place]  schedule appointment with eye doctor check feet daily for cuts, sores or redness trim toenails straight across wash and dry feet carefully every day wear comfortable, cotton socks wear comfortable, well-fitting shoes  Follow Up Plan: Telephone follow up appointment with care management team member scheduled for: 07-08-2022 at 1 pm       CCM Expected Outcome:  Monitor, Self-Manage and Reduce Symptoms of Heart Failure       Current Barriers:  Knowledge Deficits related to changes in fluid balance with CHF  Chronic Disease Management support and education needs related to effective management of CHF Wt Readings from Last 3 Encounters:  05/01/22 243 lb 9.6 oz (110.5 kg)  01/11/22 238 lb 1.6 oz (108 kg)  11/15/21 230 lb (104.3 kg)     Planned Interventions: Basic overview and discussion of pathophysiology of Heart Failure reviewed. Review. The patient states she has gained weight and had a little more fluid on board. Education on monitoring for shifts in water weight and making sure she is watching for swelling and edema.  Provided education on low sodium diet. Review of Heart healthy/ADA diet. The patient admits she has been eating a little more sodium than usual. Education to monitor for changes in water weight versus actual weight.  Reviewed Heart Failure Action Plan in depth and provided written copy Assessed need for readable accurate scales in home. The patient has scales and can safely weigh Provided education about placing scale on hard, flat surface Advised patient to weigh each morning after emptying bladder Discussed importance of daily weight and advised patient to weigh and record daily. The patient states her weight sometimes fluctuates between 232 and 237, it is up lately to 240's. The patient has been having to take her Lasix more consistently due to increased swelling and edema. Encouraged  the patient to monitor her sodium intake. She also is elevating her feet and legs. Education and support given Reviewed role of diuretics in prevention of fluid overload and management of heart failure. Is taking Lasix daily currently. Knows when she does not have swelling in her ankles to take only as needed. States she has been taking her Lasix daily the last few days due to swelling. Education and support. Discussed the importance of keeping all appointments with provider. Reminders provided. Provided patient with education about the role of exercise in the management of heart failure Advised patient to discuss changes in water weight, questions or concerns about HF or heart health with provider Screening for signs and symptoms of depression related to chronic disease state  Assessed social determinant of health barriers   Symptom Management: Take medications as prescribed   Attend all scheduled provider appointments Call pharmacy for medication refills 3-7 days in advance of running out of medications Call provider office for new concerns or questions  call the Suicide and Crisis Lifeline: 988 call the [place] National Suicide Prevention Lifeline: 920-719-9110 or TTY: (854)335-1054 TTY [phone]) to talk to a trained counselor call 1-800-273-TALK (toll free, 24 hour hotline) if experiencing a Mental  Health or [place]  call office if I gain more than 2 pounds in one day or 5 pounds in one week keep legs up while sitting track weight in diary use salt in moderation watch for swelling in feet, ankles and legs every day weigh myself daily develop a rescue plan follow rescue plan if symptoms flare-up track symptoms and what helps feel better or worse dress right for the weather, hot or cold  Follow Up Plan: Telephone follow up appointment with care management team member scheduled for: 07-08-2022 at 1 pm       CCM Expected Outcome:  Monitor, Self-Manage, and Reduce  Symptoms of Hypertension       Current Barriers:  Chronic Disease Management support and education needs related to effective management of HTN  BP Readings from Last 3 Encounters:  05/01/22 138/68  01/11/22 (!) 127/55  11/15/21 (!) 142/72    Planned Interventions: Evaluation of current treatment plan related to hypertension self management and patient's adherence to plan as established by provider. The patient states her blood pressures have been stable. Has had a little extra sodium lately and having some fluid on board. Education and support given;   Provided education to patient re: stroke prevention, s/s of heart attack and stroke; Reviewed prescribed diet heart healthy/ADA diet. Review and discussed monitoring for hidden sodium and sugars in food and how to effectively balance food intake   Reviewed medications with patient and discussed importance of compliance. Is compliant with medications. Works with the [place] D on a regular basis for effective management of medications;  Discussed plans with patient for ongoing care management follow up and provided patient with direct contact information for care management team; Advised patient, providing education and rationale, to monitor blood pressure daily and record, calling PCP for findings outside established parameters. The patient states that her blood pressures have been good at home. The patient does have know "white coat syndrome" Reviewed scheduled/upcoming provider appointments including: 11-06-2022. Saw pcp on 05-01-2022. Sees specialist on a regular basis. Advised patient to discuss changes in blood pressures and heart health with provider; Provided education on prescribed diet heart healthy/ADA diet. Education and review. Will send email information for healthy planning for meals ;  Discussed complications of poorly controlled blood pressure such as heart disease, stroke, circulatory complications, vision complications, kidney  impairment, sexual dysfunction;  Screening for signs and symptoms of depression related to chronic disease state;  Assessed social determinant of health barriers;   Symptom Management: Take medications as prescribed   Attend all scheduled provider appointments Call provider office for new concerns or questions  call the Suicide and Crisis Lifeline: 988 call the [place] National Suicide Prevention Lifeline: 754-298-9509 or TTY: 520 126 1191 TTY (812)282-6632) to talk to a trained counselor call 1-800-273-TALK (toll free, 24 hour hotline) if experiencing a Mental Health or [place]  check blood pressure 3 times per week choose a place to take my blood pressure (home, clinic or office, retail store) write blood pressure results in a log or diary learn about high blood pressure keep a blood pressure log take blood pressure log to all doctor appointments call doctor for signs and symptoms of high blood pressure develop an action plan for high blood pressure keep all doctor appointments take medications for blood pressure exactly as prescribed report new symptoms to your doctor  Follow Up Plan: Telephone follow up appointment with care management team member scheduled for:07-08-2022 at 1 pm  Plan:Telephone follow up appointment with care management team member scheduled for:  07-08-2022 at 1 pm  [place], MSN, CCM RN Care Manager  Chronic Care Management Direct Number: [phone]

## 2022-05-13 NOTE — Patient Instructions (Signed)
Please call the care guide team at [phone] if you need to cancel or reschedule your appointment.   If you are experiencing a Mental Health or [place] or need someone to talk to, please call the Suicide and Crisis Lifeline: 988 call the [place] National Suicide Prevention Lifeline: 732-346-3394 or TTY: 914-096-7127 TTY (607) 171-2803) to talk to a trained counselor call 1-800-273-TALK (toll free, 24 hour hotline)   Following is a copy of the CCM Program Consent:  CCM service includes personalized support from designated clinical staff supervised by the physician, including individualized plan of care and coordination with other care providers 24/7 contact phone numbers for assistance for urgent and routine care needs. Service will only be billed when office clinical staff spend 20 minutes or more in a month to coordinate care. Only one practitioner may furnish and bill the service in a calendar month. The patient may stop CCM services at amy time (effective at the end of the month) by phone call to the office staff. The patient will be responsible for cost sharing (co-pay) or up to 20% of the service fee (after annual deductible is met)  Following is a copy of your full provider care plan:   Goals Addressed             This Visit's Progress    CCM Expected Outcome:  Monitor, Self-Manage and Reduce Symptoms of Diabetes       Current Barriers:  Chronic Disease Management support and education needs related to effective management of DM Lab Results  Component Value Date   HGBA1C 6.7 (H) 04/24/2022     Planned Interventions: Provided education to patient about basic DM disease process. Review of goals of A1C. Patient with elevation from last A1C. ; Reviewed medications with patient and discussed importance of medication adherence. The patient is compliant with medications. Also works with pharm D for medications management ;        Reviewed prescribed diet with patient  heart healthy/ADA diet. Review of dietary constraints and the patient states it does get frustrating as she cannot eat the things she wants to eat but she is doing better than what she was with her eating.   ; Counseled on importance of regular laboratory monitoring as prescribed;        Discussed plans with patient for ongoing care management follow up and provided patient with direct contact information for care management team;      Provided patient with written educational materials related to hypo and hyperglycemia and importance of correct treatment. Denies any extreme highs or lows;       Reviewed scheduled/upcoming provider appointments including: Saw pcp on 05-01-2022, next appointment on 11-06-2022 Advised patient, providing education and rationale, to check cbg when you have symptoms of low or high blood sugar and as directed   and record. The patient keeps a record of her blood sugars. The patient states her blood sugar this am was 146. The patient says it was a little higher than normal. Review of goal of fasting blood sugars <130 and post prandial of <180. Education provided.        call provider for findings outside established parameters;       Referral made to pharmacy team for assistance with ongoing support and education for effective management of medications;       Review of patient status, including review of consultants reports, relevant laboratory and other test results, and medications completed;       Advised  patient to discuss changes or concerns about DM and DM health and well being with provider;      Screening for signs and symptoms of depression related to chronic disease state;        Assessed social determinant of health barriers;         Symptom Management: Take medications as prescribed   Attend all scheduled provider appointments Call provider office for new concerns or questions  call the Suicide and Crisis Lifeline: 988 call the [place] National Suicide Prevention  Lifeline: 469-396-5199 or TTY: 616-695-7035 TTY (207)595-3007) to talk to a trained counselor call 1-800-273-TALK (toll free, 24 hour hotline) if experiencing a Mental Health or [place]  schedule appointment with eye doctor check feet daily for cuts, sores or redness trim toenails straight across wash and dry feet carefully every day wear comfortable, cotton socks wear comfortable, well-fitting shoes  Follow Up Plan: Telephone follow up appointment with care management team member scheduled for: 07-08-2022 at 1 pm       CCM Expected Outcome:  Monitor, Self-Manage and Reduce Symptoms of Heart Failure       Current Barriers:  Knowledge Deficits related to changes in fluid balance with CHF  Chronic Disease Management support and education needs related to effective management of CHF Wt Readings from Last 3 Encounters:  05/01/22 243 lb 9.6 oz (110.5 kg)  01/11/22 238 lb 1.6 oz (108 kg)  11/15/21 230 lb (104.3 kg)     Planned Interventions: Basic overview and discussion of pathophysiology of Heart Failure reviewed. Review. The patient states she has gained weight and had a little more fluid on board. Education on monitoring for shifts in water weight and making sure she is watching for swelling and edema.  Provided education on low sodium diet. Review of Heart healthy/ADA diet. The patient admits she has been eating a little more sodium than usual. Education to monitor for changes in water weight versus actual weight.  Reviewed Heart Failure Action Plan in depth and provided written copy Assessed need for readable accurate scales in home. The patient has scales and can safely weigh Provided education about placing scale on hard, flat surface Advised patient to weigh each morning after emptying bladder Discussed importance of daily weight and advised patient to weigh and record daily. The patient states her weight sometimes fluctuates between 232 and 237, it is up lately to  240's. The patient has been having to take her Lasix more consistently due to increased swelling and edema. Encouraged the patient to monitor her sodium intake. She also is elevating her feet and legs. Education and support given Reviewed role of diuretics in prevention of fluid overload and management of heart failure. Is taking Lasix daily currently. Knows when she does not have swelling in her ankles to take only as needed. States she has been taking her Lasix daily the last few days due to swelling. Education and support. Discussed the importance of keeping all appointments with provider. Reminders provided. Provided patient with education about the role of exercise in the management of heart failure Advised patient to discuss changes in water weight, questions or concerns about HF or heart health with provider Screening for signs and symptoms of depression related to chronic disease state  Assessed social determinant of health barriers   Symptom Management: Take medications as prescribed   Attend all scheduled provider appointments Call pharmacy for medication refills 3-7 days in advance of running out of medications Call provider office for new concerns  or questions  call the Suicide and Crisis Lifeline: 988 call the [place] National Suicide Prevention Lifeline: 6131496012 or TTY: (210)450-4569 TTY 403-304-4814) to talk to a trained counselor call 1-800-273-TALK (toll free, 24 hour hotline) if experiencing a Mental Health or [place]  call office if I gain more than 2 pounds in one day or 5 pounds in one week keep legs up while sitting track weight in diary use salt in moderation watch for swelling in feet, ankles and legs every day weigh myself daily develop a rescue plan follow rescue plan if symptoms flare-up track symptoms and what helps feel better or worse dress right for the weather, hot or cold  Follow Up Plan: Telephone follow up appointment with care  management team member scheduled for: 07-08-2022 at 1 pm       CCM Expected Outcome:  Monitor, Self-Manage, and Reduce Symptoms of Hypertension       Current Barriers:  Chronic Disease Management support and education needs related to effective management of HTN  BP Readings from Last 3 Encounters:  05/01/22 138/68  01/11/22 (!) 127/55  11/15/21 (!) 142/72    Planned Interventions: Evaluation of current treatment plan related to hypertension self management and patient's adherence to plan as established by provider. The patient states her blood pressures have been stable. Has had a little extra sodium lately and having some fluid on board. Education and support given;   Provided education to patient re: stroke prevention, s/s of heart attack and stroke; Reviewed prescribed diet heart healthy/ADA diet. Review and discussed monitoring for hidden sodium and sugars in food and how to effectively balance food intake   Reviewed medications with patient and discussed importance of compliance. Is compliant with medications. Works with the [place] D on a regular basis for effective management of medications;  Discussed plans with patient for ongoing care management follow up and provided patient with direct contact information for care management team; Advised patient, providing education and rationale, to monitor blood pressure daily and record, calling PCP for findings outside established parameters. The patient states that her blood pressures have been good at home. The patient does have know "white coat syndrome" Reviewed scheduled/upcoming provider appointments including: 11-06-2022. Saw pcp on 05-01-2022. Sees specialist on a regular basis. Advised patient to discuss changes in blood pressures and heart health with provider; Provided education on prescribed diet heart healthy/ADA diet. Education and review. Will send email information for healthy planning for meals ;  Discussed complications of poorly  controlled blood pressure such as heart disease, stroke, circulatory complications, vision complications, kidney impairment, sexual dysfunction;  Screening for signs and symptoms of depression related to chronic disease state;  Assessed social determinant of health barriers;   Symptom Management: Take medications as prescribed   Attend all scheduled provider appointments Call provider office for new concerns or questions  call the Suicide and Crisis Lifeline: 988 call the [place] National Suicide Prevention Lifeline: 780-860-2844 or TTY: (626)879-1969 TTY (419)301-5753) to talk to a trained counselor call 1-800-273-TALK (toll free, 24 hour hotline) if experiencing a Mental Health or [place]  check blood pressure 3 times per week choose a place to take my blood pressure (home, clinic or office, retail store) write blood pressure results in a log or diary learn about high blood pressure keep a blood pressure log take blood pressure log to all doctor appointments call doctor for signs and symptoms of high blood pressure develop an action plan for high blood pressure keep  all doctor appointments take medications for blood pressure exactly as prescribed report new symptoms to your doctor  Follow Up Plan: Telephone follow up appointment with care management team member scheduled for:07-08-2022 at 1 pm          Patient verbalizes understanding of instructions and care plan provided today and agrees to view in [place]. Active MyChart status and patient understanding of how to access instructions and care plan via MyChart confirmed with patient.     Telephone follow up appointment with care management team member scheduled for: 07-08-2022 at 1 pm

## 2022-05-24 ENCOUNTER — Ambulatory Visit (INDEPENDENT_AMBULATORY_CARE_PROVIDER_SITE_OTHER): Payer: Medicare Other

## 2022-05-24 VITALS — Ht 60.0 in | Wt 243.0 lb

## 2022-05-24 DIAGNOSIS — Z0000 Encounter for general adult medical examination without abnormal findings: Secondary | ICD-10-CM

## 2022-05-24 NOTE — Patient Instructions (Signed)
Diana Harmon , Thank you for taking time to come for your Medicare Wellness Visit. I appreciate your ongoing commitment to your health goals. Please review the following plan we discussed and let me know if I can assist you in the future.   These are the goals we discussed:  Goals      CCM Expected Outcome:  Monitor, Self-Manage and Reduce Symptoms of Diabetes     Current Barriers:  Chronic Disease Management support and education needs related to effective management of DM Lab Results  Component Value Date   HGBA1C 6.7 (H) 04/24/2022     Planned Interventions: Provided education to patient about basic DM disease process. Review of goals of A1C. Patient with elevation from last A1C. ; Reviewed medications with patient and discussed importance of medication adherence. The patient is compliant with medications. Also works with pharm D for medications management ;        Reviewed prescribed diet with patient heart healthy/ADA diet. Review of dietary constraints and the patient states it does get frustrating as she cannot eat the things she wants to eat but she is doing better than what she was with her eating.   ; Counseled on importance of regular laboratory monitoring as prescribed;        Discussed plans with patient for ongoing care management follow up and provided patient with direct contact information for care management team;      Provided patient with written educational materials related to hypo and hyperglycemia and importance of correct treatment. Denies any extreme highs or lows;       Reviewed scheduled/upcoming provider appointments including: Saw pcp on 05-01-2022, next appointment on 11-06-2022 Advised patient, providing education and rationale, to check cbg when you have symptoms of low or high blood sugar and as directed   and record. The patient keeps a record of her blood sugars. The patient states her blood sugar this am was 146. The patient says it was a little higher than  normal. Review of goal of fasting blood sugars <130 and post prandial of <180. Education provided.        call provider for findings outside established parameters;       Referral made to pharmacy team for assistance with ongoing support and education for effective management of medications;       Review of patient status, including review of consultants reports, relevant laboratory and other test results, and medications completed;       Advised patient to discuss changes or concerns about DM and DM health and well being with provider;      Screening for signs and symptoms of depression related to chronic disease state;        Assessed social determinant of health barriers;         Symptom Management: Take medications as prescribed   Attend all scheduled provider appointments Call provider office for new concerns or questions  call the Suicide and Crisis Lifeline: 988 call the [place] National Suicide Prevention Lifeline: (414)715-8560 or TTY: 947-289-4094 TTY 323-242-2092) to talk to a trained counselor call 1-800-273-TALK (toll free, 24 hour hotline) if experiencing a Mental Health or [place]  schedule appointment with eye doctor check feet daily for cuts, sores or redness trim toenails straight across wash and dry feet carefully every day wear comfortable, cotton socks wear comfortable, well-fitting shoes  Follow Up Plan: Telephone follow up appointment with care management team member scheduled for: 07-08-2022 at 1 pm  CCM Expected Outcome:  Monitor, Self-Manage and Reduce Symptoms of Heart Failure     Current Barriers:  Knowledge Deficits related to changes in fluid balance with CHF  Chronic Disease Management support and education needs related to effective management of CHF Wt Readings from Last 3 Encounters:  05/01/22 243 lb 9.6 oz (110.5 kg)  01/11/22 238 lb 1.6 oz (108 kg)  11/15/21 230 lb (104.3 kg)     Planned Interventions: Basic overview and  discussion of pathophysiology of Heart Failure reviewed. Review. The patient states she has gained weight and had a little more fluid on board. Education on monitoring for shifts in water weight and making sure she is watching for swelling and edema.  Provided education on low sodium diet. Review of Heart healthy/ADA diet. The patient admits she has been eating a little more sodium than usual. Education to monitor for changes in water weight versus actual weight.  Reviewed Heart Failure Action Plan in depth and provided written copy Assessed need for readable accurate scales in home. The patient has scales and can safely weigh Provided education about placing scale on hard, flat surface Advised patient to weigh each morning after emptying bladder Discussed importance of daily weight and advised patient to weigh and record daily. The patient states her weight sometimes fluctuates between 232 and 237, it is up lately to 240's. The patient has been having to take her Lasix more consistently due to increased swelling and edema. Encouraged the patient to monitor her sodium intake. She also is elevating her feet and legs. Education and support given Reviewed role of diuretics in prevention of fluid overload and management of heart failure. Is taking Lasix daily currently. Knows when she does not have swelling in her ankles to take only as needed. States she has been taking her Lasix daily the last few days due to swelling. Education and support. Discussed the importance of keeping all appointments with provider. Reminders provided. Provided patient with education about the role of exercise in the management of heart failure Advised patient to discuss changes in water weight, questions or concerns about HF or heart health with provider Screening for signs and symptoms of depression related to chronic disease state  Assessed social determinant of health barriers   Symptom Management: Take medications as  prescribed   Attend all scheduled provider appointments Call pharmacy for medication refills 3-7 days in advance of running out of medications Call provider office for new concerns or questions  call the Suicide and Crisis Lifeline: 988 call the [place] National Suicide Prevention Lifeline: 587-365-5754 or TTY: 4131064643 TTY 581-627-4605) to talk to a trained counselor call 1-800-273-TALK (toll free, 24 hour hotline) if experiencing a Mental Health or [place]  call office if I gain more than 2 pounds in one day or 5 pounds in one week keep legs up while sitting track weight in diary use salt in moderation watch for swelling in feet, ankles and legs every day weigh myself daily develop a rescue plan follow rescue plan if symptoms flare-up track symptoms and what helps feel better or worse dress right for the weather, hot or cold  Follow Up Plan: Telephone follow up appointment with care management team member scheduled for: 07-08-2022 at 1 pm       CCM Expected Outcome:  Monitor, Self-Manage, and Reduce Symptoms of Hypertension     Current Barriers:  Chronic Disease Management support and education needs related to effective management of HTN  BP Readings from Last  3 Encounters:  05/01/22 138/68  01/11/22 (!) 127/55  11/15/21 (!) 142/72    Planned Interventions: Evaluation of current treatment plan related to hypertension self management and patient's adherence to plan as established by provider. The patient states her blood pressures have been stable. Has had a little extra sodium lately and having some fluid on board. Education and support given;   Provided education to patient re: stroke prevention, s/s of heart attack and stroke; Reviewed prescribed diet heart healthy/ADA diet. Review and discussed monitoring for hidden sodium and sugars in food and how to effectively balance food intake   Reviewed medications with patient and discussed importance of compliance.  Is compliant with medications. Works with the [facility] on a regular basis for effective management of medications;  Discussed plans with patient for ongoing care management follow up and provided patient with direct contact information for care management team; Advised patient, providing education and rationale, to monitor blood pressure daily and record, calling PCP for findings outside established parameters. The patient states that her blood pressures have been good at home. The patient does have know "white coat syndrome" Reviewed scheduled/upcoming provider appointments including: 11-06-2022. Saw pcp on 05-01-2022. Sees specialist on a regular basis. Advised patient to discuss changes in blood pressures and heart health with provider; Provided education on prescribed diet heart healthy/ADA diet. Education and review. Will send email information for healthy planning for meals ;  Discussed complications of poorly controlled blood pressure such as heart disease, stroke, circulatory complications, vision complications, kidney impairment, sexual dysfunction;  Screening for signs and symptoms of depression related to chronic disease state;  Assessed social determinant of health barriers;   Symptom Management: Take medications as prescribed   Attend all scheduled provider appointments Call provider office for new concerns or questions  call the Suicide and Crisis Lifeline: 988 call the [place] National Suicide Prevention Lifeline: 6466184554 or TTY: (216)068-5820 TTY 9725708782) to talk to a trained counselor call 1-800-273-TALK (toll free, 24 hour hotline) if experiencing a Mental Health or [place]  check blood pressure 3 times per week choose a place to take my blood pressure (home, clinic or office, retail store) write blood pressure results in a log or diary learn about high blood pressure keep a blood pressure log take blood pressure log to all doctor appointments call  doctor for signs and symptoms of high blood pressure develop an action plan for high blood pressure keep all doctor appointments take medications for blood pressure exactly as prescribed report new symptoms to your doctor  Follow Up Plan: Telephone follow up appointment with care management team member scheduled for:07-08-2022 at 1 pm       DIET - EAT Gray drinking at least 6-8 glasses of water a day      DIET - REDUCE SALT INTAKE TO 2 GRAMS PER DAY OR LESS     Drink more water     DIET - REDUCE SUGAR INTAKE     Recommend to cut out desserts in daily diet to help aid in weight loss and control diabetes.      Patient Stated     04/25/2019, wants to lose weight (190-195)     Pharmacy Goals     Our goal A1c is less than 7%. This corresponds with fasting sugars less than 130 and 2 hour after meal sugars less than 180. Please keep a log  of your results when checking your blood sugar  Our goal bad cholesterol, or LDL, is less than 70 . This is why it is important to continue taking your simvastatin  Please check your home blood pressure, keep a log of the results and bring this with you to your medical appointments.  Feel free to call me with any questions or concerns. I look forward to our next call!   Wallace Cullens, PharmD, [place], CPP Clinical Pharmacist [facility] [phone]         This is a list of the screening recommended for you and due dates:  Health Maintenance  Topic Date Due   DTaP/Tdap/Td vaccine (1 - Tdap) Never done   COVID-19 Vaccine (4 - 2023-24 season) 01/04/2022   Eye exam for diabetics  10/23/2022   Hemoglobin A1C  10/24/2022   Yearly kidney function blood test for diabetes  04/25/2023   Yearly kidney health urinalysis for diabetes  05/02/2023   Complete foot exam   05/02/2023   Medicare Annual Wellness Visit  05/25/2023   DEXA scan (bone density  measurement)  05/18/2024   Colon Cancer Screening  03/12/2025   Pneumonia Vaccine  Completed   Flu Shot  Completed   Hepatitis C Screening: USPSTF Recommendation to screen - Ages 18-79 yo.  Completed   Zoster (Shingles) Vaccine  Completed   HPV Vaccine  Aged Out    Advanced directives: yes  Conditions/risks identified: no  Next appointment: Follow up in one year for your annual wellness visit 05/30/23 @ 11:15 am by phone   Preventive Care 65 Years and Older, Female Preventive care refers to lifestyle choices and visits with your health care provider that can promote health and wellness. What does preventive care include? A yearly physical exam. This is also called an annual well check. Dental exams once or twice a year. Routine eye exams. Ask your health care provider how often you should have your eyes checked. Personal lifestyle choices, including: Daily care of your teeth and gums. Regular physical activity. Eating a healthy diet. Avoiding tobacco and drug use. Limiting alcohol use. Practicing safe sex. Taking low-dose aspirin every day. Taking vitamin and mineral supplements as recommended by your health care provider. What happens during an annual well check? The services and screenings done by your health care provider during your annual well check will depend on your age, overall health, lifestyle risk factors, and family history of disease. Counseling  Your health care provider may ask you questions about your: Alcohol use. Tobacco use. Drug use. Emotional well-being. Home and relationship well-being. Sexual activity. Eating habits. History of falls. Memory and ability to understand (cognition). Work and work Statistician. Reproductive health. Screening  You may have the following tests or measurements: Height, weight, and BMI. Blood pressure. Lipid and cholesterol levels. These may be checked every 5 years, or more frequently if you are over 69 years  old. Skin check. Lung cancer screening. You may have this screening every year starting at age 65 if you have a 30-pack-year history of smoking and currently smoke or have quit within the past 15 years. Fecal occult blood test (FOBT) of the stool. You may have this test every year starting at age 31. Flexible sigmoidoscopy or colonoscopy. You may have a sigmoidoscopy every 5 years or a colonoscopy every 10 years starting at age 87. Hepatitis C blood test. Hepatitis B blood test. Sexually transmitted disease (STD) testing. Diabetes screening. This is done by checking your  blood sugar (glucose) after you have not eaten for a while (fasting). You may have this done every 1-3 years. Bone density scan. This is done to screen for osteoporosis. You may have this done starting at age 6. Mammogram. This may be done every 1-2 years. Talk to your health care provider about how often you should have regular mammograms. Talk with your health care provider about your test results, treatment options, and if necessary, the need for more tests. Vaccines  Your health care provider may recommend certain vaccines, such as: Influenza vaccine. This is recommended every year. Tetanus, diphtheria, and acellular pertussis (Tdap, Td) vaccine. You may need a Td booster every 10 years. Zoster vaccine. You may need this after age 85. Pneumococcal 13-valent conjugate (PCV13) vaccine. One dose is recommended after age 33. Pneumococcal polysaccharide (PPSV23) vaccine. One dose is recommended after age 80. Talk to your health care provider about which screenings and vaccines you need and how often you need them. This information is not intended to replace advice given to you by your health care provider. Make sure you discuss any questions you have with your health care provider. Document Released: 05/19/2015 Document Revised: 01/10/2016 Document Reviewed: 02/21/2015 Elsevier Interactive Patient Education  2017 [place] Prevention in the Home Falls can cause injuries. They can happen to people of all ages. There are many things you can do to make your home safe and to help prevent falls. What can I do on the outside of my home? Regularly fix the edges of walkways and driveways and fix any cracks. Remove anything that might make you trip as you walk through a door, such as a raised step or threshold. Trim any bushes or trees on the path to your home. Use bright outdoor lighting. Clear any walking paths of anything that might make someone trip, such as rocks or tools. Regularly check to see if handrails are loose or broken. Make sure that both sides of any steps have handrails. Any raised decks and porches should have guardrails on the edges. Have any leaves, snow, or ice cleared regularly. Use sand or salt on walking paths during winter. Clean up any spills in your garage right away. This includes oil or grease spills. What can I do in the bathroom? Use night lights. Install grab bars by the toilet and in the tub and shower. Do not use towel bars as grab bars. Use non-skid mats or decals in the tub or shower. If you need to sit down in the shower, use a plastic, non-slip stool. Keep the floor dry. Clean up any water that spills on the floor as soon as it happens. Remove soap buildup in the tub or shower regularly. Attach bath mats securely with double-sided non-slip rug tape. Do not have throw rugs and other things on the floor that can make you trip. What can I do in the bedroom? Use night lights. Make sure that you have a light by your bed that is easy to reach. Do not use any sheets or blankets that are too big for your bed. They should not hang down onto the floor. Have a firm chair that has side arms. You can use this for support while you get dressed. Do not have throw rugs and other things on the floor that can make you trip. What can I do in the kitchen? Clean up any spills right  away. Avoid walking on wet floors. Keep items that you use a lot in  easy-to-reach places. If you need to reach something above you, use a strong step stool that has a grab bar. Keep electrical cords out of the way. Do not use floor polish or wax that makes floors slippery. If you must use wax, use non-skid floor wax. Do not have throw rugs and other things on the floor that can make you trip. What can I do with my stairs? Do not leave any items on the stairs. Make sure that there are handrails on both sides of the stairs and use them. Fix handrails that are broken or loose. Make sure that handrails are as long as the stairways. Check any carpeting to make sure that it is firmly attached to the stairs. Fix any carpet that is loose or worn. Avoid having throw rugs at the top or bottom of the stairs. If you do have throw rugs, attach them to the floor with carpet tape. Make sure that you have a light switch at the top of the stairs and the bottom of the stairs. If you do not have them, ask someone to add them for you. What else can I do to help prevent falls? Wear shoes that: Do not have high heels. Have rubber bottoms. Are comfortable and fit you well. Are closed at the toe. Do not wear sandals. If you use a stepladder: Make sure that it is fully opened. Do not climb a closed stepladder. Make sure that both sides of the stepladder are locked into place. Ask someone to hold it for you, if possible. Clearly mark and make sure that you can see: Any grab bars or handrails. First and last steps. Where the edge of each step is. Use tools that help you move around (mobility aids) if they are needed. These include: Canes. Walkers. Scooters. Crutches. Turn on the lights when you go into a dark area. Replace any light bulbs as soon as they burn out. Set up your furniture so you have a clear path. Avoid moving your furniture around. If any of your floors are uneven, fix them. If there are any  pets around you, be aware of where they are. Review your medicines with your doctor. Some medicines can make you feel dizzy. This can increase your chance of falling. Ask your doctor what other things that you can do to help prevent falls. This information is not intended to replace advice given to you by your health care provider. Make sure you discuss any questions you have with your health care provider. Document Released: 02/16/2009 Document Revised: 09/28/2015 Document Reviewed: 05/27/2014 Elsevier Interactive Patient Education  2017 [employer].

## 2022-05-24 NOTE — Progress Notes (Signed)
Virtual Visit via Telephone Note  I connected with  Krisalyn Yankowski [place] on 05/24/22 at 10:30 AM EST by telephone and verified that I am speaking with the correct person using two identifiers.  Location: Patient: home Provider: [facility] Persons participating in the virtual visit: [place]   I discussed the limitations, risks, security and privacy concerns of performing an evaluation and management service by telephone and the availability of in person appointments. The patient expressed understanding and agreed to proceed.  Interactive audio and video telecommunications were attempted between this nurse and patient, however failed, due to patient having technical difficulties OR patient did not have access to video capability.  We continued and completed visit with audio only.  Some vital signs may be absent or patient reported.   Dionisio David, LPN  Subjective:   Anahlia Iseminger is a 77 y.o. female who presents for Medicare Annual (Subsequent) preventive examination.  Review of Systems     Cardiac Risk Factors include: advanced age (>10mn, >>87women);diabetes mellitus;dyslipidemia;hypertension;sedentary lifestyle     Objective:    Today's Vitals   05/24/22 1026  PainSc: 5    There is no height or weight on file to calculate BMI.     05/24/2022   10:35 AM 01/11/2022   11:03 AM 11/15/2021   10:19 AM 07/12/2021    9:44 AM 05/18/2021   10:36 AM 01/11/2021   10:46 AM 07/13/2020   10:47 AM  Advanced Directives  Does Patient Have a Medical Advance Directive? Yes Yes Yes Yes Yes Yes Yes  Type of AParamedicof AColquittLiving will HM[place]Living will HGrosse Pointe ParkLiving will HPleasantsLiving will HVilla RidgeLiving will Healthcare Power of ATunica ResortsLiving will  Does patient want to make changes to medical advance directive? No -  Patient declined    Yes (Inpatient - patient defers changing a medical advance directive and declines information at this time)  No - Patient declined  Copy of H[place]ill[place]in Chart? Yes - validated most recent copy scanned in chart (See row information)    Yes - validated most recent copy scanned in chart (See row information) No - copy requested Yes - validated most recent copy scanned in chart (See row information)  Would patient like information on creating a medical advance directive?      No - Patient declined No - Patient declined    Current Medications (verified) Outpatient Encounter Medications as of 05/24/2022  Medication Sig   ACCU-CHEK GUIDE test strip USE TO CHECK BLOOD SUGAR TWICE DAILY   Accu-Chek Softclix Lancets lancets USE TO CHECK GLUCOSE TWICE DAILY   acetaminophen (TYLENOL) 500 MG tablet Take 1,000 mg by mouth every 6 (six) hours as needed for moderate pain.    Artificial Tear Solution (GENTEAL TEARS) 0.1-0.2-0.3 % SOLN Place 1 drop into both eyes 3 (three) times daily as needed (dry/irritated eyes.).   aspirin 81 MG chewable tablet Chew 1 tablet (81 mg total) by mouth daily.   B COMPLEX-C PO Take 10 mLs by mouth daily.   baclofen (LIORESAL) 10 MG tablet TAKE 1 TO 2 TABLETS BY MOUTH TWICE DAILY AS NEEDED FOR MUSCLE SPASM   Blood Glucose Monitoring Suppl (ACCU-CHEK GUIDE) w/Device KIT Use to check blood sugar twice per day   Cholecalciferol (VITAMIN D3) 125 MCG (5000 UT) TABS Take 5,000 Units by mouth daily.   Ferrous Sulfate (IRON) 325 (65 Fe) MG TABS  TAKE 1 TABLET BY MOUTH TWICE DAILY WITH A MEAL   furosemide (LASIX) 20 MG tablet Take 1 tablet by mouth once daily (Patient taking differently: Take 20 mg by mouth daily as needed.)   hydrochlorothiazide (HYDRODIURIL) 25 MG tablet Take 1 tablet by mouth once daily   hydroxychloroquine (PLAQUENIL) 200 MG tablet Take 1 tablet by mouth 2 (two) times daily.   Lancets Misc. KIT Use to check blood sugar twice daily    levocetirizine (XYZAL) 5 MG tablet TAKE 1 TABLET BY MOUTH ONCE DAILY IN THE EVENING (Patient taking differently: Take 1/2 tablet (2.5 mg) daily in the evening as needed for allergies)   losartan (COZAAR) 50 MG tablet Take 1 tablet by mouth once daily   magnesium oxide (MAG-OX) 400 MG tablet Take 400 mg by mouth daily.   mupirocin ointment (BACTROBAN) 2 % Apply 1 Application topically 2 (two) times daily. (Patient taking differently: Apply 1 Application topically 2 (two) times daily as needed.)   simvastatin (ZOCOR) 20 MG tablet TAKE 1 TABLET BY MOUTH ONCE DAILY AT 6 PM   TRULICITY 1.01 BP/1.0CH SOPN Inject 0.75 mg into the skin once a week.   cyanocobalamin 1000 MCG tablet Take by mouth every other day.   lactobacillus acidophilus (BACID) TABS tablet Take 1 tablet by mouth daily as needed (digestive health.). (Patient not taking: Reported on 05/24/2022)   [DISCONTINUED] Cromolyn Sodium (NASAL ALLERGY NA) Place 1 spray into the nose daily as needed (allergies).   No facility-administered encounter medications on file as of 05/24/2022.    Allergies (verified) Ferumoxytol, Penicillins, Iron, and Other   History: Past Medical History:  Diagnosis Date   Anemia    Arthritis    legs, knees, back, wrists   Diabetes mellitus without complication (HCC)    Hypertension    Nausea    Patient is [name]    S/P TAVR (transcatheter aortic valve replacement) 11/09/2019   s/p TAVR with a 26 mm Edwards S3U via the TF approach by Dr. Angelena Form & Bartle   Severe aortic stenosis    SIRS (systemic inflammatory response syndrome) ([place]) 11/2019   Sjogren's disease (No Name)    Thyroid disease    Past Surgical History:  Procedure Laterality Date   CARDIAC CATHETERIZATION  10/19/2019   COLON SURGERY     caudarized polyps; rectal carcinoid excision ~ 2011   COLONOSCOPY     COLONOSCOPY WITH PROPOFOL N/A 03/13/2015   Procedure: COLONOSCOPY WITH PROPOFOL;  Surgeon: Manya Silvas, MD;  Location: [place];  Service: Endoscopy;  Laterality: N/A;   ESOPHAGOGASTRODUODENOSCOPY N/A 03/13/2015   Procedure: ESOPHAGOGASTRODUODENOSCOPY (EGD);  Surgeon: Manya Silvas, MD;  [facility] ENDOSCOPY;  Service: Endoscopy;  Laterality: N/A;   INTRAOPERATIVE TRANSTHORACIC ECHOCARDIOGRAM N/A 11/09/2019   Procedure: INTRAOPERATIVE TRANSTHORACIC ECHOCARDIOGRAM;  Surgeon: Burnell Blanks, MD;  Location: [place];  Service: Open Heart Surgery;  Laterality: N/A;   PARATHYROIDECTOMY  11/20/2004   right superior parathyroidecomy for primary hyperparathyroidism/parathyroid adenoma   RIGHT/LEFT HEART CATH AND CORONARY ANGIOGRAPHY N/A 10/19/2019   Procedure: RIGHT/LEFT HEART CATH AND CORONARY ANGIOGRAPHY;  Surgeon: Nelva Bush, MD;  Location: [place] [facility];  Service: Cardiovascular;  Laterality: N/A;   THYROIDECTOMY     TRANSCATHETER AORTIC VALVE REPLACEMENT, TRANSFEMORAL N/A 11/09/2019   Procedure: TRANSCATHETER AORTIC VALVE REPLACEMENT, TRANSFEMORAL;  Surgeon: Burnell Blanks, MD;  Location: [place];  Service: Open Heart Surgery;  Laterality: N/A;   Family History  Problem Relation Age of Onset   Parkinson's disease Mother  Heart disease Father    Lupus Sister    Aneurysm Son    Lupus Son    Breast cancer Maternal Aunt        mat great aunt   Social History   Socioeconomic History   Marital status: Divorced    Spouse name: Not on file   Number of children: 2   Years of education: 12   Highest education level: 12th grade  Occupational History   Occupation: retired-worked with nandicapped adults  Tobacco Use   Smoking status: Former    Years: 20.00    Types: Cigarettes    Quit date: 09/05/1979    Years since quitting: 42.7   Smokeless tobacco: Former  Scientific laboratory technician Use: Never used  Substance and Sexual Activity   Alcohol use: Yes    Alcohol/week: 0.0 - 1.0 standard drinks of alcohol    Comment: occ. wine   Drug use: No   Sexual activity: Not on file  Other  Topics Concern   Not on file  Social History Narrative   Not on file   Social Determinants of Health   Financial Resource Strain: Low Risk  (05/24/2022)   Overall Financial Resource Strain (CARDIA)    Difficulty of Paying Living Expenses: Not hard at all  Food Insecurity: No Food Insecurity (05/24/2022)   Hunger Vital Sign    Worried About Running Out of Food in the Last Year: Never true    Ran Out of Food in the Last Year: Never true  Transportation Needs: No Transportation Needs (05/24/2022)   PRAPARE - Hydrologist (Medical): No    Lack of Transportation (Non-Medical): No  Physical Activity: Insufficiently Active (05/24/2022)   Exercise Vital Sign    Days of Exercise per Week: 7 days    Minutes of Exercise per Session: 10 min  Stress: No Stress Concern Present (05/24/2022)   [place]    Feeling of Stress : Not at all  Social Connections: Socially Isolated (05/24/2022)   Social Connection and Isolation Panel [NHANES]    Frequency of Communication with Friends and Family: Twice a week    Frequency of Social Gatherings with Friends and Family: Never    Attends Religious Services: More than 4 times per year    Active Member of [employer] or Organizations: No    Attends Music therapist: Never    Marital Status: Divorced    Tobacco Counseling Counseling given: Not Answered   Clinical Intake:  Pre-visit preparation completed: Yes  Pain : 0-10 Pain Score: 5  Pain Type: Chronic pain Pain Location: Knee Pain Orientation: Right, Left Pain Radiating Towards: swelling Pain Onset: More than a month ago Pain Relieving Factors: taking fluid pill  Pain Relieving Factors: taking fluid pill  Nutritional Risks: None Diabetes: Yes CBG done?: No Did pt. bring in CBG monitor from home?: No  How often do you need to have someone help you when you read instructions, pamphlets, or  other written materials from your doctor or pharmacy?: 1 - Never  Diabetic?yes Nutrition Risk Assessment:  Has the patient had any N/V/D within the last 2 months?  No  Does the patient have any non-healing wounds?  No  Has the patient had any unintentional weight loss or weight gain?  No   Diabetes:  Is the patient diabetic?  Yes  If diabetic, was a CBG obtained today?  No  Did the patient  bring in their glucometer from home?  No  How often do you monitor your CBG's? Twice/day.   Financial Strains and Diabetes Management:  Are you having any financial strains with the device, your supplies or your medication? No .  Does the patient want to be seen by Chronic Care Management for management of their diabetes?  No  Would the patient like to be referred to a Nutritionist or for Diabetic Management?  No   Diabetic Exams:  Diabetic Eye Exam: Completed 10/22/21. Pt has been advised about the importance in completing this exam.- had exam in December 2023 by Dr.Bell  Diabetic Foot Exam: Completed 05/01/22. Pt has been advised about the importance in completing this exam.    Interpreter Needed?: No  Information entered by :: Kirke Shaggy, LPN   Activities of Daily Living    05/24/2022   10:35 AM 05/01/2022   10:44 AM  In your present state of health, do you have any difficulty performing the following activities:  Hearing? 0 1  Vision? 1 1  Difficulty concentrating or making decisions? 0 0  Walking or climbing stairs? 1 1  Dressing or bathing? 0 0  Doing errands, shopping? 1 1  Preparing Food and eating ? N   Using the Toilet? N   In the past six months, have you accidently leaked urine? N   Do you have problems with loss of bowel control? N   Managing your Medications? N   Managing your Finances? N   Housekeeping or managing your Housekeeping? N     Patient Care Team: [name], DO as PCP - General (Family Medicine) End, Harrell Gave, MD as PCP - Cardiology  (Cardiology) Cammie Sickle, MD as Consulting Physician (Internal Medicine) Lorelee Cover., MD (Ophthalmology) End, Harrell Gave, MD as Consulting Physician (Cardiology) Vanita Ingles, RN as Case Manager ([place]) Cammie Sickle, MD as Consulting Physician (Internal Medicine) Curley Spice, Virl [name], RPH-CPP as Pharmacist  Indicate any recent Medical Services you may have received from other than [facility] providers in the past year (date may be approximate).     Assessment:   This is a routine wellness examination for [place].  Hearing/Vision screen Hearing Screening - Comments:: No aids Vision Screening - Comments:: Wears glasses- Dr.Bell  Dietary issues and exercise activities discussed: Current Exercise Habits: Home exercise routine, Type of exercise: stretching, Time (Minutes): 15, Frequency (Times/Week): 7, Weekly Exercise (Minutes/Week): 105, Intensity: Mild   Goals Addressed             This Visit's Progress    DIET - EAT MORE FRUITS AND VEGETABLES   Not on track      Depression Screen    05/24/2022   10:32 AM 05/01/2022   10:43 AM 12/10/2021    2:21 PM 05/18/2021   10:31 AM 11/23/2020    9:46 AM 04/25/2020   10:53 AM 03/10/2020    9:55 AM  PHQ 2/9 Scores  PHQ - 2 Score 0 0 0 0 1 0 0  PHQ- 9 Score 0 0   3      Fall Risk    05/24/2022   10:35 AM 05/01/2022   10:43 AM 03/11/2022    1:35 PM 05/18/2021   10:37 AM 04/25/2020   10:52 AM  Fall Risk   Falls in the past year? 0 0 0 0 0  Number falls in past yr: 0 0 0 0   Injury with Fall? 0 0 0 0   Risk for fall  due to : No Fall Risks No Fall Risks Impaired balance/gait No Fall Risks Medication side effect  Follow up Falls prevention discussed;Falls evaluation completed Falls evaluation completed Falls evaluation completed;Education provided;Falls prevention discussed Falls evaluation completed Falls evaluation completed;Education provided;Falls prevention discussed    FALL RISK PREVENTION  PERTAINING TO THE HOME:  Any stairs in or around the home? No  If so, are there any without handrails? No  Home free of loose throw rugs in walkways, pet beds, electrical cords, etc? Yes  Adequate lighting in your home to reduce risk of falls? Yes   ASSISTIVE DEVICES UTILIZED TO PREVENT FALLS:  Life alert? No  Use of a cane, walker or w/c? Yes  Grab bars in the bathroom? Yes  Shower chair or bench in shower? Yes  Elevated toilet seat or a handicapped toilet? Yes    Cognitive Function:        05/24/2022   10:43 AM 04/25/2020   10:55 AM 04/14/2018   11:09 AM 04/08/2017    3:18 PM  6CIT Screen  What Year? 0 points 0 points 0 points 0 points  What month? 0 points 0 points 0 points 0 points  What time? 0 points 0 points 0 points 0 points  Count back from 20 0 points 0 points 0 points 0 points  Months in reverse 0 points 0 points 0 points 0 points  Repeat phrase 0 points 0 points 6 points 0 points  Total Score 0 points 0 points 6 points 0 points    Immunizations Immunization History  Administered Date(s) Administered   Fluad Quad(high Dose 65+) 01/12/2019, 02/11/2020   Influenza, High Dose Seasonal PF 02/09/2014, 01/05/2015, 01/30/2016, 01/14/2017, 02/17/2021   Influenza,inj,Quad PF,6+ Mos 04/05/2013   Influenza-Unspecified 02/03/2014, 02/03/2018, 01/18/2022   Moderna SARS-COV2 Booster Vaccination 04/25/2021   Moderna Sars-Covid-2 Vaccination 09/25/2019, 10/23/2019, 04/23/2020   Pneumococcal Conjugate-13 08/05/2014   Pneumococcal Polysaccharide-23 04/05/2013   Zoster Recombinat (Shingrix) 08/04/2020, 12/13/2020    TDAP status: Up to date  Flu Vaccine status: Up to date  Pneumococcal vaccine status: Up to date  Covid-19 vaccine status: Completed vaccines  Qualifies for Shingles Vaccine? Yes   Zostavax completed No   Shingrix Completed?: Yes  Screening Tests Health Maintenance  Topic Date Due   DTaP/Tdap/Td (1 - Tdap) Never done   COVID-19 Vaccine (4 - 2023-24  season) 01/04/2022   OPHTHALMOLOGY EXAM  10/23/2022   HEMOGLOBIN A1C  10/24/2022   Diabetic kidney evaluation - eGFR measurement  04/25/2023   Diabetic kidney evaluation - Urine ACR  05/02/2023   FOOT EXAM  05/02/2023   Medicare Annual Wellness (AWV)  05/25/2023   DEXA SCAN  05/18/2024   COLONOSCOPY (Pts 45-34yr Insurance coverage will need to be confirmed)  03/12/2025   Pneumonia Vaccine 77 Years old  Completed   INFLUENZA VACCINE  Completed   Hepatitis C Screening  Completed   Zoster Vaccines- Shingrix  Completed   HPV VACCINES  Aged Out    Health Maintenance  Health Maintenance Due  Topic Date Due   DTaP/Tdap/Td (1 - Tdap) Never done   COVID-19 Vaccine (4 - 2023-24 season) 01/04/2022    Colorectal cancer screening: No longer required.   Mammogram status: No longer required due to age.  Bone Density status: Completed 05/18/14. Results reflect: Bone density results: NORMAL. Repeat every 5 years.- declined referral  Lung Cancer Screening: (Low Dose CT Chest recommended if Age 266-80years, 30 pack-year currently smoking OR have quit w/in 15years.) does not qualify.  Additional Screening:  Hepatitis C Screening: does qualify; Completed 07/21/17  Vision Screening: Recommended annual ophthalmology exams for early detection of glaucoma and other disorders of the eye. Is the patient up to date with their annual eye exam?  Yes  Who is the provider or what is the name of the office in which the patient attends annual eye exams? Dr.Bell If pt is not established with a provider, would they like to be referred to a provider to establish care? No .   Dental Screening: Recommended annual dental exams for proper oral hygiene  Community Resource Referral / Chronic Care Management: CRR required this visit?  No   CCM required this visit?  No      Plan:     I have personally reviewed and noted the following in the patient's chart:   Medical and social history Use of alcohol,  tobacco or illicit drugs  Current medications and supplements including opioid prescriptions. Patient is not currently taking opioid prescriptions. Functional ability and status Nutritional status Physical activity Advanced directives List of other physicians Hospitalizations, surgeries, and ER visits in previous 12 months Vitals Screenings to include cognitive, depression, and falls Referrals and appointments  In addition, I have reviewed and discussed with patient certain preventive protocols, quality metrics, and best practice recommendations. A written personalized care plan for preventive services as well as general preventive health recommendations were provided to patient.     Dionisio David, LPN   1/83/3582   Nurse Notes: none

## 2022-06-03 ENCOUNTER — Ambulatory Visit: Payer: Self-pay

## 2022-06-03 ENCOUNTER — Encounter: Payer: Self-pay | Admitting: Family Medicine

## 2022-06-03 NOTE — Telephone Encounter (Signed)
  Chief Complaint: Painful urination Symptoms: Pain, Temp.99.1 Frequency: 3 days ago Pertinent Negatives: Patient denies feveer Disposition: '[]'$ ED /'[]'$ Urgent Care (no appt availability in office) / '[x]'$ Appointment(In office/virtual)/ '[]'$  [place] [facility]/ '[]'$ Home Care/ '[]'$ Refused Recommended Disposition /'[]'$ [place] [facility]/ '[]'$  Follow-up with PCP Additional Notes: Daughter may take pt. To UC today. Answer Assessment - Initial Assessment Questions 1. SEVERITY: "How bad is the pain?"  (e.g., Scale 1-10; mild, moderate, or severe)   - MILD (1-3): complains slightly about urination hurting   - MODERATE (4-7): interferes with normal activities     - SEVERE (8-10): excruciating, unwilling or unable to urinate because of the pain      Moderate 2. FREQUENCY: "How many times have you had painful urination today?"      No 3. PATTERN: "Is pain present every time you urinate or just sometimes?"      Constant 4. ONSET: "When did the painful urination start?"      3 days ago 5. FEVER: "Do you have a fever?" If Yes, ask: "What is your temperature, how was it measured, and when did it start?"     No 6. PAST UTI: "Have you had a urine infection before?" If Yes, ask: "When was the last time?" and "What happened that time?"      Yes 7. CAUSE: "What do you think is causing the painful urination?"  (e.g., UTI, scratch, Herpes sore)     UTI 8. OTHER SYMPTOMS: "Do you have any other symptoms?" (e.g., blood in urine, flank pain, genital sores, urgency, vaginal discharge)     Abdominal 9. PREGNANCY: "Is there any chance you are pregnant?" "When was your last menstrual period?"     No  Protocols used: Urination Pain - Female-A-AH

## 2022-06-04 ENCOUNTER — Encounter: Payer: Self-pay | Admitting: Internal Medicine

## 2022-06-04 ENCOUNTER — Ambulatory Visit (INDEPENDENT_AMBULATORY_CARE_PROVIDER_SITE_OTHER): Payer: Medicare Other | Admitting: Internal Medicine

## 2022-06-04 VITALS — BP 124/62 | HR 90 | Temp 96.8°F | Wt 241.0 lb

## 2022-06-04 DIAGNOSIS — R300 Dysuria: Secondary | ICD-10-CM | POA: Diagnosis not present

## 2022-06-04 DIAGNOSIS — R3989 Other symptoms and signs involving the genitourinary system: Secondary | ICD-10-CM | POA: Diagnosis not present

## 2022-06-04 LAB — POCT URINALYSIS DIPSTICK
Bilirubin, UA: NEGATIVE
Blood, UA: NEGATIVE
Glucose, UA: NEGATIVE
Ketones, UA: NEGATIVE
Nitrite, UA: POSITIVE
Protein, UA: POSITIVE — AB
Spec Grav, UA: >=1.03 — AB (ref 1.010–1.025)
Urobilinogen, UA: 0.2 E.U./dL
pH, UA: 5 (ref 5.0–8.0)

## 2022-06-04 MED ORDER — NITROFURANTOIN MONOHYD MACRO 100 MG PO CAPS: 100.0000 mg | ORAL_CAPSULE | Freq: Two times a day (BID) | ORAL | 0 refills | Status: DC

## 2022-06-04 NOTE — Progress Notes (Signed)
HPI  Pt presents to the clinic today with c/o bladder pressure and dysuria.  This started 4 days ago.  She denies urgency, frequency, blood in her urine, low back pain, fever, chills, nausea or vomiting.  She has increased her water intake and try cranberry OTC with minimal relief of symptoms.   Review of Systems  Past Medical History:  Diagnosis Date   Anemia    Arthritis    legs, knees, back, wrists   Diabetes mellitus without complication (HCC)    Hypertension    Nausea    Patient is [name]    S/P TAVR (transcatheter aortic valve replacement) 11/09/2019   s/p TAVR with a 26 mm Edwards S3U via the TF approach by Dr. Angelena Form & Bartle   Severe aortic stenosis    SIRS (systemic inflammatory response syndrome) ([place]) 11/2019   Sjogren's disease ([place])    Thyroid disease     Family History  Problem Relation Age of Onset   Parkinson's disease Mother    Heart disease Father    Lupus Sister    Aneurysm Son    Lupus Son    Breast cancer Maternal Aunt        mat great aunt    Social History   Socioeconomic History   Marital status: Divorced    Spouse name: Not on file   Number of children: 2   Years of education: 12   Highest education level: 12th grade  Occupational History   Occupation: retired-worked with nandicapped adults  Tobacco Use   Smoking status: Former    Years: 20.00    Types: Cigarettes    Quit date: 09/05/1979    Years since quitting: 42.7   Smokeless tobacco: Former  Scientific laboratory technician Use: Never used  Substance and Sexual Activity   Alcohol use: Yes    Alcohol/week: 0.0 - 1.0 standard drinks of alcohol    Comment: occ. wine   Drug use: No   Sexual activity: Not on file  Other Topics Concern   Not on file  Social History Narrative   Not on file   Social Determinants of Health   Financial Resource Strain: Low Risk  (05/24/2022)   Overall Financial Resource Strain (CARDIA)    Difficulty of Paying Living Expenses: Not hard at all   Food Insecurity: No Food Insecurity (05/24/2022)   Hunger Vital Sign    Worried About Running Out of Food in the Last Year: Never true    Ran Out of Food in the Last Year: Never true  Transportation Needs: No Transportation Needs (05/24/2022)   PRAPARE - Hydrologist (Medical): No    Lack of Transportation (Non-Medical): No  Physical Activity: Insufficiently Active (05/24/2022)   Exercise Vital Sign    Days of Exercise per Week: 7 days    Minutes of Exercise per Session: 10 min  Stress: No Stress Concern Present (05/24/2022)   [place]    Feeling of Stress : Not at all  Social Connections: Socially Isolated (05/24/2022)   Social Connection and Isolation Panel [NHANES]    Frequency of Communication with Friends and Family: Twice a week    Frequency of Social Gatherings with Friends and Family: Never    Attends Religious Services: More than 4 times per year    Active Member of [employer] or Organizations: No    Attends Archivist Meetings: Never    Marital Status:  Divorced  Intimate Partner Violence: Not At Risk (05/24/2022)   Humiliation, Afraid, Rape, and Kick questionnaire    Fear of Current or Ex-Partner: No    Emotionally Abused: No    Physically Abused: No    Sexually Abused: No    Allergies  Allergen Reactions   Ferumoxytol Swelling    Swelling in neck Swelling in neck Swelling in neck   Penicillins Rash, Swelling and Hives   Iron    Other Other (See Comments)    Other reaction(s): Other (See Comments) She refuses blood products-Jehovah witness She refuses blood products-Jehovah witness Other reaction(s): Other (See Comments) She refuses blood products-Jehovah witness     Constitutional: Denies fever, malaise, fatigue, headache or abrupt weight changes.   GU: Pt reports bladder pressure and and pain with urination. Denies urgency, [place], blood in urine, odor or  discharge. Skin: Denies redness, rashes, lesions or ulcercations.   No other specific complaints in a complete review of systems (except as listed in HPI above).    Objective:   Physical Exam  BP 124/62 (BP Location: Left Arm, Patient Position: Sitting, Cuff Size: Large)   Pulse 90   Temp (!) 96.8 F (36 C) (Temporal)   Wt 241 lb (109.3 kg)   SpO2 99%   BMI 47.07 kg/m   Wt Readings from Last 3 Encounters:  05/24/22 243 lb (110.2 kg)  05/01/22 243 lb 9.6 oz (110.5 kg)  01/11/22 238 lb 1.6 oz (108 kg)    General: Appears her stated age, well developed, well nourished in NAD. Cardiovascular: Normal rate. Pulmonary/Chest: Normal effort. Abdomen: Soft. Normal bowel sounds. No distention or masses noted.  Tender to palpation over the bladder area. No CVA tenderness.        Assessment & Plan:   Bladder Pressure, Dysuria secondary to Presumed UTI:  Urinalysis: Small leuks, positive nitrites Will send urine culture eRx sent if for Macrobid 100 mg BID x 5 days OK to take AZO OTC Drink plenty of fluids  RTC as needed or if symptoms persist. Webb Silversmith, NP

## 2022-06-04 NOTE — Patient Instructions (Signed)
Urinary Tract Infection, Adult ?A urinary tract infection (UTI) is an infection of any part of the urinary tract. The urinary tract includes: ?The kidneys. ?The ureters. ?The bladder. ?The urethra. ?These organs make, store, and get rid of pee (urine) in the body. ?What are the causes? ?This infection is caused by germs (bacteria) in your genital area. These germs grow and cause swelling (inflammation) of your urinary tract. ?What increases the risk? ?The following factors may make you more likely to develop this condition: ?Using a small, thin tube (catheter) to drain pee. ?Not being able to control when you pee or poop (incontinence). ?Being female. If you are female, these things can increase the risk: ?Using these methods to prevent pregnancy: ?A medicine that kills sperm (spermicide). ?A device that blocks sperm (diaphragm). ?Having low levels of a female hormone (estrogen). ?Being pregnant. ?You are more likely to develop this condition if: ?You have genes that add to your risk. ?You are sexually active. ?You take antibiotic medicines. ?You have trouble peeing because of: ?A prostate that is bigger than normal, if you are female. ?A blockage in the part of your body that drains pee from the bladder. ?A kidney stone. ?A nerve condition that affects your bladder. ?Not getting enough to drink. ?Not peeing often enough. ?You have other conditions, such as: ?Diabetes. ?A weak disease-fighting system (immune system). ?Sickle cell disease. ?Gout. ?Injury of the spine. ?What are the signs or symptoms? ?Symptoms of this condition include: ?Needing to pee right away. ?Peeing small amounts often. ?Pain or burning when peeing. ?Blood in the pee. ?Pee that smells bad or not like normal. ?Trouble peeing. ?Pee that is cloudy. ?Fluid coming from the vagina, if you are female. ?Pain in the belly or lower back. ?Other symptoms include: ?Vomiting. ?Not feeling hungry. ?Feeling mixed up (confused). This may be the first symptom in  older adults. ?Being tired and grouchy (irritable). ?A fever. ?Watery poop (diarrhea). ?How is this treated? ?Taking antibiotic medicine. ?Taking other medicines. ?Drinking enough water. ?In some cases, you may need to see a specialist. ?Follow these instructions at home: ? ?Medicines ?Take over-the-counter and prescription medicines only as told by your doctor. ?If you were prescribed an antibiotic medicine, take it as told by your doctor. Do not stop taking it even if you start to feel better. ?General instructions ?Make sure you: ?Pee until your bladder is empty. ?Do not hold pee for a long time. ?Empty your bladder after sex. ?Wipe from front to back after peeing or pooping if you are a female. Use each tissue one time when you wipe. ?Drink enough fluid to keep your pee pale yellow. ?Keep all follow-up visits. ?Contact a doctor if: ?You do not get better after 1-2 days. ?Your symptoms go away and then come back. ?Get help right away if: ?You have very bad back pain. ?You have very bad pain in your lower belly. ?You have a fever. ?You have chills. ?You feeling like you will vomit or you vomit. ?Summary ?A urinary tract infection (UTI) is an infection of any part of the urinary tract. ?This condition is caused by germs in your genital area. ?There are many risk factors for a UTI. ?Treatment includes antibiotic medicines. ?Drink enough fluid to keep your pee pale yellow. ?This information is not intended to replace advice given to you by your health care provider. Make sure you discuss any questions you have with your health care provider. ?Document Revised: 12/03/2019 Document Reviewed: 12/03/2019 ?Elsevier Patient Education ?   [place].

## 2022-06-05 ENCOUNTER — Ambulatory Visit: Payer: Medicare Other | Admitting: Pharmacist

## 2022-06-05 DIAGNOSIS — I5030 Unspecified diastolic (congestive) heart failure: Secondary | ICD-10-CM

## 2022-06-05 DIAGNOSIS — I5032 Chronic diastolic (congestive) heart failure: Secondary | ICD-10-CM

## 2022-06-05 DIAGNOSIS — E1159 Type 2 diabetes mellitus with other circulatory complications: Secondary | ICD-10-CM | POA: Diagnosis not present

## 2022-06-05 DIAGNOSIS — I110 Hypertensive heart disease with heart failure: Secondary | ICD-10-CM | POA: Diagnosis not present

## 2022-06-05 DIAGNOSIS — M0609 Rheumatoid arthritis without rheumatoid factor, multiple sites: Secondary | ICD-10-CM

## 2022-06-05 DIAGNOSIS — I10 Essential (primary) hypertension: Secondary | ICD-10-CM

## 2022-06-05 DIAGNOSIS — E1169 Type 2 diabetes mellitus with other specified complication: Secondary | ICD-10-CM

## 2022-06-05 NOTE — Patient Instructions (Signed)
Visit Information  Thank you for taking time to visit with me today. Please don't hesitate to contact me if I can be of assistance to you before our next scheduled telephone appointment.  Following are the goals we discussed today:   Goals Addressed             This Visit's Progress    Pharmacy Goals       Our goal A1c is less than 7%. This corresponds with fasting sugars less than 130 and 2 hour after meal sugars less than 180. Please keep a log of your results when checking your blood sugar  Our goal bad cholesterol, or LDL, is less than 70 . This is why it is important to continue taking your simvastatin  Please check your home blood pressure, keep a log of the results and bring this with you to your medical appointments.  Feel free to call me with any questions or concerns. I look forward to our next call!  Wallace Cullens, PharmD, Para [place], CPP Clinical Pharmacist [facility] [phone]          Our next appointment is by telephone on 06/21/2022 at 11:30 am  Please call the care guide team at [phone] if you need to cancel or reschedule your appointment.    Patient verbalizes understanding of instructions and care plan provided today and agrees to view in [facility]. Active MyChart status and patient understanding of how to access instructions and care plan via MyChart confirmed with patient.

## 2022-06-05 NOTE — Chronic Care Management (AMB) (Addendum)
Chronic Care Management CCM Pharmacy Note  06/05/2022 Name:  Diana Harmon MRN:  [id number] DOB:  1945-08-27   Subjective: Diana Harmon is an 77 y.o. year old female who is a primary patient of [name], DO.  The CCM team was consulted for assistance with disease management and care coordination needs.    Engaged with patient by telephone for follow up visit for pharmacy case management and/or care coordination services.   Objective:  Medications Reviewed Today     Reviewed by Jearld Fenton, NP (Nurse Practitioner) on 06/04/22 at 1149  Med List Status: <None>   Medication Order Taking? Sig Documenting Provider Last Dose Status Informant  ACCU-CHEK GUIDE test strip [id number] Yes USE TO CHECK BLOOD SUGAR TWICE DAILY [name], DO Taking Active   Accu-Chek Softclix Lancets lancets [id number] Yes USE TO CHECK GLUCOSE TWICE DAILY [place] Devonne Doughty, DO Taking Active   acetaminophen (TYLENOL) 500 MG tablet [id number] Yes Take 1,000 mg by mouth every 6 (six) hours as needed for moderate pain.  [provider] Taking Active Child  Artificial Tear Solution (GENTEAL TEARS) 0.1-0.2-0.3 % SOLN [id number] Yes Place 1 drop into both eyes 3 (three) times daily as needed (dry/irritated eyes.). [provider] Taking Active Child  aspirin 81 MG chewable tablet [id number] Yes Chew 1 tablet (81 mg total) by mouth daily. Eileen Stanford, PA-C Taking Active Child  B COMPLEX-C PO [id number] Yes Take 10 mLs by mouth daily. [provider] Taking Active Child  baclofen (LIORESAL) 10 MG tablet [id number] Yes TAKE 1 TO 2 TABLETS BY MOUTH TWICE DAILY AS NEEDED FOR MUSCLE SPASM [place], Devonne Doughty, DO Taking Active   Blood Glucose Monitoring Suppl (ACCU-CHEK GUIDE) w/Device KIT [id number] Yes Use to check blood sugar twice per day [name], DO Taking Active   Cholecalciferol (VITAMIN D3) 125 MCG (5000 UT) TABS  [id number] Yes Take 5,000 Units by mouth daily. [provider] Taking Active Child    Discontinued 09/05/19 1316   cyanocobalamin 1000 MCG tablet [id number] Yes Take by mouth every other day. [provider] Taking Active            Med Note Drema Dallas, Virgina Jock   Fri May 24, 2022 10:29 AM) [name] every 3 days  Ferrous Sulfate (IRON) 325 (65 Fe) MG TABS [id number] Yes TAKE 1 TABLET BY MOUTH TWICE DAILY WITH A MEAL [name], Devonne Doughty, DO Taking Active Child  furosemide (LASIX) 20 MG tablet [id number] Yes Take 1 tablet by mouth once daily  Patient taking differently: Take 20 mg by mouth daily as needed.   End, Harrell Gave, MD Taking Active   hydrochlorothiazide (HYDRODIURIL) 25 MG tablet [id number] Yes Take 1 tablet by mouth once daily [name], MD Taking Active   hydroxychloroquine (PLAQUENIL) 200 MG tablet [id number] Yes Take 1 tablet by mouth 2 (two) times daily. [provider] Taking Active   lactobacillus acidophilus (BACID) TABS tablet [id number] Yes Take 1 tablet by mouth daily as needed (digestive health.). [provider] Taking Active            Med Note Nat Christen   Wed Nov 03, 2019  3:06 PM)    Lancets Misc. KIT [id number] Yes Use to check blood sugar twice daily [provider] Taking Active   levocetirizine (XYZAL) 5 MG tablet [id number] Yes TAKE 1 TABLET BY MOUTH ONCE DAILY IN THE EVENING  Patient taking differently: Take 1/2 tablet (2.5 mg) daily in the  evening as needed for allergies   [name], DO Taking Active   losartan (COZAAR) 50 MG tablet [id number] Yes Take 1 tablet by mouth once daily [name], MD Taking Active   magnesium oxide (MAG-OX) 400 MG tablet [id number] Yes Take 400 mg by mouth daily. [provider] Taking Active   mupirocin ointment (BACTROBAN) 2 % [id number] Yes Apply 1 Application topically 2 (two) times daily.  Patient taking differently: Apply 1 Application topically 2  (two) times daily as needed.   [name], DO Taking Active   nitrofurantoin, macrocrystal-monohydrate, (MACROBID) 100 MG capsule [id number] Yes Take 1 capsule (100 mg total) by mouth 2 (two) times daily. Jearld Fenton, NP  Active   simvastatin (ZOCOR) 20 MG tablet [id number] Yes TAKE 1 TABLET BY MOUTH ONCE DAILY AT 6 PM [name], Devonne Doughty, DO Taking Active   TRULICITY 3.26 ZT/2.4PY SOPN [id number] Yes Inject 0.75 mg into the skin once a week. [name], DO Taking Active             Pertinent Labs:  Lab Results  Component Value Date   HGBA1C 6.7 (H) 04/24/2022   Lab Results  Component Value Date   CHOL 159 04/24/2022   HDL 76 04/24/2022   LDLCALC 67 04/24/2022   TRIG 77 04/24/2022   CHOLHDL 2.1 04/24/2022   Lab Results  Component Value Date   CREATININE 1.44 (H) 04/24/2022   BUN 34 (H) 04/24/2022   NA 145 04/24/2022   K 4.0 04/24/2022   CL 105 04/24/2022   CO2 28 04/24/2022   BP Readings from Last 3 Encounters:  06/04/22 124/62  05/01/22 138/68  01/11/22 (!) 127/55   Pulse Readings from Last 3 Encounters:  06/04/22 90  05/01/22 79  01/11/22 78     SDOH:  (Social Determinants of Health) assessments and interventions performed:  SDOH Interventions    Flowsheet Row Clinical Support from 05/24/2022 in [place] Management from 05/13/2022 in [place] Coordination from 01/21/2022 in [place] Coordination from 12/10/2021 in [place] from 05/18/2021 in [place] Management from 02/22/2021 in [facility]  SDOH Interventions        Food Insecurity Interventions Intervention Not Indicated Intervention Not Indicated Intervention Not Indicated Intervention Not Indicated Intervention Not Indicated  Intervention Not Indicated  Housing Interventions Intervention Not Indicated Intervention Not Indicated Intervention Not Indicated Intervention Not Indicated Intervention Not Indicated Intervention Not Indicated  Transportation Interventions Intervention Not Indicated Intervention Not Indicated Intervention Not Indicated Intervention Not Indicated Intervention Not Indicated Intervention Not Indicated  Utilities Interventions Intervention Not Indicated Intervention Not Indicated Intervention Not Indicated -- -- --  Alcohol Usage Interventions Intervention Not Indicated (Score <7) Intervention Not Indicated (Score <7) -- -- -- --  Financial Strain Interventions Intervention Not Indicated Intervention Not Indicated -- Intervention Not Indicated Intervention Not Indicated Intervention Not Indicated  Physical Activity Interventions Intervention Not Indicated Intervention Not Indicated -- Other (Comments)  [patient is working with specialist due to issues with muscles in neck and arm. She is limited in what she can do currently with being active] Intervention Not Indicated Other (Comments)  [The patient is doing exercises the PT has showed her]  Stress Interventions Intervention Not Indicated Intervention Not Indicated -- Intervention Not Indicated  [concerned over what is going on  with her neck and arm muscles] Intervention Not Indicated Intervention Not Indicated  Social Connections Interventions Intervention Not Indicated Intervention Not Indicated -- Intervention Not Indicated Intervention Not Indicated Other (Comment)  [good support system]       CCM Care Plan  Review of patient past medical history, allergies, medications, health status, including review of consultants reports, laboratory and other test data, was performed as part of comprehensive evaluation and provision of chronic care management services.   Care Plan : PharmD - Medication Assistance/Management  Updates made by Rennis Petty, RPH-CPP since 06/05/2022 12:00 AM     Problem: Disease Progression      Long-Range Goal: Disease Progression Prevented or Minimized   Start Date: 10/30/2020  Expected End Date: 01/28/2021  Recent Progress: On track  Priority: High  Note:   Current Barriers:  Unable to independently afford treatment regimen Patient APPROVED for [facility] Patient Assistance Program for Trulicity, eligible through 05/06/2023  Pharmacist Clinical Goal(s):  Over the next 90 days, patient will verbalize ability to afford treatment regimen through collaboration with PharmD and provider.   Interventions: 1:1 collaboration with [name], DO regarding development and update of comprehensive plan of care as evidenced by provider attestation and co-signature Inter-disciplinary care team collaboration (see longitudinal plan of care)  Medication Assistance: Patient reports found that the cost of a 5-monthsupply of Orencia from [employer] >$1000 Confirms has contacted her [employer] to confirm medication cost through her plan Unable to review specific health plan benefits for patient today as she does not currently have her 2024 plan card Patient/daughter plan to contact [employer]to obtain a copy of 2024 plan card Note based on reported income, patient would not qualify for Extra Help subsidy from [employer]Again counsel patient on requirements for the [place] assistance program through [place]([employer]). Note for Medicare patients, [employer] requires patient to meet 3% annual out of pocket expense requirement before would be eligible From review of [employer], note [place] Patient [place] are all currently closed Encourage patient to contact [employer] sign up for RA fund waitlist If cost of initial supply of medication unaffordable to patient, encourage  her/daughter to follow up with Rheumatologist regarding alternative therapy options  T2DM: Control improved based on latest A1C reading; current treatment: Trulicity 07.91mg weekly Previous therapies: metformin Reports recent blood sugar readings: Ranging 98-143 Last checked yesterday: morning reading: 110; bedtime: 135 Denies symptoms of hypoglycemia Cholesterol management: simvastatin 20 mg daily Exercise Reports exercise limited by arthritis Continuing exercises from recent PT/OT and stretching at home Encourage patient to have regular well-balanced meals throughout the day, while controlling carbohydrate portion sizes Encourage patient to continue to monitor home blood sugar, keep log and bring record with her to medical appointments   HTN/Chronic HFpEF: Current treatment: HCTZ 25 mg daily Losartan 50 mg daily Furosemide 20 mg daily as needed for edema Note patient has home upper arm blood pressure monitor Reports recent home blood pressure readings from past week: 138/56, HR 86; 131/58, HR 84; 133/54, HR 80; 115/54, HR 80 Reports continues to elevate legs and using compression socks during the day Have counseled on impact of salt/sodium in diet on blood pressure Encouraged patient to bring record of daily weights and home BP readings with her to medical appointments, but to call provider sooner if readings outside of established parameters  Patient Goals/Self-Care Activities Over  the next 90 days, patient will:  - take medications as prescribed - check glucose, document, and provide at future appointments - check blood pressure, document, and provide at future appointments - collaborate with provider on medication access solutions - attend medical appointments as scheduled         Plan: Telephone follow up appointment with care management team member scheduled for:  06/21/2022 at 11:30 am  Wallace Cullens, PharmD, Para March, [place] [phone]

## 2022-06-06 LAB — URINE CULTURE
MICRO NUMBER:: 14493469
SPECIMEN QUALITY:: ADEQUATE

## 2022-06-06 MED ORDER — SULFAMETHOXAZOLE-TRIMETHOPRIM 400-80 MG PO TABS: 1.0000 | ORAL_TABLET | Freq: Two times a day (BID) | ORAL | 0 refills | Status: DC

## 2022-06-06 NOTE — Addendum Note (Signed)
Addended by: Jearld Fenton on: 06/06/2022 02:56 PM   Modules accepted: Orders

## 2022-06-07 LAB — MICROALBUMIN / CREATININE URINE RATIO: Microalb Creat Ratio: 29.999

## 2022-06-08 ENCOUNTER — Other Ambulatory Visit: Payer: Self-pay | Admitting: Family Medicine

## 2022-06-08 DIAGNOSIS — E7800 Pure hypercholesterolemia, unspecified: Secondary | ICD-10-CM

## 2022-06-10 NOTE — Telephone Encounter (Signed)
Requested Prescriptions  Pending Prescriptions Disp Refills   simvastatin (ZOCOR) 20 MG tablet [Pharmacy Med Name: Simvastatin 20 MG Oral Tablet] 90 tablet 0    Sig: TAKE 1 TABLET BY MOUTH ONCE DAILY AT  6PM     Cardiovascular:  Antilipid - Statins Failed - 06/08/2022  6:50 AM      Failed - Lipid Panel in normal range within the last 12 months    Cholesterol, Total  Date Value Ref Range Status  05/03/2020 170 100 - 199 mg/dL Final   Cholesterol  Date Value Ref Range Status  04/24/2022 159 <200 mg/dL Final   LDL Cholesterol (Calc)  Date Value Ref Range Status  04/24/2022 67 mg/dL (calc) Final    Comment:    Reference range: <100 . Desirable range <100 mg/dL for primary prevention;   <70 mg/dL for patients with CHD or diabetic patients  with > or = 2 CHD risk factors. Marland Kitchen LDL-C is now calculated using the Martin-Hopkins  calculation, which is a validated novel method providing  better accuracy than the Friedewald equation in the  estimation of LDL-C.  Cresenciano Genre et al. Annamaria Helling. 9977;414(23): 2061-2068  (http://education.[employer].com/faq/FAQ164)    HDL  Date Value Ref Range Status  04/24/2022 76 > OR = 50 mg/dL Final  05/03/2020 54 >39 mg/dL Final   Triglycerides  Date Value Ref Range Status  04/24/2022 77 <150 mg/dL Final         Passed - Patient is not pregnant      Passed - Valid encounter within last 12 months    Recent Outpatient Visits           6 days ago [facility], Coralie Keens, NP   1 month ago Annual physical exam   [facility] [place], Devonne Doughty, DO   7 months ago Type 2 diabetes mellitus with other specified complication, without long-term current use of insulin [facility])   [place], DO   1 year ago Annual physical exam   [facility] [name], DO   1 year ago Type 2 diabetes  mellitus with other specified complication, without long-term current use of insulin [facility])   [place], DO       Future Appointments             In 4 months Parks Ranger, Devonne Doughty, DO [facility], [facility]

## 2022-06-18 ENCOUNTER — Other Ambulatory Visit: Payer: Self-pay | Admitting: Internal Medicine

## 2022-06-18 NOTE — Telephone Encounter (Signed)
Please contact pt for future appointment. Pt due for 12 month f/u.

## 2022-06-21 ENCOUNTER — Ambulatory Visit (INDEPENDENT_AMBULATORY_CARE_PROVIDER_SITE_OTHER): Payer: Medicare Other | Admitting: Pharmacist

## 2022-06-21 DIAGNOSIS — I5032 Chronic diastolic (congestive) heart failure: Secondary | ICD-10-CM

## 2022-06-21 DIAGNOSIS — E1169 Type 2 diabetes mellitus with other specified complication: Secondary | ICD-10-CM

## 2022-06-21 DIAGNOSIS — I10 Essential (primary) hypertension: Secondary | ICD-10-CM

## 2022-06-21 NOTE — Patient Instructions (Signed)
Visit Information  Thank you for taking time to visit with me today. Please don't hesitate to contact me if I can be of assistance to you before our next scheduled telephone appointment.  Following are the goals we discussed today:   Goals Addressed             This Visit's Progress    Pharmacy Goals       Our goal A1c is less than 7%. This corresponds with fasting sugars less than 130 and 2 hour after meal sugars less than 180. Please keep a log of your results when checking your blood sugar  Our goal bad cholesterol, or LDL, is less than 70 . This is why it is important to continue taking your simvastatin  Please check your home blood pressure, keep a log of the results and bring this with you to your medical appointments.  Feel free to call me with any questions or concerns. I look forward to our next call!   Wallace Cullens, PharmD, Para [place], CPP Clinical Pharmacist [facility] [phone]          Our next appointment is by telephone on 08/19/2022 at 10 am  Please call the care guide team at [phone] if you need to cancel or reschedule your appointment.    Patient verbalizes understanding of instructions and care plan provided today and agrees to view in [place]. Active MyChart status and patient understanding of how to access instructions and care plan via MyChart confirmed with patient.

## 2022-06-21 NOTE — Chronic Care Management (AMB) (Signed)
Chronic Care Management CCM Pharmacy Note  06/21/2022 Name:  Diana Harmon MRN:  [id number] DOB:  1945-10-13   Subjective: Diana Harmon is an 77 y.o. year old female who is a primary patient of [name], DO.  The CCM team was consulted for assistance with disease management and care coordination needs.    Engaged with patient by telephone for follow up visit for pharmacy case management and/or care coordination services.   Objective:  Medications Reviewed Today     Reviewed by Rennis Petty, RPH-CPP (Pharmacist) on 06/21/22 at 1223  Med List Status: <None>   Medication Order Taking? Sig Documenting Provider Last Dose Status Informant  ACCU-CHEK GUIDE test strip [id number]  USE TO CHECK BLOOD SUGAR TWICE DAILY [name], DO  Active   Accu-Chek Softclix Lancets lancets [id number]  USE TO CHECK GLUCOSE TWICE DAILY Parks Ranger, Devonne Doughty, DO  Active   acetaminophen (TYLENOL) 500 MG tablet [id number]  Take 1,000 mg by mouth every 6 (six) hours as needed for moderate pain.  [provider]  Active Child  Artificial Tear Solution (GENTEAL TEARS) 0.1-0.2-0.3 % SOLN [id number]  Place 1 drop into both eyes 3 (three) times daily as needed (dry/irritated eyes.). [provider]  Active Child  aspirin 81 MG chewable tablet [id number]  Chew 1 tablet (81 mg total) by mouth daily. [name]  Active Child  B COMPLEX-C PO [id number]  Take 10 mLs by mouth daily. [provider]  Active Child  baclofen (LIORESAL) 10 MG tablet [id number]  TAKE 1 TO 2 TABLETS BY MOUTH TWICE DAILY AS NEEDED FOR MUSCLE SPASM Parks Ranger, Devonne Doughty, DO  Active   Blood Glucose Monitoring Suppl (ACCU-CHEK GUIDE) w/Device KIT [id number]  Use to check blood sugar twice per day [name], DO  Active   Cholecalciferol (VITAMIN D3) 125 MCG (5000 UT) TABS [id number]  Take 5,000 Units by mouth daily. [provider]   Active Child    Discontinued 09/05/19 1316   cyanocobalamin 1000 MCG tablet [id number]  Take by mouth every other day. [provider]  Active            Med Note Drema Dallas, Virgina Jock   Fri May 24, 2022 10:29 AM) [name] every 3 days  Ferrous Sulfate (IRON) 325 (65 Fe) MG TABS [id number]  TAKE 1 TABLET BY MOUTH TWICE DAILY WITH A MEAL [name], Devonne Doughty, DO  Active Child  furosemide (LASIX) 20 MG tablet [id number] Yes Take 1 tablet by mouth once daily  Patient taking differently: Take 20 mg by mouth daily as needed.   End, Harrell Gave, MD Taking Active   hydrochlorothiazide (HYDRODIURIL) 25 MG tablet [id number] Yes Take 1 tablet by mouth once daily [name], MD Taking Active   hydroxychloroquine (PLAQUENIL) 200 MG tablet [id number]  Take 1 tablet by mouth 2 (two) times daily. [provider]  Active   lactobacillus acidophilus (BACID) TABS tablet [id number]  Take 1 tablet by mouth daily as needed (digestive health.). [provider]  Active            Med Note Nat Christen   Wed Nov 03, 2019  3:06 PM)    Lancets Misc. KIT [id number]  Use to check blood sugar twice daily [provider]  Active   levocetirizine (XYZAL) 5 MG tablet [id number]  TAKE 1 TABLET BY MOUTH ONCE DAILY IN THE EVENING  Patient taking differently: Take 1/2 tablet (2.5 mg) daily in the evening  as needed for allergies   [name], DO  Active   losartan (COZAAR) 50 MG tablet [id number] Yes Take 1 tablet by mouth once daily [name], MD Taking Active   magnesium oxide (MAG-OX) 400 MG tablet [id number]  Take 400 mg by mouth daily. [provider]  Active   mupirocin ointment (BACTROBAN) 2 % [id number]  Apply 1 Application topically 2 (two) times daily.  Patient taking differently: Apply 1 Application topically 2 (two) times daily as needed.   [name], DO  Active   simvastatin (ZOCOR) 20 MG tablet [id number] Yes TAKE 1 TABLET BY MOUTH ONCE  DAILY AT  [name], DO Taking Active   TRULICITY A999333 0000000 SOPN [id number] Yes Inject 0.75 mg into the skin once a week. [name], DO Taking Active             Pertinent Labs:   Lab Results  Component Value Date   HGBA1C 6.7 (H) 04/24/2022   Lab Results  Component Value Date   CHOL 159 04/24/2022   HDL 76 04/24/2022   LDLCALC 67 04/24/2022   TRIG 77 04/24/2022   CHOLHDL 2.1 04/24/2022   Lab Results  Component Value Date   CREATININE 1.44 (H) 04/24/2022   BUN 34 (H) 04/24/2022   NA 145 04/24/2022   K 4.0 04/24/2022   CL 105 04/24/2022   CO2 28 04/24/2022   BP Readings from Last 3 Encounters:  06/04/22 124/62  05/01/22 138/68  01/11/22 (!) 127/55   Pulse Readings from Last 3 Encounters:  06/04/22 90  05/01/22 79  01/11/22 78     SDOH:  (Social Determinants of Health) assessments and interventions performed:  SDOH Interventions    Flowsheet Row Clinical Support from 05/24/2022 in [place] Management from 05/13/2022 in [place] Coordination from 01/21/2022 in [place] Coordination from 12/10/2021 in [place] from 05/18/2021 in [place] Management from 02/22/2021 in [facility]  SDOH Interventions        Food Insecurity Interventions Intervention Not Indicated Intervention Not Indicated Intervention Not Indicated Intervention Not Indicated Intervention Not Indicated Intervention Not Indicated  Housing Interventions Intervention Not Indicated Intervention Not Indicated Intervention Not Indicated Intervention Not Indicated Intervention Not Indicated Intervention Not Indicated  Transportation Interventions Intervention Not Indicated Intervention Not Indicated Intervention Not Indicated  Intervention Not Indicated Intervention Not Indicated Intervention Not Indicated  Utilities Interventions Intervention Not Indicated Intervention Not Indicated Intervention Not Indicated -- -- --  Alcohol Usage Interventions Intervention Not Indicated (Score <7) Intervention Not Indicated (Score <7) -- -- -- --  Financial Strain Interventions Intervention Not Indicated Intervention Not Indicated -- Intervention Not Indicated Intervention Not Indicated Intervention Not Indicated  Physical Activity Interventions Intervention Not Indicated Intervention Not Indicated -- Other (Comments)  [patient is working with specialist due to issues with muscles in neck and arm. She is limited in what she can do currently with being active] Intervention Not Indicated Other (Comments)  [The patient is doing exercises the PT has showed her]  Stress Interventions Intervention Not Indicated Intervention Not Indicated -- Intervention Not Indicated  [concerned over what is going on with her neck and arm muscles] Intervention Not Indicated Intervention Not Indicated  Social Connections Interventions Intervention Not Indicated Intervention Not Indicated -- Intervention Not Indicated Intervention Not  Indicated Other (Comment)  [good support system]       CCM Care Plan  Review of patient past medical history, allergies, medications, health status, including review of consultants reports, laboratory and other test data, was performed as part of comprehensive evaluation and provision of chronic care management services.   Care Plan : PharmD - Medication Assistance/Management  Updates made by Rennis Petty, RPH-CPP since 06/21/2022 12:00 AM     Problem: Disease Progression      Long-Range Goal: Disease Progression Prevented or Minimized   Start Date: 10/30/2020  Expected End Date: 01/28/2021  Recent Progress: On track  Priority: High  Note:   Current Barriers:  Unable to independently afford treatment  regimen Patient APPROVED for [facility] Patient Assistance Program for Trulicity, eligible through 05/06/2023  Pharmacist Clinical Goal(s):  Over the next 90 days, patient will verbalize ability to afford treatment regimen through collaboration with PharmD and provider.   Interventions: 1:1 collaboration with [name], DO regarding development and update of comprehensive plan of care as evidenced by provider attestation and co-signature Inter-disciplinary care team collaboration (see longitudinal plan of care) From review of chart, note patient seen by NP Webb Silversmith on 1/30 related to bladder pressure/dysuria. Provider ordered urine culture and advised patient to start course of Macrobid Per urine culture lab note from 2/1, provider advised patient to change antibiotic to Septra based on the culture sensitivity. Today patient reports completed course of Septra as prescribed and urinary symptoms resolved  Medication Assistance: Patient enrolled in [place] Patient Assistance Program for Trulicity through 0000000 Reports working with [place] to try to find any options for reducing cost of [place] Note based on reported income, patient would not qualify for Extra Help subsidy from Social Security Have counseled on requirements for the [place] patient assistance program through [employer] ([employer]). Note for Medicare patients, [employer] requires patient to meet 3% annual out of pocket expense requirement before would be eligible From review of [place] funds, note [place], Patient [place] funds are all currently closed Have encouraged patient to contact [place] to sign up for RA fund waitlist If cost unaffordable to patient, have encouraged her/daughter to follow up with Rheumatologist regarding alternative therapy options  T2DM: Control improved based on latest A1C reading; current  treatment: Trulicity A999333 mg weekly Previous therapies: metformin Reports recent blood sugar readings:  Morning fasting Blood Sugar After lunch After supper Notes  11 - February  208*  *dried fruit and nuts for lunch  12 - February 133 174    13 - February 109  237* *cabbage, roasted chicken,  a sweet potato  14 - February 98     15 - February 127  159   16 - February 102     Denies symptoms of hypoglycemia Cholesterol management: simvastatin 20 mg daily Exercise Reports exercise limited by arthritis, but currently exercising ~30 minutes everyday Continuing exercises from recent PT/OT and stretching at home Encourage patient to have regular well-balanced meals throughout the day, while controlling carbohydrate portion sizes Encourage patient to continue to use blood sugar checks as feedback on dietary choices Encourage patient to continue to monitor home blood sugar, keep log and bring record with her to medical appointments   HTN/Chronic HFpEF: Current treatment: HCTZ 25 mg daily Losartan 50 mg daily Furosemide 20 mg daily as needed for edema Note patient has home upper arm blood pressure monitor Reports recent home blood pressure  readings:  AM BP PM BP  11 - February 110/46, HR 85 130/60 HR 88  12 - February 113/48, HR 82 134/61, HR 79  13 - February 119/53, HR 82 135/63, HR 83  14 - February 148/72, HR 79 130/64, HR 80  15 - February 124/51, HR 77 125/51, HR 80  16 - February 126/56, HR 80    Reports continues to elevate legs and using compression socks during the day Have counseled on impact of salt/sodium in diet on blood pressure Encouraged patient to bring record of daily weights and home BP readings with her to medical appointments, but to call provider sooner if readings outside of established parameters  Patient Goals/Self-Care Activities Over the next 90 days, patient will:  - take medications as prescribed - check glucose, document, and provide at future  appointments - check blood pressure, document, and provide at future appointments - collaborate with provider on medication access solutions - attend medical appointments as scheduled         Plan: Telephone follow up appointment with care management team member scheduled for:  08/19/2022 at 10 am  Wallace Cullens, PharmD, [place], [place] [phone]

## 2022-06-21 NOTE — Progress Notes (Unsigned)
Cardiology Office Note:    Date:  06/24/2022   ID:  Diana Harmon, DOB [birth year], MRN [id number]  PCP:  [name], DO  [facility] Cardiologist:  Nelva Bush, MD  [facility] Electrophysiologist:  None   Referring MD: [name] *   Chief Complaint: 1 year follow-up  History of Present Illness:    Diana Harmon is a 77 y.o. female with a hx of chronic systolic CHF, RBBB, obesity, HTN, HLD, diabetes mellitus, thyroid disease, anemia on chronic iron infusions, [name], and severe aortic stenosis s/p TAVR (11/09/19) who presents to clinic for follow up.    Pt has a history of aortic stenosis that progressed to severe last year. Echocardiogram on 08/19/2019 showed an increase in the mean gradient to 32 mmHg with a peak gradient of 75 mmHg.  Aortic valve area had decreased to 0.85 cm consistent with severe aortic stenosis.  Left ventricular ejection fraction is 60 to 65%.  She underwent cardiac catheterization on 10/19/2019 showing no evidence of coronary disease.  The mean gradient by catheterization was 29 mmHg with an aortic valve area of 0.8 cm.   She was evaluated by the multidisciplinary valve team and underwent successful TAVR with a 26 mm Edwards Sapien 3 Ultra THV via the TF approach on 11/09/19. Post operative echo showed EF 65%, normally functioning TAVR with a mean gradient of 18 mm hg and no PVL. She was discharged on aspirin and palvix.    She was then readmitted from 7/7-7/10/21 for fever with SIRS. Primary source unknown. Work up was negative and she was treated for a UTI. 1 mont echo showed EF 65%, normally functioning TAVR with a mean gradient of 13 mmHg (improved from 18 mm hg) and no PVL.   The patient was seen by structural team 11/09/20 and reported occasional LLE on PRN lasix. Echo showed 60%, normally functioning TAVR with a mean gradient of 14 mm hg and no PVL.  Last seen 06/06/21 and reported lower leg edema  which improved with lasix. No changes were made.  Today, the patient is overall doing well. She denies chest pain or shortness of breath. She has occasional lower leg edema for which she takes lasix as needed. She is also on HCTZ. She uses a walker around the house. Vitals are good today. Labs from December 2023 were reviewed.    Past Medical History:  Diagnosis Date   Anemia    Arthritis    legs, knees, back, wrists   Diabetes mellitus without complication (HCC)    Hypertension    Nausea    Patient is [name]    S/P TAVR (transcatheter aortic valve replacement) 11/09/2019   s/p TAVR with a 26 mm Edwards S3U via the TF approach by Dr. Angelena Form & Bartle   Severe aortic stenosis    SIRS (systemic inflammatory response syndrome) ([place]) 11/2019   Sjogren's disease ([place])    Thyroid disease     Past Surgical History:  Procedure Laterality Date   CARDIAC CATHETERIZATION  10/19/2019   COLON SURGERY     caudarized polyps; rectal carcinoid excision ~ 2011   COLONOSCOPY     COLONOSCOPY WITH PROPOFOL N/A 03/13/2015   Procedure: COLONOSCOPY WITH PROPOFOL;  Surgeon: Manya Silvas, MD;  Location: [place];  Service: Endoscopy;  Laterality: N/A;   ESOPHAGOGASTRODUODENOSCOPY N/A 03/13/2015   Procedure: ESOPHAGOGASTRODUODENOSCOPY (EGD);  Surgeon: Manya Silvas, MD;  Location: [facility] ENDOSCOPY;  Service: Endoscopy;  Laterality: N/A;   INTRAOPERATIVE  TRANSTHORACIC ECHOCARDIOGRAM N/A 11/09/2019   Procedure: INTRAOPERATIVE TRANSTHORACIC ECHOCARDIOGRAM;  Surgeon: Burnell Blanks, MD;  Location: [place];  Service: Open Heart Surgery;  Laterality: N/A;   PARATHYROIDECTOMY  11/20/2004   right superior parathyroidecomy for primary hyperparathyroidism/parathyroid adenoma   RIGHT/LEFT HEART CATH AND CORONARY ANGIOGRAPHY N/A 10/19/2019   Procedure: RIGHT/LEFT HEART CATH AND CORONARY ANGIOGRAPHY;  Surgeon: Nelva Bush, MD;  Location: [place] [facility];  Service: Cardiovascular;   Laterality: N/A;   THYROIDECTOMY     TRANSCATHETER AORTIC VALVE REPLACEMENT, TRANSFEMORAL N/A 11/09/2019   Procedure: TRANSCATHETER AORTIC VALVE REPLACEMENT, TRANSFEMORAL;  Surgeon: Burnell Blanks, MD;  Location: [place];  Service: Open Heart Surgery;  Laterality: N/A;    Current Medications: Current Meds  Medication Sig   ACCU-CHEK GUIDE test strip USE TO CHECK BLOOD SUGAR TWICE DAILY   Accu-Chek Softclix Lancets lancets USE TO CHECK GLUCOSE TWICE DAILY   acetaminophen (TYLENOL) 500 MG tablet Take 1,000 mg by mouth every 6 (six) hours as needed for moderate pain.    Artificial Tear Solution (GENTEAL TEARS) 0.1-0.2-0.3 % SOLN Place 1 drop into both eyes 3 (three) times daily as needed (dry/irritated eyes.).   aspirin 81 MG chewable tablet Chew 1 tablet (81 mg total) by mouth daily.   B COMPLEX-C PO Take 10 mLs by mouth daily.   baclofen (LIORESAL) 10 MG tablet TAKE 1 TO 2 TABLETS BY MOUTH TWICE DAILY AS NEEDED FOR MUSCLE SPASM   Blood Glucose Monitoring Suppl (ACCU-CHEK GUIDE) w/Device KIT Use to check blood sugar twice per day   Cholecalciferol (VITAMIN D3) 125 MCG (5000 UT) TABS Take 5,000 Units by mouth daily.   cyanocobalamin 1000 MCG tablet Take by mouth every other day.   Ferrous Sulfate (IRON) 325 (65 Fe) MG TABS TAKE 1 TABLET BY MOUTH TWICE DAILY WITH A MEAL   furosemide (LASIX) 20 MG tablet Take 1 tablet by mouth once daily (Patient taking differently: Take 20 mg by mouth daily as needed.)   hydrochlorothiazide (HYDRODIURIL) 25 MG tablet Take 1 tablet by mouth once daily   hydroxychloroquine (PLAQUENIL) 200 MG tablet Take 1 tablet by mouth 2 (two) times daily.   lactobacillus acidophilus (BACID) TABS tablet Take 1 tablet by mouth daily as needed (digestive health.).   Lancets Misc. KIT Use to check blood sugar twice daily   levocetirizine (XYZAL) 5 MG tablet TAKE 1 TABLET BY MOUTH ONCE DAILY IN THE EVENING (Patient taking differently: Take 1/2 tablet (2.5 mg) daily in the  evening as needed for allergies)   losartan (COZAAR) 50 MG tablet Take 1 tablet by mouth once daily   magnesium oxide (MAG-OX) 400 MG tablet Take 400 mg by mouth daily.   mupirocin ointment (BACTROBAN) 2 % Apply 1 Application topically 2 (two) times daily. (Patient taking differently: Apply 1 Application topically 2 (two) times daily as needed.)   simvastatin (ZOCOR) 20 MG tablet TAKE 1 TABLET BY MOUTH ONCE DAILY AT  6PM   TRULICITY [id number] [id number] SOPN Inject 0.75 mg into the skin once a week.     Allergies:   Ferumoxytol, Penicillins, Iron, and Other   Social History   Socioeconomic History   Marital status: Divorced    Spouse name: Not on file   Number of children: 2   Years of education: 12   Highest education level: 12th grade  Occupational History   Occupation: retired-worked with nandicapped adults  Tobacco Use   Smoking status: Former    Years: 20.00    Types: Cigarettes  Quit date: 09/05/1979    Years since quitting: 42.8   Smokeless tobacco: Former  Scientific laboratory technician Use: Never used  Substance and Sexual Activity   Alcohol use: Yes    Alcohol/week: 0.0 - 1.0 standard drinks of alcohol    Comment: occ. wine   Drug use: No   Sexual activity: Not on file  Other Topics Concern   Not on file  Social History Narrative   Not on file   Social Determinants of Health   Financial Resource Strain: Low Risk  (05/24/2022)   Overall Financial Resource Strain (CARDIA)    Difficulty of Paying Living Expenses: Not hard at all  Food Insecurity: No Food Insecurity (05/24/2022)   Hunger Vital Sign    Worried About Running Out of Food in the Last Year: Never true    Ran Out of Food in the Last Year: Never true  Transportation Needs: No Transportation Needs (05/24/2022)   PRAPARE - Hydrologist (Medical): No    Lack of Transportation (Non-Medical): No  Physical Activity: Insufficiently Active (05/24/2022)   Exercise Vital Sign    Days of Exercise per  Week: 7 days    Minutes of Exercise per Session: 10 min  Stress: No Stress Concern Present (05/24/2022)   [place]    Feeling of Stress : Not at all  Social Connections: Socially Isolated (05/24/2022)   Social Connection and Isolation Panel [NHANES]    Frequency of Communication with Friends and Family: Twice a week    Frequency of Social Gatherings with Friends and Family: Never    Attends Religious Services: More than 4 times per year    Active Member of [employer] or Organizations: No    Attends Archivist Meetings: Never    Marital Status: Divorced     Family History: The patient's family history includes Aneurysm in her son; Breast cancer in her maternal aunt; Heart disease in her father; Lupus in her sister and son; Parkinson's disease in her mother.  ROS:   Please see the history of present illness.     All other systems reviewed and are negative.  EKGs/Labs/Other Studies Reviewed:    The following studies were reviewed today:  Echo 11/2020 1. 26 mm S3 (11/09/2019). V max 2.5 m/s, MG 14 mmHG, EOA 1.88 cm2, DI 0.45.  No regurgitation or PVL. Normal prosthesis. The aortic valve has been  repaired/replaced. Aortic valve regurgitation is not visualized. There is  a 26 mm Sapien prosthetic (TAVR)  valve present in the aortic position. Procedure Date: 11/09/2019. Echo  findings are consistent with normal structure and function of the aortic  valve prosthesis.   2. Left ventricular ejection fraction, by estimation, is 60 to 65%. The  left ventricle has normal function. The left ventricle has no regional  wall motion abnormalities. There is mild asymmetric left ventricular  hypertrophy of the basal-septal segment.  Left ventricular diastolic parameters are consistent with Grade I  diastolic dysfunction (impaired relaxation).   3. Right ventricular systolic function is normal. The right ventricular  size is  normal. There is normal pulmonary artery systolic pressure. The  estimated right ventricular systolic pressure is 123XX123 mmHg.   4. The mitral valve is grossly normal. No evidence of mitral valve  regurgitation. No evidence of mitral stenosis.   5. The inferior vena cava is normal in size with greater than 50%  respiratory variability, suggesting right atrial pressure  of 3 mmHg.   Comparison(s): No significant change from prior study.   EKG:  EKG is ordered today.  The ekg ordered today demonstrates NSR 82bpm, Lad, RBBB, no changes  Recent Labs: 04/24/2022: ALT 14; BUN 34; Creat 1.44; Hemoglobin 10.9; Platelets 162; Potassium 4.0; Sodium 145; TSH 2.36  Recent Lipid Panel    Component Value Date/Time   CHOL 159 04/24/2022 0812   CHOL 170 05/03/2020 0935   TRIG 77 04/24/2022 0812   HDL 76 04/24/2022 0812   HDL 54 05/03/2020 0935   CHOLHDL 2.1 04/24/2022 0812   VLDL 26 06/03/2016 0001   LDLCALC 67 04/24/2022 0812     Physical Exam:    VS:  BP 131/72 (BP Location: Left Arm, Patient Position: Sitting, Cuff Size: Large)   Pulse 85   Ht 5' (1.524 m)   Wt 242 lb 9.6 oz (110 kg)   SpO2 97%   BMI 47.38 kg/m     Wt Readings from Last 3 Encounters:  06/24/22 242 lb 9.6 oz (110 kg)  06/04/22 241 lb (109.3 kg)  05/24/22 243 lb (110.2 kg)     GEN:  Well nourished, well developed in no acute distress HEENT: Normal NECK: No JVD; No carotid bruits LYMPHATICS: No lymphadenopathy CARDIAC: RRR, no murmurs, rubs, gallops RESPIRATORY:  Clear to auscultation without rales, wheezing or rhonchi  ABDOMEN: Soft, non-tender, non-distended MUSCULOSKELETAL:  No edema; No deformity  SKIN: Warm and dry NEUROLOGIC:  Alert and oriented x 3 PSYCHIATRIC:  Normal affect   ASSESSMENT:    1. S/P TAVR (transcatheter aortic valve replacement)   2. Severe aortic stenosis   3. Chronic diastolic heart failure ([place])   4. Essential hypertension   5. Hyperlipidemia associated with type 2 diabetes mellitus  ([place])    PLAN:    In order of problems listed above:  Severe AS s/p TAVR The patient underwent TAVR placement 11/2019. Last echo 11/2020 showed LVEF 60%, stable valve function with a mean gradient of 69mHg. No signs of decompensation noted. SBE prophylaxis needed with Keflex due to PCN allergy. Continue Aspirin. I will update an echo.   HTN BP is good today, continue HCTZ 244mdaily and Losartan 5073maily.   Chronic HFpEF The patient has occasional LLE, for which she takes lasix 38m13mN. She also takes HCTZ 25mg29mly. Patient is euvolemic on exam today. Continue Losartan and HCTZ. Update echo as above. Labs from December reviewed, PCP will continue to monitor kidney function.  HLD LDL 64 04/2022. Continue simvastatin 38mg 57my.   Disposition: Follow up in 1 year(s) with MD    Signed, Ajna Moors H FurtNinfa Meeker  06/24/2022 8:56 AM    [facility]

## 2022-06-24 ENCOUNTER — Encounter: Payer: Self-pay | Admitting: Medical

## 2022-06-24 ENCOUNTER — Ambulatory Visit: Payer: Medicare Other | Attending: Medical | Admitting: Medical

## 2022-06-24 ENCOUNTER — Telehealth: Payer: Self-pay | Admitting: *Deleted

## 2022-06-24 VITALS — BP 131/72 | HR 85 | Ht 60.0 in | Wt 242.6 lb

## 2022-06-24 DIAGNOSIS — I350 Nonrheumatic aortic (valve) stenosis: Secondary | ICD-10-CM

## 2022-06-24 DIAGNOSIS — E785 Hyperlipidemia, unspecified: Secondary | ICD-10-CM | POA: Diagnosis not present

## 2022-06-24 DIAGNOSIS — Z952 Presence of prosthetic heart valve: Secondary | ICD-10-CM

## 2022-06-24 DIAGNOSIS — I10 Essential (primary) hypertension: Secondary | ICD-10-CM | POA: Diagnosis not present

## 2022-06-24 DIAGNOSIS — I5032 Chronic diastolic (congestive) heart failure: Secondary | ICD-10-CM

## 2022-06-24 DIAGNOSIS — E1169 Type 2 diabetes mellitus with other specified complication: Secondary | ICD-10-CM

## 2022-06-24 NOTE — Telephone Encounter (Signed)
Contacted pt to let her know that Cadence Furth, [place] would like to order echocardiogram and have her schedule future appointment. No vm or answer unable to leave message to notify pt of change after her OV.  New AVS printed and mailed to pt.

## 2022-06-24 NOTE — Telephone Encounter (Signed)
Patient returned call. Advised of echo order. Patient states she will callback when she knows her daughters schedule to setup appointment.

## 2022-06-24 NOTE — Patient Instructions (Addendum)
Medication Instructions:   Your physician recommends that you continue on your current medications as directed. Please refer to the Current Medication list given to you today.  *If you need a refill on your cardiac medications before your next appointment, please call your pharmacy*   Lab Work:  NONE  If you have labs (blood work) drawn today and your tests are completely normal, you will receive your results only by: [place] (if you have MyChart) OR A paper copy in the mail If you have any lab test that is abnormal or we need to change your treatment, we will call you to review the results.   Testing/Procedures:  Your physician has requested that you have an echocardiogram. Echocardiography is a painless test that uses sound waves to create images of your heart. It provides your doctor with information about the size and shape of your heart and how well your heart's chambers and valves are working. This procedure takes approximately one hour. There are no restrictions for this procedure. Please do NOT wear cologne, perfume, aftershave, or lotions (deodorant is allowed). Please arrive 15 minutes prior to your appointment time.    Follow-Up: At [facility], you and your health needs are our priority.  As part of our continuing mission to provide you with exceptional heart care, we have created designated Provider Care Teams.  These Care Teams include your primary Cardiologist (physician) and Advanced Practice Providers (APPs -  Physician Assistants and Nurse Practitioners) who all work together to provide you with the care you need, when you need it.  We recommend signing up for the patient portal called "MyChart".  Sign up information is provided on this After Visit Summary.  MyChart is used to connect with patients for Virtual Visits (Telemedicine).  Patients are able to view lab/test results, encounter notes, upcoming appointments, etc.  Non-urgent messages can be sent  to your provider as well.   To learn more about what you can do with MyChart, go to [email or web address].    Your next appointment:   12 month(s)  Provider:   Nelva Bush, MD

## 2022-06-26 ENCOUNTER — Telehealth: Payer: Medicare Other

## 2022-07-04 DIAGNOSIS — I10 Essential (primary) hypertension: Secondary | ICD-10-CM

## 2022-07-04 DIAGNOSIS — E1169 Type 2 diabetes mellitus with other specified complication: Secondary | ICD-10-CM

## 2022-07-04 DIAGNOSIS — I5032 Chronic diastolic (congestive) heart failure: Secondary | ICD-10-CM

## 2022-07-08 ENCOUNTER — Ambulatory Visit (INDEPENDENT_AMBULATORY_CARE_PROVIDER_SITE_OTHER): Payer: Medicare Other

## 2022-07-08 ENCOUNTER — Telehealth: Payer: Medicare Other

## 2022-07-08 DIAGNOSIS — E1169 Type 2 diabetes mellitus with other specified complication: Secondary | ICD-10-CM

## 2022-07-08 DIAGNOSIS — I5032 Chronic diastolic (congestive) heart failure: Secondary | ICD-10-CM

## 2022-07-08 NOTE — Chronic Care Management (AMB) (Signed)
Chronic Care Management   CCM RN Visit Note  07/08/2022 Name: Diana Harmon MRN: [id number] DOB: July 20, 1945  Subjective: Diana Harmon is a 77 y.o. year old female who is a primary care patient of [name], DO. The patient was referred to the Chronic Care Management team for assistance with care management needs subsequent to provider initiation of CCM services and plan of care.    Today's Visit:  Engaged with patient by telephone for follow up visit.        Goals Addressed             This Visit's Progress    CCM Expected Outcome:  Monitor, Self-Manage and Reduce Symptoms of Diabetes       Current Barriers:  Chronic Disease Management support and education needs related to effective management of DM Lab Results  Component Value Date   HGBA1C 6.7 (H) 04/24/2022     Planned Interventions: Provided education to patient about basic DM disease process. Review of goals of A1C. Patient with elevation from last A1C. ; Reviewed medications with patient and discussed importance of medication adherence. The patient is compliant with medications. Also works with pharm D for medications management. States that Trulicity is working well for her. She is happy that she does not have any side effects;        Reviewed prescribed diet with patient heart healthy/ADA diet. Review of dietary constraints and the patient states it does get frustrating as she cannot eat the things she wants to eat but she is doing better than what she was with her eating.   She states that she had a couple of high readings last week in the 140's but it was due to food choices. The patient states that she is eating more salads now and drinking more water. Has seen big improvements in her dietary habits. ; Counseled on importance of regular laboratory monitoring as prescribed. Has regular lab work. ;        Discussed plans with patient for ongoing care management follow up and provided  patient with direct contact information for care management team;      Provided patient with written educational materials related to hypo and hyperglycemia and importance of correct treatment. Denies any extreme highs or lows;       Reviewed scheduled/upcoming provider appointments including: Saw pcp on 06-04-2022 for a UTI, next appointment on 11-06-2022 Advised patient, providing education and rationale, to check cbg when you have symptoms of low or high blood sugar and as directed   and record. The patient keeps a record of her blood sugars. The patient states her blood sugar this am was 117. She states that she had a couple of readings of 140 last week but it is back down now.  Review of goal of fasting blood sugars <130 and post prandial of <180. Education provided.        call provider for findings outside established parameters;       Referral made to pharmacy team for assistance with ongoing support and education for effective management of medications. The patient works with the [facility] on a regular basis for effective management of medications. ;       Review of patient status, including review of consultants reports, relevant laboratory and other test results, and medications completed;       Advised patient to discuss changes or concerns about DM and DM health and well being with provider;  Screening for signs and symptoms of depression related to chronic disease state;        Assessed social determinant of health barriers;         Symptom Management: Take medications as prescribed   Attend all scheduled provider appointments Call provider office for new concerns or questions  call the Suicide and Crisis Lifeline: 988 call the [place] National Suicide Prevention Lifeline: 727-879-4605 or TTY: (780)618-9081 TTY 224-744-6697) to talk to a trained counselor call 1-800-273-TALK (toll free, 24 hour hotline) if experiencing a Mental Health or [place]  schedule appointment  with eye doctor check feet daily for cuts, sores or redness trim toenails straight across wash and dry feet carefully every day wear comfortable, cotton socks wear comfortable, well-fitting shoes  Follow Up Plan: Telephone follow up appointment with care management team member scheduled for: 09-09-2022 at 1 pm       CCM Expected Outcome:  Monitor, Self-Manage and Reduce Symptoms of Heart Failure       Current Barriers:  Knowledge Deficits related to changes in fluid balance with CHF  Chronic Disease Management support and education needs related to effective management of CHF EF is 60 to 65% on 11-08-2020, new Echo scheduled for 08-07-2022  Wt Readings from Last 3 Encounters:  06/24/22 242 lb 9.6 oz (110 kg)  06/04/22 241 lb (109.3 kg)  05/24/22 243 lb (110.2 kg)     BP Readings from Last 3 Encounters:  06/24/22 131/72  06/04/22 124/62  05/01/22 138/68     Planned Interventions: Basic overview and discussion of pathophysiology of Heart Failure reviewed. Review. The patient feels her HF is stable at this time. The patient denies any swelling or edema. She has upcoming appointments with the cardiologist and will have a new Echocardiogram on 08-07-2022. The patient denies any acute findings. Education on last recorded EF% and what that means. Provided education on low sodium diet. Review of Heart healthy/ADA diet. Education to monitor for changes in water weight versus actual weight. The patient denies any acute changes or shifts in her weight. Education provided.  Reviewed Heart Failure Action Plan in depth  Assessed need for readable accurate scales in home. The patient has scales and can safely weigh Provided education about placing scale on hard, flat surface Advised patient to weigh each morning after emptying bladder Discussed importance of daily weight and advised patient to weigh and record daily. The patient has been having to take her Lasix more consistently due to increased  swelling and edema. Encouraged the patient to monitor her sodium intake. She also is elevating her feet and legs. Senies any acute changes in her swelling or edema.Education and support given Reviewed role of diuretics in prevention of fluid overload and management of heart failure. Is taking Lasix daily currently. Knows when she does not have swelling in her ankles to take only as needed. States she has been taking her Lasix daily the last few days due to swelling. Education on monitoring for dehydration. Discussed the importance of keeping all appointments with provider. Sees pcp in July of 2024. Echocardiogram coming up on 08-07-2022 Provided patient with education about the role of exercise in the management of heart failure Advised patient to discuss changes in water weight, questions or concerns about HF or heart health with provider Screening for signs and symptoms of depression related to chronic disease state  Assessed social determinant of health barriers   Symptom Management: Take medications as prescribed   Attend all scheduled provider  appointments Call pharmacy for medication refills 3-7 days in advance of running out of medications Call provider office for new concerns or questions  call the Suicide and Crisis Lifeline: 988 call the [place] National Suicide Prevention Lifeline: (804)368-3579 or TTY: 515-872-1517 TTY (385) 654-1732) to talk to a trained counselor call 1-800-273-TALK (toll free, 24 hour hotline) if experiencing a Mental Health or [place]  call office if I gain more than 2 pounds in one day or 5 pounds in one week keep legs up while sitting track weight in diary use salt in moderation watch for swelling in feet, ankles and legs every day weigh myself daily develop a rescue plan follow rescue plan if symptoms flare-up track symptoms and what helps feel better or worse dress right for the weather, hot or cold  Follow Up Plan: Telephone follow up  appointment with care management team member scheduled for: 09-09-2022 at 1 pm          Plan:Telephone follow up appointment with care management team member scheduled for:  09-09-2022 at 1 pm  Noreene Larsson RN, MSN, CCM RN Care Manager  Chronic Care Management Direct Number: [phone]

## 2022-07-08 NOTE — Patient Instructions (Signed)
Please call the care guide team at [phone] if you need to cancel or reschedule your appointment.   If you are experiencing a Mental Health or [place] or need someone to talk to, please call the Suicide and Crisis Lifeline: 988 call the [place] National Suicide Prevention Lifeline: (947)154-4471 or TTY: 807-780-9689 TTY 423-255-2381) to talk to a trained counselor call 1-800-273-TALK (toll free, 24 hour hotline)   Following is a copy of the CCM Program Consent:  CCM service includes personalized support from designated clinical staff supervised by the physician, including individualized plan of care and coordination with other care providers 24/7 contact phone numbers for assistance for urgent and routine care needs. Service will only be billed when office clinical staff spend 20 minutes or more in a month to coordinate care. Only one practitioner may furnish and bill the service in a calendar month. The patient may stop CCM services at amy time (effective at the end of the month) by phone call to the office staff. The patient will be responsible for cost sharing (co-pay) or up to 20% of the service fee (after annual deductible is met)  Following is a copy of your full provider care plan:   Goals Addressed             This Visit's Progress    CCM Expected Outcome:  Monitor, Self-Manage and Reduce Symptoms of Diabetes       Current Barriers:  Chronic Disease Management support and education needs related to effective management of DM Lab Results  Component Value Date   HGBA1C 6.7 (H) 04/24/2022     Planned Interventions: Provided education to patient about basic DM disease process. Review of goals of A1C. Patient with elevation from last A1C. ; Reviewed medications with patient and discussed importance of medication adherence. The patient is compliant with medications. Also works with pharm D for medications management. States that Trulicity is working well for her.  She is happy that she does not have any side effects;        Reviewed prescribed diet with patient heart healthy/ADA diet. Review of dietary constraints and the patient states it does get frustrating as she cannot eat the things she wants to eat but she is doing better than what she was with her eating.   She states that she had a couple of high readings last week in the 140's but it was due to food choices. The patient states that she is eating more salads now and drinking more water. Has seen big improvements in her dietary habits. ; Counseled on importance of regular laboratory monitoring as prescribed. Has regular lab work. ;        Discussed plans with patient for ongoing care management follow up and provided patient with direct contact information for care management team;      Provided patient with written educational materials related to hypo and hyperglycemia and importance of correct treatment. Denies any extreme highs or lows;       Reviewed scheduled/upcoming provider appointments including: Saw pcp on 06-04-2022 for a UTI, next appointment on 11-06-2022 Advised patient, providing education and rationale, to check cbg when you have symptoms of low or high blood sugar and as directed   and record. The patient keeps a record of her blood sugars. The patient states her blood sugar this am was 117. She states that she had a couple of readings of 140 last week but it is back down now.  Review of goal  of fasting blood sugars <130 and post prandial of <180. Education provided.        call provider for findings outside established parameters;       Referral made to pharmacy team for assistance with ongoing support and education for effective management of medications. The patient works with the [facility] on a regular basis for effective management of medications. ;       Review of patient status, including review of consultants reports, relevant laboratory and other test results, and medications completed;        Advised patient to discuss changes or concerns about DM and DM health and well being with provider;      Screening for signs and symptoms of depression related to chronic disease state;        Assessed social determinant of health barriers;         Symptom Management: Take medications as prescribed   Attend all scheduled provider appointments Call provider office for new concerns or questions  call the Suicide and Crisis Lifeline: 988 call the [place] National Suicide Prevention Lifeline: 614-278-3486 or TTY: 406-350-4927 TTY (351) 243-0116) to talk to a trained counselor call 1-800-273-TALK (toll free, 24 hour hotline) if experiencing a Mental Health or [place]  schedule appointment with eye doctor check feet daily for cuts, sores or redness trim toenails straight across wash and dry feet carefully every day wear comfortable, cotton socks wear comfortable, well-fitting shoes  Follow Up Plan: Telephone follow up appointment with care management team member scheduled for: 09-09-2022 at 1 pm       CCM Expected Outcome:  Monitor, Self-Manage and Reduce Symptoms of Heart Failure       Current Barriers:  Knowledge Deficits related to changes in fluid balance with CHF  Chronic Disease Management support and education needs related to effective management of CHF EF is 60 to 65% on 11-08-2020, new Echo scheduled for 08-07-2022  Wt Readings from Last 3 Encounters:  06/24/22 242 lb 9.6 oz (110 kg)  06/04/22 241 lb (109.3 kg)  05/24/22 243 lb (110.2 kg)     BP Readings from Last 3 Encounters:  06/24/22 131/72  06/04/22 124/62  05/01/22 138/68     Planned Interventions: Basic overview and discussion of pathophysiology of Heart Failure reviewed. Review. The patient feels her HF is stable at this time. The patient denies any swelling or edema. She has upcoming appointments with the cardiologist and will have a new Echocardiogram on 08-07-2022. The patient denies any acute  findings. Education on last recorded EF% and what that means. Provided education on low sodium diet. Review of Heart healthy/ADA diet. Education to monitor for changes in water weight versus actual weight. The patient denies any acute changes or shifts in her weight. Education provided.  Reviewed Heart Failure Action Plan in depth  Assessed need for readable accurate scales in home. The patient has scales and can safely weigh Provided education about placing scale on hard, flat surface Advised patient to weigh each morning after emptying bladder Discussed importance of daily weight and advised patient to weigh and record daily. The patient has been having to take her Lasix more consistently due to increased swelling and edema. Encouraged the patient to monitor her sodium intake. She also is elevating her feet and legs. Senies any acute changes in her swelling or edema.Education and support given Reviewed role of diuretics in prevention of fluid overload and management of heart failure. Is taking Lasix daily currently. Knows when she  does not have swelling in her ankles to take only as needed. States she has been taking her Lasix daily the last few days due to swelling. Education on monitoring for dehydration. Discussed the importance of keeping all appointments with provider. Sees pcp in July of 2024. Echocardiogram coming up on 08-07-2022 Provided patient with education about the role of exercise in the management of heart failure Advised patient to discuss changes in water weight, questions or concerns about HF or heart health with provider Screening for signs and symptoms of depression related to chronic disease state  Assessed social determinant of health barriers   Symptom Management: Take medications as prescribed   Attend all scheduled provider appointments Call pharmacy for medication refills 3-7 days in advance of running out of medications Call provider office for new concerns or questions   call the Suicide and Crisis Lifeline: 988 call the [place] National Suicide Prevention Lifeline: (973)402-3260 or TTY: 7098775129 TTY 507-218-3897) to talk to a trained counselor call 1-800-273-TALK (toll free, 24 hour hotline) if experiencing a Mental Health or [place]  call office if I gain more than 2 pounds in one day or 5 pounds in one week keep legs up while sitting track weight in diary use salt in moderation watch for swelling in feet, ankles and legs every day weigh myself daily develop a rescue plan follow rescue plan if symptoms flare-up track symptoms and what helps feel better or worse dress right for the weather, hot or cold  Follow Up Plan: Telephone follow up appointment with care management team member scheduled for: 09-09-2022 at 1 pm          Patient verbalizes understanding of instructions and care plan provided today and agrees to view in [place]. Active MyChart status and patient understanding of how to access instructions and care plan via MyChart confirmed with patient.     Telephone follow up appointment with care management team member scheduled for: 09-09-2022

## 2022-07-11 ENCOUNTER — Encounter: Payer: Self-pay | Admitting: Internal Medicine

## 2022-07-11 ENCOUNTER — Inpatient Hospital Stay: Payer: Medicare Other

## 2022-07-11 ENCOUNTER — Inpatient Hospital Stay: Payer: Medicare Other | Attending: Internal Medicine | Admitting: Internal Medicine

## 2022-07-11 VITALS — BP 142/81 | HR 77 | Temp 96.1°F | Resp 16 | Wt 240.7 lb

## 2022-07-11 DIAGNOSIS — D72819 Decreased white blood cell count, unspecified: Secondary | ICD-10-CM | POA: Diagnosis not present

## 2022-07-11 DIAGNOSIS — Z7985 Long-term (current) use of injectable non-insulin antidiabetic drugs: Secondary | ICD-10-CM | POA: Diagnosis not present

## 2022-07-11 DIAGNOSIS — Z7982 Long term (current) use of aspirin: Secondary | ICD-10-CM | POA: Diagnosis not present

## 2022-07-11 DIAGNOSIS — M3500 Sicca syndrome, unspecified: Secondary | ICD-10-CM | POA: Insufficient documentation

## 2022-07-11 DIAGNOSIS — E079 Disorder of thyroid, unspecified: Secondary | ICD-10-CM | POA: Insufficient documentation

## 2022-07-11 DIAGNOSIS — D509 Iron deficiency anemia, unspecified: Secondary | ICD-10-CM | POA: Insufficient documentation

## 2022-07-11 DIAGNOSIS — D649 Anemia, unspecified: Secondary | ICD-10-CM

## 2022-07-11 DIAGNOSIS — Z803 Family history of malignant neoplasm of breast: Secondary | ICD-10-CM | POA: Insufficient documentation

## 2022-07-11 DIAGNOSIS — N183 Chronic kidney disease, stage 3 unspecified: Secondary | ICD-10-CM | POA: Insufficient documentation

## 2022-07-11 DIAGNOSIS — E119 Type 2 diabetes mellitus without complications: Secondary | ICD-10-CM | POA: Diagnosis not present

## 2022-07-11 DIAGNOSIS — I10 Essential (primary) hypertension: Secondary | ICD-10-CM | POA: Insufficient documentation

## 2022-07-11 DIAGNOSIS — Z79899 Other long term (current) drug therapy: Secondary | ICD-10-CM | POA: Insufficient documentation

## 2022-07-11 DIAGNOSIS — Z87891 Personal history of nicotine dependence: Secondary | ICD-10-CM | POA: Insufficient documentation

## 2022-07-11 LAB — CBC WITH DIFFERENTIAL/PLATELET
Abs Immature Granulocytes: 0 10*3/uL (ref 0.00–0.07)
Basophils Absolute: 0 10*3/uL (ref 0.0–0.1)
Basophils Relative: 1 %
Eosinophils Absolute: 0.1 10*3/uL (ref 0.0–0.5)
Eosinophils Relative: 3 %
HCT: 33.4 % — ABNORMAL LOW (ref 36.0–46.0)
Hemoglobin: 10.3 g/dL — ABNORMAL LOW (ref 12.0–15.0)
Immature Granulocytes: 0 %
Lymphocytes Relative: 36 %
Lymphs Abs: 1.1 10*3/uL (ref 0.7–4.0)
MCH: 29.3 pg (ref 26.0–34.0)
MCHC: 30.8 g/dL (ref 30.0–36.0)
MCV: 95.2 fL (ref 80.0–100.0)
Monocytes Absolute: 0.4 10*3/uL (ref 0.1–1.0)
Monocytes Relative: 12 %
Neutro Abs: 1.4 10*3/uL — ABNORMAL LOW (ref 1.7–7.7)
Neutrophils Relative %: 48 %
Platelets: 165 10*3/uL (ref 150–400)
RBC: 3.51 MIL/uL — ABNORMAL LOW (ref 3.87–5.11)
RDW: 14.3 % (ref 11.5–15.5)
WBC: 3 10*3/uL — ABNORMAL LOW (ref 4.0–10.5)
nRBC: 0 % (ref 0.0–0.2)

## 2022-07-11 LAB — BASIC METABOLIC PANEL
Anion gap: 10 (ref 5–15)
BUN: 32 mg/dL — ABNORMAL HIGH (ref 8–23)
CO2: 26 mmol/L (ref 22–32)
Calcium: 9.1 mg/dL (ref 8.9–10.3)
Chloride: 105 mmol/L (ref 98–111)
Creatinine, Ser: 1.21 mg/dL — ABNORMAL HIGH (ref 0.44–1.00)
GFR, Estimated: 46 mL/min — ABNORMAL LOW (ref >60)
Glucose, Bld: 119 mg/dL — ABNORMAL HIGH (ref 70–99)
Potassium: 3.8 mmol/L (ref 3.5–5.1)
Sodium: 141 mmol/L (ref 135–145)

## 2022-07-11 LAB — FERRITIN: Ferritin: 44 ng/mL (ref 11–307)

## 2022-07-11 LAB — IRON AND TIBC
Iron: 73 ug/dL (ref 28–170)
Saturation Ratios: 19 % (ref 10.4–31.8)
TIBC: 378 ug/dL (ref 250–450)
UIBC: 305 ug/dL

## 2022-07-11 LAB — VITAMIN B12: Vitamin B-12: 1003 pg/mL — ABNORMAL HIGH (ref 180–914)

## 2022-07-11 NOTE — Progress Notes (Signed)
Patient denies new problems/concerns today.   °

## 2022-07-11 NOTE — Progress Notes (Signed)
[place] NOTE  Patient Care Team: [name], DO as PCP - General (Family Medicine) End, Harrell Gave, MD as PCP - Cardiology (Cardiology) Cammie Sickle, MD as Consulting Physician (Internal Medicine) Lorelee Cover., MD (Ophthalmology) End, Harrell Gave, MD as Consulting Physician (Cardiology) Vanita Ingles, RN as Case Manager (General Practice) Cammie Sickle, MD as Consulting Physician (Internal Medicine) Curley Spice Virl [name], RPH-CPP as Pharmacist  CHIEF COMPLAINTS/PURPOSE OF CONSULTATION:   # AUG 2016- IRON DEFICIENCY ANEMIA-  Possible AVMs of colon [s/p Argon laser; Dr.Elliot;EGD-neg Nov 2016] s/p IV Ferriheme x2 [id number]; ALLERGY]; Venofer IV [pre-meds]; worsening anemia not responding to iron- November 2020-bone marrow biopsy-erythroid hyperplasia but no dysplasia no blasts. NOV 2020- CT A/P-negative; CXR-NEG.  #Relative B12 deficiency-sublingual B12 [Jan 2021]  #December 2020 -elevated anti-CCP- [s/p evaluation Dr. Elder Negus  # MILD LEUCOPENIA/NEUTROPENIA [facility] 1.2-1.3]-chronic ethnic neutropenia/asymptomatic  # Rectal well diff carcinoid [1.2CM; s/p polypectomy 2011]; Allergy- [facility] Bouchard [Nov 2016]- neck/face swelling [improved with benadryl]; June 2021- TAVR for severe AS  HISTORY OF PRESENTING ILLNESS: Patient accompanied by daughter.  In a wheelchair.  Diana Harmon 77 y.o.  female is here for follow-up for anemia of unclear etiology is here for follow-up.  No new shortness of breath no cough.  No weight loss no blood in stools or black or stools.  Patient continues to be compliant with her iron tablets.  Review of Systems  Constitutional:  Negative for chills, diaphoresis, fever and weight loss.  HENT:  Negative for nosebleeds and sore throat.   Eyes:  Negative for double vision.  Respiratory:  Negative for cough, hemoptysis, sputum production, shortness of breath and wheezing.   Cardiovascular:   Negative for chest pain, palpitations, orthopnea and leg swelling.  Gastrointestinal:  Negative for abdominal pain, blood in stool, constipation, diarrhea, heartburn, melena, nausea and vomiting.  Genitourinary:  Negative for dysuria, frequency and urgency.  Musculoskeletal:  Positive for joint pain. Negative for back pain.  Skin: Negative.  Negative for itching and rash.  Neurological:  Negative for dizziness, tingling, focal weakness, weakness and headaches.  Endo/Heme/Allergies:  Does not bruise/bleed easily.  Psychiatric/Behavioral:  Negative for depression. The patient is not nervous/anxious and does not have insomnia.      MEDICAL HISTORY:  Past Medical History:  Diagnosis Date   Anemia    Arthritis    legs, knees, back, wrists   Diabetes mellitus without complication (HCC)    Hypertension    Nausea    Patient is [name]    S/P TAVR (transcatheter aortic valve replacement) 11/09/2019   s/p TAVR with a 26 mm Edwards S3U via the TF approach by Dr. Angelena Form & Bartle   Severe aortic stenosis    SIRS (systemic inflammatory response syndrome) ([place]) 11/2019   Sjogren's disease ([place])    Thyroid disease     SURGICAL HISTORY: Past Surgical History:  Procedure Laterality Date   CARDIAC CATHETERIZATION  10/19/2019   COLON SURGERY     caudarized polyps; rectal carcinoid excision ~ 2011   COLONOSCOPY     COLONOSCOPY WITH PROPOFOL N/A 03/13/2015   Procedure: COLONOSCOPY WITH PROPOFOL;  Surgeon: Manya Silvas, MD;  Location: [facility];  Service: Endoscopy;  Laterality: N/A;   ESOPHAGOGASTRODUODENOSCOPY N/A 03/13/2015   Procedure: ESOPHAGOGASTRODUODENOSCOPY (EGD);  Surgeon: Manya Silvas, MD;  Location[facility]COPY;  Service: Endoscopy;  Laterality: N/A;   INTRAOPERATIVE TRANSTHORACIC ECHOCARDIOGRAM N/A 11/09/2019   Procedure: INTRAOPERATIVE TRANSTHORACIC ECHOCARDIOGRAM;  Surgeon: Burnell Blanks, MD;  Location:  [facility] OR;  Service: Open Heart Surgery;   Laterality: N/A;   PARATHYROIDECTOMY  11/20/2004   right superior parathyroidecomy for primary hyperparathyroidism/parathyroid adenoma   RIGHT/LEFT HEART CATH AND CORONARY ANGIOGRAPHY N/A 10/19/2019   Procedure: RIGHT/LEFT HEART CATH AND CORONARY ANGIOGRAPHY;  Surgeon: Nelva Bush, MD;  Location: [facility];  Service: Cardiovascular;  Laterality: N/A;   THYROIDECTOMY     TRANSCATHETER AORTIC VALVE REPLACEMENT, TRANSFEMORAL N/A 11/09/2019   Procedure: TRANSCATHETER AORTIC VALVE REPLACEMENT, TRANSFEMORAL;  Surgeon: Burnell Blanks, MD;  Location: [facility];  Service: Open Heart Surgery;  Laterality: N/A;    SOCIAL HISTORY: Social History   Socioeconomic History   Marital status: Divorced    Spouse name: Not on file   Number of children: 2   Years of education: 12   Highest education level: 12th grade  Occupational History   Occupation: retired-worked with nandicapped adults  Tobacco Use   Smoking status: Former    Years: 20.00    Types: Cigarettes    Quit date: 09/05/1979    Years since quitting: 42.8   Smokeless tobacco: Former  Scientific laboratory technician Use: Never used  Substance and Sexual Activity   Alcohol use: Yes    Alcohol/week: 0.0 - 1.0 standard drinks of alcohol    Comment: occ. wine   Drug use: No   Sexual activity: Not on file  Other Topics Concern   Not on file  Social History Narrative   Not on file   Social Determinants of Health   Financial Resource Strain: Low Risk  (05/24/2022)   Overall Financial Resource Strain (CARDIA)    Difficulty of Paying Living Expenses: Not hard at all  Food Insecurity: No Food Insecurity (05/24/2022)   Hunger Vital Sign    Worried About Running Out of Food in the Last Year: Never true    Ran Out of Food in the Last Year: Never true  Transportation Needs: No Transportation Needs (05/24/2022)   PRAPARE - Hydrologist (Medical): No    Lack of Transportation (Non-Medical): No  Physical  Activity: Insufficiently Active (05/24/2022)   Exercise Vital Sign    Days of Exercise per Week: 7 days    Minutes of Exercise per Session: 10 min  Stress: No Stress Concern Present (05/24/2022)   [place]    Feeling of Stress : Not at all  Social Connections: Socially Isolated (05/24/2022)   Social Connection and Isolation Panel [NHANES]    Frequency of Communication with Friends and Family: Twice a week    Frequency of Social Gatherings with Friends and Family: Never    Attends Religious Services: More than 4 times per year    Active Member of [employer] or Organizations: No    Attends Archivist Meetings: Never    Marital Status: Divorced  Human resources officer Violence: Not At Risk (05/24/2022)   Humiliation, Afraid, Rape, and Kick questionnaire    Fear of Current or Ex-Partner: No    Emotionally Abused: No    Physically Abused: No    Sexually Abused: No    FAMILY HISTORY: Family History  Problem Relation Age of Onset   Parkinson's disease Mother    Heart disease Father    Lupus Sister    Aneurysm Son    Lupus Son    Breast cancer Maternal Aunt        mat great aunt    ALLERGIES:  is allergic  to ferumoxytol, penicillins, iron, and other.  MEDICATIONS:  Current Outpatient Medications  Medication Sig Dispense Refill   ACCU-CHEK GUIDE test strip USE TO CHECK BLOOD SUGAR TWICE DAILY 200 each 1   Accu-Chek Softclix Lancets lancets USE TO CHECK GLUCOSE TWICE DAILY 200 each 1   acetaminophen (TYLENOL) 500 MG tablet Take 1,000 mg by mouth every 6 (six) hours as needed for moderate pain.      Artificial Tear Solution (GENTEAL TEARS) 0.1-0.2-0.3 % SOLN Place 1 drop into both eyes 3 (three) times daily as needed (dry/irritated eyes.).     aspirin 81 MG chewable tablet Chew 1 tablet (81 mg total) by mouth daily.     B COMPLEX-C PO Take 10 mLs by mouth daily.     baclofen (LIORESAL) 10 MG tablet TAKE 1 TO 2 TABLETS  BY MOUTH TWICE DAILY AS NEEDED FOR MUSCLE SPASM 60 tablet 5   Blood Glucose Monitoring Suppl (ACCU-CHEK GUIDE) w/Device KIT Use to check blood sugar twice per day 1 kit 0   Cholecalciferol (VITAMIN D3) 125 MCG (5000 UT) TABS Take 5,000 Units by mouth daily.     cyanocobalamin 1000 MCG tablet Take by mouth every other day.     Ferrous Sulfate (IRON) 325 (65 Fe) MG TABS TAKE 1 TABLET BY MOUTH TWICE DAILY WITH A MEAL 180 each 1   furosemide (LASIX) 20 MG tablet Take 1 tablet by mouth once daily (Patient taking differently: Take 20 mg by mouth daily as needed.) 90 tablet 1   hydrochlorothiazide (HYDRODIURIL) 25 MG tablet Take 1 tablet by mouth once daily 90 tablet 3   hydroxychloroquine (PLAQUENIL) 200 MG tablet Take 1 tablet by mouth 2 (two) times daily.     lactobacillus acidophilus (BACID) TABS tablet Take 1 tablet by mouth daily as needed (digestive health.).     Lancets Misc. KIT Use to check blood sugar twice daily     levocetirizine (XYZAL) 5 MG tablet TAKE 1 TABLET BY MOUTH ONCE DAILY IN THE EVENING (Patient taking differently: Take 1/2 tablet (2.5 mg) daily in the evening as needed for allergies) 90 tablet 3   losartan (COZAAR) 50 MG tablet Take 1 tablet by mouth once daily 90 tablet 3   magnesium oxide (MAG-OX) 400 MG tablet Take 400 mg by mouth daily.     mupirocin ointment (BACTROBAN) 2 % Apply 1 Application topically 2 (two) times daily. (Patient taking differently: Apply 1 Application topically 2 (two) times daily as needed.) 22 g 3   simvastatin (ZOCOR) 20 MG tablet TAKE 1 TABLET BY MOUTH ONCE DAILY AT  6PM 90 tablet 0   TRULICITY [id number] SOPN Inject 0.75 mg into the skin once a week. 12 mL 1   No current facility-administered medications for this visit.    Patient is obese.  PHYSICAL EXAMINATION: ECOG PERFORMANCE STATUS: 1 - Symptomatic but completely ambulatory  Vitals:   07/11/22 1000  BP: (!) 142/81  Pulse: 77  Resp: 16  Temp: (!) 96.1 F (35.6 C)  SpO2: 95%    Filed Weights   07/11/22 1000  Weight: 240 lb 11.2 oz (109.2 kg)    Physical Exam HENT:     Head: Normocephalic and atraumatic.     Mouth/Throat:     Pharynx: No oropharyngeal exudate.  Eyes:     Pupils: Pupils are equal, round, and reactive to light.  Cardiovascular:     Rate and Rhythm: Normal rate and regular rhythm.  Pulmonary:     Effort: Pulmonary effort is  normal. No respiratory distress.     Breath sounds: Normal breath sounds. No wheezing.  Abdominal:     General: Bowel sounds are normal. There is no distension.     Palpations: Abdomen is soft. There is no mass.     Tenderness: There is no abdominal tenderness. There is no guarding or rebound.  Musculoskeletal:        General: No tenderness. Normal range of motion.     Cervical back: Normal range of motion and neck supple.  Skin:    General: Skin is warm.  Neurological:     Mental Status: She is alert and oriented to person, place, and time.  Psychiatric:        Mood and Affect: Affect normal.     Latest Reference Range & Units 01/11/21 10:08  Iron 28 - 170 ug/dL 100  UIBC ug/dL 212  TIBC 250 - 450 ug/dL 312  Saturation Ratios 10.4 - 31.8 % 32 (H)  Ferritin 11 - 307 ng/mL 196  (H): Data is abnormally high  LABORATORY DATA:  I have reviewed the data as listed Lab Results  Component Value Date   WBC 3.0 (L) 07/11/2022   HGB 10.3 (L) 07/11/2022   HCT 33.4 (L) 07/11/2022   MCV 95.2 07/11/2022   PLT 165 07/11/2022   Recent Labs    11/15/21 1021 01/11/22 1032 04/24/22 0812 07/11/22 0941  NA 139 141 145 141  K 4.0 4.0 4.0 3.8  CL 106 108 105 105  CO2 '23 27 28 26  '$ GLUCOSE 157* 198* 148* 119*  BUN 30* 38* 34* 32*  CREATININE 1.22* 1.27* 1.44* 1.21*  CALCIUM 9.2 9.2 9.2 9.1  GFRNONAA 46* 44*  --  46*  PROT 7.9  --  6.8  --   ALBUMIN 3.4*  --   --   --   AST 32  --  21  --   ALT 20  --  14  --   ALKPHOS 62  --   --   --   BILITOT 0.5  --  0.4  --     ASSESSMENT & PLAN:  Normocytic  anemia #Normocytic anemia unclear etiology; previous history of AVM iron deficiency; September 2020-iron studies negative for deficiency.  Bone marrow biopsy negative for any significant dyspoiesis.  Cytogenetics normal.  # Today hemoglobin is 10-11-/? CKD vs others  Slightly low; otherwise fairly asymptomatic.Patient is on p.o. iron.  2024- sep iron saturation 23% ferritin 39  Iron studies pending today.  stable.                            # White count 3.0 chronic benign ethnic neutropenia-neutrophil count 1.6- stable.  # Relative B12 deficiency-200; March 2023- 5000+- continue sublingual B12 tablets 2-3 times a week. Await B12 level from today. stable.  #Chronic kidney disease-stage III mild [fu Nephrology-[facility]]-overall stable.  # DISPOSITION: # in 6 months-MD; labs- cbc/bmp/ iron studies/ferritin/ b12---Dr.B     Cammie Sickle, MD 07/11/2022 11:18 AM

## 2022-07-11 NOTE — Assessment & Plan Note (Signed)
#  Normocytic anemia unclear etiology; previous history of AVM iron deficiency; September 2020-iron studies negative for deficiency.  Bone marrow biopsy negative for any significant dyspoiesis.  Cytogenetics normal.  # Today hemoglobin is 10-11-/? CKD vs others  Slightly low; otherwise fairly asymptomatic.Patient is on p.o. iron.  2024- sep iron saturation 23% ferritin 39  Iron studies pending today.  stable.                            # White count 3.0 chronic benign ethnic neutropenia-neutrophil count 1.6- stable.  # Relative B12 deficiency-200; March 2023- 5000+- continue sublingual B12 tablets 2-3 times a week. Await B12 level from today. stable.  #Chronic kidney disease-stage III mild [fu Nephrology-[facility]]-overall stable.  # DISPOSITION: # in 6 months-MD; labs- cbc/bmp/ iron studies/ferritin/ b12---Dr.B

## 2022-08-04 DIAGNOSIS — E1159 Type 2 diabetes mellitus with other circulatory complications: Secondary | ICD-10-CM | POA: Diagnosis not present

## 2022-08-04 DIAGNOSIS — Z794 Long term (current) use of insulin: Secondary | ICD-10-CM

## 2022-08-04 DIAGNOSIS — I5030 Unspecified diastolic (congestive) heart failure: Secondary | ICD-10-CM

## 2022-08-07 ENCOUNTER — Ambulatory Visit: Payer: Medicare Other | Attending: Medical

## 2022-08-07 DIAGNOSIS — Z952 Presence of prosthetic heart valve: Secondary | ICD-10-CM | POA: Diagnosis not present

## 2022-08-07 LAB — ECHOCARDIOGRAM COMPLETE
AV Mean grad: 10 mmHg
AV Peak grad: 18 mmHg
Ao pk vel: 2.12 m/s
Area-P 1/2: 4.21 cm2
Calc EF: 57.1 %
S' Lateral: 2.2 cm
Single Plane A2C EF: 50.9 %
Single Plane A4C EF: 54.7 %

## 2022-08-13 ENCOUNTER — Encounter: Payer: Self-pay | Admitting: Family Medicine

## 2022-08-14 DIAGNOSIS — Z7960 Long term (current) use of unspecified immunomodulators and immunosuppressants: Secondary | ICD-10-CM | POA: Diagnosis not present

## 2022-08-14 DIAGNOSIS — M0609 Rheumatoid arthritis without rheumatoid factor, multiple sites: Secondary | ICD-10-CM | POA: Diagnosis not present

## 2022-08-14 DIAGNOSIS — M3505 Sjogren syndrome with inflammatory arthritis: Secondary | ICD-10-CM | POA: Diagnosis not present

## 2022-08-19 ENCOUNTER — Encounter: Payer: Self-pay | Admitting: Pharmacist

## 2022-08-19 ENCOUNTER — Other Ambulatory Visit: Payer: Self-pay | Admitting: Family Medicine

## 2022-08-19 ENCOUNTER — Ambulatory Visit (INDEPENDENT_AMBULATORY_CARE_PROVIDER_SITE_OTHER): Payer: Medicare Other | Admitting: Pharmacist

## 2022-08-19 DIAGNOSIS — E1169 Type 2 diabetes mellitus with other specified complication: Secondary | ICD-10-CM

## 2022-08-19 DIAGNOSIS — I5032 Chronic diastolic (congestive) heart failure: Secondary | ICD-10-CM

## 2022-08-19 DIAGNOSIS — M0609 Rheumatoid arthritis without rheumatoid factor, multiple sites: Secondary | ICD-10-CM

## 2022-08-19 DIAGNOSIS — I10 Essential (primary) hypertension: Secondary | ICD-10-CM

## 2022-08-19 NOTE — Chronic Care Management (AMB) (Signed)
Chronic Care Management CCM Pharmacy Note  08/19/2022 Name:  Diana Harmon MRN:  [id number] DOB:  1945-09-04  Subjective: Diana Harmon is an 77 y.o. year old female who is a primary patient of Smitty Cords, DO.  The CCM team was consulted for assistance with disease management and care coordination needs.    Engaged with patient/daughter by telephone for follow up visit for pharmacy case management and/or care coordination services.   Objective:  Medications Reviewed Today     Reviewed by Manuela Neptune, RPH-CPP (Pharmacist) on 08/19/22 at 1045  Med List Status: <None>   Medication Order Taking? Sig Documenting Provider Last Dose Status Informant  ACCU-CHEK GUIDE test strip [id number]  USE TO CHECK BLOOD SUGAR TWICE DAILY Smitty Cords, DO  Active   Accu-Chek Softclix Lancets lancets [id number]  USE TO CHECK GLUCOSE TWICE DAILY Althea Charon, Netta Neat, DO  Active   acetaminophen (TYLENOL) 500 MG tablet [id number]  Take 1,000 mg by mouth every 6 (six) hours as needed for moderate pain.  [provider]  Active Child  Artificial Tear Solution (GENTEAL TEARS) 0.1-0.2-0.3 % SOLN [id number]  Place 1 drop into both eyes 3 (three) times daily as needed (dry/irritated eyes.). [provider]  Active Child  aspirin 81 MG chewable tablet [id number]  Chew 1 tablet (81 mg total) by mouth daily. Allena Katz  Active Child  B COMPLEX-C PO [id number]  Take 10 mLs by mouth daily. [provider]  Active Child  baclofen (LIORESAL) 10 MG tablet [id number]  TAKE 1 TO 2 TABLETS BY MOUTH TWICE DAILY AS NEEDED FOR MUSCLE SPASM Althea Charon, Netta Neat, DO  Active   Blood Glucose Monitoring Suppl (ACCU-CHEK GUIDE) w/Device KIT [id number]  Use to check blood sugar twice per day Smitty Cords, DO  Active   Cholecalciferol (VITAMIN D3) 125 MCG (5000 UT) TABS [id number]  Take 5,000 Units by mouth daily. [provider]  Active Child    Discontinued 09/05/19 1316   cyanocobalamin 1000 MCG tablet [id number]  Take by mouth every other day. [provider]  Active            Med Note Zachery Dauer, Dorann Ou   Fri May 24, 2022 10:29 AM) Leanora Ivanoff every 3 days  Ferrous Sulfate (IRON) 325 (65 Fe) MG TABS [id number]  TAKE 1 TABLET BY MOUTH TWICE DAILY WITH A MEAL [name], Netta Neat, DO  Active Child  furosemide (LASIX) 20 MG tablet [id number] Yes Take 1 tablet by mouth once daily End, Christopher, MD Taking Active   hydrochlorothiazide (HYDRODIURIL) 25 MG tablet [id number] Yes Take 1 tablet by mouth once daily Lanier Prude, MD Taking Active   hydroxychloroquine (PLAQUENIL) 200 MG tablet [id number]  Take 1 tablet by mouth 2 (two) times daily. [provider]  Active   lactobacillus acidophilus (BACID) TABS tablet [id number]  Take 1 tablet by mouth daily as needed (digestive health.). [provider]  Active            Med Note Alphonzo Dublin   Wed Nov 03, 2019  3:06 PM)    Lancets Misc. KIT [id number]  Use to check blood sugar twice daily [provider]  Active   levocetirizine (XYZAL) 5 MG tablet [id number]  TAKE 1 TABLET BY MOUTH ONCE DAILY IN THE EVENING  Patient taking differently: Take 1/2 tablet (2.5 mg) daily in the evening as needed for allergies   Smitty Cords, DO  Active   losartan (  COZAAR) 50 MG tablet [id number] Yes Take 1 tablet by mouth once daily Lanier Prude, MD Taking Active   magnesium oxide (MAG-OX) 400 MG tablet [id number]  Take 400 mg by mouth daily. [provider]  Active   mupirocin ointment (BACTROBAN) 2 % [id number]  Apply 1 Application topically 2 (two) times daily.  Patient taking differently: Apply 1 Application topically 2 (two) times daily as needed.   Smitty Cords, DO  Active   simvastatin (ZOCOR) 20 MG tablet [id number]  TAKE 1 TABLET BY MOUTH ONCE DAILY AT  [address] DO  Active   TRULICITY 0.75  MG/0.5ML SOPN [id number] Yes Inject 0.75 mg into the skin once a week. Smitty Cords, DO Taking Active             Pertinent Labs:  Lab Results  Component Value Date   HGBA1C 6.7 (H) 04/24/2022   Lab Results  Component Value Date   CHOL 159 04/24/2022   HDL 76 04/24/2022   LDLCALC 67 04/24/2022   TRIG 77 04/24/2022   CHOLHDL 2.1 04/24/2022   Lab Results  Component Value Date   CREATININE 1.21 (H) 07/11/2022   BUN 32 (H) 07/11/2022   NA 141 07/11/2022   K 3.8 07/11/2022   CL 105 07/11/2022   CO2 26 07/11/2022   BP Readings from Last 3 Encounters:  07/11/22 (!) 142/81  06/24/22 131/72  06/04/22 124/62   Pulse Readings from Last 3 Encounters:  07/11/22 77  06/24/22 85  06/04/22 90     SDOH:  (Social Determinants of Health) assessments and interventions performed:  SDOH Interventions    Flowsheet Row Clinical Support from 05/24/2022 in [facility] Chronic Care Management from 05/13/2022 in [facility] Care Coordination from 01/21/2022 in [facility] Care Coordination from 12/10/2021 in [facility] Care Coordination Clinical Support from 05/18/2021 in [facility] Chronic Care Management from 02/22/2021 in [facility]  SDOH Interventions        Food Insecurity Interventions Intervention Not Indicated Intervention Not Indicated Intervention Not Indicated Intervention Not Indicated Intervention Not Indicated Intervention Not Indicated  Housing Interventions Intervention Not Indicated Intervention Not Indicated Intervention Not Indicated Intervention Not Indicated Intervention Not Indicated Intervention Not Indicated  Transportation Interventions Intervention Not Indicated Intervention Not Indicated Intervention Not Indicated Intervention Not Indicated Intervention Not Indicated Intervention Not  Indicated  Utilities Interventions Intervention Not Indicated Intervention Not Indicated Intervention Not Indicated -- -- --  Alcohol Usage Interventions Intervention Not Indicated (Score <7) Intervention Not Indicated (Score <7) -- -- -- --  Financial Strain Interventions Intervention Not Indicated Intervention Not Indicated -- Intervention Not Indicated Intervention Not Indicated Intervention Not Indicated  Physical Activity Interventions Intervention Not Indicated Intervention Not Indicated -- Other (Comments)  [patient is working with specialist due to issues with muscles in neck and arm. She is limited in what she can do currently with being active] Intervention Not Indicated Other (Comments)  [The patient is doing exercises the PT has showed her]  Stress Interventions Intervention Not Indicated Intervention Not Indicated -- Intervention Not Indicated  [concerned over what is going on with her neck and arm muscles] Intervention Not Indicated Intervention Not Indicated  Social Connections Interventions Intervention Not Indicated Intervention Not Indicated -- Intervention Not Indicated Intervention Not Indicated Other (Comment)  [good support system]       CCM Care Plan  Review of patient past medical history, allergies, medications, health status, including review of consultants reports, laboratory and other test data, was performed as part of comprehensive evaluation and provision of chronic care management services.   Care Plan : PharmD - Medication Assistance/Management  Updates made by Manuela Neptune, RPH-CPP since 08/19/2022 12:00 AM     Problem: Disease Progression      Long-Range Goal: Disease Progression Prevented or Minimized   Start Date: 10/30/2020  Expected End Date: 01/28/2021  Recent Progress: On track  Priority: High  Note:   Current Barriers:  Unable to independently afford treatment regimen Patient APPROVED for [facility] Patient Assistance Program for Trulicity,  eligible through 05/06/2023  Pharmacist Clinical Goal(s):  Over the next 90 days, patient will verbalize ability to afford treatment regimen through collaboration with PharmD and provider.   Interventions: 1:1 collaboration with Smitty Cords, DO regarding development and update of comprehensive plan of care as evidenced by provider attestation and co-signature Inter-disciplinary care team collaboration (see longitudinal plan of care)  Medication Assistance: Patient enrolled in [employer] Patient Assistance Program for Trulicity through 05/06/2023 Note cost of Orencia currently remains unaffordable through patient's health plan coverage Note based on reported income, patient would not qualify for Extra Help subsidy from Social Security Have counseled on requirements for the Orencia patient assistance program through [employer] ([employer]). Note for Medicare patients, [employer] requires patient to meet 3% annual out of pocket expense requirement before would be eligible From review of Rheumatoid Arthritis (RA) Foundation funds, note PAN Foundation, Patient [employer] and [employer] funds are all currently closed Again encourage patient to sign up for notifications/waitlists for these funds Today walk patient/daughter through sign up for [employer] RA fund waitlist  T2DM: Controlled based on latest A1C reading; current treatment: Trulicity 0.75 mg weekly Previous therapies: metformin Reports recent blood sugar readings:  Morning fasting Blood Sugar Bedtime  11 - April 138   12 - April 137 99  13 - April 129   14 - April  123  15 - April 135   Denies symptoms of hypoglycemia Cholesterol management: simvastatin 20 mg daily Exercise Reports exercise limited by arthritis - walks using walker consistently Continuing chair exercises from PT/OT and stretching at home ~30 minutes everyday Encourage patient to have regular well-balanced meals throughout the day, while  controlling carbohydrate portion sizes Encourage patient to continue to use blood sugar checks as feedback on dietary choices Encourage patient to continue to monitor home blood sugar, keep log and bring record with her to medical appointments   HTN/Chronic HFpEF: Current treatment: HCTZ 25 mg daily Losartan 50 mg daily Furosemide 20 mg daily Note patient has home upper arm blood pressure monitor Reports recent home blood pressure readings: 4/10: 117/50, HR 75 4/11: 114/48, HR 67 4/12: 122/52, HR 83 4/13: 126/56, HR 80 4/14: 125/51, HR 80 Reports continues to elevate legs and using compression socks during the day Counsel on impact of salt/sodium in diet on blood pressure Encouraged patient to bring record of daily weights and home BP readings with her to medical appointments, but to call provider sooner if readings outside of established parameters  Patient Goals/Self-Care Activities Over the next 90 days, patient will:  - take medications as prescribed - check glucose, document, and provide at future appointments - check blood pressure, document, and provide at future appointments - collaborate with provider on medication access solutions - attend medical appointments as scheduled  Plan: Telephone follow up appointment with care management team member scheduled for:  01/13/2023 at 10:00 AM   Estelle Grumbles, PharmD, Patsy [place], CPP Clinical Pharmacist [facility] [phone]

## 2022-08-19 NOTE — Patient Instructions (Signed)
Visit Information  Thank you for taking time to visit with me today. Please don't hesitate to contact me if I can be of assistance to you before our next scheduled telephone appointment.  Following are the goals we discussed today:   Goals Addressed             This Visit's Progress    Pharmacy Goals       Our goal A1c is less than 7%. This corresponds with fasting sugars less than 130 and 2 hour after meal sugars less than 180. Please keep a log of your results when checking your blood sugar  Our goal bad cholesterol, or LDL, is less than 70 . This is why it is important to continue taking your simvastatin  Please check your home blood pressure, keep a log of the results and bring this with you to your medical appointments.  Feel free to call me with any questions or concerns. I look forward to our next call!  Estelle Grumbles, PharmD, Patsy [place], CPP Clinical Pharmacist [facility] [phone]          Our next appointment is by telephone on 01/13/2023 at 10:00 AM   Please call the care guide team at [phone] if you need to cancel or reschedule your appointment.   Patient verbalizes understanding of instructions and care plan provided today and agrees to view in MyChart. Active MyChart status and patient understanding of how to access instructions and care plan via MyChart confirmed with patient.     Estelle Grumbles, PharmD, Patsy [place], CPP Clinical Pharmacist [facility] [phone]

## 2022-08-20 NOTE — Telephone Encounter (Signed)
Requested Prescriptions  Pending Prescriptions Disp Refills   ACCU-CHEK GUIDE test strip [Pharmacy Med Name: Accu-Chek Guide In Vitro Strip] 200 each 3    Sig: USE TO CHECK BLOOD SUGAR TWICE DAILY     Endocrinology: Diabetes - Testing Supplies Passed - 08/19/2022  6:51 AM      Passed - Valid encounter within last 12 months    Recent Outpatient Visits           2 months ago Dysuria   [place] [facility] [place], Salvadore [place], NP   3 months ago Annual physical exam   [place] [facility] Smitty Cords, DO   9 months ago Type 2 diabetes mellitus with other specified complication, without long-term current use of insulin [facility])   [place] [facility] [place], Netta Neat, DO   1 year ago Annual physical exam   [place] [facility] Smitty Cords, DO   1 year ago Type 2 diabetes mellitus with other specified complication, without long-term current use of insulin [facility])   [place] [facility] [place], Netta Neat, DO       Future Appointments             In 2 months Althea Charon, Netta Neat, DO [facility]             Accu-Chek Softclix Lancets lancets [employer] Med Name: SOFTCLIX LANCETS    MIS] 200 each 2    Sig: USE TO CHECK BLOOD GLUCOSE TWICE DAILY     Endocrinology: Diabetes - Testing Supplies Passed - 08/19/2022  6:51 AM      Passed - Valid encounter within last 12 months    Recent Outpatient Visits           2 months ago Dysuria   [place] [facility] [place], Salvadore Oxford, NP   3 months ago Annual physical exam   [place] [facility] Smitty Cords, DO   9 months ago Type 2 diabetes mellitus with other specified complication, without long-term current use of insulin [facility])   [place] [facility] [place], Netta Neat, DO   1 year ago Annual physical exam    [place] [facility] Smitty Cords, DO   1 year ago Type 2 diabetes mellitus with other specified complication, without long-term current use of insulin [facility])   [facility] [place], Netta Neat, DO       Future Appointments             In 2 months Althea Charon, Netta Neat, DO [facility], [facility]

## 2022-09-03 DIAGNOSIS — I5032 Chronic diastolic (congestive) heart failure: Secondary | ICD-10-CM

## 2022-09-03 DIAGNOSIS — I10 Essential (primary) hypertension: Secondary | ICD-10-CM

## 2022-09-03 DIAGNOSIS — E1169 Type 2 diabetes mellitus with other specified complication: Secondary | ICD-10-CM

## 2022-09-03 DIAGNOSIS — M0609 Rheumatoid arthritis without rheumatoid factor, multiple sites: Secondary | ICD-10-CM

## 2022-09-07 ENCOUNTER — Other Ambulatory Visit: Payer: Self-pay | Admitting: Family Medicine

## 2022-09-07 DIAGNOSIS — E7800 Pure hypercholesterolemia, unspecified: Secondary | ICD-10-CM

## 2022-09-09 ENCOUNTER — Ambulatory Visit (INDEPENDENT_AMBULATORY_CARE_PROVIDER_SITE_OTHER): Payer: Medicare Other

## 2022-09-09 ENCOUNTER — Telehealth: Payer: Medicare Other

## 2022-09-09 DIAGNOSIS — I5032 Chronic diastolic (congestive) heart failure: Secondary | ICD-10-CM

## 2022-09-09 DIAGNOSIS — I10 Essential (primary) hypertension: Secondary | ICD-10-CM

## 2022-09-09 DIAGNOSIS — E1169 Type 2 diabetes mellitus with other specified complication: Secondary | ICD-10-CM

## 2022-09-09 NOTE — Chronic Care Management (AMB) (Signed)
Chronic Care Management   CCM RN Visit Note  09/09/2022 Name: Diana Harmon MRN: [id number] DOB: 1946/01/06  Subjective: Diana Harmon is a 77 y.o. year old female who is a primary care patient of Smitty Cords, DO. The patient was referred to the Chronic Care Management team for assistance with care management needs subsequent to provider initiation of CCM services and plan of care.    Today's Visit:  Engaged with patient by telephone for follow up visit.        Goals Addressed             This Visit's Progress    CCM Expected Outcome:  Monitor, Self-Manage and Reduce Symptoms of Diabetes       Current Barriers:  Chronic Disease Management support and education needs related to effective management of DM Lab Results  Component Value Date   HGBA1C 6.7 (H) 04/24/2022     Planned Interventions: Provided education to patient about basic DM disease process. Review of goals of A1C. Patient with elevation from last A1C. Will have new blood work in June 2024; Reviewed medications with patient and discussed importance of medication adherence. The patient is compliant with medications. Also works with pharm D for medications management. States that Trulicity is working well for her. She is happy that she does not have any side effects. Has been approved through 2024 to get Trulicity;        Reviewed prescribed diet with patient heart healthy/ADA diet. Review of dietary constraints and the patient states it does get frustrating as she cannot eat the things she wants to eat but she is doing better than what she was with her eating.   The patient states that she is eating more salads now and drinking more water. Has seen big improvements in her dietary habits. Overall feels like she is well balanced with her dietary restrictions; Counseled on importance of regular laboratory monitoring as prescribed. Has regular lab work. ;        Discussed plans with patient for  ongoing care management follow up and provided patient with direct contact information for care management team;      Provided patient with written educational materials related to hypo and hyperglycemia and importance of correct treatment. Denies any extreme highs or lows;       Reviewed scheduled/upcoming provider appointments including:  Next pcp appointment on 11-06-2022 at 840 am, reminder provided today.   Advised patient, providing education and rationale, to check cbg when you have symptoms of low or high blood sugar and as directed   and record. The patient keeps a record of her blood sugars. The patient states her blood sugar this am was 126. Review of goal of fasting blood sugars <130 and post prandial of <180. Education provided.    09-01-2022 139  09-07-2022 133  09-08-2022 103  09-09-2022 126       call provider for findings outside established parameters;       Referral made to pharmacy team for assistance with ongoing support and education for effective management of medications. The patient works with the pharm D on a regular basis for effective management of medications. ;       Review of patient status, including review of consultants reports, relevant laboratory and other test results, and medications completed;       Advised patient to discuss changes or concerns about DM and DM health and well being with provider;      Screening  for signs and symptoms of depression related to chronic disease state;        Assessed social determinant of health barriers;         Symptom Management: Take medications as prescribed   Attend all scheduled provider appointments Call provider office for new concerns or questions  call the Suicide and Crisis Lifeline: 988 call the [place] National Suicide Prevention Lifeline: (762)882-8414 or TTY: (276)610-6327 TTY 806 216 3783) to talk to a trained counselor call 1-800-273-TALK (toll free, 24 hour hotline) if experiencing a Mental Health or Behavioral Health  Crisis  schedule appointment with eye doctor check feet daily for cuts, sores or redness trim toenails straight across wash and dry feet carefully every day wear comfortable, cotton socks wear comfortable, well-fitting shoes  Follow Up Plan: Telephone follow up appointment with care management team member scheduled for: 11-18-2022 at 1 pm       CCM Expected Outcome:  Monitor, Self-Manage and Reduce Symptoms of Heart Failure       Current Barriers:  Knowledge Deficits related to changes in fluid balance with CHF  Chronic Disease Management support and education needs related to effective management of CHF EF is 60 to 65% on 11-08-2020, new Echo scheduled for 08-07-2022  Wt Readings from Last 3 Encounters:  07/11/22 240 lb 11.2 oz (109.2 kg)  06/24/22 242 lb 9.6 oz (110 kg)  06/04/22 241 lb (109.3 kg)  Self reported weight of 235 to 240    BP Readings from Last 3 Encounters:  07/11/22 (!) 142/81  06/24/22 131/72  06/04/22 124/62     Planned Interventions: Basic overview and discussion of pathophysiology of Heart Failure reviewed. Review. The patient feels her HF is stable at this time. The patient denies any swelling or edema. She has upcoming appointments with the cardiologist and had a new Echocardiogram on 08-07-2022 with an EF% of 55-60%. The patient denies any acute findings. Provided education on low sodium diet. Review of Heart healthy/ADA diet. Education to monitor for changes in water weight versus actual weight. The patient denies any acute changes or shifts in her weight. Education provided. States her weight is 235 to 240 consistently. Reviewed Heart Failure Action Plan in depth  Assessed need for readable accurate scales in home. The patient has scales and can safely weigh Provided education about placing scale on hard, flat surface Advised patient to weigh each morning after emptying bladder Discussed importance of daily weight and advised patient to weigh and record daily.  The patient feels her swelling and edema has leveled out. She denies any acute water shifts. Is monitoring closely. States she is taking her medications as ordered. Reviewed role of diuretics in prevention of fluid overload and management of heart failure. Is taking Lasix daily currently. Knows when she does not have swelling in her ankles to take only as needed. States she has been taking her Lasix daily the last few days due to swelling. Education on monitoring for dehydration. Discussed the importance of keeping all appointments with provider. Sees pcp in July of 2024.  Provided patient with education about the role of exercise in the management of heart failure Advised patient to discuss changes in water weight, questions or concerns about HF or heart health with provider Screening for signs and symptoms of depression related to chronic disease state  Assessed social determinant of health barriers   Symptom Management: Take medications as prescribed   Attend all scheduled provider appointments Call pharmacy for medication refills 3-7 days in advance of  running out of medications Call provider office for new concerns or questions  call the Suicide and Crisis Lifeline: 988 call the [place] National Suicide Prevention Lifeline: 419-081-1736 or TTY: (843)053-4172 TTY (403)127-9123) to talk to a trained counselor call 1-800-273-TALK (toll free, 24 hour hotline) if experiencing a Mental Health or Behavioral Health Crisis  call office if I gain more than 2 pounds in one day or 5 pounds in one week keep legs up while sitting track weight in diary use salt in moderation watch for swelling in feet, ankles and legs every day weigh myself daily develop a rescue plan follow rescue plan if symptoms flare-up track symptoms and what helps feel better or worse dress right for the weather, hot or cold  Follow Up Plan: Telephone follow up appointment with care management team member scheduled for:  11-18-2022 at 1 pm       CCM Expected Outcome:  Monitor, Self-Manage, and Reduce Symptoms of Hypertension       Current Barriers:  Chronic Disease Management support and education needs related to effective management of HTN  BP Readings from Last 3 Encounters:  07/11/22 (!) 142/81  06/24/22 131/72  06/04/22 124/62    Planned Interventions: Evaluation of current treatment plan related to hypertension self management and patient's adherence to plan as established by provider. The patient states her blood pressures have been stable. States that her readings have been better and some readings with systolic of 106.  Provided education to patient re: stroke prevention, s/s of heart attack and stroke; Reviewed prescribed diet heart healthy/ADA diet. Review and discussed monitoring for hidden sodium and sugars in food and how to effectively balance food intake   Reviewed medications with patient and discussed importance of compliance. Is compliant with medications. Works with the pharm D on a regular basis for effective management of medications;  Discussed plans with patient for ongoing care management follow up and provided patient with direct contact information for care management team; Advised patient, providing education and rationale, to monitor blood pressure daily and record, calling PCP for findings outside established parameters. The patient states that her blood pressures have been good at home. The patient does have know "white coat syndrome" Reviewed scheduled/upcoming provider appointments including: 11-06-2022. Sees specialist on a regular basis. Advised patient to discuss changes in blood pressures and heart health with provider; Provided education on prescribed diet heart healthy/ADA diet. Education and review. Will send email information for healthy planning for meals ;  Discussed complications of poorly controlled blood pressure such as heart disease, stroke, circulatory  complications, vision complications, kidney impairment, sexual dysfunction;  Screening for signs and symptoms of depression related to chronic disease state;  Assessed social determinant of health barriers;  Was approved for Orencia for her RA and is happy about this. Takes an injection once a week and she can tell a positive difference in her RA pain. She is thankful for this and thankful for the support she gets from pharm D with cost constraints related to medications.   Symptom Management: Take medications as prescribed   Attend all scheduled provider appointments Call provider office for new concerns or questions  call the Suicide and Crisis Lifeline: 988 call the [place] National Suicide Prevention Lifeline: 803-090-7064 or TTY: 365-117-5195 TTY 340-539-3704) to talk to a trained counselor call 1-800-273-TALK (toll free, 24 hour hotline) if experiencing a Mental Health or Behavioral Health Crisis  check blood pressure 3 times per week choose a place to take my blood pressure (  home, clinic or office, retail store) write blood pressure results in a log or diary learn about high blood pressure keep a blood pressure log take blood pressure log to all doctor appointments call doctor for signs and symptoms of high blood pressure develop an action plan for high blood pressure keep all doctor appointments take medications for blood pressure exactly as prescribed report new symptoms to your doctor  Follow Up Plan: Telephone follow up appointment with care management team member scheduled for: 11-18-2022 at 1 pm          Plan:Telephone follow up appointment with care management team member scheduled for:  11-18-2022 at 1 pm  Alto Denver RN, MSN, CCM RN Care Manager  Chronic Care Management Direct Number: [phone]

## 2022-09-09 NOTE — Telephone Encounter (Signed)
Requested Prescriptions  Pending Prescriptions Disp Refills   simvastatin (ZOCOR) 20 MG tablet [Pharmacy Med Name: Simvastatin 20 MG Oral Tablet] 90 tablet 0    Sig: TAKE 1 TABLET BY MOUTH ONCE DAILY AT  6PM     Cardiovascular:  Antilipid - Statins Failed - 09/07/2022  6:52 AM      Failed - Lipid Panel in normal range within the last 12 months    Cholesterol, Total  Date Value Ref Range Status  05/03/2020 170 100 - 199 mg/dL Final   Cholesterol  Date Value Ref Range Status  04/24/2022 159 <200 mg/dL Final   LDL Cholesterol (Calc)  Date Value Ref Range Status  04/24/2022 67 mg/dL (calc) Final    Comment:    Reference range: <100 . Desirable range <100 mg/dL for primary prevention;   <70 mg/dL for patients with CHD or diabetic patients  with > or = 2 CHD risk factors. Marland Kitchen LDL-C is now calculated using the Martin-Hopkins  calculation, which is a validated novel method providing  better accuracy than the Friedewald equation in the  estimation of LDL-C.  Horald Pollen et al. Lenox Ahr. 1610;960(45): 2061-2068  (http://education.[facility].com/faq/FAQ164)    HDL  Date Value Ref Range Status  04/24/2022 76 > OR = 50 mg/dL Final  40/98/1191 54 >47 mg/dL Final   Triglycerides  Date Value Ref Range Status  04/24/2022 77 <150 mg/dL Final         Passed - Patient is not pregnant      Passed - Valid encounter within last 12 months    Recent Outpatient Visits           3 months ago Dysuria   [place] [facility] [place], Salvadore Oxford, NP   4 months ago Annual physical exam   [place] [facility] Smitty Cords, DO   10 months ago Type 2 diabetes mellitus with other specified complication, without long-term current use of insulin [facility])   [place] [facility] Smitty Cords, DO   1 year ago Annual physical exam   [place] [facility] Smitty Cords, DO   1 year ago Type 2 diabetes  mellitus with other specified complication, without long-term current use of insulin [facility])   [place] [facility] [place], Netta Neat, DO       Future Appointments             In 1 month Althea Charon, Netta Neat, DO [place] [facility], [facility]

## 2022-09-09 NOTE — Patient Instructions (Signed)
Please call the care guide team at [phone] if you need to cancel or reschedule your appointment.   If you are experiencing a Mental Health or Behavioral Health Crisis or need someone to talk to, please call the Suicide and Crisis Lifeline: 988 call the [place] National Suicide Prevention Lifeline: 9157521633 or TTY: 873-424-2619 TTY [phone]) to talk to a trained counselor call 1-800-273-TALK (toll free, 24 hour hotline)   Following is a copy of the CCM Program Consent:  CCM service includes personalized support from designated clinical staff supervised by the physician, including individualized plan of care and coordination with other care providers 24/7 contact phone numbers for assistance for urgent and routine care needs. Service will only be billed when office clinical staff spend 20 minutes or more in a month to coordinate care. Only one practitioner may furnish and bill the service in a calendar month. The patient may stop CCM services at amy time (effective at the end of the month) by phone call to the office staff. The patient will be responsible for cost sharing (co-pay) or up to 20% of the service fee (after annual deductible is met)  Following is a copy of your full provider care plan:   Goals Addressed             This Visit's Progress    CCM Expected Outcome:  Monitor, Self-Manage and Reduce Symptoms of Diabetes       Current Barriers:  Chronic Disease Management support and education needs related to effective management of DM Lab Results  Component Value Date   HGBA1C 6.7 (H) 04/24/2022     Planned Interventions: Provided education to patient about basic DM disease process. Review of goals of A1C. Patient with elevation from last A1C. Will have new blood work in June 2024; Reviewed medications with patient and discussed importance of medication adherence. The patient is compliant with medications. Also works with pharm D for medications management. States  that Trulicity is working well for her. She is happy that she does not have any side effects. Has been approved through 2024 to get Trulicity;        Reviewed prescribed diet with patient heart healthy/ADA diet. Review of dietary constraints and the patient states it does get frustrating as she cannot eat the things she wants to eat but she is doing better than what she was with her eating.   The patient states that she is eating more salads now and drinking more water. Has seen big improvements in her dietary habits. Overall feels like she is well balanced with her dietary restrictions; Counseled on importance of regular laboratory monitoring as prescribed. Has regular lab work. ;        Discussed plans with patient for ongoing care management follow up and provided patient with direct contact information for care management team;      Provided patient with written educational materials related to hypo and hyperglycemia and importance of correct treatment. Denies any extreme highs or lows;       Reviewed scheduled/upcoming provider appointments including:  Next pcp appointment on 11-06-2022 at 840 am, reminder provided today.   Advised patient, providing education and rationale, to check cbg when you have symptoms of low or high blood sugar and as directed   and record. The patient keeps a record of her blood sugars. The patient states her blood sugar this am was 126. Review of goal of fasting blood sugars <130 and post prandial of <180. Education provided.  09-01-2022 139  09-07-2022 133  09-08-2022 103  09-09-2022 126       call provider for findings outside established parameters;       Referral made to pharmacy team for assistance with ongoing support and education for effective management of medications. The patient works with the pharm D on a regular basis for effective management of medications. ;       Review of patient status, including review of consultants reports, relevant laboratory and other  test results, and medications completed;       Advised patient to discuss changes or concerns about DM and DM health and well being with provider;      Screening for signs and symptoms of depression related to chronic disease state;        Assessed social determinant of health barriers;         Symptom Management: Take medications as prescribed   Attend all scheduled provider appointments Call provider office for new concerns or questions  call the Suicide and Crisis Lifeline: 988 call the [place] National Suicide Prevention Lifeline: 579-087-7893 or TTY: 512-332-0341 TTY [phone]) to talk to a trained counselor call 1-800-273-TALK (toll free, 24 hour hotline) if experiencing a Mental Health or Behavioral Health Crisis  schedule appointment with eye doctor check feet daily for cuts, sores or redness trim toenails straight across wash and dry feet carefully every day wear comfortable, cotton socks wear comfortable, well-fitting shoes  Follow Up Plan: Telephone follow up appointment with care management team member scheduled for: 11-18-2022 at 1 pm       CCM Expected Outcome:  Monitor, Self-Manage and Reduce Symptoms of Heart Failure       Current Barriers:  Knowledge Deficits related to changes in fluid balance with CHF  Chronic Disease Management support and education needs related to effective management of CHF EF is 60 to 65% on 11-08-2020, new Echo scheduled for 08-07-2022  Wt Readings from Last 3 Encounters:  07/11/22 240 lb 11.2 oz (109.2 kg)  06/24/22 242 lb 9.6 oz (110 kg)  06/04/22 241 lb (109.3 kg)  Self reported weight of 235 to 240    BP Readings from Last 3 Encounters:  07/11/22 (!) 142/81  06/24/22 131/72  06/04/22 124/62     Planned Interventions: Basic overview and discussion of pathophysiology of Heart Failure reviewed. Review. The patient feels her HF is stable at this time. The patient denies any swelling or edema. She has upcoming appointments with the  cardiologist and had a new Echocardiogram on 08-07-2022 with an EF% of 55-60%. The patient denies any acute findings. Provided education on low sodium diet. Review of Heart healthy/ADA diet. Education to monitor for changes in water weight versus actual weight. The patient denies any acute changes or shifts in her weight. Education provided. States her weight is 235 to 240 consistently. Reviewed Heart Failure Action Plan in depth  Assessed need for readable accurate scales in home. The patient has scales and can safely weigh Provided education about placing scale on hard, flat surface Advised patient to weigh each morning after emptying bladder Discussed importance of daily weight and advised patient to weigh and record daily. The patient feels her swelling and edema has leveled out. She denies any acute water shifts. Is monitoring closely. States she is taking her medications as ordered. Reviewed role of diuretics in prevention of fluid overload and management of heart failure. Is taking Lasix daily currently. Knows when she does not have swelling in her ankles to  take only as needed. States she has been taking her Lasix daily the last few days due to swelling. Education on monitoring for dehydration. Discussed the importance of keeping all appointments with provider. Sees pcp in July of 2024.  Provided patient with education about the role of exercise in the management of heart failure Advised patient to discuss changes in water weight, questions or concerns about HF or heart health with provider Screening for signs and symptoms of depression related to chronic disease state  Assessed social determinant of health barriers   Symptom Management: Take medications as prescribed   Attend all scheduled provider appointments Call pharmacy for medication refills 3-7 days in advance of running out of medications Call provider office for new concerns or questions  call the Suicide and Crisis Lifeline:  988 call the [place] National Suicide Prevention Lifeline: 856-202-6446 or TTY: (406)449-5498 TTY 913-618-3530) to talk to a trained counselor call 1-800-273-TALK (toll free, 24 hour hotline) if experiencing a Mental Health or Behavioral Health Crisis  call office if I gain more than 2 pounds in one day or 5 pounds in one week keep legs up while sitting track weight in diary use salt in moderation watch for swelling in feet, ankles and legs every day weigh myself daily develop a rescue plan follow rescue plan if symptoms flare-up track symptoms and what helps feel better or worse dress right for the weather, hot or cold  Follow Up Plan: Telephone follow up appointment with care management team member scheduled for: 11-18-2022 at 1 pm       CCM Expected Outcome:  Monitor, Self-Manage, and Reduce Symptoms of Hypertension       Current Barriers:  Chronic Disease Management support and education needs related to effective management of HTN  BP Readings from Last 3 Encounters:  07/11/22 (!) 142/81  06/24/22 131/72  06/04/22 124/62    Planned Interventions: Evaluation of current treatment plan related to hypertension self management and patient's adherence to plan as established by provider. The patient states her blood pressures have been stable. States that her readings have been better and some readings with systolic of 106.  Provided education to patient re: stroke prevention, s/s of heart attack and stroke; Reviewed prescribed diet heart healthy/ADA diet. Review and discussed monitoring for hidden sodium and sugars in food and how to effectively balance food intake   Reviewed medications with patient and discussed importance of compliance. Is compliant with medications. Works with the pharm D on a regular basis for effective management of medications;  Discussed plans with patient for ongoing care management follow up and provided patient with direct contact information for care  management team; Advised patient, providing education and rationale, to monitor blood pressure daily and record, calling PCP for findings outside established parameters. The patient states that her blood pressures have been good at home. The patient does have know "white coat syndrome" Reviewed scheduled/upcoming provider appointments including: 11-06-2022. Sees specialist on a regular basis. Advised patient to discuss changes in blood pressures and heart health with provider; Provided education on prescribed diet heart healthy/ADA diet. Education and review. Will send email information for healthy planning for meals ;  Discussed complications of poorly controlled blood pressure such as heart disease, stroke, circulatory complications, vision complications, kidney impairment, sexual dysfunction;  Screening for signs and symptoms of depression related to chronic disease state;  Assessed social determinant of health barriers;  Was approved for Orencia for her RA and is happy about this. Takes an injection  once a week and she can tell a positive difference in her RA pain. She is thankful for this and thankful for the support she gets from pharm D with cost constraints related to medications.   Symptom Management: Take medications as prescribed   Attend all scheduled provider appointments Call provider office for new concerns or questions  call the Suicide and Crisis Lifeline: 988 call the [place] National Suicide Prevention Lifeline: 8105206191 or TTY: 305-636-6412 TTY [phone]) to talk to a trained counselor call 1-800-273-TALK (toll free, 24 hour hotline) if experiencing a Mental Health or Behavioral Health Crisis  check blood pressure 3 times per week choose a place to take my blood pressure (home, clinic or office, retail store) write blood pressure results in a log or diary learn about high blood pressure keep a blood pressure log take blood pressure log to all doctor appointments call  doctor for signs and symptoms of high blood pressure develop an action plan for high blood pressure keep all doctor appointments take medications for blood pressure exactly as prescribed report new symptoms to your doctor  Follow Up Plan: Telephone follow up appointment with care management team member scheduled for: 11-18-2022 at 1 pm          Patient verbalizes understanding of instructions and care plan provided today and agrees to view in MyChart. Active MyChart status and patient understanding of how to access instructions and care plan via MyChart confirmed with patient.  Telephone follow up appointment with care management team member scheduled for: 11-18-2022 at 1 pm

## 2022-10-04 DIAGNOSIS — I5030 Unspecified diastolic (congestive) heart failure: Secondary | ICD-10-CM

## 2022-10-04 DIAGNOSIS — E1159 Type 2 diabetes mellitus with other circulatory complications: Secondary | ICD-10-CM | POA: Diagnosis not present

## 2022-10-04 DIAGNOSIS — I110 Hypertensive heart disease with heart failure: Secondary | ICD-10-CM | POA: Diagnosis not present

## 2022-10-24 DIAGNOSIS — E113393 Type 2 diabetes mellitus with moderate nonproliferative diabetic retinopathy without macular edema, bilateral: Secondary | ICD-10-CM | POA: Diagnosis not present

## 2022-10-31 ENCOUNTER — Other Ambulatory Visit: Payer: Medicare Other

## 2022-10-31 DIAGNOSIS — M3505 Sjogren syndrome with inflammatory arthritis: Secondary | ICD-10-CM | POA: Diagnosis not present

## 2022-10-31 DIAGNOSIS — D649 Anemia, unspecified: Secondary | ICD-10-CM | POA: Diagnosis not present

## 2022-10-31 DIAGNOSIS — E1169 Type 2 diabetes mellitus with other specified complication: Secondary | ICD-10-CM | POA: Diagnosis not present

## 2022-11-01 LAB — COMPLETE METABOLIC PANEL WITH GFR
AG Ratio: 1.2 (calc) (ref 1.0–2.5)
ALT: 23 U/L (ref 6–29)
AST: 32 U/L (ref 10–35)
Albumin: 3.7 g/dL (ref 3.6–5.1)
Alkaline phosphatase (APISO): 59 U/L (ref 37–153)
BUN/Creatinine Ratio: 37 (calc) — ABNORMAL HIGH (ref 6–22)
BUN: 56 mg/dL — ABNORMAL HIGH (ref 7–25)
CO2: 30 mmol/L (ref 20–32)
Calcium: 9.5 mg/dL (ref 8.6–10.4)
Chloride: 103 mmol/L (ref 98–110)
Creat: 1.53 mg/dL — ABNORMAL HIGH (ref 0.60–1.00)
Globulin: 3 g/dL (calc) (ref 1.9–3.7)
Glucose, Bld: 184 mg/dL — ABNORMAL HIGH (ref 65–99)
Potassium: 4.4 mmol/L (ref 3.5–5.3)
Sodium: 141 mmol/L (ref 135–146)
Total Bilirubin: 0.3 mg/dL (ref 0.2–1.2)
Total Protein: 6.7 g/dL (ref 6.1–8.1)
eGFR: 35 mL/min/{1.73_m2} — ABNORMAL LOW (ref >=60)

## 2022-11-01 LAB — CBC WITH DIFFERENTIAL/PLATELET
Absolute Monocytes: 475 cells/uL (ref 200–950)
Basophils Absolute: 30 cells/uL (ref 0–200)
Basophils Relative: 0.8 %
Eosinophils Absolute: 72 cells/uL (ref 15–500)
Eosinophils Relative: 1.9 %
HCT: 30.6 % — ABNORMAL LOW (ref 35.0–45.0)
Hemoglobin: 9.8 g/dL — ABNORMAL LOW (ref 11.7–15.5)
Lymphs Abs: 1543 cells/uL (ref 850–3900)
MCH: 29.6 pg (ref 27.0–33.0)
MCHC: 32 g/dL (ref 32.0–36.0)
MCV: 92.4 fL (ref 80.0–100.0)
MPV: 9.7 fL (ref 7.5–12.5)
Monocytes Relative: 12.5 %
Neutro Abs: 1680 cells/uL (ref 1500–7800)
Neutrophils Relative %: 44.2 %
Platelets: 163 10*3/uL (ref 140–400)
RBC: 3.31 10*6/uL — ABNORMAL LOW (ref 3.80–5.10)
RDW: 13.3 % (ref 11.0–15.0)
Total Lymphocyte: 40.6 %
WBC: 3.8 10*3/uL (ref 3.8–10.8)

## 2022-11-01 LAB — HEMOGLOBIN A1C
Hgb A1c MFr Bld: 7 % of total Hgb — ABNORMAL HIGH (ref <5.7)
Mean Plasma Glucose: 154 mg/dL
eAG (mmol/L): 8.5 mmol/L

## 2022-11-06 ENCOUNTER — Ambulatory Visit (INDEPENDENT_AMBULATORY_CARE_PROVIDER_SITE_OTHER): Payer: Medicare Other | Admitting: Family Medicine

## 2022-11-06 VITALS — BP 138/66 | HR 74 | Temp 98.4°F | Ht 60.0 in | Wt 244.0 lb

## 2022-11-06 DIAGNOSIS — I700 Atherosclerosis of aorta: Secondary | ICD-10-CM

## 2022-11-06 DIAGNOSIS — I10 Essential (primary) hypertension: Secondary | ICD-10-CM

## 2022-11-06 DIAGNOSIS — E1169 Type 2 diabetes mellitus with other specified complication: Secondary | ICD-10-CM

## 2022-11-06 DIAGNOSIS — E785 Hyperlipidemia, unspecified: Secondary | ICD-10-CM | POA: Diagnosis not present

## 2022-11-06 DIAGNOSIS — M3505 Sjogren syndrome with inflammatory arthritis: Secondary | ICD-10-CM | POA: Diagnosis not present

## 2022-11-06 DIAGNOSIS — I5032 Chronic diastolic (congestive) heart failure: Secondary | ICD-10-CM

## 2022-11-06 DIAGNOSIS — M0609 Rheumatoid arthritis without rheumatoid factor, multiple sites: Secondary | ICD-10-CM | POA: Diagnosis not present

## 2022-11-06 MED ORDER — FUROSEMIDE 40 MG PO TABS: 40.0000 mg | ORAL_TABLET | Freq: Every day | ORAL | 3 refills | Status: AC

## 2022-11-06 NOTE — Progress Notes (Signed)
Subjective:    Patient ID: Diana Harmon, female    DOB: 11/21/1945, 77 y.o.   MRN: [id number]  Diana Harmon is a 77 y.o. female presenting on 11/06/2022 for Diabetes and Edema (Pt complaining of intermittent swelling in legs and hands. She requesting that you increase her Lasix to help with the swelling. )   HPI  CHRONIC DM, Type 2 with Hyperlipidemia, Morbid Obesity HTN CKD-III Creatinine mild elevated 1.5 range, prior 1.2 to 1.4 A1c up to 7.0, past range 6-6.7 Side effect on metformin has stopped. CBGs: 90s to 170s - stable overall Meds - Trulicity 0.75mg  weekly inj - on financial assistance Reports good compliance, tolerating med well Currently on ARB, ASA 81, Simvastatin 20mg  Lifestyle: Wt gain 5 lbs in 3-4 months Improving DM diet. Low carb. Improved diet with more fish, BBQ, more greens, kale. - Followed by Dr Hulen Luster optometry - last exam done 10/24/22 - will receive copy Denies hypoglycemia   Anemia, chronic Last lab Hgb 10.9 stable to improved   PMH Sjogren's Syndrome Controlled on Hydroxychloroquine She admits issue with itching of skin. She is using eye drops and compresses on eyes.   CHF No recent flare up or sudden wt gain or fluid build up On Furosemide 20mg  daily but says episodic worse swelling, wants to try double rx    Aortic Atherosclerosis Prior seen on imaging CXR 11/05/19      11/06/2022    8:58 AM 05/24/2022   10:32 AM 05/01/2022   10:43 AM  Depression screen PHQ 2/9  Decreased Interest 0 0 0  Down, Depressed, Hopeless 0 0 0  PHQ - 2 Score 0 0 0  Altered sleeping 0 0 0  Tired, decreased energy 0 0 0  Change in appetite 0 0 0  Feeling bad or failure about yourself  0 0 0  Trouble concentrating 0 0 0  Moving slowly or fidgety/restless 0 0 0  Suicidal thoughts 0 0 0  PHQ-9 Score 0 0 0  Difficult doing work/chores Not difficult at all Not difficult at all Not difficult at all    Social History   Tobacco Use    Smoking status: Former    Years: 20    Types: Cigarettes    Quit date: 09/05/1979    Years since quitting: 43.2   Smokeless tobacco: Former  Building services engineer Use: Never used  Substance Use Topics   Alcohol use: Not Currently   Drug use: No    Review of Systems Per HPI unless specifically indicated above     Objective:    BP 138/66 (BP Location: Left Arm, Patient Position: Sitting, Cuff Size: Large)   Pulse 74   Temp 98.4 F (36.9 C) (Oral)   Ht 5' (1.524 m)   Wt 244 lb (110.7 kg)   SpO2 98%   BMI 47.65 kg/m   Wt Readings from Last 3 Encounters:  11/06/22 244 lb (110.7 kg)  07/11/22 240 lb 11.2 oz (109.2 kg)  06/24/22 242 lb 9.6 oz (110 kg)    Physical Exam Vitals and nursing note reviewed.  Constitutional:      General: She is not in acute distress.    Appearance: She is well-developed. She is not diaphoretic.     Comments: Well-appearing, comfortable, cooperative  HENT:     Head: Normocephalic and atraumatic.  Eyes:     General:        Right eye: No discharge.  Left eye: No discharge.     Conjunctiva/sclera: Conjunctivae normal.     Pupils: Pupils are equal, round, and reactive to light.  Neck:     Thyroid: No thyromegaly.     Vascular: No carotid bruit.  Cardiovascular:     Rate and Rhythm: Normal rate and regular rhythm.     Pulses: Normal pulses.     Heart sounds: Normal heart sounds. No murmur heard. Pulmonary:     Effort: Pulmonary effort is normal. No respiratory distress.     Breath sounds: Normal breath sounds. No wheezing or rales.  Abdominal:     General: Bowel sounds are normal. There is no distension.     Palpations: Abdomen is soft. There is no mass.     Tenderness: There is no abdominal tenderness.  Musculoskeletal:        General: No tenderness. Normal range of motion.     Cervical back: Normal range of motion and neck supple.     Right lower leg: Edema (+1 pitting edema, has compression socks) present.     Left lower leg: Edema  present.     Comments: Upper / Lower Extremities: - Normal muscle tone, strength bilateral upper extremities 5/5, lower extremities 5/5  Lymphadenopathy:     Cervical: No cervical adenopathy.  Skin:    General: Skin is warm and dry.     Findings: No erythema or rash.  Neurological:     Mental Status: She is alert and oriented to person, place, and time.     Comments: Distal sensation intact to light touch all extremities  Psychiatric:        Mood and Affect: Mood normal.        Behavior: Behavior normal.        Thought Content: Thought content normal.     Comments: Well groomed, good eye contact, normal speech and thoughts       Results for orders placed or performed in visit on 10/31/22  Hemoglobin A1c  Result Value Ref Range   Hgb A1c MFr Bld 7.0 (H) <5.7 % of total Hgb   Mean Plasma Glucose 154 mg/dL   eAG (mmol/L) 8.5 mmol/L  CBC with Differential/Platelet  Result Value Ref Range   WBC 3.8 3.8 - 10.8 Thousand/uL   RBC 3.31 (L) 3.80 - 5.10 Million/uL   Hemoglobin 9.8 (L) 11.7 - 15.5 g/dL   HCT 16.1 (L) 09.6 - 04.5 %   MCV 92.4 80.0 - 100.0 fL   MCH 29.6 27.0 - 33.0 pg   MCHC 32.0 32.0 - 36.0 g/dL   RDW 40.9 81.1 - 91.4 %   Platelets 163 140 - 400 Thousand/uL   MPV 9.7 7.5 - 12.5 fL   Neutro Abs 1,680 1,500 - 7,800 cells/uL   Lymphs Abs 1,543 850 - 3,900 cells/uL   Absolute Monocytes 475 200 - 950 cells/uL   Eosinophils Absolute 72 15 - 500 cells/uL   Basophils Absolute 30 0 - 200 cells/uL   Neutrophils Relative % 44.2 %   Total Lymphocyte 40.6 %   Monocytes Relative 12.5 %   Eosinophils Relative 1.9 %   Basophils Relative 0.8 %  COMPLETE METABOLIC PANEL WITH GFR  Result Value Ref Range   Glucose, Bld 184 (H) 65 - 99 mg/dL   BUN 56 (H) 7 - 25 mg/dL   Creat 7.82 (H) 9.56 - 1.00 mg/dL   eGFR 35 (L) > OR = 60 mL/min/1.55m2   BUN/Creatinine Ratio 37 (H) 6 - 22 (calc)  Sodium 141 135 - 146 mmol/L   Potassium 4.4 3.5 - 5.3 mmol/L   Chloride 103 98 - 110 mmol/L    CO2 30 20 - 32 mmol/L   Calcium 9.5 8.6 - 10.4 mg/dL   Total Protein 6.7 6.1 - 8.1 g/dL   Albumin 3.7 3.6 - 5.1 g/dL   Globulin 3.0 1.9 - 3.7 g/dL (calc)   AG Ratio 1.2 1.0 - 2.5 (calc)   Total Bilirubin 0.3 0.2 - 1.2 mg/dL   Alkaline phosphatase (APISO) 59 37 - 153 U/L   AST 32 10 - 35 U/L   ALT 23 6 - 29 U/L      Assessment & Plan:   Problem List Items Addressed This Visit     Chronic heart failure with preserved ejection fraction (HFpEF) (HCC)    Stable without flare Euvolemic On med management  We will change Furosemide from 20 to 40mg  daily today Discussion that I recommend SGLT2 Farxiga or Jardiance going forward for fluid balance control CHF and also improve blood sugar.  She will consider this check cost and f/u with Cardiology      Relevant Medications   furosemide (LASIX) 40 MG tablet   Essential hypertension    Well-controlled HTN - Home BP readings reviewed  Complication meniere's disease, mild AS, CKD II   Plan:  1.  Continue current BP regimen HCTZ 25mg  daily, Losartan 50mg  daily 2. Encourage improved lifestyle - low sodium diet, regular exercise 3. Continue monitor BP outside office, bring readings to next visit, if persistently >140/90 or new symptoms notify office sooner      Relevant Medications   furosemide (LASIX) 40 MG tablet   Hyperlipidemia associated with type 2 diabetes mellitus (HCC)   Relevant Medications   furosemide (LASIX) 40 MG tablet   Morbid obesity (HCC)   Rheumatoid arthritis of multiple sites with negative rheumatoid factor (HCC)   Relevant Medications   ORENCIA CLICKJECT 125 MG/ML SOAJ   Sjogren's syndrome with inflammatory arthritis (HCC)   Relevant Medications   ORENCIA CLICKJECT 125 MG/ML SOAJ   Type 2 diabetes mellitus with other specified complication (HCC) - Primary    A1c 7.0 Controlled No hyperglycemia or hypoglycemia Complications - other including hyperlipidemia, GERD, obesity - increases risk of future  cardiovascular complications   Plan:  Continue Trulicity 0.75mg  weekly inj doing well on GLP1 - Lilly Cares PAP Consider SGLT2  2. Encourage improved lifestyle - low carb, low sugar diet, reduce portion size, continue improving regular exercise 3. Check CBG, bring log to next visit for review 4. Continue ASA, ARB, Statin       Other Visit Diagnoses     Aortic atherosclerosis (HCC)       Relevant Medications   furosemide (LASIX) 40 MG tablet       Mild elevated A1c to 7.0, this is fine overall.  Keep on Trulicity injection  Dose increase Furosemide from 20mg  daily up to 40mg  daily for fluid balance and swelling.   Meds ordered this encounter  Medications   furosemide (LASIX) 40 MG tablet    Sig: Take 1 tablet (40 mg total) by mouth daily.    Dispense:  90 tablet    Refill:  3    Follow up plan: Return in about 5 months (around 04/08/2023) for 5-6 month range follow-up DM A1c, SGLT med, update Cards.   Saralyn Pilar, DO [facility] 11/06/2022, 9:04 AM

## 2022-11-06 NOTE — Assessment & Plan Note (Signed)
A1c 7.0 Controlled No hyperglycemia or hypoglycemia Complications - other including hyperlipidemia, GERD, obesity - increases risk of future cardiovascular complications   Plan:  Continue Trulicity 0.75mg  weekly inj doing well on GLP1 - Lilly Cares PAP Consider SGLT2  2. Encourage improved lifestyle - low carb, low sugar diet, reduce portion size, continue improving regular exercise 3. Check CBG, bring log to next visit for review 4. Continue ASA, ARB, Statin

## 2022-11-06 NOTE — Assessment & Plan Note (Signed)
Stable without flare Euvolemic On med management  We will change Furosemide from 20 to 40mg  daily today Discussion that I recommend SGLT2 Farxiga or Jardiance going forward for fluid balance control CHF and also improve blood sugar.  She will consider this check cost and f/u with Cardiology

## 2022-11-06 NOTE — Assessment & Plan Note (Signed)
Well-controlled HTN - Home BP readings reviewed  Complication meniere's disease, mild AS, CKD II   Plan:  1.  Continue current BP regimen HCTZ 25mg daily, Losartan 50mg daily 2. Encourage improved lifestyle - low sodium diet, regular exercise 3. Continue monitor BP outside office, bring readings to next visit, if persistently >140/90 or new symptoms notify office sooner 

## 2022-11-06 NOTE — Patient Instructions (Addendum)
Thank you for coming to the office today.  Recent Labs    04/24/22 0812 10/31/22 0804  HGBA1C 6.7* 7.0*   Mild elevated A1c to 7.0, this is fine overall.  Keep on Trulicity injection  Dose increase Furosemide from 20mg  daily up to 40mg  daily for fluid balance and swelling.  Consider newer medication option  Marcelline Deist or London Pepper  These are once daily fluid pills that work to lower blood sugar, treat diabetes and heart failure conditions.  Please let me know if interested and we can order, sooner or wait until December and you can review with Cardiologist.   Please schedule a Follow-up Appointment to: Return in about 5 months (around 04/08/2023) for 5-6 month range follow-up DM A1c, SGLT med, update Cards.  If you have any other questions or concerns, please feel free to call the office or send a message through MyChart. You may also schedule an earlier appointment if necessary.  Additionally, you may be receiving a survey about your experience at our office within a few days to 1 week by e-mail or mail. We value your feedback.  Saralyn Pilar, DO [facility], [place]

## 2022-11-18 ENCOUNTER — Telehealth: Payer: Medicare Other

## 2022-11-18 ENCOUNTER — Ambulatory Visit (INDEPENDENT_AMBULATORY_CARE_PROVIDER_SITE_OTHER): Payer: Medicare Other

## 2022-11-18 DIAGNOSIS — I5032 Chronic diastolic (congestive) heart failure: Secondary | ICD-10-CM

## 2022-11-18 DIAGNOSIS — I10 Essential (primary) hypertension: Secondary | ICD-10-CM

## 2022-11-18 DIAGNOSIS — E1169 Type 2 diabetes mellitus with other specified complication: Secondary | ICD-10-CM

## 2022-11-18 NOTE — Patient Instructions (Signed)
Please call the care guide team at [phone] if you need to cancel or reschedule your appointment.   If you are experiencing a Mental Health or Behavioral Health Crisis or need someone to talk to, please call the Suicide and Crisis Lifeline: 988 call the [place] National Suicide Prevention Lifeline: 647 464 5080 or TTY: 405-673-5456 TTY 513-411-4788) to talk to a trained counselor call 1-800-273-TALK (toll free, 24 hour hotline)   Following is a copy of the CCM Program Consent:  CCM service includes personalized support from designated clinical staff supervised by the physician, including individualized plan of care and coordination with other care providers 24/7 contact phone numbers for assistance for urgent and routine care needs. Service will only be billed when office clinical staff spend 20 minutes or more in a month to coordinate care. Only one practitioner may furnish and bill the service in a calendar month. The patient may stop CCM services at amy time (effective at the end of the month) by phone call to the office staff. The patient will be responsible for cost sharing (co-pay) or up to 20% of the service fee (after annual deductible is met)  Following is a copy of your full provider care plan:   Goals Addressed             This Visit's Progress    CCM Expected Outcome:  Monitor, Self-Manage and Reduce Symptoms of Diabetes       Current Barriers:  Chronic Disease Management support and education needs related to effective management of DM Lab Results  Component Value Date   HGBA1C 7.0 (H) 10/31/2022     Planned Interventions: Provided education to patient about basic DM disease process. Review of goals of A1C. Patient with elevation from last A1C. The patient still with elevated A1C. The patient saw the pcp on 11-06-2022 and recommendations given. Will talk with the pharm D coming up and on a regular basis for effective management of DM medications and disease processes.  Education and support given.; Reviewed medications with patient and discussed importance of medication adherence. The patient is compliant with medications. Also works with pharm D for medications management. States that Trulicity is working well for her. She is happy that she does not have any side effects. Has been approved through 2024 to get Trulicity. Will consider Marcelline Deist or London Pepper but wants to talk to her cardiologist before agreeing to use either of these medications;        Reviewed prescribed diet with patient heart healthy/ADA diet. Review of dietary constraints and the patient states it does get frustrating as she cannot eat the things she wants to eat but she is doing better than what she was with her eating.   The patient states that she is eating more salads now and drinking more water. Has seen big improvements in her dietary habits. Overall feels like she is well balanced with her dietary restrictions; Counseled on importance of regular laboratory monitoring as prescribed. Has regular lab work. ;        Discussed plans with patient for ongoing care management follow up and provided patient with direct contact information for care management team;      Provided patient with written educational materials related to hypo and hyperglycemia and importance of correct treatment. Denies any extreme highs or lows;       Reviewed scheduled/upcoming provider appointments including:  Saw the pcp on 11-06-2022 Advised patient, providing education and rationale, to check cbg when you have symptoms of low or  high blood sugar and as directed   and record. The patient keeps a record of her blood sugars. The patient states her blood sugar this am was 126. Review of goal of fasting blood sugars <130 and post prandial of <180. Education provided.      11-12-2022 152- am   11-13-2022 141-am 108-pm  11-14-2022 129-am   11-15-2022 127-am   11-17-2022  133-pm  11-18-2022 143-am       call provider for findings  outside established parameters;       Referral made to pharmacy team for assistance with ongoing support and education for effective management of medications. The patient works with the pharm D on a regular basis for effective management of medications. ;       Review of patient status, including review of consultants reports, relevant laboratory and other test results, and medications completed;       Advised patient to discuss changes or concerns about DM and DM health and well being with provider;      Screening for signs and symptoms of depression related to chronic disease state;        Assessed social determinant of health barriers;         Symptom Management: Take medications as prescribed   Attend all scheduled provider appointments Call provider office for new concerns or questions  call the Suicide and Crisis Lifeline: 988 call the [place] National Suicide Prevention Lifeline: (409)813-7955 or TTY: 610-064-0890 TTY (843)368-2438) to talk to a trained counselor call 1-800-273-TALK (toll free, 24 hour hotline) if experiencing a Mental Health or Behavioral Health Crisis  schedule appointment with eye doctor check feet daily for cuts, sores or redness trim toenails straight across wash and dry feet carefully every day wear comfortable, cotton socks wear comfortable, well-fitting shoes  Follow Up Plan: Telephone follow up appointment with care management team member scheduled for: 01-02-2023 at 1 pm       CCM Expected Outcome:  Monitor, Self-Manage and Reduce Symptoms of Heart Failure       Current Barriers:  Knowledge Deficits related to changes in fluid balance with CHF  Chronic Disease Management support and education needs related to effective management of CHF EF is 60 to 65% on 11-08-2020, new Echo scheduled for 08-07-2022  Wt Readings from Last 3 Encounters:  11/06/22 244 lb (110.7 kg)  07/11/22 240 lb 11.2 oz (109.2 kg)  06/24/22 242 lb 9.6 oz (110 kg)  Self reported weight  today 11-18-2022 is 240    BP Readings from Last 3 Encounters:  11/06/22 138/66  07/11/22 (!) 142/81  06/24/22 131/72  Self reported blood pressure today is 135/57 with pulse of 76   Planned Interventions: Basic overview and discussion of pathophysiology of Heart Failure reviewed. Review. The patient feels her HF is stable at this time. The patient denies any swelling or edema today. She did have some swelling a couple of weeks ago and feels this is due to the heat. Did have changes in her Lasix dose.  Provided education on low sodium diet. Review of Heart healthy/ADA diet. Education to monitor for changes in water weight versus actual weight. The patient denies any acute changes or shifts in her weight. Education provided. States her weight is 235 to 240 consistently. She said today it was 240 and she has lost 4 pounds and wants to keep it off. Reviewed Heart Failure Action Plan in depth  Assessed need for readable accurate scales in home. The patient has scales and  can safely weigh Provided education about placing scale on hard, flat surface Advised patient to weigh each morning after emptying bladder Discussed importance of daily weight and advised patient to weigh and record daily. The patient feels her swelling and edema has leveled out. She denies any acute water shifts. Is monitoring closely. States she is taking her medications as ordered. Reviewed role of diuretics in prevention of fluid overload and management of heart failure. Is taking Lasix daily currently. Knows when she does not have swelling in her ankles to take only as needed. States she has been taking her Lasix daily the last few days due to swelling. Has had changes in the dosage recently and this is working well for her. Education on monitoring for dehydration. Discussed the importance of keeping all appointments with provider. Saw the pcp on 11-06-2022 and will see the cardiologist in August Provided patient with education about  the role of exercise in the management of heart failure Advised patient to discuss changes in water weight, questions or concerns about HF or heart health with provider Screening for signs and symptoms of depression related to chronic disease state  Assessed social determinant of health barriers   Symptom Management: Take medications as prescribed   Attend all scheduled provider appointments Call pharmacy for medication refills 3-7 days in advance of running out of medications Call provider office for new concerns or questions  call the Suicide and Crisis Lifeline: 988 call the [place] National Suicide Prevention Lifeline: (980)568-9915 or TTY: 214 877 6280 TTY (954) 454-6068) to talk to a trained counselor call 1-800-273-TALK (toll free, 24 hour hotline) if experiencing a Mental Health or Behavioral Health Crisis  call office if I gain more than 2 pounds in one day or 5 pounds in one week keep legs up while sitting track weight in diary use salt in moderation watch for swelling in feet, ankles and legs every day weigh myself daily develop a rescue plan follow rescue plan if symptoms flare-up track symptoms and what helps feel better or worse dress right for the weather, hot or cold  Follow Up Plan: Telephone follow up appointment with care management team member scheduled for: 01-02-2023 at 1 pm       CCM Expected Outcome:  Monitor, Self-Manage, and Reduce Symptoms of Hypertension       Current Barriers:  Chronic Disease Management support and education needs related to effective management of HTN  BP Readings from Last 3 Encounters:  11/06/22 138/66  07/11/22 (!) 142/81  06/24/22 131/72    Planned Interventions: Evaluation of current treatment plan related to hypertension self management and patient's adherence to plan as established by provider. The patient states her blood pressures have been stable. Self reported blood pressure today of 135/57 with a pulse of 76 Provided  education to patient re: stroke prevention, s/s of heart attack and stroke; Reviewed prescribed diet heart healthy/ADA diet. Review and discussed monitoring for hidden sodium and sugars in food and how to effectively balance food intake   Reviewed medications with patient and discussed importance of compliance. Is compliant with medications. Works with the pharm D on a regular basis for effective management of medications;  Discussed plans with patient for ongoing care management follow up and provided patient with direct contact information for care management team; Advised patient, providing education and rationale, to monitor blood pressure daily and record, calling PCP for findings outside established parameters. The patient states that her blood pressures have been good at home. The patient does have  know "white coat syndrome" Reviewed scheduled/upcoming provider appointments including: saw pcp on 11-06-2022. Sees specialist on a regular basis. Advised patient to discuss changes in blood pressures and heart health with provider; Provided education on prescribed diet heart healthy/ADA diet. Education and review. Will send email information for healthy planning for meals ;  Discussed complications of poorly controlled blood pressure such as heart disease, stroke, circulatory complications, vision complications, kidney impairment, sexual dysfunction;  Screening for signs and symptoms of depression related to chronic disease state;  Assessed social determinant of health barriers;  Was approved for Orencia for her RA and is happy about this. Takes an injection once a week and she can tell a positive difference in her RA pain. She is thankful for this and thankful for the support she gets from pharm D with cost constraints related to medications.   Symptom Management: Take medications as prescribed   Attend all scheduled provider appointments Call provider office for new concerns or questions  call the  Suicide and Crisis Lifeline: 988 call the [place] National Suicide Prevention Lifeline: 302-377-7445 or TTY: (236)560-5876 TTY 7863401207) to talk to a trained counselor call 1-800-273-TALK (toll free, 24 hour hotline) if experiencing a Mental Health or Behavioral Health Crisis  check blood pressure 3 times per week choose a place to take my blood pressure (home, clinic or office, retail store) write blood pressure results in a log or diary learn about high blood pressure keep a blood pressure log take blood pressure log to all doctor appointments call doctor for signs and symptoms of high blood pressure develop an action plan for high blood pressure keep all doctor appointments take medications for blood pressure exactly as prescribed report new symptoms to your doctor  Follow Up Plan: Telephone follow up appointment with care management team member scheduled for: 01-02-2023 at 1 pm          Patient verbalizes understanding of instructions and care plan provided today and agrees to view in MyChart. Active MyChart status and patient understanding of how to access instructions and care plan via MyChart confirmed with patient.  Telephone follow up appointment with care management team member scheduled for: 01-02-2023 at 1 pm

## 2022-11-18 NOTE — Chronic Care Management (AMB) (Signed)
Chronic Care Management   CCM RN Visit Note  11/18/2022 Name: Diana Harmon MRN: [id number] DOB: August 04, 1945  Subjective: Diana Harmon is a 77 y.o. year old female who is a primary care patient of Diana Cords, DO. The patient was referred to the Chronic Care Management team for assistance with care management needs subsequent to provider initiation of CCM services and plan of care.    Today's Visit:  Engaged with patient by telephone for follow up visit.     SDOH Interventions Today    Flowsheet Row Most Recent Value  SDOH Interventions   Health Literacy Interventions Intervention Not Indicated         Goals Addressed             This Visit's Progress    CCM Expected Outcome:  Monitor, Self-Manage and Reduce Symptoms of Diabetes       Current Barriers:  Chronic Disease Management support and education needs related to effective management of DM Lab Results  Component Value Date   HGBA1C 7.0 (H) 10/31/2022     Planned Interventions: Provided education to patient about basic DM disease process. Review of goals of A1C. Patient with elevation from last A1C. The patient still with elevated A1C. The patient saw the pcp on 11-06-2022 and recommendations given. Will talk with the pharm D coming up and on a regular basis for effective management of DM medications and disease processes. Education and support given.; Reviewed medications with patient and discussed importance of medication adherence. The patient is compliant with medications. Also works with pharm D for medications management. States that Trulicity is working well for her. She is happy that she does not have any side effects. Has been approved through 2024 to get Trulicity. Will consider Marcelline Deist or London Pepper but wants to talk to her cardiologist before agreeing to use either of these medications;        Reviewed prescribed diet with patient heart healthy/ADA diet. Review of dietary  constraints and the patient states it does get frustrating as she cannot eat the things she wants to eat but she is doing better than what she was with her eating.   The patient states that she is eating more salads now and drinking more water. Has seen big improvements in her dietary habits. Overall feels like she is well balanced with her dietary restrictions; Counseled on importance of regular laboratory monitoring as prescribed. Has regular lab work. ;        Discussed plans with patient for ongoing care management follow up and provided patient with direct contact information for care management team;      Provided patient with written educational materials related to hypo and hyperglycemia and importance of correct treatment. Denies any extreme highs or lows;       Reviewed scheduled/upcoming provider appointments including:  Saw the pcp on 11-06-2022 Advised patient, providing education and rationale, to check cbg when you have symptoms of low or high blood sugar and as directed   and record. The patient keeps a record of her blood sugars. The patient states her blood sugar this am was 126. Review of goal of fasting blood sugars <130 and post prandial of <180. Education provided.      11-12-2022 152- am   11-13-2022 141-am 108-pm  11-14-2022 129-am   11-15-2022 127-am   11-17-2022  133-pm  11-18-2022 143-am       call provider for findings outside established parameters;  Referral made to pharmacy team for assistance with ongoing support and education for effective management of medications. The patient works with the pharm D on a regular basis for effective management of medications. ;       Review of patient status, including review of consultants reports, relevant laboratory and other test results, and medications completed;       Advised patient to discuss changes or concerns about DM and DM health and well being with provider;      Screening for signs and symptoms of depression related to  chronic disease state;        Assessed social determinant of health barriers;         Symptom Management: Take medications as prescribed   Attend all scheduled provider appointments Call provider office for new concerns or questions  call the Suicide and Crisis Lifeline: 988 call the [place] National Suicide Prevention Lifeline: 364-503-2370 or TTY: (206)100-6751 TTY 4241879307) to talk to a trained counselor call 1-800-273-TALK (toll free, 24 hour hotline) if experiencing a Mental Health or Behavioral Health Crisis  schedule appointment with eye doctor check feet daily for cuts, sores or redness trim toenails straight across wash and dry feet carefully every day wear comfortable, cotton socks wear comfortable, well-fitting shoes  Follow Up Plan: Telephone follow up appointment with care management team member scheduled for: 01-02-2023 at 1 pm       CCM Expected Outcome:  Monitor, Self-Manage and Reduce Symptoms of Heart Failure       Current Barriers:  Knowledge Deficits related to changes in fluid balance with CHF  Chronic Disease Management support and education needs related to effective management of CHF EF is 60 to 65% on 11-08-2020, new Echo scheduled for 08-07-2022  Wt Readings from Last 3 Encounters:  11/06/22 244 lb (110.7 kg)  07/11/22 240 lb 11.2 oz (109.2 kg)  06/24/22 242 lb 9.6 oz (110 kg)  Self reported weight today 11-18-2022 is 240    BP Readings from Last 3 Encounters:  11/06/22 138/66  07/11/22 (!) 142/81  06/24/22 131/72  Self reported blood pressure today is 135/57 with pulse of 76   Planned Interventions: Basic overview and discussion of pathophysiology of Heart Failure reviewed. Review. The patient feels her HF is stable at this time. The patient denies any swelling or edema today. She did have some swelling a couple of weeks ago and feels this is due to the heat. Did have changes in her Lasix dose.  Provided education on low sodium diet. Review of Heart  healthy/ADA diet. Education to monitor for changes in water weight versus actual weight. The patient denies any acute changes or shifts in her weight. Education provided. States her weight is 235 to 240 consistently. She said today it was 240 and she has lost 4 pounds and wants to keep it off. Reviewed Heart Failure Action Plan in depth  Assessed need for readable accurate scales in home. The patient has scales and can safely weigh Provided education about placing scale on hard, flat surface Advised patient to weigh each morning after emptying bladder Discussed importance of daily weight and advised patient to weigh and record daily. The patient feels her swelling and edema has leveled out. She denies any acute water shifts. Is monitoring closely. States she is taking her medications as ordered. Reviewed role of diuretics in prevention of fluid overload and management of heart failure. Is taking Lasix daily currently. Knows when she does not have swelling in her ankles to  take only as needed. States she has been taking her Lasix daily the last few days due to swelling. Has had changes in the dosage recently and this is working well for her. Education on monitoring for dehydration. Discussed the importance of keeping all appointments with provider. Saw the pcp on 11-06-2022 and will see the cardiologist in August Provided patient with education about the role of exercise in the management of heart failure Advised patient to discuss changes in water weight, questions or concerns about HF or heart health with provider Screening for signs and symptoms of depression related to chronic disease state  Assessed social determinant of health barriers   Symptom Management: Take medications as prescribed   Attend all scheduled provider appointments Call pharmacy for medication refills 3-7 days in advance of running out of medications Call provider office for new concerns or questions  call the Suicide and  Crisis Lifeline: 988 call the [place] National Suicide Prevention Lifeline: 3235583038 or TTY: 763-147-7476 TTY (317)610-3136) to talk to a trained counselor call 1-800-273-TALK (toll free, 24 hour hotline) if experiencing a Mental Health or Behavioral Health Crisis  call office if I gain more than 2 pounds in one day or 5 pounds in one week keep legs up while sitting track weight in diary use salt in moderation watch for swelling in feet, ankles and legs every day weigh myself daily develop a rescue plan follow rescue plan if symptoms flare-up track symptoms and what helps feel better or worse dress right for the weather, hot or cold  Follow Up Plan: Telephone follow up appointment with care management team member scheduled for: 01-02-2023 at 1 pm       CCM Expected Outcome:  Monitor, Self-Manage, and Reduce Symptoms of Hypertension       Current Barriers:  Chronic Disease Management support and education needs related to effective management of HTN  BP Readings from Last 3 Encounters:  11/06/22 138/66  07/11/22 (!) 142/81  06/24/22 131/72    Planned Interventions: Evaluation of current treatment plan related to hypertension self management and patient's adherence to plan as established by provider. The patient states her blood pressures have been stable. Self reported blood pressure today of 135/57 with a pulse of 76 Provided education to patient re: stroke prevention, s/s of heart attack and stroke; Reviewed prescribed diet heart healthy/ADA diet. Review and discussed monitoring for hidden sodium and sugars in food and how to effectively balance food intake   Reviewed medications with patient and discussed importance of compliance. Is compliant with medications. Works with the pharm D on a regular basis for effective management of medications;  Discussed plans with patient for ongoing care management follow up and provided patient with direct contact information for care management  team; Advised patient, providing education and rationale, to monitor blood pressure daily and record, calling PCP for findings outside established parameters. The patient states that her blood pressures have been good at home. The patient does have know "white coat syndrome" Reviewed scheduled/upcoming provider appointments including: saw pcp on 11-06-2022. Sees specialist on a regular basis. Advised patient to discuss changes in blood pressures and heart health with provider; Provided education on prescribed diet heart healthy/ADA diet. Education and review. Will send email information for healthy planning for meals ;  Discussed complications of poorly controlled blood pressure such as heart disease, stroke, circulatory complications, vision complications, kidney impairment, sexual dysfunction;  Screening for signs and symptoms of depression related to chronic disease state;  Assessed social determinant  of health barriers;  Was approved for Orencia for her RA and is happy about this. Takes an injection once a week and she can tell a positive difference in her RA pain. She is thankful for this and thankful for the support she gets from pharm D with cost constraints related to medications.   Symptom Management: Take medications as prescribed   Attend all scheduled provider appointments Call provider office for new concerns or questions  call the Suicide and Crisis Lifeline: 988 call the [place] National Suicide Prevention Lifeline: 9855271082 or TTY: (585)734-3213 TTY 918-743-4077) to talk to a trained counselor call 1-800-273-TALK (toll free, 24 hour hotline) if experiencing a Mental Health or Behavioral Health Crisis  check blood pressure 3 times per week choose a place to take my blood pressure (home, clinic or office, retail store) write blood pressure results in a log or diary learn about high blood pressure keep a blood pressure log take blood pressure log to all doctor appointments call  doctor for signs and symptoms of high blood pressure develop an action plan for high blood pressure keep all doctor appointments take medications for blood pressure exactly as prescribed report new symptoms to your doctor  Follow Up Plan: Telephone follow up appointment with care management team member scheduled for: 01-02-2023 at 1 pm          Plan:Telephone follow up appointment with care management team member scheduled for:  01-02-2023 at 1 pm  Alto Denver RN, MSN, CCM RN Care Manager  Chronic Care Management Direct Number: [phone]

## 2022-12-04 DIAGNOSIS — Z7984 Long term (current) use of oral hypoglycemic drugs: Secondary | ICD-10-CM | POA: Diagnosis not present

## 2022-12-04 DIAGNOSIS — I5030 Unspecified diastolic (congestive) heart failure: Secondary | ICD-10-CM | POA: Diagnosis not present

## 2022-12-04 DIAGNOSIS — E1159 Type 2 diabetes mellitus with other circulatory complications: Secondary | ICD-10-CM | POA: Diagnosis not present

## 2022-12-04 DIAGNOSIS — I110 Hypertensive heart disease with heart failure: Secondary | ICD-10-CM

## 2022-12-08 ENCOUNTER — Other Ambulatory Visit: Payer: Self-pay | Admitting: Family Medicine

## 2022-12-08 DIAGNOSIS — E7800 Pure hypercholesterolemia, unspecified: Secondary | ICD-10-CM

## 2022-12-13 ENCOUNTER — Encounter: Payer: Self-pay | Admitting: Internal Medicine

## 2022-12-16 DIAGNOSIS — Z7960 Long term (current) use of unspecified immunomodulators and immunosuppressants: Secondary | ICD-10-CM | POA: Diagnosis not present

## 2022-12-16 DIAGNOSIS — M3505 Sjogren syndrome with inflammatory arthritis: Secondary | ICD-10-CM | POA: Diagnosis not present

## 2022-12-16 DIAGNOSIS — M0609 Rheumatoid arthritis without rheumatoid factor, multiple sites: Secondary | ICD-10-CM | POA: Diagnosis not present

## 2022-12-16 DIAGNOSIS — M159 Polyosteoarthritis, unspecified: Secondary | ICD-10-CM | POA: Diagnosis not present

## 2022-12-25 ENCOUNTER — Encounter: Payer: Self-pay | Admitting: Internal Medicine

## 2022-12-27 ENCOUNTER — Other Ambulatory Visit: Payer: Self-pay | Admitting: Family Medicine

## 2022-12-27 DIAGNOSIS — E1169 Type 2 diabetes mellitus with other specified complication: Secondary | ICD-10-CM

## 2022-12-27 NOTE — Telephone Encounter (Signed)
Requested medication (s) are due for refill today: routing for review  Requested medication (s) are on the active medication list: yes  Last refill:  11/09/20  Future visit scheduled: no  Notes to clinic:  Last refill 11/09/20, should patient continue to take? Routing for review.     Requested Prescriptions  Pending Prescriptions Disp Refills   TRULICITY 0.75 MG/0.5ML SOPN [Pharmacy Med Name: Trulicity Subcutaneous Solution Pen-injector 0.75 MG/0.5ML] 8 mL 0    Sig: INJECT 0.75MG  (0.5ML) UNDER THE SKIN ONCE A WEEK.     Endocrinology:  Diabetes - GLP-1 Receptor Agonists Passed - 12/27/2022  8:01 AM      Passed - HBA1C is between 0 and 7.9 and within 180 days    Hemoglobin A1C  Date Value Ref Range Status  10/04/2020 7.4  Final    Comment:    [facility]   Hgb A1c MFr Bld  Date Value Ref Range Status  10/31/2022 7.0 (H) <5.7 % of total Hgb Final    Comment:    For someone without known diabetes, a hemoglobin A1c value of 6.5% or greater indicates that they may have  diabetes and this should be confirmed with a follow-up  test. . For someone with known diabetes, a value <7% indicates  that their diabetes is well controlled and a value  greater than or equal to 7% indicates suboptimal  control. A1c targets should be individualized based on  duration of diabetes, age, comorbid conditions, and  other considerations. . Currently, no consensus exists regarding use of hemoglobin A1c for diagnosis of diabetes for children. Verna Czech - Valid encounter within last 6 months    Recent Outpatient Visits           1 month ago Type 2 diabetes mellitus with other specified complication, without long-term current use of insulin [facility])   [place] [facility] [place], Netta Neat, DO   6 months ago Dysuria   [place] [facility] [place] [facility] [place], Salvadore Oxford, NP   8 months ago Annual physical exam   [place] [facility] Smitty Cords, DO   1 year ago Type 2 diabetes mellitus with other specified complication, without long-term current use of insulin [facility])   [place] [facility] Smitty Cords, DO   1 year ago Annual physical exam   [place] [facility] Smitty Cords, [place]

## 2023-01-02 ENCOUNTER — Ambulatory Visit
Admission: RE | Admit: 2023-01-02 | Discharge: 2023-01-02 | Disposition: A | Discharge status: Home / Self Care | Payer: Medicare Other | Source: Ambulatory Visit | Attending: Family Medicine | Admitting: Family Medicine

## 2023-01-02 ENCOUNTER — Other Ambulatory Visit: Payer: Medicare Other

## 2023-01-02 ENCOUNTER — Other Ambulatory Visit: Payer: Self-pay

## 2023-01-02 VITALS — BP 156/80 | HR 82 | Temp 99.7°F | Ht 60.0 in | Wt 240.0 lb

## 2023-01-02 DIAGNOSIS — U071 COVID-19: Secondary | ICD-10-CM | POA: Diagnosis not present

## 2023-01-02 LAB — SARS CORONAVIRUS 2 BY RT PCR: SARS Coronavirus 2 by RT PCR: POSITIVE — AB

## 2023-01-02 MED ORDER — PROMETHAZINE-DM 6.25-15 MG/5ML PO SYRP: 2.5000 mL | ORAL_SOLUTION | Freq: Four times a day (QID) | ORAL | 0 refills | Status: DC | PRN

## 2023-01-02 MED ORDER — BENZONATATE 100 MG PO CAPS: 100.0000 mg | ORAL_CAPSULE | Freq: Three times a day (TID) | ORAL | 0 refills | Status: DC

## 2023-01-02 NOTE — Discharge Instructions (Signed)
Your  test for COVID-19 was positive, meaning that you were infected with the novel coronavirus and could give the germ to others.  The recommendations suggest returning to normal activities when, for at least 24 hours, symptoms are improving overall, and if a fever was present, it has been gone without use of a fever-reducing medication.  You should wear a mask for the next 5 days to prevent the spread of disease. Please continue good preventive care measures, including:  frequent hand-washing, avoid touching your face, cover coughs/sneezes, stay out of crowds and keep a 6 foot distance from others.  Go to the nearest hospital emergency room if fever/cough/breathlessness are severe or illness seems like a threat to life.  If your were prescribed medication. Stop by the pharmacy to pick it up. You can take Tylenol and/or Ibuprofen as needed for fever reduction and pain relief.    For cough: honey 1/2 to 1 teaspoon (you can dilute the honey in water or another fluid).  You can also use guaifenesin and dextromethorphan for cough. You can use a humidifier for chest congestion and cough.  If you don't have a humidifier, you can sit in the bathroom with the hot shower running.      For sore throat: try warm salt water gargles, Mucinex sore throat cough drops or cepacol lozenges, throat spray, warm tea or water with lemon/honey, popsicles or ice, or OTC cold relief medicine for throat discomfort. You can also purchase chloraseptic spray at the pharmacy or dollar store.   For congestion: take a daily anti-histamine like Zyrtec, Claritin, and a oral decongestant, such as pseudoephedrine.  You can also use Flonase 1-2 sprays in each nostril daily. Afrin is also a good option, if you do not have high blood pressure.    It is important to stay hydrated: drink plenty of fluids (water, gatorade/powerade/pedialyte, juices, or teas) to keep your throat moisturized and help further relieve irritation/discomfort.     Return or go to the Emergency Department if symptoms worsen or do not improve in the next few days   

## 2023-01-02 NOTE — ED Triage Notes (Signed)
Pt is with her daughter  Pt c/o headache, sore throat, temperature of 101.6, body chills, cough, ear pain x2-3days

## 2023-01-02 NOTE — Patient Instructions (Signed)
Visit Information  Thank you for taking time to visit with me today. Please don't hesitate to contact me if I can be of assistance to you before our next scheduled telephone appointment.  Following are the goals we discussed today:   Goals Addressed             This Visit's Progress    RNCM Care Management  Expected Outcome:  Monitor, Self-Manage and Reduce Symptoms of Diabetes       Current Barriers:  Chronic Disease Management support and education needs related to effective management of DM Lab Results  Component Value Date   HGBA1C 7.0 (H) 10/31/2022     Planned Interventions: Provided education to patient about basic DM disease process. Review of goals of A1C. Patient with elevation from last A1C. The patient still with elevated A1C.The patient was not feeling the best today and did not give readings. She is going to the urgent care today at 530 due to congestion and throat being sore. Reflective listening and support given.  Reviewed medications with patient and discussed importance of medication adherence. The patient is compliant with medications. Also works with pharm D for medications management. States that Trulicity is working well for her. She is happy that she does not have any side effects. Has been approved through 2024 to get Trulicity. Will consider Marcelline Deist or London Pepper but wants to talk to her cardiologist before agreeing to use either of these medications;        Reviewed prescribed diet with patient heart healthy/ADA diet. Review of dietary constraints and the patient states it does get frustrating as she cannot eat the things she wants to eat but she is doing better than what she was with her eating.   The patient states that she is eating more salads now and drinking more water. Has seen big improvements in her dietary habits. Overall feels like she is well balanced with her dietary restrictions; Reminded the patient to stay hydrated and follow the recommendations of the  provider.  Counseled on importance of regular laboratory monitoring as prescribed. Has regular lab work. ;        Discussed plans with patient for ongoing care management follow up and provided patient with direct contact information for care management team;      Provided patient with written educational materials related to hypo and hyperglycemia and importance of correct treatment. Denies any extreme highs or lows;       Reviewed scheduled/upcoming provider appointments including:  Saw the pcp on 11-06-2022 Advised patient, providing education and rationale, to check cbg when you have symptoms of low or high blood sugar and as directed   and record. The patient keeps a record of her blood sugars. The patient states her blood sugar this am was 126. Review of goal of fasting blood sugars <130 and post prandial of <180. Education provided.        call provider for findings outside established parameters;       Referral made to pharmacy team for assistance with ongoing support and education for effective management of medications. The patient works with the pharm D on a regular basis for effective management of medications. ;       Review of patient status, including review of consultants reports, relevant laboratory and other test results, and medications completed;       Advised patient to discuss changes or concerns about DM and DM health and well being with provider;  Screening for signs and symptoms of depression related to chronic disease state;        Assessed social determinant of health barriers;         Symptom Management: Take medications as prescribed   Attend all scheduled provider appointments Call provider office for new concerns or questions  call the Suicide and Crisis Lifeline: 988 call the [place] National Suicide Prevention Lifeline: 6505391004 or TTY: (906)641-3692 TTY 740-394-9655) to talk to a trained counselor call 1-800-273-TALK (toll free, 24 hour hotline) if  experiencing a Mental Health or Behavioral Health Crisis  schedule appointment with eye doctor check feet daily for cuts, sores or redness trim toenails straight across wash and dry feet carefully every day wear comfortable, cotton socks wear comfortable, well-fitting shoes  Follow Up Plan: Telephone follow up appointment with care management team member scheduled for: 01-28-2023 at 1 pm       RNCM Care Management Expected Outcome:  Monitor, Self-Manage and Reduce Symptoms of Heart Failure       Current Barriers:  Knowledge Deficits related to changes in fluid balance with CHF  Chronic Disease Management support and education needs related to effective management of CHF EF is 60 to 65% on 11-08-2020, new Echo scheduled for 08-07-2022  Wt Readings from Last 3 Encounters:  11/06/22 244 lb (110.7 kg)  07/11/22 240 lb 11.2 oz (109.2 kg)  06/24/22 242 lb 9.6 oz (110 kg)  Self reported weight today 11-18-2022 is 240    BP Readings from Last 3 Encounters:  11/06/22 138/66  07/11/22 (!) 142/81  06/24/22 131/72    Planned Interventions: Basic overview and discussion of pathophysiology of Heart Failure reviewed. Review. The patient feels her HF is stable at this time. The patient denies any swelling or edema today. She did have some swelling a couple of weeks ago and feels this is due to the heat. Did have changes in her Lasix dose. Denies any acute changes in her HF or heart health today. Is dealing with sore throat and congestions. Will see the provider this afternoon. Will continue to monitor.  Provided education on low sodium diet. Review of Heart healthy/ADA diet. Education to monitor for changes in water weight versus actual weight. The patient denies any acute changes or shifts in her weight. Education provided. States her weight is 235 to 240 consistently. Reviewed Heart Failure Action Plan in depth  Assessed need for readable accurate scales in home. The patient has scales and can safely  weigh Provided education about placing scale on hard, flat surface Advised patient to weigh each morning after emptying bladder Discussed importance of daily weight and advised patient to weigh and record daily. The patient feels her swelling and edema has leveled out. She denies any acute water shifts. Is monitoring closely. States she is taking her medications as ordered. Reviewed role of diuretics in prevention of fluid overload and management of heart failure. Is taking Lasix daily currently. Knows when she does not have swelling in her ankles to take only as needed. States she has been taking her Lasix daily the last few days due to swelling. Has had changes in the dosage recently and this is working well for her. Education on monitoring for dehydration. Discussed the importance of keeping all appointments with provider. Saw the pcp on 11-06-2022 and will see the cardiologist in August Provided patient with education about the role of exercise in the management of heart failure Advised patient to discuss changes in water weight, questions or concerns  about HF or heart health with provider Screening for signs and symptoms of depression related to chronic disease state  Assessed social determinant of health barriers   Symptom Management: Take medications as prescribed   Attend all scheduled provider appointments Call pharmacy for medication refills 3-7 days in advance of running out of medications Call provider office for new concerns or questions  call the Suicide and Crisis Lifeline: 988 call the [place] National Suicide Prevention Lifeline: (248)205-8788 or TTY: 702-131-6786 TTY 831-770-4631) to talk to a trained counselor call 1-800-273-TALK (toll free, 24 hour hotline) if experiencing a Mental Health or Behavioral Health Crisis  call office if I gain more than 2 pounds in one day or 5 pounds in one week keep legs up while sitting track weight in diary use salt in moderation watch for  swelling in feet, ankles and legs every day weigh myself daily develop a rescue plan follow rescue plan if symptoms flare-up track symptoms and what helps feel better or worse dress right for the weather, hot or cold  Follow Up Plan: Telephone follow up appointment with care management team member scheduled for: 01-28-2023 at 1 pm       RNCM Care Management Expected Outcome:  Monitor, Self-Manage, and Reduce Symptoms of Hypertension       Current Barriers:  Chronic Disease Management support and education needs related to effective management of HTN  BP Readings from Last 3 Encounters:  11/06/22 138/66  07/11/22 (!) 142/81  06/24/22 131/72    Planned Interventions: Evaluation of current treatment plan related to hypertension self management and patient's adherence to plan as established by provider. The patient states her blood pressures have been stable. She states she is having a rough day. She states that she has something going on. Her throat is sore, she has been having a fever and her she feels bad. She is going to the urgent care at 530 pm for evaluation. Provided education to patient re: stroke prevention, s/s of heart attack and stroke; Reviewed prescribed diet heart healthy/ADA diet. Review and discussed monitoring for hidden sodium and sugars in food and how to effectively balance food intake   Reviewed medications with patient and discussed importance of compliance. Is compliant with medications. Works with the pharm D on a regular basis for effective management of medications;  Discussed plans with patient for ongoing care management follow up and provided patient with direct contact information for care management team; Advised patient, providing education and rationale, to monitor blood pressure daily and record, calling PCP for findings outside established parameters. The patient states that her blood pressures have been good at home. The patient does have know "white coat  syndrome" Reviewed scheduled/upcoming provider appointments including: saw pcp on 11-06-2022. Sees specialist on a regular basis. Advised patient to discuss changes in blood pressures and heart health with provider; Provided education on prescribed diet heart healthy/ADA diet. Education and review. Will send email information for healthy planning for meals ;  Discussed complications of poorly controlled blood pressure such as heart disease, stroke, circulatory complications, vision complications, kidney impairment, sexual dysfunction;  Screening for signs and symptoms of depression related to chronic disease state;  Assessed social determinant of health barriers;  Was approved for Orencia for her RA and is happy about this. Takes an injection once a week and she can tell a positive difference in her RA pain. She is thankful for this and thankful for the support she gets from pharm D with cost constraints related  to medications.   Symptom Management: Take medications as prescribed   Attend all scheduled provider appointments Call provider office for new concerns or questions  call the Suicide and Crisis Lifeline: 988 call the [place] National Suicide Prevention Lifeline: 318 255 6661 or TTY: 6044245080 TTY 765-789-7570) to talk to a trained counselor call 1-800-273-TALK (toll free, 24 hour hotline) if experiencing a Mental Health or Behavioral Health Crisis  check blood pressure 3 times per week choose a place to take my blood pressure (home, clinic or office, retail store) write blood pressure results in a log or diary learn about high blood pressure keep a blood pressure log take blood pressure log to all doctor appointments call doctor for signs and symptoms of high blood pressure develop an action plan for high blood pressure keep all doctor appointments take medications for blood pressure exactly as prescribed report new symptoms to your doctor  Follow Up Plan: Telephone follow up  appointment with care management team member scheduled for: 01-28-2023 at 1 pm           Our next appointment is by telephone on 01-28-2023 at 1 pm  Please call the care guide team at [phone] if you need to cancel or reschedule your appointment.   If you are experiencing a Mental Health or Behavioral Health Crisis or need someone to talk to, please call the Suicide and Crisis Lifeline: 988 call the [place] National Suicide Prevention Lifeline: 949-374-6657 or TTY: 732-875-6617 TTY 313-817-9669) to talk to a trained counselor call 1-800-273-TALK (toll free, 24 hour hotline)   Patient verbalizes understanding of instructions and care plan provided today and agrees to view in MyChart. Active MyChart status and patient understanding of how to access instructions and care plan via MyChart confirmed with patient.       Alto Denver RN, MSN, CCM RN Care Manager  [facility]  Ambulatory Care Management  Direct Number: [phone]

## 2023-01-02 NOTE — Patient Outreach (Signed)
Care Management   Visit Note  01/02/2023 Name: Diana Harmon MRN: [id number] DOB: Aug 11, 1945  Subjective: Diana Harmon is a 77 y.o. year old female who is a primary care patient of Smitty Cords, DO. The Care Management team was consulted for assistance.      Engaged with patient spoke with patient by telephone.    Goals Addressed             This Visit's Progress    RNCM Care Management  Expected Outcome:  Monitor, Self-Manage and Reduce Symptoms of Diabetes       Current Barriers:  Chronic Disease Management support and education needs related to effective management of DM Lab Results  Component Value Date   HGBA1C 7.0 (H) 10/31/2022     Planned Interventions: Provided education to patient about basic DM disease process. Review of goals of A1C. Patient with elevation from last A1C. The patient still with elevated A1C.The patient was not feeling the best today and did not give readings. She is going to the urgent care today at 530 due to congestion and throat being sore. Reflective listening and support given.  Reviewed medications with patient and discussed importance of medication adherence. The patient is compliant with medications. Also works with pharm D for medications management. States that Trulicity is working well for her. She is happy that she does not have any side effects. Has been approved through 2024 to get Trulicity. Will consider Marcelline Deist or London Pepper but wants to talk to her cardiologist before agreeing to use either of these medications;        Reviewed prescribed diet with patient heart healthy/ADA diet. Review of dietary constraints and the patient states it does get frustrating as she cannot eat the things she wants to eat but she is doing better than what she was with her eating.   The patient states that she is eating more salads now and drinking more water. Has seen big improvements in her dietary habits. Overall feels like she is  well balanced with her dietary restrictions; Reminded the patient to stay hydrated and follow the recommendations of the provider.  Counseled on importance of regular laboratory monitoring as prescribed. Has regular lab work. ;        Discussed plans with patient for ongoing care management follow up and provided patient with direct contact information for care management team;      Provided patient with written educational materials related to hypo and hyperglycemia and importance of correct treatment. Denies any extreme highs or lows;       Reviewed scheduled/upcoming provider appointments including:  Saw the pcp on 11-06-2022 Advised patient, providing education and rationale, to check cbg when you have symptoms of low or high blood sugar and as directed   and record. The patient keeps a record of her blood sugars. The patient states her blood sugar this am was 126. Review of goal of fasting blood sugars <130 and post prandial of <180. Education provided.        call provider for findings outside established parameters;       Referral made to pharmacy team for assistance with ongoing support and education for effective management of medications. The patient works with the pharm D on a regular basis for effective management of medications. ;       Review of patient status, including review of consultants reports, relevant laboratory and other test results, and medications completed;       Advised patient  to discuss changes or concerns about DM and DM health and well being with provider;      Screening for signs and symptoms of depression related to chronic disease state;        Assessed social determinant of health barriers;         Symptom Management: Take medications as prescribed   Attend all scheduled provider appointments Call provider office for new concerns or questions  call the Suicide and Crisis Lifeline: 988 call the [place] National Suicide Prevention Lifeline: 719 355 1592 or TTY:  2627851026 TTY (928)597-8311) to talk to a trained counselor call 1-800-273-TALK (toll free, 24 hour hotline) if experiencing a Mental Health or Behavioral Health Crisis  schedule appointment with eye doctor check feet daily for cuts, sores or redness trim toenails straight across wash and dry feet carefully every day wear comfortable, cotton socks wear comfortable, well-fitting shoes  Follow Up Plan: Telephone follow up appointment with care management team member scheduled for: 01-28-2023 at 1 pm       RNCM Care Management Expected Outcome:  Monitor, Self-Manage and Reduce Symptoms of Heart Failure       Current Barriers:  Knowledge Deficits related to changes in fluid balance with CHF  Chronic Disease Management support and education needs related to effective management of CHF EF is 60 to 65% on 11-08-2020, new Echo scheduled for 08-07-2022  Wt Readings from Last 3 Encounters:  11/06/22 244 lb (110.7 kg)  07/11/22 240 lb 11.2 oz (109.2 kg)  06/24/22 242 lb 9.6 oz (110 kg)  Self reported weight today 11-18-2022 is 240    BP Readings from Last 3 Encounters:  11/06/22 138/66  07/11/22 (!) 142/81  06/24/22 131/72    Planned Interventions: Basic overview and discussion of pathophysiology of Heart Failure reviewed. Review. The patient feels her HF is stable at this time. The patient denies any swelling or edema today. She did have some swelling a couple of weeks ago and feels this is due to the heat. Did have changes in her Lasix dose. Denies any acute changes in her HF or heart health today. Is dealing with sore throat and congestions. Will see the provider this afternoon. Will continue to monitor.  Provided education on low sodium diet. Review of Heart healthy/ADA diet. Education to monitor for changes in water weight versus actual weight. The patient denies any acute changes or shifts in her weight. Education provided. States her weight is 235 to 240 consistently. Reviewed Heart  Failure Action Plan in depth  Assessed need for readable accurate scales in home. The patient has scales and can safely weigh Provided education about placing scale on hard, flat surface Advised patient to weigh each morning after emptying bladder Discussed importance of daily weight and advised patient to weigh and record daily. The patient feels her swelling and edema has leveled out. She denies any acute water shifts. Is monitoring closely. States she is taking her medications as ordered. Reviewed role of diuretics in prevention of fluid overload and management of heart failure. Is taking Lasix daily currently. Knows when she does not have swelling in her ankles to take only as needed. States she has been taking her Lasix daily the last few days due to swelling. Has had changes in the dosage recently and this is working well for her. Education on monitoring for dehydration. Discussed the importance of keeping all appointments with provider. Saw the pcp on 11-06-2022 and will see the cardiologist in August Provided patient with education about the  role of exercise in the management of heart failure Advised patient to discuss changes in water weight, questions or concerns about HF or heart health with provider Screening for signs and symptoms of depression related to chronic disease state  Assessed social determinant of health barriers   Symptom Management: Take medications as prescribed   Attend all scheduled provider appointments Call pharmacy for medication refills 3-7 days in advance of running out of medications Call provider office for new concerns or questions  call the Suicide and Crisis Lifeline: 988 call the [place] National Suicide Prevention Lifeline: 956-190-5945 or TTY: 508-481-2062 TTY 936 242 4827) to talk to a trained counselor call 1-800-273-TALK (toll free, 24 hour hotline) if experiencing a Mental Health or Behavioral Health Crisis  call office if I gain more than 2 pounds in  one day or 5 pounds in one week keep legs up while sitting track weight in diary use salt in moderation watch for swelling in feet, ankles and legs every day weigh myself daily develop a rescue plan follow rescue plan if symptoms flare-up track symptoms and what helps feel better or worse dress right for the weather, hot or cold  Follow Up Plan: Telephone follow up appointment with care management team member scheduled for: 01-28-2023 at 1 pm       RNCM Care Management Expected Outcome:  Monitor, Self-Manage, and Reduce Symptoms of Hypertension       Current Barriers:  Chronic Disease Management support and education needs related to effective management of HTN  BP Readings from Last 3 Encounters:  11/06/22 138/66  07/11/22 (!) 142/81  06/24/22 131/72    Planned Interventions: Evaluation of current treatment plan related to hypertension self management and patient's adherence to plan as established by provider. The patient states her blood pressures have been stable. She states she is having a rough day. She states that she has something going on. Her throat is sore, she has been having a fever and her she feels bad. She is going to the urgent care at 530 pm for evaluation. Provided education to patient re: stroke prevention, s/s of heart attack and stroke; Reviewed prescribed diet heart healthy/ADA diet. Review and discussed monitoring for hidden sodium and sugars in food and how to effectively balance food intake   Reviewed medications with patient and discussed importance of compliance. Is compliant with medications. Works with the pharm D on a regular basis for effective management of medications;  Discussed plans with patient for ongoing care management follow up and provided patient with direct contact information for care management team; Advised patient, providing education and rationale, to monitor blood pressure daily and record, calling PCP for findings outside established  parameters. The patient states that her blood pressures have been good at home. The patient does have know "white coat syndrome" Reviewed scheduled/upcoming provider appointments including: saw pcp on 11-06-2022. Sees specialist on a regular basis. Advised patient to discuss changes in blood pressures and heart health with provider; Provided education on prescribed diet heart healthy/ADA diet. Education and review. Will send email information for healthy planning for meals ;  Discussed complications of poorly controlled blood pressure such as heart disease, stroke, circulatory complications, vision complications, kidney impairment, sexual dysfunction;  Screening for signs and symptoms of depression related to chronic disease state;  Assessed social determinant of health barriers;  Was approved for Orencia for her RA and is happy about this. Takes an injection once a week and she can tell a positive difference in her RA  pain. She is thankful for this and thankful for the support she gets from pharm D with cost constraints related to medications.   Symptom Management: Take medications as prescribed   Attend all scheduled provider appointments Call provider office for new concerns or questions  call the Suicide and Crisis Lifeline: 988 call the [place] National Suicide Prevention Lifeline: 9856457589 or TTY: (406)752-6228 TTY 724-302-1701) to talk to a trained counselor call 1-800-273-TALK (toll free, 24 hour hotline) if experiencing a Mental Health or Behavioral Health Crisis  check blood pressure 3 times per week choose a place to take my blood pressure (home, clinic or office, retail store) write blood pressure results in a log or diary learn about high blood pressure keep a blood pressure log take blood pressure log to all doctor appointments call doctor for signs and symptoms of high blood pressure develop an action plan for high blood pressure keep all doctor appointments take medications  for blood pressure exactly as prescribed report new symptoms to your doctor  Follow Up Plan: Telephone follow up appointment with care management team member scheduled for: 01-28-2023 at 1 pm            Consent to Services:  Patient was given information about care management services, agreed to services, and gave verbal consent to participate.   Plan: Telephone follow up appointment with care management team member scheduled for: 01-28-2023 at 1 pm  Alto Denver RN, MSN, CCM RN Care Manager  [facility]  Ambulatory Care Management  Direct Number: [phone]

## 2023-01-02 NOTE — ED Provider Notes (Addendum)
[facility]    CSN: [id number] Arrival date & time: 01/02/23  1730      History   Chief Complaint Chief Complaint  Patient presents with   Cough   Ear Pain   Headache         HPI Diana Harmon is a 77 y.o. female.   HPI  History obtained from  daughter and the patient. Diana Harmon presents for coughing, ear pain, sore throat, rhinorrhea, headache, body aches, chills and fever.  Tmax 101.6 F. Symptoms started on Tuesday. Her daughter has COVID. Home COVID test was negative.      Past Medical History:  Diagnosis Date   Anemia    Arthritis    legs, knees, back, wrists   Diabetes mellitus without complication (HCC)    Hypertension    Nausea    Patient is [name]    S/P TAVR (transcatheter aortic valve replacement) 11/09/2019   s/p TAVR with a 26 mm Edwards S3U via the TF approach by Dr. Clifton [facility] [employer] [facility]   Severe aortic stenosis    SIRS (systemic inflammatory response syndrome) (HCC) 11/2019   Sjogren's disease (HCC)    Thyroid disease     Patient Active Problem List   Diagnosis Date Noted   Rheumatoid arthritis of multiple sites with negative rheumatoid factor (HCC) 03/06/2022   Protrusion of intervertebral disc of thoracic region 02/19/2021   Sjogren's syndrome with inflammatory arthritis (HCC) 02/19/2021   S/P TAVR (transcatheter aortic valve replacement) 11/09/2019   Patient is [name]    Severe aortic stenosis    Thyroid disease    Chronic heart failure with preserved ejection fraction (HFpEF) (HCC) 09/30/2019   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/22/2019   Arthralgia 04/22/2019   Normocytic anemia 02/17/2019   Primary osteoarthritis of right knee 02/06/2018   Morbid obesity (HCC) 11/05/2017   Neutropenia (HCC) 04/24/2017   Vitamin D deficiency 10/08/2016   Meniere's disease of right ear 07/04/2016   TIA (transient ischemic attack) 07/04/2016   Nonrheumatic aortic valve stenosis 07/04/2016    Seasonal allergies 01/30/2016   Recurrent UTI 10/26/2015   Iron deficiency anemia due to chronic blood loss 02/28/2015   History of adenomatous polyp of colon 02/13/2015   Osteoarthritis of both knees 12/05/2014   Acid reflux 12/05/2014   Benign neoplasm of colon 12/05/2014   Type 2 diabetes mellitus with other specified complication (HCC) 12/05/2014   Essential hypertension 12/05/2014   Hyperlipidemia associated with type 2 diabetes mellitus (HCC) 12/05/2014   Arthritis, degenerative 12/05/2014   Anemia due to multiple mechanisms 12/05/2014   Abnormal liver enzymes 12/05/2014   Anemia 12/05/2014   Morbid (severe) obesity due to excess calories (HCC) 12/05/2014    Past Surgical History:  Procedure Laterality Date   CARDIAC CATHETERIZATION  10/19/2019   COLON SURGERY     caudarized polyps; rectal carcinoid excision ~ 2011   COLONOSCOPY     COLONOSCOPY WITH PROPOFOL N/A 03/13/2015   Procedure: COLONOSCOPY WITH PROPOFOL;  Surgeon: Scot Jun, MD;  Location: [facility] ENDOSCOPY;  Service: Endoscopy;  Laterality: N/A;   ESOPHAGOGASTRODUODENOSCOPY N/A 03/13/2015   Procedure: ESOPHAGOGASTRODUODENOSCOPY (EGD);  Surgeon: Scot Jun, MD;  Location: [facility] ENDOSCOPY;  Service: Endoscopy;  Laterality: N/A;   INTRAOPERATIVE TRANSTHORACIC ECHOCARDIOGRAM N/A 11/09/2019   Procedure: INTRAOPERATIVE TRANSTHORACIC ECHOCARDIOGRAM;  Surgeon: Kathleene Hazel, MD;  Location: [facility] OR;  Service: Open Heart Surgery;  Laterality: N/A;   PARATHYROIDECTOMY  11/20/2004   right superior parathyroidecomy for primary hyperparathyroidism/parathyroid adenoma  RIGHT/LEFT HEART CATH AND CORONARY ANGIOGRAPHY N/A 10/19/2019   Procedure: RIGHT/LEFT HEART CATH AND CORONARY ANGIOGRAPHY;  Surgeon: Yvonne Kendall, MD;  Location: [facility] INVASIVE CV LAB;  Service: Cardiovascular;  Laterality: N/A;   THYROIDECTOMY     TRANSCATHETER AORTIC VALVE REPLACEMENT, TRANSFEMORAL N/A 11/09/2019   Procedure: TRANSCATHETER AORTIC  VALVE REPLACEMENT, TRANSFEMORAL;  Surgeon: Kathleene Hazel, MD;  Location: [facility] OR;  Service: Open Heart Surgery;  Laterality: N/A;    OB History   No obstetric history on file.      Home Medications    Prior to Admission medications   Medication Sig Start Date End Date Taking? Authorizing Provider  ACCU-CHEK GUIDE test strip USE TO CHECK BLOOD SUGAR TWICE DAILY 08/20/22  Yes [name], Netta Neat, DO  Accu-Chek Softclix Lancets lancets USE TO CHECK BLOOD GLUCOSE TWICE DAILY 08/20/22  Yes [name], Netta Neat, DO  acetaminophen (TYLENOL) 500 MG tablet Take 1,000 mg by mouth every 6 (six) hours as needed for moderate pain.    Yes [provider]  Artificial Tear Solution (GENTEAL TEARS) 0.1-0.2-0.3 % SOLN Place 1 drop into both eyes 3 (three) times daily as needed (dry/irritated eyes.).   Yes [provider]  aspirin 81 MG chewable tablet Chew 1 tablet (81 mg total) by mouth daily. 11/11/19  Yes Janetta Hora, PA-C  B COMPLEX-C PO Take 10 mLs by mouth daily.   Yes [provider]  baclofen (LIORESAL) 10 MG tablet TAKE 1 TO 2 TABLETS BY MOUTH TWICE DAILY AS NEEDED FOR MUSCLE SPASM 03/11/22  Yes [name], Netta Neat, DO  benzonatate (TESSALON) 100 MG capsule Take 1 capsule (100 mg total) by mouth every 8 (eight) hours. 01/02/23  Yes Giuliana Handyside, Seward Meth, DO  Blood Glucose Monitoring Suppl (ACCU-CHEK GUIDE) w/Device KIT Use to check blood sugar twice per day 02/05/22  Yes [name], Netta Neat, DO  Cholecalciferol (VITAMIN D3) 125 MCG (5000 UT) TABS Take 5,000 Units by mouth daily.   Yes [provider]  cyanocobalamin 1000 MCG tablet Take by mouth every other day.   Yes [provider]  Ferrous Sulfate (IRON) 325 (65 Fe) MG TABS TAKE 1 TABLET BY MOUTH TWICE DAILY WITH A MEAL 06/05/18  Yes [name], Netta Neat, DO  furosemide (LASIX) 40 MG tablet Take 1 tablet (40 mg total) by mouth daily. 11/06/22  Yes [name], Netta Neat, DO   hydrochlorothiazide (HYDRODIURIL) 25 MG tablet Take 1 tablet by mouth once daily 04/05/22  Yes Lanier Prude, MD  hydroxychloroquine (PLAQUENIL) 200 MG tablet Take 1 tablet by mouth 2 (two) times daily. 02/19/21  Yes [provider]  lactobacillus acidophilus (BACID) TABS tablet Take 1 tablet by mouth daily as needed (digestive health.).   Yes [provider]  Lancets Misc. KIT Use to check blood sugar twice daily 11/18/19  Yes [provider]  levocetirizine (XYZAL) 5 MG tablet TAKE 1 TABLET BY MOUTH ONCE DAILY IN THE EVENING Patient taking differently: Take 1/2 tablet (2.5 mg) daily in the evening as needed for allergies 12/28/19  Yes [name], Alexander J, DO  losartan (COZAAR) 50 MG tablet Take 1 tablet by mouth once daily Patient taking differently: Take 25 mg by mouth daily. 04/05/22  Yes Lanier Prude, MD  magnesium oxide (MAG-OX) 400 MG tablet Take 400 mg by mouth daily.   Yes [provider]  mupirocin ointment (BACTROBAN) 2 % Apply 1 Application topically 2 (two) times daily. Patient taking differently: Apply 1 Application topically 2 (two) times daily as needed.  10/29/21  Yes [name], Netta Neat, DO  ORENCIA CLICKJECT 125 MG/ML SOAJ Inject into the skin. 06/18/22  Yes [provider]  promethazine-dextromethorphan (PROMETHAZINE-DM) 6.25-15 MG/5ML syrup Take 2.5-5 mLs by mouth 4 (four) times daily as needed. 01/02/23  Yes Dasean Brow, DO  simvastatin (ZOCOR) 20 MG tablet TAKE 1 TABLET BY MOUTH ONCE DAILY AT  6PM 12/09/22  Yes [name], Alexander J, DO  TRULICITY 0.75 MG/0.5ML SOPN Inject 0.75 mg as directed once a week. 12/27/22  Yes [name], Netta Neat, DO  Cromolyn Sodium (NASAL ALLERGY NA) Place 1 spray into the nose daily as needed (allergies).  09/05/19  [provider]    Family History Family History  Problem Relation Age of Onset   Parkinson's disease Mother    Heart disease Father    Lupus Sister     Aneurysm Son    Lupus Son    Breast cancer Maternal Aunt        mat great aunt    Social History Social History   Tobacco Use   Smoking status: Former    Current packs/day: 0.00    Types: Cigarettes    Start date: 09/05/1959    Quit date: 09/05/1979    Years since quitting: 43.3   Smokeless tobacco: Former  Building services engineer status: Never Used  Substance Use Topics   Alcohol use: Not Currently   Drug use: No     Allergies   Ferumoxytol, Penicillins, Iron, and Other   Review of Systems Review of Systems: negative unless otherwise stated in HPI.      Physical Exam Triage Vital Signs ED Triage Vitals  Encounter Vitals Group     BP      Systolic BP Percentile      Diastolic BP Percentile      Pulse      Resp      Temp      Temp src      SpO2      Weight      Height      Head Circumference      Peak Flow      Pain Score      Pain Loc      Pain Education      Exclude from Growth Chart    No data found.  Updated Vital Signs BP (!) 156/80 (BP Location: Left Arm)   Pulse 82   Temp 99.7 F (37.6 C) (Oral)   Ht 5' (1.524 m)   Wt 108.9 kg   SpO2 98%   BMI 46.87 kg/m   Visual Acuity Right Eye Distance:   Left Eye Distance:   Bilateral Distance:    Right Eye Near:   Left Eye Near:    Bilateral Near:     Physical Exam GEN: non-toxic appearing elderly female in no acute distress, wrapped up in sheets in chair EYES: no scleral injection or discharge CVS: well perfuse, regular rate  RESP: speaking in full sentences without pause, no respiratory distress      UC Treatments / Results  Labs (all labs ordered are listed, but only abnormal results are displayed) Labs Reviewed  SARS CORONAVIRUS 2 BY RT PCR - Abnormal; Notable for the following components:      Result Value   SARS Coronavirus 2 by RT PCR POSITIVE (*)    All other components within normal limits    EKG   Radiology No results found.  Procedures Procedures (including critical  care time)  Medications Ordered in UC Medications - No data to display  Initial Impression / Assessment and Plan / UC Course  I have reviewed the triage vital signs and the nursing notes.  Pertinent labs & imaging results that were available during my care of the patient were reviewed by me and considered in my medical decision making (see chart for details).       Pt is a 77 y.o. female who presents for  respiratory symptoms. Amarelis is afebrile here though had recent antipyretics. Satting well on room air. Overall pt is ill but non-toxic appearing, well hydrated, without respiratory distress COVID testing obtained is positive. Daughter has COVID in the home. Reviewed antiviral therapies and after shared decision making decided against antiviral therapy.  eGFR <40 per recent blood work. Discussed symptomatic treatment.  Explained lack of efficacy of antibiotics in viral disease.  Typical duration of symptoms discussed. Prescribed Tessalon perles and promethazine DM  for cough.   Return and ED precautions given and voiced understanding. Discussed MDM, treatment plan and plan for follow-up with patient and her daughter who agree with plan.     Final Clinical Impressions(s) / UC Diagnoses   Final diagnoses:  COVID-19     Discharge Instructions      Your test for COVID-19 was positive, meaning that you were infected with the novel coronavirus and could give the germ to others.  The recommendations suggest returning to normal activities when, for at least 24 hours, symptoms are improving overall, and if a fever was present, it has been gone without use of a fever-reducing medication.  You should wear a mask for the next 5 days to prevent the spread of disease. Please continue good preventive care measures, including:  frequent hand-washing, avoid touching your face, cover coughs/sneezes, stay out of crowds and keep a 6 foot distance from others.  Go to the nearest hospital emergency room  if fever/cough/breathlessness are severe or illness seems like a threat to life.  If your were prescribed medication. Stop by the pharmacy to pick it up. You can take Tylenol and/or Ibuprofen as needed for fever reduction and pain relief.    For cough: honey 1/2 to 1 teaspoon (you can dilute the honey in water or another fluid).  You can also use guaifenesin and dextromethorphan for cough. You can use a humidifier for chest congestion and cough.  If you don't have a humidifier, you can sit in the bathroom with the hot shower running.      For sore throat: try warm salt water gargles, Mucinex sore throat cough drops or cepacol lozenges, throat spray, warm tea or water with lemon/honey, popsicles or ice, or OTC cold relief medicine for throat discomfort. You can also purchase chloraseptic spray at the pharmacy or dollar store.   For congestion: take a daily anti-histamine like Zyrtec, Claritin, and a oral decongestant, such as pseudoephedrine.  You can also use Flonase 1-2 sprays in each nostril daily. Afrin is also a good option, if you do not have high blood pressure.    It is important to stay hydrated: drink plenty of fluids (water, gatorade/powerade/pedialyte, juices, or teas) to keep your throat moisturized and help further relieve irritation/discomfort.    Return or go to the Emergency Department if symptoms worsen or do not improve in the next few days      ED Prescriptions     Medication Sig Dispense Auth. Provider   benzonatate (TESSALON) 100 MG capsule Take 1 capsule (100 mg total)  by mouth every 8 (eight) hours. 21 capsule Giovannie Scerbo, DO   promethazine-dextromethorphan (PROMETHAZINE-DM) 6.25-15 MG/5ML syrup Take 2.5-5 mLs by mouth 4 (four) times daily as needed. 118 mL Katha Cabal, DO      PDMP not reviewed this encounter.   Katha Cabal, DO 01/05/23 1123    Katha Cabal, DO 01/05/23 1125

## 2023-01-09 ENCOUNTER — Other Ambulatory Visit: Payer: Self-pay | Admitting: *Deleted

## 2023-01-09 DIAGNOSIS — D649 Anemia, unspecified: Secondary | ICD-10-CM

## 2023-01-10 ENCOUNTER — Inpatient Hospital Stay: Payer: Medicare Other | Attending: Internal Medicine

## 2023-01-10 ENCOUNTER — Inpatient Hospital Stay: Payer: Medicare Other | Admitting: Internal Medicine

## 2023-01-10 DIAGNOSIS — Z7985 Long-term (current) use of injectable non-insulin antidiabetic drugs: Secondary | ICD-10-CM | POA: Insufficient documentation

## 2023-01-10 DIAGNOSIS — Z79899 Other long term (current) drug therapy: Secondary | ICD-10-CM | POA: Diagnosis not present

## 2023-01-10 DIAGNOSIS — E538 Deficiency of other specified B group vitamins: Secondary | ICD-10-CM | POA: Insufficient documentation

## 2023-01-10 DIAGNOSIS — Z87891 Personal history of nicotine dependence: Secondary | ICD-10-CM | POA: Insufficient documentation

## 2023-01-10 DIAGNOSIS — D509 Iron deficiency anemia, unspecified: Secondary | ICD-10-CM | POA: Insufficient documentation

## 2023-01-10 DIAGNOSIS — D72819 Decreased white blood cell count, unspecified: Secondary | ICD-10-CM | POA: Insufficient documentation

## 2023-01-10 DIAGNOSIS — M3500 Sicca syndrome, unspecified: Secondary | ICD-10-CM | POA: Insufficient documentation

## 2023-01-10 DIAGNOSIS — E079 Disorder of thyroid, unspecified: Secondary | ICD-10-CM | POA: Diagnosis not present

## 2023-01-10 DIAGNOSIS — Z8616 Personal history of COVID-19: Secondary | ICD-10-CM | POA: Insufficient documentation

## 2023-01-10 DIAGNOSIS — I129 Hypertensive chronic kidney disease with stage 1 through stage 4 chronic kidney disease, or unspecified chronic kidney disease: Secondary | ICD-10-CM | POA: Diagnosis not present

## 2023-01-10 DIAGNOSIS — D709 Neutropenia, unspecified: Secondary | ICD-10-CM | POA: Insufficient documentation

## 2023-01-10 DIAGNOSIS — E1122 Type 2 diabetes mellitus with diabetic chronic kidney disease: Secondary | ICD-10-CM | POA: Diagnosis not present

## 2023-01-10 DIAGNOSIS — D649 Anemia, unspecified: Secondary | ICD-10-CM

## 2023-01-10 DIAGNOSIS — R059 Cough, unspecified: Secondary | ICD-10-CM | POA: Diagnosis not present

## 2023-01-10 DIAGNOSIS — Z803 Family history of malignant neoplasm of breast: Secondary | ICD-10-CM | POA: Insufficient documentation

## 2023-01-10 LAB — VITAMIN B12: Vitamin B-12: 1040 pg/mL — ABNORMAL HIGH (ref 180–914)

## 2023-01-10 LAB — CBC WITH DIFFERENTIAL (CANCER CENTER ONLY)
Abs Immature Granulocytes: 0.01 10*3/uL (ref 0.00–0.07)
Basophils Absolute: 0 10*3/uL (ref 0.0–0.1)
Basophils Relative: 1 %
Eosinophils Absolute: 0.1 10*3/uL (ref 0.0–0.5)
Eosinophils Relative: 2 %
HCT: 30.3 % — ABNORMAL LOW (ref 36.0–46.0)
Hemoglobin: 9.4 g/dL — ABNORMAL LOW (ref 12.0–15.0)
Immature Granulocytes: 0 %
Lymphocytes Relative: 39 %
Lymphs Abs: 1.3 10*3/uL (ref 0.7–4.0)
MCH: 29.6 pg (ref 26.0–34.0)
MCHC: 31 g/dL (ref 30.0–36.0)
MCV: 95.3 fL (ref 80.0–100.0)
Monocytes Absolute: 0.5 10*3/uL (ref 0.1–1.0)
Monocytes Relative: 14 %
Neutro Abs: 1.4 10*3/uL — ABNORMAL LOW (ref 1.7–7.7)
Neutrophils Relative %: 44 %
Platelet Count: 213 10*3/uL (ref 150–400)
RBC: 3.18 MIL/uL — ABNORMAL LOW (ref 3.87–5.11)
RDW: 13.2 % (ref 11.5–15.5)
WBC Count: 3.3 10*3/uL — ABNORMAL LOW (ref 4.0–10.5)
nRBC: 0 % (ref 0.0–0.2)

## 2023-01-10 LAB — IRON AND TIBC
Iron: 58 ug/dL (ref 28–170)
Saturation Ratios: 16 % (ref 10.4–31.8)
TIBC: 372 ug/dL (ref 250–450)
UIBC: 314 ug/dL

## 2023-01-10 LAB — BASIC METABOLIC PANEL - CANCER CENTER ONLY
Anion gap: 8 (ref 5–15)
BUN: 26 mg/dL — ABNORMAL HIGH (ref 8–23)
CO2: 25 mmol/L (ref 22–32)
Calcium: 9 mg/dL (ref 8.9–10.3)
Chloride: 106 mmol/L (ref 98–111)
Creatinine: 1.23 mg/dL — ABNORMAL HIGH (ref 0.44–1.00)
GFR, Estimated: 46 mL/min — ABNORMAL LOW (ref >60)
Glucose, Bld: 108 mg/dL — ABNORMAL HIGH (ref 70–99)
Potassium: 4.1 mmol/L (ref 3.5–5.1)
Sodium: 139 mmol/L (ref 135–145)

## 2023-01-10 LAB — FERRITIN: Ferritin: 40 ng/mL (ref 11–307)

## 2023-01-10 NOTE — Progress Notes (Signed)
 No questions/concerns today.

## 2023-01-10 NOTE — Progress Notes (Signed)
[facility] CONSULT NOTE  Patient Care Team: Smitty Cords, DO as PCP - General (Family Medicine) End, Cristal Deer, MD as PCP - Cardiology (Cardiology) Earna Coder, MD as Consulting Physician (Internal Medicine) Irene Limbo., MD (Ophthalmology) End, Cristal Deer, MD as Consulting Physician (Cardiology) Marlowe Sax, RN as Case Manager (General Practice) Earna Coder, MD as Consulting Physician (Internal Medicine) Ronney Asters Jackelyn Poling, RPH-CPP as Pharmacist  CHIEF COMPLAINTS/PURPOSE OF CONSULTATION:   # AUG 2016- IRON DEFICIENCY ANEMIA-  Possible AVMs of colon [s/p Argon laser; Dr.Elliot;EGD-neg Nov 2016] s/p IV Ferriheme x2 [[id number]; ALLERGY]; Venofer IV [pre-meds]; worsening anemia not responding to iron- November 2020-bone marrow biopsy-erythroid hyperplasia but no dysplasia no blasts. NOV 2020- CT A/P-negative; CXR-NEG.  #Relative B12 deficiency-sublingual B12 [Jan 2021]  #December 2020 -elevated anti-CCP- [s/p evaluation Dr. Caesar Chestnut  # MILD LEUCOPENIA/NEUTROPENIA [facility] 1.2-1.3]-chronic ethnic neutropenia/asymptomatic  # Rectal well diff carcinoid [1.2CM; s/p polypectomy 2011]; Allergy- Etta Quill [Nov 2016]- neck/face swelling [improved with benadryl]; June 2021- TAVR for severe AS  HISTORY OF PRESENTING ILLNESS: Patient accompanied by daughter.  In a wheelchair.  Diana Harmon 77 y.o.  female is here for follow-up for anemia of unclear etiology is here for follow-up.  Patient had COVID 2 weeks ago. Continues to dry cough.   No new shortness of breath. No weight loss no blood in stools or black or stools.  Patient continues to be compliant with her iron tablets.  Review of Systems  Constitutional:  Negative for chills, diaphoresis, fever and weight loss.  HENT:  Negative for nosebleeds and sore throat.   Eyes:  Negative for double vision.  Respiratory:  Negative for cough, hemoptysis, sputum production,  shortness of breath and wheezing.   Cardiovascular:  Negative for chest pain, palpitations, orthopnea and leg swelling.  Gastrointestinal:  Negative for abdominal pain, blood in stool, constipation, diarrhea, heartburn, melena, nausea and vomiting.  Genitourinary:  Negative for dysuria, frequency and urgency.  Musculoskeletal:  Positive for joint pain. Negative for back pain.  Skin: Negative.  Negative for itching and rash.  Neurological:  Negative for dizziness, tingling, focal weakness, weakness and headaches.  Endo/Heme/Allergies:  Does not bruise/bleed easily.  Psychiatric/Behavioral:  Negative for depression. The patient is not nervous/anxious and does not have insomnia.      MEDICAL HISTORY:  Past Medical History:  Diagnosis Date   Anemia    Arthritis    legs, knees, back, wrists   Diabetes mellitus without complication (HCC)    Hypertension    Nausea    Patient is [name]    S/P TAVR (transcatheter aortic valve replacement) 11/09/2019   s/p TAVR with a 26 mm Edwards S3U via the TF approach by Dr. Clifton [facility]   Severe aortic stenosis    SIRS (systemic inflammatory response syndrome) (HCC) 11/2019   Sjogren's disease (HCC)    Thyroid disease     SURGICAL HISTORY: Past Surgical History:  Procedure Laterality Date   CARDIAC CATHETERIZATION  10/19/2019   COLON SURGERY     caudarized polyps; rectal carcinoid excision ~ 2011   COLONOSCOPY     COLONOSCOPY WITH PROPOFOL N/A 03/13/2015   Procedure: COLONOSCOPY WITH PROPOFOL;  Surgeon: Scot Jun, MD;  Location: [facility] ENDOSCOPY;  Service: Endoscopy;  Laterality: N/A;   ESOPHAGOGASTRODUODENOSCOPY N/A 03/13/2015   Procedure: ESOPHAGOGASTRODUODENOSCOPY (EGD);  Surgeon: Scot Jun, MD;  Location: [facility] ENDOSCOPY;  Service: Endoscopy;  Laterality: N/A;   INTRAOPERATIVE TRANSTHORACIC ECHOCARDIOGRAM N/A 11/09/2019   Procedure: INTRAOPERATIVE TRANSTHORACIC  ECHOCARDIOGRAM;  Surgeon: Kathleene Hazel, MD;   Location: [facility] OR;  Service: Open Heart Surgery;  Laterality: N/A;   PARATHYROIDECTOMY  11/20/2004   right superior parathyroidecomy for primary hyperparathyroidism/parathyroid adenoma   RIGHT/LEFT HEART CATH AND CORONARY ANGIOGRAPHY N/A 10/19/2019   Procedure: RIGHT/LEFT HEART CATH AND CORONARY ANGIOGRAPHY;  Surgeon: Yvonne Kendall, MD;  Location: [facility] INVASIVE CV LAB;  Service: Cardiovascular;  Laterality: N/A;   THYROIDECTOMY     TRANSCATHETER AORTIC VALVE REPLACEMENT, TRANSFEMORAL N/A 11/09/2019   Procedure: TRANSCATHETER AORTIC VALVE REPLACEMENT, TRANSFEMORAL;  Surgeon: Kathleene Hazel, MD;  Location: [facility] OR;  Service: Open Heart Surgery;  Laterality: N/A;    SOCIAL HISTORY: Social History   Socioeconomic History   Marital status: Divorced    Spouse name: Not on file   Number of children: 2   Years of education: 12   Highest education level: 12th grade  Occupational History   Occupation: retired-worked with nandicapped adults  Tobacco Use   Smoking status: Former    Current packs/day: 0.00    Types: Cigarettes    Start date: 09/05/1959    Quit date: 09/05/1979    Years since quitting: 43.3   Smokeless tobacco: Former  Building services engineer status: Never Used  Substance and Sexual Activity   Alcohol use: Not Currently   Drug use: No   Sexual activity: Not on file  Other Topics Concern   Not on file  Social History Narrative   Not on file   Social Determinants of Health   Financial Resource Strain: Medium Risk (11/06/2022)   Overall Financial Resource Strain (CARDIA)    Difficulty of Paying Living Expenses: Somewhat hard  Food Insecurity: No Food Insecurity (11/06/2022)   Hunger Vital Sign    Worried About Running Out of Food in the Last Year: Never true    Ran Out of Food in the Last Year: Never true  Transportation Needs: No Transportation Needs (11/06/2022)   PRAPARE - Administrator, Civil Service (Medical): No    Lack of Transportation (Non-Medical): No   Physical Activity: Insufficiently Active (11/06/2022)   Exercise Vital Sign    Days of Exercise per Week: 5 days    Minutes of Exercise per Session: 20 min  Stress: No Stress Concern Present (11/06/2022)   [employer] of Occupational Health - Occupational Stress Questionnaire    Feeling of Stress : Not at all  Social Connections: Moderately Integrated (11/06/2022)   Social Connection and Isolation Panel [NHANES]    Frequency of Communication with Friends and Family: More than three times a week    Frequency of Social Gatherings with Friends and Family: More than three times a week    Attends Religious Services: More than 4 times per year    Active Member of [employer] or Organizations: Yes    Attends Engineer, structural: More than 4 times per year    Marital Status: Divorced  Intimate Partner Violence: Not At Risk (05/24/2022)   Humiliation, Afraid, Rape, and Kick questionnaire    Fear of Current or Ex-Partner: No    Emotionally Abused: No    Physically Abused: No    Sexually Abused: No    FAMILY HISTORY: Family History  Problem Relation Age of Onset   Parkinson's disease Mother    Heart disease Father    Lupus Sister    Aneurysm Son    Lupus Son    Breast cancer Maternal Aunt  mat great aunt    ALLERGIES:  is allergic to ferumoxytol, penicillins, iron, and other.  MEDICATIONS:  Current Outpatient Medications  Medication Sig Dispense Refill   ACCU-CHEK GUIDE test strip USE TO CHECK BLOOD SUGAR TWICE DAILY 200 each 3   Accu-Chek Softclix Lancets lancets USE TO CHECK BLOOD GLUCOSE TWICE DAILY 200 each 2   acetaminophen (TYLENOL) 500 MG tablet Take 1,000 mg by mouth every 6 (six) hours as needed for moderate pain.      Artificial Tear Solution (GENTEAL TEARS) 0.1-0.2-0.3 % SOLN Place 1 drop into both eyes 3 (three) times daily as needed (dry/irritated eyes.).     aspirin 81 MG chewable tablet Chew 1 tablet (81 mg total) by mouth daily.     B COMPLEX-C PO Take 10  mLs by mouth daily.     baclofen (LIORESAL) 10 MG tablet TAKE 1 TO 2 TABLETS BY MOUTH TWICE DAILY AS NEEDED FOR MUSCLE SPASM 60 tablet 5   benzonatate (TESSALON) 100 MG capsule Take 1 capsule (100 mg total) by mouth every 8 (eight) hours. 21 capsule 0   Blood Glucose Monitoring Suppl (ACCU-CHEK GUIDE) w/Device KIT Use to check blood sugar twice per day 1 kit 0   Cholecalciferol (VITAMIN D3) 125 MCG (5000 UT) TABS Take 5,000 Units by mouth daily.     cyanocobalamin 1000 MCG tablet Take by mouth every other day.     Ferrous Sulfate (IRON) 325 (65 Fe) MG TABS TAKE 1 TABLET BY MOUTH TWICE DAILY WITH A MEAL 180 each 1   furosemide (LASIX) 40 MG tablet Take 1 tablet (40 mg total) by mouth daily. 90 tablet 3   hydrochlorothiazide (HYDRODIURIL) 25 MG tablet Take 1 tablet by mouth once daily 90 tablet 3   hydroxychloroquine (PLAQUENIL) 200 MG tablet Take 1 tablet by mouth 2 (two) times daily.     lactobacillus acidophilus (BACID) TABS tablet Take 1 tablet by mouth daily as needed (digestive health.).     Lancets Misc. KIT Use to check blood sugar twice daily     levocetirizine (XYZAL) 5 MG tablet TAKE 1 TABLET BY MOUTH ONCE DAILY IN THE EVENING (Patient taking differently: Take 1/2 tablet (2.5 mg) daily in the evening as needed for allergies) 90 tablet 3   losartan (COZAAR) 50 MG tablet Take 1 tablet by mouth once daily (Patient taking differently: Take 25 mg by mouth daily.) 90 tablet 3   magnesium oxide (MAG-OX) 400 MG tablet Take 400 mg by mouth daily.     mupirocin ointment (BACTROBAN) 2 % Apply 1 Application topically 2 (two) times daily. (Patient taking differently: Apply 1 Application topically 2 (two) times daily as needed.) 22 g 3   ORENCIA CLICKJECT 125 MG/ML SOAJ Inject into the skin.     promethazine-dextromethorphan (PROMETHAZINE-DM) 6.25-15 MG/5ML syrup Take 2.5-5 mLs by mouth 4 (four) times daily as needed. 118 mL 0   simvastatin (ZOCOR) 20 MG tablet TAKE 1 TABLET BY MOUTH ONCE DAILY AT  6PM  90 tablet 0   TRULICITY 0.75 MG/0.5ML SOPN Inject 0.75 mg as directed once a week. 8 mL 3   No current facility-administered medications for this visit.    Patient is obese.  PHYSICAL EXAMINATION: ECOG PERFORMANCE STATUS: 1 - Symptomatic but completely ambulatory  Vitals:   01/10/23 0944  BP: 137/65  Pulse: 74  Temp: (!) 97.4 F (36.3 C)  SpO2: 98%    Filed Weights   01/10/23 0944  Weight: 238 lb 1.6 oz (108 kg)  Physical Exam HENT:     Head: Normocephalic and atraumatic.     Mouth/Throat:     Pharynx: No oropharyngeal exudate.  Eyes:     Pupils: Pupils are equal, round, and reactive to light.  Cardiovascular:     Rate and Rhythm: Normal rate and regular rhythm.  Pulmonary:     Effort: Pulmonary effort is normal. No respiratory distress.     Breath sounds: Normal breath sounds. No wheezing.  Abdominal:     General: Bowel sounds are normal. There is no distension.     Palpations: Abdomen is soft. There is no mass.     Tenderness: There is no abdominal tenderness. There is no guarding or rebound.  Musculoskeletal:        General: No tenderness. Normal range of motion.     Cervical back: Normal range of motion and neck supple.  Skin:    General: Skin is warm.  Neurological:     Mental Status: She is alert and oriented to person, place, and time.  Psychiatric:        Mood and Affect: Affect normal.     Latest Reference Range & Units 01/11/21 10:08  Iron 28 - 170 ug/dL 932  UIBC ug/dL 355  TIBC 732 - 202 ug/dL 542  Saturation Ratios 10.4 - 31.8 % 32 (H)  Ferritin 11 - 307 ng/mL 196  (H): Data is abnormally high  LABORATORY DATA:  I have reviewed the data as listed Lab Results  Component Value Date   WBC 3.3 (L) 01/10/2023   HGB 9.4 (L) 01/10/2023   HCT 30.3 (L) 01/10/2023   MCV 95.3 01/10/2023   PLT 213 01/10/2023   Recent Labs    01/11/22 1032 04/24/22 0812 07/11/22 0941 10/31/22 0804 01/10/23 0939  NA 141 145 141 141 139  K 4.0 4.0 3.8 4.4  4.1  CL 108 105 105 103 106  CO2 27 28 26 30 25   GLUCOSE 198* 148* 119* 184* 108*  BUN 38* 34* 32* 56* 26*  CREATININE 1.27* 1.44* 1.21* 1.53* 1.23*  CALCIUM 9.2 9.2 9.1 9.5 9.0  GFRNONAA 44*  --  46*  --  46*  PROT  --  6.8  --  6.7  --   AST  --  21  --  32  --   ALT  --  14  --  23  --   BILITOT  --  0.4  --  0.3  --     ASSESSMENT & PLAN:  Normocytic anemia #Normocytic anemia unclear etiology; previous history of AVM iron deficiency; September 2020-iron studies negative for deficiency.  Bone marrow biopsy negative for any significant dyspoiesis.  Cytogenetics normal.  # H emoglobin is 9-10 [chronic anemia-? Congential causes]-/? CKD vs others  Slightly lower- ? COVID [aug 2024]; otherwise fairly asymptomatic.Patient is on p.o. iron.  2024- MARCH iron saturation 23% ferritin 39  Iron studies pending today.  stable.                            # White count 3.0 chronic benign ethnic neutropenia-neutrophil count 1.6- stable.  # Relative B12 deficiency-200; March 2023- 5000+- continue sublingual B12 tablets 2-3 times a week. Await B12 level from today.stable.Marland Kitchen  #Chronic kidney disease-stage III mild [fu Nephrology[facility]]-overall stable.  # DISPOSITION: # in 6 months-MD; labs- cbc/bmp/ iron studies/ferritin/ b12---Dr.B     Earna Coder, MD 01/10/2023 10:15 AM

## 2023-01-10 NOTE — Assessment & Plan Note (Addendum)
#  Normocytic anemia unclear etiology; previous history of AVM iron deficiency; September 2020-iron studies negative for deficiency.  Bone marrow biopsy negative for any significant dyspoiesis.  Cytogenetics normal.  # H emoglobin is 9-10 [chronic anemia-? Congential causes]-/? CKD vs others  Slightly lower- ? COVID [aug 2024]; otherwise fairly asymptomatic.Patient is on p.o. iron.  2024- [facility]ARCH iron saturation 23% ferritin 39  Iron studies pending today.  stable.                            # White count 3.0 chronic benign ethnic neutropenia-neutrophil count 1.6- stable.  # Relative B12 deficiency-200; March 2023- 5000+- continue sublingual B12 tablets 2-3 times a week. Await B12 level from today.stable.Marland Kitchen  #Chronic kidney disease-stage III mild [fu Nephrology[facility]]-overall stable.  # DISPOSITION: # in 6 months-MD; labs- cbc/bmp/ iron studies/ferritin/ b12---Dr.B

## 2023-01-13 ENCOUNTER — Ambulatory Visit: Payer: Medicare Other | Admitting: Pharmacist

## 2023-01-13 ENCOUNTER — Other Ambulatory Visit: Payer: Self-pay | Admitting: Family Medicine

## 2023-01-13 DIAGNOSIS — I10 Essential (primary) hypertension: Secondary | ICD-10-CM

## 2023-01-13 DIAGNOSIS — R051 Acute cough: Secondary | ICD-10-CM

## 2023-01-13 DIAGNOSIS — E1169 Type 2 diabetes mellitus with other specified complication: Secondary | ICD-10-CM

## 2023-01-13 DIAGNOSIS — I5032 Chronic diastolic (congestive) heart failure: Secondary | ICD-10-CM

## 2023-01-13 MED ORDER — BENZONATATE 100 MG PO CAPS
100.0000 mg | ORAL_CAPSULE | Freq: Three times a day (TID) | ORAL | 0 refills | Status: DC | PRN
Start: 2023-01-13 — End: 2023-03-17

## 2023-01-13 NOTE — Progress Notes (Unsigned)
01/13/2023 Name: Diana Harmon MRN: [id number] DOB: Dec 06, 1945  Chief Complaint  Patient presents with   Medication Assistance   Medication Management    Diana Harmon is a 77 y.o. year old female who presented for a telephone visit.   They were referred to the pharmacist by their PCP for assistance in managing diabetes, hypertension, hyperlipidemia, and medication access.    Subjective:  Care Team: Primary Care Provider: Smitty Cords, DO  Rheumatologist: Tracey Harries, MD; Next Scheduled Visit: 04/17/2023 Hematologist: Earna Coder, MD; Next Scheduled Visit: 07/10/2023 Nurse Care Manager: Carma Lair, RN; Next Schedule Visit: 01/28/2023  Medication Access/Adherence  Current Pharmacy:  [facility] [address], [place] - [address] [place] [address] Phone: [phone] Fax: [phone]  [email or web address] - [place], [place] - [phone] [facility] [address] [facility] [address] [place] [address] Phone: [phone] Fax: [phone]  [id number] [address]), [place] - [address] [place]) [place] [address] Phone: [phone] Fax: [phone]  [facility] - [place], [place] - [address] [place] [address] Phone: [phone] Fax: [phone]  [id number] [place], [place] - [address] [address] [facility] [address] [place] [address] Phone: [phone] Fax: [phone]   Patient reports affordability concerns with their medications: Yes  Patient reports access/transportation concerns to their pharmacy: No  Patient reports adherence concerns with their medications:  No    From review of chart, note at latest office visit with PCP on 11/06/2022, patient reported swelling and provider recommended change in patient's furosemide dose from 20 mg to 40 mg  daily. Also recommended patient consider SGLT2 inhibitor for fluid balance control/CHF/blood sugar control  Diabetes:  Current medications: Trulicity 0.75 mg weekly   Medications tried in the past: metfomrin  Denies checking home blood sugar recently  Patient denies hypoglycemic s/sx including dizziness, shakiness, sweating.   Current physical activity:  Reports exercise limited by arthritis - walks using walker consistently Prior to recent illness, doing chair exercises from PT/OT and stretching at home ~30 minutes everyday x 3 days/week  Current medication access support: enrolled in patient assistance for Trulicity through [employer] through 05/06/2023    HTN/Chronic HFpEF :  Current medications:  ACEi/ARB/ARNI: losartan 50 mg daily Diuretic regimen: furosemide 40 mg - daily as needed - typically taking ~5 days/week HCTZ 25 mg daily  Denies checking home blood pressure recently Current home weights: 238.6 lbs  Today patient reports that she was taking furosemide 40 mg daily as recommended by PCP in July, but then when seen by Dr. Allena Katz on 12/16/2022, she discussed with provider the importance of adequate hydration and since self- adjusted her furosemide to taking 40 mg daily as needed - typically taking ~5 days/week  Reports having swelling in lower legs/ankles. Reports elevating legs during the day and wearing compression stockings helps  Current physical activity:  Reports exercise limited by arthritis - walks using walker consistently Prior to recent illness, doing chair exercises from PT/OT and stretching at home ~30 minutes everyday x 3 days/week     Objective:  Lab Results  Component Value Date   HGBA1C 7.0 (H) 10/31/2022    Lab Results  Component Value Date   CREATININE 1.23 (H) 01/10/2023   BUN 26 (H) 01/10/2023   NA 139 01/10/2023   K 4.1 01/10/2023  CL 106 01/10/2023   CO2 25 01/10/2023    Lab Results  Component Value Date   CHOL 159 04/24/2022    HDL 76 04/24/2022   LDLCALC 67 04/24/2022   TRIG 77 04/24/2022   CHOLHDL 2.1 04/24/2022   BP Readings from Last 3 Encounters:  01/10/23 137/65  01/02/23 (!) 156/80  11/06/22 138/66   Pulse Readings from Last 3 Encounters:  01/10/23 74  01/02/23 82  11/06/22 74     Medications Reviewed Today   Medications were not reviewed in this encounter       Assessment/Plan:   {Pharmacy A/P Choices:26421}  Follow Up Plan: ***  ***

## 2023-01-15 NOTE — Patient Instructions (Signed)
Goals Addressed             This Visit's Progress    Pharmacy Goals       Our goal A1c is less than 7%. This corresponds with fasting sugars less than 130 and 2 hour after meal sugars less than 180. Please keep a log of your results when checking your blood sugar  Our goal bad cholesterol, or LDL, is less than 70 . This is why it is important to continue taking your simvastatin  Please check your home blood pressure, keep a log of the results and bring this with you to your medical appointments.  Please watch the mail for an envelope from [employer] containing the patient assistance program application. Please complete this application and mail back to [facility] Technician Noreene Larsson [place] along with a copy of your Medicare Part D prescription card and a copy of your proof of income document OR you can bring these documents to the office to have them faxed back to Attention: Pattricia Boss at Fax # [phone]  Feel free to call me with any questions or concerns. I look forward to our next call!  Estelle Grumbles, PharmD, Patsy Baltimore, CPP Clinical Pharmacist [facility] [phone]

## 2023-01-16 ENCOUNTER — Telehealth: Payer: Self-pay | Admitting: Pharmacy Technician

## 2023-01-16 DIAGNOSIS — Z5986 Financial insecurity: Secondary | ICD-10-CM

## 2023-01-16 NOTE — Progress Notes (Signed)
[facility] Customer service manager [facility])                                            [facility] Quality Pharmacy Team    01/16/2023  Teliyah Alzate Fails Feb 13, 1946 [id number]                                      Medication Assistance Referral  Referral From:  [facility] PharmD Estelle Grumbles  Medication/Company: Marcelline Deist / [place] Patient application portion:  Mailed Provider application portion: Faxed  to Dr. Saralyn Pilar Provider address/fax verified via: Office website  Pattricia Boss, CPhT [place]  Office: [phone] Fax: [phone] Email: [email or web address]

## 2023-01-28 ENCOUNTER — Other Ambulatory Visit: Payer: Medicare Other

## 2023-01-28 ENCOUNTER — Other Ambulatory Visit: Payer: Self-pay

## 2023-01-28 NOTE — Patient Outreach (Signed)
Care Management   Visit Note  01/28/2023 Name: Diana Harmon MRN: [id number] DOB: [birth year]  Subjective: Diana Harmon is a 77 y.o. year old female who is a primary care patient of Smitty Cords, DO. The Care Management team was consulted for assistance.      Engaged with patient spoke with patient by telephone.    Goals Addressed             This Visit's Progress    RNCM Care Management  Expected Outcome:  Monitor, Self-Manage and Reduce Symptoms of Diabetes       Current Barriers:  Chronic Disease Management support and education needs related to effective management of DM Lab Results  Component Value Date   HGBA1C 7.0 (H) 10/31/2022     Planned Interventions: Provided education to patient about basic DM disease process. Review of goals of A1C. Patient with elevation from last A1C. The patient still with elevated A1C.Since the last outreach the patient has been to the ER for COVID 19. She is feeling better but she still has a cough. She says she is getting back on track and has her taste back finally. She denies any acute changes in her DM.  Reviewed medications with patient and discussed importance of medication adherence. The patient is compliant with medications. Also works with pharm D for medications management. States that Trulicity is working well for her. She is happy that she does not have any side effects. Has been approved through 2024 to get Trulicity. Will consider Marcelline Deist or London Pepper but wants to talk to her cardiologist before agreeing to use either of these medications. Continues to work with the pharm D on a regular basis.;        Reviewed prescribed diet with patient heart healthy/ADA diet. Review of dietary constraints and the patient states it does get frustrating as she cannot eat the things she wants to eat but she is doing better than what she was with her eating.   The patient states that she is eating more salads now and  drinking more water. Has seen big improvements in her dietary habits. Overall feels like she is well balanced with her dietary restrictions; Reminded the patient to stay hydrated and follow the recommendations of the provider. Lost about 6 pounds when she had COVID but her weight is stable and around 236 to 238.  Counseled on importance of regular laboratory monitoring as prescribed. Has regular lab work. ;        Discussed plans with patient for ongoing care management follow up and provided patient with direct contact information for care management team;      Provided patient with written educational materials related to hypo and hyperglycemia and importance of correct treatment. Denies any extreme highs or lows;       Reviewed scheduled/upcoming provider appointments including:  Sees pcp on a regular basis. Education and support given.  Advised patient, providing education and rationale, to check cbg when you have symptoms of low or high blood sugar and as directed   and record. The patient keeps a record of her blood sugars. The patient states her blood sugar this am was 119. Review of goal of fasting blood sugars <130 and post prandial of <180. Education provided.    01-23-2023 121   01-24-2023 142 132-pm  01-25-2023 143   01-27-2023 170   01-28-2023 119            call provider for findings outside established parameters;  Referral made to pharmacy team for assistance with ongoing support and education for effective management of medications. The patient works with the pharm D on a regular basis for effective management of medications. ;       Review of patient status, including review of consultants reports, relevant laboratory and other test results, and medications completed;       Advised patient to discuss changes or concerns about DM and DM health and well being with provider;      Screening for signs and symptoms of depression related to chronic disease state;        Assessed  social determinant of health barriers;         Symptom Management: Take medications as prescribed   Attend all scheduled provider appointments Call provider office for new concerns or questions  call the Suicide and Crisis Lifeline: 988 call the [place] National Suicide Prevention Lifeline: 3251451661 or TTY: (858)037-7068 TTY 737-514-9387) to talk to a trained counselor call 1-800-273-TALK (toll free, 24 hour hotline) if experiencing a Mental Health or Behavioral Health Crisis  schedule appointment with eye doctor check feet daily for cuts, sores or redness trim toenails straight across wash and dry feet carefully every day wear comfortable, cotton socks wear comfortable, well-fitting shoes  Follow Up Plan: Telephone follow up appointment with care management team member scheduled for: 03-05-2023 at 1 pm       RNCM Care Management Expected Outcome:  Monitor, Self-Manage and Reduce Symptoms of Heart Failure       Current Barriers:  Knowledge Deficits related to changes in fluid balance with CHF  Chronic Disease Management support and education needs related to effective management of CHF EF is 60 to 65% on 11-08-2020, new Echo scheduled for 08-07-2022  Wt Readings from Last 3 Encounters:  01/10/23 238 lb 1.6 oz (108 kg)  01/02/23 240 lb (108.9 kg)  11/06/22 244 lb (110.7 kg)  Self reported weight today 11-18-2022 is 240    BP Readings from Last 3 Encounters:  01/10/23 137/65  01/02/23 (!) 156/80  11/06/22 138/66    Planned Interventions: Basic overview and discussion of pathophysiology of Heart Failure reviewed. Review. The patient feels her HF is stable at this time. The patient denies any swelling or edema today. Her swelling is  better. Did have changes in her Lasix dose. Denies any acute changes in her HF or heart health today. Had COVID the end of August. The patient states she is feeling better now.  Provided education on low sodium diet. Review of Heart healthy/ADA diet.  Education to monitor for changes in water weight versus actual weight. The patient denies any acute changes or shifts in her weight. Education provided. States her weight is 236 to 238 consistently. 01-24-2023 236  01-24-2023 236.2 later in the day  01-25-2023 238  01-25-2023 238   Reviewed Heart Failure Action Plan in depth  Assessed need for readable accurate scales in home. The patient has scales and can safely weigh Provided education about placing scale on hard, flat surface Advised patient to weigh each morning after emptying bladder Discussed importance of daily weight and advised patient to weigh and record daily. The patient feels her swelling and edema has leveled out. She denies any acute water shifts. Is monitoring closely. States she is taking her medications as ordered. Reviewed role of diuretics in prevention of fluid overload and management of heart failure. Is taking Lasix daily currently. Knows when she does not have swelling in her ankles to  take only as needed. States she has been taking her Lasix daily the last few days due to swelling. Has had changes in the dosage recently and this is working well for her. Education on monitoring for dehydration. Discussed the importance of keeping all appointments with provider. Saw the pcp on 11-06-2022 and saw cardiologist recently. The patient knows to call for changes or new needs. Provided patient with education about the role of exercise in the management of heart failure Advised patient to discuss changes in water weight, questions or concerns about HF or heart health with provider Screening for signs and symptoms of depression related to chronic disease state  Assessed social determinant of health barriers   Symptom Management: Take medications as prescribed   Attend all scheduled provider appointments Call pharmacy for medication refills 3-7 days in advance of running out of medications Call provider office for new concerns or  questions  call the Suicide and Crisis Lifeline: 988 call the [place] National Suicide Prevention Lifeline: 737-649-6396 or TTY: 931-246-3378 TTY 337-545-3267) to talk to a trained counselor call 1-800-273-TALK (toll free, 24 hour hotline) if experiencing a Mental Health or Behavioral Health Crisis  call office if I gain more than 2 pounds in one day or 5 pounds in one week keep legs up while sitting track weight in diary use salt in moderation watch for swelling in feet, ankles and legs every day weigh myself daily develop a rescue plan follow rescue plan if symptoms flare-up track symptoms and what helps feel better or worse dress right for the weather, hot or cold  Follow Up Plan: Telephone follow up appointment with care management team member scheduled for: 03-05-2023 at 1 pm       RNCM Care Management Expected Outcome:  Monitor, Self-Manage, and Reduce Symptoms of Hypertension       Current Barriers:  Chronic Disease Management support and education needs related to effective management of HTN  BP Readings from Last 3 Encounters:  01/10/23 137/65  01/02/23 (!) 156/80  11/06/22 138/66    Planned Interventions: Evaluation of current treatment plan related to hypertension self management and patient's adherence to plan as established by provider. The patient states her blood pressures have been stable.  Provided education to patient re: stroke prevention, s/s of heart attack and stroke; Reviewed prescribed diet heart healthy/ADA diet. Review and discussed monitoring for hidden sodium and sugars in food and how to effectively balance food intake   Reviewed medications with patient and discussed importance of compliance. Is compliant with medications. Works with the pharm D on a regular basis for effective management of medications;  Discussed plans with patient for ongoing care management follow up and provided patient with direct contact information for care management  team; Advised patient, providing education and rationale, to monitor blood pressure daily and record, calling PCP for findings outside established parameters. The patient states that her blood pressures have been good at home. The patient does have know "white coat syndrome" 01-23-2023 [phone]  01-24-2023 120/51-80  01-25-2023 138/66-74  01-26-2023 295/28-41  01-27-2023 107/83-89  01-28-2023 118/54-80   Reviewed scheduled/upcoming provider appointments including: saw pcp on 11-06-2022. Sees specialist on a regular basis. Knows to call for new needs or changes.  Advised patient to discuss changes in blood pressures and heart health with provider; Provided education on prescribed diet heart healthy/ADA diet. Education and review. Will send email information for healthy planning for meals ;  Discussed complications of poorly controlled blood pressure such as heart disease,  stroke, circulatory complications, vision complications, kidney impairment, sexual dysfunction;  Screening for signs and symptoms of depression related to chronic disease state;  Assessed social determinant of health barriers;  Was approved for Orencia for her RA and is happy about this. Takes an injection once a week and she can tell a positive difference in her RA pain. She is thankful for this and thankful for the support she gets from pharm D with cost constraints related to medications.   Symptom Management: Take medications as prescribed   Attend all scheduled provider appointments Call provider office for new concerns or questions  call the Suicide and Crisis Lifeline: 988 call the [place] National Suicide Prevention Lifeline: (657)012-5714 or TTY: 856-171-8778 TTY 440-392-0288) to talk to a trained counselor call 1-800-273-TALK (toll free, 24 hour hotline) if experiencing a Mental Health or Behavioral Health Crisis  check blood pressure 3 times per week choose a place to take my blood pressure (home, clinic or office, retail  store) write blood pressure results in a log or diary learn about high blood pressure keep a blood pressure log take blood pressure log to all doctor appointments call doctor for signs and symptoms of high blood pressure develop an action plan for high blood pressure keep all doctor appointments take medications for blood pressure exactly as prescribed report new symptoms to your doctor  Follow Up Plan: Telephone follow up appointment with care management team member scheduled for: 03-05-2023 at 1 pm           Consent to Services:  Patient was given information about care management services, agreed to services, and gave verbal consent to participate.   Plan: Telephone follow up appointment with care management team member scheduled for: 03-05-2023 at 1 pm  Alto Denver RN, MSN, CCM RN Care Manager  [facility]  Ambulatory Care Management  Direct Number: [phone]

## 2023-01-28 NOTE — Patient Instructions (Signed)
Visit Information  Thank you for taking time to visit with me today. Please don't hesitate to contact me if I can be of assistance to you before our next scheduled telephone appointment.  Following are the goals we discussed today:   Goals Addressed             This Visit's Progress    RNCM Care Management  Expected Outcome:  Monitor, Self-Manage and Reduce Symptoms of Diabetes       Current Barriers:  Chronic Disease Management support and education needs related to effective management of DM Lab Results  Component Value Date   HGBA1C 7.0 (H) 10/31/2022     Planned Interventions: Provided education to patient about basic DM disease process. Review of goals of A1C. Patient with elevation from last A1C. The patient still with elevated A1C.Since the last outreach the patient has been to the ER for COVID 19. She is feeling better but she still has a cough. She says she is getting back on track and has her taste back finally. She denies any acute changes in her DM.  Reviewed medications with patient and discussed importance of medication adherence. The patient is compliant with medications. Also works with pharm D for medications management. States that Trulicity is working well for her. She is happy that she does not have any side effects. Has been approved through 2024 to get Trulicity. Will consider Marcelline Deist or London Pepper but wants to talk to her cardiologist before agreeing to use either of these medications. Continues to work with the pharm D on a regular basis.;        Reviewed prescribed diet with patient heart healthy/ADA diet. Review of dietary constraints and the patient states it does get frustrating as she cannot eat the things she wants to eat but she is doing better than what she was with her eating.   The patient states that she is eating more salads now and drinking more water. Has seen big improvements in her dietary habits. Overall feels like she is well balanced with her dietary  restrictions; Reminded the patient to stay hydrated and follow the recommendations of the provider. Lost about 6 pounds when she had COVID but her weight is stable and around 236 to 238.  Counseled on importance of regular laboratory monitoring as prescribed. Has regular lab work. ;        Discussed plans with patient for ongoing care management follow up and provided patient with direct contact information for care management team;      Provided patient with written educational materials related to hypo and hyperglycemia and importance of correct treatment. Denies any extreme highs or lows;       Reviewed scheduled/upcoming provider appointments including:  Sees pcp on a regular basis. Education and support given.  Advised patient, providing education and rationale, to check cbg when you have symptoms of low or high blood sugar and as directed   and record. The patient keeps a record of her blood sugars. The patient states her blood sugar this am was 119. Review of goal of fasting blood sugars <130 and post prandial of <180. Education provided.    01-23-2023 121   01-24-2023 142 132-pm  01-25-2023 143   01-27-2023 170   01-28-2023 119            call provider for findings outside established parameters;       Referral made to pharmacy team for assistance with ongoing support and education for effective management  of medications. The patient works with the pharm D on a regular basis for effective management of medications. ;       Review of patient status, including review of consultants reports, relevant laboratory and other test results, and medications completed;       Advised patient to discuss changes or concerns about DM and DM health and well being with provider;      Screening for signs and symptoms of depression related to chronic disease state;        Assessed social determinant of health barriers;         Symptom Management: Take medications as prescribed   Attend all scheduled  provider appointments Call provider office for new concerns or questions  call the Suicide and Crisis Lifeline: 988 call the [place] National Suicide Prevention Lifeline: 5138357278 or TTY: 214-188-1922 TTY 5483639360) to talk to a trained counselor call 1-800-273-TALK (toll free, 24 hour hotline) if experiencing a Mental Health or Behavioral Health Crisis  schedule appointment with eye doctor check feet daily for cuts, sores or redness trim toenails straight across wash and dry feet carefully every day wear comfortable, cotton socks wear comfortable, well-fitting shoes  Follow Up Plan: Telephone follow up appointment with care management team member scheduled for: 03-05-2023 at 1 pm       RNCM Care Management Expected Outcome:  Monitor, Self-Manage and Reduce Symptoms of Heart Failure       Current Barriers:  Knowledge Deficits related to changes in fluid balance with CHF  Chronic Disease Management support and education needs related to effective management of CHF EF is 60 to 65% on 11-08-2020, new Echo scheduled for 08-07-2022  Wt Readings from Last 3 Encounters:  01/10/23 238 lb 1.6 oz (108 kg)  01/02/23 240 lb (108.9 kg)  11/06/22 244 lb (110.7 kg)  Self reported weight today 11-18-2022 is 240    BP Readings from Last 3 Encounters:  01/10/23 137/65  01/02/23 (!) 156/80  11/06/22 138/66    Planned Interventions: Basic overview and discussion of pathophysiology of Heart Failure reviewed. Review. The patient feels her HF is stable at this time. The patient denies any swelling or edema today. Her swelling is  better. Did have changes in her Lasix dose. Denies any acute changes in her HF or heart health today. Had COVID the end of August. The patient states she is feeling better now.  Provided education on low sodium diet. Review of Heart healthy/ADA diet. Education to monitor for changes in water weight versus actual weight. The patient denies any acute changes or shifts in her  weight. Education provided. States her weight is 236 to 238 consistently. 01-24-2023 236  01-24-2023 236.2 later in the day  01-25-2023 238  01-25-2023 238   Reviewed Heart Failure Action Plan in depth  Assessed need for readable accurate scales in home. The patient has scales and can safely weigh Provided education about placing scale on hard, flat surface Advised patient to weigh each morning after emptying bladder Discussed importance of daily weight and advised patient to weigh and record daily. The patient feels her swelling and edema has leveled out. She denies any acute water shifts. Is monitoring closely. States she is taking her medications as ordered. Reviewed role of diuretics in prevention of fluid overload and management of heart failure. Is taking Lasix daily currently. Knows when she does not have swelling in her ankles to take only as needed. States she has been taking her Lasix daily the last few  days due to swelling. Has had changes in the dosage recently and this is working well for her. Education on monitoring for dehydration. Discussed the importance of keeping all appointments with provider. Saw the pcp on 11-06-2022 and saw cardiologist recently. The patient knows to call for changes or new needs. Provided patient with education about the role of exercise in the management of heart failure Advised patient to discuss changes in water weight, questions or concerns about HF or heart health with provider Screening for signs and symptoms of depression related to chronic disease state  Assessed social determinant of health barriers   Symptom Management: Take medications as prescribed   Attend all scheduled provider appointments Call pharmacy for medication refills 3-7 days in advance of running out of medications Call provider office for new concerns or questions  call the Suicide and Crisis Lifeline: 988 call the [place] National Suicide Prevention Lifeline: 682-573-8377 or TTY:  646-571-0904 TTY 332-106-9321) to talk to a trained counselor call 1-800-273-TALK (toll free, 24 hour hotline) if experiencing a Mental Health or Behavioral Health Crisis  call office if I gain more than 2 pounds in one day or 5 pounds in one week keep legs up while sitting track weight in diary use salt in moderation watch for swelling in feet, ankles and legs every day weigh myself daily develop a rescue plan follow rescue plan if symptoms flare-up track symptoms and what helps feel better or worse dress right for the weather, hot or cold  Follow Up Plan: Telephone follow up appointment with care management team member scheduled for: 03-05-2023 at 1 pm       RNCM Care Management Expected Outcome:  Monitor, Self-Manage, and Reduce Symptoms of Hypertension       Current Barriers:  Chronic Disease Management support and education needs related to effective management of HTN  BP Readings from Last 3 Encounters:  01/10/23 137/65  01/02/23 (!) 156/80  11/06/22 138/66    Planned Interventions: Evaluation of current treatment plan related to hypertension self management and patient's adherence to plan as established by provider. The patient states her blood pressures have been stable.  Provided education to patient re: stroke prevention, s/s of heart attack and stroke; Reviewed prescribed diet heart healthy/ADA diet. Review and discussed monitoring for hidden sodium and sugars in food and how to effectively balance food intake   Reviewed medications with patient and discussed importance of compliance. Is compliant with medications. Works with the pharm D on a regular basis for effective management of medications;  Discussed plans with patient for ongoing care management follow up and provided patient with direct contact information for care management team; Advised patient, providing education and rationale, to monitor blood pressure daily and record, calling PCP for findings outside  established parameters. The patient states that her blood pressures have been good at home. The patient does have know "white coat syndrome" 01-23-2023 [phone]  01-24-2023 120/51-80  01-25-2023 138/66-74  01-26-2023 295/28-41  01-27-2023 107/83-89  01-28-2023 118/54-80   Reviewed scheduled/upcoming provider appointments including: saw pcp on 11-06-2022. Sees specialist on a regular basis. Knows to call for new needs or changes.  Advised patient to discuss changes in blood pressures and heart health with provider; Provided education on prescribed diet heart healthy/ADA diet. Education and review. Will send email information for healthy planning for meals ;  Discussed complications of poorly controlled blood pressure such as heart disease, stroke, circulatory complications, vision complications, kidney impairment, sexual dysfunction;  Screening for signs and symptoms  of depression related to chronic disease state;  Assessed social determinant of health barriers;  Was approved for Orencia for her RA and is happy about this. Takes an injection once a week and she can tell a positive difference in her RA pain. She is thankful for this and thankful for the support she gets from pharm D with cost constraints related to medications.   Symptom Management: Take medications as prescribed   Attend all scheduled provider appointments Call provider office for new concerns or questions  call the Suicide and Crisis Lifeline: 988 call the [place] National Suicide Prevention Lifeline: 256 235 6667 or TTY: 732-583-4184 TTY 865-755-2548) to talk to a trained counselor call 1-800-273-TALK (toll free, 24 hour hotline) if experiencing a Mental Health or Behavioral Health Crisis  check blood pressure 3 times per week choose a place to take my blood pressure (home, clinic or office, retail store) write blood pressure results in a log or diary learn about high blood pressure keep a blood pressure log take blood pressure  log to all doctor appointments call doctor for signs and symptoms of high blood pressure develop an action plan for high blood pressure keep all doctor appointments take medications for blood pressure exactly as prescribed report new symptoms to your doctor  Follow Up Plan: Telephone follow up appointment with care management team member scheduled for: 03-05-2023 at 1 pm           Our next appointment is by telephone on 03-05-2023 at 1 pm  Please call the care guide team at [phone] if you need to cancel or reschedule your appointment.   If you are experiencing a Mental Health or Behavioral Health Crisis or need someone to talk to, please call the Suicide and Crisis Lifeline: 988 call the [place] National Suicide Prevention Lifeline: (432)498-9206 or TTY: 225-555-0680 TTY 815-485-9798) to talk to a trained counselor call 1-800-273-TALK (toll free, 24 hour hotline)   Patient verbalizes understanding of instructions and care plan provided today and agrees to view in MyChart. Active MyChart status and patient understanding of how to access instructions and care plan via MyChart confirmed with patient.     Telephone follow up appointment with care management team member scheduled for: 03-05-2023 at 1 pm  Alto Denver RN, MSN, CCM RN Care Manager  [facility]  Ambulatory Care Management  Direct Number: [phone]

## 2023-01-30 ENCOUNTER — Telehealth: Payer: Self-pay | Admitting: Pharmacy Technician

## 2023-01-30 DIAGNOSIS — Z5986 Financial insecurity: Secondary | ICD-10-CM

## 2023-01-30 NOTE — Progress Notes (Signed)
[facility])                                            [facility] Quality Pharmacy Team    01/30/2023  Diana Harmon Dec 29, 1945 [id number]  Received both patient and provider portion(s) of patient assistance application(s) for [place]. Faxed completed application and required documents into [employer].    Pattricia Boss, CPhT [place]  Office: [phone] Fax: [phone] Email: [email or web address]

## 2023-02-04 ENCOUNTER — Telehealth: Payer: Self-pay | Admitting: Pharmacy Technician

## 2023-02-04 DIAGNOSIS — Z5986 Financial insecurity: Secondary | ICD-10-CM

## 2023-02-04 NOTE — Progress Notes (Signed)
[facility])                                            [facility] Quality Pharmacy Team    02/04/2023  Diana Harmon May 23, 1945 [id number]  Care coordination call placed to [employer] in regard to [facility] application.  Spoke to [place] who informs patient is APPROVED 01/30/2023-05/05/2024. She informs initial shipment as well as subsequent refills will process automatically and be shipped to the patient's address on file. If patient feels current supply is not sufficient to last until next supply arrives, patient may call [employer] at [phone].  Successful outreach to patient. HIPAA verified. Patient was made aware of the above information.  Pattricia Boss, CPhT [place]  Office: [phone] Fax: [phone] Email: [email or web address]

## 2023-02-10 ENCOUNTER — Ambulatory Visit: Payer: Medicare Other | Admitting: Pharmacist

## 2023-02-10 DIAGNOSIS — I10 Essential (primary) hypertension: Secondary | ICD-10-CM

## 2023-02-10 DIAGNOSIS — I5032 Chronic diastolic (congestive) heart failure: Secondary | ICD-10-CM

## 2023-02-10 DIAGNOSIS — E1169 Type 2 diabetes mellitus with other specified complication: Secondary | ICD-10-CM

## 2023-02-10 NOTE — Progress Notes (Signed)
02/10/2023 Name: Diana Harmon MRN: [id number] DOB: 12-08-1945  Chief Complaint  Patient presents with   Medication Assistance   Medication Management    Diana Harmon is a 77 y.o. year old female who presented for a telephone visit.   They were referred to the pharmacist by their PCP for assistance in managing diabetes, hypertension, hyperlipidemia, and medication access.      Subjective:   Care Team: Primary Care Provider: Smitty Cords, DO  Rheumatologist: Diana Harries, MD; Next Scheduled Visit: 04/17/2023 Hematologist: Diana Coder, MD; Next Scheduled Visit: 07/10/2023 Nurse Care Manager: Diana Lair, RN; Next Schedule Visit: 03/05/2023 Cardiologist: Diana Kendall, MD; Next Scheduled Visit: 06/25/2023  Medication Access/Adherence  Current Pharmacy:  [facility] [address] ([address]), [place] - [address] [place]) [place] [address] Phone: [phone] Fax: [phone]  [facility] [address] [place] - [address] [place] [address] Phone: [phone] Fax: [phone]  [email or web address] - [place], [place] - [phone] [facility] [address] [place] [address] Phone: [phone] Fax: [phone]  [facility] - [place], [place] - [address] [place] [address] Phone: [phone] Fax: [phone]  [id number], [place] - [address] [facility] [address] [place] [address] Phone: [phone] Fax: [phone]   Patient reports affordability concerns with their medications: No Patient reports access/transportation concerns to their pharmacy: No  Patient reports adherence concerns with their medications:  No      Note patient receiving her Orencia through [employer] patient assistance   From review of encounter in chart  from CPhT Diana Harmon, note patient approved for patient assistance for Farxiga from [employer] 01/30/2023-05/05/2024  - Today patient reports that she has not yet received shipment of Farxiga from program, but will watch for this in the mail   Diabetes:   Current medications: Trulicity 0.75 mg weekly    Medications tried in the past: metformin   Reports recent morning fasting readings ranging 98-131   Patient denies hypoglycemic s/sx including dizziness, shakiness, sweating.    Current physical activity:  Reports exercise limited by arthritis - walks using walker consistently Reports since now feeling better since covid-19 infection, starting doing chair exercises from PT/OT and stretching at home again and walking more   Current medication access support:  - Enrolled in patient assistance for Trulicity through [employer] through 05/06/2023 - Enrolled in patient assistance for Farxiga through [employer] through 05/05/2024       HTN/Chronic HFpEF :   Current medications:  ACEi/ARB/ARNI: losartan 50 mg daily Diuretic regimen: furosemide 40 mg - daily as needed - typically taking ~3 days/week HCTZ 25 mg daily   Last checked blood pressure today, reading: 138/67, HR 76  Reports monitoring home weight    Reports has noticed an improvement in leg swelling recently. Reports elevating legs during the day and wearing compression stockings helps   Current physical activity:  Reports exercise limited by arthritis - walks using walker consistently Prior to recent illness, doing chair exercises from PT/OT and stretching at home ~30 minutes everyday x 3 days/week  Current medication access support:  - Enrolled in patient assistance for Farxiga through [employer] through 05/05/2024  Objective:  Lab Results  Component Value Date   HGBA1C 7.0 (H) 10/31/2022    Lab Results  Component Value Date  CREATININE 1.23 (H) 01/10/2023   BUN 26 (H) 01/10/2023   NA 139 01/10/2023   K 4.1 01/10/2023   CL 106  01/10/2023   CO2 25 01/10/2023  Calculated CrCl (based on adjusted body weight) ~43 ml/min  Lab Results  Component Value Date   CHOL 159 04/24/2022   HDL 76 04/24/2022   LDLCALC 67 04/24/2022   TRIG 77 04/24/2022   CHOLHDL 2.1 04/24/2022    Medications Reviewed Today     Reviewed by Diana Harmon, RPH-CPP (Pharmacist) on 02/10/23 at 1326  Med List Status: <None>   Medication Order Taking? Sig Documenting Provider Last Dose Status Informant  ACCU-CHEK GUIDE test strip [id number]  USE TO CHECK BLOOD SUGAR TWICE DAILY Diana Cords, DO  Active   Accu-Chek Softclix Lancets lancets [id number]  USE TO CHECK BLOOD GLUCOSE TWICE DAILY Althea Charon, Netta Neat, DO  Active   acetaminophen (TYLENOL) 500 MG tablet [id number]  Take 1,000 mg by mouth every 6 (six) hours as needed for moderate pain.  [provider]  Active Child  Artificial Tear Solution (GENTEAL TEARS) 0.1-0.2-0.3 % SOLN [id number]  Place 1 drop into both eyes 3 (three) times daily as needed (dry/irritated eyes.). [provider]  Active Child  aspirin 81 MG chewable tablet [id number]  Chew 1 tablet (81 mg total) by mouth daily. Allena Katz  Active Child  B COMPLEX-C PO [id number]  Take 10 mLs by mouth daily. [provider]  Active Child  baclofen (LIORESAL) 10 MG tablet [id number]  TAKE 1 TO 2 TABLETS BY MOUTH TWICE DAILY AS NEEDED FOR MUSCLE SPASM [name], Netta Neat, DO  Active   benzonatate (TESSALON) 100 MG capsule [id number] No Take 1 capsule (100 mg total) by mouth 3 (three) times daily as needed for cough.  Patient not taking: Reported on 02/10/2023   Diana Cords, DO Not Taking Active   Blood Glucose Monitoring Suppl (ACCU-CHEK GUIDE) w/Device KIT [id number]  Use to check blood sugar twice per day Diana Cords, DO  Active   Cholecalciferol (VITAMIN D3) 125 MCG (5000 UT) TABS [id number]  Take 5,000 Units by mouth daily. [provider]   Active Child    Discontinued 09/05/19 1316   cyanocobalamin 1000 MCG tablet [id number]  Take by mouth every other day. [provider]  Active            Med Note Estelle Grumbles A   Mon Jan 13, 2023 10:15 AM)    Ferrous Sulfate (IRON) 325 (65 Fe) MG TABS [id number]  TAKE 1 TABLET BY MOUTH TWICE DAILY WITH A MEAL [name], Netta Neat, DO  Active Child  furosemide (LASIX) 40 MG tablet [id number] Yes Take 1 tablet (40 mg total) by mouth daily.  Patient taking differently: Take 40 mg by mouth daily as needed.   Diana Cords, DO Taking Active   hydrochlorothiazide (HYDRODIURIL) 25 MG tablet [id number] Yes Take 1 tablet by mouth once daily Lanier Prude, MD Taking Active   hydroxychloroquine (PLAQUENIL) 200 MG tablet [id number]  Take 1 tablet by mouth 2 (two) times daily. [provider]  Active   lactobacillus acidophilus (BACID) TABS tablet [id number]  Take 1 tablet by mouth daily as needed (digestive health.). [provider]  Active            Med Note Alphonzo Dublin   Wed Nov 03, 2019  3:06 PM)    Lancets Misc. KIT [id number]  Use to check blood sugar twice daily  [provider]  Active   levocetirizine (XYZAL) 5 MG tablet [id number]  TAKE 1 TABLET BY MOUTH ONCE DAILY IN THE EVENING  Patient taking differently: Take 1/2 tablet (2.5 mg) every other day in the evening as needed for allergies   Diana Cords, DO  Active   losartan (COZAAR) 50 MG tablet [id number] Yes Take 1 tablet by mouth once daily Lanier Prude, MD Taking Active   magnesium oxide (MAG-OX) 400 MG tablet [id number]  Take 400 mg by mouth daily. [provider]  Active   mupirocin ointment (BACTROBAN) 2 % [id number]  Apply 1 Application topically 2 (two) times daily.  Patient taking differently: Apply 1 Application topically 2 (two) times daily as needed.   Diana Cords, DO  Active   ORENCIA CLICKJECT 125 MG/ML Ivory Broad [id number]  Inject into the  skin. [provider]  Active   simvastatin (ZOCOR) 20 MG tablet [id number]  TAKE 1 TABLET BY MOUTH ONCE DAILY AT  [address], DO  Active   TRULICITY 0.75 MG/0.5ML SOPN [id number] Yes Inject 0.75 mg as directed once a week. Diana Cords, DO Taking Active               Assessment/Plan:   Renal dose adjustment:  Patient currently takes levocetirizine 5 mg QHS as needed for allergy symptoms Creatinine lab work, calculate CrCl (based on adjusted body weight): ~43 mL/min Based on renal function, advise patient to decrease levocetirizine dose to 1/2 tablet (2.5 mg) every other day in evening as needed for allergy symptoms  Diabetes: - Currently controlled - Reviewed long term cardiovascular and renal outcomes of uncontrolled blood sugar - Encouraged patient to have regular well-balanced meals throughout the day, while controlling carbohydrate portion sizes - Encouraged patient to continue to use blood sugar checks as feedback on dietary choices - Encouraged patient to continue to monitor home blood sugar, keep log and bring record with her to medical appointments - Will collaborate with PCP and CPhT to aid patient with applying for re-enrollment in Trulicity patient assistance program from [place]     HTN/Chronic HFpEF : - Reviewed appropriate blood pressure monitoring technique and reviewed goal blood pressure - Have reviewed to weigh daily and when to contact PCP/cardiology with weight gain - Review counseling on Farxiga, including mechanism of action, side effects, and benefits. Discussed potential side effects of dehydration, genitourinary infections. Encouraged adequate hydration and genital hygiene. Advised on sick day rules (if a day with significantly reduced oral intake, serious vomiting, or diarrhea, hold SGLT2). Patient verbalized understanding Patient plans to start taking Farxiga 10 mg daily once as soon as received from assistance  program Remind patient of plan per PCP to continue to use furosemide as needed, expecting to decrease frequency with start of Farxiga      Follow Up Plan: Clinical Pharmacist will follow up with patient by telephone on 03/14/2023 at 9:15 AM     Estelle Grumbles, PharmD, [facility] [phone]

## 2023-02-10 NOTE — Patient Instructions (Addendum)
Goals Addressed             This Visit's Progress    Pharmacy Goals       Our goal A1c is less than 7%. This corresponds with fasting sugars less than 130 and 2 hour after meal sugars less than 180. Please keep a log of your results when checking your blood sugar  Our goal bad cholesterol, or LDL, is less than 70 . This is why it is important to continue taking your simvastatin  Please check your home blood pressure, keep a log of the results and bring this with you to your medical appointments.  Please watch the mail for an envelope from [employer] containing the patient assistance program application. Please complete this application and mail back to [facility] Technician Noreene Larsson [place] along with a copy of your Medicare Part D prescription card and a copy of your proof of income document OR you can bring these documents to the office to have them faxed back to Attention: Pattricia Boss at Fax # [phone]  Feel free to call me with any questions or concerns. I look forward to our next call!  Estelle Grumbles, PharmD, [place], CPP Clinical Pharmacist [facility] [phone]

## 2023-02-20 ENCOUNTER — Telehealth: Payer: Self-pay | Admitting: Pharmacy Technician

## 2023-02-20 DIAGNOSIS — Z5986 Financial insecurity: Secondary | ICD-10-CM

## 2023-02-20 NOTE — Progress Notes (Signed)
Marland Kitchen  [employer] Customer service manager [facility])                                            [facility] Quality Pharmacy Team    02/20/2023  [place] [employer] 07/30/1945 [id number]                                      Medication Assistance Referral  Referral From: [facility] Embedded RPh Vallery Sa   Medication/Company: Trudee Kuster / Julious Oka Patient application portion:  Mailed Provider application portion: Faxed  to Dr. Saralyn Pilar Provider address/fax verified via: Office website   Pattricia Boss, CPhT [place]  Office: [phone] Fax: [phone] Email: [email or web address]

## 2023-02-23 ENCOUNTER — Emergency Department
Admission: EM | Admit: 2023-02-23 | Discharge: 2023-02-23 | Disposition: A | Discharge status: Home / Self Care | Payer: Medicare Other | Attending: Emergency Medicine | Admitting: Emergency Medicine

## 2023-02-23 ENCOUNTER — Encounter: Payer: Self-pay | Admitting: Emergency Medicine

## 2023-02-23 ENCOUNTER — Emergency Department: Payer: Medicare Other

## 2023-02-23 ENCOUNTER — Other Ambulatory Visit: Payer: Self-pay

## 2023-02-23 DIAGNOSIS — K4020 Bilateral inguinal hernia, without obstruction or gangrene, not specified as recurrent: Secondary | ICD-10-CM | POA: Diagnosis not present

## 2023-02-23 DIAGNOSIS — R1030 Lower abdominal pain, unspecified: Secondary | ICD-10-CM

## 2023-02-23 DIAGNOSIS — K5792 Diverticulitis of intestine, part unspecified, without perforation or abscess without bleeding: Secondary | ICD-10-CM | POA: Diagnosis not present

## 2023-02-23 DIAGNOSIS — K5732 Diverticulitis of large intestine without perforation or abscess without bleeding: Secondary | ICD-10-CM | POA: Diagnosis not present

## 2023-02-23 DIAGNOSIS — K5730 Diverticulosis of large intestine without perforation or abscess without bleeding: Secondary | ICD-10-CM | POA: Diagnosis not present

## 2023-02-23 DIAGNOSIS — K449 Diaphragmatic hernia without obstruction or gangrene: Secondary | ICD-10-CM | POA: Diagnosis not present

## 2023-02-23 LAB — COMPREHENSIVE METABOLIC PANEL
ALT: 19 U/L (ref 0–44)
AST: 32 U/L (ref 15–41)
Albumin: 3.5 g/dL (ref 3.5–5.0)
Alkaline Phosphatase: 55 U/L (ref 38–126)
Anion gap: 13 (ref 5–15)
BUN: 31 mg/dL — ABNORMAL HIGH (ref 8–23)
CO2: 21 mmol/L — ABNORMAL LOW (ref 22–32)
Calcium: 8.7 mg/dL — ABNORMAL LOW (ref 8.9–10.3)
Chloride: 104 mmol/L (ref 98–111)
Creatinine, Ser: 1.32 mg/dL — ABNORMAL HIGH (ref 0.44–1.00)
GFR, Estimated: 42 mL/min — ABNORMAL LOW (ref >60)
Glucose, Bld: 131 mg/dL — ABNORMAL HIGH (ref 70–99)
Potassium: 4.2 mmol/L (ref 3.5–5.1)
Sodium: 138 mmol/L (ref 135–145)
Total Bilirubin: 1.2 mg/dL (ref 0.3–1.2)
Total Protein: 7.2 g/dL (ref 6.5–8.1)

## 2023-02-23 LAB — LIPASE, BLOOD: Lipase: 151 U/L — ABNORMAL HIGH (ref 11–51)

## 2023-02-23 LAB — CBC
HCT: 37 % (ref 36.0–46.0)
Hemoglobin: 10.4 g/dL — ABNORMAL LOW (ref 12.0–15.0)
MCH: 29.8 pg (ref 26.0–34.0)
MCHC: 28.1 g/dL — ABNORMAL LOW (ref 30.0–36.0)
MCV: 106 fL — ABNORMAL HIGH (ref 80.0–100.0)
Platelets: 151 10*3/uL (ref 150–400)
RBC: 3.49 MIL/uL — ABNORMAL LOW (ref 3.87–5.11)
RDW: 14.2 % (ref 11.5–15.5)
WBC: 4 10*3/uL (ref 4.0–10.5)
nRBC: 0 % (ref 0.0–0.2)

## 2023-02-23 LAB — URINALYSIS, ROUTINE W REFLEX MICROSCOPIC
Bilirubin Urine: NEGATIVE
Glucose, UA: >=500 mg/dL — AB
Hgb urine dipstick: NEGATIVE
Ketones, ur: NEGATIVE mg/dL
Leukocytes,Ua: NEGATIVE
Nitrite: NEGATIVE
Protein, ur: NEGATIVE mg/dL
Specific Gravity, Urine: 1.01 (ref 1.005–1.030)
pH: 5 (ref 5.0–8.0)

## 2023-02-23 MED ORDER — CIPROFLOXACIN HCL 500 MG PO TABS
500.0000 mg | ORAL_TABLET | Freq: Two times a day (BID) | ORAL | 0 refills | Status: AC
Start: 2023-02-23 — End: 2023-03-05

## 2023-02-23 MED ORDER — METRONIDAZOLE 500 MG PO TABS
500.0000 mg | ORAL_TABLET | Freq: Three times a day (TID) | ORAL | 0 refills | Status: AC
Start: 2023-02-23 — End: 2023-03-05

## 2023-02-23 MED ORDER — IOHEXOL 300 MG/ML  SOLN
80.0000 mL | Freq: Once | INTRAMUSCULAR | Status: AC | PRN
  Administered 2023-02-23: 80 mL via INTRAVENOUS

## 2023-02-23 NOTE — Discharge Instructions (Addendum)
Please seek medical attention for any high fevers, chest pain, shortness of breath, change in behavior, persistent vomiting, bloody stool or any other new or concerning symptoms.  

## 2023-02-23 NOTE — ED Triage Notes (Signed)
Pt to ED via POV with c/o lower abd pain that woke her up out of her sleep. Pt states pain is a 7/10, describes it as a constant pain. Pt states last urinated at approx 0200, denies burning or foul odor. Pt also denies nausea/diarrhea. Pt states the last time she had this abd pain she was also dx with a UTI.

## 2023-02-23 NOTE — ED Provider Notes (Signed)
[facility] Provider Note    Event Date/Time   First MD Initiated Contact with Patient 02/23/23 [phone]     (approximate)   History   Abdominal Pain   HPI  Diana Harmon is a 77 y.o. female who presents to the urgency department today with abdominal pain.  Pain is located in her lower abdomen.  Patient started having the pain this morning.  When it started was about an 8 out of 10 however by the time my exam it is down to 5 out of 10.  She describes it as burning.  She has had this pain in the past when she was diagnosed with a UTI.  She however denies any burning with urination or dysuria.  The patient denies any fevers.     Physical Exam   Triage Vital Signs: ED Triage Vitals  Encounter Vitals Group     BP 02/23/23 0355 (!) 153/66     Systolic BP Percentile --      Diastolic BP Percentile --      Pulse Rate 02/23/23 0355 75     Resp 02/23/23 0358 17     Temp 02/23/23 0355 98.2 F (36.8 C)     Temp Source 02/23/23 0355 Oral     SpO2 02/23/23 0358 98 %     Weight 02/23/23 0359 235 lb (106.6 kg)     Height 02/23/23 0359 5' (1.524 m)     Head Circumference --      Peak Flow --      Pain Score 02/23/23 0359 7     Pain Loc --      Pain Education --      Exclude from Growth Chart --     Most recent vital signs: Vitals:   02/23/23 0925 02/23/23 0927  BP:    Pulse: 67   Resp: 15   Temp:  (!) 97.5 F (36.4 C)  SpO2: 100%    General: Awake, alert, oriented. CV:  Good peripheral perfusion. Regular rate and rhythm. Resp:  Normal effort. Lungs clear. Abd:  No distention.    ED Results / Procedures / Treatments   Labs (all labs ordered are listed, but only abnormal results are displayed) Labs Reviewed  LIPASE, BLOOD - Abnormal; Notable for the following components:      Result Value   Lipase 151 (*)    All other components within normal limits  COMPREHENSIVE METABOLIC PANEL - Abnormal; Notable for the following components:   CO2  21 (*)    Glucose, Bld 131 (*)    BUN 31 (*)    Creatinine, Ser 1.32 (*)    Calcium 8.7 (*)    GFR, Estimated 42 (*)    All other components within normal limits  CBC - Abnormal; Notable for the following components:   RBC 3.49 (*)    Hemoglobin 10.4 (*)    MCV 106.0 (*)    MCHC 28.1 (*)    All other components within normal limits  URINALYSIS, ROUTINE W REFLEX MICROSCOPIC - Abnormal; Notable for the following components:   Color, Urine YELLOW (*)    APPearance CLEAR (*)    Glucose, UA >=500 (*)    Bacteria, UA RARE (*)    All other components within normal limits     EKG  None   RADIOLOGY I independently interpreted and visualized the CT abd/pel. My interpretation: No free air Radiology interpretation: IMPRESSION: 1. Colonic diverticulosis with a single diverticulum in the  proximal sigmoid colon which exhibits subtle surrounding fat stranding. In appropriate clinical setting findings may represent early/subacute diverticulitis. 2. Multiple other nonacute observations, as described above.    PROCEDURES:  Critical Care performed: No   MEDICATIONS ORDERED IN ED: Medications  iohexol (OMNIPAQUE) 300 MG/ML solution 80 mL (has no administration in time range)     IMPRESSION / MDM / ASSESSMENT AND PLAN / ED COURSE  I reviewed the triage vital signs and the nursing notes.                              Differential diagnosis includes, but is not limited to, UTI, appendicitis, diverticulitis, colitis  Patient's presentation is most consistent with acute presentation with potential threat to life or bodily function.   Patient presented to the emergency department today because of concerns for lower abdominal pain.  On exam patient is nontender.  UA was sent from triage did not show findings concerning for urinary tract infection.  At this time somewhat unclear etiology of the patient's pain.  Given that she is still having discomfort will check CT abdomen pelvis to  evaluate for any inflammation or infection.  Patient's blood work did show slight lipase elevation of unclear significance.  Patient is nontender in the epigastric region denies pain in that area.  Ct is concerning for possible early diverticulitis. At this time do think this could explain the patient's pain. Discussed with patient. Will plan on discharging with prescription for antibiotics.      FINAL CLINICAL IMPRESSION(S) / ED DIAGNOSES   Final diagnoses:  Lower abdominal pain  Diverticulitis      Note:  This document was prepared using Dragon voice recognition software and may include unintentional dictation errors.    Phineas Semen, MD 02/23/23 [phone]

## 2023-03-05 ENCOUNTER — Other Ambulatory Visit: Payer: Self-pay

## 2023-03-05 ENCOUNTER — Other Ambulatory Visit: Payer: Self-pay | Admitting: *Deleted

## 2023-03-05 NOTE — Patient Instructions (Signed)
Visit Information  Thank you for taking time to visit with me today. Please don't hesitate to contact me if I can be of assistance to you before our next scheduled telephone appointment.  Following are the goals we discussed today:   Goals Addressed             This Visit's Progress    RNCM Care Management  Expected Outcome:  Monitor, Self-Manage and Reduce Symptoms of Diabetes       Current Barriers:  Chronic Disease Management support and education needs related to effective management of DM Lab Results  Component Value Date   HGBA1C 7.0 (H) 10/31/2022     Planned Interventions: Provided education to patient about basic DM disease process. Review of goals of A1C. Recent UTI w/ notable elevated blood sugars, now treated and blood sugars are back to normal. Reviewed medications with patient and discussed importance of medication adherence. The patient is compliant with medications. Also works with pharm D for medications management. States that Trulicity is working well for her. She is happy that she does not have any side effects. Has been approved through 2024 to get Trulicity. Will consider Marcelline Deist or London Pepper but wants to talk to her cardiologist before agreeing to use either of these medications. Continues to work with the pharm D on a regular basis.;        Reviewed prescribed diet with patient heart healthy/ADA diet. Review of dietary constraints and the patient states it does get frustrating as she cannot eat the things she wants to eat but she is doing better than what she was with her eating.   The patient states that she is eating more salads now and drinking more water. Has seen big improvements in her dietary habits. Overall feels like she is well balanced with her dietary restrictions Counseled on importance of regular laboratory monitoring as prescribed. Has regular lab work. ;        Discussed plans with patient for ongoing care management follow up and provided patient with  direct contact information for care management team;      Provided patient with written educational materials related to hypo and hyperglycemia and importance of correct treatment. Denies any extreme highs or lows;       Reviewed scheduled/upcoming provider appointments including:  Sees pcp on a regular basis. Education and support given.  Advised patient, providing education and rationale, to check cbg when you have symptoms of low or high blood sugar and as directed   and record. The patient keeps a record of her blood sugars. Review of goal of fasting blood sugars <130 and post prandial of <180. Education provided.    02-23-2023 131  02-26-2023 97  02-27-2023 136  02-28-2023 131       call provider for findings outside established parameters;       Referral made to pharmacy team for assistance with ongoing support and education for effective management of medications. The patient works with the pharm D on a regular basis for effective management of medications. ;       Review of patient status, including review of consultants reports, relevant laboratory and other test results, and medications completed;       Advised patient to discuss changes or concerns about DM and DM health and well being with provider;      Screening for signs and symptoms of depression related to chronic disease state;        Assessed social determinant of health barriers;  Symptom Management: Take medications as prescribed   Attend all scheduled provider appointments Call provider office for new concerns or questions  call the Suicide and Crisis Lifeline: 988 call the [place] National Suicide Prevention Lifeline: 807-487-6311 or TTY: 260-789-9306 TTY 225-064-6996) to talk to a trained counselor call 1-800-273-TALK (toll free, 24 hour hotline) if experiencing a Mental Health or Behavioral Health Crisis  schedule appointment with eye doctor check feet daily for cuts, sores or redness trim toenails straight  across wash and dry feet carefully every day wear comfortable, cotton socks wear comfortable, well-fitting shoes  Follow Up Plan: Telephone follow up appointment with care management team member scheduled for: 04-09-2023 at 1 pm       [facility] Care Management Expected Outcome:  Monitor, Self-Manage and Reduce Symptoms of Heart Failure       Current Barriers:  Knowledge Deficits related to changes in fluid balance with CHF  Chronic Disease Management support and education needs related to effective management of CHF EF is 60 to 65% on 11-08-2020, new Echo scheduled for 08-07-2022  Wt Readings from Last 3 Encounters:  02/23/23 235 lb (106.6 kg)  01/10/23 238 lb 1.6 oz (108 kg)  01/02/23 240 lb (108.9 kg)  Self reported weight today 11-18-2022 is 240    BP Readings from Last 3 Encounters:  02/23/23 (!) 141/58  01/10/23 137/65  01/02/23 (!) 156/80    Planned Interventions: Basic overview and discussion of pathophysiology of Heart Failure reviewed. Review. The patient feels her HF is stable at this time. The patient denies any swelling or edema today.  Provided education on low sodium diet. Review of Heart healthy/ADA diet. Education to monitor for changes in water weight versus actual weight. The patient denies any acute changes or shifts in her weight. Education provided. States her weight is 236 to 238 consistently. 02-26-2023 236  02-27-2023 233  02-28-2023 233   Reviewed Heart Failure Action Plan in depth  Assessed need for readable accurate scales in home. The patient has scales and can safely weigh Provided education about placing scale on hard, flat surface Advised patient to weigh each morning after emptying bladder Discussed importance of daily weight and advised patient to weigh and record daily. The patient feels her swelling and edema has leveled out. She denies any acute water shifts. Is monitoring closely. States she is taking her medications as ordered. Reviewed role of  diuretics in prevention of fluid overload and management of heart failure. Is taking Lasix daily currently. Knows when she does not have swelling in her ankles to take only as needed. States she has been taking her Lasix daily the last few days due to swelling. Has had changes in the dosage recently and this is working well for her. Education on monitoring for dehydration. Discussed the importance of keeping all appointments with provider.  The patient knows to call for changes or new needs. Provided patient with education about the role of exercise in the management of heart failure Advised patient to discuss changes in water weight, questions or concerns about HF or heart health with provider Screening for signs and symptoms of depression related to chronic disease state  Assessed social determinant of health barriers   Symptom Management: Take medications as prescribed   Attend all scheduled provider appointments Call pharmacy for medication refills 3-7 days in advance of running out of medications Call provider office for new concerns or questions  call the Suicide and Crisis Lifeline: 988 call the [place] National Suicide Prevention Lifeline:  573-826-1394 or TTY: 6264123683 TTY 843-289-2308) to talk to a trained counselor call 1-800-273-TALK (toll free, 24 hour hotline) if experiencing a Mental Health or Behavioral Health Crisis  call office if I gain more than 2 pounds in one day or 5 pounds in one week keep legs up while sitting track weight in diary use salt in moderation watch for swelling in feet, ankles and legs every day weigh myself daily develop a rescue plan follow rescue plan if symptoms flare-up track symptoms and what helps feel better or worse dress right for the weather, hot or cold  Follow Up Plan: Telephone follow up appointment with care management team member scheduled for: 04-09-2023 at 1 pm       RNCM Care Management Expected Outcome:  Monitor, Self-Manage,  and Reduce Symptoms of Hypertension       Current Barriers:  Chronic Disease Management support and education needs related to effective management of HTN  BP Readings from Last 3 Encounters:  02/23/23 (!) 141/58  01/10/23 137/65  01/02/23 (!) 156/80    Planned Interventions: Evaluation of current treatment plan related to hypertension self management and patient's adherence to plan as established by provider. The patient states her blood pressures have been stable.  Provided education to patient re: stroke prevention, s/s of heart attack and stroke; Reviewed prescribed diet heart healthy/ADA diet. Review and discussed monitoring for hidden sodium and sugars in food and how to effectively balance food intake   Reviewed medications with patient and discussed importance of compliance. Is compliant with medications. Works with the pharm D on a regular basis for effective management of medications;  Discussed plans with patient for ongoing care management follow up and provided patient with direct contact information for care management team; Advised patient, providing education and rationale, to monitor blood pressure daily and record, calling PCP for findings outside established parameters. The patient states that her blood pressures have been good at home. The patient does have know "white coat syndrome" 02-26-2023 139/65 HR 90  02-27-2023 132/65   02-28-2023 137/64 HR 80  03-05-2023 139/66 HR 82   Reviewed scheduled/upcoming provider appointments including: Sees specialist on a regular basis. Knows to call for new needs or changes.  Advised patient to discuss changes in blood pressures and heart health with provider; Provided education on prescribed diet heart healthy/ADA diet. Education and review. Will send email information for healthy planning for meals ;  Discussed complications of poorly controlled blood pressure such as heart disease, stroke, circulatory complications, vision  complications, kidney impairment, sexual dysfunction;  Screening for signs and symptoms of depression related to chronic disease state;  Assessed social determinant of health barriers;  Was approved for Orencia for her RA and is happy about this. Takes an injection once a week and she can tell a positive difference in her RA pain. She is thankful for this and thankful for the support she gets from pharm D with cost constraints related to medications.   Symptom Management: Take medications as prescribed   Attend all scheduled provider appointments Call provider office for new concerns or questions  call the Suicide and Crisis Lifeline: 988 call the [place] National Suicide Prevention Lifeline: 856-700-6418 or TTY: 817-095-1742 TTY 440-246-3089) to talk to a trained counselor call 1-800-273-TALK (toll free, 24 hour hotline) if experiencing a Mental Health or Behavioral Health Crisis  check blood pressure 3 times per week choose a place to take my blood pressure (home, clinic or office, retail store) write blood pressure results  in a log or diary learn about high blood pressure keep a blood pressure log take blood pressure log to all doctor appointments call doctor for signs and symptoms of high blood pressure develop an action plan for high blood pressure keep all doctor appointments take medications for blood pressure exactly as prescribed report new symptoms to your doctor  Follow Up Plan: Telephone follow up appointment with care management team member scheduled for: 04-09-2023 at 1 pm           Our next appointment is by telephone on 04-09-2023 at 1:00 pm  Please call the care guide team at [phone] if you need to cancel or reschedule your appointment.   If you are experiencing a Mental Health or Behavioral Health Crisis or need someone to talk to, please call the Suicide and Crisis Lifeline: 988 call the [place] National Suicide Prevention Lifeline: (657)345-7683 or TTY:  662-782-5605 TTY 640-496-3968) to talk to a trained counselor call 1-800-273-TALK (toll free, 24 hour hotline) call 911   Patient verbalizes understanding of instructions and care plan provided today and agrees to view in MyChart. Active MyChart status and patient understanding of how to access instructions and care plan via MyChart confirmed with patient.     Telephone follow up appointment with care management team member scheduled for: 04-09-2023 at 1:00 pm  Danise Edge, BSN RN RN Care Manager  [facility]  Ambulatory Care Management  Direct Number: [phone]

## 2023-03-05 NOTE — Patient Outreach (Signed)
Care Management   Visit Note  03/05/2023 Name: Diana Harmon MRN: [id number] DOB: [birth year]  Subjective: Diana Harmon is a 77 y.o. year old female who is a primary care patient of Smitty Cords, DO. The Care Management team was consulted for assistance.      Engaged with patient spoke with patient by telephone.    Goals Addressed             This Visit's Progress    RNCM Care Management  Expected Outcome:  Monitor, Self-Manage and Reduce Symptoms of Diabetes       Current Barriers:  Chronic Disease Management support and education needs related to effective management of DM Lab Results  Component Value Date   HGBA1C 7.0 (H) 10/31/2022     Planned Interventions: Provided education to patient about basic DM disease process. Review of goals of A1C. Recent UTI w/ notable elevated blood sugars, now treated and blood sugars are back to normal. Reviewed medications with patient and discussed importance of medication adherence. The patient is compliant with medications. Also works with pharm D for medications management. States that Trulicity is working well for her. She is happy that she does not have any side effects. Has been approved through 2024 to get Trulicity. Will consider Marcelline Deist or London Pepper but wants to talk to her cardiologist before agreeing to use either of these medications. Continues to work with the pharm D on a regular basis.;        Reviewed prescribed diet with patient heart healthy/ADA diet. Review of dietary constraints and the patient states it does get frustrating as she cannot eat the things she wants to eat but she is doing better than what she was with her eating.   The patient states that she is eating more salads now and drinking more water. Has seen big improvements in her dietary habits. Overall feels like she is well balanced with her dietary restrictions Counseled on importance of regular laboratory monitoring as prescribed.  Has regular lab work. ;        Discussed plans with patient for ongoing care management follow up and provided patient with direct contact information for care management team;      Provided patient with written educational materials related to hypo and hyperglycemia and importance of correct treatment. Denies any extreme highs or lows;       Reviewed scheduled/upcoming provider appointments including:  Sees pcp on a regular basis. Education and support given.  Advised patient, providing education and rationale, to check cbg when you have symptoms of low or high blood sugar and as directed   and record. The patient keeps a record of her blood sugars. Review of goal of fasting blood sugars <130 and post prandial of <180. Education provided.    02-23-2023 131  02-26-2023 97  02-27-2023 136  02-28-2023 131       call provider for findings outside established parameters;       Referral made to pharmacy team for assistance with ongoing support and education for effective management of medications. The patient works with the pharm D on a regular basis for effective management of medications. ;       Review of patient status, including review of consultants reports, relevant laboratory and other test results, and medications completed;       Advised patient to discuss changes or concerns about DM and DM health and well being with provider;      Screening for signs and symptoms  of depression related to chronic disease state;        Assessed social determinant of health barriers;         Symptom Management: Take medications as prescribed   Attend all scheduled provider appointments Call provider office for new concerns or questions  call the Suicide and Crisis Lifeline: 988 call the [place] National Suicide Prevention Lifeline: (418)560-6104 or TTY: 303 065 2140 TTY [phone]) to talk to a trained counselor call 1-800-273-TALK (toll free, 24 hour hotline) if experiencing a Mental Health or  Behavioral Health Crisis  schedule appointment with eye doctor check feet daily for cuts, sores or redness trim toenails straight across wash and dry feet carefully every day wear comfortable, cotton socks wear comfortable, well-fitting shoes  Follow Up Plan: Telephone follow up appointment with care management team member scheduled for: 04-09-2023 at 1 pm       [facility] Care Management Expected Outcome:  Monitor, Self-Manage and Reduce Symptoms of Heart Failure       Current Barriers:  Knowledge Deficits related to changes in fluid balance with CHF  Chronic Disease Management support and education needs related to effective management of CHF EF is 60 to 65% on 11-08-2020, new Echo scheduled for 08-07-2022  Wt Readings from Last 3 Encounters:  02/23/23 235 lb (106.6 kg)  01/10/23 238 lb 1.6 oz (108 kg)  01/02/23 240 lb (108.9 kg)  Self reported weight today 11-18-2022 is 240    BP Readings from Last 3 Encounters:  02/23/23 (!) 141/58  01/10/23 137/65  01/02/23 (!) 156/80    Planned Interventions: Basic overview and discussion of pathophysiology of Heart Failure reviewed. Review. The patient feels her HF is stable at this time. The patient denies any swelling or edema today.  Provided education on low sodium diet. Review of Heart healthy/ADA diet. Education to monitor for changes in water weight versus actual weight. The patient denies any acute changes or shifts in her weight. Education provided. States her weight is 236 to 238 consistently. 02-26-2023 236  02-27-2023 233  02-28-2023 233   Reviewed Heart Failure Action Plan in depth  Assessed need for readable accurate scales in home. The patient has scales and can safely weigh Provided education about placing scale on hard, flat surface Advised patient to weigh each morning after emptying bladder Discussed importance of daily weight and advised patient to weigh and record daily. The patient feels her swelling and edema has leveled out.  She denies any acute water shifts. Is monitoring closely. States she is taking her medications as ordered. Reviewed role of diuretics in prevention of fluid overload and management of heart failure. Is taking Lasix daily currently. Knows when she does not have swelling in her ankles to take only as needed. States she has been taking her Lasix daily the last few days due to swelling. Has had changes in the dosage recently and this is working well for her. Education on monitoring for dehydration. Discussed the importance of keeping all appointments with provider.  The patient knows to call for changes or new needs. Provided patient with education about the role of exercise in the management of heart failure Advised patient to discuss changes in water weight, questions or concerns about HF or heart health with provider Screening for signs and symptoms of depression related to chronic disease state  Assessed social determinant of health barriers   Symptom Management: Take medications as prescribed   Attend all scheduled provider appointments Call pharmacy for medication refills 3-7 days in advance  of running out of medications Call provider office for new concerns or questions  call the Suicide and Crisis Lifeline: 988 call the [place] National Suicide Prevention Lifeline: 484-313-3981 or TTY: (365) 013-2802 TTY 404-823-8193) to talk to a trained counselor call 1-800-273-TALK (toll free, 24 hour hotline) if experiencing a Mental Health or Behavioral Health Crisis  call office if I gain more than 2 pounds in one day or 5 pounds in one week keep legs up while sitting track weight in diary use salt in moderation watch for swelling in feet, ankles and legs every day weigh myself daily develop a rescue plan follow rescue plan if symptoms flare-up track symptoms and what helps feel better or worse dress right for the weather, hot or cold  Follow Up Plan: Telephone follow up appointment with care  management team member scheduled for: 04-09-2023 at 1 pm       RNCM Care Management Expected Outcome:  Monitor, Self-Manage, and Reduce Symptoms of Hypertension       Current Barriers:  Chronic Disease Management support and education needs related to effective management of HTN  BP Readings from Last 3 Encounters:  02/23/23 (!) 141/58  01/10/23 137/65  01/02/23 (!) 156/80    Planned Interventions: Evaluation of current treatment plan related to hypertension self management and patient's adherence to plan as established by provider. The patient states her blood pressures have been stable.  Provided education to patient re: stroke prevention, s/s of heart attack and stroke; Reviewed prescribed diet heart healthy/ADA diet. Review and discussed monitoring for hidden sodium and sugars in food and how to effectively balance food intake   Reviewed medications with patient and discussed importance of compliance. Is compliant with medications. Works with the pharm D on a regular basis for effective management of medications;  Discussed plans with patient for ongoing care management follow up and provided patient with direct contact information for care management team; Advised patient, providing education and rationale, to monitor blood pressure daily and record, calling PCP for findings outside established parameters. The patient states that her blood pressures have been good at home. The patient does have know "white coat syndrome" 02-26-2023 139/65 HR 90  02-27-2023 132/65   02-28-2023 137/64 HR 80  03-05-2023 139/66 HR 82   Reviewed scheduled/upcoming provider appointments including: Sees specialist on a regular basis. Knows to call for new needs or changes.  Advised patient to discuss changes in blood pressures and heart health with provider; Provided education on prescribed diet heart healthy/ADA diet. Education and review. Will send email information for healthy planning for meals ;   Discussed complications of poorly controlled blood pressure such as heart disease, stroke, circulatory complications, vision complications, kidney impairment, sexual dysfunction;  Screening for signs and symptoms of depression related to chronic disease state;  Assessed social determinant of health barriers;  Was approved for Orencia for her RA and is happy about this. Takes an injection once a week and she can tell a positive difference in her RA pain. She is thankful for this and thankful for the support she gets from pharm D with cost constraints related to medications.   Symptom Management: Take medications as prescribed   Attend all scheduled provider appointments Call provider office for new concerns or questions  call the Suicide and Crisis Lifeline: 988 call the [place] National Suicide Prevention Lifeline: 340-075-4828 or TTY: 443-381-9691 TTY 435 864 6831) to talk to a trained counselor call 1-800-273-TALK (toll free, 24 hour hotline) if experiencing a Mental Health or Behavioral  Health Crisis  check blood pressure 3 times per week choose a place to take my blood pressure (home, clinic or office, retail store) write blood pressure results in a log or diary learn about high blood pressure keep a blood pressure log take blood pressure log to all doctor appointments call doctor for signs and symptoms of high blood pressure develop an action plan for high blood pressure keep all doctor appointments take medications for blood pressure exactly as prescribed report new symptoms to your doctor  Follow Up Plan: Telephone follow up appointment with care management team member scheduled for: 04-09-2023 at 1 pm              Consent to Services:  Patient was given information about care management services, agreed to services, and gave verbal consent to participate.   Plan: Telephone follow up appointment with care management team member scheduled for: 04-09-2023 at 1:00 pm  Danise Edge, BSN RN RN Care Manager  [facility]  Ambulatory Care Management  Direct Number: [phone]

## 2023-03-13 ENCOUNTER — Telehealth: Payer: Self-pay | Admitting: Pharmacy Technician

## 2023-03-13 DIAGNOSIS — Z5986 Financial insecurity: Secondary | ICD-10-CM

## 2023-03-13 NOTE — Progress Notes (Signed)
[facility])                                            [facility] Quality Pharmacy Team    03/13/2023  Diana Harmon Jul 31, 1945 [id number]  Received both patient and provider portion(s) of patient assistance application(s) for Trulicity. Faxed completed application and required documents into [employer].   Pattricia Boss, CPhT [place]  Office: [phone] Fax: [phone] Email: [email or web address]

## 2023-03-14 ENCOUNTER — Other Ambulatory Visit: Payer: Medicare Other | Admitting: Pharmacist

## 2023-03-14 NOTE — Patient Instructions (Signed)
Goals Addressed             This Visit's Progress    Pharmacy Goals       Our goal A1c is less than 7%. This corresponds with fasting sugars less than 130 and 2 hour after meal sugars less than 180. Please keep a log of your results when checking your blood sugar  Our goal bad cholesterol, or LDL, is less than 70 . This is why it is important to continue taking your simvastatin  Please check your home blood pressure, keep a log of the results and bring this with you to your medical appointments.  Feel free to call me with any questions or concerns. I look forward to our next call!   Wallace Cullens, PharmD, Para March, CPP Clinical Pharmacist [facility] [phone]

## 2023-03-14 NOTE — Progress Notes (Signed)
03/14/2023 Name: Diana Harmon MRN: [id number] DOB: 01/19/1946  Chief Complaint  Patient presents with   Medication Assistance   Medication Management    Diana Harmon is a 77 y.o. year old female who presented for a telephone visit.   They were referred to the pharmacist by their PCP for assistance in managing diabetes, hypertension, hyperlipidemia, and medication access.      Subjective:   Care Team: Primary Care Provider: Smitty Cords, DO  Rheumatologist: Tracey Harries, MD; Next Scheduled Visit: 04/17/2023 Hematologist: Earna Coder, MD; Next Scheduled Visit: 07/10/2023 Nurse Care Manager: Carma Lair, RN; Next Schedule Visit: 04/09/2023 Cardiologist: Yvonne Kendall, MD; Next Scheduled Visit: 06/25/2023  Medication Access/Adherence  Current Pharmacy:  [facility] [address] ([address]), [place] - [address] [place]) [place] [address] Phone: [phone] Fax: [phone]  [facility] [address], [place] - [address] [place] [address] Phone: [phone] Fax: [phone]  [email or web address] - [place], [place] - [phone] [facility] [address] [place] [address] Phone: [phone] Fax: [phone]  [facility] - [place], [place] - [address] [place] [address] Phone: [phone] Fax: [phone]  [id number], [place] - [address] AT [facility] [address] [place] [place] [address] Phone: [phone] Fax: [phone]   Patient reports affordability concerns with their medications: No Patient reports access/transportation concerns to their pharmacy: No  Patient reports adherence concerns with their medications:  No     From review of chart, note patient seen in [facility] ED on 10/20 related to lower abdominal pain. Provider  advised patient to start: Ciprofloxacin 500 mg twice daily x 10 days Metronidazole 500 mg three times daily x 10 days  Today reports completed courses of both antibiotics and abdominal symptoms resolved   Note patient receiving her Orencia through [employer] patient assistance. Received notice from program reminding her that her enrollment ends 05/06/2023    Diabetes:   Current medications:  - Trulicity 0.75 mg weekly  - Farxiga 10 mg daily (reports started ~1 month ago)   Medications tried in the past: metformin   Reports recent blood sugar readings ranging 84-100   Patient denies hypoglycemic s/sx including dizziness, shakiness, sweating.    Current physical activity:  Reports exercise limited by arthritis - walks using walker consistently Doing chair exercises from PT/OT and stretching at home again and walking more   Statin therapy: simvastatin 20 mg daily  Current medication access support:  - Enrolled in patient assistance for Trulicity through [employer] through 05/06/2023  Reports has enough of Trulicity to last through end of calendar year From review of chart, note  - Enrolled in patient assistance for Farxiga through [employer] through 05/05/2024      HTN/Chronic HFpEF :   Current medications:  ACEi/ARB/ARNI: losartan 50 mg daily SGLT2 inhibitor: Farxiga 10 mg daily (reports started ~1 month ago) Diuretic regimen: furosemide 40 mg - 1/2 (20 mg) daily as needed - typically taking ~2 days/week HCTZ 25 mg daily   Last checked blood pressure today, reading:  - 11/2: 126/60, HR 75 - 11/4: 125/72, HR 74 - 11/6: 132/64, HR 82 - 11/7: 121/60, HR 73 - Today: 122/63, HR 80  Reports monitoring home weight    Reports leg swelling controlled. Reports elevating legs during the day and wearing compression stockings  helps   Current physical activity:  Reports exercise limited by arthritis - walks using walker consistently Doing chair exercises from PT/OT and stretching at home again  and walking more   Current medication access support:  - Enrolled in patient assistance for Farxiga through [employer] through 05/05/2024     Objective:  Lab Results  Component Value Date   HGBA1C 7.0 (H) 10/31/2022    Lab Results  Component Value Date   CREATININE 1.32 (H) 02/23/2023   BUN 31 (H) 02/23/2023   NA 138 02/23/2023   K 4.2 02/23/2023   CL 104 02/23/2023   CO2 21 (L) 02/23/2023    Lab Results  Component Value Date   CHOL 159 04/24/2022   HDL 76 04/24/2022   LDLCALC 67 04/24/2022   TRIG 77 04/24/2022   CHOLHDL 2.1 04/24/2022   BP Readings from Last 3 Encounters:  02/23/23 (!) 141/58  01/10/23 137/65  01/02/23 (!) 156/80   Pulse Readings from Last 3 Encounters:  02/23/23 95  01/10/23 74  01/02/23 82    Medications Reviewed Today     Reviewed by Manuela Neptune, RPH-CPP (Pharmacist) on 03/14/23 at 1001  Med List Status: <None>   Medication Order Taking? Sig Documenting Provider Last Dose Status Informant  ACCU-CHEK GUIDE test strip [id number]  USE TO CHECK BLOOD SUGAR TWICE DAILY Smitty Cords, DO  Active   Accu-Chek Softclix Lancets lancets [id number]  USE TO CHECK BLOOD GLUCOSE TWICE DAILY Althea Charon, Netta Neat, DO  Active   acetaminophen (TYLENOL) 500 MG tablet [id number]  Take 1,000 mg by mouth every 6 (six) hours as needed for moderate pain.  [provider]  Active Child  Artificial Tear Solution (GENTEAL TEARS) 0.1-0.2-0.3 % SOLN [id number]  Place 1 drop into both eyes 3 (three) times daily as needed (dry/irritated eyes.). [provider]  Active Child  aspirin 81 MG chewable tablet [id number]  Chew 1 tablet (81 mg total) by mouth daily. Allena Katz  Active Child  B COMPLEX-C PO [id number]  Take 10 mLs by mouth daily. [provider]  Active Child  baclofen (LIORESAL) 10 MG tablet [id number]  TAKE 1 TO 2 TABLETS BY MOUTH TWICE DAILY AS NEEDED FOR MUSCLE SPASM [name], Netta Neat, DO  Active    benzonatate (TESSALON) 100 MG capsule [id number]  Take 1 capsule (100 mg total) by mouth 3 (three) times daily as needed for cough.  Patient not taking: Reported on 02/10/2023   Smitty Cords, DO  Active   Blood Glucose Monitoring Suppl (ACCU-CHEK GUIDE) w/Device KIT [id number]  Use to check blood sugar twice per day Smitty Cords, DO  Active   Cholecalciferol (VITAMIN D3) 125 MCG (5000 UT) TABS [id number]  Take 5,000 Units by mouth daily. [provider]  Active Child    Discontinued 09/05/19 1316   cyanocobalamin 1000 MCG tablet [id number]  Take by mouth every other day. [provider]  Active            Med Note Ronney Asters, [facility]   Mon Jan 13, 2023 10:15 AM)    dapagliflozin propanediol (FARXIGA) 10 MG TABS tablet [id number] Yes Take 10 mg by mouth daily. [provider] Taking Active   Ferrous Sulfate (IRON) 325 (65 Fe) MG TABS [id number]  TAKE 1 TABLET BY MOUTH TWICE DAILY WITH A MEAL [name], Netta Neat, DO  Active Child  furosemide (LASIX) 40 MG tablet [id number] Yes Take 1 tablet (40 mg total) by mouth daily.  Patient  taking differently: Take 20 mg by mouth daily as needed.   Smitty Cords, DO Taking Active   hydrochlorothiazide (HYDRODIURIL) 25 MG tablet [id number] Yes Take 1 tablet by mouth once daily Lanier Prude, MD Taking Active   hydroxychloroquine (PLAQUENIL) 200 MG tablet [id number]  Take 1 tablet by mouth 2 (two) times daily. [provider]  Active   lactobacillus acidophilus (BACID) TABS tablet [id number]  Take 1 tablet by mouth daily as needed (digestive health.). [provider]  Active            Med Note Alphonzo Dublin   Wed Nov 03, 2019  3:06 PM)    Lancets Misc. KIT [id number]  Use to check blood sugar twice daily [provider]  Active   levocetirizine (XYZAL) 5 MG tablet [id number]  TAKE 1 TABLET BY MOUTH ONCE DAILY IN THE EVENING  Patient taking differently: Take 1/2 tablet  (2.5 mg) every other day in the evening as needed for allergies   Smitty Cords, DO  Active   losartan (COZAAR) 50 MG tablet [id number] Yes Take 1 tablet by mouth once daily Lanier Prude, MD Taking Active   magnesium oxide (MAG-OX) 400 MG tablet [id number]  Take 400 mg by mouth daily. [provider]  Active   mupirocin ointment (BACTROBAN) 2 % [id number]  Apply 1 Application topically 2 (two) times daily.  Patient taking differently: Apply 1 Application topically 2 (two) times daily as needed.   Smitty Cords, DO  Active   ORENCIA CLICKJECT 125 MG/ML Ivory Broad [id number]  Inject into the skin. [provider]  Active   simvastatin (ZOCOR) 20 MG tablet [id number]  TAKE 1 TABLET BY MOUTH ONCE DAILY AT  [address] DO  Active   TRULICITY 0.75 MG/0.5ML SOPN [id number] Yes Inject 0.75 mg as directed once a week. Smitty Cords, DO Taking Active               Assessment/Plan:   Review with patient/daughter  requirements for the Orencia patient assistance program through [employer] ([employer]). Note for Medicare patients, BMS requires patient to meet 3% annual out of pocket expense requirement before would be eligible From review of Rheumatoid Arthritis (RA) Foundation funds, note PAN Foundation, Patient [employer] and [employer] funds are all currently closed Again encourage patient to sign up for notifications/waitlists for these funds Patient/daughter report that they will also follow up with [facility] about trying to reapply for patient assistance from BMS when possible as specialty pharmacy assisted with previous application  Encourage patient to contact office to schedule next follow up appointment with PCP in ~ 1 month  Diabetes: - Currently controlled - Reviewed long term cardiovascular and renal outcomes of uncontrolled blood sugar - Encouraged patient to have regular well-balanced  meals throughout the day, while controlling carbohydrate portion sizes - Encouraged patient to continue to use blood sugar checks as feedback on dietary choices - Encouraged patient to continue to monitor home blood sugar, keep log and bring record with her to medical appointments - Collaborating with PCP and CPhT to aid patient with applying for re-enrollment in Trulicity patient assistance program from [place]     HTN/Chronic HFpEF : - Reviewed appropriate blood pressure monitoring technique and reviewed goal blood pressure - Have reviewed to weigh daily and when to contact PCP/cardiology with weight gain     Follow Up Plan: Clinical Pharmacist will follow up with patient by telephone on  05/23/2023 at 9:15 AM     Estelle Grumbles, PharmD, [facility] [phone]

## 2023-03-17 ENCOUNTER — Other Ambulatory Visit: Payer: Self-pay | Admitting: Family Medicine

## 2023-03-17 ENCOUNTER — Ambulatory Visit (INDEPENDENT_AMBULATORY_CARE_PROVIDER_SITE_OTHER): Payer: Medicare Other | Admitting: Family Medicine

## 2023-03-17 VITALS — BP 124/60 | Ht 60.0 in | Wt 234.0 lb

## 2023-03-17 DIAGNOSIS — Z23 Encounter for immunization: Secondary | ICD-10-CM

## 2023-03-17 DIAGNOSIS — I10 Essential (primary) hypertension: Secondary | ICD-10-CM

## 2023-03-17 DIAGNOSIS — M0609 Rheumatoid arthritis without rheumatoid factor, multiple sites: Secondary | ICD-10-CM | POA: Diagnosis not present

## 2023-03-17 DIAGNOSIS — K5792 Diverticulitis of intestine, part unspecified, without perforation or abscess without bleeding: Secondary | ICD-10-CM

## 2023-03-17 DIAGNOSIS — E1169 Type 2 diabetes mellitus with other specified complication: Secondary | ICD-10-CM

## 2023-03-17 DIAGNOSIS — D649 Anemia, unspecified: Secondary | ICD-10-CM

## 2023-03-17 DIAGNOSIS — Z0000 Encounter for general adult medical examination without abnormal findings: Secondary | ICD-10-CM

## 2023-03-17 NOTE — Patient Instructions (Addendum)
Thank you for coming to the office today.  [employer] > Trulicity Gentry Fitz + Noreene Larsson)  [facility] [place] > Orencia med > [place] [facility] > Dr Allena Katz  Diverticulitis ED visit  We can follow up and treat this again in future if it happens again.  DUE for FASTING BLOOD WORK (no food or drink after midnight before the lab appointment, only water or coffee without cream/sugar on the morning of)  SCHEDULE "Lab Only" visit in the morning at the clinic for lab draw in 3 MONTHS   - Make sure Lab Only appointment is at about 1 week before your next appointment, so that results will be available  For Lab Results, once available within 2-3 days of blood draw, you can can log in to MyChart online to view your results and a brief explanation. Also, we can discuss results at next follow-up visit.   Please schedule a Follow-up Appointment to: Return if symptoms worsen or fail to improve, for 3 month fasting lab > 1 week later Annual Physical (prefer AM apt, possibly 06/17/23).  If you have any other questions or concerns, please feel free to call the office or send a message through MyChart. You may also schedule an earlier appointment if necessary.  Additionally, you may be receiving a survey about your experience at our office within a few days to 1 week by e-mail or mail. We value your feedback.  Saralyn Pilar, DO [facility], [place]

## 2023-03-17 NOTE — Progress Notes (Signed)
Subjective:    Patient ID: Diana Harmon, female    DOB: 10/19/1945, 77 y.o.   MRN: [id number]  Diana Harmon is a 77 y.o. female presenting on 03/17/2023 for Hospitalization Follow-up   HPI  Discussed the use of AI scribe software for clinical note transcription with the patient, who gave verbal consent to proceed.  ED FOLLOW-UP VISIT  Hospital/Location: [facility] Date of ED Visit: 02/23/23  Reason for Presenting to ED: Lower Abdominal Pain  FOLLOW-UP  - ED provider note and record have been reviewed - Patient presents today about 22 days after recent ED visit. Brief summary of recent course, patient had symptoms of of lower abdominal pain and upset stomach, she initially woke up 2am initially then woke up daughter at 3am. Arrived at ED in early AM. She did not have blood in stool or dysuria. She had blood and urine panel. Showed various results. See below. And had CT imaging. Dx with Diverticulitis.   - Today reports overall has done well after discharge from ED. Symptoms of abdominal pain, loose stool - have resolved.   - New medications on discharge: Cipro + Flagyl antibiotics - Changes to current meds on discharge: None  The patient also reported a history of eczema and described her skin as being dry and itchy, which she attributed to inadequate water intake. She reported trying to increase her water consumption to alleviate these symptoms. The patient also has a history of taking probiotics daily for gut health.  Also she has questions on her Patient Assistance Forms today with [employer] for Trulicity vs BMS for Orencia through Rheumatology Dr Allena Katz. She will take that form to their office, not ours.  I have reviewed the discharge medication list, and have reconciled the current and discharge medications today.  HM Need Flu Vaccine      03/17/2023    1:21 PM 11/06/2022    8:58 AM 05/24/2022   10:32 AM  Depression screen PHQ 2/9  Decreased Interest 0 0 0   Down, Depressed, Hopeless 0 0 0  PHQ - 2 Score 0 0 0  Altered sleeping  0 0  Tired, decreased energy  0 0  Change in appetite  0 0  Feeling bad or failure about yourself   0 0  Trouble concentrating  0 0  Moving slowly or fidgety/restless  0 0  Suicidal thoughts  0 0  PHQ-9 Score  0 0  Difficult doing work/chores  Not difficult at all Not difficult at all    Social History   Tobacco Use   Smoking status: Former    Current packs/day: 0.00    Types: Cigarettes    Start date: 09/05/1959    Quit date: 09/05/1979    Years since quitting: 43.5   Smokeless tobacco: Former  Building services engineer status: Never Used  Substance Use Topics   Alcohol use: Not Currently   Drug use: No    Review of Systems Per HPI unless specifically indicated above     Objective:    BP 124/60   Ht 5' (1.524 m)   Wt 234 lb (106.1 kg)   BMI 45.70 kg/m   Wt Readings from Last 3 Encounters:  03/17/23 234 lb (106.1 kg)  02/23/23 235 lb (106.6 kg)  01/10/23 238 lb 1.6 oz (108 kg)    Physical Exam Vitals and nursing note reviewed.  Constitutional:      General: She is not in acute distress.    Appearance:  Normal appearance. She is well-developed. She is not diaphoretic.     Comments: Well-appearing, comfortable, cooperative  HENT:     Head: Normocephalic and atraumatic.  Eyes:     General:        Right eye: No discharge.        Left eye: No discharge.     Conjunctiva/sclera: Conjunctivae normal.  Cardiovascular:     Rate and Rhythm: Normal rate.  Pulmonary:     Effort: Pulmonary effort is normal.  Skin:    General: Skin is warm and dry.     Findings: No erythema or rash.  Neurological:     Mental Status: She is alert and oriented to person, place, and time.  Psychiatric:        Mood and Affect: Mood normal.        Behavior: Behavior normal.        Thought Content: Thought content normal.     Comments: Well groomed, good eye contact, normal speech and thoughts     I have personally  reviewed the radiology report from 02/23/23 on CT Abd Pelvis.  CLINICAL DATA:  lower abd pain.   EXAM: CT ABDOMEN AND PELVIS WITH CONTRAST   TECHNIQUE: Multidetector CT imaging of the abdomen and pelvis was performed using the standard protocol following bolus administration of intravenous contrast.   RADIATION DOSE REDUCTION: This exam was performed according to the departmental dose-optimization program which includes automated exposure control, adjustment of the mA and/or kV according to patient size and/or use of iterative reconstruction technique.   CONTRAST:  80mL OMNIPAQUE IOHEXOL 300 MG/ML  SOLN   COMPARISON:  CT scan angiography chest, abdomen and pelvis from 11/11/2019.   FINDINGS: Lower chest: There are patchy atelectatic changes in the visualized lung bases. No overt consolidation. No pleural effusion. The heart is normal in size. No pericardial effusion. Prosthetic aortic valve noted.   Hepatobiliary: The liver is normal in size. Non-cirrhotic configuration. No suspicious mass. No intrahepatic or extrahepatic bile duct dilation. No calcified gallstones. Normal gallbladder wall thickness. No pericholecystic inflammatory changes.   Pancreas: Unremarkable. No pancreatic ductal dilatation or surrounding inflammatory changes.   Spleen: Within normal limits. No focal lesion.   Adrenals/Urinary Tract: Adrenal glands are unremarkable. No suspicious renal mass. No hydronephrosis. No renal or ureteric calculi. Unremarkable urinary bladder.   Stomach/Bowel: There is a small sliding hiatal hernia. No disproportionate dilation of the small or large bowel loops. No evidence of abnormal bowel wall thickening or inflammatory changes. The appendix is unremarkable. There are multiple diverticula mainly in the sigmoid colon. There is a single diverticulum in the proximal sigmoid colon (series 5, images 62-63) which exhibits subtle surrounding fat stranding. In appropriate  clinical setting findings may represent early/subacute diverticulitis.   Vascular/Lymphatic: No ascites or pneumoperitoneum. No abdominal or pelvic lymphadenopathy, by size criteria. No aneurysmal dilation of the major abdominal arteries. There are mild peripheral atherosclerotic vascular calcifications of the aorta and its major branches. There are few venous collaterals in the left lower para-aortic region, which are similar to the prior study.   Reproductive: The uterus is unremarkable. No large adnexal mass.   Other: There are fat containing umbilical and bilateral inguinal hernias. The soft tissues and abdominal wall are otherwise unremarkable.   Musculoskeletal: No suspicious osseous lesions. There are mild multilevel degenerative changes in the visualized spine.   IMPRESSION: 1. Colonic diverticulosis with a single diverticulum in the proximal sigmoid colon which exhibits subtle surrounding fat stranding. In appropriate clinical  setting findings may represent early/subacute diverticulitis. 2. Multiple other nonacute observations, as described above.   Aortic Atherosclerosis (ICD10-I70.0).     Electronically Signed   By: Jules Schick M.D.   On: 02/23/2023 11:14  Results for orders placed or performed during the hospital encounter of 02/23/23  Lipase, blood  Result Value Ref Range   Lipase 151 (H) 11 - 51 U/L  Comprehensive metabolic panel  Result Value Ref Range   Sodium 138 135 - 145 mmol/L   Potassium 4.2 3.5 - 5.1 mmol/L   Chloride 104 98 - 111 mmol/L   CO2 21 (L) 22 - 32 mmol/L   Glucose, Bld 131 (H) 70 - 99 mg/dL   BUN 31 (H) 8 - 23 mg/dL   Creatinine, Ser 1.32 (H) 0.44 - 1.00 mg/dL   Calcium 8.7 (L) 8.9 - 10.3 mg/dL   Total Protein 7.2 6.5 - 8.1 g/dL   Albumin 3.5 3.5 - 5.0 g/dL   AST 32 15 - 41 U/L   ALT 19 0 - 44 U/L   Alkaline Phosphatase 55 38 - 126 U/L   Total Bilirubin 1.2 0.3 - 1.2 mg/dL   GFR, Estimated 42 (L) >60 mL/min   Anion gap 13 5 -  15  CBC  Result Value Ref Range   WBC 4.0 4.0 - 10.5 K/uL   RBC 3.49 (L) 3.87 - 5.11 MIL/uL   Hemoglobin 10.4 (L) 12.0 - 15.0 g/dL   HCT 44.0 10.2 - 72.5 %   MCV 106.0 (H) 80.0 - 100.0 fL   MCH 29.8 26.0 - 34.0 pg   MCHC 28.1 (L) 30.0 - 36.0 g/dL   RDW 36.6 44.0 - 34.7 %   Platelets 151 150 - 400 K/uL   nRBC 0.0 0.0 - 0.2 %  Urinalysis, Routine w reflex microscopic -Urine, Clean Catch  Result Value Ref Range   Color, Urine YELLOW (A) YELLOW   APPearance CLEAR (A) CLEAR   Specific Gravity, Urine 1.010 1.005 - 1.030   pH 5.0 5.0 - 8.0   Glucose, UA >=500 (A) NEGATIVE mg/dL   Hgb urine dipstick NEGATIVE NEGATIVE   Bilirubin Urine NEGATIVE NEGATIVE   Ketones, ur NEGATIVE NEGATIVE mg/dL   Protein, ur NEGATIVE NEGATIVE mg/dL   Nitrite NEGATIVE NEGATIVE   Leukocytes,Ua NEGATIVE NEGATIVE   RBC / HPF 0-5 0 - 5 RBC/hpf   WBC, UA 6-10 0 - 5 WBC/hpf   Bacteria, UA RARE (A) NONE SEEN   Squamous Epithelial / HPF 0-5 0 - 5 /HPF   Mucus PRESENT       Assessment & Plan:   Problem List Items Addressed This Visit     Essential hypertension   Rheumatoid arthritis of multiple sites with negative rheumatoid factor (HCC)   Type 2 diabetes mellitus with other specified complication (HCC)   Other Visit Diagnoses     Diverticulitis    -  Primary   Needs flu shot       Relevant Orders   Flu Vaccine Trivalent High Dose (Fluad) (Completed)       Orders Placed This Encounter  Procedures   Flu Vaccine Trivalent High Dose (Fluad)    ED follow-up Diverticulitis Recent episode of lower abdominal pain diagnosed as diverticulitis in the ER on 02/23/2023. Treated with Ciprofloxacin and Metronidazole for 10 days. Symptoms resolved and bowel movements returned to normal after completion of antibiotics. -No further action required at this time. Monitor symptoms going forward if future flares can follow-up again  Dry Skin/Eczema  Reports dry, itchy skin. Patient is aware of the need for increased  hydration and has a history of eczema. -Encouraged to continue hydration efforts.  Medication Assistance Discussed the process for applying for medication assistance for Orencia through [facility] (Through her Rheumatology office, she can bring form to Dr Allena Katz) and our office is assistance with Trulicity through [employer]. -Ensure continuation of Trulicity through [employer]. PAP / pharmacy team  Vaccinations Discussed the need for flu shot, optional COVID-19 vaccine, and potential upgrade for pneumonia vaccine. Also discussed the availability of RSV vaccine for those 75 and older. -Administer flu shot today. -Consider pneumonia vaccine upgrade to Prevnar-20 in future and RSV vaccine at the pharmacy.  Follow-up Discussed scheduling a yearly checkup in the new year. -Schedule yearly checkup for February 2024.       No orders of the defined types were placed in this encounter.     Follow up plan: Return if symptoms worsen or fail to improve, for 3 month fasting lab > 1 week later Annual Physical (prefer AM apt, possibly 06/17/23).  Future labs ordered for 06/2023   Saralyn Pilar, DO [facility] 03/17/2023, 1:25 PM

## 2023-04-05 ENCOUNTER — Other Ambulatory Visit: Payer: Self-pay | Admitting: Cardiology

## 2023-04-05 DIAGNOSIS — I10 Essential (primary) hypertension: Secondary | ICD-10-CM

## 2023-04-08 ENCOUNTER — Other Ambulatory Visit: Payer: Self-pay | Admitting: Pharmacy Technician

## 2023-04-08 DIAGNOSIS — Z5986 Financial insecurity: Secondary | ICD-10-CM

## 2023-04-08 NOTE — Progress Notes (Signed)
Pharmacy Medication Assistance Program Note    04/08/2023  Patient ID: Diana Harmon, female   DOB: [birth year], 77 y.o.   MRN: [id number]     04/08/2023  Outreach [employer]  Initial Outreach Date ([employer]ication [employer]) 02/20/2023  Manufacturer [employer] Trulicity  Dose of Trulicity 0.75mg   Type of Radiographer, therapeutic Assistance  Date Application Sent to Patient 02/21/2023  Application Items Requested Application;Proof of Income;Other  Date Application Sent to Prescriber 02/20/2023  Name of Prescriber Saralyn Pilar  Date Application Received From Patient 03/12/2023  Application Items Received From Patient Application;Proof of Income;Other  Date Application Received From Provider 02/28/2023  Date Application Submitted to Manufacturer 03/12/2023  Method Application Sent to Manufacturer Fax  Patient Assistance Determination Approved  Approval Start Date 05/07/2023  Approval End Date 05/05/2024  Patient Notification Method Telephone Call  Telephone Call Outcome Successful  Additional Outreach Contact Provider  Contacted Provider Message      Care coordination call placed to [facility]. Spoke to [place] who informs patient is APPROVED 05/07/23-04/3124 for Trulicity. Next refill will process in January and Leanord Asal informs they will call patient around 2nd of January to confirm address to send the Trulicity to. She informs patient last got 4 pens on December 30, 2022. She informs subsequent refills will process automatically and be delivered to the patient's address on file. Patient may call [facility] at any time to check on next refill and shipment by dialing [phone].  Successful outreach to patient. HIPAA verified. Informed her of the above information and she verbalized understanding.  Pattricia Boss, CPhT [place]  Office: [phone] Fax: [phone] Email: [email or web address]

## 2023-04-09 ENCOUNTER — Other Ambulatory Visit: Payer: Self-pay

## 2023-04-09 NOTE — Patient Outreach (Signed)
Care Management   Visit Note  04/09/2023 Name: Diana Harmon MRN: [id number] DOB: 1945-12-02  Subjective: Diana Harmon is a 77 y.o. year old female who is a primary care patient of Smitty Cords, DO. The Care Management team was consulted for assistance.      Engaged with patient spoke with patient by telephone.    Goals Addressed             This Visit's Progress    RNCM Care Management  Expected Outcome:  Monitor, Self-Manage and Reduce Symptoms of Diabetes       Current Barriers:  Chronic Disease Management support and education needs related to effective management of DM Lab Results  Component Value Date   HGBA1C 7.0 (H) 10/31/2022     Planned Interventions: Provided education to patient about basic DM disease process. Review of goals of A1C.The patient will have new A1c in February. The patient denies any acute changes in her blood sugars. Feels like she is stable and doing well.  Reviewed medications with patient and discussed importance of medication adherence. The patient is compliant with medications. Also works with pharm D for medications management. States that Trulicity is working well for her. She is happy that she does not have any side effects. Has been approved through 2024 to get Trulicity. Will consider Marcelline Deist or London Pepper but wants to talk to her cardiologist before agreeing to use either of these medications. Continues to work with the pharm D on a regular basis.;        Reviewed prescribed diet with patient heart healthy/ADA diet. Review of dietary constraints and the patient states it does get frustrating as she cannot eat the things she wants to eat but she is doing better than what she was with her eating.   The patient states that she is eating more salads now and drinking more water. Has seen big improvements in her dietary habits. Overall feels like she is well balanced with her dietary restrictions Counseled on importance of  regular laboratory monitoring as prescribed. Has regular lab work. ;        Discussed plans with patient for ongoing care management follow up and provided patient with direct contact information for care management team;      Provided patient with written educational materials related to hypo and hyperglycemia and importance of correct treatment. Denies any extreme highs or lows;       Reviewed scheduled/upcoming provider appointments including:  Sees pcp on a regular basis. Education and support given.  Advised patient, providing education and rationale, to check cbg when you have symptoms of low or high blood sugar and as directed   and record. The patient keeps a record of her blood sugars. Review of goal of fasting blood sugars <130 and post prandial of <180. Education provided.    04-08-2023 98  04-07-2023 131  04-06-2023 191, later in the afternoon 128  04-05-2023 120  The highest she has seen is 191 recently. She keeps a record of her readings and has them available during outreaches     call provider for findings outside established parameters;       Referral made to pharmacy team for assistance with ongoing support and education for effective management of medications. The patient works with the pharm D on a regular basis for effective management of medications. ;       Review of patient status, including review of consultants reports, relevant laboratory and other test results, and  medications completed;       Advised patient to discuss changes or concerns about DM and DM health and well being with provider;      Screening for signs and symptoms of depression related to chronic disease state;        Assessed social determinant of health barriers;         Symptom Management: Take medications as prescribed   Attend all scheduled provider appointments Call provider office for new concerns or questions  call the Suicide and Crisis Lifeline: 988 call the [place] National Suicide Prevention  Lifeline: (438)627-4546 or TTY: 580-673-8780 TTY 657 441 9380) to talk to a trained counselor call 1-800-273-TALK (toll free, 24 hour hotline) if experiencing a Mental Health or Behavioral Health Crisis  schedule appointment with eye doctor check feet daily for cuts, sores or redness trim toenails straight across wash and dry feet carefully every day wear comfortable, cotton socks wear comfortable, well-fitting shoes  Follow Up Plan: Telephone follow up appointment with care management team member scheduled for: 05-21-2023 at 1245 pm       RNCM Care Management Expected Outcome:  Monitor, Self-Manage and Reduce Symptoms of Heart Failure       Current Barriers:  Knowledge Deficits related to changes in fluid balance with CHF  Chronic Disease Management support and education needs related to effective management of CHF EF is 60 to 65% on 11-08-2020, new Echo scheduled for 08-07-2022  Wt Readings from Last 3 Encounters:  03/17/23 234 lb (106.1 kg)  02/23/23 235 lb (106.6 kg)  01/10/23 238 lb 1.6 oz (108 kg)  Self reported weight today 11-18-2022 is 240    BP Readings from Last 3 Encounters:  03/17/23 124/60  02/23/23 (!) 141/58  01/10/23 137/65    Planned Interventions: Basic overview and discussion of pathophysiology of Heart Failure reviewed. Review. The patient feels her HF is stable at this time. The patient denies any swelling or edema today. Self reported weight this am was 233 Provided education on low sodium diet. Review of Heart healthy/ADA diet. Education to monitor for changes in water weight versus actual weight. The patient denies any acute changes or shifts in her weight. Education provided. States her weight is 233 to 236 consistently. 04-09-2023 233  03-17-2023 234  02-23-2023 235   Reviewed Heart Failure Action Plan in depth  Assessed need for readable accurate scales in home. The patient has scales and can safely weigh Provided education about placing scale on hard,  flat surface Advised patient to weigh each morning after emptying bladder Discussed importance of daily weight and advised patient to weigh and record daily. The patient feels her swelling and edema has leveled out. She denies any acute water shifts. Is monitoring closely. States she is taking her medications as ordered. Reviewed role of diuretics in prevention of fluid overload and management of heart failure. Is taking Lasix daily currently. Knows when she does not have swelling in her ankles to take only as needed. States she has been taking her Lasix daily the last few days due to swelling. Has had changes in the dosage recently and this is working well for her. Education on monitoring for dehydration. Discussed the importance of keeping all appointments with provider.  The patient knows to call for changes or new needs. Provided patient with education about the role of exercise in the management of heart failure Advised patient to discuss changes in water weight, questions or concerns about HF or heart health with provider Screening for signs  and symptoms of depression related to chronic disease state  Assessed social determinant of health barriers   Symptom Management: Take medications as prescribed   Attend all scheduled provider appointments Call pharmacy for medication refills 3-7 days in advance of running out of medications Call provider office for new concerns or questions  call the Suicide and Crisis Lifeline: 988 call the [place] National Suicide Prevention Lifeline: 873-052-1842 or TTY: (825)625-1763 TTY 331-654-8985) to talk to a trained counselor call 1-800-273-TALK (toll free, 24 hour hotline) if experiencing a Mental Health or Behavioral Health Crisis  call office if I gain more than 2 pounds in one day or 5 pounds in one week keep legs up while sitting track weight in diary use salt in moderation watch for swelling in feet, ankles and legs every day weigh myself  daily develop a rescue plan follow rescue plan if symptoms flare-up track symptoms and what helps feel better or worse dress right for the weather, hot or cold  Follow Up Plan: Telephone follow up appointment with care management team member scheduled for: 05-21-2023 at 1245 pm       RNCM Care Management Expected Outcome:  Monitor, Self-Manage, and Reduce Symptoms of Hypertension       Current Barriers:  Chronic Disease Management support and education needs related to effective management of HTN  BP Readings from Last 3 Encounters:  03/17/23 124/60  02/23/23 (!) 141/58  01/10/23 137/65    Planned Interventions: Evaluation of current treatment plan related to hypertension self management and patient's adherence to plan as established by provider. The patient states her blood pressures have been stable.  Provided education to patient re: stroke prevention, s/s of heart attack and stroke; Reviewed prescribed diet heart healthy/ADA diet. Review and discussed monitoring for hidden sodium and sugars in food and how to effectively balance food intake   Reviewed medications with patient and discussed importance of compliance. Is compliant with medications. Works with the pharm D on a regular basis for effective management of medications;  Discussed plans with patient for ongoing care management follow up and provided patient with direct contact information for care management team; Advised patient, providing education and rationale, to monitor blood pressure daily and record, calling PCP for findings outside established parameters. The patient states that her blood pressures have been good at home. The patient does have know "white coat syndrome" 04-09-2023  138/67, p 76  04-08-2023  133/74, p-75   Reviewed scheduled/upcoming provider appointments including: Sees specialist on a regular basis. Pcp next visit is 06-17-2023  Advised patient to discuss changes in blood pressures and heart health with  provider; Provided education on prescribed diet heart healthy/ADA diet. Education and review.  Discussed complications of poorly controlled blood pressure such as heart disease, stroke, circulatory complications, vision complications, kidney impairment, sexual dysfunction;  Screening for signs and symptoms of depression related to chronic disease state;  Assessed social determinant of health barriers;  Was approved for Orencia for her RA and is happy about this. Takes an injection once a week and she can tell a positive difference in her RA pain. She is thankful for this and thankful for the support she gets from pharm D with cost constraints related to medications.   Symptom Management: Take medications as prescribed   Attend all scheduled provider appointments Call provider office for new concerns or questions  call the Suicide and Crisis Lifeline: 988 call the [place] National Suicide Prevention Lifeline: 712-543-8106 or TTY: 539-422-9442 TTY 512-742-7695) to talk  to a trained counselor call 1-800-273-TALK (toll free, 24 hour hotline) if experiencing a Mental Health or Behavioral Health Crisis  check blood pressure 3 times per week choose a place to take my blood pressure (home, clinic or office, retail store) write blood pressure results in a log or diary learn about high blood pressure keep a blood pressure log take blood pressure log to all doctor appointments call doctor for signs and symptoms of high blood pressure develop an action plan for high blood pressure keep all doctor appointments take medications for blood pressure exactly as prescribed report new symptoms to your doctor  Follow Up Plan: Telephone follow up appointment with care management team member scheduled for: 05-21-2023 at 1245 pm           Consent to Services:  Patient was given information about care management services, agreed to services, and gave verbal consent to participate.   Plan: Telephone follow  up appointment with care management team member scheduled for:  05-21-2023 at 1245, review of new RNCM- Juanell Fairly- [phone]   Alto Denver RN, MSN, CCM RN Care Manager  [facility]  Direct Number: [phone]

## 2023-04-09 NOTE — Patient Instructions (Signed)
Visit Information  Thank you for taking time to visit with me today. Please don't hesitate to contact me if I can be of assistance to you before our next scheduled telephone appointment.  Following are the goals we discussed today:   Goals Addressed             This Visit's Progress    RNCM Care Management  Expected Outcome:  Monitor, Self-Manage and Reduce Symptoms of Diabetes       Current Barriers:  Chronic Disease Management support and education needs related to effective management of DM Lab Results  Component Value Date   HGBA1C 7.0 (H) 10/31/2022     Planned Interventions: Provided education to patient about basic DM disease process. Review of goals of A1C.The patient will have new A1c in February. The patient denies any acute changes in her blood sugars. Feels like she is stable and doing well.  Reviewed medications with patient and discussed importance of medication adherence. The patient is compliant with medications. Also works with pharm D for medications management. States that Trulicity is working well for her. She is happy that she does not have any side effects. Has been approved through 2024 to get Trulicity. Will consider Marcelline Deist or London Pepper but wants to talk to her cardiologist before agreeing to use either of these medications. Continues to work with the pharm D on a regular basis.;        Reviewed prescribed diet with patient heart healthy/ADA diet. Review of dietary constraints and the patient states it does get frustrating as she cannot eat the things she wants to eat but she is doing better than what she was with her eating.   The patient states that she is eating more salads now and drinking more water. Has seen big improvements in her dietary habits. Overall feels like she is well balanced with her dietary restrictions Counseled on importance of regular laboratory monitoring as prescribed. Has regular lab work. ;        Discussed plans with patient for ongoing  care management follow up and provided patient with direct contact information for care management team;      Provided patient with written educational materials related to hypo and hyperglycemia and importance of correct treatment. Denies any extreme highs or lows;       Reviewed scheduled/upcoming provider appointments including:  Sees pcp on a regular basis. Education and support given.  Advised patient, providing education and rationale, to check cbg when you have symptoms of low or high blood sugar and as directed   and record. The patient keeps a record of her blood sugars. Review of goal of fasting blood sugars <130 and post prandial of <180. Education provided.    04-08-2023 98  04-07-2023 131  04-06-2023 191, later in the afternoon 128  04-05-2023 120  The highest she has seen is 191 recently. She keeps a record of her readings and has them available during outreaches     call provider for findings outside established parameters;       Referral made to pharmacy team for assistance with ongoing support and education for effective management of medications. The patient works with the pharm D on a regular basis for effective management of medications. ;       Review of patient status, including review of consultants reports, relevant laboratory and other test results, and medications completed;       Advised patient to discuss changes or concerns about DM and DM health  and well being with provider;      Screening for signs and symptoms of depression related to chronic disease state;        Assessed social determinant of health barriers;         Symptom Management: Take medications as prescribed   Attend all scheduled provider appointments Call provider office for new concerns or questions  call the Suicide and Crisis Lifeline: 988 call the [place] National Suicide Prevention Lifeline: 5595929074 or TTY: (346)014-5524 TTY 339-779-1439) to talk to a trained counselor call 1-800-273-TALK  (toll free, 24 hour hotline) if experiencing a Mental Health or Behavioral Health Crisis  schedule appointment with eye doctor check feet daily for cuts, sores or redness trim toenails straight across wash and dry feet carefully every day wear comfortable, cotton socks wear comfortable, well-fitting shoes  Follow Up Plan: Telephone follow up appointment with care management team member scheduled for: 05-21-2023 at 1245 pm       RNCM Care Management Expected Outcome:  Monitor, Self-Manage and Reduce Symptoms of Heart Failure       Current Barriers:  Knowledge Deficits related to changes in fluid balance with CHF  Chronic Disease Management support and education needs related to effective management of CHF EF is 60 to 65% on 11-08-2020, new Echo scheduled for 08-07-2022  Wt Readings from Last 3 Encounters:  03/17/23 234 lb (106.1 kg)  02/23/23 235 lb (106.6 kg)  01/10/23 238 lb 1.6 oz (108 kg)  Self reported weight today 11-18-2022 is 240    BP Readings from Last 3 Encounters:  03/17/23 124/60  02/23/23 (!) 141/58  01/10/23 137/65    Planned Interventions: Basic overview and discussion of pathophysiology of Heart Failure reviewed. Review. The patient feels her HF is stable at this time. The patient denies any swelling or edema today. Self reported weight this am was 233 Provided education on low sodium diet. Review of Heart healthy/ADA diet. Education to monitor for changes in water weight versus actual weight. The patient denies any acute changes or shifts in her weight. Education provided. States her weight is 233 to 236 consistently. 04-09-2023 233  03-17-2023 234  02-23-2023 235   Reviewed Heart Failure Action Plan in depth  Assessed need for readable accurate scales in home. The patient has scales and can safely weigh Provided education about placing scale on hard, flat surface Advised patient to weigh each morning after emptying bladder Discussed importance of daily weight and  advised patient to weigh and record daily. The patient feels her swelling and edema has leveled out. She denies any acute water shifts. Is monitoring closely. States she is taking her medications as ordered. Reviewed role of diuretics in prevention of fluid overload and management of heart failure. Is taking Lasix daily currently. Knows when she does not have swelling in her ankles to take only as needed. States she has been taking her Lasix daily the last few days due to swelling. Has had changes in the dosage recently and this is working well for her. Education on monitoring for dehydration. Discussed the importance of keeping all appointments with provider.  The patient knows to call for changes or new needs. Provided patient with education about the role of exercise in the management of heart failure Advised patient to discuss changes in water weight, questions or concerns about HF or heart health with provider Screening for signs and symptoms of depression related to chronic disease state  Assessed social determinant of health barriers   Symptom Management:  Take medications as prescribed   Attend all scheduled provider appointments Call pharmacy for medication refills 3-7 days in advance of running out of medications Call provider office for new concerns or questions  call the Suicide and Crisis Lifeline: 988 call the [place] National Suicide Prevention Lifeline: 2364357063 or TTY: 573 524 2388 TTY [phone]) to talk to a trained counselor call 1-800-273-TALK (toll free, 24 hour hotline) if experiencing a Mental Health or Behavioral Health Crisis  call office if I gain more than 2 pounds in one day or 5 pounds in one week keep legs up while sitting track weight in diary use salt in moderation watch for swelling in feet, ankles and legs every day weigh myself daily develop a rescue plan follow rescue plan if symptoms flare-up track symptoms and what helps feel better or worse dress  right for the weather, hot or cold  Follow Up Plan: Telephone follow up appointment with care management team member scheduled for: 05-21-2023 at 1245 pm       RNCM Care Management Expected Outcome:  Monitor, Self-Manage, and Reduce Symptoms of Hypertension       Current Barriers:  Chronic Disease Management support and education needs related to effective management of HTN  BP Readings from Last 3 Encounters:  03/17/23 124/60  02/23/23 (!) 141/58  01/10/23 137/65    Planned Interventions: Evaluation of current treatment plan related to hypertension self management and patient's adherence to plan as established by provider. The patient states her blood pressures have been stable.  Provided education to patient re: stroke prevention, s/s of heart attack and stroke; Reviewed prescribed diet heart healthy/ADA diet. Review and discussed monitoring for hidden sodium and sugars in food and how to effectively balance food intake   Reviewed medications with patient and discussed importance of compliance. Is compliant with medications. Works with the pharm D on a regular basis for effective management of medications;  Discussed plans with patient for ongoing care management follow up and provided patient with direct contact information for care management team; Advised patient, providing education and rationale, to monitor blood pressure daily and record, calling PCP for findings outside established parameters. The patient states that her blood pressures have been good at home. The patient does have know "white coat syndrome" 04-09-2023  138/67, p 76  04-08-2023  133/74, p-75   Reviewed scheduled/upcoming provider appointments including: Sees specialist on a regular basis. Pcp next visit is 06-17-2023  Advised patient to discuss changes in blood pressures and heart health with provider; Provided education on prescribed diet heart healthy/ADA diet. Education and review.  Discussed complications of  poorly controlled blood pressure such as heart disease, stroke, circulatory complications, vision complications, kidney impairment, sexual dysfunction;  Screening for signs and symptoms of depression related to chronic disease state;  Assessed social determinant of health barriers;  Was approved for Orencia for her RA and is happy about this. Takes an injection once a week and she can tell a positive difference in her RA pain. She is thankful for this and thankful for the support she gets from pharm D with cost constraints related to medications.   Symptom Management: Take medications as prescribed   Attend all scheduled provider appointments Call provider office for new concerns or questions  call the Suicide and Crisis Lifeline: 988 call the [place] National Suicide Prevention Lifeline: 618-233-9191 or TTY: 646-032-5868 TTY 570-492-8075) to talk to a trained counselor call 1-800-273-TALK (toll free, 24 hour hotline) if experiencing a Mental Health or Behavioral Health Crisis  check blood pressure 3 times per week choose a place to take my blood pressure (home, clinic or office, retail store) write blood pressure results in a log or diary learn about high blood pressure keep a blood pressure log take blood pressure log to all doctor appointments call doctor for signs and symptoms of high blood pressure develop an action plan for high blood pressure keep all doctor appointments take medications for blood pressure exactly as prescribed report new symptoms to your doctor  Follow Up Plan: Telephone follow up appointment with care management team member scheduled for: 05-21-2023 at 1245 pm           Our next appointment is by telephone on 05-21-2023  at 1245 pm  Please call the care guide team at [phone] if you need to cancel or reschedule your appointment.   If you are experiencing a Mental Health or Behavioral Health Crisis or need someone to talk to, please call the Suicide and  Crisis Lifeline: 988 call the [place] National Suicide Prevention Lifeline: (819) 207-0656 or TTY: 212-700-3788 TTY 512-222-9979) to talk to a trained counselor call 1-800-273-TALK (toll free, 24 hour hotline)   Patient verbalizes understanding of instructions and care plan provided today and agrees to view in MyChart. Active MyChart status and patient understanding of how to access instructions and care plan via MyChart confirmed with patient.     Telephone follow up appointment with care management team member scheduled for: 05-21-2023 at 1245 pm  Alto Denver RN, MSN, CCM RN Care Manager  [facility]  Ambulatory Care Management  Direct Number: [phone]

## 2023-04-12 ENCOUNTER — Other Ambulatory Visit: Payer: Self-pay | Admitting: Family Medicine

## 2023-04-12 DIAGNOSIS — E7800 Pure hypercholesterolemia, unspecified: Secondary | ICD-10-CM

## 2023-04-15 NOTE — Telephone Encounter (Signed)
Requested Prescriptions  Pending Prescriptions Disp Refills   simvastatin (ZOCOR) 20 MG tablet [Pharmacy Med Name: Simvastatin 20 MG Oral Tablet] 90 tablet 0    Sig: TAKE 1 TABLET BY MOUTH ONCE DAILY AT  6PM     Cardiovascular:  Antilipid - Statins Failed - 04/12/2023  4:00 PM      Failed - Lipid Panel in normal range within the last 12 months    Cholesterol, Total  Date Value Ref Range Status  05/03/2020 170 100 - 199 mg/dL Final   Cholesterol  Date Value Ref Range Status  04/24/2022 159 <200 mg/dL Final   LDL Cholesterol (Calc)  Date Value Ref Range Status  04/24/2022 67 mg/dL (calc) Final    Comment:    Reference range: <100 . Desirable range <100 mg/dL for primary prevention;   <70 mg/dL for patients with CHD or diabetic patients  with > or = 2 CHD risk factors. Marland Kitchen LDL-C is now calculated using the Martin-Hopkins  calculation, which is a validated novel method providing  better accuracy than the Friedewald equation in the  estimation of LDL-C.  Horald Pollen et al. Lenox Ahr. 2841;324(40): 2061-2068  (http://education.[employer].com/faq/FAQ164)    HDL  Date Value Ref Range Status  04/24/2022 76 > OR = 50 mg/dL Final  03/02/2535 54 >64 mg/dL Final   Triglycerides  Date Value Ref Range Status  04/24/2022 77 <150 mg/dL Final         Passed - Patient is not pregnant      Passed - Valid encounter within last 12 months    Recent Outpatient Visits           4 weeks ago Diverticulitis   [place] [facility], Netta Neat, DO   2 months ago Type 2 diabetes mellitus with other specified complication, without long-term current use of insulin (HCC)   [place] [facility] D[facility], Gentry Fitz A, RPH-CPP   3 months ago Type 2 diabetes mellitus with other specified complication, without long-term current use of insulin (HCC)   [place] [facility] D[facility], Gentry Fitz A, RPH-CPP   5 months ago Type 2 diabetes  mellitus with other specified complication, without long-term current use of insulin [facility])   [place] [facility] Smitty Cords, DO   10 months ago Dysuria   [place] [facility], Salvadore Oxford, NP       Future Appointments             In 2 months Althea Charon, Netta Neat, DO [facility]   In 2 months End, Cristal Deer, MD [facility]

## 2023-04-17 DIAGNOSIS — M150 Primary generalized (osteo)arthritis: Secondary | ICD-10-CM | POA: Diagnosis not present

## 2023-04-17 DIAGNOSIS — Z7960 Long term (current) use of unspecified immunomodulators and immunosuppressants: Secondary | ICD-10-CM | POA: Diagnosis not present

## 2023-04-17 DIAGNOSIS — M3505 Sjogren syndrome with inflammatory arthritis: Secondary | ICD-10-CM | POA: Diagnosis not present

## 2023-04-17 DIAGNOSIS — M0609 Rheumatoid arthritis without rheumatoid factor, multiple sites: Secondary | ICD-10-CM | POA: Diagnosis not present

## 2023-05-12 DIAGNOSIS — E113393 Type 2 diabetes mellitus with moderate nonproliferative diabetic retinopathy without macular edema, bilateral: Secondary | ICD-10-CM | POA: Diagnosis not present

## 2023-05-12 LAB — HM DIABETES EYE EXAM

## 2023-05-21 ENCOUNTER — Other Ambulatory Visit: Payer: Self-pay

## 2023-05-21 NOTE — Patient Outreach (Addendum)
Care Management   Visit Note  05/21/2023 Name: Diana Harmon MRN: [id number] DOB: 1945-10-13  Subjective: Diana Harmon is a 78 y.o. year old female who is a primary care patient of Smitty Cords, DO. The Care Management team was consulted for assistance.      Engaged with [name] via telephone.  Assessment:  Review of patient past medical history, allergies, medications, health status, including review of consultants reports, laboratory and other test data, was performed as part of  evaluation and provision of care management services.    Outpatient Encounter Medications as of 05/21/2023  Medication Sig   ACCU-CHEK GUIDE test strip USE TO CHECK BLOOD SUGAR TWICE DAILY   Accu-Chek Softclix Lancets lancets USE TO CHECK BLOOD GLUCOSE TWICE DAILY   acetaminophen (TYLENOL) 500 MG tablet Take 1,000 mg by mouth every 6 (six) hours as needed for moderate pain.    Artificial Tear Solution (GENTEAL TEARS) 0.1-0.2-0.3 % SOLN Place 1 drop into both eyes 3 (three) times daily as needed (dry/irritated eyes.).   aspirin 81 MG chewable tablet Chew 1 tablet (81 mg total) by mouth daily.   B COMPLEX-C PO Take 10 mLs by mouth daily.   baclofen (LIORESAL) 10 MG tablet TAKE 1 TO 2 TABLETS BY MOUTH TWICE DAILY AS NEEDED FOR MUSCLE SPASM   Blood Glucose Monitoring Suppl (ACCU-CHEK GUIDE) w/Device KIT Use to check blood sugar twice per day   Cholecalciferol (VITAMIN D3) 125 MCG (5000 UT) TABS Take 5,000 Units by mouth daily.   cyanocobalamin 1000 MCG tablet Take by mouth every other day.   dapagliflozin propanediol (FARXIGA) 10 MG TABS tablet Take 10 mg by mouth daily.   Ferrous Sulfate (IRON) 325 (65 Fe) MG TABS TAKE 1 TABLET BY MOUTH TWICE DAILY WITH A MEAL   furosemide (LASIX) 40 MG tablet Take 1 tablet (40 mg total) by mouth daily. (Patient taking differently: Take 20 mg by mouth daily as needed.)   hydrochlorothiazide (HYDRODIURIL) 25 MG tablet Take 1 tablet by mouth once  daily   hydroxychloroquine (PLAQUENIL) 200 MG tablet Take 1 tablet by mouth 2 (two) times daily.   lactobacillus acidophilus (BACID) TABS tablet Take 1 tablet by mouth daily as needed (digestive health.).   Lancets Misc. KIT Use to check blood sugar twice daily   levocetirizine (XYZAL) 5 MG tablet TAKE 1 TABLET BY MOUTH ONCE DAILY IN THE EVENING (Patient taking differently: Take 1/2 tablet (2.5 mg) every other day in the evening as needed for allergies)   losartan (COZAAR) 50 MG tablet Take 1 tablet by mouth once daily   magnesium oxide (MAG-OX) 400 MG tablet Take 400 mg by mouth daily.   mupirocin ointment (BACTROBAN) 2 % Apply 1 Application topically 2 (two) times daily. (Patient taking differently: Apply 1 Application topically 2 (two) times daily as needed.)   ORENCIA CLICKJECT 125 MG/ML SOAJ Inject into the skin.   simvastatin (ZOCOR) 20 MG tablet TAKE 1 TABLET BY MOUTH ONCE DAILY AT  6PM   TRULICITY 0.75 MG/0.5ML SOPN Inject 0.75 mg as directed once a week.   [DISCONTINUED] Cromolyn Sodium (NASAL ALLERGY NA) Place 1 spray into the nose daily as needed (allergies).   No facility-administered encounter medications on file as of 05/21/2023.    Interventions:  Goals      Monitor, Self-Manage and Reduce Symptoms of Diabetes      Current Barriers:  Care Management support and education needs related to effective management of DM  Planned Interventions: Provided education to  patient about basic DM disease process. Review of goals of A1C. Reports adhering to current plan and feels that she is managing well.  Reviewed medications with and discussed importance of taking medications exactly as prescribed. Reports taking medications as prescribed and managing well. Agreed to keep Pharmacist updated of concerns regarding prescription cost. Provided education regarding prescribed heart healthy/ADA diet. Reports attempting to adhere to recommended diet and feels that her nutritional choices have  improved.  Advised to increase consumption of fruits, vegetables, and lean proteins. Advised to monitor intake of carbohydrates and avoid foods and beverages with added sugar when possible. Providing education and rationale, to check blood sugars regularly. Advised to also check when experiencing symptoms of low or high blood sugar. Review of goal of fasting blood sugars <130 and post prandial of <180. Reports monitoring as advised. Reports fasting reading today was 91mg /dl.   Provided patient with written educational materials related to hypoglycemia and hyperglycemia along with appropriate interventions. Denies hypoglycemic or hyperglycemic episodes.  Discussed importance of completing laboratory monitoring as prescribed.  Discussed importance of completing recommended preventive exams/screening as advised. Reports completing daily foot care. Advised to complete regular dental exams as recommended. Reports Eye Exam is up to date. She is being followed by Ophthalmologist/Dr. Alvester Morin. Discussed plans with for ongoing care management follow up and provided patient with direct contact information for care management team.   Reviewed worsening signs and symptoms that require immediate medical follow up.  Lab Results  Component Value Date   HGBA1C 6.5 (H) 06/10/2023    Symptom Management: Take medications as prescribed   Attend all scheduled provider appointments Call provider office for new concerns or questions  Schedule appointment with eye doctor Check feet daily for cuts, sores or redness Trim toenails straight across Wash and dry feet carefully every day Wear comfortable, cotton socks Wear comfortable, well-fitting shoes            PLAN Will follow up on 06/27/23.    Juanell Fairly [facility] Population Health RN Care Manager Direct Dial: [phone]  Fax: [phone] Website: [email or web address]

## 2023-05-23 ENCOUNTER — Other Ambulatory Visit: Payer: Medicare Other

## 2023-05-30 ENCOUNTER — Ambulatory Visit: Payer: Medicare Other

## 2023-05-30 DIAGNOSIS — Z0000 Encounter for general adult medical examination without abnormal findings: Secondary | ICD-10-CM | POA: Diagnosis not present

## 2023-05-30 NOTE — Patient Instructions (Addendum)
Diana Harmon , Thank you for taking time to come for your Medicare Wellness Visit. I appreciate your ongoing commitment to your health goals. Please review the following plan we discussed and let me know if I can assist you in the future.   Referrals/Orders/Follow-Ups/Clinician Recommendations: NONE  This is a list of the screening recommended for you and due dates:  Health Maintenance  Topic Date Due   DTaP/Tdap/Td vaccine (1 - Tdap) Never done   Eye exam for diabetics  10/23/2022   COVID-19 Vaccine (4 - 2024-25 season) 01/05/2023   Complete foot exam   05/02/2023   Hemoglobin A1C  05/02/2023   Yearly kidney health urinalysis for diabetes  06/08/2023   Yearly kidney function blood test for diabetes  02/23/2024   DEXA scan (bone density measurement)  05/18/2024   Medicare Annual Wellness Visit  05/29/2024   Pneumonia Vaccine  Completed   Flu Shot  Completed   Hepatitis C Screening  Completed   Zoster (Shingles) Vaccine  Completed   HPV Vaccine  Aged Out   Colon Cancer Screening  Discontinued    Advanced directives: (In Chart) A copy of your advanced directives are scanned into your chart should your provider ever need it.  Next Medicare Annual Wellness Visit scheduled for next year: Yes    06/04/24 @ 10:10 BY VIDEO

## 2023-05-30 NOTE — Progress Notes (Signed)
Subjective:   Diana Harmon is a 78 y.o. female who presents for Medicare Annual (Subsequent) preventive examination.  Visit Complete: Virtual I connected with  Diana Harmon on 05/30/23 by a audio enabled telemedicine application and verified that I am speaking with the correct person using two identifiers.  This patient declined Interactive audio and Acupuncturist. Therefore the visit was completed with audio only.   Patient Location: Home  Provider Location: Office/Clinic  I discussed the limitations of evaluation and management by telemedicine. The patient expressed understanding and agreed to proceed.  Vital Signs: Because this visit was a virtual/telehealth visit, some criteria may be missing or patient reported. Any vitals not documented were not able to be obtained and vitals that have been documented are patient reported.  Cardiac Risk Factors include: advanced age (>53men, >59 women);diabetes mellitus;dyslipidemia;hypertension;obesity (BMI >30kg/m2)     Objective:    Today's Vitals   05/30/23 1137  PainSc: 4    There is no height or weight on file to calculate BMI.     05/30/2023   11:42 AM 02/23/2023    4:00 AM 01/10/2023    9:44 AM 01/02/2023    6:10 PM 07/11/2022   10:17 AM 05/24/2022   10:35 AM 01/11/2022   11:03 AM  Advanced Directives  Does Patient Have a Medical Advance Directive? Yes No Yes No Yes Yes Yes  Type of Estate agent of [place];Living will  Healthcare Power of [place];Living will  Healthcare Power of [place];Living will Healthcare Power of [place];Living will Healthcare Power of [place];Living will  Does patient want to make changes to medical advance directive? No - Patient declined     No - Patient declined   Copy of Healthcare Power of Attorney in Chart? Yes - validated most recent copy scanned in chart (See row information)    Yes - validated most recent copy scanned in chart (See row  information) Yes - validated most recent copy scanned in chart (See row information)   Would patient like information on creating a medical advance directive?  No - Patient declined         Current Medications (verified) Outpatient Encounter Medications as of 05/30/2023  Medication Sig   ACCU-CHEK GUIDE test strip USE TO CHECK BLOOD SUGAR TWICE DAILY   Accu-Chek Softclix Lancets lancets USE TO CHECK BLOOD GLUCOSE TWICE DAILY   acetaminophen (TYLENOL) 500 MG tablet Take 1,000 mg by mouth every 6 (six) hours as needed for moderate pain.    Artificial Tear Solution (GENTEAL TEARS) 0.1-0.2-0.3 % SOLN Place 1 drop into both eyes 3 (three) times daily as needed (dry/irritated eyes.).   aspirin 81 MG chewable tablet Chew 1 tablet (81 mg total) by mouth daily.   B COMPLEX-C PO Take 10 mLs by mouth daily.   baclofen (LIORESAL) 10 MG tablet TAKE 1 TO 2 TABLETS BY MOUTH TWICE DAILY AS NEEDED FOR MUSCLE SPASM   Blood Glucose Monitoring Suppl (ACCU-CHEK GUIDE) w/Device KIT Use to check blood sugar twice per day   Cholecalciferol (VITAMIN D3) 125 MCG (5000 UT) TABS Take 5,000 Units by mouth daily.   cyanocobalamin 1000 MCG tablet Take by mouth every other day.   dapagliflozin propanediol (FARXIGA) 10 MG TABS tablet Take 10 mg by mouth daily.   Ferrous Sulfate (IRON) 325 (65 Fe) MG TABS TAKE 1 TABLET BY MOUTH TWICE DAILY WITH A MEAL   furosemide (LASIX) 40 MG tablet Take 1 tablet (40 mg total) by mouth daily. (Patient taking differently:  Take 20 mg by mouth daily as needed.)   hydrochlorothiazide (HYDRODIURIL) 25 MG tablet Take 1 tablet by mouth once daily   hydroxychloroquine (PLAQUENIL) 200 MG tablet Take 1 tablet by mouth 2 (two) times daily.   lactobacillus acidophilus (BACID) TABS tablet Take 1 tablet by mouth daily as needed (digestive health.).   Lancets Misc. KIT Use to check blood sugar twice daily   levocetirizine (XYZAL) 5 MG tablet TAKE 1 TABLET BY MOUTH ONCE DAILY IN THE EVENING (Patient taking  differently: Take 1/2 tablet (2.5 mg) every other day in the evening as needed for allergies)   losartan (COZAAR) 50 MG tablet Take 1 tablet by mouth once daily   magnesium oxide (MAG-OX) 400 MG tablet Take 400 mg by mouth daily.   mupirocin ointment (BACTROBAN) 2 % Apply 1 Application topically 2 (two) times daily. (Patient taking differently: Apply 1 Application topically 2 (two) times daily as needed.)   ORENCIA CLICKJECT 125 MG/ML SOAJ Inject into the skin.   simvastatin (ZOCOR) 20 MG tablet TAKE 1 TABLET BY MOUTH ONCE DAILY AT  6PM   TRULICITY 0.75 MG/0.5ML SOPN Inject 0.75 mg as directed once a week.   [DISCONTINUED] Cromolyn Sodium (NASAL ALLERGY NA) Place 1 spray into the nose daily as needed (allergies).   No facility-administered encounter medications on file as of 05/30/2023.    Allergies (verified) Ferumoxytol, Penicillins, Iron, and Other   History: Past Medical History:  Diagnosis Date   Anemia    Arthritis    legs, knees, back, wrists   Diabetes mellitus without complication (HCC)    Hypertension    Nausea    Patient is [name]    S/P TAVR (transcatheter aortic valve replacement) 11/09/2019   s/p TAVR with a 26 mm Edwards S3U via the TF approach by Dr. Clifton [facility]   Severe aortic stenosis    SIRS (systemic inflammatory response syndrome) (HCC) 11/2019   Sjogren's disease (HCC)    Thyroid disease    Past Surgical History:  Procedure Laterality Date   CARDIAC CATHETERIZATION  10/19/2019   COLON SURGERY     caudarized polyps; rectal carcinoid excision ~ 2011   COLONOSCOPY     COLONOSCOPY WITH PROPOFOL N/A 03/13/2015   Procedure: COLONOSCOPY WITH PROPOFOL;  Surgeon: Scot Jun, MD;  Location: [facility] ENDOSCOPY;  Service: Endoscopy;  Laterality: N/A;   ESOPHAGOGASTRODUODENOSCOPY N/A 03/13/2015   Procedure: ESOPHAGOGASTRODUODENOSCOPY (EGD);  Surgeon: Scot Jun, MD;  Location: [facility] ENDOSCOPY;  Service: Endoscopy;  Laterality: N/A;    INTRAOPERATIVE TRANSTHORACIC ECHOCARDIOGRAM N/A 11/09/2019   Procedure: INTRAOPERATIVE TRANSTHORACIC ECHOCARDIOGRAM;  Surgeon: Kathleene Hazel, MD;  Location: [facility] OR;  Service: Open Heart Surgery;  Laterality: N/A;   PARATHYROIDECTOMY  11/20/2004   right superior parathyroidecomy for primary hyperparathyroidism/parathyroid adenoma   RIGHT/LEFT HEART CATH AND CORONARY ANGIOGRAPHY N/A 10/19/2019   Procedure: RIGHT/LEFT HEART CATH AND CORONARY ANGIOGRAPHY;  Surgeon: Yvonne Kendall, MD;  Location: [facility] INVASIVE CV LAB;  Service: Cardiovascular;  Laterality: N/A;   THYROIDECTOMY     TRANSCATHETER AORTIC VALVE REPLACEMENT, TRANSFEMORAL N/A 11/09/2019   Procedure: TRANSCATHETER AORTIC VALVE REPLACEMENT, TRANSFEMORAL;  Surgeon: Kathleene Hazel, MD;  Location: [facility] OR;  Service: Open Heart Surgery;  Laterality: N/A;   Family History  Problem Relation Age of Onset   Parkinson's disease Mother    Heart disease Father    Lupus Sister    Aneurysm Son    Lupus Son    Breast cancer Maternal Aunt  mat great aunt   Social History   Socioeconomic History   Marital status: Divorced    Spouse name: Not on file   Number of children: 2   Years of education: 12   Highest education level: 12th grade  Occupational History   Occupation: retired-worked with nandicapped adults  Tobacco Use   Smoking status: Former    Current packs/day: 0.00    Types: Cigarettes    Start date: 09/05/1959    Quit date: 09/05/1979    Years since quitting: 43.7   Smokeless tobacco: Former  Building services engineer status: Never Used  Substance and Sexual Activity   Alcohol use: Not Currently   Drug use: No   Sexual activity: Not on file  Other Topics Concern   Not on file  Social History Narrative   Not on file   Social Drivers of Health   Financial Resource Strain: Low Risk  (05/30/2023)   Overall Financial Resource Strain (CARDIA)    Difficulty of Paying Living Expenses: Not hard at all  Recent Concern:  Financial Resource Strain - Medium Risk (03/17/2023)   Overall Financial Resource Strain (CARDIA)    Difficulty of Paying Living Expenses: Somewhat hard  Food Insecurity: No Food Insecurity (05/30/2023)   Hunger Vital Sign    Worried About Running Out of Food in the Last Year: Never true    Ran Out of Food in the Last Year: Never true  Recent Concern: Food Insecurity - Food Insecurity Present (03/17/2023)   Hunger Vital Sign    Worried About Running Out of Food in the Last Year: Sometimes true    Ran Out of Food in the Last Year: Never true  Transportation Needs: No Transportation Needs (05/30/2023)   PRAPARE - Administrator, Civil Service (Medical): No    Lack of Transportation (Non-Medical): No  Physical Activity: Insufficiently Active (05/30/2023)   Exercise Vital Sign    Days of Exercise per Week: 3 days    Minutes of Exercise per Session: 30 min  Stress: No Stress Concern Present (05/30/2023)   [employer] of Occupational Health - Occupational Stress Questionnaire    Feeling of Stress : Not at all  Social Connections: Moderately Isolated (05/30/2023)   Social Connection and Isolation Panel [NHANES]    Frequency of Communication with Friends and Family: Three times a week    Frequency of Social Gatherings with Friends and Family: Twice a week    Attends Religious Services: More than 4 times per year    Active Member of [employer] or Organizations: No    Attends Engineer, structural: Never    Marital Status: Divorced    Tobacco Counseling Counseling given: Not Answered   Clinical Intake:  Pre-visit preparation completed: Yes  Pain : 0-10 Pain Score: 4  Pain Type: Chronic pain Pain Location: Knee Pain Orientation: Right, Left Pain Descriptors / Indicators: Aching, Constant, Discomfort Pain Onset: More than a month ago Pain Frequency: Constant Pain Relieving Factors: tylenol  Pain Relieving Factors: tylenol  Nutritional Status: BMI > 30   Obese Nutritional Risks: None Diabetes: Yes CBG done?: No Did pt. bring in CBG monitor from home?: No  How often do you need to have someone help you when you read instructions, pamphlets, or other written materials from your doctor or pharmacy?: 1 - Never  Interpreter Needed?: No  Information entered by :: Kennedy Bucker, LPN   Activities of Daily Living    05/30/2023   11:43 AM  05/29/2023   11:20 AM  In your present state of health, do you have any difficulty performing the following activities:  Hearing? 0 0  Vision? 0 0  Difficulty concentrating or making decisions? 0 0  Walking or climbing stairs? 1 1  Comment BACK, KNEES   Dressing or bathing? 0 0  Doing errands, shopping? 1 1  Preparing Food and eating ? N N  Using the Toilet? N N  In the past six months, have you accidently leaked urine? N N  Do you have problems with loss of bowel control? N N  Managing your Medications? N N  Managing your Finances? N N  Housekeeping or managing your Housekeeping? N N    Patient Care Team: Smitty Cords, DO as PCP - General (Family Medicine) End, Cristal Deer, MD as PCP - Cardiology (Cardiology) Earna Coder, MD as Consulting Physician (Internal Medicine) Irene Limbo., MD (Ophthalmology) End, Cristal Deer, MD as Consulting Physician (Cardiology) Earna Coder, MD as Consulting Physician (Internal Medicine) Ronney Asters, Jackelyn Poling, RPH-CPP as Pharmacist Juanell Fairly, RN as Case Manager  Indicate any recent Medical Services you may have received from other than [facility] providers in the past year (date may be approximate).     Assessment:   This is a routine wellness examination for Diana Harmon.  Hearing/Vision screen Hearing Screening - Comments:: NO AIDS Vision Screening - Comments:: WEARS GLASSES ALL THE TIME- DR.BELL   Goals Addressed             This Visit's Progress    Cut out extra servings         Depression Screen    05/30/2023    11:40 AM 03/17/2023    1:21 PM 11/06/2022    8:58 AM 05/24/2022   10:32 AM 05/01/2022   10:43 AM 12/10/2021    2:21 PM 05/18/2021   10:31 AM  PHQ 2/9 Scores  PHQ - 2 Score 0 0 0 0 0 0 0  PHQ- 9 Score 0  0 0 0      Fall Risk    05/30/2023   11:43 AM 05/29/2023   11:20 AM 03/17/2023    1:21 PM 11/06/2022    8:58 AM 09/09/2022    1:30 PM  Fall Risk   Falls in the past year? 0 0 0 0 0  Number falls in past yr: 0 0  0 0  Injury with Fall? 0 0  0 0  Risk for fall due to : No Fall Risks   No Fall Risks Impaired balance/gait  Follow up Falls prevention discussed;Falls evaluation completed   Falls evaluation completed Falls evaluation completed;Education provided;Falls prevention discussed    MEDICARE RISK AT HOME: Medicare Risk at Home Any stairs in or around the home?: No If so, are there any without handrails?: No Home free of loose throw rugs in walkways, pet beds, electrical cords, etc?: Yes Adequate lighting in your home to reduce risk of falls?: Yes Life alert?: No Use of a cane, walker or w/c?: Yes (WALKER ALL THE TIME) Grab bars in the bathroom?: No Shower chair or bench in shower?: Yes Elevated toilet seat or a handicapped toilet?: Yes  TIMED UP AND GO:  Was the test performed?  No    Cognitive Function:        05/30/2023   11:45 AM 05/24/2022   10:43 AM 04/25/2020   10:55 AM 04/14/2018   11:09 AM 04/08/2017    3:18 PM  6CIT Screen  What Year? 0  points 0 points 0 points 0 points 0 points  What month? 0 points 0 points 0 points 0 points 0 points  What time? 0 points 0 points 0 points 0 points 0 points  Count back from 20 0 points 0 points 0 points 0 points 0 points  Months in reverse 0 points 0 points 0 points 0 points 0 points  Repeat phrase 0 points 0 points 0 points 6 points 0 points  Total Score 0 points 0 points 0 points 6 points 0 points    Immunizations Immunization History  Administered Date(s) Administered   Fluad Quad(high Dose 65+) 01/12/2019, 02/11/2020    Fluad Trivalent(High Dose 65+) 03/17/2023   Influenza, High Dose Seasonal PF 02/09/2014, 01/05/2015, 01/30/2016, 01/14/2017, 02/17/2021   Influenza,inj,Quad PF,6+ Mos 04/05/2013   Influenza-Unspecified 02/03/2014, 02/03/2018, 01/18/2022   Moderna SARS-COV2 Booster Vaccination 04/25/2021   Moderna Sars-Covid-2 Vaccination 09/25/2019, 10/23/2019, 04/23/2020   Pneumococcal Conjugate-13 08/05/2014   Pneumococcal Polysaccharide-23 04/05/2013   Zoster Recombinant(Shingrix) 08/04/2020, 12/13/2020    TDAP status: Due, Education has been provided regarding the importance of this vaccine. Advised may receive this vaccine at local pharmacy or Health Dept. Aware to provide a copy of the vaccination record if obtained from local pharmacy or Health Dept. Verbalized acceptance and understanding.  Flu Vaccine status: Up to date  Pneumococcal vaccine status: Declined,  Education has been provided regarding the importance of this vaccine but patient still declined. Advised may receive this vaccine at local pharmacy or Health Dept. Aware to provide a copy of the vaccination record if obtained from local pharmacy or Health Dept. Verbalized acceptance and understanding.   Covid-19 vaccine status: Completed vaccines  Qualifies for Shingles Vaccine? Yes   Zostavax completed No   Shingrix Completed?: Yes  Screening Tests Health Maintenance  Topic Date Due   DTaP/Tdap/Td (1 - Tdap) Never done   OPHTHALMOLOGY EXAM  10/23/2022   COVID-19 Vaccine (4 - 2024-25 season) 01/05/2023   FOOT EXAM  05/02/2023   HEMOGLOBIN A1C  05/02/2023   Diabetic kidney evaluation - Urine ACR  06/08/2023   Diabetic kidney evaluation - eGFR measurement  02/23/2024   DEXA SCAN  05/18/2024   Medicare Annual Wellness (AWV)  05/29/2024   Pneumonia Vaccine 74+ Years old  Completed   INFLUENZA VACCINE  Completed   Hepatitis C Screening  Completed   Zoster Vaccines- Shingrix  Completed   HPV VACCINES  Aged Out   Colonoscopy   Discontinued    Health Maintenance  Health Maintenance Due  Topic Date Due   DTaP/Tdap/Td (1 - Tdap) Never done   OPHTHALMOLOGY EXAM  10/23/2022   COVID-19 Vaccine (4 - 2024-25 season) 01/05/2023   FOOT EXAM  05/02/2023   HEMOGLOBIN A1C  05/02/2023   Diabetic kidney evaluation - Urine ACR  06/08/2023    Colorectal cancer screening: No longer required.   Mammogram status: No longer required due to AGE.  DECLINED REFERRAL FOR BDS  Lung Cancer Screening: (Low Dose CT Chest recommended if Age 11-80 years, 20 pack-year currently smoking OR have quit w/in 15years.) does not qualify.    Additional Screening:  Hepatitis C Screening: does qualify; Completed 07/21/17  Vision Screening: Recommended annual ophthalmology exams for early detection of glaucoma and other disorders of the eye. Is the patient up to date with their annual eye exam?  Yes  Who is the provider or what is the name of the office in which the patient attends annual eye exams? DR.BELL If pt is not established with  a provider, would they like to be referred to a provider to establish care? No .   Dental Screening: Recommended annual dental exams for proper oral hygiene  Diabetic Foot Exam: Diabetic Foot Exam: Completed 05/01/22  Community Resource Referral / Chronic Care Management: CRR required this visit?  No   CCM required this visit?  No     Plan:     I have personally reviewed and noted the following in the patient's chart:   Medical and social history Use of alcohol, tobacco or illicit drugs  Current medications and supplements including opioid prescriptions. Patient is not currently taking opioid prescriptions. Functional ability and status Nutritional status Physical activity Advanced directives List of other physicians Hospitalizations, surgeries, and ER visits in previous 12 months Vitals Screenings to include cognitive, depression, and falls Referrals and appointments  In addition, I have  reviewed and discussed with patient certain preventive protocols, quality metrics, and best practice recommendations. A written personalized care plan for preventive services as well as general preventive health recommendations were provided to patient.     Hal Hope, LPN   6/57/8469   After Visit Summary: (MyChart) Due to this being a telephonic visit, the after visit summary with patients personalized plan was offered to patient via MyChart   Nurse Notes: NONE

## 2023-06-02 ENCOUNTER — Other Ambulatory Visit: Payer: Medicare Other

## 2023-06-09 ENCOUNTER — Other Ambulatory Visit: Payer: Medicare Other | Admitting: Pharmacist

## 2023-06-09 DIAGNOSIS — I5032 Chronic diastolic (congestive) heart failure: Secondary | ICD-10-CM

## 2023-06-09 DIAGNOSIS — Z7984 Long term (current) use of oral hypoglycemic drugs: Secondary | ICD-10-CM

## 2023-06-09 DIAGNOSIS — E1169 Type 2 diabetes mellitus with other specified complication: Secondary | ICD-10-CM

## 2023-06-09 NOTE — Progress Notes (Unsigned)
06/09/2023 Name: Diana Harmon MRN: [id number] DOB: [birth year]  Chief Complaint  Patient presents with   Medication Assistance   Medication Management    Diana Harmon is a 78 y.o. year old female who presented for a telephone visit.   They were referred to the pharmacist by their PCP for assistance in managing diabetes, hypertension, hyperlipidemia, and medication access.      Subjective:   Care Team: Primary Care Provider: Smitty Cords, DO; Next Scheduled Visit: 06/17/2023 Rheumatologist: Tracey Harries, MD; Next Scheduled Visit: 08/21/2023 Hematologist: Earna Coder, MD; Next Scheduled Visit: 07/10/2023 Nurse Care Manager: Juanell Fairly, RN; Next Schedule Visit: 06/27/2023 Cardiologist: Yvonne Kendall, MD; Next Scheduled Visit: 06/25/2023  Medication Access/Adherence  Current Pharmacy:  [facility] [address]), [place] - [address] [place]) [place] [address] Phone: [phone] Fax: [phone]  [facility] [address] [place] - [address] [place] [address] Phone: [phone] Fax: [phone]  [email or web address] - [place], [place] - [phone] [facility] [address] [address] [place] [address] Phone: [phone] Fax: [phone]  [facility] - [place], [place] - [address] [place] [address] Phone: [phone] Fax: [phone]  [id number] - [place], [place] - [address] [facility] [address] [place] [address] Phone: [phone] Fax: [phone]   Patient reports affordability concerns with their medications: No Patient reports access/transportation concerns to their pharmacy: No  Patient reports adherence concerns with their medications:  No     Today reports completed courses of both antibiotics and abdominal symptoms  resolved   Reports has noticed knee swelling in legs/more pain since ran out of Orencia     Diabetes:   Current medications:  - Trulicity 0.75 mg weekly  - Farxiga 10 mg daily    Medications tried in the past: metformin   Reports recent morning blood sugar readings ranging 89-125   Patient denies hypoglycemic s/sx including dizziness, shakiness, sweating.    Current physical activity:  Reports exercise limited by arthritis - walks using walker consistently Doing chair exercises from PT/OT and stretching at home again and walking more   Statin therapy: simvastatin 20 mg daily   Current medication access support:  - Enrolled in patient assistance for Trulicity through [employer] through 05/05/2024 - Enrolled in patient assistance for Farxiga through [employer] through 05/05/2024      HTN/Chronic HFpEF :   Current medications:  ACEi/ARB/ARNI: losartan 50 mg daily SGLT2 inhibitor: Farxiga 10 mg daily (reports started ~1 month ago) Diuretic regimen: furosemide 40 mg - 1/2 (20 mg) daily as needed HCTZ 25 mg daily   Last checked blood pressure today, reading:  - Yesterday: 153/65, HR 74 (pain/stress yesterday) - Today: 131/52, HR 73   Reports most blood pressure readings have been <130/80 and that the elevated reading yesterday was an exception  Reports monitoring home weight    Reports leg swelling controlled. Reports elevating legs during the day and wearing compression stockings helps   Current physical activity:  Reports exercise limited by arthritis - walks using walker consistently Doing chair exercises from PT/OT and stretching at home again and walking more   Current medication access support:  - Enrolled in patient assistance for Farxiga through [employer] through 05/05/2024     Objective:  Lab Results  Component Value Date   HGBA1C  7.0 (H) 10/31/2022    Lab Results  Component Value Date   CREATININE 1.32 (H) 02/23/2023   BUN 31 (H) 02/23/2023   NA 138 02/23/2023    K 4.2 02/23/2023   CL 104 02/23/2023   CO2 21 (L) 02/23/2023    Lab Results  Component Value Date   CHOL 159 04/24/2022   HDL 76 04/24/2022   LDLCALC 67 04/24/2022   TRIG 77 04/24/2022   CHOLHDL 2.1 04/24/2022    Medications Reviewed Today   Medications were not reviewed in this encounter       Assessment/Plan:   Note for Medicare patients, the Orencia patient assistance program through [employer] ([employer]) requires patient to meet 3% annual out of pocket expense requirement before would be eligible  Daughter confirms has now signed patient up for notifications/waitlists for Rheumatoid Arthritis (RA) Foundation funds, note [employer], Patient Personal assistant and [employer]  Patient/daughter report that they are going to follow up with Rheumatologist to discuss medication options   Encourage patient to contact office to schedule next follow up appointment with PCP in ~ 1 month   Diabetes: - Currently controlled - Reviewed long term cardiovascular and renal outcomes of uncontrolled blood sugar - Encouraged patient to have regular well-balanced meals throughout the day, while controlling carbohydrate portion sizes - Encouraged patient to continue to use blood sugar checks as feedback on dietary choices - Encouraged patient to continue to monitor home blood sugar, keep log and bring record with her to medical appointments - Patient to follow up with [employer] as needed for refills of Trulicity and with [employer] as needed for refills of Farxiga     HTN/Chronic HFpEF : - Reviewed appropriate blood pressure monitoring technique and reviewed goal blood pressure - Have reviewed to weigh daily and when to contact PCP/cardiology with weight gain     Follow Up Plan: Clinical Pharmacist will follow up with patient by telephone on 07/28/2023 at 1:00 PM    Estelle Grumbles, PharmD, [facility] [phone]

## 2023-06-10 ENCOUNTER — Other Ambulatory Visit: Payer: Medicare Other

## 2023-06-10 DIAGNOSIS — D649 Anemia, unspecified: Secondary | ICD-10-CM | POA: Diagnosis not present

## 2023-06-10 DIAGNOSIS — M0609 Rheumatoid arthritis without rheumatoid factor, multiple sites: Secondary | ICD-10-CM

## 2023-06-10 DIAGNOSIS — I10 Essential (primary) hypertension: Secondary | ICD-10-CM

## 2023-06-10 DIAGNOSIS — E1169 Type 2 diabetes mellitus with other specified complication: Secondary | ICD-10-CM | POA: Diagnosis not present

## 2023-06-10 DIAGNOSIS — E785 Hyperlipidemia, unspecified: Secondary | ICD-10-CM

## 2023-06-10 DIAGNOSIS — Z0000 Encounter for general adult medical examination without abnormal findings: Secondary | ICD-10-CM

## 2023-06-11 LAB — CBC WITH DIFFERENTIAL/PLATELET
Absolute Lymphocytes: 1268 {cells}/uL (ref 850–3900)
Absolute Monocytes: 397 {cells}/uL (ref 200–950)
Basophils Absolute: 19 {cells}/uL (ref 0–200)
Basophils Relative: 0.6 %
Eosinophils Absolute: 90 {cells}/uL (ref 15–500)
Eosinophils Relative: 2.9 %
HCT: 34.9 % — ABNORMAL LOW (ref 35.0–45.0)
Hemoglobin: 11.2 g/dL — ABNORMAL LOW (ref 11.7–15.5)
MCH: 29.9 pg (ref 27.0–33.0)
MCHC: 32.1 g/dL (ref 32.0–36.0)
MCV: 93.1 fL (ref 80.0–100.0)
MPV: 10.2 fL (ref 7.5–12.5)
Monocytes Relative: 12.8 %
Neutro Abs: 1327 {cells}/uL — ABNORMAL LOW (ref 1500–7800)
Neutrophils Relative %: 42.8 %
Platelets: 160 10*3/uL (ref 140–400)
RBC: 3.75 10*6/uL — ABNORMAL LOW (ref 3.80–5.10)
RDW: 13.2 % (ref 11.0–15.0)
Total Lymphocyte: 40.9 %
WBC: 3.1 10*3/uL — ABNORMAL LOW (ref 3.8–10.8)

## 2023-06-11 LAB — COMPLETE METABOLIC PANEL WITH GFR
AG Ratio: 1.2 (calc) (ref 1.0–2.5)
ALT: 17 U/L (ref 6–29)
AST: 24 U/L (ref 10–35)
Albumin: 3.7 g/dL (ref 3.6–5.1)
Alkaline phosphatase (APISO): 75 U/L (ref 37–153)
BUN/Creatinine Ratio: 23 (calc) — ABNORMAL HIGH (ref 6–22)
BUN: 32 mg/dL — ABNORMAL HIGH (ref 7–25)
CO2: 26 mmol/L (ref 20–32)
Calcium: 9.2 mg/dL (ref 8.6–10.4)
Chloride: 109 mmol/L (ref 98–110)
Creat: 1.4 mg/dL — ABNORMAL HIGH (ref 0.60–1.00)
Globulin: 3 g/dL (ref 1.9–3.7)
Glucose, Bld: 111 mg/dL — ABNORMAL HIGH (ref 65–99)
Potassium: 4.4 mmol/L (ref 3.5–5.3)
Sodium: 143 mmol/L (ref 135–146)
Total Bilirubin: 0.3 mg/dL (ref 0.2–1.2)
Total Protein: 6.7 g/dL (ref 6.1–8.1)
eGFR: 39 mL/min/{1.73_m2} — ABNORMAL LOW (ref >=60)

## 2023-06-11 LAB — LIPID PANEL
Cholesterol: 162 mg/dL (ref <200)
HDL: 87 mg/dL (ref >=50)
LDL Cholesterol (Calc): 61 mg/dL
Non-HDL Cholesterol (Calc): 75 mg/dL (ref <130)
Total CHOL/HDL Ratio: 1.9 (calc) (ref <5.0)
Triglycerides: 63 mg/dL (ref <150)

## 2023-06-11 LAB — HEMOGLOBIN A1C
Hgb A1c MFr Bld: 6.5 %{Hb} — ABNORMAL HIGH (ref <5.7)
Mean Plasma Glucose: 140 mg/dL
eAG (mmol/L): 7.7 mmol/L

## 2023-06-11 LAB — MICROALBUMIN / CREATININE URINE RATIO
Creatinine, Urine: 156 mg/dL (ref 20–275)
Microalb, Ur: <0.2 mg/dL

## 2023-06-11 LAB — TSH: TSH: 1.75 m[IU]/L (ref 0.40–4.50)

## 2023-06-17 ENCOUNTER — Encounter: Payer: Self-pay | Admitting: Family Medicine

## 2023-06-17 ENCOUNTER — Ambulatory Visit: Payer: Medicare Other | Admitting: Family Medicine

## 2023-06-17 VITALS — BP 130/68 | HR 77 | Ht 60.0 in | Wt 237.0 lb

## 2023-06-17 DIAGNOSIS — M62838 Other muscle spasm: Secondary | ICD-10-CM | POA: Diagnosis not present

## 2023-06-17 DIAGNOSIS — Z0000 Encounter for general adult medical examination without abnormal findings: Secondary | ICD-10-CM | POA: Diagnosis not present

## 2023-06-17 DIAGNOSIS — E7800 Pure hypercholesterolemia, unspecified: Secondary | ICD-10-CM

## 2023-06-17 DIAGNOSIS — I10 Essential (primary) hypertension: Secondary | ICD-10-CM

## 2023-06-17 DIAGNOSIS — R1310 Dysphagia, unspecified: Secondary | ICD-10-CM

## 2023-06-17 DIAGNOSIS — Z23 Encounter for immunization: Secondary | ICD-10-CM

## 2023-06-17 MED ORDER — HYDROCHLOROTHIAZIDE 25 MG PO TABS: 25.0000 mg | ORAL_TABLET | Freq: Every day | ORAL | 3 refills | Status: DC

## 2023-06-17 MED ORDER — BACLOFEN 10 MG PO TABS: ORAL_TABLET | ORAL | 5 refills | Status: AC

## 2023-06-17 MED ORDER — SIMVASTATIN 20 MG PO TABS: ORAL_TABLET | ORAL | 3 refills | Status: AC

## 2023-06-17 MED ORDER — LOSARTAN POTASSIUM 50 MG PO TABS: 50.0000 mg | ORAL_TABLET | Freq: Every day | ORAL | 3 refills | Status: DC

## 2023-06-17 NOTE — Progress Notes (Unsigned)
Subjective:    Patient ID: Diana Harmon, female    DOB: 1945/08/13, 78 y.o.   MRN: [id number]  Diana Harmon is a 78 y.o. female presenting on 06/17/2023 for No chief complaint on file.   HPI  Discussed the use of AI scribe software for clinical note transcription with the patient, who gave verbal consent to proceed.  History of Present Illness           ***[facility] 05/12/23 - negative DM Retinopathy   Here for Annual Physical and lab Review   CHRONIC DM, Type 2 with Hyperlipidemia, Morbid Obesity HTN CKD-III Creatinine mild elevated 1.4 range, prior 1.2 to 1.4 Prior A1c trend 6.1 to 6.7 in past. She has maintained now at A1c 6.7 Admits diet changes since last visit had been on Trulicity 0.75mg  weekly inj and major improved diet Side effect on metformin has stopped. CBGs: Improved. Avg 140s - stable overall Meds - Trulicity 0.75mg  weekly inj - on financial assistance Reports good compliance, tolerating med well Currently on ARB, ASA 81, Simvastatin 20mg  Lifestyle: Wt gain 5 lbs in 3-4 months Improving DM diet. Low carb. Improved diet with more fish, BBQ, more greens, kale. - Followed by Dr Hulen Luster optometry - last exam done 10/22/21. Negative. Denies hypoglycemia   Anemia, chronic Last lab Hgb 10.9 stable to improved ***Improved Hgb to 11.2   PMH Sjogren's Syndrome Controlled on Hydroxychloroquine She admits issue with itching of skin. She is using eye drops and compresses on eyes.   CHF No recent flare up or sudden wt gain or fluid build up  Prior refer [facility] 2016 ***Swallowing difficulty  - history of dysphagia ***Difficulty with meds in AM, can get choked    Health Maintenance: UTD Flu vaccine  ***Due for Prevnar-20 vaccine, will receive today     06/17/2023    9:29 AM 05/30/2023   11:40 AM 03/17/2023    1:21 PM  Depression screen PHQ 2/9  Decreased Interest 0 0 0  Down, Depressed, Hopeless 0 0 0  PHQ - 2 Score 0 0 0   Altered sleeping  0   Tired, decreased energy  0   Change in appetite  0   Feeling bad or failure about yourself   0   Trouble concentrating  0   Moving slowly or fidgety/restless  0   Suicidal thoughts  0   PHQ-9 Score  0   Difficult doing work/chores  Not difficult at all        06/17/2023    9:29 AM 03/17/2023    1:21 PM 11/06/2022    8:58 AM 05/01/2022   10:43 AM  GAD 7 : Generalized Anxiety Score  Nervous, Anxious, on Edge 0 0 0 0  Control/stop worrying 0 0 0 0  Worry too much - different things 0 0 0 0  Trouble relaxing 0 0 0 0  Restless 0 0 0 0  Easily annoyed or irritable 0 0 0 0  Afraid - awful might happen 0 0 0 0  Total GAD 7 Score 0 0 0 0  Anxiety Difficulty   Not difficult at all Not difficult at all     Past Medical History:  Diagnosis Date   Anemia    Arthritis    legs, knees, back, wrists   Diabetes mellitus without complication (HCC)    Hypertension    Nausea    Patient is [name]    S/P TAVR (transcatheter aortic valve replacement)  11/09/2019   s/p TAVR with a 26 mm Edwards S3U via the TF approach by Dr. Clifton [facility]   Severe aortic stenosis    SIRS (systemic inflammatory response syndrome) (HCC) 11/2019   Sjogren's disease (HCC)    Thyroid disease    Past Surgical History:  Procedure Laterality Date   CARDIAC CATHETERIZATION  10/19/2019   COLON SURGERY     caudarized polyps; rectal carcinoid excision ~ 2011   COLONOSCOPY     COLONOSCOPY WITH PROPOFOL N/A 03/13/2015   Procedure: COLONOSCOPY WITH PROPOFOL;  Surgeon: Scot Jun, MD;  Location: [facility] ENDOSCOPY;  Service: Endoscopy;  Laterality: N/A;   ESOPHAGOGASTRODUODENOSCOPY N/A 03/13/2015   Procedure: ESOPHAGOGASTRODUODENOSCOPY (EGD);  Surgeon: Scot Jun, MD;  Location: [facility] ENDOSCOPY;  Service: Endoscopy;  Laterality: N/A;   INTRAOPERATIVE TRANSTHORACIC ECHOCARDIOGRAM N/A 11/09/2019   Procedure: INTRAOPERATIVE TRANSTHORACIC ECHOCARDIOGRAM;  Surgeon: Kathleene Hazel, MD;  Location: [facility] OR;  Service: Open Heart Surgery;  Laterality: N/A;   PARATHYROIDECTOMY  11/20/2004   right superior parathyroidecomy for primary hyperparathyroidism/parathyroid adenoma   RIGHT/LEFT HEART CATH AND CORONARY ANGIOGRAPHY N/A 10/19/2019   Procedure: RIGHT/LEFT HEART CATH AND CORONARY ANGIOGRAPHY;  Surgeon: Yvonne Kendall, MD;  Location: [facility] INVASIVE CV LAB;  Service: Cardiovascular;  Laterality: N/A;   THYROIDECTOMY     TRANSCATHETER AORTIC VALVE REPLACEMENT, TRANSFEMORAL N/A 11/09/2019   Procedure: TRANSCATHETER AORTIC VALVE REPLACEMENT, TRANSFEMORAL;  Surgeon: Kathleene Hazel, MD;  Location: [facility] OR;  Service: Open Heart Surgery;  Laterality: N/A;   Social History   Socioeconomic History   Marital status: Divorced    Spouse name: Not on file   Number of children: 2   Years of education: 12   Highest education level: 12th grade  Occupational History   Occupation: retired-worked with nandicapped adults  Tobacco Use   Smoking status: Former    Current packs/day: 0.00    Types: Cigarettes    Start date: 09/05/1959    Quit date: 09/05/1979    Years since quitting: 43.8   Smokeless tobacco: Former  Building services engineer status: Never Used  Substance and Sexual Activity   Alcohol use: Not Currently   Drug use: No   Sexual activity: Not on file  Other Topics Concern   Not on file  Social History Narrative   Not on file   Social Drivers of Health   Financial Resource Strain: Low Risk  (05/30/2023)   Overall Financial Resource Strain (CARDIA)    Difficulty of Paying Living Expenses: Not hard at all  Recent Concern: Financial Resource Strain - Medium Risk (03/17/2023)   Overall Financial Resource Strain (CARDIA)    Difficulty of Paying Living Expenses: Somewhat hard  Food Insecurity: No Food Insecurity (05/30/2023)   Hunger Vital Sign    Worried About Running Out of Food in the Last Year: Never true    Ran Out of Food in the Last Year: Never true   Recent Concern: Food Insecurity - Food Insecurity Present (03/17/2023)   Hunger Vital Sign    Worried About Running Out of Food in the Last Year: Sometimes true    Ran Out of Food in the Last Year: Never true  Transportation Needs: No Transportation Needs (05/30/2023)   PRAPARE - Administrator, Civil Service (Medical): No    Lack of Transportation (Non-Medical): No  Physical Activity: Insufficiently Active (05/30/2023)   Exercise Vital Sign    Days of Exercise per Week: 3 days    Minutes of  Exercise per Session: 30 min  Stress: No Stress Concern Present (05/30/2023)   [employer] of Occupational Health - Occupational Stress Questionnaire    Feeling of Stress : Not at all  Social Connections: Moderately Isolated (05/30/2023)   Social Connection and Isolation Panel [NHANES]    Frequency of Communication with Friends and Family: Three times a week    Frequency of Social Gatherings with Friends and Family: Twice a week    Attends Religious Services: More than 4 times per year    Active Member of [employer] or Organizations: No    Attends Banker Meetings: Never    Marital Status: Divorced  Catering manager Violence: Not At Risk (05/30/2023)   Humiliation, Afraid, Rape, and Kick questionnaire    Fear of Current or Ex-Partner: No    Emotionally Abused: No    Physically Abused: No    Sexually Abused: No   Family History  Problem Relation Age of Onset   Parkinson's disease Mother    Heart disease Father    Lupus Sister    Aneurysm Son    Lupus Son    Breast cancer Maternal Aunt        mat great aunt   Current Outpatient Medications on File Prior to Visit  Medication Sig   ACCU-CHEK GUIDE test strip USE TO CHECK BLOOD SUGAR TWICE DAILY   Accu-Chek Softclix Lancets lancets USE TO CHECK BLOOD GLUCOSE TWICE DAILY   acetaminophen (TYLENOL) 500 MG tablet Take 1,000 mg by mouth every 6 (six) hours as needed for moderate pain.    Artificial Tear Solution (GENTEAL  TEARS) 0.1-0.2-0.3 % SOLN Place 1 drop into both eyes 3 (three) times daily as needed (dry/irritated eyes.).   aspirin 81 MG chewable tablet Chew 1 tablet (81 mg total) by mouth daily.   B COMPLEX-C PO Take 10 mLs by mouth daily.   Blood Glucose Monitoring Suppl (ACCU-CHEK GUIDE) w/Device KIT Use to check blood sugar twice per day   Cholecalciferol (VITAMIN D3) 125 MCG (5000 UT) TABS Take 5,000 Units by mouth daily.   cyanocobalamin 1000 MCG tablet Take by mouth every other day.   dapagliflozin propanediol (FARXIGA) 10 MG TABS tablet Take 10 mg by mouth daily.   Ferrous Sulfate (IRON) 325 (65 Fe) MG TABS TAKE 1 TABLET BY MOUTH TWICE DAILY WITH A MEAL   furosemide (LASIX) 40 MG tablet Take 1 tablet (40 mg total) by mouth daily. (Patient taking differently: Take 20 mg by mouth daily as needed.)   hydroxychloroquine (PLAQUENIL) 200 MG tablet Take 1 tablet by mouth 2 (two) times daily.   lactobacillus acidophilus (BACID) TABS tablet Take 1 tablet by mouth daily as needed (digestive health.).   Lancets Misc. KIT Use to check blood sugar twice daily   levocetirizine (XYZAL) 5 MG tablet TAKE 1 TABLET BY MOUTH ONCE DAILY IN THE EVENING (Patient taking differently: Take 1/2 tablet (2.5 mg) every other day in the evening as needed for allergies)   magnesium oxide (MAG-OX) 400 MG tablet Take 400 mg by mouth daily.   mupirocin ointment (BACTROBAN) 2 % Apply 1 Application topically 2 (two) times daily. (Patient taking differently: Apply 1 Application topically 2 (two) times daily as needed.)   ORENCIA CLICKJECT 125 MG/ML SOAJ Inject into the skin.   TRULICITY 0.75 MG/0.5ML SOPN Inject 0.75 mg as directed once a week.   [DISCONTINUED] Cromolyn Sodium (NASAL ALLERGY NA) Place 1 spray into the nose daily as needed (allergies).   No current facility-administered medications  on file prior to visit.    Review of Systems Per HPI unless specifically indicated above     Objective:    BP 130/68   Pulse 77    Ht 5' (1.524 m)   Wt 237 lb (107.5 kg)   SpO2 100%   BMI 46.29 kg/m   Wt Readings from Last 3 Encounters:  06/17/23 237 lb (107.5 kg)  03/17/23 234 lb (106.1 kg)  02/23/23 235 lb (106.6 kg)    Physical Exam  *** Diabetic Foot Exam - Simple   Simple Foot Form Diabetic Foot exam was performed with the following findings: Yes 06/17/2023 10:09 AM  Visual Inspection See comments: Yes Sensation Testing Intact to touch and monofilament testing bilaterally: Yes Pulse Check Posterior Tibialis and Dorsalis pulse intact bilaterally: Yes Comments Bilateral dry skin flaking. Eczema. Callus. No ulceration.    I have personally reviewed the radiology report from 02/23/23 on CT Abdominal Pelvis.   CLINICAL DATA:  lower abd pain.   EXAM: CT ABDOMEN AND PELVIS WITH CONTRAST   TECHNIQUE: Multidetector CT imaging of the abdomen and pelvis was performed using the standard protocol following bolus administration of intravenous contrast.   RADIATION DOSE REDUCTION: This exam was performed according to the departmental dose-optimization program which includes automated exposure control, adjustment of the mA and/or kV according to patient size and/or use of iterative reconstruction technique.   CONTRAST:  80mL OMNIPAQUE IOHEXOL 300 MG/ML  SOLN   COMPARISON:  CT scan angiography chest, abdomen and pelvis from 11/11/2019.   FINDINGS: Lower chest: There are patchy atelectatic changes in the visualized lung bases. No overt consolidation. No pleural effusion. The heart is normal in size. No pericardial effusion. Prosthetic aortic valve noted.   Hepatobiliary: The liver is normal in size. Non-cirrhotic configuration. No suspicious mass. No intrahepatic or extrahepatic bile duct dilation. No calcified gallstones. Normal gallbladder wall thickness. No pericholecystic inflammatory changes.   Pancreas: Unremarkable. No pancreatic ductal dilatation or surrounding inflammatory changes.    Spleen: Within normal limits. No focal lesion.   Adrenals/Urinary Tract: Adrenal glands are unremarkable. No suspicious renal mass. No hydronephrosis. No renal or ureteric calculi. Unremarkable urinary bladder.   Stomach/Bowel: There is a small sliding hiatal hernia. No disproportionate dilation of the small or large bowel loops. No evidence of abnormal bowel wall thickening or inflammatory changes. The appendix is unremarkable. There are multiple diverticula mainly in the sigmoid colon. There is a single diverticulum in the proximal sigmoid colon (series 5, images 62-63) which exhibits subtle surrounding fat stranding. In appropriate clinical setting findings may represent early/subacute diverticulitis.   Vascular/Lymphatic: No ascites or pneumoperitoneum. No abdominal or pelvic lymphadenopathy, by size criteria. No aneurysmal dilation of the major abdominal arteries. There are mild peripheral atherosclerotic vascular calcifications of the aorta and its major branches. There are few venous collaterals in the left lower para-aortic region, which are similar to the prior study.   Reproductive: The uterus is unremarkable. No large adnexal mass.   Other: There are fat containing umbilical and bilateral inguinal hernias. The soft tissues and abdominal wall are otherwise unremarkable.   Musculoskeletal: No suspicious osseous lesions. There are mild multilevel degenerative changes in the visualized spine.   IMPRESSION: 1. Colonic diverticulosis with a single diverticulum in the proximal sigmoid colon which exhibits subtle surrounding fat stranding. In appropriate clinical setting findings may represent early/subacute diverticulitis. 2. Multiple other nonacute observations, as described above.   Aortic Atherosclerosis (ICD10-I70.0).     Electronically Signed   By: Rhea Belton  Ramiro Harvest M.D.   On: 02/23/2023 11:14   Results for orders placed or performed in visit on 06/17/23  HM  DIABETES EYE EXAM   Collection Time: 05/12/23 12:00 AM  Result Value Ref Range   HM Diabetic Eye Exam No Retinopathy No Retinopathy      Assessment & Plan:   Problem List Items Addressed This Visit     Essential hypertension   Relevant Medications   losartan (COZAAR) 50 MG tablet   simvastatin (ZOCOR) 20 MG tablet   hydrochlorothiazide (HYDRODIURIL) 25 MG tablet   Other Visit Diagnoses       Annual physical exam    -  Primary     Need for Streptococcus pneumoniae vaccination       Relevant Orders   Pneumococcal conjugate vaccine 20-valent     Hypercholesteremia       Relevant Medications   losartan (COZAAR) 50 MG tablet   simvastatin (ZOCOR) 20 MG tablet   hydrochlorothiazide (HYDRODIURIL) 25 MG tablet     Muscle spasms of neck       Relevant Medications   baclofen (LIORESAL) 10 MG tablet        Updated Health Maintenance information ***- Reviewed recent lab results with patient Encouraged improvement to lifestyle with diet and exercise -*** Goal of weight loss  Assessment and Plan         ***Referral [facility] for the swallowing   ***Ask Gentry Fitz about a dose increase Trulicity 0.75mg  if eligible    Orders Placed This Encounter  Procedures   Pneumococcal conjugate vaccine 20-valent   HM DIABETES EYE EXAM    This external order was created through the Results Console.    Meds ordered this encounter  Medications   losartan (COZAAR) 50 MG tablet    Sig: Take 1 tablet (50 mg total) by mouth daily.    Dispense:  90 tablet    Refill:  3   simvastatin (ZOCOR) 20 MG tablet    Sig: TAKE 1 TABLET BY MOUTH ONCE DAILY AT  6PM    Dispense:  90 tablet    Refill:  3   hydrochlorothiazide (HYDRODIURIL) 25 MG tablet    Sig: Take 1 tablet (25 mg total) by mouth daily.    Dispense:  90 tablet    Refill:  3   baclofen (LIORESAL) 10 MG tablet    Sig: TAKE 1 TO 2 TABLETS BY MOUTH TWICE DAILY AS NEEDED FOR MUSCLE SPASM    Dispense:  60 tablet    Refill:  5      Follow up plan: Return for 6 month DM A1c.  Saralyn Pilar, DO [facility] 06/17/2023, 9:59 AM

## 2023-06-17 NOTE — Patient Instructions (Addendum)
Thank you for coming to the office today.  Recent Labs    10/31/22 0804 06/10/23 0759  HGBA1C 7.0* 6.5*   We will ask Gentry Fitz about dose increase Trulicity 0.75 up to 1.5mg , if eligible and available  Referral to GI for swallowing dysfunction  [facility] [address] [place], [place] [address] Hours: 8AM-5PM Phone: [phone]  --------------------------    Please schedule a Follow-up Appointment to: Return for 6 month DM A1c.  If you have any other questions or concerns, please feel free to call the office or send a message through MyChart. You may also schedule an earlier appointment if necessary.  Additionally, you may be receiving a survey about your experience at our office within a few days to 1 week by e-mail or mail. We value your feedback.  Saralyn Pilar, DO [facility], [place]

## 2023-06-18 ENCOUNTER — Other Ambulatory Visit: Payer: Self-pay | Admitting: Family Medicine

## 2023-06-18 DIAGNOSIS — E1169 Type 2 diabetes mellitus with other specified complication: Secondary | ICD-10-CM

## 2023-06-18 MED ORDER — TRULICITY 1.5 MG/0.5ML ~~LOC~~ SOAJ: 1.5000 mg | SUBCUTANEOUS | 3 refills | Status: AC

## 2023-06-23 ENCOUNTER — Encounter: Payer: Self-pay | Admitting: Internal Medicine

## 2023-06-23 ENCOUNTER — Other Ambulatory Visit (INDEPENDENT_AMBULATORY_CARE_PROVIDER_SITE_OTHER): Payer: Medicare Other | Admitting: Pharmacist

## 2023-06-23 DIAGNOSIS — E1169 Type 2 diabetes mellitus with other specified complication: Secondary | ICD-10-CM

## 2023-06-23 DIAGNOSIS — Z7985 Long-term (current) use of injectable non-insulin antidiabetic drugs: Secondary | ICD-10-CM

## 2023-06-23 NOTE — Progress Notes (Signed)
   06/23/2023  Patient ID: Diana Harmon, female   DOB: 1945-12-18, 78 y.o.   MRN: [id number]  Outreach to [facility] (dispensing pharmacy for [employer]) on behalf patient today. Speak with representative Julianna who advises program planning to ship new Trulicity dose (1.5 mg weekly) today for arrival tomorrow at daughter's address.  - Follow up with patient to provide update and counseling on dose change. Patient states that she has a 5 week supply of the previous Trulicity 0.75 mg dose remaining and will finish this up before making the dose increase.   Follow Up Plan: Clinical Pharmacist will follow up with patient by telephone on 07/28/2023 at 1:00 PM    Estelle Grumbles, PharmD, [facility] [phone]

## 2023-06-25 ENCOUNTER — Encounter: Payer: Self-pay | Admitting: Internal Medicine

## 2023-06-25 ENCOUNTER — Ambulatory Visit: Payer: Medicare Other | Attending: Internal Medicine | Admitting: Internal Medicine

## 2023-06-25 VITALS — BP 147/69 | HR 86 | Ht 60.0 in | Wt 237.2 lb

## 2023-06-25 DIAGNOSIS — M0609 Rheumatoid arthritis without rheumatoid factor, multiple sites: Secondary | ICD-10-CM

## 2023-06-25 DIAGNOSIS — E785 Hyperlipidemia, unspecified: Secondary | ICD-10-CM

## 2023-06-25 DIAGNOSIS — I350 Nonrheumatic aortic (valve) stenosis: Secondary | ICD-10-CM

## 2023-06-25 DIAGNOSIS — E1169 Type 2 diabetes mellitus with other specified complication: Secondary | ICD-10-CM | POA: Diagnosis not present

## 2023-06-25 DIAGNOSIS — Z952 Presence of prosthetic heart valve: Secondary | ICD-10-CM | POA: Diagnosis not present

## 2023-06-25 DIAGNOSIS — I10 Essential (primary) hypertension: Secondary | ICD-10-CM

## 2023-06-25 NOTE — Progress Notes (Unsigned)
  Cardiology Office Note:  .   Date:  06/25/2023  ID:  Diana Harmon, DOB [birth year], MRN [id number] PCP: Smitty Cords, DO  [facility] Providers Cardiologist:  Yvonne Kendall, MD { Click to update primary MD,subspecialty MD or APP then REFRESH:1}    History of Present Illness: .   Diana Harmon is a 78 y.o. female with history of severe aortic stenosis status post TAVR in 11/2019, hypertension, hyperlipidemia, type 2 diabetes mellitus, and thyroid disease   Was on orencia, which was helping with fluid.  On waiting list to get back on it.  Swelling mostly in arms and joints.  No chest pain, shortness of breath.  Occ dizziness when gets up to quickly.  Lasts 2-3 seconds.  No syncope or falls.  No palpitations.  Weight stable.  Using furosemide 20 mg prn about 3x week.  Home BP usually better, 120/65-135/60.  ROS: See HPI  Studies Reviewed: Marland Kitchen   EKG Interpretation Date/Time:  Wednesday June 25 2023 09:52:03 EST Ventricular Rate:  86 PR Interval:  170 QRS Duration:  138 QT Interval:  416 QTC Calculation: 497 R Axis:   -32  Text Interpretation: Normal sinus rhythm Left axis deviation Right bundle branch block Inferior infarct , age undetermined When compared with ECG of 15-Nov-2021 10:21, No significant change was found Confirmed by Peniel Biel, Cristal Deer [phone]) on 06/25/2023 9:57:23 AM    *** Risk Assessment/Calculations:   {Does this patient have ATRIAL FIBRILLATION?:[phone]} The patient's 1st BP is elevated (>139/89)*** Repeat BP and {Click to enter a 2nd BP Refresh Note  :1}       Physical Exam:   VS:  BP (!) 147/69 (BP Location: Left Arm, Patient Position: Sitting, Cuff Size: Normal)   Pulse 86   Ht 5' (1.524 m)   Wt 237 lb 3.2 oz (107.6 kg)   SpO2 98%   BMI 46.32 kg/m    Wt Readings from Last 3 Encounters:  06/25/23 237 lb 3.2 oz (107.6 kg)  06/17/23 237 lb (107.5 kg)  03/17/23 234 lb (106.1 kg)    General:  NAD. Neck: No JVD or  HJR. Lungs: Clear to auscultation bilaterally without wheezes or crackles. Heart: Regular rate and rhythm without murmurs, rubs, or gallops. Abdomen: Soft, nontender, nondistended. Extremities: No lower extremity edema.  ASSESSMENT AND PLAN: .    ***    {Are you ordering a CV Procedure (e.g. stress test, cath, DCCV, TEE, etc)?   Press F2        :[id number]}  Dispo: ***  Signed, Yvonne Kendall, MD

## 2023-06-25 NOTE — Patient Instructions (Signed)
 Medication Instructions:  Your physician recommends that you continue on your current medications as directed. Please refer to the Current Medication list given to you today.   *If you need a refill on your cardiac medications before your next appointment, please call your pharmacy*   Lab Work: No labs ordered today    Testing/Procedures: No test ordered today    Follow-Up: At [facility], you and your health needs are our priority.  As part of our continuing mission to provide you with exceptional heart care, we have created designated Provider Care Teams.  These Care Teams include your primary Cardiologist (physician) and Advanced Practice Providers (APPs -  Physician Assistants and Nurse Practitioners) who all work together to provide you with the care you need, when you need it.  We recommend signing up for the patient portal called "MyChart".  Sign up information is provided on this After Visit Summary.  MyChart is used to connect with patients for Virtual Visits (Telemedicine).  Patients are able to view lab/test results, encounter notes, upcoming appointments, etc.  Non-urgent messages can be sent to your provider as well.   To learn more about what you can do with MyChart, go to [email or web address].    Your next appointment:   1 year(s)  Provider:   You may see Yvonne Kendall, MD or one of the following Advanced Practice Providers on your designated Care Team:   Nicolasa Ducking, NP Eula Listen, PA-C Cadence Fransico Michael, PA-C Charlsie Quest, NP Carlos Levering, NP

## 2023-06-26 ENCOUNTER — Encounter: Payer: Self-pay | Admitting: Internal Medicine

## 2023-06-27 ENCOUNTER — Other Ambulatory Visit: Payer: Self-pay

## 2023-06-27 ENCOUNTER — Telehealth: Payer: Self-pay

## 2023-06-27 NOTE — Patient Outreach (Signed)
  Care Management   Outreach Note  06/27/2023 Name: Hally Colella MRN: [id number] DOB: [birth year]  An unsuccessful outreach attempt was made today for a scheduled Care Management visit.    Follow Up Plan:  Unable to leave a voice message due to patient's voice mailbox being full. A member of the care team will attempt to reach Ms. Scully next week.    Juanell Fairly [facility] RN Care Manager Direct Dial: [phone]  Fax: [phone] Website: [email or web address]

## 2023-07-09 DIAGNOSIS — R1314 Dysphagia, pharyngoesophageal phase: Secondary | ICD-10-CM | POA: Diagnosis not present

## 2023-07-09 DIAGNOSIS — K449 Diaphragmatic hernia without obstruction or gangrene: Secondary | ICD-10-CM | POA: Diagnosis not present

## 2023-07-09 DIAGNOSIS — Z8719 Personal history of other diseases of the digestive system: Secondary | ICD-10-CM | POA: Diagnosis not present

## 2023-07-10 ENCOUNTER — Inpatient Hospital Stay: Payer: Medicare Other | Admitting: Internal Medicine

## 2023-07-10 ENCOUNTER — Encounter: Payer: Self-pay | Admitting: Internal Medicine

## 2023-07-10 ENCOUNTER — Inpatient Hospital Stay: Payer: Medicare Other | Attending: Internal Medicine

## 2023-07-10 VITALS — BP 137/65 | HR 74 | Temp 98.8°F | Resp 17 | Wt 238.0 lb

## 2023-07-10 DIAGNOSIS — D649 Anemia, unspecified: Secondary | ICD-10-CM

## 2023-07-10 DIAGNOSIS — I129 Hypertensive chronic kidney disease with stage 1 through stage 4 chronic kidney disease, or unspecified chronic kidney disease: Secondary | ICD-10-CM | POA: Diagnosis not present

## 2023-07-10 DIAGNOSIS — E1122 Type 2 diabetes mellitus with diabetic chronic kidney disease: Secondary | ICD-10-CM | POA: Diagnosis not present

## 2023-07-10 DIAGNOSIS — E119 Type 2 diabetes mellitus without complications: Secondary | ICD-10-CM | POA: Diagnosis not present

## 2023-07-10 DIAGNOSIS — E538 Deficiency of other specified B group vitamins: Secondary | ICD-10-CM | POA: Diagnosis not present

## 2023-07-10 DIAGNOSIS — D509 Iron deficiency anemia, unspecified: Secondary | ICD-10-CM | POA: Insufficient documentation

## 2023-07-10 DIAGNOSIS — Z79899 Other long term (current) drug therapy: Secondary | ICD-10-CM | POA: Insufficient documentation

## 2023-07-10 DIAGNOSIS — Z803 Family history of malignant neoplasm of breast: Secondary | ICD-10-CM | POA: Diagnosis not present

## 2023-07-10 DIAGNOSIS — M1990 Unspecified osteoarthritis, unspecified site: Secondary | ICD-10-CM | POA: Diagnosis not present

## 2023-07-10 DIAGNOSIS — Z7985 Long-term (current) use of injectable non-insulin antidiabetic drugs: Secondary | ICD-10-CM | POA: Insufficient documentation

## 2023-07-10 DIAGNOSIS — M3500 Sicca syndrome, unspecified: Secondary | ICD-10-CM | POA: Insufficient documentation

## 2023-07-10 DIAGNOSIS — R220 Localized swelling, mass and lump, head: Secondary | ICD-10-CM | POA: Diagnosis not present

## 2023-07-10 DIAGNOSIS — D709 Neutropenia, unspecified: Secondary | ICD-10-CM | POA: Diagnosis not present

## 2023-07-10 DIAGNOSIS — I10 Essential (primary) hypertension: Secondary | ICD-10-CM | POA: Diagnosis not present

## 2023-07-10 DIAGNOSIS — N183 Chronic kidney disease, stage 3 unspecified: Secondary | ICD-10-CM | POA: Diagnosis not present

## 2023-07-10 DIAGNOSIS — Z7982 Long term (current) use of aspirin: Secondary | ICD-10-CM | POA: Diagnosis not present

## 2023-07-10 DIAGNOSIS — Z87891 Personal history of nicotine dependence: Secondary | ICD-10-CM | POA: Insufficient documentation

## 2023-07-10 DIAGNOSIS — Z7984 Long term (current) use of oral hypoglycemic drugs: Secondary | ICD-10-CM | POA: Diagnosis not present

## 2023-07-10 DIAGNOSIS — R0609 Other forms of dyspnea: Secondary | ICD-10-CM | POA: Insufficient documentation

## 2023-07-10 LAB — CBC WITH DIFFERENTIAL (CANCER CENTER ONLY)
Abs Immature Granulocytes: 0.01 10*3/uL (ref 0.00–0.07)
Basophils Absolute: 0 10*3/uL (ref 0.0–0.1)
Basophils Relative: 1 %
Eosinophils Absolute: 0.1 10*3/uL (ref 0.0–0.5)
Eosinophils Relative: 3 %
HCT: 35.8 % — ABNORMAL LOW (ref 36.0–46.0)
Hemoglobin: 11 g/dL — ABNORMAL LOW (ref 12.0–15.0)
Immature Granulocytes: 0 %
Lymphocytes Relative: 35 %
Lymphs Abs: 1 10*3/uL (ref 0.7–4.0)
MCH: 29.6 pg (ref 26.0–34.0)
MCHC: 30.7 g/dL (ref 30.0–36.0)
MCV: 96.5 fL (ref 80.0–100.0)
Monocytes Absolute: 0.5 10*3/uL (ref 0.1–1.0)
Monocytes Relative: 16 %
Neutro Abs: 1.3 10*3/uL — ABNORMAL LOW (ref 1.7–7.7)
Neutrophils Relative %: 45 %
Platelet Count: 141 10*3/uL — ABNORMAL LOW (ref 150–400)
RBC: 3.71 MIL/uL — ABNORMAL LOW (ref 3.87–5.11)
RDW: 14.2 % (ref 11.5–15.5)
WBC Count: 2.9 10*3/uL — ABNORMAL LOW (ref 4.0–10.5)
nRBC: 0 % (ref 0.0–0.2)

## 2023-07-10 LAB — FERRITIN: Ferritin: 31 ng/mL (ref 11–307)

## 2023-07-10 LAB — BASIC METABOLIC PANEL
Anion gap: 11 (ref 5–15)
BUN: 45 mg/dL — ABNORMAL HIGH (ref 8–23)
CO2: 26 mmol/L (ref 22–32)
Calcium: 9 mg/dL (ref 8.9–10.3)
Chloride: 104 mmol/L (ref 98–111)
Creatinine, Ser: 1.39 mg/dL — ABNORMAL HIGH (ref 0.44–1.00)
GFR, Estimated: 39 mL/min — ABNORMAL LOW (ref >60)
Glucose, Bld: 101 mg/dL — ABNORMAL HIGH (ref 70–99)
Potassium: 4.3 mmol/L (ref 3.5–5.1)
Sodium: 141 mmol/L (ref 135–145)

## 2023-07-10 LAB — IRON AND TIBC
Iron: 78 ug/dL (ref 28–170)
Saturation Ratios: 20 % (ref 10.4–31.8)
TIBC: 398 ug/dL (ref 250–450)
UIBC: 320 ug/dL

## 2023-07-10 LAB — VITAMIN B12: Vitamin B-12: 940 pg/mL — ABNORMAL HIGH (ref 180–914)

## 2023-07-10 NOTE — Progress Notes (Signed)
 Patient is doing well. Has some low energy. Dyspnea with exertion. No visible blood in stools. Takes oral iron. Denies dizziness.

## 2023-07-10 NOTE — Assessment & Plan Note (Addendum)
#  Normocytic anemia unclear etiology; previous history of AVM iron deficiency; September 2020-iron studies negative for deficiency.  Bone marrow biopsy negative for any significant dyspoiesis.  Cytogenetics normal.  # H emoglobin is 9-10 [chronic anemia-? Congential causes]-/? CKD vs others  Slightly lower- ? COVID [aug 2024]; otherwise fairly asymptomatic.Patient is on p.o. iron.  2024- MARCH iron saturation 23% ferritin 39  Iron studies pending today. stable.                            # White count 3.0 chronic benign ethnic neutropenia-neutrophil count 1.4- stable.  # Relative B12 deficiency-200; March 2023- 5000+- continue sublingual B12 tablets 2-3 times a week. Await B12 level from today.stable.Marland Kitchen  #Chronic kidney disease-stage III mild [[facility] Nephrology-[facility]]-overall stable.   # DISPOSITION: # in 6 months-MD; labs- cbc/bmp/ iron studies/ferritin/ b12---Dr.B

## 2023-07-10 NOTE — Progress Notes (Signed)
 [facility] CONSULT NOTE  Patient Care Team: Smitty Cords, DO as PCP - General (Family Medicine) End, Cristal Deer, MD as PCP - Cardiology (Cardiology) Earna Coder, MD as Consulting Physician (Internal Medicine) Irene Limbo., MD (Ophthalmology) End, Cristal Deer, MD as Consulting Physician (Cardiology) Earna Coder, MD as Consulting Physician (Internal Medicine) Ronney Asters, Jackelyn Poling, RPH-CPP as Pharmacist Juanell Fairly, RN as Case Manager  CHIEF COMPLAINTS/PURPOSE OF CONSULTATION:   # AUG 2016- IRON DEFICIENCY ANEMIA-  Possible AVMs of colon [s/p Argon laser; Dr.Elliot;EGD-neg Nov 2016] s/p IV Ferriheme x2 [[id number]; ALLERGY]; Venofer IV [pre-meds]; worsening anemia not responding to iron- November 2020-bone marrow biopsy-erythroid hyperplasia but no dysplasia no blasts. NOV 2020- CT A/P-negative; CXR-NEG.  #Relative B12 deficiency-sublingual B12 [Jan 2021]  #December 2020 -elevated anti-CCP- [s/p evaluation Dr. Caesar Chestnut  # MILD LEUCOPENIA/NEUTROPENIA [facility] 1.2-1.3]-chronic ethnic neutropenia/asymptomatic  # Rectal well diff carcinoid [1.2CM; s/p polypectomy 2011]; Allergy- Etta Quill [Nov 2016]- neck/face swelling [improved with benadryl]; June 2021- TAVR for severe AS  HISTORY OF PRESENTING ILLNESS: Patient accompanied by daughter.  In a wheelchair.  Diana Harmon 78 y.o.  female is here for follow-up for anemia of unclear etiology is here for follow-up.  Patient is doing well. Has some low energy. Chronic dyspnea with exertion. No visible blood in stools. Takes oral iron. Denies dizzines   No weight loss no blood in stools or black or stools.     Review of Systems  Constitutional:  Negative for chills, diaphoresis, fever and weight loss.  HENT:  Negative for nosebleeds and sore throat.   Eyes:  Negative for double vision.  Respiratory:  Negative for cough, hemoptysis, sputum production, shortness of breath and  wheezing.   Cardiovascular:  Negative for chest pain, palpitations, orthopnea and leg swelling.  Gastrointestinal:  Negative for abdominal pain, blood in stool, constipation, diarrhea, heartburn, melena, nausea and vomiting.  Genitourinary:  Negative for dysuria, frequency and urgency.  Musculoskeletal:  Positive for joint pain. Negative for back pain.  Skin: Negative.  Negative for itching and rash.  Neurological:  Negative for dizziness, tingling, focal weakness, weakness and headaches.  Endo/Heme/Allergies:  Does not bruise/bleed easily.  Psychiatric/Behavioral:  Negative for depression. The patient is not nervous/anxious and does not have insomnia.      MEDICAL HISTORY:  Past Medical History:  Diagnosis Date   Anemia    Arthritis    legs, knees, back, wrists   Diabetes mellitus without complication (HCC)    Hypertension    Nausea    Patient is [name]    S/P TAVR (transcatheter aortic valve replacement) 11/09/2019   s/p TAVR with a 26 mm Edwards S3U via the TF approach by Dr. Clifton James & Bartle   Severe aortic stenosis    SIRS (systemic inflammatory response syndrome) (HCC) 11/2019   Sjogren's disease (HCC)    Thyroid disease     SURGICAL HISTORY: Past Surgical History:  Procedure Laterality Date   CARDIAC CATHETERIZATION  10/19/2019   COLON SURGERY     caudarized polyps; rectal carcinoid excision ~ 2011   COLONOSCOPY     COLONOSCOPY WITH PROPOFOL N/A 03/13/2015   Procedure: COLONOSCOPY WITH PROPOFOL;  Surgeon: Scot Jun, MD;  Location[facility] ENDOSCOPY;  Service: Endoscopy;  Laterality: N/A;   ESOPHAGOGASTRODUODENOSCOPY N/A 03/13/2015   Procedure: ESOPHAGOGASTRODUODENOSCOPY (EGD);  Surgeon: Scot Jun, MD;  Location: [facility] ENDOSCOPY;  Service: Endoscopy;  Laterality: N/A;   INTRAOPERATIVE TRANSTHORACIC ECHOCARDIOGRAM N/A 11/09/2019   Procedure: INTRAOPERATIVE TRANSTHORACIC ECHOCARDIOGRAM;  Surgeon:  Kathleene Hazel, MD;  Location: [facility] OR;  Service:  Open Heart Surgery;  Laterality: N/A;   PARATHYROIDECTOMY  11/20/2004   right superior parathyroidecomy for primary hyperparathyroidism/parathyroid adenoma   RIGHT/LEFT HEART CATH AND CORONARY ANGIOGRAPHY N/A 10/19/2019   Procedure: RIGHT/LEFT HEART CATH AND CORONARY ANGIOGRAPHY;  Surgeon: Yvonne Kendall, MD;  Location: [facility] INVASIVE CV LAB;  Service: Cardiovascular;  Laterality: N/A;   THYROIDECTOMY     TRANSCATHETER AORTIC VALVE REPLACEMENT, TRANSFEMORAL N/A 11/09/2019   Procedure: TRANSCATHETER AORTIC VALVE REPLACEMENT, TRANSFEMORAL;  Surgeon: Kathleene Hazel, MD;  Location: [facility] OR;  Service: Open Heart Surgery;  Laterality: N/A;    SOCIAL HISTORY: Social History   Socioeconomic History   Marital status: Divorced    Spouse name: Not on file   Number of children: 2   Years of education: 12   Highest education level: 12th grade  Occupational History   Occupation: retired-worked with nandicapped adults  Tobacco Use   Smoking status: Former    Current packs/day: 0.00    Types: Cigarettes    Start date: 09/05/1959    Quit date: 09/05/1979    Years since quitting: 43.8   Smokeless tobacco: Former  Building services engineer status: Never Used  Substance and Sexual Activity   Alcohol use: Not Currently   Drug use: No   Sexual activity: Not on file  Other Topics Concern   Not on file  Social History Narrative   Not on file   Social Drivers of Health   Financial Resource Strain: Medium Risk (07/09/2023)   Received from [facility]   Overall Financial Resource Strain (CARDIA)    Difficulty of Paying Living Expenses: Somewhat hard  Food Insecurity: No Food Insecurity (07/09/2023)   [facility]   Hunger Vital Sign    Worried About Running Out of Food in the Last Year: Never true    Ran Out of Food in the Last Year: Never true  Transportation Needs: No Transportation Needs (07/09/2023)   Received from [facility] - Transportation    In the past 12 months, has lack of transportation kept you from medical appointments or from getting medications?: No    Lack of Transportation (Non-Medical): No  Physical Activity: Insufficiently Active (05/30/2023)   Exercise Vital Sign    Days of Exercise per Week: 3 days    Minutes of Exercise per Session: 30 min  Stress: No Stress Concern Present (05/30/2023)   [employer] of Occupational Health - Occupational Stress Questionnaire    Feeling of Stress : Not at all  Social Connections: Moderately Isolated (05/30/2023)   Social Connection and Isolation Panel [NHANES]    Frequency of Communication with Friends and Family: Three times a week    Frequency of Social Gatherings with Friends and Family: Twice a week    Attends Religious Services: More than 4 times per year    Active Member of [employer] or Organizations: No    Attends Banker Meetings: Never    Marital Status: Divorced  Catering manager Violence: Not At Risk (05/30/2023)   Humiliation, Afraid, Rape, and Kick questionnaire    Fear of Current or Ex-Partner: No    Emotionally Abused: No    Physically Abused: No    Sexually Abused: No    FAMILY HISTORY: Family History  Problem Relation Age of Onset   Parkinson's disease Mother    Heart disease Father    Lupus  Sister    Aneurysm Son    Lupus Son    Breast cancer Maternal Aunt        mat great aunt    ALLERGIES:  is allergic to ferumoxytol, penicillins, iron, and other.  MEDICATIONS:  Current Outpatient Medications  Medication Sig Dispense Refill   ACCU-CHEK GUIDE test strip USE TO CHECK BLOOD SUGAR TWICE DAILY 200 each 3   Accu-Chek Softclix Lancets lancets USE TO CHECK BLOOD GLUCOSE TWICE DAILY 200 each 2   acetaminophen (TYLENOL) 500 MG tablet Take 1,000 mg by mouth every 6 (six) hours as needed for moderate pain.      Artificial Tear Solution (GENTEAL TEARS) 0.1-0.2-0.3 % SOLN Place 1 drop into both eyes 3 (three) times  daily as needed (dry/irritated eyes.).     aspirin 81 MG chewable tablet Chew 1 tablet (81 mg total) by mouth daily.     B COMPLEX-C PO Take 10 mLs by mouth daily.     baclofen (LIORESAL) 10 MG tablet TAKE 1 TO 2 TABLETS BY MOUTH TWICE DAILY AS NEEDED FOR MUSCLE SPASM 60 tablet 5   Blood Glucose Monitoring Suppl (ACCU-CHEK GUIDE) w/Device KIT Use to check blood sugar twice per day 1 kit 0   Cholecalciferol (VITAMIN D3) 125 MCG (5000 UT) TABS Take 5,000 Units by mouth daily.     cyanocobalamin 1000 MCG tablet Take by mouth every other day.     dapagliflozin propanediol (FARXIGA) 10 MG TABS tablet Take 10 mg by mouth daily.     Ferrous Sulfate (IRON) 325 (65 Fe) MG TABS TAKE 1 TABLET BY MOUTH TWICE DAILY WITH A MEAL 180 each 1   furosemide (LASIX) 40 MG tablet Take 1 tablet (40 mg total) by mouth daily. (Patient taking differently: Take 20 mg by mouth daily as needed.) 90 tablet 3   hydrochlorothiazide (HYDRODIURIL) 25 MG tablet Take 1 tablet (25 mg total) by mouth daily. 90 tablet 3   hydroxychloroquine (PLAQUENIL) 200 MG tablet Take 1 tablet by mouth 2 (two) times daily.     lactobacillus acidophilus (BACID) TABS tablet Take 1 tablet by mouth daily as needed (digestive health.).     Lancets Misc. KIT Use to check blood sugar twice daily     levocetirizine (XYZAL) 5 MG tablet TAKE 1 TABLET BY MOUTH ONCE DAILY IN THE EVENING (Patient taking differently: Take 1/2 tablet (2.5 mg) every other day in the evening as needed for allergies) 90 tablet 3   losartan (COZAAR) 50 MG tablet Take 1 tablet (50 mg total) by mouth daily. 90 tablet 3   magnesium oxide (MAG-OX) 400 MG tablet Take 400 mg by mouth daily.     mupirocin ointment (BACTROBAN) 2 % Apply 1 Application topically 2 (two) times daily. (Patient taking differently: Apply 1 Application topically 2 (two) times daily as needed.) 22 g 3   ORENCIA CLICKJECT 125 MG/ML SOAJ Inject into the skin.     simvastatin (ZOCOR) 20 MG tablet TAKE 1 TABLET BY MOUTH  ONCE DAILY AT  6PM 90 tablet 3   TRULICITY 1.5 MG/0.5ML SOAJ Inject 1.5 mg into the skin once a week. 8 mL 3   No current facility-administered medications for this visit.    Patient is obese.  PHYSICAL EXAMINATION: ECOG PERFORMANCE STATUS: 1 - Symptomatic but completely ambulatory  Vitals:   07/10/23 1017  BP: 137/65  Pulse: 74  Resp: 17  Temp: 98.8 F (37.1 C)  SpO2: 100%     Filed Weights   07/10/23 1017  Weight: 238 lb (108 kg)      Physical Exam HENT:     Head: Normocephalic and atraumatic.     Mouth/Throat:     Pharynx: No oropharyngeal exudate.  Eyes:     Pupils: Pupils are equal, round, and reactive to light.  Cardiovascular:     Rate and Rhythm: Normal rate and regular rhythm.  Pulmonary:     Effort: Pulmonary effort is normal. No respiratory distress.     Breath sounds: Normal breath sounds. No wheezing.  Abdominal:     General: Bowel sounds are normal. There is no distension.     Palpations: Abdomen is soft. There is no mass.     Tenderness: There is no abdominal tenderness. There is no guarding or rebound.  Musculoskeletal:        General: No tenderness. Normal range of motion.     Cervical back: Normal range of motion and neck supple.  Skin:    General: Skin is warm.  Neurological:     Mental Status: She is alert and oriented to person, place, and time.  Psychiatric:        Mood and Affect: Affect normal.     Latest Reference Range & Units 01/11/21 10:08  Iron 28 - 170 ug/dL 696  UIBC ug/dL 295  TIBC 284 - 132 ug/dL 440  Saturation Ratios 10.4 - 31.8 % 32 (H)  Ferritin 11 - 307 ng/mL 196  (H): Data is abnormally high  LABORATORY DATA:  I have reviewed the data as listed Lab Results  Component Value Date   WBC 2.9 (L) 07/10/2023   HGB 11.0 (L) 07/10/2023   HCT 35.8 (L) 07/10/2023   MCV 96.5 07/10/2023   PLT 141 (L) 07/10/2023   Recent Labs    10/31/22 0804 01/10/23 0939 02/23/23 0402 06/10/23 0759 07/10/23 1002  NA 141 139 138  143 141  K 4.4 4.1 4.2 4.4 4.3  CL 103 106 104 109 104  CO2 30 25 21* 26 26  GLUCOSE 184* 108* 131* 111* 101*  BUN 56* 26* 31* 32* 45*  CREATININE 1.53* 1.23* 1.32* 1.40* 1.39*  CALCIUM 9.5 9.0 8.7* 9.2 9.0  GFRNONAA  --  46* 42*  --  39*  PROT 6.7  --  7.2 6.7  --   ALBUMIN  --   --  3.5  --   --   AST 32  --  32 24  --   ALT 23  --  19 17  --   ALKPHOS  --   --  55  --   --   BILITOT 0.3  --  1.2 0.3  --     ASSESSMENT & PLAN:  Normocytic anemia #Normocytic anemia unclear etiology; previous history of AVM iron deficiency; September 2020-iron studies negative for deficiency.  Bone marrow biopsy negative for any significant dyspoiesis.  Cytogenetics normal.  # H emoglobin is 9-10 [chronic anemia-? Congential causes]-/? CKD vs others  Slightly lower- ? COVID [aug 2024]; otherwise fairly asymptomatic.Patient is on p.o. iron.  2024- MARCH iron saturation 23% ferritin 39  Iron studies pending today. stable.                            # White count 3.0 chronic benign ethnic neutropenia-neutrophil count 1.4- stable.  # Relative B12 deficiency-200; March 2023- 5000+- continue sublingual B12 tablets 2-3 times a week. Await B12 level from today.stable.Marland Kitchen  #Chronic kidney disease-stage III mild [  [facility]]-overall stable.   # DISPOSITION: # in 6 months-MD; labs- cbc/bmp/ iron studies/ferritin/ b12---Dr.B     Earna Coder, MD 07/10/2023 11:41 AM

## 2023-07-10 NOTE — Progress Notes (Signed)
Fatigue/weakness: Dyspena: Light headedness: Blood in stool:

## 2023-07-11 ENCOUNTER — Other Ambulatory Visit: Payer: Self-pay

## 2023-07-11 NOTE — Patient Instructions (Addendum)
 Thank you for allowing the Care Management team to participate in your care. It was great speaking with you!  We will follow up on August 11, 2023 at 1 pm. Please do not hesitate to contact me if you require assistance prior to our next outreach.    Juanell Fairly [facility] Population Health RN Care Manager Direct Dial: [phone]  Fax: [phone] Website: [email or web address]

## 2023-07-11 NOTE — Patient Outreach (Signed)
 Care Management   Visit Note  07/11/2023 Name: Diana Harmon MRN: [id number] DOB: 10/23/1945  Subjective: Diana Harmon is a 78 y.o. year old female who is a primary care patient of Diana Cords, DO. Engaged with Diana Harmon via telephone today.   Assessment:  Review of patient past medical history, allergies, medications, health status, including review of consultants reports, laboratory and other test data, was performed as part of  evaluation and provision of care management services.   Outpatient Encounter Medications as of 07/11/2023  Medication Sig   ACCU-CHEK GUIDE test strip USE TO CHECK BLOOD SUGAR TWICE DAILY   Accu-Chek Softclix Lancets lancets USE TO CHECK BLOOD GLUCOSE TWICE DAILY   acetaminophen (TYLENOL) 500 MG tablet Take 1,000 mg by mouth every 6 (six) hours as needed for moderate pain.    Artificial Tear Solution (GENTEAL TEARS) 0.1-0.2-0.3 % SOLN Place 1 drop into both eyes 3 (three) times daily as needed (dry/irritated eyes.).   aspirin 81 MG chewable tablet Chew 1 tablet (81 mg total) by mouth daily.   B COMPLEX-C PO Take 10 mLs by mouth daily.   baclofen (LIORESAL) 10 MG tablet TAKE 1 TO 2 TABLETS BY MOUTH TWICE DAILY AS NEEDED FOR MUSCLE SPASM   Blood Glucose Monitoring Suppl (ACCU-CHEK GUIDE) w/Device KIT Use to check blood sugar twice per day   Cholecalciferol (VITAMIN D3) 125 MCG (5000 UT) TABS Take 5,000 Units by mouth daily.   cyanocobalamin 1000 MCG tablet Take by mouth every other day.   dapagliflozin propanediol (FARXIGA) 10 MG TABS tablet Take 10 mg by mouth daily.   Ferrous Sulfate (IRON) 325 (65 Fe) MG TABS TAKE 1 TABLET BY MOUTH TWICE DAILY WITH A MEAL   furosemide (LASIX) 40 MG tablet Take 1 tablet (40 mg total) by mouth daily. (Patient taking differently: Take 20 mg by mouth daily as needed.)   hydrochlorothiazide (HYDRODIURIL) 25 MG tablet Take 1 tablet (25 mg total) by mouth daily.   hydroxychloroquine (PLAQUENIL) 200 MG  tablet Take 1 tablet by mouth 2 (two) times daily.   lactobacillus acidophilus (BACID) TABS tablet Take 1 tablet by mouth daily as needed (digestive health.).   Lancets Misc. KIT Use to check blood sugar twice daily   levocetirizine (XYZAL) 5 MG tablet TAKE 1 TABLET BY MOUTH ONCE DAILY IN THE EVENING (Patient taking differently: Take 1/2 tablet (2.5 mg) every other day in the evening as needed for allergies)   losartan (COZAAR) 50 MG tablet Take 1 tablet (50 mg total) by mouth daily.   magnesium oxide (MAG-OX) 400 MG tablet Take 400 mg by mouth daily.   mupirocin ointment (BACTROBAN) 2 % Apply 1 Application topically 2 (two) times daily. (Patient taking differently: Apply 1 Application topically 2 (two) times daily as needed.)   ORENCIA CLICKJECT 125 MG/ML SOAJ Inject into the skin.   simvastatin (ZOCOR) 20 MG tablet TAKE 1 TABLET BY MOUTH ONCE DAILY AT  6PM   TRULICITY 1.5 MG/0.5ML SOAJ Inject 1.5 mg into the skin once a week.   [DISCONTINUED] Cromolyn Sodium (NASAL ALLERGY NA) Place 1 spray into the nose daily as needed (allergies).   No facility-administered encounter medications on file as of 07/11/2023.    Interventions:  Interventions Today    Flowsheet Row Most Recent Value  Chronic Disease   Chronic disease during today's visit Hypertension (HTN), Congestive Heart Failure (CHF), Diabetes  General Interventions   General Interventions Discussed/Reviewed General Interventions Reviewed, Labs, Durable Medical Equipment (DME), Doctor Visits  Labs Kidney Function  [Pending A1C]  Doctor Visits Discussed/Reviewed Doctor Visits Reviewed  Durable Medical Equipment (DME) BP Cuff, Glucomoter  Exercise Interventions   Exercise Discussed/Reviewed Assistive device use and maintanence  Education Interventions   Education Provided Provided Education  Provided Verbal Education On Nutrition, Foot Care, Eye Care, Labs, Blood Sugar Monitoring, Medication, When to see the doctor  Nutrition  Interventions   Nutrition Discussed/Reviewed Nutrition Reviewed, Carbohydrate meal planning, Adding fruits and vegetables, Decreasing sugar intake, Increasing proteins, Decreasing fats, Decreasing salt, Portion sizes  Pharmacy Interventions   Pharmacy Dicussed/Reviewed Medications and their functions  Safety Interventions   Safety Discussed/Reviewed Safety Reviewed        PLAN Diana Harmon agreed to follow up next month.    Juanell Fairly [facility] Population Health RN Care Manager Direct Dial: [phone]  Fax: [phone] Website: [email or web address]

## 2023-07-21 ENCOUNTER — Other Ambulatory Visit: Payer: Self-pay | Admitting: Gastroenterology

## 2023-07-21 DIAGNOSIS — R1314 Dysphagia, pharyngoesophageal phase: Secondary | ICD-10-CM

## 2023-07-21 DIAGNOSIS — K449 Diaphragmatic hernia without obstruction or gangrene: Secondary | ICD-10-CM

## 2023-07-25 ENCOUNTER — Ambulatory Visit
Admission: RE | Admit: 2023-07-25 | Discharge: 2023-07-25 | Disposition: A | Discharge status: Home / Self Care | Source: Ambulatory Visit | Attending: Gastroenterology | Admitting: Gastroenterology

## 2023-07-25 DIAGNOSIS — R1314 Dysphagia, pharyngoesophageal phase: Secondary | ICD-10-CM | POA: Diagnosis not present

## 2023-07-25 DIAGNOSIS — K449 Diaphragmatic hernia without obstruction or gangrene: Secondary | ICD-10-CM | POA: Diagnosis not present

## 2023-07-25 DIAGNOSIS — Q394 Esophageal web: Secondary | ICD-10-CM | POA: Diagnosis not present

## 2023-07-25 DIAGNOSIS — R0989 Other specified symptoms and signs involving the circulatory and respiratory systems: Secondary | ICD-10-CM | POA: Diagnosis not present

## 2023-07-28 ENCOUNTER — Other Ambulatory Visit: Payer: Medicare Other

## 2023-08-04 ENCOUNTER — Other Ambulatory Visit

## 2023-08-11 ENCOUNTER — Other Ambulatory Visit: Payer: Self-pay

## 2023-08-11 NOTE — Patient Outreach (Signed)
 Complex Care Management   Visit Note  08/11/2023  Name:  Diana Harmon MRN: [id number] DOB: [birth year]  Situation: Referral received for Complex Care Management related to {Criteria:32550} I obtained verbal consent from ***.  Visit completed with ***  {VISIT LOCATION:32553}  Background:   Past Medical History:  Diagnosis Date   Anemia    Arthritis    legs, knees, back, wrists   Diabetes mellitus without complication (HCC)    Hypertension    Nausea    Patient is [name]    S/P TAVR (transcatheter aortic valve replacement) 11/09/2019   s/p TAVR with a 26 mm Edwards S3U via the TF approach by Dr. Clifton James & Bartle   Severe aortic stenosis    SIRS (systemic inflammatory response syndrome) (HCC) 11/2019   Sjogren's disease (HCC)    Thyroid disease     Assessment: Patient Reported Symptoms:  Cognitive    Neurological      HEENT      Cardiovascular      Respiratory      Endocrine      Gastrointestinal      Genitourinary      Integumentary      Musculoskeletal      Psychosocial       There were no vitals filed for this visit.  Medications Reviewed Today   Medications were not reviewed in this encounter     Recommendation:   {RECOMMENDATONS:32554}  Follow Up Plan:   {FOLLOWUP:32559}  SIG ***

## 2023-08-11 NOTE — Patient Instructions (Signed)
 Thank you for allowing the Care Management team to participate in your care. It was great speaking with  you today. Keep up the great work!!  Your next care management appointment is by telephone on Sep 19, 2023 at 1 pm. Please do not hesitate to contact me if you require assistance prior to our next outreach.   Juanell Fairly [facility] Population Health RN Care Manager Direct Dial: [phone]  Fax: [phone] Website: [email or web address]

## 2023-08-19 DIAGNOSIS — M150 Primary generalized (osteo)arthritis: Secondary | ICD-10-CM | POA: Diagnosis not present

## 2023-08-19 DIAGNOSIS — Z7960 Long term (current) use of unspecified immunomodulators and immunosuppressants: Secondary | ICD-10-CM | POA: Diagnosis not present

## 2023-08-19 DIAGNOSIS — M0609 Rheumatoid arthritis without rheumatoid factor, multiple sites: Secondary | ICD-10-CM | POA: Diagnosis not present

## 2023-08-19 DIAGNOSIS — M3505 Sjogren syndrome with inflammatory arthritis: Secondary | ICD-10-CM | POA: Diagnosis not present

## 2023-08-25 ENCOUNTER — Other Ambulatory Visit: Admitting: Pharmacist

## 2023-08-25 DIAGNOSIS — I10 Essential (primary) hypertension: Secondary | ICD-10-CM

## 2023-08-25 DIAGNOSIS — I5032 Chronic diastolic (congestive) heart failure: Secondary | ICD-10-CM

## 2023-08-25 DIAGNOSIS — E1169 Type 2 diabetes mellitus with other specified complication: Secondary | ICD-10-CM

## 2023-08-25 DIAGNOSIS — Z7985 Long-term (current) use of injectable non-insulin antidiabetic drugs: Secondary | ICD-10-CM

## 2023-08-25 NOTE — Patient Instructions (Signed)
Goals Addressed             This Visit's Progress    Pharmacy Goals       Our goal A1c is less than 7%. This corresponds with fasting sugars less than 130 and 2 hour after meal sugars less than 180. Please keep a log of your results when checking your blood sugar  Our goal bad cholesterol, or LDL, is less than 70 . This is why it is important to continue taking your simvastatin  Please check your home blood pressure, keep a log of the results and bring this with you to your medical appointments.  Feel free to call me with any questions or concerns. I look forward to our next call!   Wallace Cullens, PharmD, Para March, CPP Clinical Pharmacist [facility] [phone]

## 2023-08-25 NOTE — Progress Notes (Signed)
 08/25/2023 Name: Diana Harmon MRN: [id number] DOB: 26-Jul-1945  Chief Complaint  Patient presents with   Medication Management   Medication Assistance   Medication Adherence    Rayya Yagi is a 78 y.o. year old female who presented for a telephone visit.   They were referred to the pharmacist by their PCP for assistance in managing diabetes, hypertension, hyperlipidemia, and medication access.      Subjective:   Care Team: Primary Care Provider: Raina Bunting, DO; Next Scheduled Visit: 12/16/2023 Rheumatologist: Melissa Spring, MD Hematologist: Gwyn Leos, MD Nurse Care Manager: Roxie Cord, RN; Next Schedule Visit: 09/19/2023 Cardiologist: Sammy Crisp, MD  Medication Access/Adherence  Current Pharmacy:  [facility] [address] ([address]), [place] - [address] [place]) [place] [address] Phone: [phone] Fax: [phone]  [facility] [address], [place] - [address] [place] [address] Phone: [phone] Fax: [phone]  [email or web address] - [place], [place] - [address] [facility] [address] [place] [address] Phone: [phone] Fax: [phone]  [facility] - [place], [place] - [address] [place] [address] Phone: [phone] Fax: [phone]  [id number] - [place], [place] - [address] AT [facility] [address] [place] [address] [place] [address] Phone: [phone] Fax: [phone]   Patient reports affordability concerns with their medications: No Patient reports access/transportation concerns to their pharmacy: No  Patient reports adherence concerns with their medications:  No      Diabetes:   Current medications:  - Trulicity  1.5 mg weekly   Reports tolerating well - Farxiga 10 mg daily    Medications tried in the  past: metformin    Reports recent morning blood sugar readings ranging 84-130  Reports has noticed improvement in appetite control with Trulicity  dose increase; current weight ~235 lbs   Patient denies hypoglycemic s/sx including dizziness, shakiness, sweating.    Current physical activity: Reports exercise limited by arthritis; working on gradually increasing her walking   Statin therapy: simvastatin  20 mg daily   Current medication access support:  - Enrolled in patient assistance for Trulicity  through [employer] through 05/05/2024 - Enrolled in patient assistance for Farxiga through [employer] through 05/05/2024      HTN/Chronic HFpEF :   Current medications:  ACEi/ARB/ARNI: losartan  50 mg daily SGLT2 inhibitor: Farxiga 10 mg daily (reports started ~1 month ago) Diuretic regimen: furosemide  40 mg - 1/2 (20 mg) daily as needed HCTZ 25 mg daily   Last checked blood pressure today, reading:  - Today: 114/53, HR 80 - 4/11: 130/53, HR 87   Reports monitoring home weight    Reports leg swelling controlled. Reports elevating legs during the day and wearing compression stockings helps   Current physical activity: Reports exercise limited by arthritis - walks using walker consistently   Current medication access support:  - Enrolled in patient assistance for Farxiga through [employer] through 05/05/2024   Objective:  Lab Results  Component Value Date   HGBA1C 6.5 (H) 06/10/2023    Lab Results  Component Value Date   CREATININE 1.39 (H) 07/10/2023   BUN 45 (H) 07/10/2023   NA 141 07/10/2023   K 4.3 07/10/2023   CL 104 07/10/2023   CO2 26 07/10/2023    Lab Results  Component Value Date   CHOL 162 06/10/2023   HDL 87 06/10/2023  LDLCALC 61 06/10/2023   TRIG 63 06/10/2023   CHOLHDL 1.9 06/10/2023    Medications Reviewed Today     Reviewed by Ardis Becton, RPH-CPP (Pharmacist) on 08/25/23 at 1045  Med List Status: <None>   Medication Order Taking? Sig Documenting  Provider Last Dose Status Informant  ACCU-CHEK GUIDE test strip [id number]  USE TO CHECK BLOOD SUGAR TWICE DAILY Raina Bunting, DO  Active   Accu-Chek Softclix Lancets lancets [id number]  USE TO CHECK BLOOD GLUCOSE TWICE DAILY Romeo Co, Kayleen Party, DO  Active   acetaminophen  (TYLENOL ) 500 MG tablet [id number]  Take 1,000 mg by mouth every 6 (six) hours as needed for moderate pain.  [provider]  Active Child  Artificial Tear Solution (GENTEAL TEARS) 0.1-0.2-0.3 % SOLN [id number]  Place 1 drop into both eyes 3 (three) times daily as needed (dry/irritated eyes.). [provider]  Active Child  aspirin  81 MG chewable tablet [id number]  Chew 1 tablet (81 mg total) by mouth daily. Kristene Petrin  Active Child  B COMPLEX-C PO [id number]  Take 10 mLs by mouth daily. [provider]  Active Child  baclofen  (LIORESAL ) 10 MG tablet [id number]  TAKE 1 TO 2 TABLETS BY MOUTH TWICE DAILY AS NEEDED FOR MUSCLE SPASM Romeo Co, Kayleen Party, DO  Active   Blood Glucose Monitoring Suppl (ACCU-CHEK GUIDE) w/Device KIT [id number]  Use to check blood sugar twice per day Raina Bunting, DO  Active   Cholecalciferol (VITAMIN D3) 125 MCG (5000 UT) TABS [id number]  Take 5,000 Units by mouth daily. [provider]  Active Child    Discontinued 09/05/19 1316   cyanocobalamin  1000 MCG tablet [id number]  Take by mouth every other day. [provider]  Active            Med Note Alla Isaacs, [facility]   Mon Jan 13, 2023 10:15 AM)    dapagliflozin propanediol (FARXIGA) 10 MG TABS tablet [id number] Yes Take 10 mg by mouth daily. [provider] Taking Active   Ferrous Sulfate  (IRON ) 325 (65 Fe) MG TABS [id number]  TAKE 1 TABLET BY MOUTH TWICE DAILY WITH A MEAL [name], Kayleen Party, DO  Active Child  furosemide  (LASIX ) 40 MG tablet [id number] Yes Take 1 tablet (40 mg total) by mouth daily.  Patient taking differently: Take 20 mg by mouth daily as  needed.   Raina Bunting, DO Taking Active   hydrochlorothiazide  (HYDRODIURIL ) 25 MG tablet [id number] Yes Take 1 tablet (25 mg total) by mouth daily. Raina Bunting, DO Taking Active   hydroxychloroquine (PLAQUENIL) 200 MG tablet [id number] Yes Take 1 tablet by mouth 2 (two) times daily. [provider] Taking Active   lactobacillus acidophilus (BACID) TABS tablet [id number]  Take 1 tablet by mouth daily as needed (digestive health.). [provider]  Active            Med Note Kolleen Perone   Wed Nov 03, 2019  3:06 PM)    Lancets Misc. KIT [id number]  Use to check blood sugar twice daily [provider]  Active   levocetirizine (XYZAL ) 5 MG tablet [id number]  TAKE 1 TABLET BY MOUTH ONCE DAILY IN THE EVENING  Patient taking differently: Take 1/2 tablet (2.5 mg) every other day in the evening as needed for allergies   Raina Bunting, DO  Active   losartan  (COZAAR ) 50 MG tablet [id number] Yes Take 1 tablet (50 mg total) by mouth daily. Raina Bunting, DO Taking Active  magnesium  oxide (MAG-OX) 400 MG tablet [id number]  Take 400 mg by mouth daily. [provider]  Active   mupirocin  ointment (BACTROBAN ) 2 % [id number]  Apply 1 Application topically 2 (two) times daily.  Patient taking differently: Apply 1 Application topically 2 (two) times daily as needed.   Raina Bunting, DO  Active   simvastatin  (ZOCOR ) 20 MG tablet [id number] Yes TAKE 1 TABLET BY MOUTH ONCE DAILY AT  Brigida Canal, Kayleen Party, DO Taking Active   TRULICITY  1.5 MG/0.5ML Stevens Eland [id number] Yes Inject 1.5 mg into the skin once a week. Raina Bunting, DO Taking Active               Assessment/Plan:   Diabetes: - Currently controlled - Reviewed long term cardiovascular and renal outcomes of uncontrolled blood sugar - Encouraged patient to have regular well-balanced meals throughout the day, while controlling carbohydrate portion  sizes - Encouraged patient to continue to use blood sugar checks as feedback on dietary choices - Encouraged patient to continue to monitor home blood sugar, keep log and bring record with her to medical appointments - Patient to follow up with [employer] as needed for refills of Trulicity  and with [employer] as needed for refills of Farxiga     HTN/Chronic HFpEF : - Reviewed appropriate blood pressure monitoring technique and reviewed goal blood pressure - Have reviewed to weigh daily and when to contact PCP/cardiology with weight gain     Follow Up Plan: Clinical Pharmacist will follow up with patient by telephone on 11/17/2023 at 10:30 AM    Arthur Lash, PharmD, [facility] [phone]

## 2023-09-19 ENCOUNTER — Other Ambulatory Visit: Payer: Self-pay

## 2023-09-19 NOTE — Patient Instructions (Addendum)
 Thank you for allowing the Complex Care Management team to participate in your care. It was great speaking with you today!  Reminders:  -Your PCP is aware of the diastolic (bottom number) pressures. You are not required to adjust or change medications at this time since you have been asymptomatic. Please continue to consistently monitor your blood pressure twice a day and record readings. -Please seek medication attention if you develop symptoms and your blood pressures are low.  We will follow up on October 14, 2023 at 1 pm. Please do not hesitate to contact me if you require assistance prior to our next outreach.    Roxie Cord [facility] Population [facility] RN Care Manager Direct Dial: [phone]  Fax: [phone] Website: [email or web address]

## 2023-09-19 NOTE — Patient Outreach (Signed)
 Complex Care Management   Visit Note  09/19/2023  Name:  Diana Harmon MRN: [id number] DOB: Nov 18, 1945  Situation: Referral received for Complex Care Management related to Heart Failure, DM and Hypertension. I obtained verbal consent from Patient.  Visit completed with Diana Harmon via telephone.  Background:   Past Medical History:  Diagnosis Date   Anemia    Arthritis    legs, knees, back, wrists   Diabetes mellitus without complication (HCC)    Hypertension    Nausea    Patient is [name]    S/P TAVR (transcatheter aortic valve replacement) 11/09/2019   s/p TAVR with a 26 mm Edwards S3U via the TF approach by Dr. Abel Hoe & Bartle   Severe aortic stenosis    SIRS (systemic inflammatory response syndrome) (HCC) 11/2019   Sjogren's disease (HCC)    Thyroid  disease      Assessment: Patient Reported Symptoms: Cognitive Cognitive Status: Alert and oriented to person, place, and time, Normal speech and language skills Cognitive/Intellectual Conditions Management [RPT]: None reported or documented in medical history or problem list   Health Maintenance Behaviors: Annual physical exam, Hobbies, Social activities, Spiritual practice(s) Healing Pattern: Average Health Facilitated by: Healthy diet, Prayer/meditation, Rest  Neurological Neurological Review of Symptoms: No symptoms reported  HEENT HEENT Symptoms Reported: No symptoms reported HEENT Conditions: Ear problem(s) (Meniere's Disease (Right Ear), Sjogren's Disease) HEENT Management Strategies: Coping strategies, Medication therapy, Routine screening HEENT Self-Management Outcome: 4 (good) Ear problem(s) (Meniere's Disease (Right Ear), Sjogren's Disease)  Cardiovascular Cardiovascular Symptoms Reported: No symptoms reported Does patient have uncontrolled Hypertension?: No Cardiovascular Conditions: Heart failure, Coronary artery disease, Dysrhythmia, High blood cholesterol, Hypertension, Valvular  disease Cardiovascular Management Strategies: Coping strategies, Diet modification, Medical device, Medication therapy, Fluid modification, Routine screening, Weight management Do You Have a Working Readable Scale?: Yes Weight: 235 lb (106.6 kg) Cardiovascular Self-Management Outcome: 4 (good)  Respiratory Respiratory Symptoms Reported: Dry cough Respiratory Conditions: Seasonal allergies Respiratory Self-Management Outcome: 4 (good)  Endocrine Patient reports the following symptoms related to hypoglycemia or hyperglycemia : No symptoms reported Is patient diabetic?: Yes Is patient checking blood sugars at home?: Yes Endocrine Conditions: Diabetes, Thyroid  disorder, Vitamin D  deficiency Endocrine Management Strategies: Diet modification, Coping strategies, Medical device, Medication therapy, Routine screening Endocrine Self-Management Outcome: 4 (good)  Gastrointestinal Gastrointestinal Symptoms Reported: No symptoms reported Gastrointestinal Conditions: Reflux/heartburn Gastrointestinal Management Strategies: Medication therapy Gastrointestinal Self-Management Outcome: 4 (good) Nutrition Risk Screen (CP): No indicators present  Genitourinary Genitourinary Symptoms Reported: No symptoms reported  Integumentary Integumentary Symptoms Reported: No symptoms reported Skin Conditions: Eczema Skin Management Strategies: Routine screening (Topical creams and lotion) Skin Self-Management Outcome: 5 (very good)  Musculoskeletal Musculoskelatal Symptoms Reviewed: No symptoms reported Musculoskeletal Conditions: Osteoarthritis, Rheumatoid arthritis Musculoskeletal Management Strategies: Medical device, Routine screening, Coping strategies, Adequate rest, Medication therapy Musculoskeletal Self-Management Outcome: 4 (good) Falls in the past year?: No Number of falls in past year: 1 or less Was there an injury with Fall?: No Fall Risk Category Calculator: 0 Patient Fall Risk Level: Low Fall  Risk Patient at Risk for Falls Due to: Impaired balance/gait, Medication side effect, Other (Comment) (Experiences episodes of dizziness which increases risks for falls.)  Psychosocial Quality of Family Relationships: supportive Do you feel physically threatened by others?: No      08/11/2023   11:07 PM  Depression screen PHQ 2/9  Decreased Interest 0  Down, Depressed, Hopeless 0  PHQ - 2 Score 0    Vitals:   09/19/23 1315  BP: Aaron Aas)  115/44    Medications Reviewed Today     Reviewed by Roxie Cord, RN (Registered Nurse) on 09/19/23 at 1309  Med List Status: <None>   Medication Order Taking? Sig Documenting Provider Last Dose Status Informant  ACCU-CHEK GUIDE test strip [id number] No USE TO CHECK BLOOD SUGAR TWICE DAILY Raina Bunting, DO Taking Active   Accu-Chek Softclix Lancets lancets [id number] No USE TO CHECK BLOOD GLUCOSE TWICE DAILY Romeo Co Kayleen Party, DO Taking Active   acetaminophen  (TYLENOL ) 500 MG tablet [id number] No Take 1,000 mg by mouth every 6 (six) hours as needed for moderate pain.  [provider] Taking Active Child  Artificial Tear Solution (GENTEAL TEARS) 0.1-0.2-0.3 % SOLN [id number] No Place 1 drop into both eyes 3 (three) times daily as needed (dry/irritated eyes.). [provider] Taking Active Child  aspirin  81 MG chewable tablet [id number] No Chew 1 tablet (81 mg total) by mouth daily. Ardia Kraft, PA-C Taking Active Child  B COMPLEX-C PO [id number] No Take 10 mLs by mouth daily. [provider] Taking Active Child  baclofen  (LIORESAL ) 10 MG tablet [id number] No TAKE 1 TO 2 TABLETS BY MOUTH TWICE DAILY AS NEEDED FOR MUSCLE SPASM Romeo Co, Kayleen Party, DO Taking Active   Blood Glucose Monitoring Suppl (ACCU-CHEK GUIDE) w/Device KIT [id number] No Use to check blood sugar twice per day Raina Bunting, DO Taking Active   Cholecalciferol (VITAMIN D3) 125 MCG (5000 UT) TABS [id number] No Take 5,000 Units by  mouth daily. [provider] Taking Active Child  Discontinued 09/05/19 1316   cyanocobalamin  1000 MCG tablet [id number] No Take by mouth every other day. [provider] Taking Active            Med Note Alla Isaacs, [facility]   Mon Jan 13, 2023 10:15 AM)    dapagliflozin propanediol (FARXIGA) 10 MG TABS tablet [id number] No Take 10 mg by mouth daily. [provider] Taking Active   Ferrous Sulfate  (IRON ) 325 (65 Fe) MG TABS [id number] No TAKE 1 TABLET BY MOUTH TWICE DAILY WITH A MEAL [name], Kayleen Party, DO Taking Active Child  furosemide  (LASIX ) 40 MG tablet [id number] No Take 1 tablet (40 mg total) by mouth daily.  Patient taking differently: Take 20 mg by mouth daily as needed.   Raina Bunting, DO Taking Active   hydrochlorothiazide  (HYDRODIURIL ) 25 MG tablet [id number] No Take 1 tablet (25 mg total) by mouth daily. Raina Bunting, DO Taking Active   hydroxychloroquine (PLAQUENIL) 200 MG tablet [id number] No Take 1 tablet by mouth 2 (two) times daily. [provider] Taking Active   lactobacillus acidophilus (BACID) TABS tablet [id number] No Take 1 tablet by mouth daily as needed (digestive health.). [provider] Taking Active            Med Note Kolleen Perone   Wed Nov 03, 2019  3:06 PM)    Lancets Misc. KIT [id number] No Use to check blood sugar twice daily [provider] Taking Active   levocetirizine (XYZAL ) 5 MG tablet [id number] No TAKE 1 TABLET BY MOUTH ONCE DAILY IN THE EVENING  Patient taking differently: Take 1/2 tablet (2.5 mg) every other day in the evening as needed for allergies   Raina Bunting, DO Taking Active   losartan  (COZAAR ) 50 MG tablet [id number] No Take 1 tablet (50 mg total) by mouth daily. Raina Bunting, DO Taking Active   magnesium  oxide (MAG-OX) 400 MG tablet [id number] No Take 400 mg by mouth daily.  [provider] Taking Active   mupirocin  ointment  (BACTROBAN ) 2 % [id number] No Apply 1 Application topically 2 (two) times daily.  Patient taking differently: Apply 1 Application topically 2 (two) times daily as needed.   Raina Bunting, DO Taking Active   simvastatin  (ZOCOR ) 20 MG tablet [id number] No TAKE 1 TABLET BY MOUTH ONCE DAILY AT  Brigida Canal, Kayleen Party, DO Taking Active   TRULICITY  1.5 MG/0.5ML Stevens Eland [id number] No Inject 1.5 mg into the skin once a week. Raina Bunting, DO Taking Active             Recommendation:   Complete medical appointments as scheduled  Follow Up Plan:   Telephone follow up appointment with Nurse Case Manager on October 14, 2023.   Roxie Cord [facility] RN Care Manager Direct Dial: [phone]  Fax: [phone] Website: [email or web address]

## 2023-09-25 ENCOUNTER — Ambulatory Visit: Payer: Self-pay

## 2023-09-25 ENCOUNTER — Other Ambulatory Visit: Payer: Self-pay | Admitting: Family Medicine

## 2023-09-25 DIAGNOSIS — J069 Acute upper respiratory infection, unspecified: Secondary | ICD-10-CM

## 2023-09-25 MED ORDER — BENZONATATE 200 MG PO CAPS: 200.0000 mg | ORAL_CAPSULE | Freq: Two times a day (BID) | ORAL | 1 refills | Status: DC | PRN

## 2023-09-25 NOTE — Telephone Encounter (Signed)
 Called patient back. I agreed to order the Tessalon  Benzonatate  - I sent 200mg  dose, up to twice a day as needed she uses at night mostly. She has no fever, seems improved just cough at night. I advised her since I don't have an apt today and not here tomorrow. This is a mild respiratory illness it sounds like, to avoid her from avoid to ED or UC on holiday weekend, I agree to order this. She understands if worsening symptoms or new concerns to call or return or seek care over weekend.  Domingo Friend, DO [facility] 09/25/2023, 12:07 PM

## 2023-09-25 NOTE — Telephone Encounter (Signed)
 Chief Complaint: cough Frequency:  Pertinent Negatives: Patient denies fever, SOB, nausea, vomiting, diarrhea, CP Disposition: [] ED /[x] Urgent Care (no appt availability in office) / [] Appointment(In office/virtual)/ []  [facility]/ [] Home Care/ [x] Refused Recommended Disposition /[] [facility]/ []  Follow-up with PCP Additional Notes: Pt reports cough. Pt reports last Monday she developed H/A, sore throat, stuffy nose, and cough. Pt states the cough is still lingering and is interrupting with sleep. Pt reports clear sputum. Pt reports some chest pain last night with coughing and sneezing, but none now. Denies difficulty breathing. RN advised pt she should be seen within 24 hours and advised an UC. Pt's daughter stated she is working today and would be unable to take the pt to an UC. Pt and daughter would like to be prescribed cough medication, benzonatate . It has been prescribed to the pt in the past. RN advised pt RN would relay request to the provider. RN advised pt if she develops CP or SOB, to go to the ED. Pt verbalized understanding.   Copied from CRM [id number]. Topic: Clinical - Prescription Issue >> Sep 25, 2023 10:05 AM Stanly Early wrote: Reason for CRM: patient is experiencing coughing, she stated MD [facility] prescribed her benzonatate  (TESSALON ) 100 MG capsule [[id number]]  DISCONTINUED. The medication is now discontinued and would like something else to be put in to the [facility] in [place] Reason for Disposition  SEVERE coughing spells (e.g., whooping sound after coughing, vomiting after coughing)  Answer Assessment - Initial Assessment Questions 1. ONSET: "When did the cough begin?"      "I started feeling bad last Monday, Wednesday my throat was sore, headache, I got stuffy, mostly really everything cleared up but this cough is lingering" 2. SEVERITY: "How bad is the cough today?"      Coughing frequently during the day, bringing up mucus, at night it's more severe,  it's really interrupting my sleeping at night" 3. SPUTUM: "Describe the color of your sputum" (none, dry cough; clear, white, yellow, green)     "I do have phlegm, but it's clear" 4. HEMOPTYSIS: "Are you coughing up any blood?" If so ask: "How much?" (flecks, streaks, tablespoons, etc.)     No  5. DIFFICULTY BREATHING: "Are you having difficulty breathing?" If Yes, ask: "How bad is it?" (e.g., mild, moderate, severe)    - MILD: No SOB at rest, mild SOB with walking, speaks normally in sentences, can lie down, no retractions, pulse < 100.    - MODERATE: SOB at rest, SOB with minimal exertion and prefers to sit, cannot lie down flat, speaks in phrases, mild retractions, audible wheezing, pulse 100-120.    - SEVERE: Very SOB at rest, speaks in single words, struggling to breathe, sitting hunched forward, retractions, pulse > 120      No  6. FEVER: "Do you have a fever?" If Yes, ask: "What is your temperature, how was it measured, and when did it start?"     No  7. CARDIAC HISTORY: "Do you have any history of heart disease?" (e.g., heart attack, congestive heart failure)      Valve replacement, HTN, HF w/ preserved EF 8. LUNG HISTORY: "Do you have any history of lung disease?"  (e.g., pulmonary embolus, asthma, emphysema)     No  9. PE RISK FACTORS: "Do you have a history of blood clots?" (or: recent major surgery, recent prolonged travel, bedridden)     No  10. OTHER SYMPTOMS: "Do you have any other symptoms?" (e.g., runny nose,  wheezing, chest pain)       Other symptoms have subsided, cough is lingering. Pt states "when I cough so hard, it hurt my chest a little bit." Denies CP with coughing currently. "Last night I was coughing and sneezing at the same time and that pulled a little bit on my chest but it doesn't hurt now"  Protocols used: Cough - Acute Productive-A-AH

## 2023-10-03 ENCOUNTER — Ambulatory Visit
Admission: EM | Admit: 2023-10-03 | Discharge: 2023-10-03 | Disposition: A | Discharge status: Home / Self Care | Attending: Emergency Medicine | Admitting: Emergency Medicine

## 2023-10-03 ENCOUNTER — Encounter: Payer: Self-pay | Admitting: Emergency Medicine

## 2023-10-03 DIAGNOSIS — J069 Acute upper respiratory infection, unspecified: Secondary | ICD-10-CM | POA: Diagnosis not present

## 2023-10-03 LAB — GROUP A STREP BY PCR: Group A Strep by PCR: NOT DETECTED

## 2023-10-03 MED ORDER — BENZONATATE 100 MG PO CAPS: 200.0000 mg | ORAL_CAPSULE | Freq: Three times a day (TID) | ORAL | 0 refills | Status: DC

## 2023-10-03 MED ORDER — PROMETHAZINE-DM 6.25-15 MG/5ML PO SYRP: 5.0000 mL | ORAL_SOLUTION | Freq: Four times a day (QID) | ORAL | 0 refills | Status: DC | PRN

## 2023-10-03 MED ORDER — IPRATROPIUM BROMIDE 0.06 % NA SOLN: 2.0000 | Freq: Four times a day (QID) | NASAL | 12 refills | Status: DC

## 2023-10-03 MED ORDER — DOXYCYCLINE HYCLATE 100 MG PO CAPS: 100.0000 mg | ORAL_CAPSULE | Freq: Two times a day (BID) | ORAL | 0 refills | Status: AC

## 2023-10-03 NOTE — Discharge Instructions (Addendum)
 Take the doxycycline twice daily with food for 7 days for treatment of your upper respiratory tract infection.  You may continue to use over-the-counter Tylenol  and/or ibuprofen as needed for any fever or pain.  Use the Atrovent nasal spray, 2 squirts in each nostril every 6 hours, as needed for runny nose and postnasal drip.  Use the Tessalon  Perles every 8 hours during the day.  Take them with a small sip of water.  They may give you some numbness to the base of your tongue or a metallic taste in your mouth, this is normal.  Use the Promethazine  DM cough syrup at bedtime for cough and congestion.  It will make you drowsy so do not take it during the day.  Return for reevaluation or see your primary care provider for any new or worsening symptoms.

## 2023-10-03 NOTE — ED Triage Notes (Addendum)
 Patent presents with c/o sore throat, productive cough and nasal congestion x 1 week. Patient is also c/o gum discomfort and states she is using salt water rinses. Patient states she is taking tessalon  Perles for the cough.

## 2023-10-03 NOTE — ED Provider Notes (Signed)
 [facility]    CSN: [id number] Arrival date & time: 10/03/23  0865      History   Chief Complaint Chief Complaint  Patient presents with   Nasal Congestion   Cough   Sore Throat    HPI Diana Harmon is a 78 y.o. female.   HPI  78 year old female with past medical history significant for diabetes, thyroid  disease, hypertension, arthritis, SIRS, and Sjogren syndrome presents for evaluation of respiratory symptoms that began a week ago which include nasal congestion with clear nasal discharge, pain in her upper gums and in her left maxillary sinus, sore throat, and productive cough for clear sputum.  She denies any fever, ear pain, shortness breath, or wheezing.  Past Medical History:  Diagnosis Date   Anemia    Arthritis    legs, knees, back, wrists   Diabetes mellitus without complication (HCC)    Hypertension    Nausea    Patient is [name]    S/P TAVR (transcatheter aortic valve replacement) 11/09/2019   s/p TAVR with a 26 mm Edwards S3U via the TF approach by Dr. Abel Hoe & Bartle   Severe aortic stenosis    SIRS (systemic inflammatory response syndrome) (HCC) 11/2019   Sjogren's disease (HCC)    Thyroid  disease     Patient Active Problem List   Diagnosis Date Noted   Rheumatoid arthritis of multiple sites with negative rheumatoid factor (HCC) 03/06/2022   Protrusion of intervertebral disc of thoracic region 02/19/2021   Sjogren's syndrome with inflammatory arthritis (HCC) 02/19/2021   S/P TAVR (transcatheter aortic valve replacement) 11/09/2019   Patient is [name]    Severe aortic stenosis    Thyroid  disease    Chronic heart failure with preserved ejection fraction (HFpEF) (HCC) 09/30/2019   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/22/2019   Arthralgia 04/22/2019   Normocytic anemia 02/17/2019   Primary osteoarthritis of right knee 02/06/2018   Morbid obesity (HCC) 11/05/2017   Neutropenia (HCC)  04/24/2017   Vitamin D  deficiency 10/08/2016   Meniere's disease of right ear 07/04/2016   TIA (transient ischemic attack) 07/04/2016   Nonrheumatic aortic valve stenosis 07/04/2016   Seasonal allergies 01/30/2016   Recurrent UTI 10/26/2015   Iron  deficiency anemia due to chronic blood loss 02/28/2015   History of adenomatous polyp of colon 02/13/2015   Osteoarthritis of both knees 12/05/2014   Acid reflux 12/05/2014   Benign neoplasm of colon 12/05/2014   Type 2 diabetes mellitus with other specified complication (HCC) 12/05/2014   Essential hypertension 12/05/2014   Hyperlipidemia associated with type 2 diabetes mellitus (HCC) 12/05/2014   Arthritis, degenerative 12/05/2014   Anemia due to multiple mechanisms 12/05/2014   Abnormal liver enzymes 12/05/2014   Anemia 12/05/2014   Morbid (severe) obesity due to excess calories (HCC) 12/05/2014    Past Surgical History:  Procedure Laterality Date   CARDIAC CATHETERIZATION  10/19/2019   COLON SURGERY     caudarized polyps; rectal carcinoid excision ~ 2011   COLONOSCOPY     COLONOSCOPY WITH PROPOFOL  N/A 03/13/2015   Procedure: COLONOSCOPY WITH PROPOFOL ;  Surgeon: Cassie Click, MD;  Location: [facility] ENDOSCOPY;  Service: Endoscopy;  Laterality: N/A;   ESOPHAGOGASTRODUODENOSCOPY N/A 03/13/2015   Procedure: ESOPHAGOGASTRODUODENOSCOPY (EGD);  Surgeon: Cassie Click, MD;  Location: [facility] ENDOSCOPY;  Service: Endoscopy;  Laterality: N/A;   INTRAOPERATIVE TRANSTHORACIC ECHOCARDIOGRAM N/A 11/09/2019   Procedure: INTRAOPERATIVE TRANSTHORACIC ECHOCARDIOGRAM;  Surgeon: Odie Benne, MD;  Location: [facility] OR;  Service: Open Heart Surgery;  Laterality: N/A;   PARATHYROIDECTOMY  11/20/2004   right superior parathyroidecomy for primary hyperparathyroidism/parathyroid adenoma   RIGHT/LEFT HEART CATH AND CORONARY ANGIOGRAPHY N/A 10/19/2019   Procedure: RIGHT/LEFT HEART CATH AND CORONARY ANGIOGRAPHY;  Surgeon: Sammy Crisp, MD;  Location: [facility]  INVASIVE CV LAB;  Service: Cardiovascular;  Laterality: N/A;   THYROIDECTOMY     TRANSCATHETER AORTIC VALVE REPLACEMENT, TRANSFEMORAL N/A 11/09/2019   Procedure: TRANSCATHETER AORTIC VALVE REPLACEMENT, TRANSFEMORAL;  Surgeon: Odie Benne, MD;  Location: [facility] OR;  Service: Open Heart Surgery;  Laterality: N/A;    OB History   No obstetric history on file.      Home Medications    Prior to Admission medications   Medication Sig Start Date End Date Taking? Authorizing Provider  benzonatate  (TESSALON ) 100 MG capsule Take 2 capsules (200 mg total) by mouth every 8 (eight) hours. 10/03/23  Yes Kent Pear, NP  doxycycline (VIBRAMYCIN) 100 MG capsule Take 1 capsule (100 mg total) by mouth 2 (two) times daily for 7 days. 10/03/23 10/10/23 Yes Kent Pear, NP  ipratropium (ATROVENT) 0.06 % nasal spray Place 2 sprays into both nostrils 4 (four) times daily. 10/03/23  Yes Kent Pear, NP  promethazine -dextromethorphan (PROMETHAZINE -DM) 6.25-15 MG/5ML syrup Take 5 mLs by mouth 4 (four) times daily as needed. 10/03/23  Yes Kent Pear, NP  ACCU-CHEK GUIDE test strip USE TO CHECK BLOOD SUGAR TWICE DAILY 08/20/22   Raina Bunting, DO  Accu-Chek Softclix Lancets lancets USE TO CHECK BLOOD GLUCOSE TWICE DAILY 08/20/22   Romeo Co, Kayleen Party, DO  acetaminophen  (TYLENOL ) 500 MG tablet Take 1,000 mg by mouth every 6 (six) hours as needed for moderate pain.     [provider]  Artificial Tear Solution (GENTEAL TEARS) 0.1-0.2-0.3 % SOLN Place 1 drop into both eyes 3 (three) times daily as needed (dry/irritated eyes.).    [provider]  aspirin  81 MG chewable tablet Chew 1 tablet (81 mg total) by mouth daily. 11/11/19   Ardia Kraft, PA-C  B COMPLEX-C PO Take 10 mLs by mouth daily.    [provider]  baclofen  (LIORESAL ) 10 MG tablet TAKE 1 TO 2 TABLETS BY MOUTH TWICE DAILY AS NEEDED FOR MUSCLE SPASM 06/17/23   Romeo Co, Kayleen Party, DO  Blood Glucose  Monitoring Suppl (ACCU-CHEK GUIDE) w/Device KIT Use to check blood sugar twice per day 02/05/22   Raina Bunting, DO  Cholecalciferol (VITAMIN D3) 125 MCG (5000 UT) TABS Take 5,000 Units by mouth daily.    [provider]  cyanocobalamin  1000 MCG tablet Take by mouth every other day.    [provider]  dapagliflozin propanediol (FARXIGA) 10 MG TABS tablet Take 10 mg by mouth daily.    [provider]  Ferrous Sulfate  (IRON ) 325 (65 Fe) MG TABS TAKE 1 TABLET BY MOUTH TWICE DAILY WITH A MEAL 06/05/18   [name], Kayleen Party, DO  furosemide  (LASIX ) 40 MG tablet Take 1 tablet (40 mg total) by mouth daily. Patient taking differently: Take 20 mg by mouth daily as needed. 11/06/22   [name], Kayleen Party, DO  hydrochlorothiazide  (HYDRODIURIL ) 25 MG tablet Take 1 tablet (25 mg total) by mouth daily. 06/17/23   [name], Kayleen Party, DO  hydroxychloroquine (PLAQUENIL) 200 MG tablet Take 1 tablet by mouth 2 (two) times daily. 02/19/21   [provider]  lactobacillus acidophilus (BACID) TABS tablet Take 1 tablet by mouth daily as needed (digestive health.).    [provider]  Lancets Misc. KIT Use to check  blood sugar twice daily 11/18/19   [provider]  levocetirizine (XYZAL ) 5 MG tablet TAKE 1 TABLET BY MOUTH ONCE DAILY IN THE EVENING Patient taking differently: Take 1/2 tablet (2.5 mg) every other day in the evening as needed for allergies 12/28/19   Raina Bunting, DO  losartan  (COZAAR ) 50 MG tablet Take 1 tablet (50 mg total) by mouth daily. 06/17/23   [name], Kayleen Party, DO  magnesium  oxide (MAG-OX) 400 MG tablet Take 400 mg by mouth daily.    [provider]  mupirocin  ointment (BACTROBAN ) 2 % Apply 1 Application topically 2 (two) times daily. Patient taking differently: Apply 1 Application topically 2 (two) times daily as needed. 10/29/21   [name], Kayleen Party, DO  simvastatin  (ZOCOR ) 20 MG tablet TAKE 1  TABLET BY MOUTH ONCE DAILY AT  6PM 06/17/23   [name], Kayleen Party, DO  TRULICITY  1.5 MG/0.5ML SOAJ Inject 1.5 mg into the skin once a week. 06/18/23   [name], Kayleen Party, DO  Cromolyn Sodium (NASAL ALLERGY NA) Place 1 spray into the nose daily as needed (allergies).  09/05/19  [provider]    Family History Family History  Problem Relation Age of Onset   Parkinson's disease Mother    Heart disease Father    Lupus Sister    Aneurysm Son    Lupus Son    Breast cancer Maternal Aunt        mat great aunt    Social History Social History   Tobacco Use   Smoking status: Former    Current packs/day: 0.00    Types: Cigarettes    Start date: 09/05/1959    Quit date: 09/05/1979    Years since quitting: 44.1   Smokeless tobacco: Former  Building services engineer status: Never Used  Substance Use Topics   Alcohol  use: Not Currently   Drug use: No     Allergies   Ferumoxytol , Penicillins, Iron , and Other   Review of Systems Review of Systems  Constitutional:  Negative for fever.  HENT:  Positive for congestion, rhinorrhea, sinus pain and sore throat. Negative for ear pain.   Respiratory:  Positive for cough. Negative for shortness of breath and wheezing.      Physical Exam Triage Vital Signs ED Triage Vitals  Encounter Vitals Group     BP      Systolic BP Percentile      Diastolic BP Percentile      Pulse      Resp      Temp      Temp src      SpO2      Weight      Height      Head Circumference      Peak Flow      Pain Score      Pain Loc      Pain Education      Exclude from Growth Chart    No data found.  Updated Vital Signs BP (!) 139/58 (BP Location: Left Arm)   Pulse 77   Temp 98.5 F (36.9 C) (Oral)   Resp 18   SpO2 96%   Visual Acuity Right Eye Distance:   Left Eye Distance:   Bilateral Distance:    Right Eye Near:   Left Eye Near:    Bilateral Near:     Physical Exam Vitals and nursing note reviewed.  Constitutional:       Appearance: Normal appearance. She is not ill-appearing.  HENT:     Head: Normocephalic and atraumatic.     Right Ear: Tympanic membrane, ear canal and external ear normal. There is no impacted cerumen.     Left Ear: Tympanic membrane, ear canal and external ear normal. There is no impacted cerumen.     Nose: Congestion and rhinorrhea present.     Comments: Nasal mucosa is edematous and erythematous with clear discharge in both nares.    Mouth/Throat:     Mouth: Mucous membranes are moist.     Pharynx: Oropharynx is clear. Posterior oropharyngeal erythema present. No oropharyngeal exudate.     Comments: Moderate Thieme into the posterior oropharynx.  No tonsillar hypertrophy or exudate noted. Cardiovascular:     Rate and Rhythm: Normal rate and regular rhythm.     Pulses: Normal pulses.     Heart sounds: Normal heart sounds. No murmur heard.    No friction rub. No gallop.  Pulmonary:     Effort: Pulmonary effort is normal.     Breath sounds: Normal breath sounds. No wheezing, rhonchi or rales.  Musculoskeletal:     Cervical back: Normal range of motion and neck supple. No tenderness.  Lymphadenopathy:     Cervical: No cervical adenopathy.  Skin:    General: Skin is warm and dry.     Capillary Refill: Capillary refill takes less than 2 seconds.     Findings: No rash.  Neurological:     General: No focal deficit present.     Mental Status: She is alert and oriented to person, place, and time.      UC Treatments / Results  Labs (all labs ordered are listed, but only abnormal results are displayed) Labs Reviewed  GROUP A STREP BY PCR    EKG   Radiology No results found.  Procedures Procedures (including critical care time)  Medications Ordered in UC Medications - No data to display  Initial Impression / Assessment and Plan / UC Course  I have reviewed the triage vital signs and the nursing notes.  Pertinent labs & imaging results that were available during my care  of the patient were reviewed by me and considered in my medical decision making (see chart for details).   Patient is a pleasant, nontoxic-appearing 78 year old female presenting for evaluation of 1 week with respiratory symptoms as outlined in HPI above.  Her most significant symptom is pain in her upper abdominals and she states that it is impeding her ability to wear her upper old dental plate.  She also has tenderness in her maxillary sinus that is made worse when she rinses with saline.  Additionally, she is complaining of a sore throat and a productive cough for clear sputum.  She is able to speak in full sentence with dyspnea or tachypnea.  Lung sounds are clear to auscultation all fields.  Differential diagnose include COVID, influenza, strep pharyngitis.  I will order a strep PCR.  Given that her symptoms have been going on for a week I will not test for COVID or influenza at this time.  I suspect that her sputum production is being driven by postnasal drip rather than organic lung infection.  PCR is negative.  Will discharge patient home with a diagnosis of URI with cough and congestion.  Given that she has had symptoms for over a week I do feel a trial of antibiotics is warranted.  She has a hive allergy to penicillins so I will also avoid cephalosporins.  I will discharge her home  on doxycycline 100 mg twice daily with food for a week.  I will prescribe Atrovent nasal spray double the nasal congestion.  Tessalon  Perles and Promethazine  DM cough syrup for cough and congestion.  Return precautions reviewed.   Final Clinical Impressions(s) / UC Diagnoses   Final diagnoses:  URI with cough and congestion     Discharge Instructions      Take the doxycycline twice daily with food for 7 days for treatment of your upper respiratory tract infection.  You may continue to use over-the-counter Tylenol  and/or ibuprofen as needed for any fever or pain.  Use the Atrovent nasal spray, 2 squirts in  each nostril every 6 hours, as needed for runny nose and postnasal drip.  Use the Tessalon  Perles every 8 hours during the day.  Take them with a small sip of water.  They may give you some numbness to the base of your tongue or a metallic taste in your mouth, this is normal.  Use the Promethazine  DM cough syrup at bedtime for cough and congestion.  It will make you drowsy so do not take it during the day.  Return for reevaluation or see your primary care provider for any new or worsening symptoms.    ED Prescriptions     Medication Sig Dispense Auth. Provider   benzonatate  (TESSALON ) 100 MG capsule Take 2 capsules (200 mg total) by mouth every 8 (eight) hours. 21 capsule Kent Pear, NP   doxycycline (VIBRAMYCIN) 100 MG capsule Take 1 capsule (100 mg total) by mouth 2 (two) times daily for 7 days. 14 capsule Kent Pear, NP   ipratropium (ATROVENT) 0.06 % nasal spray Place 2 sprays into both nostrils 4 (four) times daily. 15 mL Kent Pear, NP   promethazine -dextromethorphan (PROMETHAZINE -DM) 6.25-15 MG/5ML syrup Take 5 mLs by mouth 4 (four) times daily as needed. 118 mL Kent Pear, NP      PDMP not reviewed this encounter.   Kent Pear, NP 10/03/23 1031

## 2023-10-09 ENCOUNTER — Encounter: Payer: Self-pay | Admitting: Internal Medicine

## 2023-10-14 ENCOUNTER — Encounter: Payer: Self-pay | Admitting: Internal Medicine

## 2023-10-14 ENCOUNTER — Other Ambulatory Visit: Payer: Self-pay

## 2023-10-14 NOTE — Patient Instructions (Signed)
Thank you for allowing the Chronic Care Management team to participate in your care.  

## 2023-10-14 NOTE — Patient Outreach (Unsigned)
 Complex Care Management   Visit Note  10/14/2023  Name:  Diana Harmon MRN: [id number] DOB: [birth year]  Situation: Referral received for Complex Care Management related to Heart Failure, HTN, and DM. I obtained verbal consent from Patient.  Visit completed with Diana Harmon via telephone.  Background:   Past Medical History:  Diagnosis Date   Anemia    Arthritis    legs, knees, back, wrists   Diabetes mellitus without complication (HCC)    Hypertension    Nausea    Patient is [name]    S/P TAVR (transcatheter aortic valve replacement) 11/09/2019   s/p TAVR with a 26 mm Edwards S3U via the TF approach by Dr. Abel Hoe & Bartle   Severe aortic stenosis    SIRS (systemic inflammatory response syndrome) (HCC) 11/2019   Sjogren's disease (HCC)    Thyroid  disease       Assessment: Patient Reported Symptoms: Cognitive Cognitive Status: Alert and oriented to person, place, and time, Normal speech and language skills Cognitive/Intellectual Conditions Management [RPT]: None reported or documented in medical history or problem list Health Maintenance Behaviors: Annual physical exam, Hobbies, Social activities, Spiritual practice(s) Healing Pattern: Average Health Facilitated by: Healthy diet, Prayer/meditation, Rest  Neurological Neurological Review of Symptoms: No symptoms reported, Other: [place]her Neurological Symptoms/Conditions [RPT]: Denies episodes of dizziness today but notes several episodes over the past few weeks which seems to be cardiac related.  HEENT HEENT Symptoms Reported: No symptoms reported  Cardiovascular Cardiovascular Symptoms Reported: Dizziness  Respiratory Respiratory Symptoms Reported: No symptoms reported Other Respiratory Symptoms: She was evaluated in the Urgent Care for URI on Oct 03, 2023. Reports doing well today. Confirmed completing her 7 day course of antibiotics.  Endocrine Patient reports the following symptoms related to hypoglycemia  or hyperglycemia : No symptoms reported  Gastrointestinal Gastrointestinal Symptoms Reported: No symptoms reported  Genitourinary Genitourinary Symptoms Reported: No symptoms reported  Integumentary Integumentary Symptoms Reported: No symptoms reported  Musculoskeletal Musculoskelatal Symptoms Reviewed: No symptoms reported  Psychosocial Psychosocial Symptoms Reported: No symptoms reported Quality of Family Relationships: supportive Do you feel physically threatened by others?: No      10/14/2023    3:43 PM  Depression screen PHQ 2/9  Decreased Interest 0  Down, Depressed, Hopeless 0  PHQ - 2 Score 0    Vitals:   10/14/23 1316  BP: (!) 125/53    Medications Reviewed Today     Reviewed by Roxie Cord, RN (Registered Nurse) on 10/14/23 at 1328  Med List Status: <None>   Medication Order Taking? Sig Documenting Provider Last Dose Status Informant  ACCU-CHEK GUIDE test strip [id number] No USE TO CHECK BLOOD SUGAR TWICE DAILY Raina Bunting, DO Taking Active   Accu-Chek Softclix Lancets lancets [id number] No USE TO CHECK BLOOD GLUCOSE TWICE DAILY Romeo Co Kayleen Party, DO Taking Active   acetaminophen  (TYLENOL ) 500 MG tablet [id number] No Take 1,000 mg by mouth every 6 (six) hours as needed for moderate pain.  [provider] Taking Active Child  Artificial Tear Solution (GENTEAL TEARS) 0.1-0.2-0.3 % SOLN [id number] No Place 1 drop into both eyes 3 (three) times daily as needed (dry/irritated eyes.). [provider] Taking Active Child  aspirin  81 MG chewable tablet [id number] No Chew 1 tablet (81 mg total) by mouth daily. Ardia Kraft, PA-C Taking Active Child  B COMPLEX-C PO [id number] No Take 10 mLs by mouth daily. [provider] Taking Active Child  baclofen  (LIORESAL ) 10 MG tablet [id number] No TAKE 1 TO 2  TABLETS BY MOUTH TWICE DAILY AS NEEDED FOR MUSCLE SPASM Raina Bunting, DO Taking Active   benzonatate  (TESSALON ) 100 MG  capsule [id number]  Take 2 capsules (200 mg total) by mouth every 8 (eight) hours. Kent Pear, NP  Active   Blood Glucose Monitoring Suppl (ACCU-CHEK GUIDE) w/Device KIT [id number] No Use to check blood sugar twice per day Raina Bunting, DO Taking Active   Cholecalciferol (VITAMIN D3) 125 MCG (5000 UT) TABS [id number] No Take 5,000 Units by mouth daily. [provider] Taking Active Child  Discontinued 09/05/19 1316   cyanocobalamin  1000 MCG tablet [id number] No Take by mouth every other day. [provider] Taking Active            Med Note Alla Isaacs, [facility]   Mon Jan 13, 2023 10:15 AM)    dapagliflozin propanediol (FARXIGA) 10 MG TABS tablet [id number] No Take 10 mg by mouth daily. [provider] Taking Active   Ferrous Sulfate  (IRON ) 325 (65 Fe) MG TABS [id number] No TAKE 1 TABLET BY MOUTH TWICE DAILY WITH A MEAL [name], Kayleen Party, DO Taking Active Child  furosemide  (LASIX ) 40 MG tablet [id number] No Take 1 tablet (40 mg total) by mouth daily.  Patient taking differently: Take 20 mg by mouth daily as needed.   Raina Bunting, DO Taking Active   hydrochlorothiazide  (HYDRODIURIL ) 25 MG tablet [id number] No Take 1 tablet (25 mg total) by mouth daily. Raina Bunting, DO Taking Active   hydroxychloroquine (PLAQUENIL) 200 MG tablet [id number] No Take 1 tablet by mouth 2 (two) times daily. [provider] Taking Active   ipratropium (ATROVENT ) 0.06 % nasal spray [id number]  Place 2 sprays into both nostrils 4 (four) times daily. Kent Pear, NP  Active   lactobacillus acidophilus (BACID) TABS tablet [id number] No Take 1 tablet by mouth daily as needed (digestive health.). [provider] Taking Active            Med Note Kolleen Perone   Wed Nov 03, 2019  3:06 PM)    Lancets Misc. KIT [id number] No Use to check blood sugar twice daily [provider] Taking Active   levocetirizine (XYZAL ) 5 MG tablet [id number] No  TAKE 1 TABLET BY MOUTH ONCE DAILY IN THE EVENING  Patient taking differently: Take 1/2 tablet (2.5 mg) every other day in the evening as needed for allergies   Raina Bunting, DO Taking Active   losartan  (COZAAR ) 50 MG tablet [id number] No Take 1 tablet (50 mg total) by mouth daily. Raina Bunting, DO Taking Active   magnesium  oxide (MAG-OX) 400 MG tablet [id number] No Take 400 mg by mouth daily. [provider] Taking Active   mupirocin  ointment (BACTROBAN ) 2 % [id number] No Apply 1 Application topically 2 (two) times daily.  Patient taking differently: Apply 1 Application topically 2 (two) times daily as needed.   Raina Bunting, DO Taking Active   promethazine -dextromethorphan (PROMETHAZINE -DM) 6.25-15 MG/5ML syrup [id number]  Take 5 mLs by mouth 4 (four) times daily as needed. Kent Pear, NP  Active   simvastatin  (ZOCOR ) 20 MG tablet [id number] No TAKE 1 TABLET BY MOUTH ONCE DAILY AT  Brigida Canal, Kayleen Party, DO Taking Active   TRULICITY  1.5 MG/0.5ML [place] [id number] No Inject 1.5 mg into the skin once a week. Raina Bunting, DO Taking Active             Recommendation:   Continue Current Plan of Care Follow up with the Cardiology team  with contacted for follow-up  Follow Up Plan:   Telephone follow up appointment on November 05, 2023   Roxie Cord [facility] RN Care Manager Direct Dial: [phone]  Fax: [phone] Website: [email or web address]

## 2023-10-14 NOTE — Telephone Encounter (Signed)
 error

## 2023-11-05 ENCOUNTER — Other Ambulatory Visit: Payer: Self-pay

## 2023-11-05 NOTE — Patient Outreach (Unsigned)
 Complex Care Management   Visit Note  11/05/2023  Name:  Diana Harmon MRN: [id number] DOB: [birth year]  Situation: Referral received for Complex Care Management related to {Criteria:32550} I obtained verbal consent from {CHL AMB Patient/Caregiver:28184}.  Visit completed with ***  {VISIT LOCATION:32553}  Background:   Past Medical History:  Diagnosis Date   Anemia    Arthritis    legs, knees, back, wrists   Diabetes mellitus without complication (HCC)    Hypertension    Nausea    Patient is [name]    S/P TAVR (transcatheter aortic valve replacement) 11/09/2019   s/p TAVR with a 26 mm Edwards S3U via the TF approach by Dr. Verlin & Bartle   Severe aortic stenosis    SIRS (systemic inflammatory response syndrome) (HCC) 11/2019   Sjogren's disease (HCC)    Thyroid  disease     Assessment: Patient Reported Symptoms:  Cognitive        Neurological      HEENT        Cardiovascular      Respiratory      Endocrine      Gastrointestinal        Genitourinary      Integumentary      Musculoskeletal          Psychosocial       Quality of Family Relationships: supportive Do you feel physically threatened by others?: No      10/14/2023    3:43 PM  Depression screen PHQ 2/9  Decreased Interest 0  Down, Depressed, Hopeless 0  PHQ - 2 Score 0    Vitals:   11/05/23 1013  BP: 135/62  Pulse: 81    Medications Reviewed Today     Reviewed by Karoline Lima, RN (Registered Nurse) on 11/05/23 at 1015  Med List Status: <None>   Medication Order Taking? Sig Documenting Provider Last Dose Status Informant  ACCU-CHEK GUIDE test strip [id number] No USE TO CHECK BLOOD SUGAR TWICE DAILY Edman Marsa PARAS, DO Taking Active   Accu-Chek Softclix Lancets lancets [id number] No USE TO CHECK BLOOD GLUCOSE TWICE DAILY Edman Marsa PARAS, DO Taking Active   acetaminophen  (TYLENOL ) 500 MG tablet [id number] No Take 1,000 mg by mouth every 6 (six)  hours as needed for moderate pain.  [provider] Taking Active Child  Artificial Tear Solution (GENTEAL TEARS) 0.1-0.2-0.3 % SOLN [id number] No Place 1 drop into both eyes 3 (three) times daily as needed (dry/irritated eyes.). [provider] Taking Active Child  aspirin  81 MG chewable tablet [id number] No Chew 1 tablet (81 mg total) by mouth daily. Sebastian Lamarr SAUNDERS, PA-C Taking Active Child  B COMPLEX-C PO [id number] No Take 10 mLs by mouth daily. [provider] Taking Active Child  baclofen  (LIORESAL ) 10 MG tablet [id number] No TAKE 1 TO 2 TABLETS BY MOUTH TWICE DAILY AS NEEDED FOR MUSCLE SPASM Edman, Marsa PARAS, DO Taking Active   benzonatate  (TESSALON ) 100 MG capsule [id number]  Take 2 capsules (200 mg total) by mouth every 8 (eight) hours. Bernardino Ditch, NP  Active   Blood Glucose Monitoring Suppl (ACCU-CHEK GUIDE) w/Device KIT [id number] No Use to check blood sugar twice per day Edman Marsa PARAS, DO Taking Active   Cholecalciferol (VITAMIN D3) 125 MCG (5000 UT) TABS [id number] No Take 5,000 Units by mouth daily. [provider] Taking Active Child  Discontinued 09/05/19 1316   cyanocobalamin  1000 MCG tablet [id number] No Take by mouth every other day. [provider] Taking Active  Med Note [place], ELISABETH A   Mon Jan 13, 2023 10:15 AM)    dapagliflozin propanediol (FARXIGA) 10 MG TABS tablet [id number] No Take 10 mg by mouth daily. [provider] Taking Active   Ferrous Sulfate  (IRON ) 325 (65 Fe) MG TABS [id number] No TAKE 1 TABLET BY MOUTH TWICE DAILY WITH A MEAL [name], Marsa PARAS, DO Taking Active Child  furosemide  (LASIX ) 40 MG tablet [id number] No Take 1 tablet (40 mg total) by mouth daily.  Patient taking differently: Take 20 mg by mouth daily as needed.   Edman Marsa PARAS, DO Taking Active   hydrochlorothiazide  (HYDRODIURIL ) 25 MG tablet [id number] No Take 1 tablet (25 mg total) by mouth daily.  Edman Marsa PARAS, DO Taking Active   hydroxychloroquine (PLAQUENIL) 200 MG tablet [id number] No Take 1 tablet by mouth 2 (two) times daily. [provider] Taking Active   ipratropium (ATROVENT ) 0.06 % nasal spray [id number]  Place 2 sprays into both nostrils 4 (four) times daily. Bernardino Ditch, NP  Active   lactobacillus acidophilus (BACID) TABS tablet [id number] No Take 1 tablet by mouth daily as needed (digestive health.). [provider] Taking Active            Med Note SOILA LYLE BROCKS   Wed Nov 03, 2019  3:06 PM)    Lancets Misc. KIT [id number] No Use to check blood sugar twice daily [provider] Taking Active   levocetirizine (XYZAL ) 5 MG tablet [id number] No TAKE 1 TABLET BY MOUTH ONCE DAILY IN THE EVENING  Patient taking differently: Take 1/2 tablet (2.5 mg) every other day in the evening as needed for allergies   Edman Marsa PARAS, DO Taking Active   losartan  (COZAAR ) 50 MG tablet [id number] No Take 1 tablet (50 mg total) by mouth daily. Edman Marsa PARAS, DO Taking Active   magnesium  oxide (MAG-OX) 400 MG tablet [id number] No Take 400 mg by mouth daily. [provider] Taking Active   mupirocin  ointment (BACTROBAN ) 2 % [id number] No Apply 1 Application topically 2 (two) times daily.  Patient taking differently: Apply 1 Application topically 2 (two) times daily as needed.   Edman Marsa PARAS, DO Taking Active   promethazine -dextromethorphan (PROMETHAZINE -DM) 6.25-15 MG/5ML syrup [id number]  Take 5 mLs by mouth 4 (four) times daily as needed. Bernardino Ditch, NP  Active   simvastatin  (ZOCOR ) 20 MG tablet [id number] No TAKE 1 TABLET BY MOUTH ONCE DAILY AT  JEREL Edman, Marsa PARAS, DO Taking Active   TRULICITY  1.5 MG/0.5ML EMMANUEL [id number] No Inject 1.5 mg into the skin once a week. Edman Marsa PARAS, DO Taking Active             Recommendation:   {RECOMMENDATONS:32554}  Follow Up Plan:   {FOLLOWUP:32559}  SIG  ***

## 2023-11-05 NOTE — Patient Instructions (Signed)
Thank you for allowing the Chronic Care Management team to participate in your care.  

## 2023-11-10 DIAGNOSIS — E113393 Type 2 diabetes mellitus with moderate nonproliferative diabetic retinopathy without macular edema, bilateral: Secondary | ICD-10-CM | POA: Diagnosis not present

## 2023-11-17 ENCOUNTER — Other Ambulatory Visit: Admitting: Pharmacist

## 2023-11-17 DIAGNOSIS — Z7985 Long-term (current) use of injectable non-insulin antidiabetic drugs: Secondary | ICD-10-CM

## 2023-11-17 DIAGNOSIS — I10 Essential (primary) hypertension: Secondary | ICD-10-CM

## 2023-11-17 DIAGNOSIS — I5032 Chronic diastolic (congestive) heart failure: Secondary | ICD-10-CM

## 2023-11-17 DIAGNOSIS — E1169 Type 2 diabetes mellitus with other specified complication: Secondary | ICD-10-CM

## 2023-11-17 NOTE — Patient Instructions (Signed)
Goals Addressed             This Visit's Progress    Pharmacy Goals       Our goal A1c is less than 7%. This corresponds with fasting sugars less than 130 and 2 hour after meal sugars less than 180. Please keep a log of your results when checking your blood sugar  Our goal bad cholesterol, or LDL, is less than 70 . This is why it is important to continue taking your simvastatin  Please check your home blood pressure, keep a log of the results and bring this with you to your medical appointments.  Feel free to call me with any questions or concerns. I look forward to our next call!   Wallace Cullens, PharmD, Para March, CPP Clinical Pharmacist [facility] [phone]

## 2023-11-17 NOTE — Progress Notes (Signed)
 11/17/2023 Name: Diana Harmon MRN: [id number] DOB: February 26, 1946  Chief Complaint  Patient presents with   Medication Assistance   Medication Management    Diana Harmon is a 78 y.o. year old female who presented for a telephone visit.   They were referred to the pharmacist by their PCP for assistance in managing diabetes, hypertension, hyperlipidemia, and medication access.      Subjective:   Care Team: Primary Care Provider: Edman Marsa PARAS, DO; Next Scheduled Visit: 12/16/2023 Rheumatologist: Diana Lady Plumb, MD; Next Scheduled Visit: 12/23/2023 Hematologist: Diana Cindy SAUNDERS, MD Nurse Care Manager: Diana Lima, RN; Next Scheduled Visit: 12/18/2023 Cardiologist: Diana Bruckner, MD GI Specialist: Diana Twyla Rocks, PA; Next Scheduled Visit: Tomorrow  Medication Access/Adherence  Current Pharmacy:  [facility] [address] ([address]), [place] - [address] [place]) [place] [address] Phone: [phone] Fax: [phone]  [facility] [address] [place] - [address] [place] [address] Phone: [phone] Fax: [phone]  [email or web address] - [place], [place] - [phone] [facility] Diana Harmon [address] Phone: [phone] Fax: [phone]  [facility] - [place], [place] - [address] [place] [address] Phone: [phone] Fax: [phone]  [id number] [place], [place] - [address] [facility] [address] AT [facility] OF [address] [place] [address] Phone: [phone] Fax: [phone]   Patient reports affordability concerns with their medications: No Patient reports access/transportation concerns to their pharmacy: No  Patient reports adherence concerns with their medications:  No      Today patient shares that she started to have trouble  with her swallowing a couple of months ago and her GI specialist started her on pantoprazole 40 mg daily. She is scheduled for follow up with GI specialist tomorrow.  Diabetes:   Current medications:  - Trulicity  1.5 mg weekly on Saturdays             Reports tolerating well - Farxiga 10 mg daily    Medications tried in the past: metformin    Reports recent morning blood sugar readings ranging 98-127   Patient denies hypoglycemic s/sx including dizziness, shakiness, sweating.    Current physical activity: Reports exercise limited by arthritis; working on gradually increasing her walking   Statin therapy: simvastatin  20 mg daily   Current medication access support:  - Enrolled in patient assistance for Trulicity  through [employer] through 05/05/2024 - Enrolled in patient assistance for Farxiga through [employer] through 05/05/2024      HTN/Chronic HFpEF :   Current medications:  ACEi/ARB/ARNI: losartan  50 mg daily SGLT2 inhibitor: Farxiga 10 mg daily (reports started ~1 month ago) Diuretic regimen: furosemide  40 mg - 1/2 (20 mg) daily as needed HCTZ 25 mg daily   Last checked blood pressure: - Today: 115/49, HR 71 - Yesterday: AM: 116/48, HR 72; PM: 110/47, HR 72  Denies symptoms of hypotension such as dizziness or lightheadedness     Reports monitoring daily weight. Reports elevating legs during the day and wearing compression stockings helps   Current physical activity: Reports exercise limited by arthritis - walks using walker consistently   Current medication access support:  - Enrolled in patient assistance for Farxiga through [employer] through 05/05/2024     Objective:  Lab Results  Component Value Date   HGBA1C 6.5 (H) 06/10/2023    Lab Results  Component Value  Date   CREATININE 1.39 (H) 07/10/2023   BUN 45 (H) 07/10/2023   NA 141 07/10/2023   K 4.3 07/10/2023   CL 104 07/10/2023   CO2 26 07/10/2023    Lab Results  Component Value Date   CHOL 162 06/10/2023    HDL 87 06/10/2023   LDLCALC 61 06/10/2023   TRIG 63 06/10/2023   CHOLHDL 1.9 06/10/2023   BP Readings from Last 3 Encounters:  11/05/23 135/62  10/14/23 (!) 125/53  10/03/23 (!) 139/58   Pulse Readings from Last 3 Encounters:  11/05/23 81  10/03/23 77  07/10/23 74     Medications Reviewed Today     Reviewed by Diana Harmon, RPH-CPP (Pharmacist) on 11/17/23 at 1100  Med List Status: <None>   Medication Order Taking? Sig Documenting Provider Last Dose Status Informant  ACCU-CHEK GUIDE test strip [id number]  USE TO CHECK BLOOD SUGAR TWICE DAILY Diana Marsa PARAS, DO  Active   Accu-Chek Softclix Lancets lancets [id number]  USE TO CHECK BLOOD GLUCOSE TWICE DAILY Diana, Marsa PARAS, DO  Active   acetaminophen  (TYLENOL ) 500 MG tablet [id number]  Take 1,000 mg by mouth every 6 (six) hours as needed for moderate pain.  [provider]  Active Child  Artificial Tear Solution (GENTEAL TEARS) 0.1-0.2-0.3 % SOLN [id number]  Place 1 drop into both eyes 3 (three) times daily as needed (dry/irritated eyes.). [provider]  Active Child  aspirin  81 MG chewable tablet [id number]  Chew 1 tablet (81 mg total) by mouth daily. Diana Harmon  Active Child  B COMPLEX-C PO [id number]  Take 10 mLs by mouth daily. [provider]  Active Child  baclofen  (LIORESAL ) 10 MG tablet [id number]  TAKE 1 TO 2 TABLETS BY MOUTH TWICE DAILY AS NEEDED FOR MUSCLE SPASM Diana Marsa PARAS, DO  Active    Patient not taking:   Discontinued 11/17/23 1049 (No longer needed (for PRN medications))   Blood Glucose Monitoring Suppl (ACCU-CHEK GUIDE) w/Device KIT [id number]  Use to check blood sugar twice per day Diana Marsa PARAS, DO  Active   Cholecalciferol (VITAMIN D3) 125 MCG (5000 UT) TABS [id number]  Take 5,000 Units by mouth daily. [provider]  Active Child    Discontinued 09/05/19 1316   cyanocobalamin  1000 MCG tablet [id number]  Take by mouth every  other day. [provider]  Active            Med Note [place], [facility] A   Mon Jan 13, 2023 10:15 AM)    dapagliflozin propanediol (FARXIGA) 10 MG TABS tablet [id number] Yes Take 10 mg by mouth daily. [provider]  Active   Ferrous Sulfate  (IRON ) 325 (65 Fe) MG TABS [id number]  TAKE 1 TABLET BY MOUTH TWICE DAILY WITH A MEAL [name], Marsa PARAS, DO  Active Child  furosemide  (LASIX ) 40 MG tablet [id number] Yes Take 1 tablet (40 mg total) by mouth daily.  Patient taking differently: Take 20 mg by mouth daily as needed.   Diana Marsa PARAS, DO  Active   hydrochlorothiazide  (HYDRODIURIL ) 25 MG tablet [id number] Yes Take 1 tablet (25 mg total) by mouth daily. Diana Marsa PARAS, DO  Active   hydroxychloroquine (PLAQUENIL) 200 MG tablet [id number]  Take 1 tablet by mouth 2 (two) times daily. [provider]  Active   ipratropium (ATROVENT ) 0.06 % nasal spray [id number]  Place 2 sprays into both nostrils 4 (four) times daily. Bernardino Ditch, NP  Active   lactobacillus acidophilus (BACID) TABS tablet  [id number]  Take 1 tablet by mouth daily as needed (digestive health.). [provider]  Active            Med Note SOILA LYLE BROCKS   Wed Nov 03, 2019  3:06 PM)    Lancets Misc. KIT [id number]  Use to check blood sugar twice daily [provider]  Active   levocetirizine (XYZAL ) 5 MG tablet [id number]  TAKE 1 TABLET BY MOUTH ONCE DAILY IN THE EVENING  Patient taking differently: Take 1/2 tablet (2.5 mg) every other day in the evening as needed for allergies   Diana Marsa PARAS, DO  Active   losartan  (COZAAR ) 50 MG tablet [id number] Yes Take 1 tablet (50 mg total) by mouth daily. Diana Marsa PARAS, DO  Active   magnesium  oxide (MAG-OX) 400 MG tablet [id number]  Take 400 mg by mouth daily. [provider]  Active   mupirocin  ointment (BACTROBAN ) 2 % [id number]  Apply 1 Application topically 2 (two) times daily.  Patient taking  differently: Apply 1 Application topically 2 (two) times daily as needed.   Diana Marsa PARAS, DO  Active   pantoprazole (PROTONIX) 40 MG tablet [id number] Yes Take 40 mg by mouth daily. [provider]  Active    Patient not taking:   Discontinued 11/17/23 1049   simvastatin  (ZOCOR ) 20 MG tablet [id number]  TAKE 1 TABLET BY MOUTH ONCE DAILY AT  [address], DO  Active   TRULICITY  1.5 MG/0.5ML EMMANUEL [id number] Yes Inject 1.5 mg into the skin once a week. Diana Marsa PARAS, DO  Active               Assessment/Plan:   Diabetes: - Currently controlled - Reviewed long term cardiovascular and renal outcomes of uncontrolled blood sugar - Encouraged patient to have regular well-balanced meals throughout the day, while controlling carbohydrate portion sizes - Encouraged patient to continue to use blood sugar checks as feedback on dietary choices - Encouraged patient to continue to monitor home blood sugar, keep log and bring record with her to medical appointments - Patient to follow up with [employer] as needed for refills of Trulicity  and with [employer] as needed for refills of Farxiga     HTN/Chronic HFpEF : - Reviewed appropriate blood pressure monitoring technique and reviewed goal blood pressure - Have reviewed to weigh daily and when to contact PCP/cardiology with weight gain     Follow Up Plan: Clinical Pharmacist will follow up with patient by telephone on 01/19/2024 at 10:30 AM     Sharyle Sia, PharmD, [facility] [phone]

## 2023-11-18 DIAGNOSIS — R1314 Dysphagia, pharyngoesophageal phase: Secondary | ICD-10-CM | POA: Diagnosis not present

## 2023-11-18 DIAGNOSIS — R1313 Dysphagia, pharyngeal phase: Secondary | ICD-10-CM | POA: Diagnosis not present

## 2023-11-18 DIAGNOSIS — K449 Diaphragmatic hernia without obstruction or gangrene: Secondary | ICD-10-CM | POA: Diagnosis not present

## 2023-11-18 DIAGNOSIS — Q394 Esophageal web: Secondary | ICD-10-CM | POA: Diagnosis not present

## 2023-12-16 ENCOUNTER — Other Ambulatory Visit: Payer: Self-pay | Admitting: Family Medicine

## 2023-12-16 ENCOUNTER — Encounter: Payer: Self-pay | Admitting: Family Medicine

## 2023-12-16 ENCOUNTER — Ambulatory Visit: Payer: Medicare Other | Admitting: Family Medicine

## 2023-12-16 VITALS — BP 124/60 | HR 80 | Ht 60.0 in | Wt 243.4 lb

## 2023-12-16 DIAGNOSIS — I5032 Chronic diastolic (congestive) heart failure: Secondary | ICD-10-CM

## 2023-12-16 DIAGNOSIS — M5442 Lumbago with sciatica, left side: Secondary | ICD-10-CM

## 2023-12-16 DIAGNOSIS — G8929 Other chronic pain: Secondary | ICD-10-CM

## 2023-12-16 DIAGNOSIS — I10 Essential (primary) hypertension: Secondary | ICD-10-CM

## 2023-12-16 DIAGNOSIS — Z7985 Long-term (current) use of injectable non-insulin antidiabetic drugs: Secondary | ICD-10-CM

## 2023-12-16 DIAGNOSIS — D649 Anemia, unspecified: Secondary | ICD-10-CM

## 2023-12-16 DIAGNOSIS — Z0000 Encounter for general adult medical examination without abnormal findings: Secondary | ICD-10-CM

## 2023-12-16 DIAGNOSIS — E1169 Type 2 diabetes mellitus with other specified complication: Secondary | ICD-10-CM

## 2023-12-16 DIAGNOSIS — M5441 Lumbago with sciatica, right side: Secondary | ICD-10-CM

## 2023-12-16 DIAGNOSIS — E7800 Pure hypercholesterolemia, unspecified: Secondary | ICD-10-CM

## 2023-12-16 LAB — POCT GLYCOSYLATED HEMOGLOBIN (HGB A1C): Hemoglobin A1C: 6.1 % — AB (ref 4.0–5.6)

## 2023-12-16 MED ORDER — GABAPENTIN 100 MG PO CAPS: ORAL_CAPSULE | ORAL | 2 refills | Status: DC

## 2023-12-16 NOTE — Progress Notes (Signed)
 Subjective:    Patient ID: Diana Harmon, female    DOB: May 26, 1945, 78 y.o.   MRN: [id number]  Diana Harmon is a 78 y.o. female presenting on 12/16/2023 for Medical Management of Chronic Issues and Diabetes   HPI  Discussed the use of AI scribe software for clinical note transcription with the patient, who gave verbal consent to proceed.  History of Present Illness   Diana Harmon is a 78 year old female with diabetes who presents for a routine follow-up visit.  Hypotension - Low blood pressure readings over the past month, with diastolic values often in the 40s - Occasional sensation of feeling 'a little off kilter' - No episodes of syncope or loss of balance - On hydrochlorothiazide  25mg  daily, Losartan  50mg  daily  Peripheral neuropathy and neuropathic pain - Nerve pain primarily on the right side, with numbness extending down the right leg to the foot - Tingling and numbness in the back and right leg - History of nerve conduction study for carpal tunnel syndrome; no recollection of findings related to leg symptoms - Previous use of gabapentin  for nerve pain several years ago - Recently refilled gabapentin  prescription for current symptoms - Current medications include hydrochlorothiazide  and baclofen   Allergic symptoms - Prescription for Xyzal  for allergies - No recent use of Xyzal       CHRONIC DM, Type 2 with Hyperlipidemia, Morbid Obesity HTN CKD-III Creatinine mild elevated 1.39 range, prior 1.2 to 1.4, seems stable on last lab A1c 6.1 improved from previous 6.5-7 Improved diet, more fruit and vegetables No longer on Metformin  due to side effects  CBGs: Improved. Meds - Trulicity  1.5mg  weekly inj - on financial assistance, On Farxiga 10mg  daily,  Reports good compliance, tolerating med well Currently on ARB, ASA 81, Simvastatin  20mg  [facility] 05/12/23 - negative DM Retinopathy Denies hypoglycemia   Anemia, chronic Improved Hgb to  11.0, stable last lab 07/2023   PMH Sjogren's Syndrome Controlled on Hydroxychloroquine   CHF No recent flare up or sudden wt gain or fluid build up       11/05/2023   10:49 AM 10/14/2023    3:43 PM 09/19/2023    1:20 PM  Depression screen PHQ 2/9  Decreased Interest 0 0 0  Down, Depressed, Hopeless 0 0 0  PHQ - 2 Score 0 0 0       08/11/2023    1:30 PM 06/17/2023    9:29 AM 03/17/2023    1:21 PM 11/06/2022    8:58 AM  GAD 7 : Generalized Anxiety Score  Nervous, Anxious, on Edge 0 0 0 0  Control/stop worrying 0 0 0 0  Worry too much - different things 0 0 0 0  Trouble relaxing 0 0 0 0  Restless 0 0 0 0  Easily annoyed or irritable 0 0 0 0  Afraid - awful might happen 0 0 0 0  Total GAD 7 Score 0 0 0 0  Anxiety Difficulty Not difficult at all   Not difficult at all    Social History   Tobacco Use   Smoking status: Former    Current packs/day: 0.00    Types: Cigarettes    Start date: 09/05/1959    Quit date: 09/05/1979    Years since quitting: 44.3   Smokeless tobacco: Former  Building services engineer status: Never Used  Substance Use Topics   Alcohol  use: Not Currently   Drug use: No    Review of Systems  Per HPI unless specifically indicated above     Objective:    BP 124/60 (BP Location: Right Arm, Patient Position: Sitting, Cuff Size: Large)   Pulse 80   Ht 5' (1.524 m)   Wt 243 lb 6 oz (110.4 kg)   SpO2 97%   BMI 47.53 kg/m   Wt Readings from Last 3 Encounters:  12/16/23 243 lb 6 oz (110.4 kg)  09/19/23 235 lb (106.6 kg)  08/11/23 235 lb (106.6 kg)    Physical Exam Vitals and nursing note reviewed.  Constitutional:      General: She is not in acute distress.    Appearance: She is well-developed. She is obese. She is not diaphoretic.     Comments: Well-appearing, comfortable, cooperative  HENT:     Head: Normocephalic and atraumatic.  Eyes:     General:        Right eye: No discharge.        Left eye: No discharge.     Conjunctiva/sclera:  Conjunctivae normal.  Neck:     Thyroid : No thyromegaly.  Cardiovascular:     Rate and Rhythm: Normal rate and regular rhythm.     Heart sounds: Normal heart sounds. No murmur heard. Pulmonary:     Effort: Pulmonary effort is normal. No respiratory distress.     Breath sounds: Normal breath sounds. No wheezing or rales.  Musculoskeletal:        General: Normal range of motion.     Cervical back: Normal range of motion and neck supple.  Lymphadenopathy:     Cervical: No cervical adenopathy.  Skin:    General: Skin is warm and dry.     Findings: No erythema or rash.  Neurological:     Mental Status: She is alert and oriented to person, place, and time.  Psychiatric:        Behavior: Behavior normal.     Comments: Well groomed, good eye contact, normal speech and thoughts     Results for orders placed or performed in visit on 12/16/23  POCT HgB A1C   Collection Time: 12/16/23 10:48 AM  Result Value Ref Range   Hemoglobin A1C 6.1 (A) 4.0 - 5.6 %   HbA1c POC (<> result, manual entry)     HbA1c, POC (prediabetic range)     HbA1c, POC (controlled diabetic range)        Assessment & Plan:   Problem List Items Addressed This Visit     Chronic heart failure with preserved ejection fraction (HFpEF) (HCC)   Essential hypertension   Type 2 diabetes mellitus with other specified complication (HCC) - Primary   Relevant Orders   POCT HgB A1C (Completed)   Other Visit Diagnoses       Chronic bilateral low back pain with bilateral sciatica       Relevant Medications   gabapentin  (NEURONTIN ) 100 MG capsule     Long-term current use of injectable noninsulin antidiabetic medication             Right-sided lumbosacral radiculopathy (sciatica) Numbness and tingling in the right leg suggest nerve compression, possibly in the back, hip, or spine. Previous nerve conduction studies did not address leg symptoms as they were upper extremity only - Start gabapentin  in the evening,  increase dose as needed. Titrate dose to 100mg  THREE TIMES A DAY or higher - Provide nerve supplement sample with alpha lipoic acid. - Consider referral to [facility] Neurology Dr. Maree if symptoms persist .  Type 2 diabetes  mellitus Diabetes well-controlled with A1c of 6.1. Improved from previous values. Trulicity  and Farxiga effective, metformin  discontinued due to side effects. - Continue Trulicity  1.5 mg. - Continue Farxiga 10 mg daily. - Maintain diet with increased fruits and vegetables.  Hypertension Hypertension well-controlled at 124/60 mmHg. Occasional low diastolic readings causing lightheadedness, possibly due to hydrochlorothiazide . - If symptoms persist, reduce hydrochlorothiazide  dose by half. Instructions given, rx not changed officially we can submit new order for 12.5 dose in future if needed  General Health Maintenance Up to date with vaccinations and screenings. Flu shot due in fall, COVID booster optional. - Receive flu shot in September, October, or November. - Consider COVID booster if interested.  Follow-up Routine follow-up planned to monitor health conditions and medication effectiveness. - Schedule follow-up appointment in February for routine check-up and blood work.        Orders Placed This Encounter  Procedures   POCT HgB A1C    Meds ordered this encounter  Medications   gabapentin  (NEURONTIN ) 100 MG capsule    Sig: Start 1 capsule daily, increase by 1 cap every 2-3 days as tolerated up to 3 times a day, or may take 3 at once in evening.    Dispense:  90 capsule    Refill:  2    Follow up plan: Return for 6 month fasting lab > 1 week later Annual Physical.  Future labs ordered for 06/17/24   Marsa Officer, DO [facility] 12/16/2023, 11:07 AM

## 2023-12-16 NOTE — Patient Instructions (Addendum)
 Thank you for coming to the office today.  Recent Labs    06/10/23 0759 12/16/23 1048  HGBA1C 6.5* 6.1*     Recommend for some low BP readings, I recommend reduce half dose Hydrochlorothiazide  from 25mg  down to 12.5mg  try this to see if it is effective, if BP remains stable and no longer low. Especially if you develop lightheaded dizziness or other symptoms.  You likely have nerve pinch in the low back, seems to prefer Right side more than Left.  Start Gabapentin  100mg  capsules, take at night for 2-3 nights only, and then increase to 2 times a day for a few days, and then may increase to 3 times a day, it may make you drowsy, if helps significantly at night only, then you can increase instead to 3 capsules at night, instead of 3 times a day - In the future if needed, we can significantly increase the dose if tolerated well, some common doses are 300mg  three times a day up to 600mg  three times a day, usually it takes several weeks or months to get to higher doses  Recommend nerve supplement Nervive / Alpha Lipoic Acid 600mg  3 times per day up to 1800mg  per day  If not improving we can refer to Dr Maree Glenn [facility] for more nerve testing.  DUE for FASTING BLOOD WORK (no food or drink after midnight before the lab appointment, only water or coffee without cream/sugar on the morning of)  SCHEDULE Lab Only visit in the morning at the clinic for lab draw in 6 MONTHS   - Make sure Lab Only appointment is at about 1 week before your next appointment, so that results will be available  For Lab Results, once available within 2-3 days of blood draw, you can can log in to MyChart online to view your results and a brief explanation. Also, we can discuss results at next follow-up visit.   Please schedule a Follow-up Appointment to: Return for 6 month fasting lab > 1 week later Annual Physical.  If you have any other questions or concerns, please feel free to call the office or send a  message through MyChart. You may also schedule an earlier appointment if necessary.  Additionally, you may be receiving a survey about your experience at our office within a few days to 1 week by e-mail or mail. We value your feedback.  Marsa Officer, DO [facility] [place]

## 2023-12-18 ENCOUNTER — Other Ambulatory Visit: Payer: Self-pay

## 2023-12-18 NOTE — Patient Outreach (Unsigned)
 SABRA

## 2023-12-23 DIAGNOSIS — M3505 Sjogren syndrome with inflammatory arthritis: Secondary | ICD-10-CM | POA: Diagnosis not present

## 2023-12-23 DIAGNOSIS — Z7960 Long term (current) use of unspecified immunomodulators and immunosuppressants: Secondary | ICD-10-CM | POA: Diagnosis not present

## 2023-12-23 DIAGNOSIS — M150 Primary generalized (osteo)arthritis: Secondary | ICD-10-CM | POA: Diagnosis not present

## 2023-12-23 DIAGNOSIS — M0609 Rheumatoid arthritis without rheumatoid factor, multiple sites: Secondary | ICD-10-CM | POA: Diagnosis not present

## 2023-12-23 NOTE — Patient Instructions (Addendum)
 Thank you for allowing the Complex Care Management team to participate in your care. It was great speaking with you.  Keep up the great work with managing your care!  Reminders: -Please continue to monitor your blood pressure twice a day and record the readings. Please be sure to contact the Cardiology team if your pressures start to drop again and are outside of the established range.  We will follow up on February 17, 2024 at 1000. Please do not hesitate to contact me if you require assistance prior to our next outreach.    Jackson Acron [facility] Population Health RN Care Manager Direct Dial: [phone]  Fax: [phone] Website: [email or web address]

## 2023-12-23 NOTE — Patient Outreach (Signed)
 Complex Care Management   Visit Note    Name:  Diana Harmon MRN: [id number] DOB: June 16, 1945  Situation: Referral received for Complex Care Management related to Heart Failure, Diabetes and Hypertension. I obtained verbal consent from Patient.  Visit completed with Ms. Renstrom via telephone.  Background:   Past Medical History:  Diagnosis Date   Anemia    Arthritis    legs, knees, back, wrists   Diabetes mellitus without complication (HCC)    Hypertension    Nausea    Patient is [name]    S/P TAVR (transcatheter aortic valve replacement) 11/09/2019   s/p TAVR with a 26 mm Edwards S3U via the TF approach by Dr. Verlin & Bartle   Severe aortic stenosis    SIRS (systemic inflammatory response syndrome) (HCC) 11/2019   Sjogren's disease (HCC)    Thyroid  disease     Assessment: Patient Reported Symptoms: Cognitive Cognitive Status: Alert and oriented to person, place, and time, Normal speech and language skills Cognitive/Intellectual Conditions Management [RPT]: None reported or documented in medical history or problem list Health Maintenance Behaviors: Annual physical exam, Hobbies, Social activities, Sleep adequate Healing Pattern: Average Health Facilitated by: Healthy diet, Prayer/meditation, Rest  Neurological Neurological Review of Symptoms: No symptoms reported Neurological Self-Management Outcome: 4 (good)  HEENT HEENT Symptoms Reported: No symptoms reported HEENT Management Strategies: Coping strategies, Medication therapy, Routine screening HEENT Self-Management Outcome: 4 (good)  Cardiovascular Cardiovascular Symptoms Reported: No symptoms reported Does patient have uncontrolled Hypertension?: No  Respiratory Respiratory Symptoms Reported: No symptoms reported Respiratory Management Strategies: Routine screening Respiratory Self-Management Outcome: 4 (good)  Endocrine Endocrine Symptoms Reported: No symptoms reported Is patient diabetic?:  Yes Is patient checking blood sugars at home?: Yes List most recent blood sugar readings, include date and time of day: Reports fasting reading of 114 mg/dl today Endocrine Self-Management Outcome: 4 (good)  Gastrointestinal Gastrointestinal Symptoms Reported: No symptoms reported Gastrointestinal Management Strategies: Medication therapy Gastrointestinal Self-Management Outcome: 4 (good)  Genitourinary Genitourinary Symptoms Reported: No symptoms reported Genitourinary Self-Management Outcome: 4 (good)  Integumentary Integumentary Symptoms Reported: No symptoms reported Skin Management Strategies: Routine screening (Topical lotions and creams) Skin Self-Management Outcome: 5 (very good)  Musculoskeletal Musculoskelatal Symptoms Reviewed: No symptoms reported Musculoskeletal Self-Management Outcome: 4 (good)  Psychosocial Psychosocial Symptoms Reported: No symptoms reported Quality of Family Relationships: supportive, involved, helpful Do you feel physically threatened by others?: No    There were no vitals filed for this visit. Outpatient Encounter Medications as of 12/18/2023  Medication Sig   ACCU-CHEK GUIDE test strip USE TO CHECK BLOOD SUGAR TWICE DAILY   Accu-Chek Softclix Lancets lancets USE TO CHECK BLOOD GLUCOSE TWICE DAILY   acetaminophen  (TYLENOL ) 500 MG tablet Take 1,000 mg by mouth every 6 (six) hours as needed for moderate pain.    Artificial Tear Solution (GENTEAL TEARS) 0.1-0.2-0.3 % SOLN Place 1 drop into both eyes 3 (three) times daily as needed (dry/irritated eyes.).   aspirin  81 MG chewable tablet Chew 1 tablet (81 mg total) by mouth daily.   B COMPLEX-C PO Take 10 mLs by mouth daily.   baclofen  (LIORESAL ) 10 MG tablet TAKE 1 TO 2 TABLETS BY MOUTH TWICE DAILY AS NEEDED FOR MUSCLE SPASM   Blood Glucose Monitoring Suppl (ACCU-CHEK GUIDE) w/Device KIT Use to check blood sugar twice per day   Cholecalciferol (VITAMIN D3) 125 MCG (5000 UT) TABS Take 5,000 Units by mouth  daily.   cyanocobalamin  1000 MCG tablet Take by mouth every other day.   dapagliflozin  propanediol (FARXIGA) 10 MG TABS tablet Take 10 mg by mouth daily.   Ferrous Sulfate  (IRON ) 325 (65 Fe) MG TABS TAKE 1 TABLET BY MOUTH TWICE DAILY WITH A MEAL   furosemide  (LASIX ) 40 MG tablet Take 1 tablet (40 mg total) by mouth daily. (Patient taking differently: Take 20 mg by mouth daily as needed.)   gabapentin  (NEURONTIN ) 100 MG capsule Start 1 capsule daily, increase by 1 cap every 2-3 days as tolerated up to 3 times a day, or may take 3 at once in evening.   hydrochlorothiazide  (HYDRODIURIL ) 25 MG tablet Take 1 tablet (25 mg total) by mouth daily.   hydroxychloroquine (PLAQUENIL) 200 MG tablet Take 1 tablet by mouth 2 (two) times daily.   ipratropium (ATROVENT ) 0.06 % nasal spray Place 2 sprays into both nostrils 4 (four) times daily.   lactobacillus acidophilus (BACID) TABS tablet Take 1 tablet by mouth daily as needed (digestive health.).   Lancets Misc. KIT Use to check blood sugar twice daily   levocetirizine (XYZAL ) 5 MG tablet TAKE 1 TABLET BY MOUTH ONCE DAILY IN THE EVENING (Patient taking differently: Take 1/2 tablet (2.5 mg) every other day in the evening as needed for allergies)   losartan  (COZAAR ) 50 MG tablet Take 1 tablet (50 mg total) by mouth daily.   magnesium  oxide (MAG-OX) 400 MG tablet Take 400 mg by mouth daily.   mupirocin  ointment (BACTROBAN ) 2 % Apply 1 Application topically 2 (two) times daily. (Patient taking differently: Apply 1 Application topically 2 (two) times daily as needed.)   pantoprazole (PROTONIX) 40 MG tablet Take 40 mg by mouth daily.   simvastatin  (ZOCOR ) 20 MG tablet TAKE 1 TABLET BY MOUTH ONCE DAILY AT  6PM   TRULICITY  1.5 MG/0.5ML SOAJ Inject 1.5 mg into the skin once a week.   [DISCONTINUED] Cromolyn Sodium (NASAL ALLERGY NA) Place 1 spray into the nose daily as needed (allergies).   No facility-administered encounter medications on file as of 12/18/2023.       Recommendation:   Continue Current Plan of Care  Follow Up Plan:   Telephone follow up appointment with Nurse Case Manager on February 17, 2024   Jackson Acron [facility] RN Care Manager Direct Dial: [phone]  Fax: [phone] Website: [email or web address]

## 2024-01-09 ENCOUNTER — Inpatient Hospital Stay (HOSPITAL_BASED_OUTPATIENT_CLINIC_OR_DEPARTMENT_OTHER): Admitting: Internal Medicine

## 2024-01-09 ENCOUNTER — Encounter: Payer: Self-pay | Admitting: Internal Medicine

## 2024-01-09 ENCOUNTER — Inpatient Hospital Stay: Attending: Internal Medicine

## 2024-01-09 VITALS — BP 130/61 | HR 73 | Temp 96.8°F | Resp 16 | Ht 61.0 in | Wt 243.4 lb

## 2024-01-09 DIAGNOSIS — I129 Hypertensive chronic kidney disease with stage 1 through stage 4 chronic kidney disease, or unspecified chronic kidney disease: Secondary | ICD-10-CM | POA: Insufficient documentation

## 2024-01-09 DIAGNOSIS — E538 Deficiency of other specified B group vitamins: Secondary | ICD-10-CM | POA: Diagnosis not present

## 2024-01-09 DIAGNOSIS — M069 Rheumatoid arthritis, unspecified: Secondary | ICD-10-CM | POA: Diagnosis not present

## 2024-01-09 DIAGNOSIS — Z7982 Long term (current) use of aspirin: Secondary | ICD-10-CM | POA: Diagnosis not present

## 2024-01-09 DIAGNOSIS — D649 Anemia, unspecified: Secondary | ICD-10-CM | POA: Diagnosis not present

## 2024-01-09 DIAGNOSIS — M3500 Sicca syndrome, unspecified: Secondary | ICD-10-CM | POA: Diagnosis not present

## 2024-01-09 DIAGNOSIS — Z87891 Personal history of nicotine dependence: Secondary | ICD-10-CM | POA: Diagnosis not present

## 2024-01-09 DIAGNOSIS — E669 Obesity, unspecified: Secondary | ICD-10-CM | POA: Insufficient documentation

## 2024-01-09 DIAGNOSIS — D709 Neutropenia, unspecified: Secondary | ICD-10-CM | POA: Insufficient documentation

## 2024-01-09 DIAGNOSIS — N189 Chronic kidney disease, unspecified: Secondary | ICD-10-CM | POA: Diagnosis not present

## 2024-01-09 DIAGNOSIS — Z85040 Personal history of malignant carcinoid tumor of rectum: Secondary | ICD-10-CM | POA: Insufficient documentation

## 2024-01-09 DIAGNOSIS — D509 Iron deficiency anemia, unspecified: Secondary | ICD-10-CM | POA: Diagnosis not present

## 2024-01-09 DIAGNOSIS — Z803 Family history of malignant neoplasm of breast: Secondary | ICD-10-CM | POA: Insufficient documentation

## 2024-01-09 DIAGNOSIS — N183 Chronic kidney disease, stage 3 unspecified: Secondary | ICD-10-CM | POA: Insufficient documentation

## 2024-01-09 DIAGNOSIS — R0609 Other forms of dyspnea: Secondary | ICD-10-CM | POA: Insufficient documentation

## 2024-01-09 DIAGNOSIS — Z7984 Long term (current) use of oral hypoglycemic drugs: Secondary | ICD-10-CM | POA: Diagnosis not present

## 2024-01-09 DIAGNOSIS — I350 Nonrheumatic aortic (valve) stenosis: Secondary | ICD-10-CM | POA: Insufficient documentation

## 2024-01-09 DIAGNOSIS — E1122 Type 2 diabetes mellitus with diabetic chronic kidney disease: Secondary | ICD-10-CM | POA: Insufficient documentation

## 2024-01-09 DIAGNOSIS — Z79899 Other long term (current) drug therapy: Secondary | ICD-10-CM | POA: Diagnosis not present

## 2024-01-09 LAB — BASIC METABOLIC PANEL - CANCER CENTER ONLY
Anion gap: 9 (ref 5–15)
BUN: 35 mg/dL — ABNORMAL HIGH (ref 8–23)
CO2: 25 mmol/L (ref 22–32)
Calcium: 9.2 mg/dL (ref 8.9–10.3)
Chloride: 105 mmol/L (ref 98–111)
Creatinine: 1.57 mg/dL — ABNORMAL HIGH (ref 0.44–1.00)
GFR, Estimated: 34 mL/min — ABNORMAL LOW (ref >60)
Glucose, Bld: 104 mg/dL — ABNORMAL HIGH (ref 70–99)
Potassium: 4.2 mmol/L (ref 3.5–5.1)
Sodium: 139 mmol/L (ref 135–145)

## 2024-01-09 LAB — IRON AND TIBC
Iron: 97 ug/dL (ref 28–170)
Saturation Ratios: 23 % (ref 10.4–31.8)
TIBC: 416 ug/dL (ref 250–450)
UIBC: 319 ug/dL

## 2024-01-09 LAB — CBC WITH DIFFERENTIAL (CANCER CENTER ONLY)
Abs Immature Granulocytes: 0 K/uL (ref 0.00–0.07)
Basophils Absolute: 0 K/uL (ref 0.0–0.1)
Basophils Relative: 1 %
Eosinophils Absolute: 0.1 K/uL (ref 0.0–0.5)
Eosinophils Relative: 3 %
HCT: 34.4 % — ABNORMAL LOW (ref 36.0–46.0)
Hemoglobin: 10.7 g/dL — ABNORMAL LOW (ref 12.0–15.0)
Immature Granulocytes: 0 %
Lymphocytes Relative: 39 %
Lymphs Abs: 1.3 K/uL (ref 0.7–4.0)
MCH: 30.1 pg (ref 26.0–34.0)
MCHC: 31.1 g/dL (ref 30.0–36.0)
MCV: 96.6 fL (ref 80.0–100.0)
Monocytes Absolute: 0.4 K/uL (ref 0.1–1.0)
Monocytes Relative: 14 %
Neutro Abs: 1.4 K/uL — ABNORMAL LOW (ref 1.7–7.7)
Neutrophils Relative %: 43 %
Platelet Count: 133 K/uL — ABNORMAL LOW (ref 150–400)
RBC: 3.56 MIL/uL — ABNORMAL LOW (ref 3.87–5.11)
RDW: 14 % (ref 11.5–15.5)
WBC Count: 3.2 K/uL — ABNORMAL LOW (ref 4.0–10.5)
nRBC: 0 % (ref 0.0–0.2)

## 2024-01-09 LAB — VITAMIN B12: Vitamin B-12: 3059 pg/mL — ABNORMAL HIGH (ref 180–914)

## 2024-01-09 LAB — FERRITIN: Ferritin: 27 ng/mL (ref 11–307)

## 2024-01-09 NOTE — Assessment & Plan Note (Addendum)
#  Normocytic anemia unclear etiology; previous history of AVM iron  deficiency; September 2020-iron  studies negative for deficiency.  Bone marrow biopsy negative for any significant dyspoiesis.  Cytogenetics normal.  # Hemoglobin is 9-10 [chronic anemia-? Congential causes]-/? CKD vs others- no obvious evidence any severe iron  deficiency.                             # White count 3.0 chronic benign ethnic neutropenia-neutrophil count 1.4-/ mild thrombocytopenia > 100- monitor for now.   # Relative B12 deficiency-200; March 2023- 5000+- continue sublingual B12 tablets 2-3 times a week. Await B12 level from today.stable.SABRA  #Chronic kidney disease-stage III mild [ prior Nephrology-[facility]]- recommend repeat evaluation with wants another nephrology group. referral to [facility]  # DISPOSITION: # referral to [facility] re: CKD # in 6 months-MD; labs- cbc/bmp/ iron  studies/ferritin/ b12---Dr.B

## 2024-01-09 NOTE — Progress Notes (Signed)
 [facility] CONSULT NOTE  Patient Care Team: Edman Marsa PARAS, DO as PCP - General (Family Medicine) End, Lonni, MD as PCP - Cardiology (Cardiology) Rennie Cindy SAUNDERS, MD as Consulting Physician (Internal Medicine) Carolee Manus DASEN., MD (Ophthalmology) End, Lonni, MD as Consulting Physician (Cardiology) Rennie Cindy SAUNDERS, MD as Consulting Physician (Internal Medicine) Alana Sharyle LABOR, RPH-CPP as Pharmacist Karoline Lima, RN as [facility] Care Management  CHIEF COMPLAINTS/PURPOSE OF CONSULTATION:   # AUG 2016- IRON  DEFICIENCY ANEMIA-  Possible AVMs of colon [s/p Argon laser; Dr.Elliot;EGD-neg Nov 2016] s/p IV Ferriheme x2 [[id number]; ALLERGY]; Venofer  IV [pre-meds]; worsening anemia not responding to iron - November 2020-bone marrow biopsy-erythroid hyperplasia but no dysplasia no blasts. NOV 2020- CT A/P-negative; CXR-NEG.  #Relative B12 deficiency-sublingual B12 [Jan 2021]  #December 2020 -elevated anti-CCP- [s/p evaluation Dr. Konrad  # MILD LEUCOPENIA/NEUTROPENIA [facility] 1.2-1.3]-chronic ethnic neutropenia/asymptomatic  # Rectal well diff carcinoid [1.2CM; s/p polypectomy 2011]; Allergy- Vania [Nov 2016]- neck/face swelling [improved with benadryl ]; June 2021- TAVR for severe AS  HISTORY OF PRESENTING ILLNESS: Patient accompanied by daughter.  In a wheelchair.  Krysia Zahradnik Poole 78 y.o.  female is here for follow-up for anemia of unclear etiology- ? CKD-III/ DM and obesity; and RA  is here for follow-up.  Patient is doing well. Has some low energy. Chronic dyspnea with exertion. No visible blood in stools. Takes oral iron . Denies dizzines   No weight loss no blood in stools or black or stools.     Review of Systems  Constitutional:  Negative for chills, diaphoresis, fever and weight loss.  HENT:  Negative for nosebleeds and sore throat.   Eyes:  Negative for double vision.  Respiratory:  Negative for cough, hemoptysis,  sputum production, shortness of breath and wheezing.   Cardiovascular:  Negative for chest pain, palpitations, orthopnea and leg swelling.  Gastrointestinal:  Negative for abdominal pain, blood in stool, constipation, diarrhea, heartburn, melena, nausea and vomiting.  Genitourinary:  Negative for dysuria, frequency and urgency.  Musculoskeletal:  Positive for joint pain. Negative for back pain.  Skin: Negative.  Negative for itching and rash.  Neurological:  Negative for dizziness, tingling, focal weakness, weakness and headaches.  Endo/Heme/Allergies:  Does not bruise/bleed easily.  Psychiatric/Behavioral:  Negative for depression. The patient is not nervous/anxious and does not have insomnia.      MEDICAL HISTORY:  Past Medical History:  Diagnosis Date   Anemia    Arthritis    legs, knees, back, wrists   Diabetes mellitus without complication (HCC)    Hypertension    Nausea    Patient is [name]    S/P TAVR (transcatheter aortic valve replacement) 11/09/2019   s/p TAVR with a 26 mm Edwards S3U via the TF approach by Dr. Verlin & Bartle   Severe aortic stenosis    SIRS (systemic inflammatory response syndrome) (HCC) 11/2019   Sjogren's disease (HCC)    Thyroid  disease     SURGICAL HISTORY: Past Surgical History:  Procedure Laterality Date   CARDIAC CATHETERIZATION  10/19/2019   COLON SURGERY     caudarized polyps; rectal carcinoid excision ~ 2011   COLONOSCOPY     COLONOSCOPY WITH PROPOFOL  N/A 03/13/2015   Procedure: COLONOSCOPY WITH PROPOFOL ;  Surgeon: Lamar DASEN Holmes, MD;  Location: [facility] ENDOSCOPY;  Service: Endoscopy;  Laterality: N/A;   ESOPHAGOGASTRODUODENOSCOPY N/A 03/13/2015   Procedure: ESOPHAGOGASTRODUODENOSCOPY (EGD);  Surgeon: Lamar DASEN Holmes, MD;  Location: [facility] ENDOSCOPY;  Service: Endoscopy;  Laterality: N/A;   INTRAOPERATIVE TRANSTHORACIC ECHOCARDIOGRAM N/A  11/09/2019   Procedure: INTRAOPERATIVE TRANSTHORACIC ECHOCARDIOGRAM;  Surgeon: Verlin Lonni BIRCH, MD;  Location: [facility] OR;  Service: Open Heart Surgery;  Laterality: N/A;   PARATHYROIDECTOMY  11/20/2004   right superior parathyroidecomy for primary hyperparathyroidism/parathyroid adenoma   RIGHT/LEFT HEART CATH AND CORONARY ANGIOGRAPHY N/A 10/19/2019   Procedure: RIGHT/LEFT HEART CATH AND CORONARY ANGIOGRAPHY;  Surgeon: Mady Lonni, MD;  Location: [facility] INVASIVE CV LAB;  Service: Cardiovascular;  Laterality: N/A;   THYROIDECTOMY     TRANSCATHETER AORTIC VALVE REPLACEMENT, TRANSFEMORAL N/A 11/09/2019   Procedure: TRANSCATHETER AORTIC VALVE REPLACEMENT, TRANSFEMORAL;  Surgeon: Verlin Lonni BIRCH, MD;  Location: [facility] OR;  Service: Open Heart Surgery;  Laterality: N/A;    SOCIAL HISTORY: Social History   Socioeconomic History   Marital status: Divorced    Spouse name: Not on file   Number of children: 2   Years of education: 12   Highest education level: 12th grade  Occupational History   Occupation: retired-worked with nandicapped adults  Tobacco Use   Smoking status: Former    Current packs/day: 0.00    Types: Cigarettes    Start date: 09/05/1959    Quit date: 09/05/1979    Years since quitting: 44.3   Smokeless tobacco: Former  Building services engineer status: Never Used  Substance and Sexual Activity   Alcohol  use: Not Currently   Drug use: No   Sexual activity: Not on file  Other Topics Concern   Not on file  Social History Narrative   Not on file   Social Drivers of Health   Financial Resource Strain: Low Risk  (12/15/2023)   Overall Financial Resource Strain (CARDIA)    Difficulty of Paying Living Expenses: Not very hard  Food Insecurity: No Food Insecurity (12/15/2023)   Hunger Vital Sign    Worried About Running Out of Food in the Last Year: Never true    Ran Out of Food in the Last Year: Never true  Transportation Needs: No Transportation Needs (12/15/2023)   PRAPARE - Administrator, Civil Service (Medical): No    Lack of Transportation  (Non-Medical): No  Physical Activity: Unknown (12/15/2023)   Exercise Vital Sign    Days of Exercise per Week: 2 days    Minutes of Exercise per Session: Patient declined  Stress: No Stress Concern Present (12/15/2023)   [employer] of Occupational Health - Occupational Stress Questionnaire    Feeling of Stress: Not at all  Social Connections: Moderately Integrated (12/15/2023)   Social Connection and Isolation Panel    Frequency of Communication with Friends and Family: More than three times a week    Frequency of Social Gatherings with Friends and Family: More than three times a week    Attends Religious Services: More than 4 times per year    Active Member of [employer] or Organizations: Yes    Attends Banker Meetings: 1 to 4 times per year    Marital Status: Divorced  Catering manager Violence: Not At Risk (08/11/2023)   Humiliation, Afraid, Rape, and Kick questionnaire    Fear of Current or Ex-Partner: No    Emotionally Abused: No    Physically Abused: No    Sexually Abused: No    FAMILY HISTORY: Family History  Problem Relation Age of Onset   Parkinson's disease Mother    Heart disease Father    Lupus Sister    Aneurysm Son    Lupus Son    Breast cancer Maternal Aunt  mat great aunt    ALLERGIES:  is allergic to ferumoxytol , penicillins, iron , and other.  MEDICATIONS:  Current Outpatient Medications  Medication Sig Dispense Refill   Abatacept (ORENCIA [address]) Inject 1.75 Units into the skin once a week.     ACCU-CHEK GUIDE test strip USE TO CHECK BLOOD SUGAR TWICE DAILY 200 each 3   Accu-Chek Softclix Lancets lancets USE TO CHECK BLOOD GLUCOSE TWICE DAILY 200 each 2   acetaminophen  (TYLENOL ) 500 MG tablet Take 1,000 mg by mouth every 6 (six) hours as needed for moderate pain.      Artificial Tear Solution (GENTEAL TEARS) 0.1-0.2-0.3 % SOLN Place 1 drop into both eyes 3 (three) times daily as needed (dry/irritated eyes.).     aspirin  81 MG chewable  tablet Chew 1 tablet (81 mg total) by mouth daily.     B COMPLEX-C PO Take 10 mLs by mouth daily.     baclofen  (LIORESAL ) 10 MG tablet TAKE 1 TO 2 TABLETS BY MOUTH TWICE DAILY AS NEEDED FOR MUSCLE SPASM 60 tablet 5   Blood Glucose Monitoring Suppl (ACCU-CHEK GUIDE) w/Device KIT Use to check blood sugar twice per day 1 kit 0   Cholecalciferol (VITAMIN D3) 125 MCG (5000 UT) TABS Take 5,000 Units by mouth daily.     cyanocobalamin  1000 MCG tablet Take by mouth every other day.     dapagliflozin propanediol (FARXIGA) 10 MG TABS tablet Take 10 mg by mouth daily.     Ferrous Sulfate  (IRON ) 325 (65 Fe) MG TABS TAKE 1 TABLET BY MOUTH TWICE DAILY WITH A MEAL 180 each 1   furosemide  (LASIX ) 40 MG tablet Take 1 tablet (40 mg total) by mouth daily. (Patient taking differently: Take 20 mg by mouth daily as needed.) 90 tablet 3   hydrochlorothiazide  (HYDRODIURIL ) 25 MG tablet Take 1 tablet (25 mg total) by mouth daily. 90 tablet 3   hydroxychloroquine (PLAQUENIL) 200 MG tablet Take 1 tablet by mouth 2 (two) times daily.     lactobacillus acidophilus (BACID) TABS tablet Take 1 tablet by mouth daily as needed (digestive health.).     Lancets Misc. KIT Use to check blood sugar twice daily     losartan  (COZAAR ) 50 MG tablet Take 1 tablet (50 mg total) by mouth daily. 90 tablet 3   magnesium  oxide (MAG-OX) 400 MG tablet Take 400 mg by mouth daily.     mupirocin  ointment (BACTROBAN ) 2 % Apply 1 Application topically 2 (two) times daily. (Patient taking differently: Apply 1 Application topically 2 (two) times daily as needed.) 22 g 3   simvastatin  (ZOCOR ) 20 MG tablet TAKE 1 TABLET BY MOUTH ONCE DAILY AT  6PM 90 tablet 3   TRULICITY  1.5 MG/0.5ML SOAJ Inject 1.5 mg into the skin once a week. 8 mL 3   gabapentin  (NEURONTIN ) 100 MG capsule Start 1 capsule daily, increase by 1 cap every 2-3 days as tolerated up to 3 times a day, or may take 3 at once in evening. (Patient not taking: Reported on 01/09/2024) 90 capsule 2    levocetirizine (XYZAL ) 5 MG tablet TAKE 1 TABLET BY MOUTH ONCE DAILY IN THE EVENING (Patient not taking: Reported on 01/09/2024) 90 tablet 3   pantoprazole (PROTONIX) 40 MG tablet Take 40 mg by mouth daily. (Patient not taking: Reported on 01/09/2024)     No current facility-administered medications for this visit.    Patient is obese.  PHYSICAL EXAMINATION: ECOG PERFORMANCE STATUS: 1 - Symptomatic but completely ambulatory  Vitals:  01/09/24 1017  BP: 130/61  Pulse: 73  Resp: 16  Temp: (!) 96.8 F (36 C)  SpO2: 98%     Filed Weights   01/09/24 1017  Weight: 243 lb 6.4 oz (110.4 kg)      Physical Exam HENT:     Head: Normocephalic and atraumatic.     Mouth/Throat:     Pharynx: No oropharyngeal exudate.  Eyes:     Pupils: Pupils are equal, round, and reactive to light.  Cardiovascular:     Rate and Rhythm: Normal rate and regular rhythm.  Pulmonary:     Effort: Pulmonary effort is normal. No respiratory distress.     Breath sounds: Normal breath sounds. No wheezing.  Abdominal:     General: Bowel sounds are normal. There is no distension.     Palpations: Abdomen is soft. There is no mass.     Tenderness: There is no abdominal tenderness. There is no guarding or rebound.  Musculoskeletal:        General: No tenderness. Normal range of motion.     Cervical back: Normal range of motion and neck supple.  Skin:    General: Skin is warm.  Neurological:     Mental Status: She is alert and oriented to person, place, and time.  Psychiatric:        Mood and Affect: Affect normal.     Latest Reference Range & Units 01/11/21 10:08  Iron  28 - 170 ug/dL 899  UIBC ug/dL 787  TIBC 749 - 549 ug/dL 687  Saturation Ratios 10.4 - 31.8 % 32 (H)  Ferritin 11 - 307 ng/mL 196  (H): Data is abnormally high  LABORATORY DATA:  I have reviewed the data as listed Lab Results  Component Value Date   WBC 3.2 (L) 01/09/2024   HGB 10.7 (L) 01/09/2024   HCT 34.4 (L) 01/09/2024   MCV  96.6 01/09/2024   PLT 133 (L) 01/09/2024   Recent Labs    02/23/23 0402 06/10/23 0759 07/10/23 1002 01/09/24 1005  NA 138 143 141 139  K 4.2 4.4 4.3 4.2  CL 104 109 104 105  CO2 21* 26 26 25   GLUCOSE 131* 111* 101* 104*  BUN 31* 32* 45* 35*  CREATININE 1.32* 1.40* 1.39* 1.57*  CALCIUM 8.7* 9.2 9.0 9.2  GFRNONAA 42*  --  39* 34*  PROT 7.2 6.7  --   --   ALBUMIN 3.5  --   --   --   AST 32 24  --   --   ALT 19 17  --   --   ALKPHOS 55  --   --   --   BILITOT 1.2 0.3  --   --     ASSESSMENT & PLAN:  Normocytic anemia #Normocytic anemia unclear etiology; previous history of AVM iron  deficiency; September 2020-iron  studies negative for deficiency.  Bone marrow biopsy negative for any significant dyspoiesis.  Cytogenetics normal.  # Hemoglobin is 9-10 [chronic anemia-? Congential causes]-/? CKD vs others- no obvious evidence any severe iron  deficiency.                             # White count 3.0 chronic benign ethnic neutropenia-neutrophil count 1.4-/ mild thrombocytopenia > 100- monitor for now.   # Relative B12 deficiency-200; March 2023- 5000+- continue sublingual B12 tablets 2-3 times a week. Await B12 level from today.stable.SABRA  #Chronic kidney disease-stage III mild [ prior Nephrology-[facility]]-  recommend repeat evaluation with wants another nephrology group. referral to [facility]  # DISPOSITION: # referral to [facility] re: CKD # in 6 months-MD; labs- cbc/bmp/ iron  studies/ferritin/ b12---Dr.B     Cindy JONELLE Joe, MD 01/09/2024 11:05 AM

## 2024-01-09 NOTE — Progress Notes (Signed)
 C/o fatigue.  No other symptoms.

## 2024-01-09 NOTE — Addendum Note (Signed)
 Addended by: LAEL BROWNING A on: 01/09/2024 11:23 AM   Modules accepted: Orders

## 2024-01-19 ENCOUNTER — Other Ambulatory Visit: Admitting: Pharmacist

## 2024-01-19 DIAGNOSIS — E1169 Type 2 diabetes mellitus with other specified complication: Secondary | ICD-10-CM

## 2024-01-19 DIAGNOSIS — I10 Essential (primary) hypertension: Secondary | ICD-10-CM

## 2024-01-19 DIAGNOSIS — Z7985 Long-term (current) use of injectable non-insulin antidiabetic drugs: Secondary | ICD-10-CM

## 2024-01-19 DIAGNOSIS — I5032 Chronic diastolic (congestive) heart failure: Secondary | ICD-10-CM

## 2024-01-19 NOTE — Progress Notes (Unsigned)
 01/19/2024 Name: Diana Harmon MRN: [id number] DOB: 06-03-1945  No chief complaint on file.   Diana Harmon is a 78 y.o. year old female who presented for a telephone visit.   They were referred to the pharmacist by their PCP for assistance in managing diabetes, hypertension, hyperlipidemia, and medication access.      Subjective:   Care Team: Primary Care Provider: Edman Marsa PARAS, DO; Next Scheduled Visit: 06/24/2024 Rheumatologist: Diana Lady Plumb, MD; Next Scheduled Visit: 03/25/2024 Hematologist: Diana Cindy SAUNDERS, MD Nurse Care Manager: Diana Lima, RN; Next Scheduled Visit: 02/17/2024  Cardiologist: Diana Bruckner, MD GI Specialist: Diana Twyla Rocks, PA Nephrologist: Diana Gales, MD; Initial Visit Scheduled: Tomorrow  Medication Access/Adherence  Current Pharmacy:  [facility] [address] ([address]), [place] - [address] ([address]) [place] [address] Phone: [phone] Fax: [phone]  [facility] [address], [place] - [address] [place] [address] Phone: [phone] Fax: [phone]  [email or web address] - [place], [place] - [phone] [facility] [address] [facility] [address] [place] [address] Phone: [phone] Fax: [phone]  [facility] [place], [place] - [address] [place] [address] Phone: [phone] Fax: [phone]  [id number], [place] - [address] [facility] [address] [place] [address] Phone: [phone] Fax: [phone]   Patient reports affordability concerns with their medications: No Patient reports access/transportation concerns to their pharmacy: No  Patient reports adherence concerns with their medications:  No      Reports trialed gabapentin  100 mg as prescribed by PCP on 8/12 for nerve pain,  but stopped as noticed significant swelling in her legs and face. Reports swelling resolved with stopping gabapentin     Diabetes:   Current medications:  - Trulicity  1.5 mg weekly on Saturdays             Reports tolerating well; noticing appetite control benefit - Farxiga 10 mg daily    Medications tried in the past: metformin    Reports recent morning blood sugar readings ranging 88-121   Patient denies hypoglycemic s/sx including dizziness, shakiness, sweating.    Current physical activity: Reports exercise limited by arthritis; doing chair exercises for ~10 minutes x daily and walking for ~10 on treadmill at a time   Statin therapy: simvastatin  20 mg daily   Current medication access support:  - Enrolled in patient assistance for Trulicity  through [employer] through 05/05/2024 - Enrolled in patient assistance for Farxiga through [employer] through 05/05/2024      HTN/Chronic HFpEF :   Current medications:  ACEi/ARB/ARNI: losartan  50 mg daily SGLT2 inhibitor: Farxiga 10 mg daily (reports started ~1 month ago) Diuretic regimen: furosemide  40 mg - 1/2 (20 mg) daily as needed HCTZ 25 mg daily   Last checked blood pressure: - 9/15: AM: 122/54, HR 77 - 9/14: PM: 107/51, HR 83 - 9/6: AM: 126/52, HR 78; PM: 108/84, HR 80   Denies symptoms of dizziness or lightheadedness recently provided taking positional changes slowly     Reports monitoring daily weight. Reports elevating legs during the day and wearing compression stockings helps.   Current physical activity: Reports exercise limited by arthritis; doing chair exercises for ~10 minutes x daily and walking for ~10 on treadmill at a time   Current medication access support:  - Enrolled in patient assistance for Farxiga through [employer]  through 05/05/2024     Objective:  Lab Results  Component Value Date   HGBA1C 6.1 (A) 12/16/2023    Lab Results  Component Value Date   CREATININE 1.57 (H) 01/09/2024   BUN 35 (H) 01/09/2024    NA 139 01/09/2024   K 4.2 01/09/2024   CL 105 01/09/2024   CO2 25 01/09/2024    Lab Results  Component Value Date   CHOL 162 06/10/2023   HDL 87 06/10/2023   LDLCALC 61 06/10/2023   TRIG 63 06/10/2023   CHOLHDL 1.9 06/10/2023    Medications Reviewed Today   Medications were not reviewed in this encounter       Assessment/Plan:   Note adverse event of swelling with gabapentin  in patient's chart. Will let PCP know. Will also ask provider about referral to Dr. Maree at [facility], as previously discussed, as requested by patient  Diabetes: - Currently controlled - Reviewed long term cardiovascular and renal outcomes of uncontrolled blood sugar - Encouraged patient to have regular well-balanced meals throughout the day, while controlling carbohydrate portion sizes - Encouraged patient to continue to use blood sugar checks as feedback on dietary choices - Encouraged patient to continue to monitor home blood sugar, keep log and bring record with her to medical appointments - Patient to follow up with [employer] as needed for refills of Trulicity  and with [employer] as needed for refills of Farxiga     HTN/Chronic HFpEF : - Reviewed appropriate blood pressure monitoring technique and reviewed goal blood pressure - Have reviewed to weigh daily and when to contact PCP/cardiology with weight gain     Follow Up Plan: Clinical Pharmacist will follow up with patient by telephone on 02/23/2024 at 9:30 AM      Diana Harmon, PharmD, [facility] [phone]

## 2024-01-20 DIAGNOSIS — E1122 Type 2 diabetes mellitus with diabetic chronic kidney disease: Secondary | ICD-10-CM | POA: Diagnosis not present

## 2024-01-20 DIAGNOSIS — I10 Essential (primary) hypertension: Secondary | ICD-10-CM | POA: Diagnosis not present

## 2024-01-20 DIAGNOSIS — N1832 Chronic kidney disease, stage 3b: Secondary | ICD-10-CM | POA: Diagnosis not present

## 2024-01-21 ENCOUNTER — Other Ambulatory Visit: Payer: Self-pay | Admitting: Nephrology

## 2024-01-21 DIAGNOSIS — E1122 Type 2 diabetes mellitus with diabetic chronic kidney disease: Secondary | ICD-10-CM

## 2024-01-21 DIAGNOSIS — I10 Essential (primary) hypertension: Secondary | ICD-10-CM

## 2024-01-22 ENCOUNTER — Other Ambulatory Visit: Payer: Self-pay | Admitting: Family Medicine

## 2024-01-22 DIAGNOSIS — M5431 Sciatica, right side: Secondary | ICD-10-CM

## 2024-01-22 NOTE — Patient Instructions (Signed)
Goals Addressed             This Visit's Progress    Pharmacy Goals       Our goal A1c is less than 7%. This corresponds with fasting sugars less than 130 and 2 hour after meal sugars less than 180. Please keep a log of your results when checking your blood sugar  Our goal bad cholesterol, or LDL, is less than 70 . This is why it is important to continue taking your simvastatin  Please check your home blood pressure, keep a log of the results and bring this with you to your medical appointments.  Feel free to call me with any questions or concerns. I look forward to our next call!   Wallace Cullens, PharmD, Para March, CPP Clinical Pharmacist [facility] [phone]

## 2024-01-30 ENCOUNTER — Ambulatory Visit
Admission: RE | Admit: 2024-01-30 | Discharge: 2024-01-30 | Disposition: A | Discharge status: Home / Self Care | Source: Ambulatory Visit | Attending: Nephrology | Admitting: Nephrology

## 2024-01-30 DIAGNOSIS — I10 Essential (primary) hypertension: Secondary | ICD-10-CM | POA: Insufficient documentation

## 2024-01-30 DIAGNOSIS — E1122 Type 2 diabetes mellitus with diabetic chronic kidney disease: Secondary | ICD-10-CM | POA: Insufficient documentation

## 2024-01-30 DIAGNOSIS — N183 Chronic kidney disease, stage 3 unspecified: Secondary | ICD-10-CM | POA: Diagnosis not present

## 2024-02-06 ENCOUNTER — Encounter: Payer: Self-pay | Admitting: Family Medicine

## 2024-02-06 DIAGNOSIS — Z7960 Long term (current) use of unspecified immunomodulators and immunosuppressants: Secondary | ICD-10-CM | POA: Diagnosis not present

## 2024-02-06 DIAGNOSIS — E1169 Type 2 diabetes mellitus with other specified complication: Secondary | ICD-10-CM

## 2024-02-06 MED ORDER — DAPAGLIFLOZIN PROPANEDIOL 10 MG PO TABS: 10.0000 mg | ORAL_TABLET | Freq: Every day | ORAL | 3 refills | Status: DC

## 2024-02-09 MED ORDER — DAPAGLIFLOZIN PROPANEDIOL 10 MG PO TABS: 10.0000 mg | ORAL_TABLET | Freq: Every day | ORAL | 3 refills | Status: AC

## 2024-02-09 NOTE — Addendum Note (Signed)
 Addended by: ALANA FEND A on: 02/09/2024 08:59 AM   Modules accepted: Orders

## 2024-02-16 ENCOUNTER — Ambulatory Visit
Admission: RE | Admit: 2024-02-16 | Discharge: 2024-02-16 | Disposition: A | Discharge status: Home / Self Care | Source: Ambulatory Visit | Attending: Family Medicine | Admitting: Family Medicine

## 2024-02-16 VITALS — BP 139/58 | HR 86 | Temp 98.2°F | Resp 16 | Ht 62.0 in | Wt 240.0 lb

## 2024-02-16 DIAGNOSIS — Z8719 Personal history of other diseases of the digestive system: Secondary | ICD-10-CM

## 2024-02-16 DIAGNOSIS — R300 Dysuria: Secondary | ICD-10-CM

## 2024-02-16 DIAGNOSIS — R1030 Lower abdominal pain, unspecified: Secondary | ICD-10-CM

## 2024-02-16 LAB — COMPREHENSIVE METABOLIC PANEL WITH GFR
ALT: 30 U/L (ref 0–44)
AST: 35 U/L (ref 15–41)
Albumin: 3.5 g/dL (ref 3.5–5.0)
Alkaline Phosphatase: 74 U/L (ref 38–126)
Anion gap: 11 (ref 5–15)
BUN: 21 mg/dL (ref 8–23)
CO2: 26 mmol/L (ref 22–32)
Calcium: 9 mg/dL (ref 8.9–10.3)
Chloride: 99 mmol/L (ref 98–111)
Creatinine, Ser: 1.34 mg/dL — ABNORMAL HIGH (ref 0.44–1.00)
GFR, Estimated: 41 mL/min — ABNORMAL LOW (ref >60)
Glucose, Bld: 122 mg/dL — ABNORMAL HIGH (ref 70–99)
Potassium: 4.1 mmol/L (ref 3.5–5.1)
Sodium: 136 mmol/L (ref 135–145)
Total Bilirubin: 0.6 mg/dL (ref 0.0–1.2)
Total Protein: 8 g/dL (ref 6.5–8.1)

## 2024-02-16 LAB — LIPASE, BLOOD: Lipase: 77 U/L — ABNORMAL HIGH (ref 11–51)

## 2024-02-16 LAB — CBC WITH DIFFERENTIAL/PLATELET
Abs Immature Granulocytes: 0.01 K/uL (ref 0.00–0.07)
Basophils Absolute: 0 K/uL (ref 0.0–0.1)
Basophils Relative: 0 %
Eosinophils Absolute: 0.1 K/uL (ref 0.0–0.5)
Eosinophils Relative: 1 %
HCT: 34.9 % — ABNORMAL LOW (ref 36.0–46.0)
Hemoglobin: 11.2 g/dL — ABNORMAL LOW (ref 12.0–15.0)
Immature Granulocytes: 0 %
Lymphocytes Relative: 20 %
Lymphs Abs: 1.1 K/uL (ref 0.7–4.0)
MCH: 30.2 pg (ref 26.0–34.0)
MCHC: 32.1 g/dL (ref 30.0–36.0)
MCV: 94.1 fL (ref 80.0–100.0)
Monocytes Absolute: 0.7 K/uL (ref 0.1–1.0)
Monocytes Relative: 12 %
Neutro Abs: 3.7 K/uL (ref 1.7–7.7)
Neutrophils Relative %: 67 %
Platelets: 182 K/uL (ref 150–400)
RBC: 3.71 MIL/uL — ABNORMAL LOW (ref 3.87–5.11)
RDW: 13.2 % (ref 11.5–15.5)
WBC: 5.5 K/uL (ref 4.0–10.5)
nRBC: 0 % (ref 0.0–0.2)

## 2024-02-16 LAB — URINALYSIS, W/ REFLEX TO CULTURE (INFECTION SUSPECTED)
Bilirubin Urine: NEGATIVE
Glucose, UA: 500 mg/dL — AB
Hgb urine dipstick: NEGATIVE
Ketones, ur: NEGATIVE mg/dL
Nitrite: NEGATIVE
Protein, ur: 30 mg/dL — AB
RBC / HPF: NONE SEEN RBC/hpf (ref 0–5)
Specific Gravity, Urine: >1.03 — ABNORMAL HIGH (ref 1.005–1.030)
pH: 6 (ref 5.0–8.0)

## 2024-02-16 MED ORDER — CEFDINIR 300 MG PO CAPS: 300.0000 mg | ORAL_CAPSULE | Freq: Two times a day (BID) | ORAL | 0 refills | Status: DC

## 2024-02-16 NOTE — Discharge Instructions (Addendum)
 Your blood work was not far from your normal.   I sent your urine for culture to be sure the antibiotic prescribed will treat your potention infection. Someone may call you to change antibiotics. Stop by the pharmacy to pick up your prescriptions.  Follow up with your primary care provider or return to the urgent care, if not improving.

## 2024-02-16 NOTE — ED Provider Notes (Signed)
 [facility]    CSN: [id number] Arrival date & time: 02/16/24  0930      History   Chief Complaint Chief Complaint  Patient presents with   Abdominal Pain    HPI Emonnie Cannady Harmon is a 78 y.o. female.   HPI  History provided by patient and daughter   Diana Harmon presents for lower abdominal pain with painful urination that started a week ago. Non-watery stools about  2-3 days after that has resolved. This was her last bowel movement.   Yesterday, had sticky mucus with a pindrop of blood in it but no stool present. She ate some muscadine grapes with seeds.  Has history of diverticulitis.  Has been having some mucus come out when she has a bowel movement. No diarrhea, constipation or fever.   She ate some noodles last night but that didn't hurt her stool. Has not eaten very much in the past few days due to her abdominal discomfort but notes that when she does eat it doesn't bother her stomach.   Last night, she had the urge to urinate and defecate. Has some discomfort with urination and had a few episodes whereas only a small amount of urine came out.       Past Medical History:  Diagnosis Date   Anemia    Arthritis    legs, knees, back, wrists   Diabetes mellitus without complication (HCC)    Hypertension    Nausea    Patient is [name]    S/P TAVR (transcatheter aortic valve replacement) 11/09/2019   s/p TAVR with a 26 mm Edwards S3U via the TF approach by Dr. Verlin & Bartle   Severe aortic stenosis    SIRS (systemic inflammatory response syndrome) (HCC) 11/2019   Sjogren's disease    Thyroid  disease     Patient Active Problem List   Diagnosis Date Noted   Rheumatoid arthritis of multiple sites with negative rheumatoid factor (HCC) 03/06/2022   Protrusion of intervertebral disc of thoracic region 02/19/2021   Sjogren's syndrome with inflammatory arthritis 02/19/2021   S/P TAVR (transcatheter aortic valve replacement) 11/09/2019    Patient is [name]    Severe aortic stenosis    Thyroid  disease    Chronic heart failure with preserved ejection fraction (HFpEF) (HCC) 09/30/2019   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/22/2019   Arthralgia 04/22/2019   Normocytic anemia 02/17/2019   Primary osteoarthritis of right knee 02/06/2018   Morbid obesity (HCC) 11/05/2017   Neutropenia 04/24/2017   Vitamin D  deficiency 10/08/2016   Meniere's disease of right ear 07/04/2016   TIA (transient ischemic attack) 07/04/2016   Nonrheumatic aortic valve stenosis 07/04/2016   Seasonal allergies 01/30/2016   Recurrent UTI 10/26/2015   Iron  deficiency anemia due to chronic blood loss 02/28/2015   History of adenomatous polyp of colon 02/13/2015   Osteoarthritis of both knees 12/05/2014   Acid reflux 12/05/2014   Benign neoplasm of colon 12/05/2014   Type 2 diabetes mellitus with other specified complication (HCC) 12/05/2014   Essential hypertension 12/05/2014   Hyperlipidemia associated with type 2 diabetes mellitus (HCC) 12/05/2014   Arthritis, degenerative 12/05/2014   Anemia due to multiple mechanisms 12/05/2014   Abnormal liver enzymes 12/05/2014   Anemia 12/05/2014   Morbid (severe) obesity due to excess calories (HCC) 12/05/2014    Past Surgical History:  Procedure Laterality Date   CARDIAC CATHETERIZATION  10/19/2019   COLON SURGERY     caudarized polyps; rectal carcinoid excision ~ 2011  COLONOSCOPY     COLONOSCOPY WITH PROPOFOL  N/A 03/13/2015   Procedure: COLONOSCOPY WITH PROPOFOL ;  Surgeon: Lamar ONEIDA Holmes, MD;  Location: [facility] ENDOSCOPY;  Service: Endoscopy;  Laterality: N/A;   ESOPHAGOGASTRODUODENOSCOPY N/A 03/13/2015   Procedure: ESOPHAGOGASTRODUODENOSCOPY (EGD);  Surgeon: Lamar ONEIDA Holmes, MD;  Location: [facility] ENDOSCOPY;  Service: Endoscopy;  Laterality: N/A;   INTRAOPERATIVE TRANSTHORACIC ECHOCARDIOGRAM N/A 11/09/2019   Procedure: INTRAOPERATIVE TRANSTHORACIC ECHOCARDIOGRAM;  Surgeon:  Verlin Lonni BIRCH, MD;  Location[facility] OR;  Service: Open Heart Surgery;  Laterality: N/A;   PARATHYROIDECTOMY  11/20/2004   right superior parathyroidecomy for primary hyperparathyroidism/parathyroid adenoma   RIGHT/LEFT HEART CATH AND CORONARY ANGIOGRAPHY N/A 10/19/2019   Procedure: RIGHT/LEFT HEART CATH AND CORONARY ANGIOGRAPHY;  Surgeon: Mady Lonni, MD;  Location: [facility] INVASIVE CV LAB;  Service: Cardiovascular;  Laterality: N/A;   THYROIDECTOMY     TRANSCATHETER AORTIC VALVE REPLACEMENT, TRANSFEMORAL N/A 11/09/2019   Procedure: TRANSCATHETER AORTIC VALVE REPLACEMENT, TRANSFEMORAL;  Surgeon: Verlin Lonni BIRCH, MD;  Location: [facility] OR;  Service: Open Heart Surgery;  Laterality: N/A;    OB History   No obstetric history on file.      Home Medications    Prior to Admission medications   Medication Sig Start Date End Date Taking? Authorizing Provider  cefdinir  (OMNICEF ) 300 MG capsule Take 1 capsule (300 mg total) by mouth 2 (two) times daily. 02/16/24  Yes Dolce Sylvia, DO  Abatacept (ORENCIA [place]) Inject 1.75 Units into the skin once a week.    [provider]  ACCU-CHEK GUIDE test strip USE TO CHECK BLOOD SUGAR TWICE DAILY 08/20/22   Edman Marsa PARAS, DO  Accu-Chek Softclix Lancets lancets USE TO CHECK BLOOD GLUCOSE TWICE DAILY 08/20/22   Edman, Marsa PARAS, DO  acetaminophen  (TYLENOL ) 500 MG tablet Take 1,000 mg by mouth every 6 (six) hours as needed for moderate pain.     [provider]  Artificial Tear Solution (GENTEAL TEARS) 0.1-0.2-0.3 % SOLN Place 1 drop into both eyes 3 (three) times daily as needed (dry/irritated eyes.).    [provider]  aspirin  81 MG chewable tablet Chew 1 tablet (81 mg total) by mouth daily. 11/11/19   Sebastian Lamarr SAUNDERS, PA-C  B COMPLEX-C PO Take 10 mLs by mouth daily.    [provider]  baclofen  (LIORESAL ) 10 MG tablet TAKE 1 TO 2 TABLETS BY MOUTH TWICE DAILY AS NEEDED FOR MUSCLE SPASM 06/17/23    Edman, Marsa PARAS, DO  Blood Glucose Monitoring Suppl (ACCU-CHEK GUIDE) w/Device KIT Use to check blood sugar twice per day 02/05/22   Edman Marsa PARAS, DO  Cholecalciferol (VITAMIN D3) 125 MCG (5000 UT) TABS Take 5,000 Units by mouth daily.    [provider]  cyanocobalamin  1000 MCG tablet Take by mouth every other day.    [provider]  dapagliflozin propanediol (FARXIGA) 10 MG TABS tablet Take 1 tablet (10 mg total) by mouth daily. 02/09/24   [name], Marsa PARAS, DO  Ferrous Sulfate  (IRON ) 325 (65 Fe) MG TABS TAKE 1 TABLET BY MOUTH TWICE DAILY WITH A MEAL 06/05/18   [name], Marsa PARAS, DO  furosemide  (LASIX ) 40 MG tablet Take 1 tablet (40 mg total) by mouth daily. Patient taking differently: Take 40 mg by mouth daily as needed. 11/06/22   [name], Marsa PARAS, DO  hydrochlorothiazide  (HYDRODIURIL ) 25 MG tablet Take 1 tablet (25 mg total) by mouth daily. 06/17/23   [name], Marsa PARAS, DO  hydroxychloroquine (PLAQUENIL) 200 MG tablet Take 1 tablet by mouth 2 (two)  times daily. 02/19/21   [provider]  lactobacillus acidophilus (BACID) TABS tablet Take 1 tablet by mouth daily as needed (digestive health.).    [provider]  Lancets Misc. KIT Use to check blood sugar twice daily 11/18/19   [provider]  levocetirizine (XYZAL ) 5 MG tablet TAKE 1 TABLET BY MOUTH ONCE DAILY IN THE EVENING Patient not taking: Reported on 01/09/2024 12/28/19   Edman Marsa PARAS, DO  losartan  (COZAAR ) 50 MG tablet Take 1 tablet (50 mg total) by mouth daily. 06/17/23   [name], Marsa PARAS, DO  magnesium  oxide (MAG-OX) 400 MG tablet Take 400 mg by mouth daily.    [provider]  mupirocin  ointment (BACTROBAN ) 2 % Apply 1 Application topically 2 (two) times daily. Patient taking differently: Apply 1 Application topically 2 (two) times daily as needed. 10/29/21   [name], Marsa PARAS, DO  pantoprazole (PROTONIX) 40 MG  tablet Take 40 mg by mouth daily. Patient not taking: Reported on 01/09/2024    [provider]  simvastatin  (ZOCOR ) 20 MG tablet TAKE 1 TABLET BY MOUTH ONCE DAILY AT  6PM 06/17/23   [name], Marsa PARAS, DO  TRULICITY  1.5 MG/0.5ML SOAJ Inject 1.5 mg into the skin once a week. 06/18/23   [name], Marsa PARAS, DO  Cromolyn Sodium (NASAL ALLERGY NA) Place 1 spray into the nose daily as needed (allergies).  09/05/19  [provider]    Family History Family History  Problem Relation Age of Onset   Parkinson's disease Mother    Heart disease Father    Lupus Sister    Aneurysm Son    Lupus Son    Breast cancer Maternal Aunt        mat great aunt    Social History Social History   Tobacco Use   Smoking status: Former    Current packs/day: 0.00    Types: Cigarettes    Start date: 09/05/1959    Quit date: 09/05/1979    Years since quitting: 44.4   Smokeless tobacco: Former  Building services engineer status: Never Used  Substance Use Topics   Alcohol  use: Not Currently   Drug use: No     Allergies   Ferumoxytol , Penicillins, Gabapentin , Iron , and Other   Review of Systems Review of Systems :negative unless otherwise stated in HPI.      Physical Exam Triage Vital Signs ED Triage Vitals  Encounter Vitals Group     BP 02/16/24 0946 (!) 139/58     Girls Systolic BP Percentile --      Girls Diastolic BP Percentile --      Boys Systolic BP Percentile --      Boys Diastolic BP Percentile --      Pulse Rate 02/16/24 0946 86     Resp 02/16/24 0946 16     Temp 02/16/24 0946 98.2 F (36.8 C)     Temp Source 02/16/24 0946 Oral     SpO2 02/16/24 0946 98 %     Weight 02/16/24 0946 240 lb (108.9 kg)     Height 02/16/24 0946 5' 2 (1.575 m)     Head Circumference --      Peak Flow --      Pain Score 02/16/24 0951 8     Pain Loc --      Pain Education --      Exclude from Growth Chart --    No data found.  Updated Vital Signs BP (!) 139/58 (BP Location: Right  Wrist)   Pulse 86   Temp 98.2 F (36.8 C) (Oral)   Resp 16   Ht 5' 2 (1.575 m)   Wt 108.9 kg   SpO2 98%   BMI 43.90 kg/m   Visual Acuity Right Eye Distance:   Left Eye Distance:   Bilateral Distance:    Right Eye Near:   Left Eye Near:    Bilateral Near:     Physical Exam  GEN: pleasant well appearing elderly female, in no acute distress  CV: regular rate  RESP: no increased work of breathing, clear to ascultation bilaterally ABD: Bowel sounds present. Soft, lower abdominal tenderness to palpation, non-distended. No guarding, no rebound,no appreciable hepatosplenomegaly, no CVA tenderness, negative McBurney's, negative Murphy MSK: no extremity edema SKIN: warm, dry, no rash on visible skin NEURO: alert, moves all extremities appropriately PSYCH: Normal affect, appropriate speech and behavior   UC Treatments / Results  Labs (all labs ordered are listed, but only abnormal results are displayed) Labs Reviewed  URINALYSIS, W/ REFLEX TO CULTURE (INFECTION SUSPECTED) - Abnormal; Notable for the following components:      Result Value   Color, Urine AMBER (*)    APPearance HAZY (*)    Specific Gravity, Urine >1.030 (*)    Glucose, UA 500 (*)    Protein, ur 30 (*)    Leukocytes,Ua TRACE (*)    Bacteria, UA RARE (*)    All other components within normal limits  CBC WITH DIFFERENTIAL/PLATELET - Abnormal; Notable for the following components:   RBC 3.71 (*)    Hemoglobin 11.2 (*)    HCT 34.9 (*)    All other components within normal limits  COMPREHENSIVE METABOLIC PANEL WITH GFR - Abnormal; Notable for the following components:   Glucose, Bld 122 (*)    Creatinine, Ser 1.34 (*)    GFR, Estimated 41 (*)    All other components within normal limits  LIPASE, BLOOD - Abnormal; Notable for the following components:   Lipase 77 (*)    All other components within normal limits  URINE CULTURE    EKG  If EKG performed, see my interpretation and MDM section  Radiology No  results found.   Procedures Procedures (including critical care time)  Medications Ordered in UC Medications - No data to display  Initial Impression / Assessment and Plan / UC Course  I have reviewed the triage vital signs and the nursing notes.  Pertinent labs & imaging results that were available during my care of the patient were reviewed by me and considered in my medical decision making (see chart for details).      Patient is a  78 y.o. femalewith history recurrent UTIs, 28 diverticulitis, Sjogren syndrome, rheumatoid arthritis, morbid obesity, acid reflux, iron  deficiency anemia, osteoarthritis, hyperlipidemia, type 2 diabetes, hypertension who presents after having insidious lower abdominal pain for about a week with new onset dysuria in the past few days.  Overall, patient is well-appearing, well-hydrated, and in no acute distress.  Vital signs stable.  Annetteis afebrile.  Exam is not concerning for an acute abdomen despite having lower abdominal tenderness to palpation.  Obtained UA, CBC, CMP, and lipase.  No personal history of kidney stones.    DDX includes but not limited to: UTI, STI, cholecystitis, pancreatitis, gastroenteritis, nephrolithiasis, constipation, appendicitis, ovarian torsion/cyst    Lipase is slightly elevated at 77 and was higher previously.  CMP showing glucose 122, creatinine 1.34, GFR 41 otherwise normal.  She has type 2 diabetes and  CKD with history of abnormal liver enzymes and these results are not far from her baseline.  CMP showing no leukocytosis or leukopenia.  She does have some normocytic anemia that is stable at her baseline.  Otherwise CBC is unremarkable.  She has history of recurrent UTIs.  Urinalysis showing glucose urea, proteinuria trace leukocyte esterase with rare bacteria.  No hematuria seen on microscopy.  Urine culture obtained.  Doubt acute pancreatitis.  Though I do suspect an acute intra-abdominal pathology.  She was prescribed  cefdinir  300 mg twice daily for 7 days which will cover for possible diverticulitis flareup as well as possible urinary tract infection.  Follow-up, return and ED precautions given.  Discussed MDM, treatment plan and plan for follow-up with patient and her daughter who agree with plan.    Final Clinical Impressions(s) / UC Diagnoses   Final diagnoses:  Lower abdominal pain  Dysuria  History of diverticulitis     Discharge Instructions      Your blood work was not far from your normal.   I sent your urine for culture to be sure the antibiotic prescribed will treat your potention infection. Someone may call you to change antibiotics. Stop by the pharmacy to pick up your prescriptions.  Follow up with your primary care provider or return to the urgent care, if not improving.         ED Prescriptions     Medication Sig Dispense Auth. Provider   cefdinir  (OMNICEF ) 300 MG capsule Take 1 capsule (300 mg total) by mouth 2 (two) times daily. 14 capsule Diana Fotheringham, DO      PDMP not reviewed this encounter.   Diana Berth, DO 02/16/24 1702

## 2024-02-16 NOTE — ED Triage Notes (Signed)
 Pt c/o abd pain & diarrhea x6 days. States stool clear jelly like, noticed 1 spot of blood last night. Rectal pain at times.

## 2024-02-17 ENCOUNTER — Ambulatory Visit (HOSPITAL_COMMUNITY): Payer: Self-pay

## 2024-02-17 ENCOUNTER — Telehealth

## 2024-02-17 LAB — URINE CULTURE: Culture: NO GROWTH

## 2024-02-20 ENCOUNTER — Other Ambulatory Visit: Payer: Self-pay | Admitting: Pharmacist

## 2024-02-20 ENCOUNTER — Other Ambulatory Visit: Admitting: Pharmacist

## 2024-02-20 DIAGNOSIS — I5032 Chronic diastolic (congestive) heart failure: Secondary | ICD-10-CM

## 2024-02-20 DIAGNOSIS — Z7985 Long-term (current) use of injectable non-insulin antidiabetic drugs: Secondary | ICD-10-CM

## 2024-02-20 DIAGNOSIS — E1169 Type 2 diabetes mellitus with other specified complication: Secondary | ICD-10-CM

## 2024-02-20 NOTE — Progress Notes (Signed)
 02/20/2024 Name: [name] MRN: [id number] DOB: 1945-06-04  Chief Complaint  Patient presents with   Medication Assistance   Medication Management    Varonica Harmon is a 78 y.o. year old female who presented for a telephone visit.   They were referred to the pharmacist by their PCP for assistance in managing diabetes, hypertension, hyperlipidemia, and medication access.      Subjective:   Care Team: Primary Care Provider: Edman Marsa PARAS, DO; Next Scheduled Visit: 06/24/2024 Rheumatologist: Tobie Lady Plumb, MD; Next Scheduled Visit: 03/25/2024 Hematologist: Rennie Cindy SAUNDERS, MD Nurse Care Manager: Karoline Lima, RN; Next Scheduled Visit: 02/27/2024  Cardiologist: Mady Bruckner, MD GI Specialist: Samuella Twyla [place]ocks, PA Nephrologist: Marcelino Gales, MD; Next Scheduled Visit: 02/25/2024 Neurologist: Maree Jannett Hering, MD; Next Scheduled Visit: 03/01/2024  Medication Access/Adherence  Current Pharmacy:  [facility] [address] ([address]), [place] - [address] [place] ([place]) [place] [address] Phone: [phone] Fax: [phone]  [facility] [address] [place] - [address] [place] [address] Phone: [phone] Fax: [phone]  [email or web address] - [place], [place] - [address] [facility] [address] [place] [address] Phone: [phone] Fax: [phone]  [facility] - [place], [place] - [address] [place] [address] Phone: [phone] Fax: [phone]  [id number] [facility], [place] - [address] AT [facility] [address] [place] [address] Phone: [phone] Fax: [phone]  [facility] - [place], [place] - [address] [place] [address] Phone: [phone] Fax: [phone]   Patient reports  affordability concerns with their medications: No Patient reports access/transportation concerns to their pharmacy: No  Patient reports adherence concerns with their medications:  No     Per patient/from review of chart, note patient seen at [facility] on 02/16/2024 related to lower abdominal pain. She was prescribed cefdinir  300 mg twice daily for 7 days for possible diverticulitis flareup/possible urinary tract infection.    Today patient denies urinary symptoms and reports that abdominal pain is improved since started antibiotic.  Reports that she has had diarrhea since started on the cefdinir , but reports that this has improved some with taking doses with a full meal, rather than smaller snack. Daughter also reports has noticed improvement with providing bland foods (avoiding grease) - Note patient also takes a daily probiotic - Reports staying well hydrated  Diabetes:   Current medications:  - Trulicity  1.5 mg weekly on Saturdays             Reports tolerating well; noticing appetite control benefit - Farxiga 10 mg daily    Medications tried in the past: metformin    Reports recent blood sugar readings ranging 108-177 throughout the day   Patient denies hypoglycemic s/sx including dizziness, shakiness, sweating.      Statin therapy: simvastatin  20 mg daily   Current medication access support:  - Enrolled in patient assistance for Trulicity  through [employer] through 05/05/2024 - Enrolled in patient assistance for Farxiga through [employer] through 05/05/2024   Reports has contacted [employer] regarding re-enrollment and she is re-enrolled through 05/05/2025   HTN/Chronic HFpEF :   Current medications:  ACEi/ARB/ARNI: losartan  50 mg daily SGLT2 inhibitor: Farxiga 10 mg daily Diuretic regimen: furosemide  40 mg - 1/2 (20 mg) daily as needed HCTZ 25  mg daily   Last checked blood pressure: - 10/15: 134/50, HR 80   Denies symptoms of dizziness or lightheadedness  recently provided taking positional changes slowly     Patient monitors daily weight    Current medication access support:  - Enrolled in patient assistance for Farxiga through [employer] through 05/05/2024   Reports has contacted [employer] regarding re-enrollment and she is re-enrolled through 05/05/2025  Objective:  Lab Results  Component Value Date   HGBA1C 6.1 (A) 12/16/2023    Lab Results  Component Value Date   CREATININE 1.34 (H) 02/16/2024   BUN 21 02/16/2024   NA 136 02/16/2024   K 4.1 02/16/2024   CL 99 02/16/2024   CO2 26 02/16/2024    Lab Results  Component Value Date   CHOL 162 06/10/2023   HDL 87 06/10/2023   LDLCALC 61 06/10/2023   TRIG 63 06/10/2023   CHOLHDL 1.9 06/10/2023   BP Readings from Last 3 Encounters:  02/16/24 (!) 139/58  01/09/24 130/61  12/16/23 124/60   Pulse Readings from Last 3 Encounters:  02/16/24 86  01/09/24 73  12/16/23 80     Medications Reviewed Today     Reviewed by Alana Sharyle LABOR, RPH-CPP (Pharmacist) on 02/20/24 at 1456  Med List Status: <None>   Medication Order Taking? Sig Documenting Provider Last Dose Status Informant  Abatacept [facility]) [id number]  Inject 1.75 Units into the skin once a week. [provider]  Active   ACCU-CHEK GUIDE test strip [id number]  USE TO CHECK BLOOD SUGAR TWICE DAILY Edman Marsa PARAS, DO  Active   Accu-Chek Softclix Lancets lancets [id number]  USE TO CHECK BLOOD GLUCOSE TWICE DAILY Edman, Marsa PARAS, DO  Active   acetaminophen  (TYLENOL ) 500 MG tablet [id number]  Take 1,000 mg by mouth every 6 (six) hours as needed for moderate pain.  [provider]  Active Child  Artificial Tear Solution (GENTEAL TEARS) 0.1-0.2-0.3 % SOLN [id number]  Place 1 drop into both eyes 3 (three) times daily as needed (dry/irritated eyes.). [provider]  Active Child  aspirin  81 MG chewable tablet [id number]  Chew 1 tablet (81 mg total) by mouth daily. [name]  Active Child  B COMPLEX-C PO [id number]  Take 10 mLs by mouth daily. [provider]  Active Child  baclofen  (LIORESAL ) 10 MG tablet [id number]  TAKE 1 TO 2 TABLETS BY MOUTH TWICE DAILY AS NEEDED FOR MUSCLE SPASM Edman, Marsa PARAS, DO  Active   Blood Glucose Monitoring Suppl (ACCU-CHEK GUIDE) w/Device KIT [id number]  Use to check blood sugar twice per day Edman Marsa PARAS, DO  Active   cefdinir  (OMNICEF ) 300 MG capsule [id number]  Take 1 capsule (300 mg total) by mouth 2 (two) times daily. Brimage, Vondra, DO  Active   Cholecalciferol (VITAMIN D3) 125 MCG (5000 UT) TABS [id number]  Take 5,000 Units by mouth daily. [provider]  Active Child    Discontinued 09/05/19 1316   cyanocobalamin  1000 MCG tablet [id number]  Take by mouth every other day. [provider]  Active            Med Note [place], [facility]   Mon Jan 13, 2023 10:15 AM)    dapagliflozin propanediol (FARXIGA) 10 MG TABS tablet [id number] Yes Take 1 tablet (10 mg total) by mouth daily. Edman Marsa PARAS, DO  Active   Ferrous Sulfate  (IRON ) 325 (65 Fe) MG TABS [id number]  TAKE 1 TABLET BY MOUTH TWICE DAILY WITH A MEAL  Edman Marsa PARAS, DO  Active Child  furosemide  (LASIX ) 40 MG tablet [id number]  Take 1 tablet (40 mg total) by mouth daily.  Patient taking differently: Take 40 mg by mouth daily as needed.   Edman Marsa PARAS, DO  Active   hydrochlorothiazide  (HYDRODIURIL ) 25 MG tablet [id number]  Take 1 tablet (25 mg total) by mouth daily. Edman Marsa PARAS, DO  Active   hydroxychloroquine (PLAQUENIL) 200 MG tablet [id number]  Take 1 tablet by mouth 2 (two) times daily. [provider]  Active   lactobacillus acidophilus (BACID) TABS tablet [id number]  Take 1 tablet by mouth daily as needed (digestive health.). [provider]  Active            Med Note SOILA LYLE BROCKS   Wed Nov 03, 2019  3:06 PM)    Lancets Misc. KIT [id number]  Use to check blood  sugar twice daily [provider]  Active   levocetirizine (XYZAL ) 5 MG tablet [id number]  TAKE 1 TABLET BY MOUTH ONCE DAILY IN THE EVENING  Patient not taking: Reported on 01/09/2024   Edman Marsa PARAS, DO  Active   losartan  (COZAAR ) 50 MG tablet [id number]  Take 1 tablet (50 mg total) by mouth daily. Edman Marsa PARAS, DO  Active   magnesium  oxide (MAG-OX) 400 MG tablet [id number]  Take 400 mg by mouth daily. [provider]  Active   mupirocin  ointment (BACTROBAN ) 2 % [id number]  Apply 1 Application topically 2 (two) times daily.  Patient taking differently: Apply 1 Application topically 2 (two) times daily as needed.   [name], Marsa PARAS, DO  Active   pantoprazole (PROTONIX) 40 MG tablet [id number]  Take 40 mg by mouth daily.  Patient not taking: Reported on 01/09/2024   [provider]  Active   simvastatin  (ZOCOR ) 20 MG tablet [id number]  TAKE 1 TABLET BY MOUTH ONCE DAILY AT  [address] DO  Active   TRULICITY  1.5 MG/0.5ML EMMANUEL [id number] Yes Inject 1.5 mg into the skin once a week. Edman Marsa PARAS, DO  Active               Assessment/Plan:   Provide counseling on cefdinir  antibiotic, including encouraging patient to complete full course even if feeling better sooner - Confirms separating iron  and magnesium  supplements from cefdinir  doses   Encourage patient to continue to stay well hydrated  Patient to follow guidance from Urgent Care provider regarding follow-up, return and ED precautions from 02/16/2024 visit, if needed  Review sick day rules for Farxiga (if a day with significantly reduced oral intake, serious vomiting, or diarrhea, hold SGLT2). Patient/daughter verbalized understanding.      Diabetes: - Currently controlled - Have reviewed long term cardiovascular and renal outcomes of uncontrolled blood sugar - Encouraged patient to have regular well-balanced meals throughout the day, while controlling  carbohydrate portion sizes - Encouraged patient to continue to use blood sugar checks as feedback on dietary choices - Patient to continue to monitor home blood sugar, keep log and bring record with her to medical appointments - Patient to follow up with [employer] as needed for refills of Trulicity  and with [employer] as needed for refills of Farxiga - Will collaborate with CPhT Suzen Mall to request that she support patient with re-enrollment in Trulicity  patient assistance program     HTN/Chronic HFpEF : - Reviewed appropriate blood pressure monitoring technique and reviewed goal blood pressure - Have reviewed to weigh daily and when to contact PCP/cardiology with  weight gain - Reviewed instruction provided by PCP at latest office visit that If has further symptoms of hypotension, such as any lightheadedness, may reduce hydrochlorothiazide  dose by half.  - Recommend to monitor home blood pressure, keep log of results and have this record to review at upcoming medical appointments. Patient to contact provider office sooner if needed for readings outside of established parameters or symptoms     Follow Up Plan: Clinical Pharmacist will follow up with patient by telephone on 03/22/2024 at 9:00 AM       Sharyle Sia, PharmD, [facility] [phone]

## 2024-02-20 NOTE — Patient Instructions (Signed)
Goals Addressed             This Visit's Progress    Pharmacy Goals       Our goal A1c is less than 7%. This corresponds with fasting sugars less than 130 and 2 hour after meal sugars less than 180. Please keep a log of your results when checking your blood sugar  Our goal bad cholesterol, or LDL, is less than 70 . This is why it is important to continue taking your simvastatin  Please check your home blood pressure, keep a log of the results and bring this with you to your medical appointments.  Feel free to call me with any questions or concerns. I look forward to our next call!   Wallace Cullens, PharmD, Para March, CPP Clinical Pharmacist [facility] [phone]

## 2024-02-23 ENCOUNTER — Other Ambulatory Visit

## 2024-02-23 ENCOUNTER — Telehealth: Payer: Self-pay

## 2024-02-23 NOTE — Telephone Encounter (Signed)
 PAP: Patient assistance application for Farxiga through [employer] ([employer]) has been mailed to pt's home address on file. Provider portion of application will be faxed to provider's office. Patient cond. Approved for 2026 renewal.

## 2024-02-25 DIAGNOSIS — E1122 Type 2 diabetes mellitus with diabetic chronic kidney disease: Secondary | ICD-10-CM | POA: Diagnosis not present

## 2024-02-25 DIAGNOSIS — I10 Essential (primary) hypertension: Secondary | ICD-10-CM | POA: Diagnosis not present

## 2024-02-25 DIAGNOSIS — Z7985 Long-term (current) use of injectable non-insulin antidiabetic drugs: Secondary | ICD-10-CM | POA: Diagnosis not present

## 2024-02-25 DIAGNOSIS — N1831 Chronic kidney disease, stage 3a: Secondary | ICD-10-CM | POA: Diagnosis not present

## 2024-02-25 NOTE — Telephone Encounter (Signed)
 PAP: Patient assistance application for Farxiga has been approved by PAP Companies: [employer] from 05/06/2024 to 05/05/2025. Medication should be delivered to PAP Delivery: Home. For further shipping updates, please contact [employer] ([employer]) at [phone]. Patient ID is: Not provided

## 2024-02-27 ENCOUNTER — Other Ambulatory Visit: Payer: Self-pay

## 2024-02-27 NOTE — Patient Outreach (Signed)
 Complex Care Management   Visit Note  02/27/2024  Name:  Diana Harmon MRN: [id number] DOB: 1946-02-27  Situation: Referral received for Complex Care Management related to Heart Failure, Hypertension and Diabetes. I obtained verbal consent from Patient.  Visit completed with Diana Harmon via telephone.  Background:   Past Medical History:  Diagnosis Date   Anemia    Arthritis    legs, knees, back, wrists   Diabetes mellitus without complication (HCC)    Hypertension    Nausea    Patient is [name]    S/P TAVR (transcatheter aortic valve replacement) 11/09/2019   s/p TAVR with a 26 mm Edwards S3U via the TF approach by Dr. Verlin & Bartle   Severe aortic stenosis    SIRS (systemic inflammatory response syndrome) (HCC) 11/2019   Sjogren's disease    Thyroid  disease     Assessment: Patient Reported Symptoms: Cognitive Cognitive Status: Alert and oriented to person, place, and time, Normal speech and language skills Cognitive/Intellectual Conditions Management [RPT]: None reported or documented in medical history or problem list Health Maintenance Behaviors: Annual physical exam, Healthy diet, Hobbies, Social activities, Spiritual practice(s), Sleep adequate Healing Pattern: Average Health Facilitated by: Healthy diet, Prayer/meditation, Rest  Neurological Neurological Review of Symptoms: No symptoms reported Oher Neurological Symptoms/Conditions [RPT]: Denies symptoms at time of call. Neurological Self-Management Outcome: 4 (good)  HEENT HEENT Symptoms Reported: No symptoms reported HEENT Self-Management Outcome: 4 (good)  Cardiovascular Cardiovascular Symptoms Reported: No symptoms reported Does patient have uncontrolled Hypertension?: No Do You Have a Working Readable Scale?: Yes Weight: 235 lb (106.6 kg) Cardiovascular Self-Management Outcome: 4 (good)  Respiratory Respiratory Symptoms Reported: No symptoms reported Respiratory Management Strategies:  Routine screening Respiratory Self-Management Outcome: 4 (good)  Endocrine Endocrine Symptoms Reported: No symptoms reported Is patient diabetic?: Yes Is patient checking blood sugars at home?: Yes List most recent blood sugar readings, include date and time of day: 86 Endocrine Self-Management Outcome: 4 (good)  Gastrointestinal Gastrointestinal Symptoms Reported: No symptoms reported Gastrointestinal Self-Management Outcome: 4 (good)  Genitourinary Genitourinary Symptoms Reported: No symptoms reported Genitourinary Self-Management Outcome: 4 (good)  Integumentary Integumentary Symptoms Reported: No symptoms reported Skin Self-Management Outcome: 5 (very good)  Musculoskeletal Musculoskelatal Symptoms Reviewed: No symptoms reported Musculoskeletal Self-Management Outcome: 4 (good)  Psychosocial Psychosocial Symptoms Reported: No symptoms reported Quality of Family Relationships: supportive, involved, helpful Do you feel physically threatened by others?: No    03/01/2024    PHQ2-9 Depression Screening   Little interest or pleasure in doing things Not at all  Feeling down, depressed, or hopeless Not at all  PHQ-2 - Total Score 0    Vitals:   02/27/24 1023  BP: (!) 138/59    Medications Reviewed Today     Reviewed by Diana Lima, RN (Registered Nurse) on 02/27/24 at 1004  Med List Status: <None>   Medication Order Taking? Sig Documenting Provider Last Dose Status Informant  Abatacept [facility] [place]) [id number]  Inject 1.75 Units into the skin once a week. [provider]  Active   ACCU-CHEK GUIDE test strip [id number]  USE TO CHECK BLOOD SUGAR TWICE DAILY Edman Marsa PARAS, DO  Active   Accu-Chek Softclix Lancets lancets [id number]  USE TO CHECK BLOOD GLUCOSE TWICE DAILY Edman, Marsa PARAS, DO  Active   acetaminophen  (TYLENOL ) 500 MG tablet [id number]  Take 1,000 mg by mouth every 6 (six) hours as needed for moderate pain.  [provider]  Active  Child  Artificial Tear Solution (GENTEAL TEARS) 0.1-0.2-0.3 %  SOLN [id number]  Place 1 drop into both eyes 3 (three) times daily as needed (dry/irritated eyes.). [provider]  Active Child  aspirin  81 MG chewable tablet [id number]  Chew 1 tablet (81 mg total) by mouth daily. [name]  Active Child  B COMPLEX-C PO [id number]  Take 10 mLs by mouth daily. [provider]  Active Child  baclofen  (LIORESAL ) 10 MG tablet [id number]  TAKE 1 TO 2 TABLETS BY MOUTH TWICE DAILY AS NEEDED FOR MUSCLE SPASM Edman, Marsa PARAS, DO  Active   Blood Glucose Monitoring Suppl (ACCU-CHEK GUIDE) w/Device KIT [id number]  Use to check blood sugar twice per day Edman Marsa PARAS, DO  Active   cefdinir  (OMNICEF ) 300 MG capsule [id number]  Take 1 capsule (300 mg total) by mouth 2 (two) times daily. Brimage, Vondra, DO  Active   Cholecalciferol (VITAMIN D3) 125 MCG (5000 UT) TABS [id number]  Take 5,000 Units by mouth daily. [provider]  Active Child    Discontinued 09/05/19 1316   cyanocobalamin  1000 MCG tablet [id number]  Take by mouth every other day. [provider]  Active            Med Note [place], [facility] A   Mon Jan 13, 2023 10:15 AM)    dapagliflozin propanediol (FARXIGA) 10 MG TABS tablet [id number]  Take 1 tablet (10 mg total) by mouth daily. Edman Marsa PARAS, DO  Active   Ferrous Sulfate  (IRON ) 325 (65 Fe) MG TABS [id number]  TAKE 1 TABLET BY MOUTH TWICE DAILY WITH A MEAL [name], Marsa PARAS, DO  Active Child  furosemide  (LASIX ) 40 MG tablet [id number] Yes Take 1 tablet (40 mg total) by mouth daily.  Patient taking differently: Take 40 mg by mouth daily as needed for fluid or edema.   Edman Marsa PARAS, DO  Active   hydrochlorothiazide  (HYDRODIURIL ) 25 MG tablet [id number]  Take 1 tablet (25 mg total) by mouth daily. Edman Marsa PARAS, DO  Active   hydroxychloroquine (PLAQUENIL) 200 MG tablet [id number]  Take 1 tablet by mouth  2 (two) times daily. [provider]  Active   lactobacillus acidophilus (BACID) TABS tablet [id number]  Take 1 tablet by mouth daily as needed (digestive health.). [provider]  Active            Med Note SOILA LYLE BROCKS   Wed Nov 03, 2019  3:06 PM)    Lancets Misc. KIT [id number]  Use to check blood sugar twice daily [provider]  Active   levocetirizine (XYZAL ) 5 MG tablet [id number]  TAKE 1 TABLET BY MOUTH ONCE DAILY IN THE EVENING  Patient not taking: Reported on 01/09/2024   Edman Marsa PARAS, DO  Active   losartan  (COZAAR ) 50 MG tablet [id number]  Take 1 tablet (50 mg total) by mouth daily. Edman Marsa PARAS, DO  Active   magnesium  oxide (MAG-OX) 400 MG tablet [id number]  Take 400 mg by mouth daily. [provider]  Active   mupirocin  ointment (BACTROBAN ) 2 % [id number]  Apply 1 Application topically 2 (two) times daily.  Patient taking differently: Apply 1 Application topically 2 (two) times daily as needed.   [name], Marsa PARAS, DO  Active   pantoprazole (PROTONIX) 40 MG tablet [id number]  Take 40 mg by mouth daily.  Patient not taking: Reported on 01/09/2024   [provider]  Active   simvastatin  (ZOCOR ) 20 MG tablet [id number]  TAKE 1 TABLET BY MOUTH ONCE DAILY AT  JEREL Edman, Alexander  J, DO  Active   TRULICITY  1.5 MG/0.5ML SOAJ [id number]  Inject 1.5 mg into the skin once a week. Edman Marsa PARAS, DO  Active             Recommendation:   Continue Current Plan of Care  Follow Up Plan:   Telephone follow up appointment with Nurse Case Manager on March 29, 2024   Jackson Acron [facility] RN Care Manager Direct Dial: [phone]  Fax: [phone] Website: [email or web address]

## 2024-02-27 NOTE — Patient Instructions (Addendum)
 Thank you for allowing the Complex Care Management team to participate in your care. It was great speaking with you!  Keep up the great working managing your care!  We will follow up on March 29, 2024 at 1030. Please do not hesitate to contact me if you require assistance prior to our next outreach.   Jackson Acron [facility] Population Health RN Care Manager Direct Dial: [phone]  Fax: [phone] Website: [email or web address]

## 2024-03-22 ENCOUNTER — Telehealth: Payer: Self-pay

## 2024-03-22 ENCOUNTER — Other Ambulatory Visit: Admitting: Pharmacist

## 2024-03-22 DIAGNOSIS — E1169 Type 2 diabetes mellitus with other specified complication: Secondary | ICD-10-CM

## 2024-03-22 DIAGNOSIS — I10 Essential (primary) hypertension: Secondary | ICD-10-CM

## 2024-03-22 DIAGNOSIS — Z7985 Long-term (current) use of injectable non-insulin antidiabetic drugs: Secondary | ICD-10-CM

## 2024-03-22 NOTE — Progress Notes (Signed)
 03/22/2024 Name: Diana Harmon MRN: [id number] DOB: May 10, 1945  Chief Complaint  Patient presents with   Medication Management   Medication Assistance    Diana Harmon is a 78 y.o. year old female who presented for a telephone visit.   They were referred to the pharmacist by their PCP for assistance in managing diabetes, hypertension, hyperlipidemia, and medication access.      Subjective:   Care Team: Primary Care Provider: Edman Marsa PARAS, DO; Next Scheduled Visit: 06/24/2024 Rheumatologist: Tobie Lady Plumb, MD; Next Scheduled Visit: 03/25/2024 Hematologist: Rennie Cindy SAUND[place], MD Nurse Care Manager: Karoline Lima, RN; Next Scheduled Visit: 03/29/2024  Cardiologist: Mady Bruckner, MD GI Specialist: Samuella Twyla [place], PA Nephrologist: Marcelino Gales, MD; Next Scheduled Visit: 06/28/2024 Neurologist: Maree Jannett Hering, MD; Next Scheduled Visit: 04/05/2024  Medication Access/Adherence  Current Pharmacy:  [facility] [address] ([address]), [place] - [address] [place] ([place]) [place] [address] Phone: [phone] Fax: [phone]  [facility] [address] [place] - [address] [place] [address] Phone: [phone] Fax: [phone]  [email or web address] - [place], [place] - [address] [facility] [address] [place] [address] Phone: [phone] Fax: [phone]  [facility] [place], [place] - [address] [place] [address] Phone: [phone] Fax: [phone]  [id number] [place] - [address] AT [facility] [address] [place] [address] Phone: [phone] Fax: [phone]  [facility] [place], [place] - [address] [place] [address] Phone: [phone] Fax: [phone]   Patient reports  affordability concerns with their medications: No Patient reports access/transportation concerns to their pharmacy: No  Patient reports adherence concerns with their medications:  No     Today shares that she was seen by Gynecologist on 03/09/2024 for symptoms of yeast infection and Dr. Lovetta prescribed fluconazole  150 mg now and may repeat in 1 week  - Reports completed this first dose ~11/4 and symptoms **  Just starting to notice some symptoms of itching again   Diabetes:   Current medications:  - Trulicity  1.5 mg weekly on Saturdays             Reports tolerating well; noticing appetite control benefit - Farxiga 10 mg daily    Medications tried in the past: metformin    Reports recent blood sugar readings ranging 94-165 throughout the day   Patient denies hypoglycemic s/sx including dizziness, shakiness, sweating.      Statin therapy: simvastatin  20 mg daily   Current medication access support:  - Enrolled in patient assistance for Trulicity  through [employer] through 05/05/2024 - Enrolled in patient assistance for Farxiga through [employer] through 05/05/2025    HTN/Chronic HFpEF :   Current medications:  ACEi/ARB/ARNI: losartan  50 mg daily SGLT2 inhibitor: Farxiga 10 mg daily Diuretic regimen: furosemide  40 mg - 1/2 (20 mg) daily as needed HCTZ 25 mg daily   Last checked blood pressure: - Today: 148/58, HR 92 (before medicine) - 11/15: 128/55, HR 80  Denies symptoms of dizziness or lightheadedness recently provided taking positional changes slowly  Denies any signs of swelling     Patient monitors daily weight; reports remaining stable between 234-235 lbs  Current Physical Activity: Reports working with Physical therapy on Mondays and Wednesdays to address leg pain/strength and doing exercises from PT  on other days  Reports has noticed improvement in walking since started working with PT    Current medication access support:  - Enrolled in patient assistance for  Farxiga through [employer] through 05/05/2025   Objective:  Lab Results  Component Value Date   HGBA1C 6.1 (A) 12/16/2023    Lab Results  Component Value Date   CREATININE 1.34 (H) 02/16/2024   BUN 21 02/16/2024   NA 136 02/16/2024   K 4.1 02/16/2024   CL 99 02/16/2024   CO2 26 02/16/2024    Lab Results  Component Value Date   CHOL 162 06/10/2023   HDL 87 06/10/2023   LDLCALC 61 06/10/2023   TRIG 63 06/10/2023   CHOLHDL 1.9 06/10/2023   BP Readings from Last 3 Encounters:  02/27/24 (!) 138/59  02/16/24 (!) 139/58  01/09/24 130/61   Pulse Readings from Last 3 Encounters:  02/16/24 86  01/09/24 73  12/16/23 80     Medications Reviewed Today     Reviewed by Alana Sharyle LABOR, RPH-CPP (Pharmacist) on 03/22/24 at [phone]  Med List Status: <None>   Medication Order Taking? Sig Documenting Provider Last Dose Status Informant  Abatacept [facility]) [id number]  Inject 1.75 Units into the skin once a week. [provider]  Active   ACCU-CHEK GUIDE test strip [id number]  USE TO CHECK BLOOD SUGAR TWICE DAILY Edman Marsa PARAS, DO  Active   Accu-Chek Softclix Lancets lancets [id number]  USE TO CHECK BLOOD GLUCOSE TWICE DAILY Edman, Marsa PARAS, DO  Active   acetaminophen  (TYLENOL ) 500 MG tablet [id number]  Take 1,000 mg by mouth every 6 (six) hours as needed for moderate pain.  [provider]  Active Child  Artificial Tear Solution (GENTEAL TEARS) 0.1-0.2-0.3 % SOLN [id number]  Place 1 drop into both eyes 3 (three) times daily as needed (dry/irritated eyes.). [provider]  Active Child  aspirin  81 MG chewable tablet [id number]  Chew 1 tablet (81 mg total) by mouth daily. [name]  Active Child  B COMPLEX-C PO [id number]  Take 10 mLs by mouth daily. [provider]  Active Child  baclofen  (LIORESAL ) 10 MG tablet [id number]  TAKE 1 TO 2 TABLETS BY MOUTH TWICE DAILY AS NEEDED FOR MUSCLE SPASM [name], Marsa PARAS, DO   Active   Blood Glucose Monitoring Suppl (ACCU-CHEK GUIDE) w/Device KIT [id number]  Use to check blood sugar twice per day Edman Marsa PARAS, DO  Active     Discontinued 03/22/24 [phone] (Completed Course)   Cholecalciferol (VITAMIN D3) 125 MCG (5000 UT) TABS [id number]  Take 5,000 Units by mouth daily. [provider]  Active Child    Discontinued 09/05/19 1316   cyanocobalamin  1000 MCG tablet [id number]  Take by mouth every other day. [provider]  Active            Med Note [place], [facility] A   Mon Jan 13, 2023 10:15 AM)    dapagliflozin propanediol (FARXIGA) 10 MG TABS tablet [id number] Yes Take 1 tablet (10 mg total) by mouth daily. Edman Marsa PARAS, DO  Active   Ferrous Sulfate  (IRON ) 325 (65 Fe) MG TABS [id number]  TAKE 1 TABLET BY MOUTH TWICE DAILY WITH A MEAL [name], Marsa PARAS, DO  Active Child  furosemide  (LASIX ) 40 MG tablet [id number]  Take 1 tablet (40 mg total) by mouth daily.  Patient taking differently: Take 40 mg by mouth daily as needed for fluid or edema.   Edman Marsa PARAS, DO  Active  hydrochlorothiazide  (HYDRODIURIL ) 25 MG tablet [id number] Yes Take 1 tablet (25 mg total) by mouth daily. Edman Marsa PARAS, DO  Active   hydroxychloroquine (PLAQUENIL) 200 MG tablet [id number]  Take 1 tablet by mouth 2 (two) times daily. [provider]  Active   lactobacillus acidophilus (BACID) TABS tablet [id number]  Take 1 tablet by mouth daily as needed (digestive health.). [provider]  Active            Med Note SOILA LYLE BROCKS   Wed Nov 03, 2019  3:06 PM)    Lancets Misc. KIT [id number]  Use to check blood sugar twice daily [provider]  Active   levocetirizine (XYZAL ) 5 MG tablet [id number]  TAKE 1 TABLET BY MOUTH ONCE DAILY IN THE EVENING  Patient not taking: Reported on 01/09/2024   Edman Marsa PARAS, DO  Active   losartan  (COZAAR ) 50 MG tablet [id number] Yes Take 1 tablet (50 mg total) by mouth  daily. Edman Marsa PARAS, DO  Active   magnesium  oxide (MAG-OX) 400 MG tablet [id number]  Take 400 mg by mouth daily. [provider]  Active   mupirocin  ointment (BACTROBAN ) 2 % [id number]  Apply 1 Application topically 2 (two) times daily.  Patient taking differently: Apply 1 Application topically 2 (two) times daily as needed.   [name], Marsa PARAS, DO  Active   pantoprazole (PROTONIX) 40 MG tablet [id number]  Take 40 mg by mouth daily.  Patient not taking: Reported on 01/09/2024   [provider]  Active   simvastatin  (ZOCOR ) 20 MG tablet [id number]  TAKE 1 TABLET BY MOUTH ONCE DAILY AT  [address] DO  Active   TRULICITY  1.5 MG/0.5ML EMMANUEL [id number] Yes Inject 1.5 mg into the skin once a week. Edman Marsa PARAS, DO  Active               Assessment/Plan:   Patient plans to take second dose of fluconazole  today, as prescribed by Gynecologist. Recommend patient to then monitor symptoms and contact provider for future symptoms of yeast infection if needed.   Review importance of adequate hydration and genital hygiene with Farxiga.     Diabetes: - Currently controlled - Have reviewed long term cardiovascular and renal outcomes of uncontrolled blood sugar - Encouraged patient to have regular well-balanced meals throughout the day, while controlling carbohydrate portion sizes - Encouraged patient to continue to use blood sugar checks as feedback on dietary choices - Patient to continue to monitor home blood sugar, keep log and bring record with her to medical appointments - Patient to follow up with [employer] as needed for refills of Trulicity  and with [employer] as needed for refills of Farxiga - Will collaborate with CPhT Suzen Mall to request that she support patient with re-enrollment in Trulicity  patient assistance program     HTN/Chronic HFpEF : - Reviewed appropriate blood pressure monitoring technique and reviewed goal blood  pressure - Have reviewed to weigh daily and when to contact PCP/cardiology with weight gain - Reviewed instruction provided by PCP at latest office visit that If has further symptoms of hypotension, such as any lightheadedness, may reduce hydrochlorothiazide  dose by half.  - Recommend to monitor home blood pressure, keep log of results and have this record to review at upcoming medical appointments. Patient to contact provider office sooner if needed for readings outside of established parameters or symptoms     Follow Up Plan: Clinical Pharmacist will follow up with patient by telephone on 05/10/2024 at  10:30 AM    Sharyle Sia, PharmD, [facility] [phone]

## 2024-03-22 NOTE — Patient Instructions (Signed)
Goals Addressed             This Visit's Progress    Pharmacy Goals       Our goal A1c is less than 7%. This corresponds with fasting sugars less than 130 and 2 hour after meal sugars less than 180. Please keep a log of your results when checking your blood sugar  Our goal bad cholesterol, or LDL, is less than 70 . This is why it is important to continue taking your simvastatin  Please check your home blood pressure, keep a log of the results and bring this with you to your medical appointments.  Feel free to call me with any questions or concerns. I look forward to our next call!   Wallace Cullens, PharmD, Para March, CPP Clinical Pharmacist [facility] [phone]

## 2024-03-22 NOTE — Telephone Encounter (Signed)
 PAP: Patient assistance application for Trulicity through [employer] has been mailed to pt's home address on file. Provider portion of application will be faxed to provider's office.

## 2024-03-24 NOTE — Telephone Encounter (Signed)
 Received provider portion PAP application Trulicity  ([employer])

## 2024-03-25 NOTE — Telephone Encounter (Signed)
 Received provider portion PAP application Trulicity  (lilly)

## 2024-03-29 ENCOUNTER — Other Ambulatory Visit: Payer: Self-pay

## 2024-03-29 NOTE — Patient Instructions (Signed)
 Thank you for allowing the Complex Care Management team to participate in your care. It was great speaking with you!  Congratulations on meeting your care management goals! Keep up the great work managing your care. Please do not hesitate to contact Dr. Edman if your health needs change and you require outreach. The care team will gladly assist.   Jackson Acron [facility] RN Care Manager Direct Dial: [phone]  Fax: [phone] Website: [email or web address]

## 2024-03-29 NOTE — Patient Outreach (Signed)
 Complex Care Management   Visit Note  03/29/2024  Name:  Diana Harmon MRN: [id number] DOB: 01/29/1946  Situation: Referral received for Complex Care Management related to Diabetes, Heart Failure and Hypertension. I obtained verbal consent from Patient.  Visit completed with [name] via telephone.  Background:   Past Medical History:  Diagnosis Date   Anemia    Arthritis    legs, knees, back, wrists   Diabetes mellitus without complication (HCC)    Hypertension    Nausea    Patient is [name]    S/P TAVR (transcatheter aortic valve replacement) 11/09/2019   s/p TAVR with a 26 mm Edwards S3U via the TF approach by Dr. Verlin & Bartle   Severe aortic stenosis    SIRS (systemic inflammatory response syndrome) (HCC) 11/2019   Sjogren's disease    Thyroid  disease     Assessment: Patient Reported Symptoms:  Cognitive Cognitive Status: Alert and oriented to person, place, and time, Normal speech and language skills Cognitive/Intellectual Conditions Management [RPT]: None reported or documented in medical history or problem list   Health Maintenance Behaviors: Annual physical exam, Healthy diet, Social activities, Spiritual practice(s), Sleep adequate Healing Pattern: Average Health Facilitated by: Healthy diet, Prayer/meditation, Rest  Neurological Neurological Review of Symptoms: No symptoms reported Neurological Management Strategies: Routine screening Neurological Self-Management Outcome: 4 (good)  HEENT HEENT Symptoms Reported: No symptoms reported HEENT Management Strategies: Coping strategies, Medication therapy, Routine screening HEENT Self-Management Outcome: 4 (good)    Cardiovascular Cardiovascular Symptoms Reported: No symptoms reported Does patient have uncontrolled Hypertension?: No Cardiovascular Management Strategies: Coping strategies, Diet modification, Medical device, Medication therapy, Fluid modification, Routine screening Do You  Have a Working Readable Scale?: Yes Weight: 230 lb (104.3 kg) Cardiovascular Self-Management Outcome: 4 (good)  Respiratory Respiratory Symptoms Reported: No symptoms reported Respiratory Management Strategies: Routine screening Respiratory Self-Management Outcome: 4 (good)  Endocrine Endocrine Symptoms Reported: No symptoms reported Is patient diabetic?: Yes Is patient checking blood sugars at home?: Yes List most recent blood sugar readings, include date and time of day: Reports fasting reading today of 127 Endocrine Self-Management Outcome: 4 (good) Endocrine Comment: A1c within goal  Gastrointestinal Gastrointestinal Symptoms Reported: No symptoms reported Gastrointestinal Management Strategies: Medication therapy Gastrointestinal Self-Management Outcome: 4 (good)  Genitourinary Genitourinary Symptoms Reported: No symptoms reported Genitourinary Self-Management Outcome: 4 (good)  Integumentary Integumentary Symptoms Reported: No symptoms reported Skin Management Strategies: Routine screening Skin Self-Management Outcome: 5 (very good)  Musculoskeletal Musculoskelatal Symptoms Reviewed: No symptoms reported Musculoskeletal Management Strategies: Medical device, Routine screening, Coping strategies, Adequate rest, Medication therapy Musculoskeletal Self-Management Outcome: 4 (good)  Psychosocial Psychosocial Symptoms Reported: No symptoms reported Quality of Family Relationships: supportive, involved, helpful Do you feel physically threatened by others?: No    03/29/2024    PHQ2-9 Depression Screening   Little interest or pleasure in doing things Not at all  Feeling down, depressed, or hopeless Not at all  PHQ-2 - Total Score 0    Today's Vitals   03/29/24 1243  BP: 119/69  Weight: 230 lb (104.3 kg)   Pain Scale: 0-10 Pain Score: 0-No pain  Medications Reviewed Today     Reviewed by Karoline Lima, RN (Registered Nurse) on 03/29/24 at 1043  Med List Status: <None>    Medication Order Taking? Sig Documenting Provider Last Dose Status Informant  Abatacept [facility] ) [id number]  Inject 1.75 Units into the skin once a week. [provider]  Active   ACCU-CHEK GUIDE test strip [id number]  USE TO CHECK  BLOOD SUGAR TWICE DAILY Edman Marsa PARAS, DO  Active   Accu-Chek Softclix Lancets lancets [id number]  USE TO CHECK BLOOD GLUCOSE TWICE DAILY Edman, Marsa PARAS, DO  Active   acetaminophen  (TYLENOL ) 500 MG tablet [id number]  Take 1,000 mg by mouth every 6 (six) hours as needed for moderate pain.  [provider]  Active Child  Artificial Tear Solution (GENTEAL TEARS) 0.1-0.2-0.3 % SOLN [id number]  Place 1 drop into both eyes 3 (three) times daily as needed (dry/irritated eyes.). [provider]  Active Child  aspirin  81 MG chewable tablet [id number]  Chew 1 tablet (81 mg total) by mouth daily. [name]  Active Child  B COMPLEX-C PO [id number]  Take 10 mLs by mouth daily. [provider]  Active Child  baclofen  (LIORESAL ) 10 MG tablet [id number]  TAKE 1 TO 2 TABLETS BY MOUTH TWICE DAILY AS NEEDED FOR MUSCLE SPASM Edman, Marsa PARAS, DO  Active   Blood Glucose Monitoring Suppl (ACCU-CHEK GUIDE) w/Device KIT [id number]  Use to check blood sugar twice per day Edman Marsa PARAS, DO  Active   Cholecalciferol (VITAMIN D3) 125 MCG (5000 UT) TABS [id number]  Take 5,000 Units by mouth daily. [provider]  Active Child  Discontinued 09/05/19 1316   cyanocobalamin  1000 MCG tablet [id number]  Take by mouth every other day. [provider]  Active            Med Note [place], [facility] A   Mon Jan 13, 2023 10:15 AM)    dapagliflozin  propanediol (FARXIGA ) 10 MG TABS tablet [id number]  Take 1 tablet (10 mg total) by mouth daily. Edman Marsa PARAS, DO  Active   Ferrous Sulfate  (IRON ) 325 (65 Fe) MG TABS [id number]  TAKE 1 TABLET BY MOUTH TWICE DAILY WITH A MEAL [name], Marsa PARAS, DO   Active Child  furosemide  (LASIX ) 40 MG tablet [id number]  Take 1 tablet (40 mg total) by mouth daily.  Patient taking differently: Take 40 mg by mouth daily as needed for fluid or edema.   Edman Marsa PARAS, DO  Active   hydrochlorothiazide  (HYDRODIURIL ) 25 MG tablet [id number]  Take 1 tablet (25 mg total) by mouth daily. Edman Marsa PARAS, DO  Active   hydroxychloroquine (PLAQUENIL) 200 MG tablet [id number]  Take 1 tablet by mouth 2 (two) times daily. [provider]  Active   lactobacillus acidophilus (BACID) TABS tablet [id number]  Take 1 tablet by mouth daily as needed (digestive health.). [provider]  Active            Med Note SOILA LYLE BROCKS   Wed Nov 03, 2019  3:06 PM)    Lancets Misc. KIT [id number]  Use to check blood sugar twice daily [provider]  Active   levocetirizine (XYZAL ) 5 MG tablet [id number]  TAKE 1 TABLET BY MOUTH ONCE DAILY IN THE EVENING  Patient not taking: Reported on 01/09/2024   Edman Marsa PARAS, DO  Active   losartan  (COZAAR ) 50 MG tablet [id number]  Take 1 tablet (50 mg total) by mouth daily. Edman Marsa PARAS, DO  Active   magnesium  oxide (MAG-OX) 400 MG tablet [id number]  Take 400 mg by mouth daily. [provider]  Active   mupirocin  ointment (BACTROBAN ) 2 % [id number]  Apply 1 Application topically 2 (two) times daily.  Patient taking differently: Apply 1 Application topically 2 (two) times daily as needed.   Edman Marsa PARAS, DO  Active   pantoprazole (PROTONIX) 40 MG tablet [id number]  Take 40 mg by mouth daily.  Patient not taking: Reported on 01/09/2024   [provider]  Active   simvastatin  (ZOCOR ) 20 MG tablet [id number]  TAKE 1 TABLET BY MOUTH ONCE DAILY AT  [address] DO  Active   TRULICITY  1.5 MG/0.5ML EMMANUEL [id number]  Inject 1.5 mg into the skin once a week. Edman Marsa PARAS, DO  Active             Recommendation:   Continue Current Plan of  Care  Follow Up Plan:   Patient has met all care management goals. Patient has been provided contact information should new needs arise.    Jackson Acron [facility] Population [facility] RN Care Manager Direct Dial: [phone]  Fax: [phone] Website: [email or web address]

## 2024-04-08 NOTE — Telephone Encounter (Signed)
 PAP: Application for Trulicity has been submitted to [employer], via fax

## 2024-04-14 NOTE — Telephone Encounter (Signed)
 PAP: Patient assistance application for Trulicity  has been approved by PAP Companies: [employer] from 05/06/2024 to 05/05/2025. Medication should be delivered to PAP Delivery: Home. For further shipping updates, please contact [employer] at [phone]. Patient ID is: [id number]

## 2024-05-10 ENCOUNTER — Other Ambulatory Visit: Admitting: Pharmacist

## 2024-05-10 DIAGNOSIS — Z7985 Long-term (current) use of injectable non-insulin antidiabetic drugs: Secondary | ICD-10-CM

## 2024-05-10 DIAGNOSIS — E1169 Type 2 diabetes mellitus with other specified complication: Secondary | ICD-10-CM

## 2024-05-10 DIAGNOSIS — I5032 Chronic diastolic (congestive) heart failure: Secondary | ICD-10-CM

## 2024-05-10 DIAGNOSIS — I10 Essential (primary) hypertension: Secondary | ICD-10-CM

## 2024-05-10 NOTE — Progress Notes (Signed)
 "  05/10/2024 Name: Diana Harmon MRN: [id number] DOB: Jul 25, 1945  Chief Complaint  Patient presents with   Medication Management   Medication Assistance    Diana Harmon is a 79 y.o. year old female who presented for a telephone visit.   They were referred to the pharmacist by their PCP for assistance in managing diabetes, hypertension, hyperlipidemia, and medication access.      Subjective:   Care Team: Primary Care Provider: Edman Marsa PARAS, DO; Next Scheduled Visit: 06/24/2024 Rheumatologist: Tobie Lady Plumb, MD Hematologist: Rennie Cindy SAUN[place], MD Cardiologist: Mady Bruckner, MD GI Specialist: Samuella [place], [place] Nephrologist: Marcelino Gales, MD; Next Scheduled Visit: 06/28/2024 Neurologist: Maree Jannett Hering, MD; Next Scheduled Visit: 06/28/2024    Medication Access/Adherence  Current Pharmacy:  [facility] [address] (N), [place] - [address] [place] ([place]) [place] [address] Phone: [phone] Fax: [phone]  [facility] [address] [place] - [address] [place] [address] Phone: [phone] Fax: [phone]  [email or web address] [place], [place] - [address] [facility] [address] [facility] [employer] [place] [address] Phone: [phone] Fax: [phone]  [facility] [place], [place] - [address] [place] [address] Phone: [phone] Fax: [phone]  [id number], [place] - [address] AT [facility] [address] [place] [address] Phone: [phone] Fax: [phone]  [facility] [place], [place] - [address] [place] [address] Phone: [phone] Fax: [phone]   Patient reports affordability concerns with their medications: No Patient reports access/transportation concerns to their  pharmacy: No  Patient reports adherence concerns with their medications:  No     Diabetes:   Current medications:  - Trulicity  1.5 mg weekly             Reports tolerating well; noticing appetite control benefit - Farxiga  10 mg daily    Medications tried in the past: metformin    Reports recent blood sugar readings ranging 103-151 throughout the day   Patient denies hypoglycemic s/sx including dizziness, shakiness, sweating.      Statin therapy: simvastatin  20 mg daily   Current medication access support:  - Enrolled in patient assistance for Trulicity  through [employer] through 05/05/2025 - Enrolled in patient assistance for Farxiga  through [employer] through 05/05/2025     HTN/Chronic HFpEF :   Current medications:  ACEi/ARB/ARNI: losartan  50 mg daily SGLT2 inhibitor: Farxiga  10 mg daily Diuretic regimen: furosemide  40 mg - 1/2 (20 mg) daily as needed HCTZ 25 mg daily   Last checked blood pressure readings: - 1/1: 109/52, HR 80 - 1/2: 133/60, HR 74 - 1/3: 123/54, HR 78; PM: 115/64, HR 83 - 1/4: 120/48, HR 80; PM: 117/51, HR 81 - 1/5: 119/56, HR 73   Denies symptoms of dizziness or lightheadedness recently provided taking positional changes slowly   Denies any signs of swelling     Patient monitors daily weight; reports remaining stable between 235 lbs   Current Physical Activity: Reports completed Physical therapy, but doing leg exercises from PT as able (sometimes limited by leg pain). Notes that she was seen recently by Neurologist related to this leg pain and MRI ordered for further evaluation    Current medication access support:  - Enrolled in patient assistance for Farxiga  through [employer] through 05/05/2025  Objective:  Lab Results  Component Value Date   HGBA1C 6.1 (A) 12/16/2023    Lab Results  Component Value Date   CREATININE 1.34 (H) 02/16/2024   BUN 21 02/16/2024   NA 136 02/16/2024   K 4.1 02/16/2024   CL 99 02/16/2024   CO2 26 02/16/2024     Lab Results  Component Value Date   CHOL 162 06/10/2023   HDL 87 06/10/2023   LDLCALC 61 06/10/2023   TRIG 63 06/10/2023   CHOLHDL 1.9 06/10/2023   BP Readings from Last 3 Encounters:  03/29/24 119/69  02/27/24 (!) 138/59  02/16/24 (!) 139/58   Pulse Readings from Last 3 Encounters:  02/16/24 86  01/09/24 73  12/16/23 80    Medications Reviewed Today     Reviewed by Alana Sharyle LABOR, RPH-CPP (Pharmacist) on 05/10/24 at 1048  Med List Status: <None>   Medication Order Taking? Sig Documenting Provider Last Dose Status Informant  Abatacept [facility] [place]) [id number]  Inject 1.75 Units into the skin once a week. [provider]  Active   ACCU-CHEK GUIDE test strip [id number]  USE TO CHECK BLOOD SUGAR TWICE DAILY Edman Marsa PARAS, DO  Active   Accu-Chek Softclix Lancets lancets [id number]  USE TO CHECK BLOOD GLUCOSE TWICE DAILY Edman, Marsa PARAS, DO  Active   acetaminophen  (TYLENOL ) 500 MG tablet [id number]  Take 1,000 mg by mouth every 6 (six) hours as needed for moderate pain.  [provider]  Active Child  Artificial Tear Solution (GENTEAL TEARS) 0.1-0.2-0.3 % SOLN [id number]  Place 1 drop into both eyes 3 (three) times daily as needed (dry/irritated eyes.). [provider]  Active Child  aspirin  81 MG chewable tablet [id number]  Chew 1 tablet (81 mg total) by mouth daily. [name]  Active Child  B COMPLEX-C PO [id number]  Take 10 mLs by mouth daily. [provider]  Active Child  baclofen  (LIORESAL ) 10 MG tablet [id number]  TAKE 1 TO 2 TABLETS BY MOUTH TWICE DAILY AS NEEDED FOR MUSCLE SPASM Edman, Marsa PARAS, DO  Active   Blood Glucose Monitoring Suppl (ACCU-CHEK GUIDE) w/Device KIT [id number]  Use to check blood sugar twice per day Edman Marsa PARAS, DO  Active   Cholecalciferol (VITAMIN D3) 125 MCG (5000 UT) TABS [id number]  Take 5,000 Units by mouth daily. [provider]  Active Child     Discontinued 09/05/19 1316   cyanocobalamin  1000 MCG tablet [id number]  Take by mouth every other day. [provider]  Active            Med Note [place], [facility]   Mon Jan 13, 2023 10:15 AM)    dapagliflozin  propanediol (FARXIGA ) 10 MG TABS tablet [id number] Yes Take 1 tablet (10 mg total) by mouth daily. Edman Marsa PARAS, DO  Active   Ferrous Sulfate  (IRON ) 325 (65 Fe) MG TABS [id number]  TAKE 1 TABLET BY MOUTH TWICE DAILY WITH A MEAL [name], Marsa PARAS, DO  Active Child  furosemide  (LASIX ) 40 MG tablet [id number]  Take 1 tablet (40 mg total) by mouth daily.  Patient taking differently: Take 40 mg by mouth daily as needed for fluid or edema.   Edman Marsa PARAS, DO  Active   hydrochlorothiazide  (HYDRODIURIL ) 25 MG tablet [id number] Yes Take 1 tablet (25 mg total) by mouth daily. Edman Marsa PARAS, DO  Active   hydroxychloroquine (PLAQUENIL) 200 MG tablet [id number]  Take 1 tablet by mouth 2 (two) times daily. [provider]  Active  lactobacillus acidophilus (BACID) TABS tablet [id number]  Take 1 tablet by mouth daily as needed (digestive health.). [provider]  Active            Med Note SOILA LYLE BROCKS   Wed Nov 03, 2019  3:06 PM)    Lancets Misc. KIT [id number]  Use to check blood sugar twice daily [provider]  Active   levocetirizine (XYZAL ) 5 MG tablet [id number] Yes TAKE 1 TABLET BY MOUTH ONCE DAILY IN THE EVENING  Patient taking differently: Take 2.5 mg by mouth every other day as needed.   Edman Marsa PARAS, DO  Active   losartan  (COZAAR ) 50 MG tablet [id number] Yes Take 1 tablet (50 mg total) by mouth daily. Edman Marsa PARAS, DO  Active   magnesium  oxide (MAG-OX) 400 MG tablet [id number]  Take 400 mg by mouth daily. [provider]  Active   mupirocin  ointment (BACTROBAN ) 2 % [id number]  Apply 1 Application topically 2 (two) times daily.  Patient taking differently: Apply 1 Application topically  2 (two) times daily as needed.   Edman Marsa PARAS, DO  Active   pantoprazole (PROTONIX) 40 MG tablet [id number] Yes Take 40 mg by mouth daily.  Patient taking differently: Take 40 mg by mouth daily as needed.   [provider]  Active   simvastatin  (ZOCOR ) 20 MG tablet [id number] Yes TAKE 1 TABLET BY MOUTH ONCE DAILY AT  [address], DO  Active   TRULICITY  1.5 MG/0.5ML EMMANUEL [id number] Yes Inject 1.5 mg into the skin once a week. Edman Marsa PARAS, DO  Active               Assessment/Plan:   Plans to call today to schedule MRI as ordered by Neurologist  Diabetes: - Currently controlled - Have reviewed long term cardiovascular and renal outcomes of uncontrolled blood sugar - Encouraged patient to have regular well-balanced meals throughout the day, while controlling carbohydrate portion sizes - Encouraged patient to continue to use blood sugar checks as feedback on dietary choices - Patient to continue to monitor home blood sugar, keep log and bring record with her to medical appointments - Patient to follow up with [employer] as needed for refills of Trulicity  and with [employer] as needed for refills of Farxiga      HTN/Chronic HFpEF : - Reviewed appropriate blood pressure monitoring technique and reviewed goal blood pressure - Have reviewed to weigh daily and when to contact PCP/cardiology with weight gain - Reviewed instruction provided by PCP at latest office visit that If has further symptoms of hypotension, such as any lightheadedness, may reduce hydrochlorothiazide  dose by half.  - Recommend to monitor home blood pressure, keep log of results and have this record to review at upcoming medical appointments. Patient to contact provider office sooner if needed for readings outside of established parameters or symptoms  Encourage to bring BP meter with her to upcoming appointment with PCP     Follow Up Plan: Clinical Pharmacist will follow up with  patient by telephone on 09/06/2024 at 10:30 AM    Sharyle Sia, PharmD, [facility] [phone]   "

## 2024-05-10 NOTE — Patient Instructions (Signed)
Goals Addressed             This Visit's Progress    Pharmacy Goals       Our goal A1c is less than 7%. This corresponds with fasting sugars less than 130 and 2 hour after meal sugars less than 180. Please keep a log of your results when checking your blood sugar  Our goal bad cholesterol, or LDL, is less than 70 . This is why it is important to continue taking your simvastatin  Please check your home blood pressure, keep a log of the results and bring this with you to your medical appointments.  Feel free to call me with any questions or concerns. I look forward to our next call!   Wallace Cullens, PharmD, Para March, CPP Clinical Pharmacist [facility] [phone]

## 2024-06-02 ENCOUNTER — Other Ambulatory Visit: Payer: Self-pay | Admitting: Family Medicine

## 2024-06-02 DIAGNOSIS — I10 Essential (primary) hypertension: Secondary | ICD-10-CM

## 2024-06-03 NOTE — Telephone Encounter (Signed)
 Requested Prescriptions  Pending Prescriptions Disp Refills   losartan  (COZAAR ) 50 MG tablet [Pharmacy Med Name: Losartan  Potassium 50 MG Oral Tablet] 90 tablet 0    Sig: Take 1 tablet by mouth once daily     Cardiovascular:  Angiotensin Receptor Blockers Failed - 06/03/2024 10:38 AM      Failed - Cr in normal range and within 180 days    Creatinine  Date Value Ref Range Status  01/09/2024 1.57 (H) 0.44 - 1.00 mg/dL Final   Creat  Date Value Ref Range Status  06/10/2023 1.40 (H) 0.60 - 1.00 mg/dL Final   Creatinine, Ser  Date Value Ref Range Status  02/16/2024 1.34 (H) 0.44 - 1.00 mg/dL Final   Creatinine, Urine  Date Value Ref Range Status  06/10/2023 156 20 - 275 mg/dL Final         Passed - K in normal range and within 180 days    Potassium  Date Value Ref Range Status  02/16/2024 4.1 3.5 - 5.1 mmol/L Final         Passed - Patient is not pregnant      Passed - Last BP in normal range    BP Readings from Last 1 Encounters:  03/29/24 119/69         Passed - Valid encounter within last 6 months    Recent Outpatient Visits           5 months ago Type 2 diabetes mellitus with other specified complication, without long-term current use of insulin  [facility])   [facility] Edman Marsa PARAS, DO   11 months ago Annual physical exam   [place] [facility] [place], Marsa PARAS, DO               hydrochlorothiazide  (HYDRODIURIL ) 25 MG tablet [Pharmacy Med Name: hydroCHLOROthiazide  25 MG Oral Tablet] 90 tablet 0    Sig: Take 1 tablet by mouth once daily     Cardiovascular: Diuretics - Thiazide Failed - 06/03/2024 10:38 AM      Failed - Cr in normal range and within 180 days    Creatinine  Date Value Ref Range Status  01/09/2024 1.57 (H) 0.44 - 1.00 mg/dL Final   Creat  Date Value Ref Range Status  06/10/2023 1.40 (H) 0.60 - 1.00 mg/dL Final   Creatinine, Ser  Date Value Ref Range Status  02/16/2024 1.34 (H)  0.44 - 1.00 mg/dL Final   Creatinine, Urine  Date Value Ref Range Status  06/10/2023 156 20 - 275 mg/dL Final         Passed - K in normal range and within 180 days    Potassium  Date Value Ref Range Status  02/16/2024 4.1 3.5 - 5.1 mmol/L Final         Passed - Na in normal range and within 180 days    Sodium  Date Value Ref Range Status  02/16/2024 136 135 - 145 mmol/L Final  05/03/2020 140 134 - 144 mmol/L Final         Passed - Last BP in normal range    BP Readings from Last 1 Encounters:  03/29/24 119/69         Passed - Valid encounter within last 6 months    Recent Outpatient Visits           5 months ago Type 2 diabetes mellitus with other specified complication, without long-term current use of insulin  (HCC)   [facility] [place] Edman Marsa PARAS, DO   11 months ago Annual physical exam   [place] [facility] [place], Marsa PARAS, [place]

## 2024-06-08 ENCOUNTER — Other Ambulatory Visit: Payer: Self-pay | Admitting: Family Medicine

## 2024-06-08 DIAGNOSIS — E1169 Type 2 diabetes mellitus with other specified complication: Secondary | ICD-10-CM

## 2024-06-09 ENCOUNTER — Ambulatory Visit: Admitting: Emergency Medicine

## 2024-06-09 VITALS — BP 122/60 | Ht 60.0 in | Wt 231.2 lb

## 2024-06-09 DIAGNOSIS — Z0000 Encounter for general adult medical examination without abnormal findings: Secondary | ICD-10-CM

## 2024-06-09 DIAGNOSIS — Z780 Asymptomatic menopausal state: Secondary | ICD-10-CM

## 2024-06-09 NOTE — Progress Notes (Signed)
 "  Chief Complaint  Patient presents with   Medicare Wellness     Subjective:   Diana Harmon is a 79 y.o. female who presents for a Medicare Annual Wellness Visit.  Visit info / Clinical Intake: Medicare Wellness Visit Type:: Subsequent Annual Wellness Visit Persons participating in visit and providing information:: caregiver (with patient present) Engineer, Structural, daughter) Medicare Wellness Visit Mode:: In-person (required for [employer]) Interpreter Needed?: No Pre-visit prep was completed: yes AWV questionnaire completed by patient prior to visit?: no Living arrangements:: with family/others Patient's Overall Health Status Rating: very good Typical amount of pain: (!) a lot Does pain affect daily life?: (!) yes Are you currently prescribed opioids?: no  Dietary Habits and Nutritional Risks How many meals a day?: (!) 1 (1 good meal a day and 2 small meals or snacks) Eats fruit and vegetables daily?: (!) no (eats mostly vegetables) Most meals are obtained by: having others provide food Engineer, Structural, daughter prepares meals) In the last 2 weeks, have you had any of the following?: none Diabetic:: (!) yes Any non-healing wounds?: (!) yes (wound on buttocks, but getting better) How often do you check your BS?: 2 Would you like to be referred to a Nutritionist or for Diabetic Management? : no  Functional Status Activities of Daily Living (to include ambulation/medication): Independent Ambulation: Independent with device- listed below Home Assistive Devices/Equipment: Walker (specify Type); Eyeglasses; Shower/tub chair (rollator) Medication Administration: Independent Home Management (perform basic housework or laundry): Independent Manage your own finances?: yes Primary transportation is: family / friends Concerns about vision?: no *vision screening is required for WTM* Concerns about hearing?: no  Fall Screening Falls in the past year?: 0 Number of falls in past year: 0 Was there  an injury with Fall?: 0 Fall Risk Category Calculator: 0 Patient Fall Risk Level: Low Fall Risk  Fall Risk Patient at Risk for Falls Due to: Impaired balance/gait; Impaired mobility Fall risk Follow up: Falls evaluation completed; Education provided  Home and Transportation Safety: All rugs have non-skid backing?: yes All stairs or steps have railings?: (!) no (1 step without handrail) Grab bars in the bathtub or shower?: (!) no Have non-skid surface in bathtub or shower?: yes Good home lighting?: yes Regular seat belt use?: yes Hospital stays in the last year:: no  Cognitive Assessment Difficulty concentrating, remembering, or making decisions? : yes (memory sometimes) Will 6CIT or Mini Cog be Completed: yes What year is it?: 0 points What month is it?: 0 points Give patient an address phrase to remember (5 components): [address] [place] About what time is it?: 0 points Count backwards from 20 to 1: 0 points Say the months of the year in reverse: 0 points Repeat the address phrase from earlier: 6 points 6 CIT Score: 6 points  Advance Directives (For Healthcare) Does Patient Have a Medical Advance Directive?: Yes Does patient want to make changes to medical advance directive?: No - Patient declined Type of Advance Directive: Healthcare Power of [place]; Living will Copy of Healthcare Power of Attorney in Chart?: Yes - validated most recent copy scanned in chart (See row information) Copy of Living Will in Chart?: Yes - validated most recent copy scanned in chart (See row information)  Reviewed/Updated  Reviewed/Updated: Reviewed All (Medical, Surgical, Family, Medications, Allergies, Care Teams, Patient Goals)    Allergies (verified) Ferumoxytol , Penicillins, Gabapentin , and Other   Current Medications (verified) Outpatient Encounter Medications as of 06/09/2024  Medication Sig   Abatacept (ORENCIA [place]) Inject 1.75 Units  into the skin once a week.   ACCU-CHEK GUIDE  test strip USE TO CHECK BLOOD SUGAR TWICE DAILY   Accu-Chek Softclix Lancets lancets USE TO CHECK BLOOD GLUCOSE TWICE DAILY   acetaminophen  (TYLENOL ) 500 MG tablet Take 1,000 mg by mouth every 6 (six) hours as needed for moderate pain.    Artificial Tear Solution (GENTEAL TEARS) 0.1-0.2-0.3 % SOLN Place 1 drop into both eyes 3 (three) times daily as needed (dry/irritated eyes.).   aspirin  81 MG chewable tablet Chew 1 tablet (81 mg total) by mouth daily.   B COMPLEX-C PO Take 10 mLs by mouth daily.   baclofen  (LIORESAL ) 10 MG tablet TAKE 1 TO 2 TABLETS BY MOUTH TWICE DAILY AS NEEDED FOR MUSCLE SPASM   Blood Glucose Monitoring Suppl (ACCU-CHEK GUIDE) w/Device KIT Use to check blood sugar twice per day   Cholecalciferol (VITAMIN D3) 125 MCG (5000 UT) TABS Take 5,000 Units by mouth daily.   cyanocobalamin  1000 MCG tablet Take by mouth every other day.   dapagliflozin  propanediol (FARXIGA ) 10 MG TABS tablet Take 1 tablet (10 mg total) by mouth daily.   Ferrous Sulfate  (IRON ) 325 (65 Fe) MG TABS TAKE 1 TABLET BY MOUTH TWICE DAILY WITH A MEAL   furosemide  (LASIX ) 40 MG tablet Take 1 tablet (40 mg total) by mouth daily. (Patient taking differently: Take 40 mg by mouth daily. PRN)   hydrochlorothiazide  (HYDRODIURIL ) 25 MG tablet Take 1 tablet by mouth once daily   hydroxychloroquine (PLAQUENIL) 200 MG tablet Take 1 tablet by mouth 2 (two) times daily.   lactobacillus acidophilus (BACID) TABS tablet Take 1 tablet by mouth daily as needed (digestive health.).   Lancets Misc. KIT Use to check blood sugar twice daily   losartan  (COZAAR ) 50 MG tablet Take 1 tablet by mouth once daily   magnesium  oxide (MAG-OX) 400 MG tablet Take 400 mg by mouth daily.   mupirocin  ointment (BACTROBAN ) 2 % Apply 1 Application topically 2 (two) times daily. (Patient taking differently: Apply 1 Application topically 2 (two) times daily. PRN)   pantoprazole (PROTONIX) 40 MG tablet Take 40 mg by mouth daily. (Patient taking  differently: Take 40 mg by mouth daily. PRN)   simvastatin  (ZOCOR ) 20 MG tablet TAKE 1 TABLET BY MOUTH ONCE DAILY AT  6PM   TRULICITY  1.5 MG/0.5ML SOAJ Inject 1.5 mg into the skin once a week.   levocetirizine (XYZAL ) 5 MG tablet TAKE 1 TABLET BY MOUTH ONCE DAILY IN THE EVENING (Patient not taking: Reported on 06/09/2024)   [DISCONTINUED] Cromolyn Sodium (NASAL ALLERGY NA) Place 1 spray into the nose daily as needed (allergies).   No facility-administered encounter medications on file as of 06/09/2024.    History: Past Medical History:  Diagnosis Date   Allergy Not sure long time   Pencillin   Anemia    Arthritis    legs, knees, back, wrists   Diabetes mellitus without complication (HCC)    Hypertension    Nausea    Patient is [name]    S/P TAVR (transcatheter aortic valve replacement) 11/09/2019   s/p TAVR with a 26 mm Edwards S3U via the TF approach by Dr. Verlin & Bartle   Severe aortic stenosis    SIRS (systemic inflammatory response syndrome) (HCC) 11/2019   Sjogren's disease    Thyroid  disease    Past Surgical History:  Procedure Laterality Date   CARDIAC CATHETERIZATION  10/19/2019   CARDIAC VALVE REPLACEMENT  11-07-2019   COLON SURGERY     caudarized  polyps; rectal carcinoid excision ~ 2011   COLONOSCOPY     COLONOSCOPY WITH PROPOFOL  N/A 03/13/2015   Procedure: COLONOSCOPY WITH PROPOFOL ;  Surgeon: Lamar ONEIDA Holmes, MD;  Location[facility] ENDOSCOPY;  Service: Endoscopy;  Laterality: N/A;   ESOPHAGOGASTRODUODENOSCOPY N/A 03/13/2015   Procedure: ESOPHAGOGASTRODUODENOSCOPY (EGD);  Surgeon: Lamar ONEIDA Holmes, MD;  Location: [facility] ENDOSCOPY;  Service: Endoscopy;  Laterality: N/A;   INTRAOPERATIVE TRANSTHORACIC ECHOCARDIOGRAM N/A 11/09/2019   Procedure: INTRAOPERATIVE TRANSTHORACIC ECHOCARDIOGRAM;  Surgeon: Verlin Lonni BIRCH, MD;  Location: [facility] OR;  Service: Open Heart Surgery;  Laterality: N/A;   PARATHYROIDECTOMY  11/20/2004   right superior parathyroidecomy for  primary hyperparathyroidism/parathyroid adenoma   RIGHT/LEFT HEART CATH AND CORONARY ANGIOGRAPHY N/A 10/19/2019   Procedure: RIGHT/LEFT HEART CATH AND CORONARY ANGIOGRAPHY;  Surgeon: Mady Lonni, MD;  Location: [facility] INVASIVE CV LAB;  Service: Cardiovascular;  Laterality: N/A;   THYROIDECTOMY     TRANSCATHETER AORTIC VALVE REPLACEMENT, TRANSFEMORAL N/A 11/09/2019   Procedure: TRANSCATHETER AORTIC VALVE REPLACEMENT, TRANSFEMORAL;  Surgeon: Verlin Lonni BIRCH, MD;  Location: [facility] OR;  Service: Open Heart Surgery;  Laterality: N/A;   Family History  Problem Relation Age of Onset   Parkinson's disease Mother    Heart disease Father    Lupus Sister    Aneurysm Son    Lupus Son    Breast cancer Maternal Aunt        mat great aunt   Social History   Occupational History   Occupation: retired-worked with nandicapped adults  Tobacco Use   Smoking status: Former    Current packs/day: 0.00    Average packs/day: 0.5 packs/day for 20.0 years (10.0 ttl pk-yrs)    Types: Cigarettes    Start date: 09/05/1959    Quit date: 09/05/1979    Years since quitting: 44.7   Smokeless tobacco: Former  Building Services Engineer status: Never Used  Substance and Sexual Activity   Alcohol  use: Not Currently   Drug use: No   Sexual activity: Not on file   Tobacco Counseling Counseling given: Not Answered  SDOH Screenings   Food Insecurity: No Food Insecurity (06/09/2024)  Housing: Low Risk (06/09/2024)  Transportation Needs: No Transportation Needs (06/09/2024)  Utilities: Not At Risk (06/09/2024)  Alcohol  Screen: Low Risk (05/30/2023)  Depression (PHQ2-9): Low Risk (06/09/2024)  Financial Resource Strain: Low Risk  (03/09/2024)   Received from [facility]  Physical Activity: Inactive (06/09/2024)  Social Connections: Moderately Isolated (06/09/2024)  Stress: No Stress Concern Present (06/09/2024)  Tobacco Use: Medium Risk (06/09/2024)  Health Literacy: Adequate Health Literacy (06/09/2024)   See  flowsheets for full screening details  Depression Screen PHQ 2 & 9 Depression Scale- Over the past 2 weeks, how often have you been bothered by any of the following problems? Little interest or pleasure in doing things: 0 Feeling down, depressed, or hopeless (PHQ Adolescent also includes...irritable): 0 PHQ-2 Total Score: 0 Trouble falling or staying asleep, or sleeping too much: 0 Feeling tired or having little energy: 0 Poor appetite or overeating (PHQ Adolescent also includes...weight loss): 0 Feeling bad about yourself - or that you are a failure or have let yourself or your family down: 0 Trouble concentrating on things, such as reading the newspaper or watching television (PHQ Adolescent also includes...like school work): 0 Moving or speaking so slowly that other people could have noticed. Or the opposite - being so fidgety or restless that you have been moving around a lot more than usual: 0 Thoughts that you would be  better off dead, or of hurting yourself in some way: 0 PHQ-9 Total Score: 0 If you checked off any problems, how difficult have these problems made it for you to do your work, take care of things at home, or get along with other people?: Not difficult at all  Depression Treatment Depression Interventions/Treatment : EYV7-0 Score <4 Follow-up Not Indicated     Goals Addressed               This Visit's Progress     eat healthier, possibly plant based diet (pt-stated)               Objective:    Today's Vitals   06/09/24 1007  BP: 122/60  Weight: 231 lb 3.2 oz (104.9 kg)  Height: 5' (1.524 m)   Body mass index is 45.15 kg/m.  Hearing/Vision screen Hearing Screening - Comments:: Denies hearing loss  Vision Screening - Comments:: UTD w/ Dr. Manus Edison Immunizations and Health Maintenance Health Maintenance  Topic Date Due   Bone Density Scan  05/19/2019   Diabetic kidney evaluation - Urine ACR  12/08/2023   FOOT EXAM  06/16/2024   HEMOGLOBIN  A1C  06/17/2024   COVID-19 Vaccine (5 - Moderna risk 2025-26 season) 08/30/2024   OPHTHALMOLOGY EXAM  11/09/2024   Diabetic kidney evaluation - eGFR measurement  02/15/2025   Medicare Annual Wellness (AWV)  06/09/2025   DTaP/Tdap/Td (2 - Td or Tdap) 03/01/2034   Pneumococcal Vaccine: 50+ Years  Completed   Influenza Vaccine  Completed   Hepatitis C Screening  Completed   Zoster Vaccines- Shingrix   Completed   Meningococcal B Vaccine  Aged Out   Mammogram  Discontinued   Colonoscopy  Discontinued        Assessment/Plan:  This is a routine wellness examination for Diana Harmon.  Patient Care Team: Edman Marsa PARAS, DO as PCP - General (Family Medicine) Edison Manus DASEN., MD (Ophthalmology) End, Lonni, MD as Consulting Physician (Cardiology) Rennie Cindy SAUNDERS, MD as Consulting Physician (Hematology and Oncology) Alana, Sharyle LABOR, RPH-CPP (Pharmacist) Tobie Lady POUR, MD as Consulting Physician (Rheumatology) Maree Jannett POUR, MD as Consulting Physician (Neurology) Marcelino Gales, MD (Nephrology) Samuella Twyla HERO, PA-C (Gastroenterology)  I have personally reviewed and noted the following in the patients chart:   Medical and social history Use of alcohol , tobacco or illicit drugs  Current medications and supplements including opioid prescriptions. Functional ability and status Nutritional status Physical activity Advanced directives List of other physicians Hospitalizations, surgeries, and ER visits in previous 12 months Vitals Screenings to include cognitive, depression, and falls Referrals and appointments  Orders Placed This Encounter  Procedures   DG Bone Density    Standing Status:   Future    Expected Date:   06/10/2024    Expiration Date:   06/09/2025    Reason for Exam (SYMPTOM  OR DIAGNOSIS REQUIRED):   postmenopausal estrogen deficiency    Preferred imaging location?:   [facility]   In addition, I have reviewed and discussed with patient  certain preventive protocols, quality metrics, and best practice recommendations. A written personalized care plan for preventive services as well as general preventive health recommendations were provided to patient.   Vina Ned, CMA   06/09/2024   Return in 53 weeks (on 06/15/2025) for Medicare Annual Wellness Visit.  After Visit Summary: (In Person-Declined) Patient declined AVS at this time.  Nurse Notes:  6 CIT Score - 6 Placed order for DEXA scan Will be due for DM  foot exam at next OV on 06/24/24 Screening colonoscopy and MMG no longer recommended due to age. Declined DM & Nutrition education referral "

## 2024-06-09 NOTE — Telephone Encounter (Signed)
 Requested medication (s) are due for refill today - expired Rx  Requested medication (s) are on the active medication list -yes  Future visit scheduled -yes  Last refill: 08/20/22 #200 3RF  Notes to clinic: expired Rx, no protocol listed  Requested Prescriptions  Pending Prescriptions Disp Refills   ACCU-CHEK GUIDE TEST test strip [Pharmacy Med Name: Accu-Chek Guide Test In Vitro Strip] 200 each 0    Sig: USE TO CHECK BLOOD SUGAR TWICE DAILY     There is no refill protocol information for this order       Requested Prescriptions  Pending Prescriptions Disp Refills   ACCU-CHEK GUIDE TEST test strip [Pharmacy Med Name: Accu-Chek Guide Test In Vitro Strip] 200 each 0    Sig: USE TO CHECK BLOOD SUGAR TWICE DAILY     There is no refill protocol information for this order

## 2024-06-09 NOTE — Patient Instructions (Signed)
 Diana Harmon,  Thank you for taking the time for your Medicare Wellness Visit. I appreciate your continued commitment to your health goals. Please review the care plan we discussed, and feel free to reach out if I can assist you further.  Please note that Annual Wellness Visits do not include a physical exam. Some assessments may be limited, especially if the visit was conducted virtually. If needed, we may recommend an in-person follow-up with your provider.  Ongoing Care Seeing your primary care provider every 3 to 6 months helps us  monitor your health and provide consistent, personalized care.   Referrals If a referral was made during today's visit and you haven't received any updates within two weeks, please contact the referred provider directly to check on the status.  Recommended Screenings:  Please call to schedule your bone density scan:  [facility] Address: [address], [place], [place] Phone: [phone]   Health Maintenance  Topic Date Due   Osteoporosis screening with Bone Density Scan  05/19/2019   Kidney health urinalysis for diabetes  12/08/2023   Medicare Annual Wellness Visit  05/29/2024   Complete foot exam   06/16/2024   Hemoglobin A1C  06/17/2024   COVID-19 Vaccine (5 - Moderna risk 2025-26 season) 08/30/2024   Eye exam for diabetics  11/09/2024   Yearly kidney function blood test for diabetes  02/15/2025   DTaP/Tdap/Td vaccine (2 - Td or Tdap) 03/01/2034   Pneumococcal Vaccine for age over 59  Completed   Flu Shot  Completed   Hepatitis C Screening  Completed   Zoster (Shingles) Vaccine  Completed   Meningitis B Vaccine  Aged Out   Breast Cancer Screening  Discontinued   Colon Cancer Screening  Discontinued       06/09/2024   10:26 AM  Advanced Directives  Does Patient Have a Medical Advance Directive? Yes  Type of Estate Agent of [place];Living will  Does patient want to make  changes to medical advance directive? No - Patient declined  Copy of Healthcare Power of Attorney in Chart? Yes - validated most recent copy scanned in chart (See row information)    Vision: Annual vision screenings are recommended for early detection of glaucoma, cataracts, and diabetic retinopathy. These exams can also reveal signs of chronic conditions such as diabetes and high blood pressure.  Dental: Annual dental screenings help detect early signs of oral cancer, gum disease, and other conditions linked to overall health, including heart disease and diabetes.  Please see the attached documents for additional preventive care recommendations.

## 2024-06-17 ENCOUNTER — Other Ambulatory Visit

## 2024-06-24 ENCOUNTER — Encounter: Admitting: Family Medicine

## 2024-07-09 ENCOUNTER — Ambulatory Visit: Admitting: Internal Medicine

## 2024-07-09 ENCOUNTER — Other Ambulatory Visit

## 2024-09-06 ENCOUNTER — Other Ambulatory Visit

## 2025-06-15 ENCOUNTER — Ambulatory Visit
